# Patient Record
Sex: Female | Born: 1985 | Hispanic: Yes | Marital: Married | Smoking: Never smoker
Health system: Southern US, Community
[De-identification: ages and names within clinical notes are randomized; demographics above are authoritative.]

## PROBLEM LIST (undated history)

## (undated) DIAGNOSIS — O139 Gestational [pregnancy-induced] hypertension without significant proteinuria, unspecified trimester: Secondary | ICD-10-CM

## (undated) DIAGNOSIS — J129 Viral pneumonia, unspecified: Secondary | ICD-10-CM

## (undated) DIAGNOSIS — F32A Depression, unspecified: Secondary | ICD-10-CM

## (undated) DIAGNOSIS — R55 Syncope and collapse: Secondary | ICD-10-CM

## (undated) DIAGNOSIS — M1990 Unspecified osteoarthritis, unspecified site: Secondary | ICD-10-CM

## (undated) DIAGNOSIS — I509 Heart failure, unspecified: Secondary | ICD-10-CM

## (undated) DIAGNOSIS — E119 Type 2 diabetes mellitus without complications: Secondary | ICD-10-CM

## (undated) DIAGNOSIS — B348 Other viral infections of unspecified site: Secondary | ICD-10-CM

## (undated) DIAGNOSIS — G43909 Migraine, unspecified, not intractable, without status migrainosus: Secondary | ICD-10-CM

## (undated) DIAGNOSIS — G8929 Other chronic pain: Secondary | ICD-10-CM

## (undated) DIAGNOSIS — M549 Dorsalgia, unspecified: Secondary | ICD-10-CM

## (undated) DIAGNOSIS — Z789 Other specified health status: Secondary | ICD-10-CM

## (undated) DIAGNOSIS — G629 Polyneuropathy, unspecified: Secondary | ICD-10-CM

## (undated) DIAGNOSIS — R000 Tachycardia, unspecified: Secondary | ICD-10-CM

## (undated) HISTORY — DX: Depression, unspecified: F32.A

## (undated) HISTORY — DX: Other viral infections of unspecified site: B34.8

## (undated) HISTORY — DX: Tachycardia, unspecified: R00.0

## (undated) HISTORY — DX: Viral pneumonia, unspecified: J12.9

## (undated) HISTORY — DX: Unspecified osteoarthritis, unspecified site: M19.90

## (undated) HISTORY — DX: Syncope and collapse: R55

## (undated) HISTORY — PX: TUBAL LIGATION: SHX77

---
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
## 2006-07-10 ENCOUNTER — Inpatient Hospital Stay (HOSPITAL_COMMUNITY): Admission: AD | Admit: 2006-07-10 | Discharge: 2006-07-10 | Payer: Self-pay | Admitting: Obstetrics & Gynecology

## 2006-07-23 ENCOUNTER — Inpatient Hospital Stay (HOSPITAL_COMMUNITY): Admission: AD | Admit: 2006-07-23 | Discharge: 2006-07-23 | Payer: Self-pay | Admitting: Obstetrics & Gynecology

## 2007-08-19 HISTORY — PX: CERVICAL BIOPSY  W/ LOOP ELECTRODE EXCISION: SUR135

## 2007-08-19 HISTORY — PX: OTHER SURGICAL HISTORY: SHX169

## 2007-11-23 ENCOUNTER — Inpatient Hospital Stay (HOSPITAL_COMMUNITY): Admission: AD | Admit: 2007-11-23 | Discharge: 2007-11-23 | Payer: Self-pay | Admitting: Gynecology

## 2008-02-25 ENCOUNTER — Inpatient Hospital Stay (HOSPITAL_COMMUNITY): Admission: AD | Admit: 2008-02-25 | Discharge: 2008-02-25 | Payer: Self-pay | Admitting: Obstetrics & Gynecology

## 2008-02-28 ENCOUNTER — Ambulatory Visit (HOSPITAL_COMMUNITY): Admission: RE | Admit: 2008-02-28 | Discharge: 2008-02-28 | Payer: Self-pay | Admitting: Obstetrics & Gynecology

## 2008-03-13 ENCOUNTER — Ambulatory Visit (HOSPITAL_COMMUNITY): Admission: RE | Admit: 2008-03-13 | Discharge: 2008-03-13 | Payer: Self-pay | Admitting: Obstetrics & Gynecology

## 2008-06-05 ENCOUNTER — Other Ambulatory Visit: Admission: RE | Admit: 2008-06-05 | Discharge: 2008-06-05 | Payer: Self-pay | Admitting: Obstetrics & Gynecology

## 2008-06-23 ENCOUNTER — Inpatient Hospital Stay (HOSPITAL_COMMUNITY): Admission: AD | Admit: 2008-06-23 | Discharge: 2008-06-24 | Payer: Self-pay | Admitting: Obstetrics & Gynecology

## 2008-06-24 ENCOUNTER — Inpatient Hospital Stay (HOSPITAL_COMMUNITY): Admission: AD | Admit: 2008-06-24 | Discharge: 2008-06-25 | Payer: Self-pay | Admitting: Obstetrics and Gynecology

## 2008-06-24 ENCOUNTER — Ambulatory Visit: Payer: Self-pay | Admitting: Advanced Practice Midwife

## 2008-06-28 ENCOUNTER — Inpatient Hospital Stay (HOSPITAL_COMMUNITY): Admission: AD | Admit: 2008-06-28 | Discharge: 2008-06-28 | Payer: Self-pay | Admitting: Obstetrics & Gynecology

## 2008-07-11 ENCOUNTER — Observation Stay (HOSPITAL_COMMUNITY): Admission: RE | Admit: 2008-07-11 | Discharge: 2008-07-11 | Payer: Self-pay | Admitting: Obstetrics and Gynecology

## 2008-07-13 ENCOUNTER — Inpatient Hospital Stay (HOSPITAL_COMMUNITY): Admission: AD | Admit: 2008-07-13 | Discharge: 2008-07-15 | Payer: Self-pay | Admitting: Obstetrics and Gynecology

## 2008-07-13 ENCOUNTER — Ambulatory Visit: Payer: Self-pay | Admitting: Advanced Practice Midwife

## 2008-07-13 ENCOUNTER — Encounter: Payer: Self-pay | Admitting: Obstetrics and Gynecology

## 2008-09-14 ENCOUNTER — Encounter: Payer: Self-pay | Admitting: Obstetrics and Gynecology

## 2008-09-14 ENCOUNTER — Ambulatory Visit (HOSPITAL_COMMUNITY): Admission: RE | Admit: 2008-09-14 | Discharge: 2008-09-14 | Payer: Self-pay | Admitting: Obstetrics and Gynecology

## 2008-10-09 ENCOUNTER — Ambulatory Visit: Payer: Self-pay | Admitting: Surgery

## 2008-10-09 ENCOUNTER — Emergency Department (HOSPITAL_COMMUNITY): Admission: EM | Admit: 2008-10-09 | Discharge: 2008-10-09 | Payer: Self-pay | Admitting: Emergency Medicine

## 2008-10-09 ENCOUNTER — Encounter (INDEPENDENT_AMBULATORY_CARE_PROVIDER_SITE_OTHER): Payer: Self-pay | Admitting: Emergency Medicine

## 2009-12-02 ENCOUNTER — Emergency Department (HOSPITAL_COMMUNITY): Admission: EM | Admit: 2009-12-02 | Discharge: 2009-12-02 | Payer: Self-pay | Admitting: Emergency Medicine

## 2010-03-18 ENCOUNTER — Emergency Department (HOSPITAL_COMMUNITY): Admission: EM | Admit: 2010-03-18 | Discharge: 2010-03-18 | Payer: Self-pay | Admitting: Emergency Medicine

## 2010-03-19 ENCOUNTER — Emergency Department (HOSPITAL_COMMUNITY): Admission: EM | Admit: 2010-03-19 | Discharge: 2010-03-20 | Payer: Self-pay | Admitting: Emergency Medicine

## 2010-05-23 ENCOUNTER — Emergency Department (HOSPITAL_COMMUNITY): Admission: EM | Admit: 2010-05-23 | Discharge: 2010-05-24 | Payer: Self-pay | Admitting: Emergency Medicine

## 2010-10-30 LAB — RAPID STREP SCREEN (MED CTR MEBANE ONLY): Streptococcus, Group A Screen (Direct): NEGATIVE

## 2010-12-02 LAB — CBC
HCT: 38.3 % (ref 36.0–46.0)
Hemoglobin: 13.2 g/dL (ref 12.0–15.0)
MCHC: 34.5 g/dL (ref 30.0–36.0)
MCV: 83.4 fL (ref 78.0–100.0)
Platelets: 346 10*3/uL (ref 150–400)
RBC: 4.59 MIL/uL (ref 3.87–5.11)
RDW: 13 % (ref 11.5–15.5)
WBC: 7.6 10*3/uL (ref 4.0–10.5)

## 2010-12-02 LAB — HCG, QUANTITATIVE, PREGNANCY: hCG, Beta Chain, Quant, S: <2 m[IU]/mL (ref <5)

## 2010-12-31 NOTE — Discharge Summary (Signed)
NAMEGLENETTA, Cheryl Mercer              ACCOUNT NO.:  [id number]   MEDICAL RECORD NO.:  [id number]          PATIENT TYPE:  OBV   LOCATION:  9168                          FACILITY:  [facility]   PHYSICIAN:  Tilda Burrow, M.D. DATE OF BIRTH:  Sep 11, 1985   DATE OF ADMISSION:  07/11/2008  DATE OF DISCHARGE:  07/11/2008                               DISCHARGE SUMMARY   ADMITTING DIAGNOSES:  Gestational diabetes at 40 weeks.  Based on the  third trimester ultrasound, unknown last menstrual period.   DISCHARGE DIAGNOSES:  Pregnancy at 37 weeks 6 days by 20-week ultrasound  and cancellation of induction.   HOSPITAL SUMMARY:  This 25 year old female was admitted on July 11, 2008, at 40 weeks' gestation by the limited ultrasound criteria  available at [facility] dating on a 30-week ultrasound in the  absence of any ultrasound, any menstrual criteria to use and the absence  of early prenatal care.   The patient was sent to Labor and Delivery where exam noted term size  fetus with cervical exam 3 cm, 70%, -2, cervix soft, vertex  presentation.  See HPI for details.  The patient had a chart review,  which revealed a 20-week ultrasound at [facility], which would  place her 37 weeks 6 days.  Given the potential possible prematurity  that would be attributable to iatrogenic causes for induction at this  time.  We have cancelled plans for induction and will reassess the  patient in 1-week.  The patient was discharged home with followup  instructions, [facility] within 1 week.       Tilda Burrow, M.D.  Electronically Signed     JVF/MEDQ  D:  08/30/2008  T:  08/31/2008  Job:  [id number]

## 2010-12-31 NOTE — Op Note (Signed)
Cheryl Mercer, Cheryl Mercer              ACCOUNT NO.:  [id number]   MEDICAL RECORD NO.:  [id number]          PATIENT TYPE:  AMB   LOCATION:  DAY                           FACILITY:  [facility]   PHYSICIAN:  Tilda Burrow, M.D. DATE OF BIRTH:  04/29/1986   DATE OF PROCEDURE:  09/14/2008  DATE OF DISCHARGE:                               OPERATIVE REPORT   PREOPERATIVE DIAGNOSES:  Elective sterilization, cervical dysplasia  cervical intraepithelial neoplasia 2.   POSTOPERATIVE DIAGNOSES:  Elective sterilization, cervical dysplasia  cervical intraepithelial neoplasia 2.   PROCEDURE:  1. Laparoscopic tubal sterilization with Falope rings.  2. Loop electrocautery excision procedure conization of the cervix.   SURGEON:  Tilda Burrow, M.D.   ASSISTANTAmie Critchley, CST   ANESTHESIA:  General.   COMPLICATIONS:  None.   FINDINGS:  Normal tubes bilaterally, small cervix, and small transition  zone on the cervix.   DESCRIPTION OF PROCEDURE:  The patient was taken to the operating room,  prepped and draped in the usual standard fashion with legs in low  lithotomy leg supports after general anesthesia was introduced without  difficulty.  The bladder was in-and-out catheterized and Hulka tenaculum  attached to the cervix for uterine manipulation.  An infraumbilical,  vertical, 1-cm skin incision was made as well as a transverse suprapubic  1-cm incision.  A Veress needle was used to achieve pneumoperitoneum  through the umbilical incision while being careful to orient the needle  toward the pelvis while elevating the abdominal wall by manual  elevation.  Water droplet test was used to confirm intraperitoneal  placement.   Pneumoperitoneum was achieved easily under 8-to-10 mm of intra-abdominal  pressure; and the laparoscopic trocar was introduced, a 5-mm blunt  tipped trocar, under direct visualization using the video camera.  Peritoneal cavity was entered without difficulty.  Inspection of  the  anterior surfaces of the abdominal contents showed no evidence of injury  or bleeding.  Attention was directed to the pelvis.  Findings were as  described above.   Attention was first directed to the left fallopian tube which was  elevated, identified to its fimbriated end and grasped in its midportion  with Falope ring applier.  Falope ring applied and then the tube  infiltrated with Marcaine solution 0.25% using a 22-gauge spinal needle  percutaneously applied.   Attention was then directed to the right fallopian tube where a similar  procedure was performed.  Photo documentation of the ring placements was  performed; 120 cc of saline was instilled into the abdomen; deflation of  CO2 performed; instruments removed and subcuticular 4-0 Dexon closure of  skin incisions performed.  The rest of the surgical instruments were  removed; Steri-Strips placed.  The patient allowed to awaken and go to  recovery room in standard fashion.   ADDENDUM  LEEP conization.   Prior to allowing the patient to awaken, we removed all the laparoscopic  equipment out of the way, a speculum was inserted into the vagina along  visualization of the cervix, which was created with Lugol's solution to  identify the squamocolumnar junction.  Paracervical  block with Marcaine  solution was injected x 5 mL on each side, then the cervix trimmed with  a 2-cm x 0.8-cm wire LEEP conization device under a cautery setting at  40 watts, removing the cervical specimen in one swipe.  The posterior  lip was deeply trimmed, as the anterior portion was but specimen was  sent intact in one piece.  A small amount of bleeding was controlled  with Monsel solution.  The patient went to recovery room in good  condition.      Tilda Burrow, M.D.  Electronically Signed     JVF/MEDQ  D:  09/14/2008  T:  09/15/2008  Job:  [id number]   cc:   [facility]

## 2010-12-31 NOTE — H&P (Signed)
NAMETRUTH, WOLAVER              ACCOUNT NO.:  [id number]   MEDICAL RECORD NO.:  [id number]          PATIENT TYPE:  AMB   LOCATION:  DAY                           FACILITY:  [facility]   PHYSICIAN:  Tilda Burrow, M.D. 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   HISTORY OF PRESENT ILLNESS:  This 25 year old female gravida 4, para 4  status post vaginal delivery in November has been followed up in our  office.  She has had colposcopic evaluation of her previously noted  abnormal Pap smear and the cervical biopsies of the posterior lip of the  cervix have shown moderate dysplasia of the posterior lip of the cervix.  Endocervical curettings were benign.   Ms.  Iran Planas has already requested permanent sterilization.  She signed  Medicaid tubal sterilization forms in November after turning age 48, has  confirmed once again her desire for permanent sterilization.  She has  overcome her anxiety about being put to sleep, and desires to proceed at  this time, having spoken to someone who has recently undergone similar  surgery and did beautifully and reassured her of the easy anesthetic  experience.  The patient desires to proceed with sterilization.  She  understands it is a permanent procedure and intended to be irreversible.   PAST MEDICAL HISTORY:  benign.   SURGICAL HISTORY:  Negative.   ALLERGIES:  None.   SOCIAL HISTORY:  Single, lives with children.  Current partner is Earley Abide.   PHYSICAL EXAMINATION:  VITAL SIGNS:  Height 5 feet 1 inch, weight 188.6,  blood pressure 150/82.  HEENT:  Pupils equal, round, reactive.  Extraocular intact.  NECK:  Supple.  CHEST:  Clear to auscultation.  ABDOMEN:  Nontender, moderate obesity present.  EXTERNAL GENITALIA:  Normal  female, postpartum state, uterus anteflexed,  normal size, shape and contour without masses.  EXTREMITIES: Normal.   PLAN:  LPS Falope rings September 14, 2008, preop laboratory evaluation  and paperwork at 3:15 p.m. on September 12, 2008.      Tilda Burrow, M.D.  Electronically Signed     JVF/MEDQ  D:  09/07/2008  T:  09/07/2008  Job:  [id number]   cc:   [facility]

## 2011-01-03 NOTE — Group Therapy Note (Signed)
   NAME:  Cheryl Mercer, Cheryl Mercer                        ACCOUNT NO.:  [id number]   MEDICAL RECORD NO.:  [id number]                   PATIENT TYPE:  OUT   LOCATION:  [facility]                           FACILITY:  [facility]   PHYSICIAN:  Argentina Donovan, M.D.                   DATE OF BIRTH:  Dec 27, 1985   DATE OF SERVICE:  02/16/2003                                    CLINIC NOTE   HISTORY OF PRESENT ILLNESS:  The patient is a 25 year old gravida 1 para 1-0-  0-1 with baby 51 months old who comes in having gained 30 pounds since  delivery and now weighs 172, who had her May period come two weeks late and  has missed her June period, and thinks she could be pregnant although she  has no signs of breast tenderness or nausea or any other signs of pregnancy  except for a missed period.  Urine pregnancy test is negative.   PHYSICAL EXAMINATION:  PELVIC:  External genitalia is normal.  BUS is within  normal limits.  The vagina is clean and well rugated.  The cervix is clean  and parous and the uterus is of normal size.  The adnexa could not be  outlined because of the habitus of the patient.  BREASTS:  The breasts are soft with no dominant masses.  NECK:  Thyroid is normal with no significant nodules.   We discussed with the patient the use of contraception.  She desires Depo-  Provera which will be given before her discharge today.  Also, she had a  foamy white vaginal discharge.  A wet prep was done which was negative,  whiff test negative for yeast, negative for clue cells, and negative for  trichomonas.   IMPRESSION:  1. Normal gynecologic exam.  2. Amenorrhea.  3. To be put on Depo-Provera.                                               Argentina Donovan, M.D.    PR/MEDQ  D:  02/16/2003  T:  02/16/2003  Job:  [id number]

## 2011-01-03 NOTE — Discharge Summary (Signed)
Cheryl Mercer, Cheryl Mercer              ACCOUNT NO.:  [id number]   MEDICAL RECORD NO.:  [id number]          PATIENT TYPE:  OBV   LOCATION:  9165                          [facility]ACILITY[facility]   PHYSICIAN:  Phil D. Okey Dupre, M.D.     DATE OF BIRTH:  June 12, 1986   DATE OF ADMISSION:  02/26/2005  DATE OF DISCHARGE:  02/27/2005                                 DISCHARGE SUMMARY   ADMISSION DIAGNOSIS:  Contractions.   DISCHARGE DIAGNOSIS:  Intrauterine pregnancy at 37 weeks and 1 day.   PROCEDURE:  None.   HISTORY OF PRESENT ILLNESS:  This is a 25 year old female gravida 2, para 1-  0-0-1, at 37 weeks and 1 day.  The patient presented to the MAU with  irregular contractions.  She was 3 to 4 cm/50%/-2 to -3.  The patient was  initially walked for 2 hours and was reexamined.  The patient's cervix had  not changed, but the patient insisted that she be followed.   HOSPITAL COURSE:  The patient was admitted to the hospital for further  observation.  The patient's cervix did not change while in the hospital.  At  discharge, her cervix was still 3 to 4/50/-3.  The patient was not in labor.  The patient was discharged home with labor precautions and told to keep her  appointment with [facility].  The baby's strip was reassuring at all  times during the hospital stay.  The patient voiced understanding of plan.   DISCHARGE LABORATORY DATA:  None.   DISCHARGE MEDICATIONS:  None.   ALLERGIES:  No known drug allergies.       HG/MEDQ  D:  02/27/2005  T:  02/27/2005  Job:  [id number]

## 2011-04-21 ENCOUNTER — Emergency Department (HOSPITAL_COMMUNITY)
Admission: EM | Admit: 2011-04-21 | Discharge: 2011-04-21 | Disposition: A | Discharge status: Home / Self Care | Payer: Self-pay | Attending: Emergency Medicine | Admitting: Emergency Medicine

## 2011-04-21 ENCOUNTER — Emergency Department (HOSPITAL_COMMUNITY): Payer: Self-pay

## 2011-04-21 DIAGNOSIS — H9209 Otalgia, unspecified ear: Secondary | ICD-10-CM | POA: Insufficient documentation

## 2011-04-21 DIAGNOSIS — R221 Localized swelling, mass and lump, neck: Secondary | ICD-10-CM | POA: Insufficient documentation

## 2011-04-21 DIAGNOSIS — R6889 Other general symptoms and signs: Secondary | ICD-10-CM | POA: Insufficient documentation

## 2011-04-21 DIAGNOSIS — R509 Fever, unspecified: Secondary | ICD-10-CM | POA: Insufficient documentation

## 2011-04-21 DIAGNOSIS — R220 Localized swelling, mass and lump, head: Secondary | ICD-10-CM | POA: Insufficient documentation

## 2011-04-21 DIAGNOSIS — R070 Pain in throat: Secondary | ICD-10-CM | POA: Insufficient documentation

## 2011-04-21 DIAGNOSIS — R0989 Other specified symptoms and signs involving the circulatory and respiratory systems: Secondary | ICD-10-CM | POA: Insufficient documentation

## 2011-04-21 DIAGNOSIS — R0609 Other forms of dyspnea: Secondary | ICD-10-CM | POA: Insufficient documentation

## 2011-04-21 DIAGNOSIS — M542 Cervicalgia: Secondary | ICD-10-CM | POA: Insufficient documentation

## 2011-04-21 DIAGNOSIS — J36 Peritonsillar abscess: Secondary | ICD-10-CM | POA: Insufficient documentation

## 2011-04-21 DIAGNOSIS — R599 Enlarged lymph nodes, unspecified: Secondary | ICD-10-CM | POA: Insufficient documentation

## 2011-04-21 DIAGNOSIS — R630 Anorexia: Secondary | ICD-10-CM | POA: Insufficient documentation

## 2011-04-21 LAB — CBC
HCT: 34.4 % — ABNORMAL LOW (ref 36.0–46.0)
Hemoglobin: 12.1 g/dL (ref 12.0–15.0)
MCH: 29.2 pg (ref 26.0–34.0)
MCHC: 35.2 g/dL (ref 30.0–36.0)
MCV: 82.9 fL (ref 78.0–100.0)
RDW: 12 % (ref 11.5–15.5)
WBC: 18.7 10*3/uL — ABNORMAL HIGH (ref 4.0–10.5)

## 2011-04-21 LAB — POCT I-STAT, CHEM 8
Calcium, Ion: 1.09 mmol/L — ABNORMAL LOW (ref 1.12–1.32)
Chloride: 102 mEq/L (ref 96–112)
Creatinine, Ser: 0.5 mg/dL (ref 0.50–1.10)
Glucose, Bld: 207 mg/dL — ABNORMAL HIGH (ref 70–99)
HCT: 36 % (ref 36.0–46.0)
Hemoglobin: 12.2 g/dL (ref 12.0–15.0)
Potassium: 3.1 mEq/L — ABNORMAL LOW (ref 3.5–5.1)
Sodium: 138 mEq/L (ref 135–145)

## 2011-04-21 LAB — DIFFERENTIAL
Basophils Absolute: 0 10*3/uL (ref 0.0–0.1)
Basophils Relative: 0 % (ref 0–1)
Eosinophils Absolute: 0.5 10*3/uL (ref 0.0–0.7)
Eosinophils Relative: 3 % (ref 0–5)
Lymphs Abs: 3.3 10*3/uL (ref 0.7–4.0)
Monocytes Absolute: 1.5 10*3/uL — ABNORMAL HIGH (ref 0.1–1.0)
Monocytes Relative: 8 % (ref 3–12)
Neutro Abs: 13.4 10*3/uL — ABNORMAL HIGH (ref 1.7–7.7)
Neutrophils Relative %: 72 % (ref 43–77)

## 2011-04-21 LAB — RAPID STREP SCREEN (MED CTR MEBANE ONLY): Streptococcus, Group A Screen (Direct): POSITIVE — AB

## 2011-04-21 LAB — POCT PREGNANCY, URINE: Preg Test, Ur: NEGATIVE

## 2011-04-21 MED ORDER — IOHEXOL 300 MG/ML  SOLN: 75.0000 mL | Freq: Once | INTRAMUSCULAR | Status: DC | PRN

## 2011-05-13 LAB — URINALYSIS, ROUTINE W REFLEX MICROSCOPIC
Bilirubin Urine: NEGATIVE
Glucose, UA: NEGATIVE
Hgb urine dipstick: NEGATIVE
Ketones, ur: NEGATIVE
Nitrite: NEGATIVE
Protein, ur: NEGATIVE
Specific Gravity, Urine: 1.02
Urobilinogen, UA: 0.2
pH: 5.5

## 2011-05-13 LAB — WET PREP, GENITAL
Trich, Wet Prep: NONE SEEN
Yeast Wet Prep HPF POC: NONE SEEN

## 2011-05-13 LAB — POCT PREGNANCY, URINE
Operator id: 251141
Preg Test, Ur: POSITIVE

## 2011-05-13 LAB — GC/CHLAMYDIA PROBE AMP, GENITAL
Chlamydia, DNA Probe: NEGATIVE
GC Probe Amp, Genital: NEGATIVE

## 2011-05-15 LAB — GC/CHLAMYDIA PROBE AMP, GENITAL
Chlamydia, DNA Probe: NEGATIVE
GC Probe Amp, Genital: NEGATIVE

## 2011-05-15 LAB — URINALYSIS, ROUTINE W REFLEX MICROSCOPIC
Glucose, UA: NEGATIVE
Ketones, ur: 15 — AB
Nitrite: POSITIVE — AB
Protein, ur: NEGATIVE

## 2011-05-15 LAB — URINE MICROSCOPIC-ADD ON

## 2011-05-15 LAB — WET PREP, GENITAL: Clue Cells Wet Prep HPF POC: NONE SEEN

## 2011-05-20 LAB — CBC
HCT: 32.3 — ABNORMAL LOW
HCT: 36.7
Hemoglobin: 11.7 — ABNORMAL LOW
MCHC: 33.9
MCHC: 34.3
MCHC: 34.8
MCV: 85.9
MCV: 86.5
MCV: 86.8
Platelets: 196
Platelets: 234
RDW: 14.1
RDW: 14.2
RDW: 14.3

## 2011-05-20 LAB — GLUCOSE, CAPILLARY: Glucose-Capillary: 90

## 2011-05-20 LAB — RPR: RPR Ser Ql: NONREACTIVE

## 2011-09-18 ENCOUNTER — Encounter (HOSPITAL_COMMUNITY): Payer: Self-pay | Admitting: Emergency Medicine

## 2011-09-18 ENCOUNTER — Emergency Department (HOSPITAL_COMMUNITY)
Admission: EM | Admit: 2011-09-18 | Discharge: 2011-09-19 | Disposition: A | Discharge status: Home / Self Care | Payer: Medicaid Other | Attending: Emergency Medicine | Admitting: Emergency Medicine

## 2011-09-18 DIAGNOSIS — G8929 Other chronic pain: Secondary | ICD-10-CM | POA: Insufficient documentation

## 2011-09-18 DIAGNOSIS — M545 Low back pain, unspecified: Secondary | ICD-10-CM | POA: Insufficient documentation

## 2011-09-18 DIAGNOSIS — N760 Acute vaginitis: Secondary | ICD-10-CM | POA: Insufficient documentation

## 2011-09-18 DIAGNOSIS — A499 Bacterial infection, unspecified: Secondary | ICD-10-CM | POA: Insufficient documentation

## 2011-09-18 DIAGNOSIS — N390 Urinary tract infection, site not specified: Secondary | ICD-10-CM | POA: Insufficient documentation

## 2011-09-18 DIAGNOSIS — B9689 Other specified bacterial agents as the cause of diseases classified elsewhere: Secondary | ICD-10-CM | POA: Insufficient documentation

## 2011-09-18 LAB — DIFFERENTIAL
Basophils Relative: 0 % (ref 0–1)
Lymphs Abs: 4.5 10*3/uL — ABNORMAL HIGH (ref 0.7–4.0)
Monocytes Absolute: 0.6 10*3/uL (ref 0.1–1.0)
Monocytes Relative: 5 % (ref 3–12)
Neutro Abs: 6.5 10*3/uL (ref 1.7–7.7)
Neutrophils Relative %: 55 % (ref 43–77)

## 2011-09-18 LAB — URINALYSIS, ROUTINE W REFLEX MICROSCOPIC
Bilirubin Urine: NEGATIVE
Specific Gravity, Urine: 1.015 (ref 1.005–1.030)
pH: 7.5 (ref 5.0–8.0)

## 2011-09-18 LAB — BASIC METABOLIC PANEL
BUN: 6 mg/dL (ref 6–23)
Chloride: 98 mEq/L (ref 96–112)
GFR calc Af Amer: >90 mL/min (ref >90)
GFR calc non Af Amer: >90 mL/min (ref >90)
Glucose, Bld: 201 mg/dL — ABNORMAL HIGH (ref 70–99)
Potassium: 3.8 mEq/L (ref 3.5–5.1)
Sodium: 135 mEq/L (ref 135–145)

## 2011-09-18 LAB — CBC
HCT: 37.1 % (ref 36.0–46.0)
Hemoglobin: 13.4 g/dL (ref 12.0–15.0)
MCH: 30 pg (ref 26.0–34.0)
MCHC: 36.1 g/dL — ABNORMAL HIGH (ref 30.0–36.0)
RBC: 4.46 MIL/uL (ref 3.87–5.11)

## 2011-09-18 LAB — WET PREP, GENITAL: Trich, Wet Prep: NONE SEEN

## 2011-09-18 LAB — URINE MICROSCOPIC-ADD ON

## 2011-09-18 MED ORDER — AZITHROMYCIN 250 MG PO TABS
1000.0000 mg | ORAL_TABLET | Freq: Once | ORAL | Status: AC
Start: 2011-09-18 — End: 2011-09-18
  Administered 2011-09-18: 1000 mg via ORAL
  Filled 2011-09-18: qty 4

## 2011-09-18 MED ORDER — ONDANSETRON HCL 4 MG/2ML IJ SOLN
4.0000 mg | Freq: Once | INTRAMUSCULAR | Status: AC
  Administered 2011-09-18: 4 mg via INTRAVENOUS
  Filled 2011-09-18: qty 2

## 2011-09-18 MED ORDER — LIDOCAINE HCL (PF) 1 % IJ SOLN
INTRAMUSCULAR | Status: AC
  Administered 2011-09-18
  Filled 2011-09-18: qty 5

## 2011-09-18 MED ORDER — SODIUM CHLORIDE 0.9 % IV BOLUS (SEPSIS)
1000.0000 mL | Freq: Once | INTRAVENOUS | Status: AC
  Administered 2011-09-18: 1000 mL via INTRAVENOUS

## 2011-09-18 MED ORDER — KETOROLAC TROMETHAMINE 30 MG/ML IJ SOLN
30.0000 mg | Freq: Once | INTRAMUSCULAR | Status: AC
  Administered 2011-09-18: 30 mg via INTRAVENOUS
  Filled 2011-09-18: qty 1

## 2011-09-18 MED ORDER — CEFTRIAXONE SODIUM 250 MG IJ SOLR
250.0000 mg | Freq: Once | INTRAMUSCULAR | Status: AC
  Administered 2011-09-18: 250 mg via INTRAMUSCULAR
  Filled 2011-09-18: qty 250

## 2011-09-18 NOTE — ED Provider Notes (Signed)
History     CSN: [id number]  Arrival date & time 09/18/11  2038   First MD Initiated Contact with Patient 09/18/11 2214      Chief Complaint  Patient presents with  . Back Pain    (Consider location/radiation/quality/duration/timing/severity/associated sxs/prior treatment) HPI Comments: Patient comes in complaining of lower back pain.  She reports that she has had this pain for years.  Pain located over the lumbar spine.  Pain does not radiate.  She reports that she has had negative xrays in the past.  She has taken Tylenol for the pain, but does not feel that it helps. She also reports that she has noticed some vaginal discharge over the past 2 days.  She reports that she has been diagnosed with Chlamydia in the past.  She is currently having unprotected sex with her partner.  Patient is a 26 y.o. female presenting with back pain. The history is provided by the patient.  Back Pain  This is a recurrent problem. The pain is associated with no known injury. The pain is present in the lumbar spine. The pain does not radiate. The symptoms are aggravated by bending. Pertinent negatives include no fever, no numbness, no bowel incontinence, no perianal numbness, no bladder incontinence, no dysuria, no leg pain, no paresis, no tingling and no weakness.    History reviewed. No pertinent past medical history.  History reviewed. No pertinent past surgical history.  No family history on file.  History  Substance Use Topics  . Smoking status: Never Smoker   . Smokeless tobacco: Not on file  . Alcohol Use: No    OB History    Grav Para Term Preterm Abortions TAB SAB Ect Mult Living                  Review of Systems  Constitutional: Negative for fever and chills.  HENT: Negative for neck pain.   Respiratory: Negative for wheezing.   Gastrointestinal: Negative for nausea, vomiting and bowel incontinence.  Genitourinary: Positive for frequency. Negative for bladder incontinence,  dysuria, hematuria, flank pain, difficulty urinating and dyspareunia.  Musculoskeletal: Positive for back pain. Negative for gait problem.  Skin: Negative for rash.  Neurological: Negative for tingling, syncope, weakness and numbness.    Allergies  Review of patient's allergies indicates no known allergies.  Home Medications   Current Outpatient Rx  Name Route Sig Dispense Refill  . ACETAMINOPHEN 500 MG PO TABS Oral Take 1,000 mg by mouth every 6 (six) hours as needed. For pain      BP 137/82  Pulse 98  Temp(Src) 98.5 F (36.9 C) (Oral)  Resp 18  SpO2 98%  LMP 08/18/2011  Physical Exam  Nursing note and vitals reviewed. Constitutional: She is oriented to person, place, and time. She appears well-developed and well-nourished. No distress.  Cardiovascular: Normal rate, regular rhythm and normal heart sounds.   Pulmonary/Chest: Effort normal and breath sounds normal.  Abdominal: Normal appearance and bowel sounds are normal. There is tenderness in the suprapubic area. There is no CVA tenderness.  Genitourinary: There is no lesion on the right labia. There is no lesion on the left labia. Uterus is not tender. Cervix exhibits discharge. Cervix exhibits no motion tenderness. Right adnexum displays no mass and no tenderness. Left adnexum displays no mass and no tenderness.       Greenish/yellowish cervical discharge.  Musculoskeletal:       Lumbar back: She exhibits bony tenderness.  Neurological: She is alert and oriented  to person, place, and time. She has normal strength and normal reflexes. No sensory deficit. Gait normal.  Skin: Skin is warm and dry. She is not diaphoretic.  Psychiatric: She has a normal mood and affect.    ED Course  Procedures (including critical care time)  Labs Reviewed  URINALYSIS, ROUTINE W REFLEX MICROSCOPIC - Abnormal; Notable for the following:    APPearance CLOUDY (*)    Hgb urine dipstick TRACE (*)    Leukocytes, UA LARGE (*)    All other  components within normal limits  BASIC METABOLIC PANEL - Abnormal; Notable for the following:    Glucose, Bld 201 (*)    Creatinine, Ser 0.38 (*)    All other components within normal limits  CBC - Abnormal; Notable for the following:    WBC 11.8 (*)    MCHC 36.1 (*)    All other components within normal limits  DIFFERENTIAL - Abnormal; Notable for the following:    Lymphs Abs 4.5 (*)    All other components within normal limits  URINE MICROSCOPIC-ADD ON - Abnormal; Notable for the following:    Squamous Epithelial / LPF MANY (*)    Bacteria, UA MANY (*)    All other components within normal limits  POCT PREGNANCY, URINE  GC/CHLAMYDIA PROBE AMP, GENITAL  WET PREP, GENITAL   No results found.   1. UTI (lower urinary tract infection)   2. Bacterial vaginosis   3. Chronic lower back pain       MDM  Patient with UA showing a UTI.  Patient also reports having unprotected sex and has had vaginal discharge.  PMH significant for Chlamydia.  Therefore,  Patient was treated for GC/Chlamydia while in the ED with Azithromycin and Ceftriaxone.  Patient will be discharged home with Macrobid for UTI.  Patient in agreement with plan.  Patient also given resource guide to follow up with a primary care physician.  Patient also having back pain.  Pain is chronic.  No CVA tenderness.  No red flags of back pain.  Patient instructed to follow up with PCP and given prescription for Ultram.        Magnus Sinning, PA-C 09/19/11 0130

## 2011-09-18 NOTE — ED Notes (Signed)
PT. REPORTS LOW BACK/LOW ABDOMINAL PAIN FOR 4 DAYS WITH HEADACHE AND NAUSEA , DENIES VOMITTING , DIARRHEA , VAGINAL DISCHARGE OR FEVER.

## 2011-09-19 MED ORDER — NITROFURANTOIN MONOHYD MACRO 100 MG PO CAPS: 100.0000 mg | ORAL_CAPSULE | Freq: Two times a day (BID) | ORAL | Status: AC

## 2011-09-19 MED ORDER — METRONIDAZOLE 500 MG PO TABS: 500.0000 mg | ORAL_TABLET | Freq: Two times a day (BID) | ORAL | Status: AC

## 2011-09-19 MED ORDER — TRAMADOL HCL 50 MG PO TABS: 50.0000 mg | ORAL_TABLET | Freq: Four times a day (QID) | ORAL | Status: AC | PRN

## 2011-09-19 NOTE — ED Notes (Signed)
Pt denies any questions and reports decrease in pain upon discharge. 

## 2011-09-20 LAB — GC/CHLAMYDIA PROBE AMP, GENITAL: GC Probe Amp, Genital: NEGATIVE

## 2011-09-20 NOTE — ED Provider Notes (Signed)
Medical screening examination/treatment/procedure(s) were performed by non-physician practitioner and as supervising physician I was immediately available for consultation/collaboration.   Gerhard Munch, MD 09/20/11 [phone]

## 2011-11-04 ENCOUNTER — Inpatient Hospital Stay (HOSPITAL_COMMUNITY)
Admission: AD | Admit: 2011-11-04 | Discharge: 2011-11-04 | Disposition: A | Discharge status: Home / Self Care | Payer: Self-pay | Source: Ambulatory Visit | Attending: Obstetrics and Gynecology | Admitting: Obstetrics and Gynecology

## 2011-11-04 ENCOUNTER — Encounter (HOSPITAL_COMMUNITY): Payer: Self-pay | Admitting: *Deleted

## 2011-11-04 DIAGNOSIS — N946 Dysmenorrhea, unspecified: Secondary | ICD-10-CM | POA: Insufficient documentation

## 2011-11-04 HISTORY — DX: Other specified health status: Z78.9

## 2011-11-04 LAB — URINE MICROSCOPIC-ADD ON

## 2011-11-04 LAB — URINALYSIS, ROUTINE W REFLEX MICROSCOPIC
Glucose, UA: NEGATIVE mg/dL
Ketones, ur: NEGATIVE mg/dL
Leukocytes, UA: NEGATIVE
Specific Gravity, Urine: >1.03 — ABNORMAL HIGH (ref 1.005–1.030)
pH: 5.5 (ref 5.0–8.0)

## 2011-11-04 LAB — HCG, SERUM, QUALITATIVE: Preg, Serum: NEGATIVE

## 2011-11-04 LAB — POCT PREGNANCY, URINE: Preg Test, Ur: NEGATIVE

## 2011-11-04 NOTE — MAU Provider Note (Signed)
Chief Complaint:  Abdominal Pain    First Provider Initiated Contact with Patient 11/04/11 1109      Cheryl Mercer is  26 y.o. G55P0.  Patient's last menstrual period was 09/03/2010..    She presents complaining of Abdominal Pain . Pt presents for confirmation of pregnancy. Reports tubal sterilization in 2010 by Dr. Emelda Fear, however, + pregnancy test at home yesterday. Pt brought test with her to MAU and it is in fact a + results. States menstrual-like cramping on Friday c/w expected period, period-like bleeding started on Saturday but only lasted one day. Intermittent cramping since.   Obstetrical/Gynecological History: OB History    Grav Para Term Preterm Abortions TAB SAB Ect Mult Living   4         4      Past Medical History: Past Medical History  Diagnosis Date  . No pertinent past medical history     Past Surgical History: History reviewed. No pertinent past surgical history.  Family History: Family History  Problem Relation Age of Onset  . Anesthesia problems Neg Hx     Social History: History  Substance Use Topics  . Smoking status: Never Smoker   . Smokeless tobacco: Never Used  . Alcohol Use: 3.6 oz/week    6 Shots of liquor per week     last time was 4 months ago, occassionly    Allergies: No Known Allergies  Prescriptions prior to admission  Medication Sig Dispense Refill  . acetaminophen (TYLENOL) 500 MG tablet Take 1,000 mg by mouth every 6 (six) hours as needed. For pain      . ibuprofen (ADVIL,MOTRIN) 100 MG tablet Take 100 mg by mouth every 6 (six) hours as needed. For cramps.        Review of Systems - Negative except what has been reviewed in the HPI  Physical Exam   Blood pressure 156/91, pulse 87, temperature 99 F (37.2 C), temperature source Oral, resp. rate 20, height 4\' 9"  (1.448 m), weight 85.73 kg (189 lb), last menstrual period 09/03/2010, SpO2 100.00%, unknown if currently breastfeeding.  General: General appearance - alert,  well appearing, and in no distress, oriented to person, place, and time and overweight Mental status - alert, oriented to person, place, and time, normal mood, behavior, speech, dress, motor activity, and thought processes, affect appropriate to mood Abdomen - soft, nontender, nondistended, no masses or organomegaly Focused Gynecological Exam: examination not indicated  Labs: Recent Results (from the past 24 hour(s))  PREGNANCY, URINE   Collection Time   11/04/11 10:08 AM      Component Value Range   Preg Test, Ur NEGATIVE  NEGATIVE   URINALYSIS, ROUTINE W REFLEX MICROSCOPIC   Collection Time   11/04/11 10:08 AM      Component Value Range   Color, Urine YELLOW  YELLOW    APPearance CLEAR  CLEAR    Specific Gravity, Urine >1.030 (*) 1.005 - 1.030    pH 5.5  5.0 - 8.0    Glucose, UA NEGATIVE  NEGATIVE (mg/dL)   Hgb urine dipstick SMALL (*) NEGATIVE    Bilirubin Urine NEGATIVE  NEGATIVE    Ketones, ur NEGATIVE  NEGATIVE (mg/dL)   Protein, ur NEGATIVE  NEGATIVE (mg/dL)   Urobilinogen, UA 0.2  0.0 - 1.0 (mg/dL)   Nitrite NEGATIVE  NEGATIVE    Leukocytes, UA NEGATIVE  NEGATIVE   URINE MICROSCOPIC-ADD ON   Collection Time   11/04/11 10:08 AM      Component Value  Range   Squamous Epithelial / LPF RARE  RARE    WBC, UA 0-2  <3 (WBC/hpf)   RBC / HPF 3-6  <3 (RBC/hpf)   Bacteria, UA FEW (*) RARE    Urine-Other MUCOUS PRESENT    POCT PREGNANCY, URINE   Collection Time   11/04/11 10:47 AM      Component Value Range   Preg Test, Ur NEGATIVE  NEGATIVE   HCG, SERUM, QUALITATIVE   Collection Time   11/04/11 11:02 AM      Component Value Range   Preg, Serum NEGATIVE  NEGATIVE     Assessment: Dysmenorrhea False-positive pregnancy test  Plan: Discharge home NSAIDs prn cramping  Michai Dieppa E. 11/04/2011,11:45 AM

## 2011-11-04 NOTE — MAU Provider Note (Signed)
Agree with above note.  Cheryl Mercer 11/04/2011 12:50 PM

## 2011-11-04 NOTE — Discharge Instructions (Signed)
Dysmenorrhea Menstrual pain is caused by the muscles of the uterus tightening (contracting) during a menstrual period. The muscles of the uterus contract due to the chemicals in the uterine lining. Primary dysmenorrhea is menstrual cramps that last a couple of days when you start having menstrual periods or soon after. This often begins after a teenager starts having her period. As a woman gets older or has a baby, the cramps will usually lesson or disappear. Secondary dysmenorrhea begins later in life, lasts longer, and the pain may be stronger than primary dysmenorrhea. The pain may start before the period and last a few days after the period. This type of dysmenorrhea is usually caused by an underlying problem such as:  The tissue lining the uterus grows outside of the uterus in other areas of the body (endometriosis).   The endometrial tissue, which normally lines the uterus, is found in or grows into the muscular walls of the uterus (adenomyosis).   The pelvic blood vessels are engorged with blood just before the menstrual period (pelvic congestive syndrome).   Overgrowth of cells in the lining of the uterus or cervix (polyps of the uterus or cervix).   Falling down of the uterus (prolapse) because of loose or stretched ligaments.   Depression.   Bladder problems, infection, or inflammation.   Problems with the intestine, a tumor, or irritable bowel syndrome.   Cancer of the female organs or bladder.   A severely tipped uterus.   A very tight opening or closed cervix.   Noncancerous tumors of the uterus (fibroids).   Pelvic inflammatory disease (PID).   Pelvic scarring (adhesions) from a previous surgery.   Ovarian cyst.   An intrauterine device (IUD) used for birth control.  CAUSES  The cause of menstrual pain is often unknown. SYMPTOMS   Cramping or throbbing pain in your lower abdomen.   Sometimes, a woman may also experience headaches.   Lower back pain.    Feeling sick to your stomach (nausea) or vomiting.   Diarrhea.   Sweating or dizziness.  DIAGNOSIS  A diagnosis is based on your history, symptoms, physical examination, diagnostic tests, or procedures. Diagnostic tests or procedures may include:  Blood tests.   An ultrasound.   An examination of the lining of the uterus (dilation and curettage, D&C).   An examination inside your abdomen or pelvis with a scope (laparoscopy).   X-rays.   CT Scan.   MRI.   An examination inside the bladder with a scope (cystoscopy).   An examination inside the intestine or stomach with a scope (colonoscopy, gastroscopy).  TREATMENT  Treatment depends on the cause of the dysmenorrhea. Treatment may include:  Pain medicine prescribed by your caregiver.   Birth control pills.   Hormone replacement therapy.   Nonsteroidal anti-inflammatory drugs (NSAIDs). These may help stop the production of prostaglandins.   An IUD with progesterone hormone in it.   Acupuncture.   Surgery to remove adhesions, endometriosis, ovarian cyst, or fibroids.   Removal of the uterus (hysterectomy).   Progesterone shots to stop the menstrual period.   Cutting the nerves on the sacrum that go to the female organs (presacral neurectomy).   Electric currant to the sacral nerves (sacral nerve stimulation).   Antidepressant medicine.   Psychiatric therapy, counseling, or group therapy.   Exercise and physical therapy.   Meditation and yoga therapy.  HOME CARE INSTRUCTIONS   Only take over-the-counter or prescription medicines for pain, discomfort, or fever as directed by your   caregiver.   Place a heating pad or hot water bottle on your lower back or abdomen. Do not sleep with the heating pad.   Use aerobic exercises, walking, swimming, biking, and other exercises to help lessen the cramping.   Massage to the lower back or abdomen may help.   Stop smoking.   Avoid alcohol and caffeine.   Yoga,  meditation, or acupuncture may help.  SEEK MEDICAL CARE IF:   The pain does not get better with medicine.   You have pain with sexual intercourse.  SEEK IMMEDIATE MEDICAL CARE IF:   Your pain increases and is not controlled with medicines.   You have a fever.   You develop nausea or vomiting with your period not controlled with medicine.   You have abnormal vaginal bleeding with your period.   You pass out.  MAKE SURE YOU:   Understand these instructions.   Will watch your condition.   Will get help right away if you are not doing well or get worse.  Document Released: 08/04/2005 Document Revised: 07/24/2011 Document Reviewed: 11/20/2008 [employer] Patient Information 2012 [employer]. 

## 2011-11-04 NOTE — MAU Note (Signed)
Pt stated cramping pain started on Friday and small amount of vaginal bleeding started on Sat. And lasted one day.

## 2011-11-04 NOTE — MAU Note (Signed)
Pt stated last period prior to this vaginal bleeding was Jan. 17th.  Pt did not have a period in Feb.

## 2012-07-28 ENCOUNTER — Inpatient Hospital Stay (HOSPITAL_COMMUNITY)
Admission: AD | Admit: 2012-07-28 | Discharge: 2012-07-28 | Disposition: A | Discharge status: Home / Self Care | Payer: Self-pay | Source: Ambulatory Visit | Attending: Obstetrics & Gynecology | Admitting: Obstetrics & Gynecology

## 2012-07-28 ENCOUNTER — Encounter (HOSPITAL_COMMUNITY): Payer: Self-pay | Admitting: *Deleted

## 2012-07-28 DIAGNOSIS — Z3202 Encounter for pregnancy test, result negative: Secondary | ICD-10-CM

## 2012-07-28 DIAGNOSIS — R109 Unspecified abdominal pain: Secondary | ICD-10-CM

## 2012-07-28 LAB — URINE MICROSCOPIC-ADD ON

## 2012-07-28 LAB — URINALYSIS, ROUTINE W REFLEX MICROSCOPIC
Bilirubin Urine: NEGATIVE
Glucose, UA: >1000 mg/dL — AB
Hgb urine dipstick: NEGATIVE
Ketones, ur: 15 mg/dL — AB
pH: 5.5 (ref 5.0–8.0)

## 2012-07-28 NOTE — MAU Provider Note (Signed)
Attestation of Attending Supervision of Advanced Practitioner (PA/CNM/NP): Evaluation and management procedures were performed by the Advanced Practitioner under my supervision and collaboration.  I have reviewed the Advanced Practitioner's note and chart, and I agree with the management and plan.  Adalberto Metzgar, MD, FACOG Attending Obstetrician & Gynecologist Faculty Practice, [facility]  

## 2012-07-28 NOTE — MAU Note (Signed)
Patient states she has not had a period since October 9. Has had 2 negative pregnancy tests and one that was slightly positive. Had cramping in November when she should have a period and has started cramping again. Denies any bleeding or discharge. Has some vomiting on and off.

## 2012-07-28 NOTE — MAU Provider Note (Signed)
History     CSN: [id number]  Arrival date and time: 07/28/12 1117   First Provider Initiated Contact with Patient 07/28/12 1239      Chief Complaint  Patient presents with  . Possible Pregnancy  . Abdominal Pain   HPI Cheryl Mercer 26 y.o. Comes to MAU today as she has not had a period since Oct.9.  Did 2 home pregnancy tests and one was slightly positive, so she came here for evaluation.  Denies any new sex partners.  Denies having any bothersome vaginal discharge.  Does have upper and lower abdominal pain periodically which she usually has when her period starts.   OB History    Grav Para Term Preterm Abortions TAB SAB Ect Mult Living   5 4 4       4       Past Medical History  Diagnosis Date  . No pertinent past medical history     Past Surgical History  Procedure Date  . Cervical biopsy  w/ loop electrode excision 2009  . Laparoscopic tubal sterilization with falope rings. 2009    Family History  Problem Relation Age of Onset  . Anesthesia problems Neg Hx     History  Substance Use Topics  . Smoking status: Never Smoker   . Smokeless tobacco: Never Used  . Alcohol Use: 3.6 oz/week    6 Shots of liquor per week     Comment: last time was 4 months ago, occassionly    Allergies: No Known Allergies  Prescriptions prior to admission  Medication Sig Dispense Refill  . ibuprofen (ADVIL,MOTRIN) 100 MG tablet Take 100 mg by mouth every 6 (six) hours as needed. For cramps.        Review of Systems  Gastrointestinal: Positive for abdominal pain. Negative for nausea, vomiting, diarrhea and constipation.  Genitourinary: Negative for dysuria.   Physical Exam   Blood pressure 128/70, pulse 97, temperature 98.1 F (36.7 C), temperature source Oral, resp. rate 18, height 4' 10.5" (1.486 m), weight 86.002 kg (189 lb 9.6 oz), last menstrual period 05/27/2011, SpO2 100.00%.  Physical Exam  Nursing note and vitals reviewed. Constitutional: She is oriented to  person, place, and time. She appears well-developed and well-nourished. No distress.  HENT:  Head: Normocephalic.  Eyes: EOM are normal.  Neck: Neck supple.  GI: Soft. There is no tenderness.  Genitourinary:       Declines pelvic exam.  No vaginal symptoms.  No new sex partner.  Musculoskeletal: Normal range of motion.  Neurological: She is alert and oriented to person, place, and time.  Skin: Skin is warm and dry.  Psychiatric: She has a normal mood and affect.    MAU Course  Procedures  MDM Results for orders placed during the hospital encounter of 07/28/12 (from the past 24 hour(s))  URINALYSIS, ROUTINE W REFLEX MICROSCOPIC     Status: Abnormal   Collection Time   07/28/12 11:35 AM      Component Value Range   Color, Urine YELLOW  YELLOW   APPearance HAZY (*) CLEAR   Specific Gravity, Urine 1.020  1.005 - 1.030   pH 5.5  5.0 - 8.0   Glucose, UA >1000 (*) NEGATIVE mg/dL   Hgb urine dipstick NEGATIVE  NEGATIVE   Bilirubin Urine NEGATIVE  NEGATIVE   Ketones, ur 15 (*) NEGATIVE mg/dL   Protein, ur NEGATIVE  NEGATIVE mg/dL   Urobilinogen, UA 0.2  0.0 - 1.0 mg/dL   Nitrite POSITIVE (*) NEGATIVE  Leukocytes, UA NEGATIVE  NEGATIVE  URINE MICROSCOPIC-ADD ON     Status: Abnormal   Collection Time   07/28/12 11:35 AM      Component Value Range   Squamous Epithelial / LPF MANY (*) RARE   WBC, UA 3-6  <3 WBC/hpf   RBC / HPF 0-2  <3 RBC/hpf   Bacteria, UA FEW (*) RARE   Urine-Other MUCOUS PRESENT    POCT PREGNANCY, URINE     Status: Normal   Collection Time   07/28/12 11:38 AM      Component Value Range   Preg Test, Ur NEGATIVE  NEGATIVE   Assessment and Plan  Abdominal pain Negative pregnancy test  Plan Denies any urinary symptoms so will await culture result before treating urine results. Advised to see a doctor as she is spilling sugar in her urine which may be a sign of diabetes. Pregnancy test is negative today.   No abdominal pain on exam  today.   Cheryl Mercer 07/28/2012, 1:04 PM

## 2012-07-30 ENCOUNTER — Other Ambulatory Visit: Payer: Self-pay | Admitting: Obstetrics & Gynecology

## 2012-07-30 DIAGNOSIS — N390 Urinary tract infection, site not specified: Secondary | ICD-10-CM | POA: Insufficient documentation

## 2012-07-30 LAB — URINE CULTURE

## 2012-07-30 MED ORDER — CIPROFLOXACIN HCL 500 MG PO TABS: 500.0000 mg | ORAL_TABLET | Freq: Two times a day (BID) | ORAL | Status: DC

## 2012-07-30 NOTE — Progress Notes (Addendum)
Unable to reach pt by phone- call did not go through to home tel #.  The mobile phone # is not valid- removed from demographics. 08/02/12  1610- Still unable to reach pt by phone- will send certified letter.

## 2012-07-30 NOTE — Progress Notes (Signed)
Patient was seen in the MAU recently and urine culture was sent which returned with E.coli sensitive to several agents.  Ciprofloxacin 500 mg po bid x 3 days prescribed; had negative pregnancy test during encounter. Patient will be called to pick up prescription.

## 2012-08-02 ENCOUNTER — Encounter: Payer: Self-pay | Admitting: *Deleted

## 2013-01-12 ENCOUNTER — Inpatient Hospital Stay (HOSPITAL_COMMUNITY)
Admission: AD | Admit: 2013-01-12 | Discharge: 2013-01-12 | Disposition: A | Discharge status: Home / Self Care | Payer: Medicaid Other | Source: Ambulatory Visit | Attending: Obstetrics & Gynecology | Admitting: Obstetrics & Gynecology

## 2013-01-12 ENCOUNTER — Encounter (HOSPITAL_COMMUNITY): Payer: Self-pay

## 2013-01-12 DIAGNOSIS — M545 Low back pain, unspecified: Secondary | ICD-10-CM | POA: Insufficient documentation

## 2013-01-12 DIAGNOSIS — A499 Bacterial infection, unspecified: Secondary | ICD-10-CM

## 2013-01-12 DIAGNOSIS — I10 Essential (primary) hypertension: Secondary | ICD-10-CM | POA: Insufficient documentation

## 2013-01-12 DIAGNOSIS — R1013 Epigastric pain: Secondary | ICD-10-CM | POA: Insufficient documentation

## 2013-01-12 DIAGNOSIS — E119 Type 2 diabetes mellitus without complications: Secondary | ICD-10-CM | POA: Insufficient documentation

## 2013-01-12 DIAGNOSIS — B9689 Other specified bacterial agents as the cause of diseases classified elsewhere: Secondary | ICD-10-CM | POA: Insufficient documentation

## 2013-01-12 DIAGNOSIS — N760 Acute vaginitis: Secondary | ICD-10-CM | POA: Insufficient documentation

## 2013-01-12 HISTORY — DX: Gestational (pregnancy-induced) hypertension without significant proteinuria, unspecified trimester: O13.9

## 2013-01-12 LAB — CBC
MCH: 30.2 pg (ref 26.0–34.0)
MCV: 84.3 fL (ref 78.0–100.0)
Platelets: 306 10*3/uL (ref 150–400)
RBC: 4.14 MIL/uL (ref 3.87–5.11)
RDW: 12 % (ref 11.5–15.5)
WBC: 9.3 10*3/uL (ref 4.0–10.5)

## 2013-01-12 LAB — URINALYSIS, ROUTINE W REFLEX MICROSCOPIC
Glucose, UA: 100 mg/dL — AB
Leukocytes, UA: NEGATIVE
Nitrite: NEGATIVE
pH: 6 (ref 5.0–8.0)

## 2013-01-12 LAB — COMPREHENSIVE METABOLIC PANEL
ALT: 20 U/L (ref 0–35)
AST: 34 U/L (ref 0–37)
Albumin: 3.5 g/dL (ref 3.5–5.2)
CO2: 28 mEq/L (ref 19–32)
Calcium: 9.2 mg/dL (ref 8.4–10.5)
Chloride: 99 mEq/L (ref 96–112)
Creatinine, Ser: 0.63 mg/dL (ref 0.50–1.10)
GFR calc non Af Amer: >90 mL/min (ref >90)
Sodium: 136 mEq/L (ref 135–145)

## 2013-01-12 LAB — POCT PREGNANCY, URINE: Preg Test, Ur: NEGATIVE

## 2013-01-12 LAB — WET PREP, GENITAL

## 2013-01-12 MED ORDER — GI COCKTAIL ~~LOC~~
30.0000 mL | Freq: Once | ORAL | Status: AC
  Administered 2013-01-12: 30 mL via ORAL
  Filled 2013-01-12: qty 30

## 2013-01-12 MED ORDER — METRONIDAZOLE 500 MG PO TABS: 500.0000 mg | ORAL_TABLET | Freq: Two times a day (BID) | ORAL | Status: DC

## 2013-01-12 MED ORDER — TRAMADOL HCL 50 MG PO TABS: 50.0000 mg | ORAL_TABLET | Freq: Four times a day (QID) | ORAL | Status: DC | PRN

## 2013-01-12 MED ORDER — KETOROLAC TROMETHAMINE 60 MG/2ML IM SOLN
60.0000 mg | Freq: Once | INTRAMUSCULAR | Status: AC
  Administered 2013-01-12: 60 mg via INTRAMUSCULAR
  Filled 2013-01-12: qty 2

## 2013-01-12 MED ORDER — HYDROCHLOROTHIAZIDE 12.5 MG PO TABS: 25.0000 mg | ORAL_TABLET | Freq: Every day | ORAL | Status: DC

## 2013-01-12 NOTE — MAU Note (Signed)
Pt reports generalized abd pain x 1 week, and lower back pain x 1 week. Denies dysuria. LMP 11/15/2012, ? Positive preg test 2 weeks ago.

## 2013-01-12 NOTE — MAU Provider Note (Signed)
Chief Complaint: Abdominal Pain, Back Pain and Possible Pregnancy  First Provider Initiated Contact with Patient 01/12/13 2058     SUBJECTIVE HPI: Cheryl Mercer is a 27 y.o. [id number] female, LMP 11/15/2012 who presents with intermittent low abdominal cramping, constant epigastric pain and constant mid low back pain x1 week. Possible faint positive pregnancy test 2 weeks ago. Rates pain 7/10 on pain scale. Minimal improvement with ibuprofen. No relationship to eating. All pain improved slightly when reclining. Tubal sterilization with Falope-Rings in 2009.  Past Medical History  Diagnosis Date  . No pertinent past medical history   . Pregnancy induced hypertension    OB History   Grav Para Term Preterm Abortions TAB SAB Ect Mult Living   4 4 4       4      # Outc Date GA Lbr Len/2nd Wgt Sex Del Anes PTL Lv   1 TRM      SVD   Yes   2 TRM      SVD   Yes   3 TRM      SVD   Yes   4 TRM      SVD   Yes     Past Surgical History  Procedure Laterality Date  . Cervical biopsy  w/ loop electrode excision  2009  . Laparoscopic tubal sterilization with falope rings.  2009  . Tubal ligation     History   Social History  . Marital Status: Single    Spouse Name: N/A    Number of Children: N/A  . Years of Education: N/A   Occupational History  . Not on file.   Social History Main Topics  . Smoking status: Never Smoker   . Smokeless tobacco: Never Used  . Alcohol Use: 3.6 oz/week    6 Shots of liquor per week     Comment: last time was 4 months ago, occassionly  . Drug Use: No     Comment: quit 2013  . Sexually Active: Yes    Birth Control/ Protection: Surgical     Comment: band placed around tubes in 2009 surgically by Dr. Emelda Fear   Other Topics Concern  . Not on file   Social History Narrative  . No narrative on file   No current facility-administered medications on file prior to encounter.   No current outpatient prescriptions on file prior to encounter.   No Known  Allergies  ROS: Positive for polydipsia. Negative for fever, chills, nausea, vomiting, diarrhea, constipation, urinary complaints, vaginal discharge, chest pain or shortness of breath. Occasionally has irregular periods.  OBJECTIVE Blood pressure 155/90, pulse 89, temperature 99.2 F (37.3 C), temperature source Oral, resp. rate 16, height 5' (1.524 m), weight 83.462 kg (184 lb), last menstrual period 11/15/2012, SpO2 100.00%. Patient Vitals for the past 24 hrs:  BP Temp Temp src Pulse Resp SpO2 Height Weight  01/12/13 2216 155/90 mmHg - - 89 16 - - -  01/12/13 2030 153/88 mmHg - - 91 - - - -  01/12/13 2007 153/91 mmHg 99.2 F (37.3 C) Oral 91 18 100 % 5' (1.524 m) 83.462 kg (184 lb)   GENERAL: Well-developed, well-nourished obese female in no acute distress.  HEENT: Normocephalic HEART: normal rate and rhythm.  RESP: normal effort ABDOMEN: Soft, obese. Mild suprapubic tenderness. Positive bowel sounds x4. Mild bilateral low abdominal tenderness. No masses, guarding or rebound tenderness. EXTREMITIES: Nontender, no edema NEURO: Alert and oriented SPECULUM EXAM: NEFG, moderate amount of creamy, white, malodorous discharge,  no blood noted, cervix clean BIMANUAL: cervix closed; uterus size difficult to assess due to body habitus, positive bladder tenderness. No CMT or adnexal tenderness or masses  LAB RESULTS Results for orders placed during the hospital encounter of 01/12/13 (from the past 24 hour(s))  URINALYSIS, ROUTINE W REFLEX MICROSCOPIC     Status: Abnormal   Collection Time    01/12/13  8:10 PM      Result Value Range   Color, Urine YELLOW  YELLOW   APPearance CLEAR  CLEAR   Specific Gravity, Urine >1.030 (*) 1.005 - 1.030   pH 6.0  5.0 - 8.0   Glucose, UA 100 (*) NEGATIVE mg/dL   Hgb urine dipstick NEGATIVE  NEGATIVE   Bilirubin Urine NEGATIVE  NEGATIVE   Ketones, ur NEGATIVE  NEGATIVE mg/dL   Protein, ur NEGATIVE  NEGATIVE mg/dL   Urobilinogen, UA 0.2  0.0 - 1.0 mg/dL    Nitrite NEGATIVE  NEGATIVE   Leukocytes, UA NEGATIVE  NEGATIVE  POCT PREGNANCY, URINE     Status: None   Collection Time    01/12/13  8:16 PM      Result Value Range   Preg Test, Ur NEGATIVE  NEGATIVE  HCG, SERUM, QUALITATIVE     Status: None   Collection Time    01/12/13  8:35 PM      Result Value Range   Preg, Serum NEGATIVE  NEGATIVE  CBC     Status: Abnormal   Collection Time    01/12/13  8:35 PM      Result Value Range   WBC 9.3  4.0 - 10.5 K/uL   RBC 4.14  3.87 - 5.11 MIL/uL   Hemoglobin 12.5  12.0 - 15.0 g/dL   HCT 16.1 (*) 09.6 - 04.5 %   MCV 84.3  78.0 - 100.0 fL   MCH 30.2  26.0 - 34.0 pg   MCHC 35.8  30.0 - 36.0 g/dL   RDW 40.9  81.1 - 91.4 %   Platelets 306  150 - 400 K/uL  COMPREHENSIVE METABOLIC PANEL     Status: Abnormal   Collection Time    01/12/13  8:35 PM      Result Value Range   Sodium 136  135 - 145 mEq/L   Potassium 3.7  3.5 - 5.1 mEq/L   Chloride 99  96 - 112 mEq/L   CO2 28  19 - 32 mEq/L   Glucose, Bld 248 (*) 70 - 99 mg/dL   BUN 4 (*) 6 - 23 mg/dL   Creatinine, Ser 7.82  0.50 - 1.10 mg/dL   Calcium 9.2  8.4 - 95.6 mg/dL   Total Protein 6.5  6.0 - 8.3 g/dL   Albumin 3.5  3.5 - 5.2 g/dL   AST 34  0 - 37 U/L   ALT 20  0 - 35 U/L   Alkaline Phosphatase 50  39 - 117 U/L   Total Bilirubin 0.2 (*) 0.3 - 1.2 mg/dL   GFR calc non Af Amer >90  >90 mL/min   GFR calc Af Amer >90  >90 mL/min  WET PREP, GENITAL     Status: Abnormal   Collection Time    01/12/13  9:42 PM      Result Value Range   Yeast Wet Prep HPF POC NONE SEEN  NONE SEEN   Trich, Wet Prep NONE SEEN  NONE SEEN   Clue Cells Wet Prep HPF POC MODERATE (*) NONE SEEN   WBC, Wet Prep  HPF POC FEW (*) NONE SEEN    IMAGING No results found.  MAU COURSE Pain improve significantly with GI cocktail and Toradol.  ASSESSMENT 1. BV (bacterial vaginosis)   2. Acute epigastric pain -possible GERD   3. Diabetes mellitus, type 2   4. Hypertension    PLAN Discharge home in stable condition  per consult with Dr. Despina Hidden. Lengthy discussion with patient about likely diagnoses of type 2 diabetes and hypertension. Discussed confirmatory testing that will need to be done with primary care provider. Strongly emphasized importance of managing blood sugars and blood pressure. Hemoglobin A1c and GC/Chlamydia cultures pending. Start HCTZ per consultation with Dr. Despina Hidden. Discussed diet for GERD and taking over-the-counter Pepcid.     Follow-up Information   Follow up with Primary care provider. (for further evaluation of diabetes and high blood pressure)        Medication List    STOP taking these medications       ciprofloxacin 500 MG tablet  Commonly known as:  CIPRO     ibuprofen 100 MG tablet  Commonly known as:  ADVIL,MOTRIN      TAKE these medications       acetaminophen 500 MG tablet  Commonly known as:  TYLENOL  Take 1,000 mg by mouth every 6 (six) hours as needed for pain.     hydrochlorothiazide 12.5 MG tablet  Commonly known as:  HYDRODIURIL  Take 2 tablets (25 mg total) by mouth daily.     metroNIDAZOLE 500 MG tablet  Commonly known as:  FLAGYL  Take 1 tablet (500 mg total) by mouth 2 (two) times daily.     traMADol 50 MG tablet  Commonly known as:  ULTRAM  Take 1-2 tablets (50-100 mg total) by mouth every 6 (six) hours as needed for pain.       [place], [place] 01/12/2013  10:53 PM

## 2013-01-13 LAB — GC/CHLAMYDIA PROBE AMP
CT Probe RNA: NEGATIVE
GC Probe RNA: NEGATIVE

## 2013-01-13 LAB — HEMOGLOBIN A1C
Hgb A1c MFr Bld: 8.1 % — ABNORMAL HIGH (ref <5.7)
Mean Plasma Glucose: 186 mg/dL — ABNORMAL HIGH (ref <117)

## 2013-01-14 LAB — URINE CULTURE: Special Requests: NORMAL

## 2013-01-16 ENCOUNTER — Other Ambulatory Visit: Payer: Self-pay | Admitting: Advanced Practice Midwife

## 2013-01-16 DIAGNOSIS — N390 Urinary tract infection, site not specified: Secondary | ICD-10-CM

## 2013-01-16 MED ORDER — SULFAMETHOXAZOLE-TRIMETHOPRIM 800-160 MG PO TABS: 1.0000 | ORAL_TABLET | Freq: Two times a day (BID) | ORAL | Status: DC

## 2013-01-16 NOTE — Progress Notes (Signed)
Urine culture + ecoli, rx sent for Bactrim 1 po bid x 3 days.

## 2013-05-18 ENCOUNTER — Encounter (HOSPITAL_COMMUNITY): Payer: Self-pay | Admitting: Emergency Medicine

## 2013-05-18 ENCOUNTER — Emergency Department (HOSPITAL_COMMUNITY)
Admission: EM | Admit: 2013-05-18 | Discharge: 2013-05-18 | Disposition: A | Discharge status: Home / Self Care | Payer: Medicaid Other | Attending: Emergency Medicine | Admitting: Emergency Medicine

## 2013-05-18 ENCOUNTER — Emergency Department (HOSPITAL_COMMUNITY)
Admission: EM | Admit: 2013-05-18 | Discharge: 2013-05-18 | Disposition: A | Discharge status: Home / Self Care | Payer: Self-pay | Attending: Emergency Medicine | Admitting: Emergency Medicine

## 2013-05-18 DIAGNOSIS — J029 Acute pharyngitis, unspecified: Secondary | ICD-10-CM

## 2013-05-18 DIAGNOSIS — R509 Fever, unspecified: Secondary | ICD-10-CM | POA: Insufficient documentation

## 2013-05-18 DIAGNOSIS — R599 Enlarged lymph nodes, unspecified: Secondary | ICD-10-CM | POA: Insufficient documentation

## 2013-05-18 DIAGNOSIS — B9689 Other specified bacterial agents as the cause of diseases classified elsewhere: Secondary | ICD-10-CM

## 2013-05-18 DIAGNOSIS — J36 Peritonsillar abscess: Secondary | ICD-10-CM

## 2013-05-18 LAB — CBC
Hemoglobin: 12 g/dL (ref 12.0–15.0)
Platelets: 311 10*3/uL (ref 150–400)
RBC: 3.91 MIL/uL (ref 3.87–5.11)

## 2013-05-18 LAB — RAPID STREP SCREEN (MED CTR MEBANE ONLY): Streptococcus, Group A Screen (Direct): NEGATIVE

## 2013-05-18 MED ORDER — ONDANSETRON HCL 4 MG/2ML IJ SOLN
4.0000 mg | Freq: Once | INTRAMUSCULAR | Status: AC
  Administered 2013-05-18: 4 mg via INTRAVENOUS
  Filled 2013-05-18: qty 2

## 2013-05-18 MED ORDER — DEXAMETHASONE SODIUM PHOSPHATE 10 MG/ML IJ SOLN
10.0000 mg | Freq: Once | INTRAMUSCULAR | Status: AC
  Administered 2013-05-18: 10 mg via INTRAVENOUS
  Filled 2013-05-18: qty 1

## 2013-05-18 MED ORDER — SODIUM CHLORIDE 0.9 % IV BOLUS (SEPSIS)
1000.0000 mL | Freq: Once | INTRAVENOUS | Status: AC
  Administered 2013-05-18: 1000 mL via INTRAVENOUS

## 2013-05-18 MED ORDER — DEXTROSE 5 % IV SOLN
1.0000 g | INTRAVENOUS | Status: DC
  Administered 2013-05-18: 1 g via INTRAVENOUS
  Filled 2013-05-18: qty 10

## 2013-05-18 MED ORDER — MORPHINE SULFATE 4 MG/ML IJ SOLN
4.0000 mg | Freq: Once | INTRAMUSCULAR | Status: AC
  Administered 2013-05-18: 4 mg via INTRAVENOUS
  Filled 2013-05-18: qty 1

## 2013-05-18 MED ORDER — AMOXICILLIN 250 MG/5ML PO SUSR
1000.0000 mg | Freq: Three times a day (TID) | ORAL | Status: DC
  Filled 2013-05-18: qty 20

## 2013-05-18 MED ORDER — HYDROCODONE-ACETAMINOPHEN 7.5-325 MG/15ML PO SOLN: 10.0000 mL | ORAL | Status: DC | PRN

## 2013-05-18 MED ORDER — AMOXICILLIN 250 MG/5ML PO SUSR: 1000.0000 mg | Freq: Three times a day (TID) | ORAL | Status: DC

## 2013-05-18 NOTE — ED Provider Notes (Addendum)
CSN: [id number]     Arrival date & time 05/18/13  2956 History   First MD Initiated Contact with Patient 05/18/13 [phone]     Chief Complaint  Patient presents with  . Sore Throat   (Consider location/radiation/quality/duration/timing/severity/associated sxs/prior Treatment) Patient is a 27 y.o. female presenting with pharyngitis. The history is provided by the patient.  Sore Throat Pertinent negatives include no headaches and no shortness of breath.  pt c/o fever, and left sided throat pain in past 1-2 days. Constant. Dull. Mod-severe. States pain makes it difficult to swallow. No trouble breathing. No known ill contacts. No sinus drainage, congestion, cough or other uri c/o. Subjective fevers.     Past Medical History  Diagnosis Date  . No pertinent past medical history   . Pregnancy induced hypertension    Past Surgical History  Procedure Laterality Date  . Cervical biopsy  w/ loop electrode excision  2009  . Laparoscopic tubal sterilization with falope rings.  2009  . Tubal ligation     Family History  Problem Relation Age of Onset  . Anesthesia problems Neg Hx    History  Substance Use Topics  . Smoking status: Never Smoker   . Smokeless tobacco: Never Used  . Alcohol Use: 3.6 oz/week    6 Shots of liquor per week     Comment: last time was 4 months ago, occassionly   OB History   Grav Para Term Preterm Abortions TAB SAB Ect Mult Living   4 4 4       4      Review of Systems  Constitutional: Positive for fever.  HENT: Positive for sore throat and trouble swallowing. Negative for voice change.   Respiratory: Negative for cough and shortness of breath.   Gastrointestinal: Negative for vomiting.  Skin: Negative for rash.  Neurological: Negative for headaches.    Allergies  Review of patient's allergies indicates no known allergies.  Home Medications   Current Outpatient Rx  Name  Route  Sig  Dispense  Refill  . acetaminophen (TYLENOL) 500 MG tablet   Oral  Take 1,000 mg by mouth every 6 (six) hours as needed for pain.          BP 132/102  Pulse 98  Temp(Src) 98.8 F (37.1 C) (Oral)  Resp 18  Ht 5' (1.524 m)  Wt 189 lb (85.73 kg)  BMI 36.91 kg/m2  SpO2 100%  LMP 05/03/2013 Physical Exam  Nursing note and vitals reviewed. Constitutional: She appears well-developed and well-nourished. No distress.  HENT:  Pharynx erythematous, no exudate, predominant swelling on left. Uvula midline.   Eyes: Conjunctivae are normal. No scleral icterus.  Neck: Neck supple. No tracheal deviation present.  Cardiovascular: Normal rate, regular rhythm, normal heart sounds and intact distal pulses.   Pulmonary/Chest: Effort normal and breath sounds normal. No respiratory distress.  Abdominal: Soft. Normal appearance. She exhibits no distension. There is no tenderness.  No hsm  Musculoskeletal: She exhibits no edema.  Lymphadenopathy:    She has cervical adenopathy.  Neurological: She is alert.  Skin: Skin is warm and dry. No rash noted.  Psychiatric: She has a normal mood and affect.    ED Course  Procedures (including critical care time)  Results for orders placed during the hospital encounter of 05/18/13  RAPID STREP SCREEN      Result Value Range   Streptococcus, Group A Screen (Direct) NEGATIVE  NEGATIVE  CBC      Result Value Range   WBC  13.0 (*) 4.0 - 10.5 K/uL   RBC 3.91  3.87 - 5.11 MIL/uL   Hemoglobin 12.0  12.0 - 15.0 g/dL   HCT 16.1 (*) 09.6 - 04.5 %   MCV 85.2  78.0 - 100.0 fL   MCH 30.7  26.0 - 34.0 pg   MCHC 36.0  30.0 - 36.0 g/dL   RDW 40.9  81.1 - 91.4 %   Platelets 311  150 - 400 K/uL      MDM  Lab, strep screen.  Pt c/o being unable to drink or swallow much at all in past 1-2 days. Iv ns bolus. Morphine iv (pt has ride, does not have to drive). zofran iv.  Given unilateral throat pain and swelling, ?pta, will consult ent.  Reviewed nursing notes and prior charts for additional history.   Discussed pt with Dr  Lazarus Salines including concern possible early pta, he indicates to send to office, they will eval there.   Pt breathing comfortably and appears stable for d/c, to go directly to ent office for their eval.       Suzi Roots, MD 05/18/13 [phone]

## 2013-05-18 NOTE — ED Notes (Signed)
Per Dr. Denton Lank - okay to discharge patient home.   ENT will see her in their office.

## 2013-05-18 NOTE — ED Notes (Signed)
Pt just d/c and dr Lazarus Salines is coming in to see her

## 2013-05-18 NOTE — Consult Note (Signed)
Cheryl Mercer, Cheryl Mercer 27 y.o., female [id number]     Chief Complaint:  Severe sore throat  HPI: 27 year old Hispanic female started having a bad sore throat yesterday morning. Subjective fever, undocumented. No breathing or voice difficulty. Swallowing is very painful. She has tenderness in her left neck. No trismus. She had strep throat several years ago. Her hospital records show a peritonsillar abscess, left, in September 2012 which was drained by Dr. Jearld Fenton in the emergency room. She has no recollection of this event. She is otherwise healthy. Her right throat feels okay.  PMH: Past Medical History  Diagnosis Date  . No pertinent past medical history   . Pregnancy induced hypertension     Surg Hx: Past Surgical History  Procedure Laterality Date  . Cervical biopsy  w/ loop electrode excision  2009  . Laparoscopic tubal sterilization with falope rings.  2009  . Tubal ligation      FHx:   Family History  Problem Relation Age of Onset  . Anesthesia problems Neg Hx    SocHx:  reports that she has never smoked. She has never used smokeless tobacco. She reports that she drinks about 3.6 ounces of alcohol per week. She reports that she does not use illicit drugs.  ALLERGIES: No Known Allergies   (Not in a hospital admission)  Results for orders placed during the hospital encounter of 05/18/13 (from the past 48 hour(s))  RAPID STREP SCREEN     Status: None   Collection Time    05/18/13  8:58 AM      Result Value Range   Streptococcus, Group A Screen (Direct) NEGATIVE  NEGATIVE   Comment: (NOTE)     A Rapid Antigen test may result negative if the antigen level in the     sample is below the detection level of this test. The FDA has not     cleared this test as a stand-alone test therefore the rapid antigen     negative result has reflexed to a Group A Strep culture.  CBC     Status: Abnormal   Collection Time    05/18/13  9:04 AM      Result Value Range   WBC 13.0 (*) 4.0 -  10.5 K/uL   RBC 3.91  3.87 - 5.11 MIL/uL   Hemoglobin 12.0  12.0 - 15.0 g/dL   HCT 13.0 (*) 86.5 - 78.4 %   MCV 85.2  78.0 - 100.0 fL   MCH 30.7  26.0 - 34.0 pg   MCHC 36.0  30.0 - 36.0 g/dL   RDW 69.6  29.5 - 28.4 %   Platelets 311  150 - 400 K/uL   No results found.   Blood pressure 138/97, pulse 84, temperature 98.8 F (37.1 C), temperature source Oral, resp. rate 16, last menstrual period 05/03/2013, SpO2 98.00%.  PHYSICAL EXAM: Overall appearance:  Tired and uncomfortable. She is speaking softly on account of discomfort. Voice and respirations are clear. Head:  Atraumatic Ears:   Clear bilateral Nose:  Moist and patent Oral Cavity:  Teeth in fair to good repair. She has slight asymmetric swelling of the left tonsil. She has exudates on both tonsils. No palatal asymmetry, erythema, or bulging. No uvular edema or deviation. Minimal odor. No "hot potato" voice. Neuro: Symmetric are grossly intact Neck:  Tender, left jugulodigastric region.   Assessment/Plan Acute tonsillitis. In the face of a history of a previous abscess, a new abscess may develop more rapidly. We will treat her now  with amoxicillin and liquid hydrocodone. If this is progressive, she may yet have an abscess requiring incision and drainage. We may need to discuss eventual tonsillectomy.  Recheck my office 2 weeks, sooner as needed.  Flo Shanks 05/18/2013, 11:50 AM

## 2013-05-18 NOTE — ED Notes (Signed)
Patient states started getting a sore throat yesterday.  Patient advised she had been running a fever, but didn't check it ("i just felt the heat").    Patient denies any other symptoms.

## 2013-05-20 LAB — CULTURE, GROUP A STREP

## 2013-05-21 ENCOUNTER — Telehealth (HOSPITAL_COMMUNITY): Payer: Self-pay | Admitting: Emergency Medicine

## 2013-05-21 NOTE — ED Notes (Signed)
Post ED Visit - Positive Culture Follow-up  Culture report reviewed by antimicrobial stewardship pharmacist: []  Wes Dulaney, Pharm.D., BCPS []  Celedonio Miyamoto, Pharm.D., BCPS []  Georgina Pillion, Pharm.D., BCPS []  [place], [place].D., BCPS, AAHIVP []  Estella Husk, Pharm.D., BCPS, AAHIVP [x]  Abran Duke, [address].D., BCPS  Positive strep culture Treated with Amoxicillin, organism sensitive to the same and no further patient follow-up is required at this time.  Kylie A Holland 05/21/2013, 4:00 PM

## 2013-05-23 ENCOUNTER — Emergency Department (HOSPITAL_COMMUNITY): Payer: Medicaid Other

## 2013-05-23 ENCOUNTER — Emergency Department (HOSPITAL_COMMUNITY)
Admission: EM | Admit: 2013-05-23 | Discharge: 2013-05-23 | Disposition: A | Discharge status: Home / Self Care | Payer: Medicaid Other | Attending: Emergency Medicine | Admitting: Emergency Medicine

## 2013-05-23 ENCOUNTER — Encounter (HOSPITAL_COMMUNITY): Payer: Self-pay | Admitting: Emergency Medicine

## 2013-05-23 DIAGNOSIS — R7309 Other abnormal glucose: Secondary | ICD-10-CM | POA: Insufficient documentation

## 2013-05-23 DIAGNOSIS — IMO0001 Reserved for inherently not codable concepts without codable children: Secondary | ICD-10-CM | POA: Insufficient documentation

## 2013-05-23 DIAGNOSIS — J36 Peritonsillar abscess: Secondary | ICD-10-CM | POA: Insufficient documentation

## 2013-05-23 DIAGNOSIS — R739 Hyperglycemia, unspecified: Secondary | ICD-10-CM

## 2013-05-23 LAB — POCT I-STAT, CHEM 8
Calcium, Ion: 1.2 mmol/L (ref 1.12–1.23)
Chloride: 98 mEq/L (ref 96–112)
Creatinine, Ser: 0.7 mg/dL (ref 0.50–1.10)
Glucose, Bld: 233 mg/dL — ABNORMAL HIGH (ref 70–99)
HCT: 41 % (ref 36.0–46.0)
Hemoglobin: 13.9 g/dL (ref 12.0–15.0)
Potassium: 3.2 mEq/L — ABNORMAL LOW (ref 3.5–5.1)
TCO2: 25 mmol/L (ref 0–100)

## 2013-05-23 LAB — CBC WITH DIFFERENTIAL/PLATELET
Basophils Absolute: 0 10*3/uL (ref 0.0–0.1)
Basophils Relative: 0 % (ref 0–1)
Eosinophils Absolute: 0.2 10*3/uL (ref 0.0–0.7)
HCT: 36.8 % (ref 36.0–46.0)
MCH: 30.8 pg (ref 26.0–34.0)
MCHC: 36.4 g/dL — ABNORMAL HIGH (ref 30.0–36.0)
Monocytes Absolute: 0.8 10*3/uL (ref 0.1–1.0)
Monocytes Relative: 5 % (ref 3–12)
Neutro Abs: 9.9 10*3/uL — ABNORMAL HIGH (ref 1.7–7.7)
Neutrophils Relative %: 64 % (ref 43–77)
RDW: 12 % (ref 11.5–15.5)

## 2013-05-23 LAB — RAPID STREP SCREEN (MED CTR MEBANE ONLY): Streptococcus, Group A Screen (Direct): POSITIVE — AB

## 2013-05-23 MED ORDER — HYDROCODONE-ACETAMINOPHEN 5-325 MG PO TABS: 1.0000 | ORAL_TABLET | Freq: Four times a day (QID) | ORAL | Status: DC | PRN

## 2013-05-23 MED ORDER — METFORMIN HCL 500 MG PO TABS: 500.0000 mg | ORAL_TABLET | Freq: Two times a day (BID) | ORAL | Status: DC

## 2013-05-23 MED ORDER — KETOROLAC TROMETHAMINE 30 MG/ML IJ SOLN
30.0000 mg | Freq: Once | INTRAMUSCULAR | Status: AC
  Administered 2013-05-23: 30 mg via INTRAVENOUS
  Filled 2013-05-23: qty 1

## 2013-05-23 MED ORDER — BENZONATATE 100 MG PO CAPS: 100.0000 mg | ORAL_CAPSULE | Freq: Three times a day (TID) | ORAL | Status: DC

## 2013-05-23 MED ORDER — SODIUM CHLORIDE 0.9 % IV BOLUS (SEPSIS)
1000.0000 mL | Freq: Once | INTRAVENOUS | Status: AC
  Administered 2013-05-23: 1000 mL via INTRAVENOUS

## 2013-05-23 MED ORDER — AMOXICILLIN-POT CLAVULANATE 875-125 MG PO TABS: 1.0000 | ORAL_TABLET | Freq: Two times a day (BID) | ORAL | Status: DC

## 2013-05-23 MED ORDER — FENTANYL CITRATE 0.05 MG/ML IJ SOLN
50.0000 ug | Freq: Once | INTRAMUSCULAR | Status: AC
  Administered 2013-05-23: 50 ug via INTRAVENOUS
  Filled 2013-05-23: qty 2

## 2013-05-23 MED ORDER — DEXAMETHASONE SODIUM PHOSPHATE 4 MG/ML IJ SOLN
4.0000 mg | Freq: Once | INTRAMUSCULAR | Status: AC
  Administered 2013-05-23: 4 mg via INTRAVENOUS
  Filled 2013-05-23: qty 1

## 2013-05-23 MED ORDER — CLINDAMYCIN PHOSPHATE 600 MG/50ML IV SOLN
600.0000 mg | Freq: Once | INTRAVENOUS | Status: AC
  Administered 2013-05-23: 600 mg via INTRAVENOUS
  Filled 2013-05-23: qty 50

## 2013-05-23 MED ORDER — HYDROMORPHONE HCL PF 1 MG/ML IJ SOLN
0.5000 mg | Freq: Once | INTRAMUSCULAR | Status: AC
  Administered 2013-05-23: 0.5 mg via INTRAVENOUS
  Filled 2013-05-23: qty 1

## 2013-05-23 MED ORDER — IOHEXOL 300 MG/ML  SOLN
80.0000 mL | Freq: Once | INTRAMUSCULAR | Status: AC | PRN
  Administered 2013-05-23: 80 mL via INTRAVENOUS

## 2013-05-23 MED ORDER — CLINDAMYCIN HCL 150 MG PO CAPS: 300.0000 mg | ORAL_CAPSULE | Freq: Three times a day (TID) | ORAL | Status: DC

## 2013-05-23 MED ORDER — PREDNISONE 10 MG PO TABS: ORAL_TABLET | ORAL | Status: DC

## 2013-05-23 NOTE — ED Notes (Signed)
Pt c/o pain in throat is getting worse. Pt given heat pack and tatiyana PA-C made aware.

## 2013-05-23 NOTE — ED Notes (Signed)
Pt c/o sore throat with body aches x 6 days; pt seen here on Wednesday for same and not feeling better

## 2013-05-23 NOTE — ED Notes (Signed)
Pt second bolus complete as ordered by tatiyana PA

## 2013-05-23 NOTE — ED Notes (Signed)
Pt seen here Weds of last week and consulted by ENT [facility]. Pt was was given IVF and IV abx. Was told to return today for a recheck. States she has not improved. Still unable to swallow fluids per pt. No respiratory distress.

## 2013-05-23 NOTE — ED Provider Notes (Signed)
CSN: [id number]     Arrival date & time 05/23/13  4098 History  This chart was scribed for non-physician practitioner Jaynie Crumble, PA-C working with Geoffery Lyons, MD by Valera Castle, ED scribe. This patient was seen in room TR07C/TR07C and the patient's care was started at 10:02 AM.    Chief Complaint  Patient presents with  . Sore Throat    The history is provided by the patient. No language interpreter was used.   HPI Comments: Cheryl Mercer is a 27 y.o. female who presents to the Emergency Department complaining of sudden, moderate, constant sore throat, with associated body aches, onset 6 days ago. Pt states she was seen here 6 days ago on Wednesday for the same symptoms, but has not been feeling better. She denies a h/o having had these symptoms prior to 6 days ago. She reports that it hurts more on the left side, but that it has been moving over to the right. She states that she is having difficulty swallowing, drinking, and eating. She reports taking amoxicillin, with little relief. She reports that about 2 years ago she had an infection and she had to have the left side of her throat medically drained. She denies any fever, and any other associated symptoms. She reports 6 shots of liquor a week, 4 months ago, but denies smoking. She has no known allergies. She denies any pertinent medical history.   Past Medical History  Diagnosis Date  . No pertinent past medical history   . Pregnancy induced hypertension    Past Surgical History  Procedure Laterality Date  . Cervical biopsy  w/ loop electrode excision  2009  . Laparoscopic tubal sterilization with falope rings.  2009  . Tubal ligation     Family History  Problem Relation Age of Onset  . Anesthesia problems Neg Hx    History  Substance Use Topics  . Smoking status: Never Smoker   . Smokeless tobacco: Never Used  . Alcohol Use: 3.6 oz/week    6 Shots of liquor per week     Comment: last time was 4 months ago,  occassionly   OB History   Grav Para Term Preterm Abortions TAB SAB Ect Mult Living   4 4 4       4      Review of Systems  Constitutional: Negative for fever.  HENT: Positive for sore throat.   Musculoskeletal:       Body aches.  All other systems reviewed and are negative.    Allergies  Review of patient's allergies indicates no known allergies.  Home Medications   Current Outpatient Rx  Name  Route  Sig  Dispense  Refill  . amoxicillin (AMOXIL) 250 MG/5ML suspension   Oral   Take 20 mLs (1,000 mg total) by mouth every 8 (eight) hours.   400 mL   0   . HYDROcodone-acetaminophen (HYCET) 7.5-325 mg/15 ml solution   Oral   Take 10-20 mLs by mouth every 4 (four) hours as needed for pain.   400 mL   0    Triage Vitals: BP 147/107  Pulse 98  Temp(Src) 99.1 F (37.3 C) (Oral)  Resp 18  Ht 5' (1.524 m)  Wt 169 lb 1.6 oz (76.703 kg)  BMI 33.02 kg/m2  SpO2 98%  LMP 05/03/2013  Physical Exam  Nursing note and vitals reviewed. Constitutional: She is oriented to person, place, and time. She appears well-developed and well-nourished. No distress.  HENT:  Head: Normocephalic and  atraumatic.  Mouth/Throat: Uvula is midline.  Bilateral enlarged tonsils. Left greater than the right.   Eyes: EOM are normal.  Neck: Neck supple. No tracheal deviation present.  Cardiovascular: Normal rate.   Pulmonary/Chest: Effort normal and breath sounds normal. No respiratory distress.  Musculoskeletal: Normal range of motion.  Neurological: She is alert and oriented to person, place, and time.  Skin: Skin is warm and dry.  Psychiatric: She has a normal mood and affect. Her behavior is normal.    ED Course  Procedures (including critical care time)  DIAGNOSTIC STUDIES: Oxygen Saturation is 98% on room air, normal by my interpretation.    COORDINATION OF CARE: 10:05 AM-Discussed treatment plan which includes a strep screen, Deltasone, Augmentin, and Tessalon with pt at bedside and  pt agreed to plan.     Labs Review Labs Reviewed  RAPID STREP SCREEN - Abnormal; Notable for the following:    Streptococcus, Group A Screen (Direct) POSITIVE (*)    All other components within normal limits  CBC WITH DIFFERENTIAL - Abnormal; Notable for the following:    WBC 15.4 (*)    MCHC 36.4 (*)    Neutro Abs 9.9 (*)    Lymphs Abs 4.4 (*)    All other components within normal limits  POCT I-STAT, CHEM 8 - Abnormal; Notable for the following:    Potassium 3.2 (*)    BUN <3 (*)    Glucose, Bld 233 (*)    All other components within normal limits   Imaging Review Ct Soft Tissue Neck W Contrast  05/23/2013   CLINICAL DATA:  Sore throat. Left-sided pain. Difficulty swallowing.  EXAM: CT NECK WITH CONTRAST  TECHNIQUE: Multidetector CT imaging of the neck was performed using the standard protocol following the bolus administration of intravenous contrast.  CONTRAST:  80mL OMNIPAQUE IOHEXOL 300 MG/ML  SOLN  COMPARISON:  CT neck 04/21/2011.  FINDINGS: Diffuse edematous changes are noted about the left palatine tonsil. A focal area of hypoattenuation with rim enhancement measures 10.0 x 14.6 x 13.7 mm along the and inferior aspect of the tonsil concerning for developing abscess. The airway is displaced to the right without significant airway compromise. Edematous changes extend into the left area epiglottic fold. Inflammatory changes are present within the retropharynx on the left.  Multiple enlarged level 2 and lymph nodes are present bilaterally, left greater than right. And mildly enlarged submandibular lymph nodes are present, worse on the left.  The thyroid is within normal limits. Find no focal mucosal or submucosal lesions are present otherwise. The vocal cords are midline and symmetric.  The lung apices are clear.  Bone windows demonstrate no focal lytic or blastic lesions. Vertebral body heights and alignment are maintained. There is slight reversal of the normal cervical lordosis.   IMPRESSION: 1. Diffuse inflammatory changes about the left palatine tonsil. 2. Focal area with peripheral enhancement measuring 14.6 mm maximally compatible with a developing left peritonsillar abscess. 3. Bilateral and cervical adenopathy 1 is likely reactive, left greater than right. 4. And straightening is some reversal of normal cervical lordosis. This is likely in response to the patient's neck pain.   Electronically Signed   By: Gennette Pac M.D.   On: 05/23/2013 13:01    No orders of the defined types were placed in this encounter.     MDM   1. Peritonsillar abscess   2. Hyperglycemia     Initial exam concerning for possible early left peritonsillar abscess. i called and spoke with  Dr. Clovis Pu nurse who recommended ct.   1:30 PM CT resulted. Discussed with Dr. Annalee Genta, advised clindamycin IV and at home. Patient was given 600 mg of clindamycin IV. Patient's pain was treated with Dilaudid and fentanyl with only mild reversal. She was given 2 L IV fluids. She was given 4 mg of Decadron for swelling. Patient is able to swallow liquids in emergency department and will be discharged home with clindamycin as per Dr. Clovis Pu requests. Patient will followup with [facility] for outpatient or return if her symptoms are worsening. It is also noted the patient's blood sugar is elevated, it is 233 today. I will start her on metformin. She'll need close follow primary care Dr.  Ceasar Mons Vitals:   05/23/13 1345 05/23/13 1400 05/23/13 1415 05/23/13 1425  BP: 136/86 129/79 126/81 126/81  Pulse: 81 86 85 77  Temp:    97.8 F (36.6 C)  TempSrc:    Oral  Resp:    16  Height:      Weight:      SpO2: 99% 99% 96% 99%     I personally performed the services described in this documentation, which was scribed in my presence. The recorded information has been reviewed and is accurate.   Lottie Mussel, PA-C 05/23/13 1610

## 2013-05-24 NOTE — ED Provider Notes (Signed)
Medical screening examination/treatment/procedure(s) were conducted as a shared visit with non-physician practitioner(s) and myself.  I personally evaluated the patient during the encounter. Patient is a 27 year old female presents to the emergency department with complaints of sore throat. She was seen 3 days ago for similar complaints. She was seen by ear nose and throat and felt as though treatment with amoxicillin and observation was indicated. The did not feel as though she had an abscess that required drainage at that time. She returns today with worsening sore throat and increased pain in the left side of her throat. She denies difficulty breathing but is having pain with swallowing and opening her mouth.  On exam, she is afebrile and vitals are stable. Oxygen saturations are 98%. She is awake, alert, and oriented. Heart is regular rate and rhythm and she is in no respiratory distress. There is no stridor. Exam of the throat reveals erythema and swelling of the left tonsil with deviation toward the midline. There is slight cervical adenopathy palpable and she is tender under the left mandible.  Patient was given IV fluids. In consultation with ENT, the decision was made to perform a CT scan. This revealed a 1.5 cm peritonsillar abscess with swelling and inflammation around the left palatine tonsil. She was given steroids and IV clindamycin. She will be discharged to home with followup with Dr. Annalee Genta in the clinic.  Geoffery Lyons, MD 05/24/13 [id number]

## 2013-12-19 ENCOUNTER — Encounter (HOSPITAL_COMMUNITY): Payer: Self-pay | Admitting: *Deleted

## 2013-12-19 ENCOUNTER — Inpatient Hospital Stay (HOSPITAL_COMMUNITY)
Admission: AD | Admit: 2013-12-19 | Discharge: 2013-12-19 | Disposition: A | Discharge status: Home / Self Care | Payer: Medicaid Other | Source: Ambulatory Visit | Attending: Obstetrics & Gynecology | Admitting: Obstetrics & Gynecology

## 2013-12-19 DIAGNOSIS — IMO0001 Reserved for inherently not codable concepts without codable children: Secondary | ICD-10-CM

## 2013-12-19 DIAGNOSIS — M545 Low back pain, unspecified: Secondary | ICD-10-CM | POA: Insufficient documentation

## 2013-12-19 DIAGNOSIS — N390 Urinary tract infection, site not specified: Secondary | ICD-10-CM | POA: Insufficient documentation

## 2013-12-19 DIAGNOSIS — IMO0002 Reserved for concepts with insufficient information to code with codable children: Secondary | ICD-10-CM

## 2013-12-19 DIAGNOSIS — A499 Bacterial infection, unspecified: Secondary | ICD-10-CM | POA: Insufficient documentation

## 2013-12-19 DIAGNOSIS — E119 Type 2 diabetes mellitus without complications: Secondary | ICD-10-CM

## 2013-12-19 DIAGNOSIS — B9689 Other specified bacterial agents as the cause of diseases classified elsewhere: Secondary | ICD-10-CM | POA: Insufficient documentation

## 2013-12-19 DIAGNOSIS — Z3202 Encounter for pregnancy test, result negative: Secondary | ICD-10-CM | POA: Insufficient documentation

## 2013-12-19 DIAGNOSIS — N760 Acute vaginitis: Secondary | ICD-10-CM

## 2013-12-19 DIAGNOSIS — R109 Unspecified abdominal pain: Secondary | ICD-10-CM | POA: Insufficient documentation

## 2013-12-19 DIAGNOSIS — R030 Elevated blood-pressure reading, without diagnosis of hypertension: Secondary | ICD-10-CM | POA: Insufficient documentation

## 2013-12-19 DIAGNOSIS — X58XXXA Exposure to other specified factors, initial encounter: Secondary | ICD-10-CM | POA: Insufficient documentation

## 2013-12-19 HISTORY — DX: Type 2 diabetes mellitus without complications: E11.9

## 2013-12-19 LAB — URINE MICROSCOPIC-ADD ON

## 2013-12-19 LAB — WET PREP, GENITAL
Trich, Wet Prep: NONE SEEN
Yeast Wet Prep HPF POC: NONE SEEN

## 2013-12-19 LAB — URINALYSIS, ROUTINE W REFLEX MICROSCOPIC
Bilirubin Urine: NEGATIVE
GLUCOSE, UA: NEGATIVE mg/dL
Hgb urine dipstick: NEGATIVE
Ketones, ur: 15 mg/dL — AB
LEUKOCYTES UA: NEGATIVE
Nitrite: POSITIVE — AB
PH: 6 (ref 5.0–8.0)
Protein, ur: NEGATIVE mg/dL
Specific Gravity, Urine: >1.03 — ABNORMAL HIGH (ref 1.005–1.030)
Urobilinogen, UA: 0.2 mg/dL (ref 0.0–1.0)

## 2013-12-19 LAB — POCT PREGNANCY, URINE: PREG TEST UR: NEGATIVE

## 2013-12-19 LAB — HCG, QUANTITATIVE, PREGNANCY: hCG, Beta Chain, Quant, S: <1 m[IU]/mL (ref <5)

## 2013-12-19 MED ORDER — CYCLOBENZAPRINE HCL 5 MG PO TABS: 5.0000 mg | ORAL_TABLET | Freq: Three times a day (TID) | ORAL | Status: DC | PRN

## 2013-12-19 MED ORDER — METRONIDAZOLE 500 MG PO TABS: 500.0000 mg | ORAL_TABLET | Freq: Two times a day (BID) | ORAL | Status: DC

## 2013-12-19 MED ORDER — KETOROLAC TROMETHAMINE 30 MG/ML IJ SOLN
30.0000 mg | Freq: Once | INTRAMUSCULAR | Status: AC
  Administered 2013-12-19: 30 mg via INTRAMUSCULAR
  Filled 2013-12-19: qty 1

## 2013-12-19 NOTE — MAU Provider Note (Signed)
CC: Possible Pregnancy, Abdominal Pain, Back Pain, Nausea and Vaginal Discharge    First Provider Initiated Contact with Patient 12/19/13 2049      HPI Cheryl Mercer is a 28 y.o. [id number]  who presents stating her HPTs were positive and LMP 10/25/13.  Menses were regular prior to missing April. Today she noted light pink vaginal spotting on toilet tissue. She has had lower abdominal crampy pain and mid to low back pain for a few days. Denies heavy lifting or strenuous activity. Pain is sharp and exacerbated by moving. Today back pain is worse. Denies dysuria, urgency, frequency.  BTL 6 years ago. Paps up to date.   Past Medical History  Diagnosis Date  . No pertinent past medical history   . Pregnancy induced hypertension   . Diabetes mellitus without complication   No hx kidney stones  OB History  Gravida Para Term Preterm AB SAB TAB Ectopic Multiple Living  4 4 4       4     # Outcome Date GA Lbr Len/2nd Weight Sex Delivery Anes PTL Lv  4 TRM      SVD   Y  3 TRM      SVD   Y  2 TRM      SVD   Y  1 TRM      SVD   Y      Past Surgical History  Procedure Laterality Date  . Cervical biopsy  w/ loop electrode excision  2009  . Laparoscopic tubal sterilization with falope rings.  2009  . Tubal ligation      History   Social History  . Marital Status: Married    Spouse Name: N/A    Number of Children: N/A  . Years of Education: N/A   Occupational History  . Not on file.   Social History Main Topics  . Smoking status: Never Smoker   . Smokeless tobacco: Never Used  . Alcohol Use: 3.6 oz/week    6 Shots of liquor per week     Comment: last time was 4 months ago, occassionly  . Drug Use: No     Comment: quit 2013  . Sexual Activity: Yes    Birth Control/ Protection: Surgical     Comment: band placed around tubes in 2009 surgically by Dr. Emelda FearFerguson   Other Topics Concern  . Not on file   Social History Narrative  . No narrative on file    No current  facility-administered medications on file prior to encounter.   Current Outpatient Prescriptions on File Prior to Encounter  Medication Sig Dispense Refill  . metFORMIN (GLUCOPHAGE) 500 MG tablet Take 1 tablet (500 mg total) by mouth 2 (two) times daily with a meal.  60 tablet  1    No Known Allergies  ROS Pertinent items in HPI  PHYSICAL EXAM Filed Vitals:   12/19/13 1909  BP: 135/102  Pulse: 88  Temp: 98.4 F (36.9 C)  Resp: 20   General: Well nourished, well developed female in no acute distress Cardiovascular: Normal rate Respiratory: Normal effort Abdomen: Soft, mildly tender suprapubic region, no guarding or rebound Back: No CVAT; TTP along L-S spine bilaterally Extremities: No edema Neurologic: Alert and oriented Speculum exam: NEFG; vagina with physiologic white discharge, no blood; cervix clean Bimanual exam: cervix closed, no CMT; uterus mobile,sl tender; no adnexal tenderness or masses   LAB RESULTS Results for orders placed during the hospital encounter of 12/19/13 (from the past 24 hour(s))  URINALYSIS, ROUTINE W REFLEX MICROSCOPIC     Status: Abnormal   Collection Time    12/19/13  7:00 PM      Result Value Ref Range   Color, Urine YELLOW  YELLOW   APPearance CLEAR  CLEAR   Specific Gravity, Urine >1.030 (*) 1.005 - 1.030   pH 6.0  5.0 - 8.0   Glucose, UA NEGATIVE  NEGATIVE mg/dL   Hgb urine dipstick NEGATIVE  NEGATIVE   Bilirubin Urine NEGATIVE  NEGATIVE   Ketones, ur 15 (*) NEGATIVE mg/dL   Protein, ur NEGATIVE  NEGATIVE mg/dL   Urobilinogen, UA 0.2  0.0 - 1.0 mg/dL   Nitrite POSITIVE (*) NEGATIVE   Leukocytes, UA NEGATIVE  NEGATIVE  URINE MICROSCOPIC-ADD ON     Status: Abnormal   Collection Time    12/19/13  7:00 PM      Result Value Ref Range   Squamous Epithelial / LPF MANY (*) RARE   WBC, UA 0-2  <3 WBC/hpf   RBC / HPF 0-2  <3 RBC/hpf   Bacteria, UA MANY (*) RARE  POCT PREGNANCY, URINE     Status: None   Collection Time    12/19/13  7:09  PM      Result Value Ref Range   Preg Test, Ur NEGATIVE  NEGATIVE  HCG, QUANTITATIVE, PREGNANCY     Status: None   Collection Time    12/19/13  7:29 PM      Result Value Ref Range   hCG, Beta Chain, Quant, S <1  <5 mIU/mL  WET PREP, GENITAL     Status: Abnormal   Collection Time    12/19/13  8:55 PM      Result Value Ref Range   Yeast Wet Prep HPF POC NONE SEEN  NONE SEEN   Trich, Wet Prep NONE SEEN  NONE SEEN   Clue Cells Wet Prep HPF POC MODERATE (*) NONE SEEN   WBC, Wet Prep HPF POC FEW (*) NONE SEEN    IMAGING No results found.  MAU COURSE Toradol 30 mg IM with alleviation of back pain Urine C&S sent  ASSESSMENT  1. Back sprain or strain   2. Type 2 diabetes mellitus   3. Blood pressure elevated   4. UTI (lower urinary tract infection)   5. BV (bacterial vaginosis)     PLAN Discharge home with reassurance not pregnant See AVS for patient education.    Medication List         cyclobenzaprine 5 MG tablet  Commonly known as:  FLEXERIL  Take 1 tablet (5 mg total) by mouth 3 (three) times daily as needed for muscle spasms.     metFORMIN 500 MG tablet  Commonly known as:  GLUCOPHAGE  Take 1 tablet (500 mg total) by mouth 2 (two) times daily with a meal.     metroNIDAZOLE 500 MG tablet  Commonly known as:  FLAGYL  Take 1 tablet (500 mg total) by mouth 2 (two) times daily.       Follow-up Information   Follow up with [facility] [place]. Schedule an appointment as soon as possible for a visit in 1 week.   Specialty:  Family Medicine   Contact information:   [address] [place] [address] [phone]       Danae Orleans, CNM 12/19/2013 9:00 PM

## 2013-12-19 NOTE — Discharge Instructions (Signed)
Back Pain, Adult Low back pain is very common. About 1 in 5 people have back pain.The cause of low back pain is rarely dangerous. The pain often gets better over time.About half of people with a sudden onset of back pain feel better in just 2 weeks. About 8 in 10 people feel better by 6 weeks.  CAUSES Some common causes of back pain include:  Strain of the muscles or ligaments supporting the spine.  Wear and tear (degeneration) of the spinal discs.  Arthritis.  Direct injury to the back. DIAGNOSIS Most of the time, the direct cause of low back pain is not known.However, back pain can be treated effectively even when the exact cause of the pain is unknown.Answering your caregiver's questions about your overall health and symptoms is one of the most accurate ways to make sure the cause of your pain is not dangerous. If your caregiver needs more information, he or she may order lab work or imaging tests (X-rays or MRIs).However, even if imaging tests show changes in your back, this usually does not require surgery. HOME CARE INSTRUCTIONS For many people, back pain returns.Since low back pain is rarely dangerous, it is often a condition that people can learn to manageon their own.   Remain active. It is stressful on the back to sit or stand in one place. Do not sit, drive, or stand in one place for more than 30 minutes at a time. Take short walks on level surfaces as soon as pain allows.Try to increase the length of time you walk each day.  Do not stay in bed.Resting more than 1 or 2 days can delay your recovery.  Do not avoid exercise or work.Your body is made to move.It is not dangerous to be active, even though your back may hurt.Your back will likely heal faster if you return to being active before your pain is gone.  Pay attention to your body when you bend and lift. Many people have less discomfortwhen lifting if they bend their knees, keep the load close to their bodies,and  avoid twisting. Often, the most comfortable positions are those that put less stress on your recovering back.  Find a comfortable position to sleep. Use a firm mattress and lie on your side with your knees slightly bent. If you lie on your back, put a pillow under your knees.  Only take over-the-counter or prescription medicines as directed by your caregiver. Over-the-counter medicines to reduce pain and inflammation are often the most helpful.Your caregiver may prescribe muscle relaxant drugs.These medicines help dull your pain so you can more quickly return to your normal activities and healthy exercise.  Put ice on the injured area.  Put ice in a plastic bag.  Place a towel between your skin and the bag.  Leave the ice on for 15-20 minutes, 03-04 times a day for the first 2 to 3 days. After that, ice and heat may be alternated to reduce pain and spasms.  Ask your caregiver about trying back exercises and gentle massage. This may be of some benefit.  Avoid feeling anxious or stressed.Stress increases muscle tension and can worsen back pain.It is important to recognize when you are anxious or stressed and learn ways to manage it.Exercise is a great option. SEEK MEDICAL CARE IF:  You have pain that is not relieved with rest or medicine.  You have pain that does not improve in 1 week.  You have new symptoms.  You are generally not feeling well. SEEK   IMMEDIATE MEDICAL CARE IF:   You have pain that radiates from your back into your legs.  You develop new bowel or bladder control problems.  You have unusual weakness or numbness in your arms or legs.  You develop nausea or vomiting.  You develop abdominal pain.  You feel faint. Document Released: 08/04/2005 Document Revised: 02/03/2012 Document Reviewed: 12/23/2010 Exit[employer] Patient Information 2014 [employer]. Back Exercises Back exercises help treat and prevent back injuries. The goal of back exercises is to increase  the strength of your abdominal and back muscles and the flexibility of your back. These exercises should be started when you no longer have back pain. Back exercises include:  Pelvic Tilt. Lie on your back with your knees bent. Tilt your pelvis until the lower part of your back is against the floor. Hold this position 5 to 10 sec and repeat 5 to 10 times.  Knee to Chest. Pull first 1 knee up against your chest and hold for 20 to 30 seconds, repeat this with the other knee, and then both knees. This may be done with the other leg straight or bent, whichever feels better.  Sit-Ups or Curl-Ups. Bend your knees 90 degrees. Start with tilting your pelvis, and do a partial, slow sit-up, lifting your trunk only 30 to 45 degrees off the floor. Take at least 2 to 3 seconds for each sit-up. Do not do sit-ups with your knees out straight. If partial sit-ups are difficult, simply do the above but with only tightening your abdominal muscles and holding it as directed.  Hip-Lift. Lie on your back with your knees flexed 90 degrees. Push down with your feet and shoulders as you raise your hips a couple inches off the floor; hold for 10 seconds, repeat 5 to 10 times.  Back arches. Lie on your stomach, propping yourself up on bent elbows. Slowly press on your hands, causing an arch in your low back. Repeat 3 to 5 times. Any initial stiffness and discomfort should lessen with repetition over time.  Shoulder-Lifts. Lie face down with arms beside your body. Keep hips and torso pressed to floor as you slowly lift your head and shoulders off the floor. Do not overdo your exercises, especially in the beginning. Exercises may cause you some mild back discomfort which lasts for a few minutes; however, if the pain is more severe, or lasts for more than 15 minutes, do not continue exercises until you see your caregiver. Improvement with exercise therapy for back problems is slow.  See your caregivers for assistance with  developing a proper back exercise program. Document Released: 09/11/2004 Document Revised: 10/27/2011 Document Reviewed: 06/05/2011 [employer] Patient Information 2014 [employer].  

## 2013-12-19 NOTE — MAU Note (Signed)
Patient states she has had a positive home pregnancy test. States she had "bands around her tubes" that she had for 6 years. States she started having abdominal pain and back pain for 2-3 days. Nausea, no vomiting and had a little spotting x 1, none now.

## 2013-12-20 LAB — GC/CHLAMYDIA PROBE AMP
CT PROBE, AMP APTIMA: NEGATIVE
GC Probe RNA: NEGATIVE

## 2013-12-21 ENCOUNTER — Telehealth: Payer: Self-pay | Admitting: *Deleted

## 2013-12-21 LAB — URINE CULTURE: Special Requests: NORMAL

## 2013-12-21 NOTE — Telephone Encounter (Signed)
Attempted to contact patient, pt's phone states unable to accept calls at this time, will call emergency contact to see if we can reach her.  Message left on emergency contact's phone to have Cheryl Mercer call the clinic ASAP.

## 2013-12-21 NOTE — Telephone Encounter (Signed)
Message copied by Dorothyann Peng on Wed Dec 21, 2013 11:36 AM ------      Message from: POE, DEIRDRE C      Created: Wed Dec 21, 2013 10:55 AM       Keflex 500 qid x 7d       Please call in ------

## 2013-12-22 ENCOUNTER — Encounter: Payer: Self-pay | Admitting: *Deleted

## 2013-12-22 ENCOUNTER — Other Ambulatory Visit: Payer: Medicaid Other

## 2013-12-22 MED ORDER — CEPHALEXIN 500 MG PO CAPS: 500.0000 mg | ORAL_CAPSULE | Freq: Four times a day (QID) | ORAL | Status: DC

## 2013-12-22 NOTE — Telephone Encounter (Signed)
Patient was scheduled for stat beta quant today but did not show. Unable to reach patient by phone. Will send letter to patient and also inform of uti in letter.

## 2013-12-27 ENCOUNTER — Telehealth: Payer: Self-pay | Admitting: General Practice

## 2013-12-27 ENCOUNTER — Encounter: Payer: Self-pay | Admitting: General Practice

## 2013-12-27 NOTE — Telephone Encounter (Signed)
Called patient and message stating this person cannot receive calls at this time. Will send letter

## 2013-12-27 NOTE — Telephone Encounter (Signed)
Message copied by Kathee Delton on Tue Dec 27, 2013  4:35 PM ------      Message from: Danae Orleans      Created: Tue Dec 27, 2013  4:29 PM       Rx Keflex 500 qid x 7 ------

## 2014-01-13 ENCOUNTER — Encounter: Payer: Self-pay | Admitting: General Practice

## 2014-02-13 ENCOUNTER — Encounter: Payer: Self-pay | Admitting: General Practice

## 2014-06-19 ENCOUNTER — Encounter (HOSPITAL_COMMUNITY): Payer: Self-pay | Admitting: *Deleted

## 2014-11-03 ENCOUNTER — Emergency Department (HOSPITAL_COMMUNITY)
Admission: EM | Admit: 2014-11-03 | Discharge: 2014-11-03 | Disposition: A | Discharge status: Home / Self Care | Payer: Medicaid Other

## 2014-11-03 NOTE — ED Notes (Signed)
Pt called for triage x3 

## 2015-02-21 ENCOUNTER — Inpatient Hospital Stay (HOSPITAL_COMMUNITY)
Admission: AD | Admit: 2015-02-21 | Discharge: 2015-02-21 | Disposition: A | Discharge status: Home / Self Care | Payer: Medicaid Other | Source: Ambulatory Visit | Attending: Obstetrics & Gynecology | Admitting: Obstetrics & Gynecology

## 2015-02-21 ENCOUNTER — Inpatient Hospital Stay (HOSPITAL_COMMUNITY): Payer: Medicaid Other

## 2015-02-21 ENCOUNTER — Encounter (HOSPITAL_COMMUNITY): Payer: Self-pay | Admitting: *Deleted

## 2015-02-21 DIAGNOSIS — R109 Unspecified abdominal pain: Secondary | ICD-10-CM | POA: Diagnosis present

## 2015-02-21 DIAGNOSIS — A084 Viral intestinal infection, unspecified: Secondary | ICD-10-CM | POA: Insufficient documentation

## 2015-02-21 DIAGNOSIS — N390 Urinary tract infection, site not specified: Secondary | ICD-10-CM | POA: Insufficient documentation

## 2015-02-21 LAB — URINALYSIS, ROUTINE W REFLEX MICROSCOPIC
Glucose, UA: 250 mg/dL — AB
Ketones, ur: 15 mg/dL — AB
LEUKOCYTES UA: NEGATIVE
NITRITE: POSITIVE — AB
PH: 5.5 (ref 5.0–8.0)
Protein, ur: >300 mg/dL — AB
Urobilinogen, UA: 0.2 mg/dL (ref 0.0–1.0)

## 2015-02-21 LAB — CBC
HCT: 34.8 % — ABNORMAL LOW (ref 36.0–46.0)
Hemoglobin: 12.5 g/dL (ref 12.0–15.0)
MCH: 29.8 pg (ref 26.0–34.0)
MCHC: 35.9 g/dL (ref 30.0–36.0)
MCV: 83.1 fL (ref 78.0–100.0)
PLATELETS: 360 10*3/uL (ref 150–400)
RBC: 4.19 MIL/uL (ref 3.87–5.11)
RDW: 12.6 % (ref 11.5–15.5)
WBC: 12.8 10*3/uL — ABNORMAL HIGH (ref 4.0–10.5)

## 2015-02-21 LAB — POCT PREGNANCY, URINE: Preg Test, Ur: NEGATIVE

## 2015-02-21 LAB — URINE MICROSCOPIC-ADD ON

## 2015-02-21 LAB — HCG, QUANTITATIVE, PREGNANCY: hCG, Beta Chain, Quant, S: <1 m[IU]/mL (ref <5)

## 2015-02-21 LAB — WET PREP, GENITAL
Trich, Wet Prep: NONE SEEN
Yeast Wet Prep HPF POC: NONE SEEN

## 2015-02-21 LAB — ABO/RH: ABO/RH(D): A POS

## 2015-02-21 MED ORDER — KETOROLAC TROMETHAMINE 60 MG/2ML IM SOLN
60.0000 mg | Freq: Once | INTRAMUSCULAR | Status: AC
  Administered 2015-02-21: 60 mg via INTRAMUSCULAR
  Filled 2015-02-21: qty 2

## 2015-02-21 MED ORDER — ONDANSETRON 8 MG PO TBDP
8.0000 mg | ORAL_TABLET | Freq: Once | ORAL | Status: AC
  Administered 2015-02-21: 8 mg via ORAL
  Filled 2015-02-21: qty 1

## 2015-02-21 MED ORDER — NITROFURANTOIN MONOHYD MACRO 100 MG PO CAPS: 100.0000 mg | ORAL_CAPSULE | Freq: Two times a day (BID) | ORAL | Status: DC

## 2015-02-21 MED ORDER — PHENAZOPYRIDINE HCL 200 MG PO TABS: 200.0000 mg | ORAL_TABLET | Freq: Three times a day (TID) | ORAL | Status: DC

## 2015-02-21 MED ORDER — IBUPROFEN 600 MG PO TABS: 600.0000 mg | ORAL_TABLET | Freq: Four times a day (QID) | ORAL | Status: DC | PRN

## 2015-02-21 MED ORDER — ONDANSETRON HCL 4 MG PO TABS: 4.0000 mg | ORAL_TABLET | Freq: Four times a day (QID) | ORAL | Status: DC

## 2015-02-21 NOTE — MAU Note (Addendum)
+  HPT, equivocal test 5 wks ago.  Has been waiting on ins.  Started bleeding this morning.  Last night was having really bad back pains.cramping in abd- continues today.

## 2015-02-21 NOTE — MAU Provider Note (Signed)
History     CSN: [id number]  Arrival date and time: 02/21/15 1443   First Provider Initiated Contact with Patient 02/21/15 1524      Chief Complaint  Patient presents with  . Possible Pregnancy  . Vaginal Bleeding  . Abdominal Pain   HPI Pt is ?pregnant with hx of +HPT in June.  Pt had [facility] test with was equivical test and then had +[facility]. Pt has hx of BTL Pt's periods May 26-28 was light period.  Pt has had N/V for a couple of weeks and has had diarrhea for 2 days with  Last episode at 9 am his morning.  Pt has been having bile emesis this morning.  Pt took Pepto Bismal today - ( 1 bottle) ]without relief.  Pt started bleeding this morning and cramping; pt has filled 2 pads today with 2 small clots. Pt denies vaginal discharge, UTI sx Pt is on Metformin from [facility] for 1 year. RN note: Registered Nurse Addendum  MAU Note 02/21/2015 2:56 PM    Expand All Collapse All   +HPT, equivocal test 5 wks ago. Has been waiting on ins. Started bleeding this morning. Last night was having really bad back pains.cramping in abd- continues today.        Past Medical History  Diagnosis Date  . No pertinent past medical history   . Pregnancy induced hypertension   . Diabetes mellitus without complication     Past Surgical History  Procedure Laterality Date  . Cervical biopsy  w/ loop electrode excision  2009  . Laparoscopic tubal sterilization with falope rings.  2009  . Tubal ligation      Family History  Problem Relation Age of Onset  . Anesthesia problems Neg Hx     History  Substance Use Topics  . Smoking status: Never Smoker   . Smokeless tobacco: Never Used  . Alcohol Use: 3.6 oz/week    6 Shots of liquor per week     Comment: last time was 4 months ago, occassionly    Allergies: No Known Allergies  Prescriptions prior to admission  Medication Sig Dispense Refill Last Dose  . cephALEXin (KEFLEX) 500 MG capsule Take 1 capsule (500 mg  total) by mouth 4 (four) times daily. 28 capsule 0   . cyclobenzaprine (FLEXERIL) 5 MG tablet Take 1 tablet (5 mg total) by mouth 3 (three) times daily as needed for muscle spasms. 30 tablet 0   . metFORMIN (GLUCOPHAGE) 500 MG tablet Take 1 tablet (500 mg total) by mouth 2 (two) times daily with a meal. 60 tablet 1 12/19/2013 at Unknown time  . metroNIDAZOLE (FLAGYL) 500 MG tablet Take 1 tablet (500 mg total) by mouth 2 (two) times daily. 10 tablet 0     Review of Systems  Constitutional: Negative for fever and chills.  Gastrointestinal: Positive for nausea, vomiting, abdominal pain and diarrhea.  Genitourinary: Negative for dysuria, urgency and frequency.  Musculoskeletal: Positive for back pain.   Physical Exam   Blood pressure 136/83, pulse 97, temperature 97.9 F (36.6 C), temperature source Oral, resp. rate 18, height 4\' 11"  (1.499 m), weight 163 lb (73.936 kg), last menstrual period 01/12/2015.  Physical Exam  Nursing note and vitals reviewed. Constitutional: She is oriented to person, place, and time. She appears well-developed and well-nourished. No distress.  HENT:  Head: Normocephalic.  Eyes: Pupils are equal, round, and reactive to light.  Neck: Normal range of motion. Neck supple.  Cardiovascular: Normal rate.  Respiratory: Effort normal.  GI: Soft. She exhibits no distension. There is tenderness. There is no rebound.  Musculoskeletal: Normal range of motion.  Neurological: She is alert and oriented to person, place, and time.  Skin: Skin is warm and dry.  Psychiatric: She has a normal mood and affect.    MAU Course  Procedures Results for orders placed or performed during the hospital encounter of 02/21/15 (from the past 24 hour(s))  Pregnancy, urine POC     Status: None   Collection Time: 02/21/15  3:06 PM  Result Value Ref Range   Preg Test, Ur NEGATIVE NEGATIVE  CBC     Status: Abnormal   Collection Time: 02/21/15  3:30 PM  Result Value Ref Range   WBC 12.8  (H) 4.0 - 10.5 K/uL   RBC 4.19 3.87 - 5.11 MIL/uL   Hemoglobin 12.5 12.0 - 15.0 g/dL   HCT 32.4 (L) 40.1 - 02.7 %   MCV 83.1 78.0 - 100.0 fL   MCH 29.8 26.0 - 34.0 pg   MCHC 35.9 30.0 - 36.0 g/dL   RDW 25.3 66.4 - 40.3 %   Platelets 360 150 - 400 K/uL  ABO/Rh     Status: None   Collection Time: 02/21/15  3:30 PM  Result Value Ref Range   ABO/RH(D) A POS    Results for orders placed or performed during the hospital encounter of 02/21/15 (from the past 24 hour(s))  Pregnancy, urine POC     Status: None   Collection Time: 02/21/15  3:06 PM  Result Value Ref Range   Preg Test, Ur NEGATIVE NEGATIVE  CBC     Status: Abnormal   Collection Time: 02/21/15  3:30 PM  Result Value Ref Range   WBC 12.8 (H) 4.0 - 10.5 K/uL   RBC 4.19 3.87 - 5.11 MIL/uL   Hemoglobin 12.5 12.0 - 15.0 g/dL   HCT 47.4 (L) 25.9 - 56.3 %   MCV 83.1 78.0 - 100.0 fL   MCH 29.8 26.0 - 34.0 pg   MCHC 35.9 30.0 - 36.0 g/dL   RDW 87.5 64.3 - 32.9 %   Platelets 360 150 - 400 K/uL  hCG, quantitative, pregnancy     Status: None   Collection Time: 02/21/15  3:30 PM  Result Value Ref Range   hCG, Beta Chain, Quant, S <1 <5 mIU/mL  ABO/Rh     Status: None   Collection Time: 02/21/15  3:30 PM  Result Value Ref Range   ABO/RH(D) A POS   Wet prep, genital     Status: Abnormal   Collection Time: 02/21/15  4:40 PM  Result Value Ref Range   Yeast Wet Prep HPF POC NONE SEEN NONE SEEN   Trich, Wet Prep NONE SEEN NONE SEEN   Clue Cells Wet Prep HPF POC FEW (A) NONE SEEN   WBC, Wet Prep HPF POC MANY (A) NONE SEEN  US Transvaginal Non-ob  02/21/2015   CLINICAL DATA:  Pelvic pain and vaginal bleeding for 2 weeks. Initial encounter.  EXAM: TRANSABDOMINAL AND TRANSVAGINAL ULTRASOUND OF PELVIS  TECHNIQUE: Both transabdominal and transvaginal ultrasound examinations of the pelvis were performed. Transabdominal technique was performed for global imaging of the pelvis including uterus, ovaries, adnexal regions, and pelvic cul-de-sac. It  was necessary to proceed with endovaginal exam following the transabdominal exam to visualize the adnexa.  COMPARISON:  None  FINDINGS: Uterus  Measurements: 9.6 x 4.5 x 6.4 cm. No fibroids or other mass visualized.  Endometrium  Thickness:  11 mm.  No focal abnormality visualized.  Right ovary  Measurements: 3.4 x 2.6 x 2.1 cm. Normal appearance/no adnexal mass.  Left ovary  Measurements: 3.2 x 2.4 x 1.9 cm. Normal appearance/no adnexal mass.  Other findings  No free fluid.  IMPRESSION: Normal examination.   Electronically Signed   By: Drusilla Kanner M.D.   On: 02/21/2015 18:24   US Pelvis Complete  02/21/2015   CLINICAL DATA:  Pelvic pain and vaginal bleeding for 2 weeks. Initial encounter.  EXAM: TRANSABDOMINAL AND TRANSVAGINAL ULTRASOUND OF PELVIS  TECHNIQUE: Both transabdominal and transvaginal ultrasound examinations of the pelvis were performed. Transabdominal technique was performed for global imaging of the pelvis including uterus, ovaries, adnexal regions, and pelvic cul-de-sac. It was necessary to proceed with endovaginal exam following the transabdominal exam to visualize the adnexa.  COMPARISON:  None  FINDINGS: Uterus  Measurements: 9.6 x 4.5 x 6.4 cm. No fibroids or other mass visualized.  Endometrium  Thickness: 11 mm.  No focal abnormality visualized.  Right ovary  Measurements: 3.4 x 2.6 x 2.1 cm. Normal appearance/no adnexal mass.  Left ovary  Measurements: 3.2 x 2.4 x 1.9 cm. Normal appearance/no adnexal mass.  Other findings  No free fluid.  IMPRESSION: Normal examination.   Electronically Signed   By: Drusilla Kanner M.D.   On: 02/21/2015 18:24   Results for orders placed or performed during the hospital encounter of 02/21/15 (from the past 24 hour(s))  Pregnancy, urine POC     Status: None   Collection Time: 02/21/15  3:06 PM  Result Value Ref Range   Preg Test, Ur NEGATIVE NEGATIVE  CBC     Status: Abnormal   Collection Time: 02/21/15  3:30 PM  Result Value Ref Range   WBC 12.8  (H) 4.0 - 10.5 K/uL   RBC 4.19 3.87 - 5.11 MIL/uL   Hemoglobin 12.5 12.0 - 15.0 g/dL   HCT 16.1 (L) 09.6 - 04.5 %   MCV 83.1 78.0 - 100.0 fL   MCH 29.8 26.0 - 34.0 pg   MCHC 35.9 30.0 - 36.0 g/dL   RDW 40.9 81.1 - 91.4 %   Platelets 360 150 - 400 K/uL  hCG, quantitative, pregnancy     Status: None   Collection Time: 02/21/15  3:30 PM  Result Value Ref Range   hCG, Beta Chain, Quant, S <1 <5 mIU/mL  ABO/Rh     Status: None   Collection Time: 02/21/15  3:30 PM  Result Value Ref Range   ABO/RH(D) A POS   Wet prep, genital     Status: Abnormal   Collection Time: 02/21/15  4:40 PM  Result Value Ref Range   Yeast Wet Prep HPF POC NONE SEEN NONE SEEN   Trich, Wet Prep NONE SEEN NONE SEEN   Clue Cells Wet Prep HPF POC FEW (A) NONE SEEN   WBC, Wet Prep HPF POC MANY (A) NONE SEEN  Urinalysis, Routine w reflex microscopic (not at [facility])     Status: Abnormal   Collection Time: 02/21/15  7:30 PM  Result Value Ref Range   Color, Urine YELLOW YELLOW   APPearance CLEAR CLEAR   Specific Gravity, Urine >1.030 (H) 1.005 - 1.030   pH 5.5 5.0 - 8.0   Glucose, UA 250 (A) NEGATIVE mg/dL   Hgb urine dipstick LARGE (A) NEGATIVE   Bilirubin Urine SMALL (A) NEGATIVE   Ketones, ur 15 (A) NEGATIVE mg/dL   Protein, ur >782 (A) NEGATIVE mg/dL   Urobilinogen, UA  0.2 0.0 - 1.0 mg/dL   Nitrite POSITIVE (A) NEGATIVE   Leukocytes, UA NEGATIVE NEGATIVE  Urine microscopic-add on     Status: Abnormal   Collection Time: 02/21/15  7:30 PM  Result Value Ref Range   Squamous Epithelial / LPF FEW (A) RARE   WBC, UA 3-6 <3 WBC/hpf   RBC / HPF 11-20 <3 RBC/hpf   Bacteria, UA MANY (A) RARE   Urine-Other MUCOUS PRESENT   GC/chlamydia pending Urine culture pending Toradol  IM for pain zofran  ODT for nausea Assessment and Plan  Abdominal pain  Viral gastroenteritis-BRAT diet; Zofran  tablets UTI (pt left before results of urinalysis available- called pt and LM on nonpersonalized VM that Rx sent to  pharmacy and to  please call back and confirm she got message (MAcrobid BID and pyridium RX) Will have pt f/u at [facility] for further GYN issues Isis Costanza 02/21/2015, 3:25 PM

## 2015-02-21 NOTE — MAU Note (Signed)
Only enough urine for upt. Needs to collect more

## 2015-02-22 LAB — GC/CHLAMYDIA PROBE AMP (~~LOC~~) NOT AT ARMC
CHLAMYDIA, DNA PROBE: NEGATIVE
Neisseria Gonorrhea: NEGATIVE

## 2015-02-22 LAB — HIV ANTIBODY (ROUTINE TESTING W REFLEX): HIV Screen 4th Generation wRfx: NONREACTIVE

## 2015-02-23 LAB — URINE CULTURE: Special Requests: NORMAL

## 2015-03-20 ENCOUNTER — Emergency Department
Admission: EM | Admit: 2015-03-20 | Discharge: 2015-03-20 | Disposition: A | Discharge status: Home / Self Care | Payer: Medicaid Other | Attending: Emergency Medicine | Admitting: Emergency Medicine

## 2015-03-20 ENCOUNTER — Encounter: Payer: Self-pay | Admitting: Emergency Medicine

## 2015-03-20 DIAGNOSIS — E119 Type 2 diabetes mellitus without complications: Secondary | ICD-10-CM | POA: Insufficient documentation

## 2015-03-20 DIAGNOSIS — R112 Nausea with vomiting, unspecified: Secondary | ICD-10-CM | POA: Diagnosis not present

## 2015-03-20 DIAGNOSIS — Z79899 Other long term (current) drug therapy: Secondary | ICD-10-CM | POA: Diagnosis not present

## 2015-03-20 DIAGNOSIS — R519 Headache, unspecified: Secondary | ICD-10-CM

## 2015-03-20 DIAGNOSIS — R51 Headache: Secondary | ICD-10-CM | POA: Diagnosis not present

## 2015-03-20 MED ORDER — DIPHENHYDRAMINE HCL 50 MG/ML IJ SOLN
25.0000 mg | Freq: Once | INTRAMUSCULAR | Status: AC
  Administered 2015-03-20: 25 mg via INTRAVENOUS
  Filled 2015-03-20: qty 1

## 2015-03-20 MED ORDER — SODIUM CHLORIDE 0.9 % IV SOLN
1000.0000 mL | Freq: Once | INTRAVENOUS | Status: AC
  Administered 2015-03-20: 1000 mL via INTRAVENOUS
  Filled 2015-03-20: qty 1000

## 2015-03-20 MED ORDER — METOCLOPRAMIDE HCL 5 MG/ML IJ SOLN
20.0000 mg | Freq: Once | INTRAVENOUS | Status: AC
  Administered 2015-03-20: 20 mg via INTRAVENOUS
  Filled 2015-03-20 (×2): qty 4

## 2015-03-20 NOTE — ED Notes (Signed)
Patient presents to the ED with headache and nausea.  Patient reports history of hypertension.  Patient ambulatory to triage.  Respirations even and nonlabored.  No obvious distress at this time.

## 2015-03-20 NOTE — Discharge Instructions (Signed)

## 2015-03-20 NOTE — ED Notes (Signed)
Developed headache couple of days ago. Developed some n/v yesterday. Last time vomited was about 1-2 hours ago

## 2015-03-20 NOTE — ED Provider Notes (Signed)
[facility] Emergency Department Provider Note  ____________________________________________  Time seen: Noon  I have reviewed the triage vital signs and the nursing notes.   HISTORY  Chief Complaint Headache    HPI Cheryl Mercer is a 29 y.o. female who presents with complaints of a headache. She has had some nausea and vomiting. She reports she developed a headache 2 days ago which was gradual in onset and she notes is consistent with headaches she's had in the past. She notes she has been unable to shake it. She denies fevers chills. No neck pain. She does have some sensitivity to light. No neuro deficits     Past Medical History  Diagnosis Date  . No pertinent past medical history   . Pregnancy induced hypertension   . Diabetes mellitus without complication     Patient Active Problem List   Diagnosis Date Noted  . Acute bacterial tonsillitis 05/18/2013    Class: Acute  . UTI (lower urinary tract infection) 07/30/2012    Past Surgical History  Procedure Laterality Date  . Cervical biopsy  w/ loop electrode excision  2009  . Laparoscopic tubal sterilization with falope rings.  2009  . Tubal ligation      Current Outpatient Rx  Name  Route  Sig  Dispense  Refill  . ibuprofen (ADVIL,MOTRIN) 600 MG tablet   Oral   Take 1 tablet (600 mg total) by mouth every 6 (six) hours as needed.   30 tablet   0   . metFORMIN (GLUCOPHAGE) 500 MG tablet   Oral   Take 1 tablet (500 mg total) by mouth 2 (two) times daily with a meal.   60 tablet   1   . nitrofurantoin, macrocrystal-monohydrate, (MACROBID) 100 MG capsule   Oral   Take 1 capsule (100 mg total) by mouth 2 (two) times daily. Drink lots of water until urine light yellow   10 capsule   0   . ondansetron (ZOFRAN) 4 MG tablet   Oral   Take 1 tablet (4 mg total) by mouth every 6 (six) hours.   12 tablet   0   . phenazopyridine (PYRIDIUM) 200 MG tablet   Oral   Take 1 tablet  (200 mg total) by mouth 3 (three) times daily.   6 tablet   0     Allergies Review of patient's allergies indicates no known allergies.  Family History  Problem Relation Age of Onset  . Anesthesia problems Neg Hx     Social History History  Substance Use Topics  . Smoking status: Never Smoker   . Smokeless tobacco: Never Used  . Alcohol Use: 3.6 oz/week    6 Shots of liquor per week     Comment: last time was 4 months ago, occassionly    Review of Systems  Constitutional: Negative for fever. Eyes: Negative for visual changes. ENT: Negative for sore throat Cardiovascular: Negative for chest pain. Respiratory: Negative for shortness of breath. Gastrointestinal: Negative for abdominal pain, vomiting and diarrhea. Genitourinary: Negative for dysuria. Musculoskeletal: Negative for back pain. Skin: Negative for rash. Neurological: Negative for focal weakness Psychiatric: No anxiety    ____________________________________________   PHYSICAL EXAM:  VITAL SIGNS: ED Triage Vitals  Enc Vitals Group     BP 03/20/15 1047 132/82 mmHg     Pulse Rate 03/20/15 1047 83     Resp 03/20/15 1047 17     Temp 03/20/15 1047 97.9 F (36.6 C)     Temp  Source 03/20/15 1047 Oral     SpO2 03/20/15 1047 99 %     Weight 03/20/15 1047 163 lb (73.936 kg)     Height 03/20/15 1047  (1.448 m)     Head Cir --      Peak Flow --      Pain Score 03/20/15 1057 7     Pain Loc --      Pain Edu? --      Excl. in GC? --      Constitutional: Alert and oriented. Well appearing and in no distress. Eyes: Conjunctivae are normal. Normal fundus ENT   Head: Normocephalic and atraumatic.   Mouth/Throat: Mucous membranes are moist. Cardiovascular: Normal rate, regular rhythm. Normal and symmetric distal pulses are present in all extremities. No murmurs, rubs, or gallops. Respiratory:  Breath sounds are clear and equal bilaterally.  Gastrointestinal: Soft and non-tender in all quadrants.  No distention. There is no CVA tenderness. Genitourinary: deferred Musculoskeletal: Nontender with normal range of motion in all extremities. No lower extremity tenderness nor edema. Neurologic:  Normal speech and language. No gross focal neurologic deficits are appreciated. Skin:  Skin is warm, dry and intact. No rash noted. Psychiatric: Mood and affect are normal. Patient exhibits appropriate insight and judgment.  ____________________________________________    LABS (pertinent positives/negatives)  Labs Reviewed - No data to display  ____________________________________________   EKG  None ____________________________________________    RADIOLOGY I have personally reviewed any xrays that were ordered on this patient: None  ____________________________________________   PROCEDURES  Procedure(s) performed: none  Critical Care performed: none  ____________________________________________   INITIAL IMPRESSION / ASSESSMENT AND PLAN / ED COURSE  Pertinent labs & imaging results that were available during my care of the patient were reviewed by me and considered in my medical decision making (see chart for details).  Patient overall well-appearing and nontoxic. All vitals normal. We will treat her headache with Reglan and Benadryl.   Patient asked to leave prior to discharge because she had to go pick up her son. She reports feeling significantly better. She knows to return if she has any concerns  ____________________________________________   FINAL CLINICAL IMPRESSION(S) / ED DIAGNOSES  Final diagnoses:  Acute nonintractable headache, unspecified headache type     Jene Every, MD 03/20/15 [phone]

## 2015-05-18 ENCOUNTER — Emergency Department (HOSPITAL_COMMUNITY)
Admission: EM | Admit: 2015-05-18 | Discharge: 2015-05-18 | Disposition: A | Discharge status: Home / Self Care | Payer: Medicaid Other | Attending: Emergency Medicine | Admitting: Emergency Medicine

## 2015-05-18 ENCOUNTER — Encounter (HOSPITAL_COMMUNITY): Payer: Self-pay | Admitting: Emergency Medicine

## 2015-05-18 DIAGNOSIS — R1030 Lower abdominal pain, unspecified: Secondary | ICD-10-CM | POA: Diagnosis present

## 2015-05-18 DIAGNOSIS — Z3202 Encounter for pregnancy test, result negative: Secondary | ICD-10-CM | POA: Diagnosis not present

## 2015-05-18 DIAGNOSIS — R319 Hematuria, unspecified: Secondary | ICD-10-CM

## 2015-05-18 DIAGNOSIS — Z79899 Other long term (current) drug therapy: Secondary | ICD-10-CM | POA: Insufficient documentation

## 2015-05-18 DIAGNOSIS — N390 Urinary tract infection, site not specified: Secondary | ICD-10-CM | POA: Diagnosis not present

## 2015-05-18 DIAGNOSIS — E119 Type 2 diabetes mellitus without complications: Secondary | ICD-10-CM | POA: Diagnosis not present

## 2015-05-18 LAB — CBC WITH DIFFERENTIAL/PLATELET
BASOS PCT: 1 %
Basophils Absolute: 0.1 10*3/uL (ref 0.0–0.1)
EOS PCT: 2 %
Eosinophils Absolute: 0.2 10*3/uL (ref 0.0–0.7)
HCT: 34.5 % — ABNORMAL LOW (ref 36.0–46.0)
Hemoglobin: 12 g/dL (ref 12.0–15.0)
Lymphocytes Relative: 40 %
Lymphs Abs: 4.1 10*3/uL — ABNORMAL HIGH (ref 0.7–4.0)
MCH: 29.5 pg (ref 26.0–34.0)
MCHC: 34.8 g/dL (ref 30.0–36.0)
MCV: 84.8 fL (ref 78.0–100.0)
MONO ABS: 0.6 10*3/uL (ref 0.1–1.0)
MONOS PCT: 5 %
NEUTROS ABS: 5.4 10*3/uL (ref 1.7–7.7)
Neutrophils Relative %: 52 %
PLATELETS: 327 10*3/uL (ref 150–400)
RBC: 4.07 MIL/uL (ref 3.87–5.11)
RDW: 12.7 % (ref 11.5–15.5)
WBC: 10.2 10*3/uL (ref 4.0–10.5)

## 2015-05-18 LAB — URINE MICROSCOPIC-ADD ON

## 2015-05-18 LAB — URINALYSIS, ROUTINE W REFLEX MICROSCOPIC
Bilirubin Urine: NEGATIVE
Glucose, UA: 100 mg/dL — AB
KETONES UR: NEGATIVE mg/dL
Nitrite: NEGATIVE
PH: 5.5 (ref 5.0–8.0)
PROTEIN: NEGATIVE mg/dL
Specific Gravity, Urine: 1.017 (ref 1.005–1.030)
Urobilinogen, UA: 0.2 mg/dL (ref 0.0–1.0)

## 2015-05-18 LAB — POC URINE PREG, ED: Preg Test, Ur: NEGATIVE

## 2015-05-18 LAB — BASIC METABOLIC PANEL
ANION GAP: 9 (ref 5–15)
CALCIUM: 9.2 mg/dL (ref 8.9–10.3)
CO2: 28 mmol/L (ref 22–32)
Chloride: 103 mmol/L (ref 101–111)
Creatinine, Ser: 0.51 mg/dL (ref 0.44–1.00)
GFR calc Af Amer: >60 mL/min (ref >60)
GFR calc non Af Amer: >60 mL/min (ref >60)
GLUCOSE: 213 mg/dL — AB (ref 65–99)
Potassium: 3 mmol/L — ABNORMAL LOW (ref 3.5–5.1)
Sodium: 140 mmol/L (ref 135–145)

## 2015-05-18 MED ORDER — POTASSIUM CHLORIDE ER 10 MEQ PO TBCR: 10.0000 meq | EXTENDED_RELEASE_TABLET | Freq: Every day | ORAL | Status: DC

## 2015-05-18 MED ORDER — ONDANSETRON HCL 4 MG/2ML IJ SOLN
4.0000 mg | Freq: Once | INTRAMUSCULAR | Status: AC
  Administered 2015-05-18: 4 mg via INTRAVENOUS
  Filled 2015-05-18: qty 2

## 2015-05-18 MED ORDER — IBUPROFEN 800 MG PO TABS: 800.0000 mg | ORAL_TABLET | Freq: Three times a day (TID) | ORAL | Status: DC

## 2015-05-18 MED ORDER — DEXTROSE 5 % IV SOLN
1.0000 g | Freq: Once | INTRAVENOUS | Status: AC
  Administered 2015-05-18: 1 g via INTRAVENOUS
  Filled 2015-05-18: qty 10

## 2015-05-18 MED ORDER — POTASSIUM CHLORIDE CRYS ER 20 MEQ PO TBCR
20.0000 meq | EXTENDED_RELEASE_TABLET | Freq: Once | ORAL | Status: AC
  Administered 2015-05-18: 20 meq via ORAL
  Filled 2015-05-18: qty 1

## 2015-05-18 MED ORDER — CEPHALEXIN 500 MG PO CAPS: 500.0000 mg | ORAL_CAPSULE | Freq: Three times a day (TID) | ORAL | Status: DC

## 2015-05-18 MED ORDER — IBUPROFEN 800 MG PO TABS
800.0000 mg | ORAL_TABLET | Freq: Once | ORAL | Status: AC
  Administered 2015-05-18: 800 mg via ORAL
  Filled 2015-05-18: qty 1

## 2015-05-18 MED ORDER — SODIUM CHLORIDE 0.9 % IV BOLUS (SEPSIS)
500.0000 mL | Freq: Once | INTRAVENOUS | Status: AC
  Administered 2015-05-18: 500 mL via INTRAVENOUS

## 2015-05-18 MED ORDER — KETOROLAC TROMETHAMINE 30 MG/ML IJ SOLN
30.0000 mg | Freq: Once | INTRAMUSCULAR | Status: AC
  Administered 2015-05-18: 30 mg via INTRAVENOUS
  Filled 2015-05-18: qty 1

## 2015-05-18 MED ORDER — ACETAMINOPHEN 325 MG PO TABS
650.0000 mg | ORAL_TABLET | Freq: Once | ORAL | Status: AC
  Administered 2015-05-18: 650 mg via ORAL
  Filled 2015-05-18: qty 2

## 2015-05-18 NOTE — Discharge Instructions (Signed)

## 2015-05-18 NOTE — ED Notes (Signed)
Pt report left sided flank pain that is constant since Sunday. Denies urinary symptoms. Denies injury.

## 2015-05-18 NOTE — ED Provider Notes (Signed)
CSN: [id number]     Arrival date & time 05/18/15  0935 History   First MD Initiated Contact with Patient 05/18/15 1026     Chief Complaint  Patient presents with  . Flank Pain     (Consider location/radiation/quality/duration/timing/severity/associated sxs/prior Treatment) Patient is a 29 y.o. female presenting with flank pain. The history is provided by the patient.  Flank Pain This is a new problem. The current episode started in the past 7 days. The problem occurs constantly. The problem has been unchanged. Associated symptoms include chills. Pertinent negatives include no abdominal pain, anorexia, chest pain, diaphoresis, fever, nausea or vomiting. Nothing aggravates the symptoms. She has tried acetaminophen, heat, ice, NSAIDs and position changes for the symptoms. The treatment provided no relief.    Past Medical History  Diagnosis Date  . No pertinent past medical history   . Pregnancy induced hypertension   . Diabetes mellitus without complication    Past Surgical History  Procedure Laterality Date  . Cervical biopsy  w/ loop electrode excision  2009  . Laparoscopic tubal sterilization with falope rings.  2009  . Tubal ligation     Family History  Problem Relation Age of Onset  . Anesthesia problems Neg Hx    Social History  Substance Use Topics  . Smoking status: Never Smoker   . Smokeless tobacco: Never Used  . Alcohol Use: 3.6 oz/week    6 Shots of liquor per week     Comment: last time was 4 months ago, occassionly   OB History    Gravida Para Term Preterm AB TAB SAB Ectopic Multiple Living   Review of Systems  Constitutional: Positive for chills. Negative for fever and diaphoresis.  Respiratory: Negative for shortness of breath.   Cardiovascular: Negative for chest pain.  Gastrointestinal: Negative for nausea, vomiting, abdominal pain, diarrhea, constipation, blood in stool and anorexia.  Genitourinary: Positive for frequency and flank  pain. Negative for dysuria, hematuria, vaginal bleeding, vaginal discharge and vaginal pain.      Allergies  Review of patient's allergies indicates no known allergies.  Home Medications   Prior to Admission medications   Medication Sig Start Date End Date Taking? Authorizing Provider  ibuprofen (ADVIL,MOTRIN) 600 MG tablet Take 1 tablet (600 mg total) by mouth every 6 (six) hours as needed. 02/21/15   Jean Rosenthal, NP  metFORMIN (GLUCOPHAGE) 500 MG tablet Take 1 tablet (500 mg total) by mouth 2 (two) times daily with a meal. 05/23/13   Tatyana Kirichenko, PA-C  nitrofurantoin, macrocrystal-monohydrate, (MACROBID) 100 MG capsule Take 1 capsule (100 mg total) by mouth 2 (two) times daily. Drink lots of water until urine light yellow 02/21/15   Jean Rosenthal, NP  ondansetron (ZOFRAN) 4 MG tablet Take 1 tablet (4 mg total) by mouth every 6 (six) hours. 02/21/15   Jean Rosenthal, NP  phenazopyridine (PYRIDIUM) 200 MG tablet Take 1 tablet (200 mg total) by mouth 3 (three) times daily. 02/21/15   Jean Rosenthal, NP   BP 146/77 mmHg  Pulse 70  Temp(Src) 98.7 F (37.1 C)  Resp 18  Ht 5' (1.524 m)  Wt 163 lb (73.936 kg)  BMI 31.83 kg/m2  SpO2 100%  LMP 04/04/2015 Physical Exam  Constitutional: She is oriented to person, place, and time. She appears well-developed and well-nourished.  HENT:  Head: Normocephalic and atraumatic.  Mouth/Throat: Oropharynx is clear and moist.  Eyes: Pupils  are equal, round, and reactive to light.  Neck: Normal range of motion. Neck supple.  Cardiovascular: Normal rate, regular rhythm and normal heart sounds.   No murmur heard. Pulmonary/Chest: Effort normal and breath sounds normal. No respiratory distress. She has no wheezes. She has no rales.  Abdominal: Soft. Bowel sounds are normal. She exhibits no distension. There is no tenderness. There is CVA tenderness. There is no rigidity and no guarding.  Musculoskeletal: Normal range of motion.   Lymphadenopathy:    She has no cervical adenopathy.  Neurological: She is alert and oriented to person, place, and time.  Skin: Skin is warm and dry.  Psychiatric: She has a normal mood and affect. Her behavior is normal.    ED Course  Procedures (including critical care time) Labs Review Labs Reviewed  URINALYSIS, ROUTINE W REFLEX MICROSCOPIC (NOT AT [facility]) - Abnormal; Notable for the following:    APPearance CLOUDY (*)    Glucose, UA 100 (*)    Hgb urine dipstick LARGE (*)    Leukocytes, UA MODERATE (*)    All other components within normal limits  BASIC METABOLIC PANEL - Abnormal; Notable for the following:    Potassium 3.0 (*)    Glucose, Bld 213 (*)    BUN <5 (*)    All other components within normal limits  CBC WITH DIFFERENTIAL/PLATELET - Abnormal; Notable for the following:    HCT 34.5 (*)    Lymphs Abs 4.1 (*)    All other components within normal limits  URINE MICROSCOPIC-ADD ON - Abnormal; Notable for the following:    Squamous Epithelial / LPF FEW (*)    Bacteria, UA MANY (*)    All other components within normal limits  URINE CULTURE  POC URINE PREG, ED    Imaging Review No results found. I have personally reviewed and evaluated these images and lab results as part of my medical decision-making.   EKG Interpretation None      MDM   Final diagnoses:  None    Patient presents with 5 day history of constant right flank pain.  Associated with increased urinary frequency and chills.  VSS, NAD, non-toxic.  On exam, she has CVA tenderness on the right side.  Abdomen soft, nondistended.  UA shows evidence of infection.  Urine cx pending.  Suspect pyelonephritis.  Potassium, 3.  Given PO potassium.  Will d/c home with PO potassium.  Cr 0.51, no evidence of kidney dysfunction.  Start IVF, rocephin, and toradol. Upon reassessment, pt's pain improved.  Pt stable for discharge.  VSS.  Discussed return precautions.  Will d/c home with Keflex, suspicion of  pyelonephritis.  PCP follow up.   Case has been discussed with and seen by Dr. Effie Shy who agrees with the above plan for discharge.     Cheri Fowler, PA-C 05/18/15 1443  Mancel Bale, MD 05/21/15 0730

## 2015-05-18 NOTE — ED Notes (Signed)
Pt is in stable condition upon d/c and is escorted from ED via wheelchair. 

## 2015-05-18 NOTE — ED Provider Notes (Signed)
  Face-to-face evaluation   History: She reports right-sided low back pain, present for several days, worse with movement. He has had occasional chills but no documented fever. She denies nausea, vomiting. She continues to take metformin.  Physical exam: Obese, alert, calm, cooperative. Mild lumbar tenderness to palpation. No cough. Vertebral angle tenderness with percussion. Abdomen soft and nontender.    Medical screening examination/treatment/procedure(s) were conducted as a shared visit with non-physician practitioner(s) and myself.  I personally evaluated the patient during the encounter  Mancel Bale, MD 05/21/15 0730

## 2015-05-20 LAB — URINE CULTURE: Culture: >=100000

## 2015-05-22 ENCOUNTER — Telehealth (HOSPITAL_COMMUNITY): Payer: Self-pay

## 2015-05-22 NOTE — Telephone Encounter (Signed)
Post ED Visit - Positive Culture Follow-up  Culture report reviewed by antimicrobial stewardship pharmacist:  [x]  Celedonio Miyamoto, Pharm.D., BCPS []  Georgina Pillion, Pharm.D., BCPS []  [place], [address].D., BCPS, AAHIVP []  Estella Husk, Pharm.D., BCPS, AAHIVP []  [place], [address].D. []  Tennis Must, [place].D.  Positive Urine culture, >/= 100,000 colonies -> Proteus Mirabilis Treated with Cephalexin, organism sensitive to the same and no further patient follow-up is required at this time.  Arvid Right 05/22/2015, 4:52 AM

## 2015-06-20 ENCOUNTER — Emergency Department
Admission: EM | Admit: 2015-06-20 | Discharge: 2015-06-20 | Disposition: A | Discharge status: Home / Self Care | Payer: Medicaid Other | Attending: Emergency Medicine | Admitting: Emergency Medicine

## 2015-06-20 ENCOUNTER — Encounter: Payer: Self-pay | Admitting: *Deleted

## 2015-06-20 DIAGNOSIS — L02811 Cutaneous abscess of head [any part, except face]: Secondary | ICD-10-CM | POA: Diagnosis present

## 2015-06-20 DIAGNOSIS — Z79899 Other long term (current) drug therapy: Secondary | ICD-10-CM | POA: Diagnosis not present

## 2015-06-20 DIAGNOSIS — E119 Type 2 diabetes mellitus without complications: Secondary | ICD-10-CM | POA: Diagnosis not present

## 2015-06-20 DIAGNOSIS — Z792 Long term (current) use of antibiotics: Secondary | ICD-10-CM | POA: Diagnosis not present

## 2015-06-20 DIAGNOSIS — L409 Psoriasis, unspecified: Secondary | ICD-10-CM

## 2015-06-20 DIAGNOSIS — J020 Streptococcal pharyngitis: Secondary | ICD-10-CM

## 2015-06-20 LAB — POCT RAPID STREP A: Streptococcus, Group A Screen (Direct): POSITIVE — AB

## 2015-06-20 MED ORDER — TRIAMCINOLONE ACETONIDE 0.025 % EX OINT
1.0000 | TOPICAL_OINTMENT | Freq: Two times a day (BID) | CUTANEOUS | Status: DC
Start: 2015-06-20 — End: 2016-06-23

## 2015-06-20 MED ORDER — CYPROHEPTADINE HCL 4 MG PO TABS: 4.0000 mg | ORAL_TABLET | Freq: Three times a day (TID) | ORAL | Status: DC | PRN

## 2015-06-20 MED ORDER — AMOXICILLIN-POT CLAVULANATE 875-125 MG PO TABS: 1.0000 | ORAL_TABLET | Freq: Two times a day (BID) | ORAL | Status: DC

## 2015-06-20 NOTE — ED Provider Notes (Signed)
[facility] Emergency Department Provider Note ____________________________________________  Time seen: Approximately 2:52 PM  I have reviewed the triage vital signs and the nursing notes.   HISTORY  Chief Complaint Abscess  HPI BILLIEJEAN CREELY is a 29 y.o. female who presents with 4 days of painful bumps on her scalp and an itchy rash. She denies any discharge from the bumps or the rash and states she has never had anything like this before. She denies recent change in shampoo or soaps, also denies any bugs in the house recently. The rash does itch. She also is complaining of a sore throat. She was diagnosed with strep throat 3 weeks ago and took her full course of abx, she doesn't remember the name. Her son currently has strep throat. She does note some constipation but states normally after she takes an abx she has constipation. Denies fever, chills, cp, sob, abdominal pain, nausea, vomiting, diarrhea.   Past Medical History  Diagnosis Date  . No pertinent past medical history   . Pregnancy induced hypertension   . Diabetes mellitus without complication [facility])     Patient Active Problem List   Diagnosis Date Noted  . Acute bacterial tonsillitis 05/18/2013    Class: Acute  . UTI (lower urinary tract infection) 07/30/2012    Past Surgical History  Procedure Laterality Date  . Cervical biopsy  w/ loop electrode excision  2009  . Laparoscopic tubal sterilization with falope rings.  2009  . Tubal ligation      Current Outpatient Rx  Name  Route  Sig  Dispense  Refill  . amoxicillin-clavulanate (AUGMENTIN) 875-125 MG tablet   Oral   Take 1 tablet by mouth 2 (two) times daily.   20 tablet   0   . cephALEXin (KEFLEX) 500 MG capsule   Oral   Take 1 capsule (500 mg total) by mouth 3 (three) times daily.   30 capsule   0   . cyproheptadine (PERIACTIN) 4 MG tablet   Oral   Take 1 tablet (4 mg total) by mouth 3 (three) times daily as needed for  allergies.   30 tablet   0   . ibuprofen (ADVIL,MOTRIN) 800 MG tablet   Oral   Take 1 tablet (800 mg total) by mouth 3 (three) times daily.   21 tablet   0   . metFORMIN (GLUCOPHAGE) 500 MG tablet   Oral   Take 1 tablet (500 mg total) by mouth 2 (two) times daily with a meal.   60 tablet   1   . nitrofurantoin, macrocrystal-monohydrate, (MACROBID) 100 MG capsule   Oral   Take 1 capsule (100 mg total) by mouth 2 (two) times daily. Drink lots of water until urine light yellow Patient not taking: Reported on 05/18/2015   10 capsule   0   . ondansetron (ZOFRAN) 4 MG tablet   Oral   Take 1 tablet (4 mg total) by mouth every 6 (six) hours. Patient not taking: Reported on 05/18/2015   12 tablet   0   . phenazopyridine (PYRIDIUM) 200 MG tablet   Oral   Take 1 tablet (200 mg total) by mouth 3 (three) times daily. Patient not taking: Reported on 05/18/2015   6 tablet   0   . potassium chloride (K-DUR) 10 MEQ tablet   Oral   Take 1 tablet (10 mEq total) by mouth daily.   30 tablet   0     Allergies Review of patient's allergies  indicates no known allergies.  Family History  Problem Relation Age of Onset  . Anesthesia problems Neg Hx     Social History Social History  Substance Use Topics  . Smoking status: Never Smoker   . Smokeless tobacco: Never Used  . Alcohol Use: 3.6 oz/week    6 Shots of liquor per week     Comment: last time was 4 months ago, occassionly    Review of Systems Constitutional: No fever/chills Eyes: No visual changes. ENT: Positive sore throat.  Cardiovascular: Denies chest pain. Respiratory: Denies shortness of breath. Gastrointestinal: No abdominal pain.  No nausea, no vomiting.  No diarrhea.  Genitourinary: Negative for dysuria. Musculoskeletal: Negative for back pain. Skin: bumps on scalp and an itchy rash on her scalp.  Neurological: Positive for HA. 10-point ROS otherwise  negative.  ____________________________________________   PHYSICAL EXAM:  VITAL SIGNS: ED Triage Vitals  Enc Vitals Group     BP 06/20/15 1352 145/100 mmHg     Pulse Rate 06/20/15 1352 111     Resp 06/20/15 1352 20     Temp 06/20/15 1352 98.1 F (36.7 C)     Temp Source 06/20/15 1352 Oral     SpO2 06/20/15 1352 100 %     Weight 06/20/15 1352 169 lb (76.658 kg)     Height 06/20/15 1352  (1.549 m)   Constitutional: Alert and oriented. Well appearing and in no acute distress. Eyes: Conjunctivae are normal. PERRL. Head: numerous small superficial raised bumps, mostly along hairline. White, crrusting rash with erythematous base along hairline.  Nose: No congestion/rhinnorhea. Mouth/Throat: Mucous membranes are moist.  1+ tonsillar edema with erythema and exudates.  Hematological/Lymphatic/Immunilogical: Positive cervical lymphadenopathy. Cardiovascular: Normal rate, regular rhythm. Grossly normal heart sounds.  Good peripheral circulation. Respiratory: Normal respiratory effort.  No retractions. Lungs CTAB. Gastrointestinal: Soft and nontender. No distention.  Musculoskeletal: No lower extremity tenderness nor edema.  No joint effusions. Neurologic:  Normal speech and language. No gross focal neurologic deficits are appreciated. No gait instability. Skin:  Skin is warm, dry and intact. No rash noted. Psychiatric: Mood and affect are normal. Speech and behavior are normal.  ____________________________________________   LABS (all labs ordered are listed, but only abnormal results are displayed)  Labs Reviewed - No data to display ____________________________________________  PROCEDURES  Procedure(s) performed: None  Critical Care performed: No  ____________________________________________   INITIAL IMPRESSION / ASSESSMENT AND PLAN / ED COURSE  Pertinent labs & imaging results that were available during my care of the patient were reviewed by me and considered in my  medical decision making (see chart for details).  29 yo F who presents with 4 days of crusting rash along hairline with underlying superficial raised bumps as well as a sore throat. Pt was also diagnosed and treated for strep throat 3 weeks ago, son currently has strep throat. Believe rash to be scalp psoriasis, will prescribe triamcinolone cream. Rapid strep was also positive so will prescribe augementin as this is the second time in 1 month that she has strep throat. Pt already has appointment with pcp Monday and will advise her to keep that appointment. Return precautions given and pt voiced understanding of these.  ____________________________________________   FINAL CLINICAL IMPRESSION(S) / ED DIAGNOSES  Final diagnoses:  Strep pharyngitis  Psoriasis of scalp      Joni Reining, PA-C 06/20/15 1529  Minna Antis, MD 06/20/15 1538

## 2015-06-20 NOTE — ED Notes (Signed)
Pt reports knots to back of head and now spreading to other areas of hairline for a couple of months. No known tick bites recently. Pt denies any n/v/d. States her throat hurts when she swallows.

## 2015-06-20 NOTE — Discharge Instructions (Signed)
Pharyngitis Pharyngitis is a sore throat (pharynx). There is redness, pain, and swelling of your throat. HOME CARE   Drink enough fluids to keep your pee (urine) clear or pale yellow.  Only take medicine as told by your doctor.  You may get sick again if you do not take medicine as told. Finish your medicines, even if you start to feel better.  Do not take aspirin.  Rest.  Rinse your mouth (gargle) with salt water ( tsp of salt per 1 qt of water) every 1-2 hours. This will help the pain.  If you are not at risk for choking, you can suck on hard candy or sore throat lozenges. GET HELP IF:  You have large, tender lumps on your neck.  You have a rash.  You cough up green, yellow-brown, or bloody spit. GET HELP RIGHT AWAY IF:   You have a stiff neck.  You drool or cannot swallow liquids.  You throw up (vomit) or are not able to keep medicine or liquids down.  You have very bad pain that does not go away with medicine.  You have problems breathing (not from a stuffy nose). MAKE SURE YOU:   Understand these instructions.  Will watch your condition.  Will get help right away if you are not doing well or get worse.   This information is not intended to replace advice given to you by your health care provider. Make sure you discuss any questions you have with your health care provider.   Document Released: 01/21/2008 Document Revised: 05/25/2013 Document Reviewed: 04/11/2013 Elsevier Interactive Patient Education 2016 [employer]  Psoriasis Psoriasis is a long-term (chronic) condition of skin inflammation. It occurs because your immune system causes skin cells to form too quickly. As a result, too many skin cells grow and create raised, red patches (plaques) that look silvery on your skin. Plaques may appear anywhere on your body. They can be any size or shape. Psoriasis can come and go. The condition varies from mild to very severe. It cannot be passed from one person to  another (not contagious).  CAUSES  The cause of psoriasis is not known, but certain factors can make the condition worse. These include:   Damage or trauma to the skin, such as cuts, scrapes, sunburn, and dryness.  Lack of sunlight.  Certain medicines.  Alcohol.  Tobacco use.  Stress.  Infections caused by bacteria or viruses. RISK FACTORS This condition is more likely to develop in:  People with a family history of psoriasis.  People who are Caucasian.  People who are between the ages of 15-53 and 24-58 years old. SYMPTOMS  There are five different types of psoriasis. You can have more than one type of psoriasis during your life. Types are:   Plaque.  Guttate.  Inverse.  Pustular.  Erythrodermic. Each type of psoriasis has different symptoms.   Plaque psoriasis symptoms include red, raised plaques with a silvery white coating (scale). These plaques may be itchy. Your nails may be pitted and crumbly or fall off.  Guttate psoriasis symptoms include small red spots that often show up on your trunk, arms, and legs. These spots may develop after you have been sick, especially with strep throat.  Inverse psoriasis symptoms include plaques in your underarm area, under your breasts, or on your genitals, groin, or buttocks.  Pustular psoriasis symptoms include pus-filled bumps that are painful, red, and swollen on the palms of your hands or the soles of your feet. You also  may feel exhausted, feverish, weak, or have no appetite.  Erythrodermic psoriasis symptoms include bright red skin that may look burned. You may have a fast heartbeat and a body temperature that is too high or too low. You may be itchy or in pain. DIAGNOSIS  Your health care provider may suspect psoriasis based on your symptoms and family history. Your health care provider will also do a physical exam. This may include a procedure to remove a tissue sample (biopsy) for testing. You may also be referred to a  health care provider who specializes in skin diseases (dermatologist).  TREATMENT There is no cure for this condition, but treatment can help manage it. Goals of treatment include:   Helping your skin heal.  Reducing itching and inflammation.  Slowing the growth of new skin cells.  Helping your immune system respond better to your skin. Treatment varies, depending on the severity of your condition. Treatment may include:   Creams or ointments.  Ultraviolet ray exposure (light therapy). This may include natural sunlight or light therapy in a medical office.  Medicines (systemic therapy). These medicines can help your body better manage skin cell turnover and inflammation. They may be used along with light therapy or ointments. You may also get antibiotic medicines if you have an infection. HOME CARE INSTRUCTIONS Skin Care  Moisturize your skin as needed. Only use moisturizers that have been approved by your health care provider.   Apply cool compresses to the affected areas.   Do not scratch your skin.  Lifestyle  Do not use tobacco products. This includes cigarettes, chewing tobacco, and e-cigarettes. If you need help quitting, ask your health care provider.  Drink little or no alcohol.   Try techniques for stress reduction, such as meditation or yoga.  Get exposure to the sun as told by your health care provider. Do not get sunburned.   Consider joining a psoriasis support group.  Medicines  Take or use over-the-counter and prescription medicines only as told by your health care provider.  If you were prescribed an antibiotic, take or use it as told by your health care provider. Do not stop taking the antibiotic even if your condition starts to improve. General Instructions  Keep a journal to help track what triggers an outbreak. Try to avoid any triggers.   See a counselor or social worker if feelings of sadness, frustration, and hopelessness about your  condition are interfering with your work and relationships.  Keep all follow-up visits as told by your health care provider. This is important. SEEK MEDICAL CARE IF:  Your pain gets worse.  You have increasing redness or warmth in the affected areas.   You have new or worsening pain or stiffness in your joints.  Your nails start to break easily or pull away from the nail bed.   You have a fever.   You feel depressed.   This information is not intended to replace advice given to you by your health care provider. Make sure you discuss any questions you have with your health care provider.   Document Released: 08/01/2000 Document Revised: 04/25/2015 Document Reviewed: 12/20/2014 Elsevier Interactive Patient Education [employer]

## 2015-06-20 NOTE — ED Notes (Signed)
Pt reports having a few abscesses on her head, pt noticed them a few days ago

## 2015-06-25 ENCOUNTER — Ambulatory Visit: Payer: Self-pay | Admitting: Family Medicine

## 2015-07-19 ENCOUNTER — Emergency Department (HOSPITAL_COMMUNITY)
Admission: EM | Admit: 2015-07-19 | Discharge: 2015-07-19 | Disposition: A | Discharge status: Home / Self Care | Payer: Medicaid Other | Attending: Physician Assistant | Admitting: Physician Assistant

## 2015-07-19 ENCOUNTER — Emergency Department (HOSPITAL_COMMUNITY)
Admission: EM | Admit: 2015-07-19 | Discharge: 2015-07-19 | Discharge status: Against Medical Advice | Payer: Medicaid Other | Attending: Emergency Medicine | Admitting: Emergency Medicine

## 2015-07-19 ENCOUNTER — Encounter (HOSPITAL_COMMUNITY): Payer: Self-pay

## 2015-07-19 DIAGNOSIS — S060X0A Concussion without loss of consciousness, initial encounter: Secondary | ICD-10-CM | POA: Diagnosis not present

## 2015-07-19 DIAGNOSIS — S0990XA Unspecified injury of head, initial encounter: Secondary | ICD-10-CM | POA: Insufficient documentation

## 2015-07-19 DIAGNOSIS — Y9389 Activity, other specified: Secondary | ICD-10-CM | POA: Diagnosis not present

## 2015-07-19 DIAGNOSIS — Y92410 Unspecified street and highway as the place of occurrence of the external cause: Secondary | ICD-10-CM | POA: Insufficient documentation

## 2015-07-19 DIAGNOSIS — E119 Type 2 diabetes mellitus without complications: Secondary | ICD-10-CM | POA: Diagnosis not present

## 2015-07-19 DIAGNOSIS — Z7952 Long term (current) use of systemic steroids: Secondary | ICD-10-CM | POA: Insufficient documentation

## 2015-07-19 DIAGNOSIS — S199XXA Unspecified injury of neck, initial encounter: Secondary | ICD-10-CM | POA: Insufficient documentation

## 2015-07-19 DIAGNOSIS — Z792 Long term (current) use of antibiotics: Secondary | ICD-10-CM | POA: Insufficient documentation

## 2015-07-19 DIAGNOSIS — Z791 Long term (current) use of non-steroidal anti-inflammatories (NSAID): Secondary | ICD-10-CM | POA: Insufficient documentation

## 2015-07-19 DIAGNOSIS — Y998 Other external cause status: Secondary | ICD-10-CM | POA: Insufficient documentation

## 2015-07-19 DIAGNOSIS — Z79899 Other long term (current) drug therapy: Secondary | ICD-10-CM | POA: Insufficient documentation

## 2015-07-19 DIAGNOSIS — S3992XA Unspecified injury of lower back, initial encounter: Secondary | ICD-10-CM | POA: Diagnosis not present

## 2015-07-19 MED ORDER — METHOCARBAMOL 500 MG PO TABS: 500.0000 mg | ORAL_TABLET | Freq: Two times a day (BID) | ORAL | Status: DC

## 2015-07-19 MED ORDER — ACETAMINOPHEN 500 MG PO TABS: 500.0000 mg | ORAL_TABLET | Freq: Four times a day (QID) | ORAL | Status: DC | PRN
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
## 2016-02-04 ENCOUNTER — Encounter: Payer: Self-pay | Admitting: Emergency Medicine

## 2016-02-04 ENCOUNTER — Emergency Department
Admission: EM | Admit: 2016-02-04 | Discharge: 2016-02-04 | Disposition: A | Discharge status: Home / Self Care | Payer: Medicaid Other | Attending: Emergency Medicine | Admitting: Emergency Medicine

## 2016-02-04 DIAGNOSIS — Z79899 Other long term (current) drug therapy: Secondary | ICD-10-CM | POA: Insufficient documentation

## 2016-02-04 DIAGNOSIS — Z791 Long term (current) use of non-steroidal anti-inflammatories (NSAID): Secondary | ICD-10-CM | POA: Diagnosis not present

## 2016-02-04 DIAGNOSIS — Z7984 Long term (current) use of oral hypoglycemic drugs: Secondary | ICD-10-CM | POA: Insufficient documentation

## 2016-02-04 DIAGNOSIS — N390 Urinary tract infection, site not specified: Secondary | ICD-10-CM | POA: Diagnosis not present

## 2016-02-04 DIAGNOSIS — E119 Type 2 diabetes mellitus without complications: Secondary | ICD-10-CM | POA: Insufficient documentation

## 2016-02-04 DIAGNOSIS — M545 Low back pain: Secondary | ICD-10-CM | POA: Diagnosis present

## 2016-02-04 LAB — URINALYSIS COMPLETE WITH MICROSCOPIC (ARMC ONLY)
BILIRUBIN URINE: NEGATIVE
NITRITE: NEGATIVE
Protein, ur: 30 mg/dL — AB
SPECIFIC GRAVITY, URINE: 1.023 (ref 1.005–1.030)
pH: 6 (ref 5.0–8.0)

## 2016-02-04 LAB — POCT PREGNANCY, URINE: PREG TEST UR: NEGATIVE

## 2016-02-04 MED ORDER — PHENAZOPYRIDINE HCL 200 MG PO TABS: 200.0000 mg | ORAL_TABLET | Freq: Three times a day (TID) | ORAL | Status: DC | PRN

## 2016-02-04 MED ORDER — CEPHALEXIN 500 MG PO CAPS: 500.0000 mg | ORAL_CAPSULE | Freq: Two times a day (BID) | ORAL | Status: AC

## 2016-02-04 MED ORDER — CEPHALEXIN 500 MG PO CAPS
500.0000 mg | ORAL_CAPSULE | Freq: Once | ORAL | Status: AC
Start: 2016-02-04 — End: 2016-02-04
  Administered 2016-02-04: 500 mg via ORAL
  Filled 2016-02-04: qty 1

## 2016-02-04 NOTE — ED Provider Notes (Signed)
[facility] Emergency Department Provider Note ____________________________________________  Time seen: 1640  I have reviewed the triage vital signs and the nursing notes.  HISTORY  Chief Complaint  Back Pain  HPI Cheryl Mercer is a 30 y.o. female to the ED for evaluation of low back pain that began on Friday. She is also noted in that time some lower pelvic pain as well as some dysuria. She denies any interim fevers, chills, sweats, or gross hematuria. She has used Icy hot and BenGay rubs without lasting benefit. She is also increased her intake of cranberry juice.She rates her overall discomfort at 7/10 in triage.   Past Medical History  Diagnosis Date  . No pertinent past medical history   . Pregnancy induced hypertension   . Diabetes mellitus without complication [facility])     Patient Active Problem List   Diagnosis Date Noted  . Acute bacterial tonsillitis 05/18/2013    Class: Acute  . UTI (lower urinary tract infection) 07/30/2012    Past Surgical History  Procedure Laterality Date  . Cervical biopsy  w/ loop electrode excision  2009  . Laparoscopic tubal sterilization with falope rings.  2009  . Tubal ligation      Current Outpatient Rx  Name  Route  Sig  Dispense  Refill  . acetaminophen (TYLENOL) 500 MG tablet   Oral   Take 1 tablet (500 mg total) by mouth every 6 (six) hours as needed.   30 tablet   0   . amoxicillin-clavulanate (AUGMENTIN) 875-125 MG tablet   Oral   Take 1 tablet by mouth 2 (two) times daily.   20 tablet   0   . cephALEXin (KEFLEX) 500 MG capsule   Oral   Take 1 capsule (500 mg total) by mouth 2 (two) times daily.   13 capsule   0   . cyproheptadine (PERIACTIN) 4 MG tablet   Oral   Take 1 tablet (4 mg total) by mouth 3 (three) times daily as needed for allergies.   30 tablet   0   . ibuprofen (ADVIL,MOTRIN) 800 MG tablet   Oral   Take 1 tablet (800 mg total) by mouth 3 (three) times daily.   21  tablet   0   . metFORMIN (GLUCOPHAGE) 500 MG tablet   Oral   Take 1 tablet (500 mg total) by mouth 2 (two) times daily with a meal.   60 tablet   1   . methocarbamol (ROBAXIN) 500 MG tablet   Oral   Take 1 tablet (500 mg total) by mouth 2 (two) times daily.   20 tablet   0   . nitrofurantoin, macrocrystal-monohydrate, (MACROBID) 100 MG capsule   Oral   Take 1 capsule (100 mg total) by mouth 2 (two) times daily. Drink lots of water until urine light yellow Patient not taking: Reported on 05/18/2015   10 capsule   0   . ondansetron (ZOFRAN) 4 MG tablet   Oral   Take 1 tablet (4 mg total) by mouth every 6 (six) hours. Patient not taking: Reported on 05/18/2015   12 tablet   0   . phenazopyridine (PYRIDIUM) 200 MG tablet   Oral   Take 1 tablet (200 mg total) by mouth 3 (three) times daily as needed for pain.   20 tablet   0   . potassium chloride (K-DUR) 10 MEQ tablet   Oral   Take 1 tablet (10 mEq total) by mouth daily.  30 tablet   0   . triamcinolone (KENALOG) 0.025 % ointment   Topical   Apply 1 application topically 2 (two) times daily.   30 g   0     Allergies Ibuprofen  Family History  Problem Relation Age of Onset  . Anesthesia problems Neg Hx   . Hypertension Mother     Social History Social History  Substance Use Topics  . Smoking status: Never Smoker   . Smokeless tobacco: Never Used  . Alcohol Use: 3.6 oz/week    6 Shots of liquor per week     Comment: last time was 4 months ago, occassionly    Review of Systems  Constitutional: Negative for fever. Cardiovascular: Negative for chest pain. Respiratory: Negative for shortness of breath. Gastrointestinal: Negative for abdominal pain, vomiting and diarrhea. Genitourinary: Positive for dysuria. Musculoskeletal: Positive for back pain. Skin: Negative for rash. Neurological: Negative for headaches, focal weakness or numbness. ____________________________________________  PHYSICAL  EXAM:  VITAL SIGNS: ED Triage Vitals  Enc Vitals Group     BP 02/04/16 1610 128/81 mmHg     Pulse Rate 02/04/16 1610 92     Resp 02/04/16 1610 16     Temp 02/04/16 1610 98.9 F (37.2 C)     Temp Source 02/04/16 1610 Oral     SpO2 02/04/16 1610 96 %     Weight 02/04/16 1604 169 lb (76.658 kg)     Height 02/04/16 1604  (1.575 m)     Head Cir --      Peak Flow --      Pain Score 02/04/16 1603 7     Pain Loc --      Pain Edu? --      Excl. in GC? --     Constitutional: Alert and oriented. Well appearing and in no distress. Head: Normocephalic and atraumatic. Cardiovascular: Normal rate, regular rhythm.  Respiratory: Normal respiratory effort. No wheezes/rales/rhonchi. Gastrointestinal: Soft and nontender. No distention, rebound, guarding, or organomegaly. Positive left CVA tendernes Musculoskeletal: Nontender with normal range of motion in all extremities.  Neurologic:  Normal gait without ataxia. Normal speech and language. No gross focal neurologic deficits are appreciated. ____________________________________________    LABS (pertinent positives/negatives) Labs Reviewed  URINALYSIS COMPLETEWITH MICROSCOPIC ([facility] ONLY) - Abnormal; Notable for the following:    Color, Urine STRAW (*)    APPearance HAZY (*)    Glucose, UA >500 (*)    Ketones, ur TRACE (*)    Hgb urine dipstick 2+ (*)    Protein, ur 30 (*)    Leukocytes, UA 2+ (*)    Bacteria, UA RARE (*)    Squamous Epithelial / LPF 0-5 (*)    All other components within normal limits  URINE CULTURE  POC URINE PREG, ED  POCT PREGNANCY, URINE  ____________________________________________  PROCEDURES  Keflex 500 mg PO ____________________________________________  INITIAL IMPRESSION / ASSESSMENT AND PLAN / ED COURSE  Patient with an acute UTI on presentation. She will be discharged with a prescription for Keflex to dose as directed. She will follow-up with her provider and return to the ED for worsening  symptoms, as discussed. A urine culture is pending at the time of discharge.  ____________________________________________  FINAL CLINICAL IMPRESSION(S) / ED DIAGNOSES  Final diagnoses:  UTI (lower urinary tract infection)     Lissa Hoard, PA-C 02/04/16 1702  Loleta Rose, MD 02/05/16 [phone]

## 2016-02-04 NOTE — ED Notes (Signed)
States she developed lower back pain on Friday  Also having some dysuria

## 2016-02-04 NOTE — Discharge Instructions (Signed)
Urinary Tract Infection A urinary tract infection (UTI) can occur any place along the urinary tract. The tract includes the kidneys, ureters, bladder, and urethra. A type of germ called bacteria often causes a UTI. UTIs are often helped with antibiotic medicine.  HOME CARE   If given, take antibiotics as told by your doctor. Finish them even if you start to feel better.  Drink enough fluids to keep your pee (urine) clear or pale yellow.  Avoid tea, drinks with caffeine, and bubbly (carbonated) drinks.  Pee often. Avoid holding your pee in for a long time.  Pee before and after having sex (intercourse).  Wipe from front to back after you poop (bowel movement) if you are a woman. Use each tissue only once. GET HELP RIGHT AWAY IF:   You have back pain.  You have lower belly (abdominal) pain.  You have chills.  You feel sick to your stomach (nauseous).  You throw up (vomit).  Your burning or discomfort with peeing does not go away.  You have a fever.  Your symptoms are not better in 3 days. MAKE SURE YOU:   Understand these instructions.  Will watch your condition.  Will get help right away if you are not doing well or get worse.   This information is not intended to replace advice given to you by your health care provider. Make sure you discuss any questions you have with your health care provider.   Document Released: 01/21/2008 Document Revised: 08/25/2014 Document Reviewed: 03/04/2012 Elsevier Interactive Patient Education [employer]   Take the prescription meds as directed. Increase intake of cranberry juice. Follow-up with your provider as needed, or return for worsening symptoms.

## 2016-02-07 LAB — URINE CULTURE
Culture: >=100000 — AB
Special Requests: NORMAL

## 2016-05-14 ENCOUNTER — Emergency Department
Admission: EM | Admit: 2016-05-14 | Discharge: 2016-05-14 | Disposition: A | Discharge status: Home / Self Care | Payer: Medicaid Other | Attending: Emergency Medicine | Admitting: Emergency Medicine

## 2016-05-14 ENCOUNTER — Encounter: Payer: Self-pay | Admitting: Emergency Medicine

## 2016-05-14 DIAGNOSIS — I10 Essential (primary) hypertension: Secondary | ICD-10-CM | POA: Diagnosis not present

## 2016-05-14 DIAGNOSIS — Z791 Long term (current) use of non-steroidal anti-inflammatories (NSAID): Secondary | ICD-10-CM | POA: Diagnosis not present

## 2016-05-14 DIAGNOSIS — E119 Type 2 diabetes mellitus without complications: Secondary | ICD-10-CM | POA: Diagnosis not present

## 2016-05-14 DIAGNOSIS — Z7984 Long term (current) use of oral hypoglycemic drugs: Secondary | ICD-10-CM | POA: Insufficient documentation

## 2016-05-14 DIAGNOSIS — G43109 Migraine with aura, not intractable, without status migrainosus: Secondary | ICD-10-CM | POA: Diagnosis present

## 2016-05-14 LAB — POCT PREGNANCY, URINE: Preg Test, Ur: NEGATIVE

## 2016-05-14 MED ORDER — KETOROLAC TROMETHAMINE 30 MG/ML IJ SOLN
30.0000 mg | Freq: Once | INTRAMUSCULAR | Status: AC
  Administered 2016-05-14: 30 mg via INTRAVENOUS
  Filled 2016-05-14: qty 1

## 2016-05-14 MED ORDER — DIPHENHYDRAMINE HCL 50 MG/ML IJ SOLN
50.0000 mg | Freq: Once | INTRAMUSCULAR | Status: AC
  Administered 2016-05-14: 50 mg via INTRAVENOUS
  Filled 2016-05-14: qty 1

## 2016-05-14 MED ORDER — METOCLOPRAMIDE HCL 5 MG/ML IJ SOLN
10.0000 mg | Freq: Once | INTRAMUSCULAR | Status: AC
  Administered 2016-05-14: 10 mg via INTRAVENOUS
  Filled 2016-05-14: qty 2

## 2016-05-14 MED ORDER — SODIUM CHLORIDE 0.9 % IV BOLUS (SEPSIS)
1000.0000 mL | Freq: Once | INTRAVENOUS | Status: AC
  Administered 2016-05-14: 1000 mL via INTRAVENOUS

## 2016-05-14 NOTE — Discharge Instructions (Signed)
You have been seen in the Emergency Department (ED) for a headache. Your evaluation today was overall reassuring. Headaches have many possible causes. Most headaches aren't a sign of a more serious problem, and they will get better on their own.   Follow-up with your doctor in 12-24 hours if you are still having a headache. Otherwise follow up with your doctor in 3-5 days.  For pain take motrin or tylenol as needed  When should you call for help?  Call 911 or return to the ED anytime you think you may need emergency care. For example, call if:  You have signs of a stroke. These may include:  Sudden numbness, paralysis, or weakness in your face, arm, or leg, especially on only one side of your body.  Sudden vision changes.  Sudden trouble speaking.  Sudden confusion or trouble understanding simple statements.  Sudden problems with walking or balance.  A sudden, severe headache that is different from past headaches. You have new or worsening headache Nausea and vomiting associated with your headache Fever, neck stiffness associated with your headache  Call your doctor now or seek immediate medical care if:  You have a new or worse headache.  Your headache gets much worse.  How can you care for yourself at home?  Do not drive if you have taken a prescription pain medicine.  Rest in a quiet, dark room until your headache is gone. Close your eyes and try to relax or go to sleep. Don't watch TV or read.  Put a cold, moist cloth or cold pack on the painful area for 10 to 20 minutes at a time. Put a thin cloth between the cold pack and your skin.  Use a warm, moist towel or a heating pad set on low to relax tight shoulder and neck muscles.  Have someone gently massage your neck and shoulders.  Take pain medicines exactly as directed.  If the doctor gave you a prescription medicine for pain, take it as prescribed.  If you are not taking a prescription pain medicine, ask your doctor if you can  take an over-the-counter medicine. Be careful not to take pain medicine more often than the instructions allow, because you may get worse or more frequent headaches when the medicine wears off.  Do not ignore new symptoms that occur with a headache, such as a fever, weakness or numbness, vision changes, or confusion. These may be signs of a more serious problem.  To prevent headaches  Keep a headache diary so you can figure out what triggers your headaches. Avoiding triggers may help you prevent headaches. Record when each headache began, how long it lasted, and what the pain was like (throbbing, aching, stabbing, or dull). Write down any other symptoms you had with the headache, such as nausea, flashing lights or dark spots, or sensitivity to bright light or loud noise. Note if the headache occurred near your period. List anything that might have triggered the headache, such as certain foods (chocolate, cheese, wine) or odors, smoke, bright light, stress, or lack of sleep.  Find healthy ways to deal with stress. Headaches are most common during or right after stressful times. Take time to relax before and after you do something that has caused a headache in the past.  Try to keep your muscles relaxed by keeping good posture. Check your jaw, face, neck, and shoulder muscles for tension, and try relaxing them. When sitting at a desk, change positions often, and stretch for 30 seconds  each hour.  °Get plenty of sleep and exercise.  °Eat regularly and well. Long periods without food can trigger a headache.  °Treat yourself to a massage. Some people find that regular massages are very helpful in relieving tension.  °Limit caffeine by not drinking too much coffee, tea, or soda. But don't quit caffeine suddenly, because that can also give you headaches.  °Reduce eyestrain from computers by blinking frequently and looking away from the computer screen every so often. Make sure you have proper eyewear and that your  monitor is set up properly, about an arm's length away.  °Seek help if you have depression or anxiety. Your headaches may be linked to these conditions. Treatment can both prevent headaches and help with symptoms of anxiety or depression. ° °

## 2016-05-14 NOTE — ED Triage Notes (Signed)
Pt presents with headache and nausea.  Pt with hx of migraines.

## 2016-05-14 NOTE — ED Provider Notes (Signed)
[facility] Emergency Department Provider Note  ____________________________________________  Time seen: Approximately 7:38 AM  I have reviewed the triage vital signs and the nursing notes.   HISTORY  Chief Complaint Headache   HPI Cheryl Mercer is a 30 y.o. female the history of migraines, diabetes, and hypertension who presents for evaluation of a migraine. Patient reports that her migraine is usually responsive to Excedrin however she's been having a headache since yesterday morning that has not responded to Excedrin this time. She reports that she woke up with a mild left-sided neck and had headache that got progressively worse over the course of a few hours. She reports she is currently has 7/10 pain, throbbing and sharp, located in the left side of her head, associated with photophobia, blurry vision, nausea. She reports this headache is identical to all of her other migraine headaches. She is not on any abortive medications. She does not use hormones. Patient denies weakness or numbness, sudden onset HA or HA with maximal intensity at onset, fever, neck stiffness, history of immunosuppression, Jaw claudication, muscle aches, temporal artery pain, history of other household members with similar symptoms, pregnancy, clotting disorder, trauma, eye pain, recent cervical manipulation, or dizziness with headache.   Past Medical History:  Diagnosis Date  . Diabetes mellitus without complication (HCC)   . No pertinent past medical history   . Pregnancy induced hypertension     Patient Active Problem List   Diagnosis Date Noted  . Acute bacterial tonsillitis 05/18/2013    Class: Acute  . UTI (lower urinary tract infection) 07/30/2012    Past Surgical History:  Procedure Laterality Date  . CERVICAL BIOPSY  W/ LOOP ELECTRODE EXCISION  2009  . Laparoscopic tubal sterilization with Falope rings.  2009  . TUBAL LIGATION      Prior to Admission  medications   Medication Sig Start Date End Date Taking? Authorizing Provider  acetaminophen (TYLENOL) 500 MG tablet Take 1 tablet (500 mg total) by mouth every 6 (six) hours as needed. 07/19/15   Fayrene HelperBowie Tran, PA-C  amoxicillin-clavulanate (AUGMENTIN) 875-125 MG tablet Take 1 tablet by mouth 2 (two) times daily. 06/20/15   Joni Reiningonald K Smith, PA-C  cyproheptadine (PERIACTIN) 4 MG tablet Take 1 tablet (4 mg total) by mouth 3 (three) times daily as needed for allergies. 06/20/15   Joni Reiningonald K Smith, PA-C  ibuprofen (ADVIL,MOTRIN) 800 MG tablet Take 1 tablet (800 mg total) by mouth 3 (three) times daily. 05/18/15   Cheri FowlerKayla Rose, PA-C  metFORMIN (GLUCOPHAGE) 500 MG tablet Take 1 tablet (500 mg total) by mouth 2 (two) times daily with a meal. 05/23/13   Tatyana Kirichenko, PA-C  methocarbamol (ROBAXIN) 500 MG tablet Take 1 tablet (500 mg total) by mouth 2 (two) times daily. 07/19/15   Fayrene HelperBowie Tran, PA-C  nitrofurantoin, macrocrystal-monohydrate, (MACROBID) 100 MG capsule Take 1 capsule (100 mg total) by mouth 2 (two) times daily. Drink lots of water until urine light yellow Patient not taking: Reported on 05/18/2015 02/21/15   Jean RosenthalSusan P Lineberry, NP  ondansetron (ZOFRAN) 4 MG tablet Take 1 tablet (4 mg total) by mouth every 6 (six) hours. Patient not taking: Reported on 05/18/2015 02/21/15   Jean RosenthalSusan P Lineberry, NP  phenazopyridine (PYRIDIUM) 200 MG tablet Take 1 tablet (200 mg total) by mouth 3 (three) times daily as needed for pain. 02/04/16   Jenise V Bacon Menshew, PA-C  potassium chloride (K-DUR) 10 MEQ tablet Take 1 tablet (10 mEq total) by mouth daily. 05/18/15  Cheri Fowler, PA-C  triamcinolone (KENALOG) 0.025 % ointment Apply 1 application topically 2 (two) times daily. 06/20/15   Joni Reining, PA-C    Allergies Ibuprofen  Family History  Problem Relation Age of Onset  . Hypertension Mother   . Anesthesia problems Neg Hx     Social History Social History  Substance Use Topics  . Smoking status: Never Smoker  .  Smokeless tobacco: Never Used  . Alcohol use 3.6 oz/week    6 Shots of liquor per week     Comment: last time was 4 months ago, occassionly    Review of Systems  Constitutional: Negative for fever. Eyes: Negative for visual changes. ENT: Negative for sore throat. Cardiovascular: Negative for chest pain. Respiratory: Negative for shortness of breath. Gastrointestinal: Negative for abdominal pain, vomiting or diarrhea. Genitourinary: Negative for dysuria. Musculoskeletal: Negative for back pain. Skin: Negative for rash. Neurological: Negative for weakness or numbness. + HA  ____________________________________________   PHYSICAL EXAM:  VITAL SIGNS: ED Triage Vitals [05/14/16 0723]  Enc Vitals Group     BP (!) 145/89     Pulse Rate 82     Resp 18     Temp 97.4 F (36.3 C)     Temp Source Oral     SpO2 100 %     Weight 172 lb (78 kg)     Height 5\' 4"  (1.626 m)     Head Circumference      Peak Flow      Pain Score 7     Pain Loc      Pain Edu?      Excl. in GC?     Constitutional: Alert and oriented. Well appearing and in no apparent distress. HEENT:      Head: Normocephalic and atraumatic.         Eyes: Conjunctivae are normal. Sclera is non-icteric. EOMI. PERRL      Mouth/Throat: Mucous membranes are moist.       Neck: Supple with no signs of meningismus. Cardiovascular: Regular rate and rhythm. No murmurs, gallops, or rubs. 2+ symmetrical distal pulses are present in all extremities. No JVD. Respiratory: Normal respiratory effort. Lungs are clear to auscultation bilaterally. No wheezes, crackles, or rhonchi.  Gastrointestinal: Soft, non tender, and non distended with positive bowel sounds. No rebound or guarding. Musculoskeletal: Nontender with normal range of motion in all extremities. No edema, cyanosis, or erythema of extremities. Neurologic: Normal speech and language. A & O x3, PERRL, no nystagmus, CN II-XII intact, motor testing reveals good tone and bulk  throughout. There is no evidence of pronator drift or dysmetria. Muscle strength is 5/5 throughout. Deep tendon reflexes are 2+ throughout with downgoing toes. Sensory examination is intact. Gait is normal. Skin: Skin is warm, dry and intact. No rash noted. Psychiatric: Mood and affect are normal. Speech and behavior are normal.  ____________________________________________   LABS (all labs ordered are listed, but only abnormal results are displayed)  Labs Reviewed  POCT PREGNANCY, URINE   ____________________________________________  EKG  none ____________________________________________  RADIOLOGY  none  ____________________________________________   PROCEDURES  Procedure(s) performed: None Procedures Critical Care performed:  None ____________________________________________   INITIAL IMPRESSION / ASSESSMENT AND PLAN / ED COURSE   30 y.o. female the history of migraines, diabetes, and hypertension who presents for evaluation of a migraine. Low suspicion for more serious or life threatening etiology of HA based on history and exam. No sudden onset thunderclap HA, onset with exertion, vomiting, focal neurologic  deficits, to suggest increased risk of subarachnoid hemorrhage. No fever, neck pain, neck stiffness, or meningismus on exam to suggest meningitis. No fevers, altered mental status, unusual behavior to suggest encephalitis. No focal neurologic deficits by history or exam to suggest central venous thrombosis. No constitutional symptoms including fever, fatigue, weight loss, temporal scalp tenderness, jaw claudication, visual loss, to suggest temporal arteritis. No immunocompromise to suggest increased risk for intracranial infectious disease. No visual changes or findings on ocular exam to suggest acute angle closure glaucoma. No reports of toxic exposures including carbon monoxide or other household members with similar symptoms.   Plan: check Upreg, IVF, IV reglan, IV  benadryl and IV toradol if pregnancy test is negative.  Clinical Course  Comment By Time  HA resolved. Patient requesting discharge. Will discharge with supportive care and close follow-up with primary care doctor.  Nita Sickle, MD 09/27 1054    Pertinent labs & imaging results that were available during my care of the patient were reviewed by me and considered in my medical decision making (see chart for details).    ____________________________________________   FINAL CLINICAL IMPRESSION(S) / ED DIAGNOSES  Final diagnoses:  Migraine with aura and without status migrainosus, not intractable      NEW MEDICATIONS STARTED DURING THIS VISIT:  New Prescriptions   No medications on file     Note:  This document was prepared using Dragon voice recognition software and may include unintentional dictation errors.    Nita Sickle, MD 05/14/16 1135

## 2016-06-23 ENCOUNTER — Encounter: Payer: Self-pay | Admitting: *Deleted

## 2016-06-23 ENCOUNTER — Emergency Department
Admission: EM | Admit: 2016-06-23 | Discharge: 2016-06-23 | Disposition: A | Discharge status: Home / Self Care | Payer: Medicaid Other | Attending: Emergency Medicine | Admitting: Emergency Medicine

## 2016-06-23 DIAGNOSIS — R0981 Nasal congestion: Secondary | ICD-10-CM | POA: Diagnosis present

## 2016-06-23 DIAGNOSIS — B9789 Other viral agents as the cause of diseases classified elsewhere: Secondary | ICD-10-CM

## 2016-06-23 DIAGNOSIS — E119 Type 2 diabetes mellitus without complications: Secondary | ICD-10-CM | POA: Insufficient documentation

## 2016-06-23 DIAGNOSIS — Z79899 Other long term (current) drug therapy: Secondary | ICD-10-CM | POA: Diagnosis not present

## 2016-06-23 DIAGNOSIS — Z7984 Long term (current) use of oral hypoglycemic drugs: Secondary | ICD-10-CM | POA: Diagnosis not present

## 2016-06-23 DIAGNOSIS — J069 Acute upper respiratory infection, unspecified: Secondary | ICD-10-CM

## 2016-06-23 MED ORDER — BENZONATATE 100 MG PO CAPS: ORAL_CAPSULE | ORAL | 0 refills | Status: DC

## 2016-06-23 NOTE — ED Notes (Signed)
States she developed body aches   Nasal congestion and fever couple of days ago   Afebrile on arrival   Lungs clear

## 2016-06-23 NOTE — Discharge Instructions (Signed)
Increase fluids. Continue Tylenol Cold and flu as needed for congestion. You may also use saline nose spray for nasal congestion. Tessalon Perles 1 or 2 every 8 hours as needed for cough. Follow-up with your primary care doctor if any continued problems.

## 2016-06-23 NOTE — ED Provider Notes (Signed)
[facility] Emergency Department Provider Note   ____________________________________________   First MD Initiated Contact with Patient 06/23/16 [phone]     (approximate)  I have reviewed the triage vital signs and the nursing notes.   HISTORY  Chief Complaint Nasal Congestion    HPI Cheryl Mercer is a 30 y.o. female is here complaining of sore throat, congestion, body aches and fever for 3 days. Patient states that no one else in the family is sick at this time. Patient denies smoking. Patient rates her discomfort as 9 out of 10.   Past Medical History:  Diagnosis Date  . Diabetes mellitus without complication (HCC)   . No pertinent past medical history   . Pregnancy induced hypertension     Patient Active Problem List   Diagnosis Date Noted  . Acute bacterial tonsillitis 05/18/2013    Class: Acute  . UTI (lower urinary tract infection) 07/30/2012    Past Surgical History:  Procedure Laterality Date  . CERVICAL BIOPSY  W/ LOOP ELECTRODE EXCISION  2009  . Laparoscopic tubal sterilization with Falope rings.  2009  . TUBAL LIGATION      Prior to Admission medications   Medication Sig Start Date End Date Taking? Authorizing Provider  acetaminophen (TYLENOL) 500 MG tablet Take 1 tablet (500 mg total) by mouth every 6 (six) hours as needed. 07/19/15   Fayrene HelperBowie Tran, PA-C  benzonatate (TESSALON PERLES) 100 MG capsule Take 1 or 2 every 8 hours as needed for cough. 06/23/16   Tommi Rumpshonda L Quintavious Rinck, PA-C  metFORMIN (GLUCOPHAGE) 500 MG tablet Take 1 tablet (500 mg total) by mouth 2 (two) times daily with a meal. 05/23/13   Tatyana Kirichenko, PA-C  phenazopyridine (PYRIDIUM) 200 MG tablet Take 1 tablet (200 mg total) by mouth 3 (three) times daily as needed for pain. 02/04/16   Jenise V Bacon Menshew, PA-C    Allergies Ibuprofen  Family History  Problem Relation Age of Onset  . Hypertension Mother   . Anesthesia problems Neg Hx     Social  History Social History  Substance Use Topics  . Smoking status: Never Smoker  . Smokeless tobacco: Never Used  . Alcohol use No     Comment: last time was 4 months ago, occassionly    Review of Systems Constitutional: Positive fever/chills Eyes: No visual changes. ENT: Positive sore throat. Cardiovascular: Denies chest pain. Respiratory: Denies shortness of breath. Gastrointestinal: No abdominal pain.  No nausea, no vomiting.  Skin: Negative for rash. Neurological: Negative for headaches, focal weakness or numbness.  10-point ROS otherwise negative.  ____________________________________________   PHYSICAL EXAM:  VITAL SIGNS: ED Triage Vitals [06/23/16 0706]  Enc Vitals Group     BP (!) 153/100     Pulse Rate 91     Resp 20     Temp 97.9 F (36.6 C)     Temp Source Oral     SpO2 98 %     Weight 165 lb (74.8 kg)     Height 5\' 2"  (1.575 m)     Head Circumference      Peak Flow      Pain Score 9     Pain Loc      Pain Edu?      Excl. in GC?     Constitutional: Alert and oriented. Well appearing and in no acute distress. Eyes: Conjunctivae are normal. PERRL. EOMI. Head: Atraumatic. Nose: Mild congestion/no rhinnorhea.  EACs and TMs clear bilaterally. Mouth/Throat: Mucous membranes  are moist.  Oropharynx non-erythematous. Nose clear drainage noted. Neck: No stridor.   Hematological/Lymphatic/Immunilogical: No cervical lymphadenopathy. Cardiovascular: Normal rate, regular rhythm. Grossly normal heart sounds.  Good peripheral circulation. Respiratory: Normal respiratory effort.  No retractions. Lungs CTAB. Musculoskeletal: No lower extremity tenderness nor edema.  No joint effusions. Neurologic:  Normal speech and language. No gross focal neurologic deficits are appreciated. No gait instability. Skin:  Skin is warm, dry and intact. No rash noted. Psychiatric: Mood and affect are normal. Speech and behavior are normal.  ____________________________________________    LABS (all labs ordered are listed, but only abnormal results are displayed)  Labs Reviewed - No data to display   PROCEDURES  Procedure(s) performed: None  Procedures  Critical Care performed: No  ____________________________________________   INITIAL IMPRESSION / ASSESSMENT AND PLAN / ED COURSE  Pertinent labs & imaging results that were available during my care of the patient were reviewed by me and considered in my medical decision making (see chart for details).    Clinical Course    Patient was given prescription for Tessalon Perles 1 or 2 every 8 hours as needed for cough. Patient is encouraged to use over-the-counter decongestant such as Sudafed for nasal congestion. We also discussed saline nose spray as needed for nasal congestion. Patient will follow-up with her primary care provider if any continued problems.  ____________________________________________   FINAL CLINICAL IMPRESSION(S) / ED DIAGNOSES  Final diagnoses:  Viral URI with cough      NEW MEDICATIONS STARTED DURING THIS VISIT:  Discharge Medication List as of 06/23/2016  7:52 AM    START taking these medications   Details  benzonatate (TESSALON PERLES) 100 MG capsule Take 1 or 2 every 8 hours as needed for cough., Print         Note:  This document was prepared using Dragon voice recognition software and may include unintentional dictation errors.    Tommi Rumps, PA-C 06/23/16 1217    Emily Filbert, MD 06/23/16 [phone]

## 2016-06-23 NOTE — ED Triage Notes (Signed)
Pt complains of sore throat, congestion, body aches and fever starting Friday

## 2016-08-13 ENCOUNTER — Encounter: Payer: Self-pay | Admitting: Emergency Medicine

## 2016-08-13 ENCOUNTER — Emergency Department
Admission: EM | Admit: 2016-08-13 | Discharge: 2016-08-13 | Disposition: A | Discharge status: Home / Self Care | Payer: Medicaid Other | Attending: Emergency Medicine | Admitting: Emergency Medicine

## 2016-08-13 DIAGNOSIS — Z7984 Long term (current) use of oral hypoglycemic drugs: Secondary | ICD-10-CM | POA: Diagnosis not present

## 2016-08-13 DIAGNOSIS — M5412 Radiculopathy, cervical region: Secondary | ICD-10-CM | POA: Diagnosis not present

## 2016-08-13 DIAGNOSIS — Z79899 Other long term (current) drug therapy: Secondary | ICD-10-CM | POA: Insufficient documentation

## 2016-08-13 DIAGNOSIS — R202 Paresthesia of skin: Secondary | ICD-10-CM | POA: Diagnosis present

## 2016-08-13 DIAGNOSIS — E119 Type 2 diabetes mellitus without complications: Secondary | ICD-10-CM | POA: Insufficient documentation

## 2016-08-13 HISTORY — DX: Other chronic pain: M54.9

## 2016-08-13 HISTORY — DX: Other chronic pain: G89.29

## 2016-08-13 MED ORDER — CYCLOBENZAPRINE HCL 5 MG PO TABS: 5.0000 mg | ORAL_TABLET | Freq: Three times a day (TID) | ORAL | 0 refills | Status: DC | PRN

## 2016-08-13 MED ORDER — PREDNISONE 10 MG (21) PO TBPK: 10.0000 mg | ORAL_TABLET | Freq: Every day | ORAL | 0 refills | Status: DC

## 2016-08-13 NOTE — ED Notes (Signed)
Pt verbalized understanding of discharge instructions. NAD at this time. 

## 2016-08-13 NOTE — ED Provider Notes (Signed)
[facility] Emergency Department Provider Note   ____________________________________________   I have reviewed the triage vital signs and the nursing notes.   HISTORY  Chief Complaint Tingling   History limited by: Not Limited   HPI DMAYA Cheryl Mercer is a 30 y.o. female who presents to the emergency department today because of concerns for tingling. It is located in the left arm. It started early this morning. Patient states that it is accompanied with a sharp pain that is located around her left upper chest and shoulder area. Patient denies any trauma to her arm or neck. She does work at [employer] where she has repetitive motions of that left arm. The patient states that she has never had symptoms like this before. Denies any recent fevers or illness.   Past Medical History:  Diagnosis Date  . Chronic back pain   . Diabetes mellitus without complication (HCC)   . No pertinent past medical history   . Pregnancy induced hypertension     Patient Active Problem List   Diagnosis Date Noted  . Acute bacterial tonsillitis 05/18/2013    Class: Acute  . UTI (lower urinary tract infection) 07/30/2012    Past Surgical History:  Procedure Laterality Date  . CERVICAL BIOPSY  W/ LOOP ELECTRODE EXCISION  2009  . Laparoscopic tubal sterilization with Falope rings.  2009  . TUBAL LIGATION      Prior to Admission medications   Medication Sig Start Date End Date Taking? Authorizing Provider  acetaminophen (TYLENOL) 500 MG tablet Take 1 tablet (500 mg total) by mouth every 6 (six) hours as needed. 07/19/15   Fayrene Helper, PA-C  benzonatate (TESSALON PERLES) 100 MG capsule Take 1 or 2 every 8 hours as needed for cough. 06/23/16   Tommi Rumps, PA-C  metFORMIN (GLUCOPHAGE) 500 MG tablet Take 1 tablet (500 mg total) by mouth 2 (two) times daily with a meal. 05/23/13   Tatyana Kirichenko, PA-C  phenazopyridine (PYRIDIUM) 200 MG tablet Take 1 tablet (200 mg total)  by mouth 3 (three) times daily as needed for pain. 02/04/16   Jenise V Bacon Menshew, PA-C    Allergies Ibuprofen  Family History  Problem Relation Age of Onset  . Hypertension Mother   . Anesthesia problems Neg Hx     Social History Social History  Substance Use Topics  . Smoking status: Never Smoker  . Smokeless tobacco: Never Used  . Alcohol use No     Comment: last time was 4 months ago, occassionly    Review of Systems  Constitutional: Negative for fever. Cardiovascular: Positive for upper left chest pain. Respiratory: Negative for shortness of breath. Gastrointestinal: Negative for abdominal pain, vomiting and diarrhea. Genitourinary: Negative for dysuria. Musculoskeletal: Positive for left shoulder pain. Skin: Negative for rash. Neurological: Negative for headaches, focal weakness. Tingling going down her left arm.  10-point ROS otherwise negative.  ____________________________________________   PHYSICAL EXAM:  VITAL SIGNS: ED Triage Vitals  Enc Vitals Group     BP 08/13/16 0937 (!) 149/105     Pulse Rate 08/13/16 0937 96     Resp 08/13/16 0937 15     Temp 08/13/16 0937 98.1 F (36.7 C)     Temp Source 08/13/16 0937 Oral     SpO2 08/13/16 0937 98 %     Weight 08/13/16 0937 169 lb (76.7 kg)     Height 08/13/16 0937 5' (1.524 m)     Head Circumference --  Peak Flow --      Pain Score 08/13/16 0939 7   Constitutional: Alert and oriented. Well appearing and in no distress. Eyes: Conjunctivae are normal. Normal extraocular movements. ENT   Head: Normocephalic and atraumatic.   Nose: No congestion/rhinnorhea.   Mouth/Throat: Mucous membranes are moist.   Neck: No stridor. Hematological/Lymphatic/Immunilogical: No cervical lymphadenopathy. Cardiovascular: Normal rate, regular rhythm.  No murmurs, rubs, or gallops.  Respiratory: Normal respiratory effort without tachypnea nor retractions. Breath sounds are clear and equal bilaterally. No  wheezes/rales/rhonchi. Gastrointestinal: Soft and non tender. No rebound. No guarding.  Genitourinary: Deferred Musculoskeletal: Normal range of motion in all extremities. Tender to palpation about the left trapezius. Neurologic:  Normal speech and language. No gross focal neurologic deficits are appreciated.  Skin:  Skin is warm, dry and intact. No rash noted. Psychiatric: Mood and affect are normal. Speech and behavior are normal. Patient exhibits appropriate insight and judgment.  ____________________________________________    LABS (pertinent positives/negatives)  None  ____________________________________________   EKG  I, Phineas SemenGraydon Keyonta Barradas, attending physician, personally viewed and interpreted this EKG  EKG Time: 1009 Rate: 85 Rhythm: normal sinus rhythm Axis: normal Intervals: qtc 464 QRS: narrow ST changes: no st elevation Impression: normal ekg   ____________________________________________    RADIOLOGY  None  ____________________________________________   PROCEDURES  Procedures  ____________________________________________   INITIAL IMPRESSION / ASSESSMENT AND PLAN / ED COURSE  Pertinent labs & imaging results that were available during my care of the patient were reviewed by me and considered in my medical decision making (see chart for details).  Patient presented to the emergency department today with concerns for tingling going down her left arm. Exam patient is quite tender around the left trapezius. At this point I think cervical radiculopathy likely. Patient does have repetitive motion of that arm. Will plan on giving the patient some muscle relaxants and steroids. Discussed with patient importance of monitoring her sugars given the steroids propensity to increase blood glucose. Patient verbalized understanding.  ____________________________________________   FINAL CLINICAL IMPRESSION(S) / ED DIAGNOSES  Final diagnoses:  Cervical  radiculopathy     Note: This dictation was prepared with Dragon dictation. Any transcriptional errors that result from this process are unintentional     Phineas SemenGraydon Sedric Guia, MD 08/13/16 1042

## 2016-08-13 NOTE — Discharge Instructions (Signed)
Please seek medical attention for any high fevers, chest pain, shortness of breath, change in behavior, persistent vomiting, bloody stool or any other new or concerning symptoms.  

## 2016-08-13 NOTE — ED Triage Notes (Signed)
Pt to ed with c/o tingling feeling in left arm only.  Pt states she awoke at 1 am with tingling but it would not resolve.  Pt states "I feel lightheaded and out of breath"

## 2016-08-27 ENCOUNTER — Emergency Department
Admission: EM | Admit: 2016-08-27 | Discharge: 2016-08-27 | Disposition: A | Discharge status: Home / Self Care | Payer: Medicaid Other | Attending: Emergency Medicine | Admitting: Emergency Medicine

## 2016-08-27 ENCOUNTER — Encounter: Payer: Self-pay | Admitting: *Deleted

## 2016-08-27 DIAGNOSIS — R591 Generalized enlarged lymph nodes: Secondary | ICD-10-CM | POA: Diagnosis not present

## 2016-08-27 DIAGNOSIS — E119 Type 2 diabetes mellitus without complications: Secondary | ICD-10-CM | POA: Insufficient documentation

## 2016-08-27 DIAGNOSIS — R220 Localized swelling, mass and lump, head: Secondary | ICD-10-CM | POA: Diagnosis present

## 2016-08-27 DIAGNOSIS — L739 Follicular disorder, unspecified: Secondary | ICD-10-CM | POA: Diagnosis not present

## 2016-08-27 DIAGNOSIS — Z7984 Long term (current) use of oral hypoglycemic drugs: Secondary | ICD-10-CM | POA: Insufficient documentation

## 2016-08-27 LAB — BASIC METABOLIC PANEL
Anion gap: 10 (ref 5–15)
CALCIUM: 9.7 mg/dL (ref 8.9–10.3)
CHLORIDE: 99 mmol/L — AB (ref 101–111)
CO2: 29 mmol/L (ref 22–32)
CREATININE: 0.48 mg/dL (ref 0.44–1.00)
GFR calc Af Amer: >60 mL/min (ref >60)
GFR calc non Af Amer: >60 mL/min (ref >60)
Glucose, Bld: 280 mg/dL — ABNORMAL HIGH (ref 65–99)
Potassium: 3.1 mmol/L — ABNORMAL LOW (ref 3.5–5.1)
Sodium: 138 mmol/L (ref 135–145)

## 2016-08-27 LAB — CBC WITH DIFFERENTIAL/PLATELET
BASOS PCT: 1 %
Basophils Absolute: 0.1 10*3/uL (ref 0–0.1)
EOS ABS: 0.2 10*3/uL (ref 0–0.7)
Eosinophils Relative: 2 %
HEMATOCRIT: 39.9 % (ref 35.0–47.0)
HEMOGLOBIN: 14 g/dL (ref 12.0–16.0)
Lymphocytes Relative: 35 %
Lymphs Abs: 3.9 10*3/uL — ABNORMAL HIGH (ref 1.0–3.6)
MCH: 29.6 pg (ref 26.0–34.0)
MCHC: 35.1 g/dL (ref 32.0–36.0)
MCV: 84.3 fL (ref 80.0–100.0)
MONO ABS: 0.5 10*3/uL (ref 0.2–0.9)
MONOS PCT: 5 %
NEUTROS PCT: 57 %
Neutro Abs: 6.3 10*3/uL (ref 1.4–6.5)
Platelets: 329 10*3/uL (ref 150–440)
RBC: 4.73 MIL/uL (ref 3.80–5.20)
RDW: 12.3 % (ref 11.5–14.5)
WBC: 11 10*3/uL (ref 3.6–11.0)

## 2016-08-27 MED ORDER — ONDANSETRON HCL 4 MG PO TABS
4.0000 mg | ORAL_TABLET | Freq: Once | ORAL | Status: AC
  Administered 2016-08-27: 4 mg via ORAL
  Filled 2016-08-27: qty 1

## 2016-08-27 MED ORDER — ONDANSETRON HCL 4 MG PO TABS: 4.0000 mg | ORAL_TABLET | Freq: Every day | ORAL | 0 refills | Status: DC | PRN

## 2016-08-27 MED ORDER — SULFAMETHOXAZOLE-TRIMETHOPRIM 800-160 MG PO TABS
2.0000 | ORAL_TABLET | Freq: Once | ORAL | Status: AC
  Administered 2016-08-27: 2 via ORAL
  Filled 2016-08-27: qty 2

## 2016-08-27 MED ORDER — SULFAMETHOXAZOLE-TRIMETHOPRIM 800-160 MG PO TABS: 2.0000 | ORAL_TABLET | Freq: Two times a day (BID) | ORAL | 0 refills | Status: DC

## 2016-08-27 NOTE — ED Provider Notes (Signed)
[facility] Emergency Department Provider Note  ____________________________________________   First MD Initiated Contact with Patient 08/27/16 1637     (approximate)  I have reviewed the triage vital signs and the nursing notes.   HISTORY  Chief Complaint Headache   HPI Cheryl Mercer is a 31 y.o. female for diabetes who is presenting to the emergency department today with 2 swollen lumps to the back of her head. One on the left and one on the right. She says that she also has had "bumps", both under her hair and on her back. She says that she took her sugar this morning and was in the 300s. She says that she is also been feeling weak as well as not being able to "keep anything down."Denies any ear pain or sore throat.   Past Medical History:  Diagnosis Date  . Chronic back pain   . Diabetes mellitus without complication (HCC)   . No pertinent past medical history   . Pregnancy induced hypertension     Patient Active Problem List   Diagnosis Date Noted  . Acute bacterial tonsillitis 05/18/2013    Class: Acute  . UTI (lower urinary tract infection) 07/30/2012    Past Surgical History:  Procedure Laterality Date  . CERVICAL BIOPSY  W/ LOOP ELECTRODE EXCISION  2009  . Laparoscopic tubal sterilization with Falope rings.  2009  . TUBAL LIGATION      Prior to Admission medications   Medication Sig Start Date End Date Taking? Authorizing Provider  acetaminophen (TYLENOL) 500 MG tablet Take 1 tablet (500 mg total) by mouth every 6 (six) hours as needed. 07/19/15   Fayrene Helper, PA-C  benzonatate (TESSALON PERLES) 100 MG capsule Take 1 or 2 every 8 hours as needed for cough. 06/23/16   Tommi Rumps, PA-C  cyclobenzaprine (FLEXERIL) 5 MG tablet Take 1 tablet (5 mg total) by mouth 3 (three) times daily as needed for muscle spasms. 08/13/16   Phineas Semen, MD  metFORMIN (GLUCOPHAGE) 500 MG tablet Take 1 tablet (500 mg total) by mouth 2 (two)  times daily with a meal. 05/23/13   Tatyana Kirichenko, PA-C  phenazopyridine (PYRIDIUM) 200 MG tablet Take 1 tablet (200 mg total) by mouth 3 (three) times daily as needed for pain. 02/04/16   Jenise V Bacon Menshew, PA-C  predniSONE (STERAPRED UNI-PAK 21 TAB) 10 MG (21) TBPK tablet Take 1 tablet (10 mg total) by mouth daily. Take as directed on packaging 08/13/16   Phineas Semen, MD    Allergies Ibuprofen  Family History  Problem Relation Age of Onset  . Hypertension Mother   . Anesthesia problems Neg Hx     Social History Social History  Substance Use Topics  . Smoking status: Never Smoker  . Smokeless tobacco: Never Used  . Alcohol use No     Comment: last time was 4 months ago, occassionly    Review of Systems Constitutional: No fever/chills Eyes: No visual changes. ENT: No sore throat. Cardiovascular: Denies chest pain. Respiratory: Denies shortness of breath. Gastrointestinal: No abdominal pain.  No nausea, no vomiting.  No diarrhea.  No constipation. Genitourinary: Negative for dysuria. Musculoskeletal: Negative for back pain. Skin: Negative for rash. Neurological: Negative for focal weakness or numbness.  10-point ROS otherwise negative.  ____________________________________________   PHYSICAL EXAM:  VITAL SIGNS: ED Triage Vitals  Enc Vitals Group     BP 08/27/16 1527 (!) 158/102     Pulse Rate 08/27/16 1527 (!) 108  Resp 08/27/16 1527 18     Temp 08/27/16 1527 98.2 F (36.8 C)     Temp Source 08/27/16 1527 Oral     SpO2 08/27/16 1527 100 %     Weight 08/27/16 1528 167 lb (75.8 kg)     Height 08/27/16 1528 5' (1.524 m)     Head Circumference --      Peak Flow --      Pain Score 08/27/16 1547 9     Pain Loc --      Pain Edu? --      Excl. in GC? --     Constitutional: Alert and oriented. Well appearing and in no acute distress. Eyes: Conjunctivae are normal. PERRL. EOMI. Head: Atraumatic. Nose: No congestion/rhinnorhea. Mouth/Throat: Mucous  membranes are moist.   Neck: No stridor.  There is posterior lymphadenopathy to the posterior chains with 1 node on each side. Each node is about 1 cm the nodes are tender and mobile. No fluctuance. Cardiovascular: Normal rate, regular rhythm. Grossly normal heart sounds.   Respiratory: Normal respiratory effort.  No retractions. Lungs CTAB. Gastrointestinal: Soft and nontender. No distention.  Musculoskeletal: No lower extremity tenderness nor edema.  No joint effusions. Neurologic:  Normal speech and language. No gross focal neurologic deficits are appreciated.  Skin:  Small pustules over the patient's upper thoracic back as well as extending up and under the scalp posteriorly. No of fluctuance.No induration.   Psychiatric: Mood and affect are normal. Speech and behavior are normal.  ____________________________________________   LABS (all labs ordered are listed, but only abnormal results are displayed)  Labs Reviewed  CBC WITH DIFFERENTIAL/PLATELET - Abnormal; Notable for the following:       Result Value   Lymphs Abs 3.9 (*)    All other components within normal limits  BASIC METABOLIC PANEL - Abnormal; Notable for the following:    Potassium 3.1 (*)    Chloride 99 (*)    Glucose, Bld 280 (*)    BUN <5 (*)    All other components within normal limits   ____________________________________________  EKG   ____________________________________________  RADIOLOGY   ____________________________________________   PROCEDURES  Procedure(s) performed:   Procedures  Critical Care performed:   ____________________________________________   INITIAL IMPRESSION / ASSESSMENT AND PLAN / ED COURSE  Pertinent labs & imaging results that were available during my care of the patient were reviewed by me and considered in my medical decision making (see chart for details).   Clinical Course    Patient's exam is consistent with folliculitis with reactive lymphadenopathy. We  will check basic labs because of the patient's elevated blood glucose this morning as well as systemic symptoms. If all looks well and her lab results plan will be to discharge to home with Bactrim.   ----------------------------------------- 6:55 PM on 08/27/2016 -----------------------------------------  And without any distress at this time. Reassuring labs without any signs of DKA. Mildly hypokalemic and we will replete this in the emergency department. Heart rate retaken and is 90 bpm. The patient will be discharged home with Bactrim and will follow up with her primary care doctor. Likely reactive lymphadenopathy secondary to folliculitis.  Pt is understanding of the plan and willing to comply.   __ __________________________________________   FINAL CLINICAL IMPRESSION(S) / ED DIAGNOSES  lymphAdenopathy. Folliculitis.    NEW MEDICATIONS STARTED DURING THIS VISIT:  New Prescriptions   No medications on file     Note:  This document was prepared using Dragon voice recognition software  and may include unintentional dictation errors.    Myrna Blazer, MD 08/27/16 [phone]

## 2016-08-27 NOTE — ED Triage Notes (Signed)
States 2 knots on the back of her head, states they feel like they have fever in them, states they have been there for 2 days

## 2017-04-13 ENCOUNTER — Encounter: Payer: Self-pay | Admitting: Medical Oncology

## 2017-04-13 ENCOUNTER — Emergency Department
Admission: EM | Admit: 2017-04-13 | Discharge: 2017-04-13 | Disposition: A | Discharge status: Home / Self Care | Payer: Medicaid Other | Attending: Emergency Medicine | Admitting: Emergency Medicine

## 2017-04-13 DIAGNOSIS — G43909 Migraine, unspecified, not intractable, without status migrainosus: Secondary | ICD-10-CM | POA: Diagnosis not present

## 2017-04-13 DIAGNOSIS — Z7984 Long term (current) use of oral hypoglycemic drugs: Secondary | ICD-10-CM | POA: Diagnosis not present

## 2017-04-13 DIAGNOSIS — Z79899 Other long term (current) drug therapy: Secondary | ICD-10-CM | POA: Insufficient documentation

## 2017-04-13 DIAGNOSIS — R51 Headache: Secondary | ICD-10-CM | POA: Diagnosis present

## 2017-04-13 DIAGNOSIS — G43009 Migraine without aura, not intractable, without status migrainosus: Secondary | ICD-10-CM

## 2017-04-13 DIAGNOSIS — E119 Type 2 diabetes mellitus without complications: Secondary | ICD-10-CM | POA: Insufficient documentation

## 2017-04-13 HISTORY — DX: Migraine, unspecified, not intractable, without status migrainosus: G43.909

## 2017-04-13 MED ORDER — SODIUM CHLORIDE 0.9 % IV BOLUS (SEPSIS)
1000.0000 mL | Freq: Once | INTRAVENOUS | Status: AC
  Administered 2017-04-13: 1000 mL via INTRAVENOUS

## 2017-04-13 MED ORDER — KETOROLAC TROMETHAMINE 30 MG/ML IJ SOLN
30.0000 mg | Freq: Once | INTRAMUSCULAR | Status: AC
  Administered 2017-04-13: 30 mg via INTRAVENOUS
  Filled 2017-04-13: qty 1

## 2017-04-13 MED ORDER — METOCLOPRAMIDE HCL 5 MG/ML IJ SOLN
10.0000 mg | Freq: Once | INTRAMUSCULAR | Status: AC
  Administered 2017-04-13: 10 mg via INTRAVENOUS
  Filled 2017-04-13: qty 2

## 2017-04-13 MED ORDER — DIPHENHYDRAMINE HCL 50 MG/ML IJ SOLN
25.0000 mg | Freq: Once | INTRAMUSCULAR | Status: AC
  Administered 2017-04-13: 25 mg via INTRAVENOUS
  Filled 2017-04-13: qty 1

## 2017-04-13 NOTE — ED Provider Notes (Signed)
[facility] Emergency Department Provider Note ____________________________________________   First MD Initiated Contact with Patient 04/13/17 1112     (approximate)  I have reviewed the triage vital signs and the nursing notes.   HISTORY  Chief Complaint Migraine    HPI Cheryl Mercer is a 31 y.o. female With history of migraine and diabetes who presents with headache for one day, gradual onset, worsening course, bilateral over her temples,throbbing, assoc photophobia, and identical to prior migraines. Not relieved by Excedrin at home. No trauma.  No neck stiffness.  States he had mild subjective fever earlier today.     Past Medical History:  Diagnosis Date  . Chronic back pain   . Diabetes mellitus without complication (HCC)   . Migraines   . No pertinent past medical history   . Pregnancy induced hypertension     Patient Active Problem List   Diagnosis Date Noted  . Acute bacterial tonsillitis 05/18/2013    Class: Acute  . UTI (lower urinary tract infection) 07/30/2012    Past Surgical History:  Procedure Laterality Date  . CERVICAL BIOPSY  W/ LOOP ELECTRODE EXCISION  2009  . Laparoscopic tubal sterilization with Falope rings.  2009  . TUBAL LIGATION      Prior to Admission medications   Medication Sig Start Date End Date Taking? Authorizing Provider  acetaminophen (TYLENOL) 500 MG tablet Take 1 tablet (500 mg total) by mouth every 6 (six) hours as needed. 07/19/15  Yes Fayrene Helper, PA-C  benzonatate (TESSALON PERLES) 100 MG capsule Take 1 or 2 every 8 hours as needed for cough. Patient not taking: Reported on 04/13/2017 06/23/16   Tommi Rumps, PA-C  cyclobenzaprine (FLEXERIL) 5 MG tablet Take 1 tablet (5 mg total) by mouth 3 (three) times daily as needed for muscle spasms. Patient not taking: Reported on 04/13/2017 08/13/16   Phineas Semen, MD  metFORMIN (GLUCOPHAGE) 500 MG tablet Take 1 tablet (500 mg total) by mouth 2  (two) times daily with a meal. Patient not taking: Reported on 04/13/2017 05/23/13   Jaynie Crumble, PA-C  ondansetron (ZOFRAN) 4 MG tablet Take 1 tablet (4 mg total) by mouth daily as needed. Patient not taking: Reported on 04/13/2017 08/27/16   Myrna Blazer, MD  phenazopyridine (PYRIDIUM) 200 MG tablet Take 1 tablet (200 mg total) by mouth 3 (three) times daily as needed for pain. Patient not taking: Reported on 04/13/2017 02/04/16   Menshew, Charlesetta Ivory, PA-C  predniSONE (STERAPRED UNI-PAK 21 TAB) 10 MG (21) TBPK tablet Take 1 tablet (10 mg total) by mouth daily. Take as directed on packaging Patient not taking: Reported on 04/13/2017 08/13/16   Phineas Semen, MD    Allergies Ibuprofen  Family History  Problem Relation Age of Onset  . Hypertension Mother   . Anesthesia problems Neg Hx     Social History Social History  Substance Use Topics  . Smoking status: Never Smoker  . Smokeless tobacco: Never Used  . Alcohol use No     Comment: last time was 4 months ago, occassionly    Review of Systems  Constitutional: Positive for subjective fever. Eyes: positive for photophobia ENT: No sore throat. Cardiovascular: Denies chest pain. Respiratory: Denies shortness of breath. Gastrointestinal: No nausea, no vomiting.  No diarrhea.  Genitourinary: Negative for dysuria.  Musculoskeletal: Negative for back pain. Skin: Negative for rash. Neurological: positive for headache, negative for focal weakness or numbness.   ____________________________________________   PHYSICAL EXAM:  VITAL SIGNS:  ED Triage Vitals  Enc Vitals Group     BP 04/13/17 0918 (!) 156/106     Pulse Rate 04/13/17 0918 100     Resp 04/13/17 0918 16     Temp 04/13/17 0918 98.5 F (36.9 C)     Temp Source 04/13/17 0918 Oral     SpO2 04/13/17 0918 100 %     Weight 04/13/17 0919 158 lb (71.7 kg)     Height 04/13/17 0919 5' (1.524 m)     Head Circumference --      Peak Flow --      Pain  Score 04/13/17 0918 9     Pain Loc --      Pain Edu? --      Excl. in GC? --     Constitutional: Alert and oriented. Well appearing and in no acute distress. Eyes: Conjunctivae are normal. EOMI. PERRLA. Head: Atraumatic. Nose: No congestion/rhinnorhea. Mouth/Throat: Mucous membranes are moist.   Neck: Normal range of motion. o meningeal signs. Cardiovascular:  Good peripheral circulation. Respiratory: Normal respiratory effort.  No retractions.  Gastrointestinal:  No distention.  Genitourinary: No CVA tenderness. Musculoskeletal:  Extremities warm and well perfused.  Neurologic:  Normal speech and language. No gross focal neurologic deficits are appreciated.  Motor and sensory intact in all extremities. Cranial nerves III through XII intact. Normal coordination and finger-to-nose. Skin:  Skin is warm and dry. No rash noted. Psychiatric: Mood and affect are normal. Speech and behavior are normal.  ____________________________________________   LABS (all labs ordered are listed, but only abnormal results are displayed)  Labs Reviewed - No data to display ____________________________________________  EKG   ____________________________________________  RADIOLOGY    ____________________________________________   PROCEDURES  Procedure(s) performed: No    Critical Care performed: No ____________________________________________   INITIAL IMPRESSION / ASSESSMENT AND PLAN / ED COURSE  Pertinent labs & imaging results that were available during my care of the patient were reviewed by me and considered in my medical decision making (see chart for details).  31 year old female history of migraines and diabetes presents with headache since yesterday identical to prior migraines. Patient slightly hypertensive likely due to pain, but other vital signs are normal. On exam, neuro is normal and exam otherwise unremarkable. Presentation consistent with migraine. Although patient  reported subjective fever earlier today she is afebrile here and has no meningeal signs or other symptoms to suggest meningitis.  No findings by history exam to suggest subarachnoid, venous thrombosis, or other concerning cause of the patient's headache. Plan: Will treat symptomatically with fluids, Reglan, Toradol, Benadryl and reassess.    ----------------------------------------- 1:08 PM on 04/13/2017 -----------------------------------------  Patient states she feels much better and would like to go home.  Neuro nonfocal.  ____________________________________________   FINAL CLINICAL IMPRESSION(S) / ED DIAGNOSES  Final diagnoses:  None      NEW MEDICATIONS STARTED DURING THIS VISIT:  New Prescriptions   No medications on file     Note:  This document was prepared using Dragon voice recognition software and may include unintentional dictation errors.    Dionne Bucy, MD 04/13/17 1843

## 2017-04-13 NOTE — ED Triage Notes (Signed)
Pt reports migraine headache that began yesterday with light sensitivity and nausea. Pt reports hx of migraines, usually takes imitrex but has none at home.

## 2017-10-08 DIAGNOSIS — H52223 Regular astigmatism, bilateral: Secondary | ICD-10-CM | POA: Diagnosis not present

## 2017-11-04 ENCOUNTER — Emergency Department
Admission: EM | Admit: 2017-11-04 | Discharge: 2017-11-04 | Disposition: A | Discharge status: Home / Self Care | Payer: Medicaid Other | Attending: Emergency Medicine | Admitting: Emergency Medicine

## 2017-11-04 ENCOUNTER — Other Ambulatory Visit: Payer: Self-pay

## 2017-11-04 ENCOUNTER — Encounter: Payer: Self-pay | Admitting: Emergency Medicine

## 2017-11-04 DIAGNOSIS — R079 Chest pain, unspecified: Secondary | ICD-10-CM | POA: Insufficient documentation

## 2017-11-04 DIAGNOSIS — Z7984 Long term (current) use of oral hypoglycemic drugs: Secondary | ICD-10-CM | POA: Diagnosis not present

## 2017-11-04 DIAGNOSIS — J111 Influenza due to unidentified influenza virus with other respiratory manifestations: Secondary | ICD-10-CM

## 2017-11-04 DIAGNOSIS — Z79899 Other long term (current) drug therapy: Secondary | ICD-10-CM | POA: Insufficient documentation

## 2017-11-04 DIAGNOSIS — E119 Type 2 diabetes mellitus without complications: Secondary | ICD-10-CM | POA: Insufficient documentation

## 2017-11-04 DIAGNOSIS — J1189 Influenza due to unidentified influenza virus with other manifestations: Secondary | ICD-10-CM | POA: Diagnosis not present

## 2017-11-04 DIAGNOSIS — R69 Illness, unspecified: Secondary | ICD-10-CM

## 2017-11-04 DIAGNOSIS — R509 Fever, unspecified: Secondary | ICD-10-CM | POA: Diagnosis present

## 2017-11-04 LAB — INFLUENZA PANEL BY PCR (TYPE A & B)
INFLAPCR: NEGATIVE
INFLBPCR: NEGATIVE

## 2017-11-04 MED ORDER — NAPROXEN 500 MG PO TABS: 500.0000 mg | ORAL_TABLET | Freq: Two times a day (BID) | ORAL | 0 refills | Status: DC

## 2017-11-04 MED ORDER — CYCLOBENZAPRINE HCL 10 MG PO TABS: 10.0000 mg | ORAL_TABLET | Freq: Three times a day (TID) | ORAL | 0 refills | Status: DC | PRN

## 2017-11-04 MED ORDER — KETOROLAC TROMETHAMINE 30 MG/ML IJ SOLN
30.0000 mg | Freq: Once | INTRAMUSCULAR | Status: AC
  Administered 2017-11-04: 30 mg via INTRAVENOUS
  Filled 2017-11-04: qty 1

## 2017-11-04 MED ORDER — PSEUDOEPH-BROMPHEN-DM 30-2-10 MG/5ML PO SYRP: 5.0000 mL | ORAL_SOLUTION | Freq: Four times a day (QID) | ORAL | 0 refills | Status: DC | PRN

## 2017-11-04 MED ORDER — ONDANSETRON HCL 4 MG/2ML IJ SOLN
4.0000 mg | Freq: Once | INTRAMUSCULAR | Status: AC
  Administered 2017-11-04: 4 mg via INTRAVENOUS
  Filled 2017-11-04: qty 2

## 2017-11-04 MED ORDER — SODIUM CHLORIDE 0.9 % IV BOLUS (SEPSIS)
1000.0000 mL | Freq: Once | INTRAVENOUS | Status: AC
  Administered 2017-11-04: 1000 mL via INTRAVENOUS

## 2017-11-04 NOTE — ED Notes (Signed)
See triage note  States she developed some fever and body aches since Saturday  Also had some vomiting on Sunday   Afebrile on arrival   Also has had cough and now having some discomfort in chest with cough

## 2017-11-04 NOTE — ED Provider Notes (Signed)
[facility] Emergency Department Provider Note   ____________________________________________   First MD Initiated Contact with Patient 11/04/17 1320     (approximate)  I have reviewed the triage vital signs and the nursing notes.   HISTORY  Chief Complaint Influenza and Chest Pain    HPI Cheryl Mercer is a 32 y.o. female patient complain of fever, headache, and body aches for 2 days.  Patient no relief over-the-counter cold preparations.  Patient also complained of left-sided chest pain fatigue component to the left arm.  Patient state nausea continues but no vomiting today.  Patient states no diarrhea.  Patient not taking flu shot for this season.  Patient rates her pain as a 10/10.  Patient described the pain is "achy".  Past Medical History:  Diagnosis Date  . Chronic back pain   . Diabetes mellitus without complication (HCC)   . Migraines   . No pertinent past medical history   . Pregnancy induced hypertension     Patient Active Problem List   Diagnosis Date Noted  . Acute bacterial tonsillitis 05/18/2013    Class: Acute  . UTI (lower urinary tract infection) 07/30/2012    Past Surgical History:  Procedure Laterality Date  . CERVICAL BIOPSY  W/ LOOP ELECTRODE EXCISION  2009  . Laparoscopic tubal sterilization with Falope rings.  2009  . TUBAL LIGATION      Prior to Admission medications   Medication Sig Start Date End Date Taking? Authorizing Provider  acetaminophen (TYLENOL) 500 MG tablet Take 1 tablet (500 mg total) by mouth every 6 (six) hours as needed. 07/19/15   Fayrene Helper, PA-C  benzonatate (TESSALON PERLES) 100 MG capsule Take 1 or 2 every 8 hours as needed for cough. Patient not taking: Reported on 04/13/2017 06/23/16   Tommi Rumps, PA-C  brompheniramine-pseudoephedrine-DM 30-2-10 MG/5ML syrup Take 5 mLs by mouth 4 (four) times daily as needed. 11/04/17   Joni Reining, PA-C  cyclobenzaprine (FLEXERIL) 10 MG  tablet Take 1 tablet (10 mg total) by mouth 3 (three) times daily as needed. 11/04/17   Joni Reining, PA-C  cyclobenzaprine (FLEXERIL) 5 MG tablet Take 1 tablet (5 mg total) by mouth 3 (three) times daily as needed for muscle spasms. Patient not taking: Reported on 04/13/2017 08/13/16   Phineas Semen, MD  metFORMIN (GLUCOPHAGE) 500 MG tablet Take 1 tablet (500 mg total) by mouth 2 (two) times daily with a meal. Patient not taking: Reported on 04/13/2017 05/23/13   Jaynie Crumble, PA-C  naproxen (NAPROSYN) 500 MG tablet Take 1 tablet (500 mg total) by mouth 2 (two) times daily with a meal. 11/04/17   Joni Reining, PA-C  ondansetron (ZOFRAN) 4 MG tablet Take 1 tablet (4 mg total) by mouth daily as needed. Patient not taking: Reported on 04/13/2017 08/27/16   Myrna Blazer, MD  phenazopyridine (PYRIDIUM) 200 MG tablet Take 1 tablet (200 mg total) by mouth 3 (three) times daily as needed for pain. Patient not taking: Reported on 04/13/2017 02/04/16   Menshew, Charlesetta Ivory, PA-C  predniSONE (STERAPRED UNI-PAK 21 TAB) 10 MG (21) TBPK tablet Take 1 tablet (10 mg total) by mouth daily. Take as directed on packaging Patient not taking: Reported on 04/13/2017 08/13/16   Phineas Semen, MD    Allergies Ibuprofen  Family History  Problem Relation Age of Onset  . Hypertension Mother   . Anesthesia problems Neg Hx     Social History Social History   Tobacco Use  .  Smoking status: Never Smoker  . Smokeless tobacco: Never Used  Substance Use Topics  . Alcohol use: No    Alcohol/week: 3.6 oz    Types: 6 Shots of liquor per week  . Drug use: No    Review of Systems Constitutional: No fever/chills.  Body ache and fatigue. Eyes: No visual changes. ENT: Sore throat.  Nasal congestion and runny nose. Cardiovascular: Denies chest pain. Respiratory: Denies shortness of breath. Gastrointestinal: No abdominal pain.  No nausea, no vomiting.  No diarrhea.  No  constipation. Genitourinary: Negative for dysuria. Musculoskeletal: Negative for back pain. Skin: Negative for rash. Neurological: Negative for headaches, focal weakness or numbness.   ____________________________________________   PHYSICAL EXAM:  VITAL SIGNS: ED Triage Vitals  Enc Vitals Group     BP 11/04/17 1246 (!) 151/98     Pulse Rate 11/04/17 1246 (!) 116     Resp 11/04/17 1246 20     Temp 11/04/17 1246 98.6 F (37 C)     Temp Source 11/04/17 1246 Oral     SpO2 11/04/17 1246 100 %     Weight 11/04/17 1247 169 lb (76.7 kg)     Height 11/04/17 1247 4\' 9"  (1.448 m)     Head Circumference --      Peak Flow --      Pain Score 11/04/17 1246 10     Pain Loc --      Pain Edu? --      Excl. in GC? --     Constitutional: Alert and oriented.  Appears malaise.   Nose: Edematous nasal turbinates clear rhinorrhea.   Mouth/Throat: Mucous membranes are dry.    Oropharynx non-erythematous. Neck: No stridor. Hematological/Lymphatic/Immunilogical: No cervical lymphadenopathy. Cardiovascular: Tachycardic.  Regular rhythm. Grossly normal heart sounds.  Good peripheral circulation. Respiratory: Normal respiratory effort.  No retractions. Lungs CTAB. Gastrointestinal: Soft and nontender. No distention. No abdominal bruits. No CVA tenderness. Musculoskeletal: No lower extremity tenderness nor edema.  No joint effusions. Neurologic:  Normal speech and language. No gross focal neurologic deficits are appreciated. No gait instability. Skin:  Skin is warm, dry and intact. No rash noted. Psychiatric: Mood and affect are normal. Speech and behavior are normal.  ____________________________________________   LABS (all labs ordered are listed, but only abnormal results are displayed)  Labs Reviewed  INFLUENZA PANEL BY PCR (TYPE A & B)   ____________________________________________  EKG   ____________________________________________  RADIOLOGY  ED MD interpretation:    Official  radiology report(s): No results found.  ____________________________________________   PROCEDURES  Procedure(s) performed: None  Procedures  Critical Care performed: No  ____________________________________________   INITIAL IMPRESSION / ASSESSMENT AND PLAN / ED COURSE  As part of my medical decision making, I reviewed the following data within the electronic MEDICAL RECORD NUMBER    Patient presents with complaint of headache, body aches, fever and nausea for 2 days.  Patient state no relief over-the-counter medications.  Since found to be tachycardic with dry mucous membranes on physical exam.  Patient complained consistent with viral illness.  Discussed negative flu results with patient.  Patient complaining improved status post IV rehydration, Zofran and Toradol.  Patient given discharge care instruction time and advised take medication as directed.  Advised to follow-up with PCP if no improvement in 3-5 days.     ____________________________________________   FINAL CLINICAL IMPRESSION(S) / ED DIAGNOSES  Final diagnoses:  Influenza-like illness     ED Discharge Orders        Ordered  brompheniramine-pseudoephedrine-DM 30-2-10 MG/5ML syrup  4 times daily PRN     11/04/17 1458    naproxen (NAPROSYN) 500 MG tablet  2 times daily with meals     11/04/17 1458    cyclobenzaprine (FLEXERIL) 10 MG tablet  3 times daily PRN     11/04/17 1458       Note:  This document was prepared using Dragon voice recognition software and may include unintentional dictation errors.    Joni Reining, PA-C 11/04/17 1503    Emily Filbert, MD 11/04/17 [phone]

## 2017-11-04 NOTE — ED Triage Notes (Addendum)
Pt in via POV with complaints of fever, headache, body aches x 2 days, reports taking Tylenol cold and flu without any relief.  Pt also complains of intermittent left side chest pain with radiation down left arm.  Pt tachycardic, other vitals WDL.  NAD noted at this time.

## 2018-04-17 ENCOUNTER — Emergency Department (HOSPITAL_COMMUNITY)
Admission: EM | Admit: 2018-04-17 | Discharge: 2018-04-17 | Disposition: A | Discharge status: Home / Self Care | Payer: Medicaid Other | Attending: Emergency Medicine | Admitting: Emergency Medicine

## 2018-04-17 ENCOUNTER — Encounter (HOSPITAL_COMMUNITY): Payer: Self-pay | Admitting: Emergency Medicine

## 2018-04-17 DIAGNOSIS — E119 Type 2 diabetes mellitus without complications: Secondary | ICD-10-CM | POA: Diagnosis not present

## 2018-04-17 DIAGNOSIS — R52 Pain, unspecified: Secondary | ICD-10-CM | POA: Diagnosis not present

## 2018-04-17 DIAGNOSIS — R202 Paresthesia of skin: Secondary | ICD-10-CM | POA: Diagnosis not present

## 2018-04-17 DIAGNOSIS — Z7984 Long term (current) use of oral hypoglycemic drugs: Secondary | ICD-10-CM | POA: Insufficient documentation

## 2018-04-17 DIAGNOSIS — M62838 Other muscle spasm: Secondary | ICD-10-CM | POA: Insufficient documentation

## 2018-04-17 DIAGNOSIS — E876 Hypokalemia: Secondary | ICD-10-CM | POA: Diagnosis not present

## 2018-04-17 DIAGNOSIS — M79662 Pain in left lower leg: Secondary | ICD-10-CM | POA: Diagnosis present

## 2018-04-17 DIAGNOSIS — Z79899 Other long term (current) drug therapy: Secondary | ICD-10-CM | POA: Diagnosis not present

## 2018-04-17 LAB — BASIC METABOLIC PANEL
Anion gap: 10 (ref 5–15)
BUN: 7 mg/dL (ref 6–20)
CO2: 28 mmol/L (ref 22–32)
CREATININE: 0.55 mg/dL (ref 0.44–1.00)
Calcium: 9.1 mg/dL (ref 8.9–10.3)
Chloride: 97 mmol/L — ABNORMAL LOW (ref 98–111)
GFR calc non Af Amer: >60 mL/min (ref >60)
Glucose, Bld: 290 mg/dL — ABNORMAL HIGH (ref 70–99)
Potassium: 3.1 mmol/L — ABNORMAL LOW (ref 3.5–5.1)
SODIUM: 135 mmol/L (ref 135–145)

## 2018-04-17 LAB — MAGNESIUM: MAGNESIUM: 1.5 mg/dL — AB (ref 1.7–2.4)

## 2018-04-17 MED ORDER — MAGNESIUM 30 MG PO TABS: 30.0000 mg | ORAL_TABLET | Freq: Two times a day (BID) | ORAL | 0 refills | Status: AC

## 2018-04-17 MED ORDER — POTASSIUM CHLORIDE CRYS ER 20 MEQ PO TBCR
40.0000 meq | EXTENDED_RELEASE_TABLET | Freq: Once | ORAL | Status: AC
  Administered 2018-04-17: 40 meq via ORAL
  Filled 2018-04-17: qty 2

## 2018-04-17 MED ORDER — ACETAMINOPHEN 325 MG PO TABS
650.0000 mg | ORAL_TABLET | Freq: Once | ORAL | Status: AC
  Administered 2018-04-17: 650 mg via ORAL
  Filled 2018-04-17: qty 2

## 2018-04-17 MED ORDER — CYCLOBENZAPRINE HCL 10 MG PO TABS
10.0000 mg | ORAL_TABLET | Freq: Once | ORAL | Status: AC
  Administered 2018-04-17: 10 mg via ORAL
  Filled 2018-04-17: qty 1

## 2018-04-17 MED ORDER — MAGNESIUM CHLORIDE 64 MG PO TBEC
1.0000 | DELAYED_RELEASE_TABLET | Freq: Once | ORAL | Status: AC
  Administered 2018-04-17: 64 mg via ORAL
  Filled 2018-04-17: qty 1

## 2018-04-17 MED ORDER — CYCLOBENZAPRINE HCL 10 MG PO TABS: 10.0000 mg | ORAL_TABLET | Freq: Two times a day (BID) | ORAL | 0 refills | Status: DC | PRN

## 2018-04-17 MED ORDER — LIDOCAINE 5 % EX PTCH
1.0000 | MEDICATED_PATCH | CUTANEOUS | Status: DC
  Administered 2018-04-17: 1 via TRANSDERMAL
  Filled 2018-04-17: qty 1

## 2018-04-17 MED ORDER — LIDOCAINE 5 % EX PTCH: 1.0000 | MEDICATED_PATCH | CUTANEOUS | 0 refills | Status: AC

## 2018-04-17 MED ORDER — POTASSIUM CHLORIDE ER 10 MEQ PO TBCR: 10.0000 meq | EXTENDED_RELEASE_TABLET | Freq: Two times a day (BID) | ORAL | 0 refills | Status: DC

## 2018-04-17 NOTE — ED Triage Notes (Signed)
Pt states a sharp shooting pain down the left leg, when she stands the pain is worse. The pain starts at the left hip and shoots down into the foot, foot feels like pins and needles.

## 2018-04-17 NOTE — ED Provider Notes (Signed)
Cheryl Mercer [facility] EMERGENCY DEPARTMENT Provider Note   CSN: [id number] Arrival date & time: 04/17/18  1349     History   Chief Complaint Chief Complaint  Patient presents with  . Leg Pain    HPI Cheryl Mercer is a 32 y.o. female.  The history is provided by the patient.  Leg Pain   This is a new problem. The current episode started more than 2 days ago. The problem occurs daily. The problem has not changed since onset.The pain is present in the left lower leg. The quality of the pain is described as sharp. The pain is at a severity of 3/10. The pain is moderate. Associated symptoms include tingling. Pertinent negatives include no numbness, full range of motion and no stiffness. The symptoms are aggravated by standing, activity and contact. She has tried OTC pain medications for the symptoms. The treatment provided mild relief. There has been no history of extremity trauma. Family history is significant for no rheumatoid arthritis and no gout.    Past Medical History:  Diagnosis Date  . Chronic back pain   . Diabetes mellitus without complication (HCC)   . Migraines   . No pertinent past medical history   . Pregnancy induced hypertension     Patient Active Problem List   Diagnosis Date Noted  . Acute bacterial tonsillitis 05/18/2013    Class: Acute  . UTI (lower urinary tract infection) 07/30/2012    Past Surgical History:  Procedure Laterality Date  . CERVICAL BIOPSY  W/ LOOP ELECTRODE EXCISION  2009  . Laparoscopic tubal sterilization with Falope rings.  2009  . TUBAL LIGATION       OB History    Gravida  4   Para  4   Term  4   Preterm      AB      Living  4     SAB      TAB      Ectopic      Multiple      Live Births  4            Home Medications    Prior to Admission medications   Medication Sig Start Date End Date Taking? Authorizing Provider  acetaminophen (TYLENOL) 500 MG tablet Take 1 tablet (500 mg total)  by mouth every 6 (six) hours as needed. Patient taking differently: Take 500 mg by mouth every 6 (six) hours as needed for mild pain.  07/19/15  Yes Fayrene Helper, PA-C  metFORMIN (GLUCOPHAGE) 500 MG tablet Take 1 tablet (500 mg total) by mouth 2 (two) times daily with a meal. 05/23/13  Yes Kirichenko, Tatyana, PA-C  Multiple Vitamin (MULTIVITAMIN) tablet Take 1 tablet by mouth daily. One-A-Day   Yes [provider]  naproxen (NAPROSYN) 500 MG tablet Take 1 tablet (500 mg total) by mouth 2 (two) times daily with a meal. 11/04/17  Yes Joni Reining, PA-C  benzonatate (TESSALON PERLES) 100 MG capsule Take 1 or 2 every 8 hours as needed for cough. Patient not taking: Reported on 04/13/2017 06/23/16   Tommi Rumps, PA-C  brompheniramine-pseudoephedrine-DM 30-2-10 MG/5ML syrup Take 5 mLs by mouth 4 (four) times daily as needed. Patient not taking: Reported on 04/17/2018 11/04/17   Joni Reining, PA-C  cyclobenzaprine (FLEXERIL) 10 MG tablet Take 1 tablet (10 mg total) by mouth 2 (two) times daily as needed for up to 12 doses for muscle spasms. 04/17/18   Cece Milhouse, DO  lidocaine (  LIDODERM) 5 % Place 1 patch onto the skin daily for 6 doses. Remove & Discard patch within 12 hours or as directed by MD 04/17/18 04/23/18  Virgina Norfolk, DO  magnesium 30 MG tablet Take 1 tablet (30 mg total) by mouth 2 (two) times daily for 7 days. 04/17/18 04/24/18  Selso Mannor, DO  ondansetron (ZOFRAN) 4 MG tablet Take 1 tablet (4 mg total) by mouth daily as needed. Patient not taking: Reported on 04/13/2017 08/27/16   Myrna Blazer, MD  phenazopyridine (PYRIDIUM) 200 MG tablet Take 1 tablet (200 mg total) by mouth 3 (three) times daily as needed for pain. Patient not taking: Reported on 04/13/2017 02/04/16   Menshew, Charlesetta Ivory, PA-C  potassium chloride (K-DUR) 10 MEQ tablet Take 1 tablet (10 mEq total) by mouth 2 (two) times daily for 7 days. 04/17/18 04/24/18  Damonte Frieson, DO  predniSONE (STERAPRED  UNI-PAK 21 TAB) 10 MG (21) TBPK tablet Take 1 tablet (10 mg total) by mouth daily. Take as directed on packaging Patient not taking: Reported on 04/13/2017 08/13/16   Phineas Semen, MD    Family History Family History  Problem Relation Age of Onset  . Hypertension Mother   . Anesthesia problems Neg Hx     Social History Social History   Tobacco Use  . Smoking status: Never Smoker  . Smokeless tobacco: Never Used  Substance Use Topics  . Alcohol use: No    Alcohol/week: 6.0 standard drinks    Types: 6 Shots of liquor per week  . Drug use: No    Types: Marijuana     Allergies   Ibuprofen   Review of Systems Review of Systems  Constitutional: Negative for chills and fever.  HENT: Negative for ear pain and sore throat.   Eyes: Negative for pain and visual disturbance.  Respiratory: Negative for cough and shortness of breath.   Cardiovascular: Negative for chest pain and palpitations.  Gastrointestinal: Negative for abdominal pain and vomiting.  Genitourinary: Negative for dysuria and hematuria.  Musculoskeletal: Negative for arthralgias, back pain, gait problem, joint swelling, myalgias, neck pain, neck stiffness and stiffness.  Skin: Negative for color change and rash.  Neurological: Positive for tingling. Negative for seizures, syncope, speech difficulty and numbness.  All other systems reviewed and are negative.    Physical Exam Updated Vital Signs  ED Triage Vitals  Enc Vitals Group     BP 04/17/18 1358 (!) 155/117     Pulse Rate 04/17/18 1358 (!) 117     Resp 04/17/18 1358 20     Temp 04/17/18 1358 98.6 F (37 C)     Temp Source 04/17/18 1358 Oral     SpO2 04/17/18 1358 97 %     Weight --      Height --      Head Circumference --      Peak Flow --      Pain Score 04/17/18 1405 10     Pain Loc --      Pain Edu? --      Excl. in [place]? --     Physical Exam  Constitutional: She is oriented to person, place, and time. She appears well-developed and  well-nourished. No distress.  HENT:  Head: Normocephalic and atraumatic.  Eyes: Pupils are equal, round, and reactive to light. Conjunctivae and EOM are normal.  Neck: Normal range of motion. Neck supple.  Cardiovascular: Normal rate, regular rhythm, normal heart sounds and intact distal pulses.  No murmur heard.  Pulmonary/Chest: Effort normal and breath sounds normal. No respiratory distress.  Abdominal: Soft. There is no tenderness.  Musculoskeletal: Normal range of motion. She exhibits tenderness (TTP in left gluteal area/piriformis). She exhibits no edema or deformity.  Neurological: She is alert and oriented to person, place, and time. No cranial nerve deficit or sensory deficit. She exhibits normal muscle tone.  5+ out of 5 strength throughout lower extremities bilaterally, normal sensation  Skin: Skin is warm and dry. Capillary refill takes less than 2 seconds.  Psychiatric: She has a normal mood and affect.  Nursing note and vitals reviewed.    ED Treatments / Results  Labs (all labs ordered are listed, but only abnormal results are displayed) Labs Reviewed  BASIC METABOLIC PANEL - Abnormal; Notable for the following components:      Result Value   Potassium 3.1 (*)    Chloride 97 (*)    Glucose, Bld 290 (*)    All other components within normal limits  MAGNESIUM - Abnormal; Notable for the following components:   Magnesium 1.5 (*)    All other components within normal limits    EKG None  Radiology No results found.  Procedures Procedures (including critical care time)  Medications Ordered in ED Medications  lidocaine (LIDODERM) 5 % 1 patch (1 patch Transdermal Patch Applied 04/17/18 1528)  potassium chloride SA (K-DUR,KLOR-CON) CR tablet 40 mEq (has no administration in time range)  magnesium chloride (SLOW-MAG) 64 MG SR tablet 64 mg (has no administration in time range)  cyclobenzaprine (FLEXERIL) tablet 10 mg (10 mg Oral Given 04/17/18 1528)  acetaminophen  (TYLENOL) tablet 650 mg (650 mg Oral Given 04/17/18 1528)     Initial Impression / Assessment and Plan / ED Course  I have reviewed the triage vital signs and the nursing notes.  Pertinent labs & imaging results that were available during my care of the patient were reviewed by me and considered in my medical decision making (see chart for details).     IRERI NEWBROUGH is a 32 year old female with history of diabetes who presents to the ED with left leg cramps.  Patient with normal vitals.  No fever.  Patient with pain in the left hip that radiates on her left leg.  No back pain.  Patient denies any trauma, new activities.  Patient denies any fever, chills.  No loss of bowel or bladder.  No neurological symptoms except for tingling sensation on her legs while she walks.  Patient with tenderness in the left piriformis area that reproduces patient's symptoms.  Patient has no signs to suggest DVT on exam.  Patient with good distal pulses.  Neurovascularly intact in the left lower extremity.  No deformities.  No bony tenderness.  Suspect patient likely with musculoskeletal origin of pain.  Given symptoms of muscle cramps will check electrolytes, kidney function.  No concern for rhabdomyolysis given no change in physical activity or high risk features.  Patient given lidocaine patch and Flexeril for presumed muscle spasm.  Lab work showed magnesium of 1.5 and potassium 3.1 which also could attribute to muscle spasm, patient given oral replacement in the ED and given prescription for potassium magnesium supplements..  Given prescription for lidocaine patch and Flexeril.  Recommend Tylenol as well.  Recommend follow-up with primary care doctor as if she continues to have symptoms she would benefit from physical therapy.  Would also recommend repeat electrolytes at primary care doctor.  Discharged from ED in good condition.  Told to return to  ED if symptoms worsen.  Final Clinical Impressions(s) / ED  Diagnoses   Final diagnoses:  Hypokalemia  Hypomagnesemia  Muscle spasm    ED Discharge Orders         Ordered    magnesium 30 MG tablet  2 times daily     04/17/18 1618    potassium chloride (K-DUR) 10 MEQ tablet  2 times daily     04/17/18 1618    cyclobenzaprine (FLEXERIL) 10 MG tablet  2 times daily PRN     04/17/18 1618    lidocaine (LIDODERM) 5 %  Every 24 hours     04/17/18 1618           Virgina Norfolk, DO 04/17/18 1619

## 2018-04-21 DIAGNOSIS — E7800 Pure hypercholesterolemia, unspecified: Secondary | ICD-10-CM | POA: Diagnosis not present

## 2018-04-21 DIAGNOSIS — I10 Essential (primary) hypertension: Secondary | ICD-10-CM | POA: Diagnosis not present

## 2018-04-21 DIAGNOSIS — E119 Type 2 diabetes mellitus without complications: Secondary | ICD-10-CM | POA: Diagnosis not present

## 2018-04-21 DIAGNOSIS — M79605 Pain in left leg: Secondary | ICD-10-CM | POA: Diagnosis not present

## 2018-04-21 DIAGNOSIS — F419 Anxiety disorder, unspecified: Secondary | ICD-10-CM | POA: Diagnosis not present

## 2018-12-10 ENCOUNTER — Encounter (HOSPITAL_COMMUNITY): Payer: Self-pay | Admitting: *Deleted

## 2018-12-10 ENCOUNTER — Ambulatory Visit (HOSPITAL_COMMUNITY)
Admission: EM | Admit: 2018-12-10 | Discharge: 2018-12-10 | Disposition: A | Discharge status: Home / Self Care | Payer: Medicaid Other

## 2018-12-10 ENCOUNTER — Emergency Department (HOSPITAL_COMMUNITY)
Admission: EM | Admit: 2018-12-10 | Discharge: 2018-12-10 | Disposition: A | Discharge status: Home / Self Care | Payer: Medicaid Other | Attending: Emergency Medicine | Admitting: Emergency Medicine

## 2018-12-10 ENCOUNTER — Other Ambulatory Visit: Payer: Self-pay

## 2018-12-10 DIAGNOSIS — R51 Headache: Secondary | ICD-10-CM | POA: Insufficient documentation

## 2018-12-10 DIAGNOSIS — R079 Chest pain, unspecified: Secondary | ICD-10-CM | POA: Insufficient documentation

## 2018-12-10 DIAGNOSIS — M542 Cervicalgia: Secondary | ICD-10-CM | POA: Insufficient documentation

## 2018-12-10 DIAGNOSIS — Z5321 Procedure and treatment not carried out due to patient leaving prior to being seen by health care provider: Secondary | ICD-10-CM | POA: Insufficient documentation

## 2018-12-10 NOTE — ED Notes (Signed)
Patient seen and assessed by Hans Eden, CMA.  Directed to ER.  Accompanied to ER by Melissa.
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
## 2018-12-10 NOTE — ED Notes (Signed)
Pt called for a room no answer

## 2018-12-10 NOTE — ED Notes (Signed)
Pt called for a room, no answer, pt not visualized in the ED waiting

## 2018-12-10 NOTE — ED Notes (Signed)
Pt called for a room.  No answer

## 2019-02-04 ENCOUNTER — Ambulatory Visit (INDEPENDENT_AMBULATORY_CARE_PROVIDER_SITE_OTHER): Payer: Medicaid Other

## 2019-02-04 ENCOUNTER — Encounter (HOSPITAL_COMMUNITY): Payer: Self-pay

## 2019-02-04 ENCOUNTER — Ambulatory Visit (HOSPITAL_COMMUNITY)
Admission: EM | Admit: 2019-02-04 | Discharge: 2019-02-04 | Disposition: A | Discharge status: Home / Self Care | Payer: Medicaid Other | Attending: Internal Medicine | Admitting: Internal Medicine

## 2019-02-04 ENCOUNTER — Other Ambulatory Visit: Payer: Self-pay

## 2019-02-04 DIAGNOSIS — Z1389 Encounter for screening for other disorder: Secondary | ICD-10-CM | POA: Diagnosis not present

## 2019-02-04 DIAGNOSIS — R009 Unspecified abnormalities of heart beat: Secondary | ICD-10-CM | POA: Diagnosis not present

## 2019-02-04 DIAGNOSIS — R079 Chest pain, unspecified: Secondary | ICD-10-CM | POA: Diagnosis not present

## 2019-02-04 DIAGNOSIS — R05 Cough: Secondary | ICD-10-CM

## 2019-02-04 DIAGNOSIS — R202 Paresthesia of skin: Secondary | ICD-10-CM

## 2019-02-04 DIAGNOSIS — R0602 Shortness of breath: Secondary | ICD-10-CM | POA: Insufficient documentation

## 2019-02-04 DIAGNOSIS — R0789 Other chest pain: Secondary | ICD-10-CM

## 2019-02-04 DIAGNOSIS — R030 Elevated blood-pressure reading, without diagnosis of hypertension: Secondary | ICD-10-CM | POA: Insufficient documentation

## 2019-02-04 LAB — POCT URINALYSIS DIP (DEVICE)
Bilirubin Urine: NEGATIVE
Glucose, UA: >=1000 mg/dL — AB
Hgb urine dipstick: NEGATIVE
Leukocytes,Ua: NEGATIVE
Nitrite: NEGATIVE
Protein, ur: 30 mg/dL — AB
Specific Gravity, Urine: 1.015 (ref 1.005–1.030)
Urobilinogen, UA: 0.2 mg/dL (ref 0.0–1.0)
pH: 5 (ref 5.0–8.0)

## 2019-02-04 LAB — GLUCOSE, CAPILLARY: Glucose-Capillary: 407 mg/dL — ABNORMAL HIGH (ref 70–99)

## 2019-02-04 LAB — BRAIN NATRIURETIC PEPTIDE: B Natriuretic Peptide: 1418.4 pg/mL — ABNORMAL HIGH (ref 0.0–100.0)

## 2019-02-04 MED ORDER — METOPROLOL TARTRATE 25 MG PO TABS: 25.0000 mg | ORAL_TABLET | Freq: Two times a day (BID) | ORAL | 0 refills | Status: DC

## 2019-02-04 MED ORDER — METFORMIN HCL 500 MG PO TABS: 500.0000 mg | ORAL_TABLET | Freq: Two times a day (BID) | ORAL | 0 refills | Status: DC

## 2019-02-04 NOTE — Discharge Instructions (Signed)
Your chest xray is reassuring for pneumonia but I am worried about your blood pressure affecting your heart.  We will start a blood pressure medication that I think will help with your symptoms and manage your heart rate and blood pressure.  Please call your primary care doctor for recheck and management of your medications, in the next two weeks.  If develop any worsening of chest pain , shortness of breath , fevers, headache , dizziness or otherwise worsening please go to the ER.

## 2019-02-04 NOTE — ED Triage Notes (Signed)
Pt presents with hot sweats, central chest pressure, and tingling in hands and feet X 5 days.

## 2019-02-04 NOTE — ED Provider Notes (Signed)
[facility]    CSN: [id number] Arrival date & time: 02/04/19  1520     History   Chief Complaint Chief Complaint  Patient presents with  . Chest Pressure  . Hot Sweats  . Tingling in Hands & Feet    HPI Cheryl Mercer is a 33 y.o. female.   Cheryl Mercer presents with complaints of hot flashes, chills and sweating. This was 1 week ago. Tingling sensation to tip of fingers and feet. Monday night 6/15 she woke up feeling like she was shortness of breath . States she feels heaviness to her chest. Slight cough. Yellow production phlegm. Still with hot flashes. Some nasal congestion. Daughter had some congestion. Decreased taste. Chest pain is worse when lay flat. Also worse with activity. No gi/gu complaints. Increased urination, no pain with urination. Not currently working. Her husband works in [employer]eronautical [employer]landsc[employer]. No known covid exposures.  Had used her sons inhaler which did help. It ran out. Tried theraflu and warm liquids which haven't helped. Doesn't smoke. Not on birth control. No leg pain or swelling. No recent travel. Mother with history of blood clots. No personal history of clots. Hx of back pain, DM, migraines.     ROS per HPI, negative if not otherwise mentioned.      Past Medical History:  Diagnosis Date  . Chronic back pain   . Diabetes mellitus without complication (HCC)   . Migraines   . No pertinent past medical history   . Pregnancy induced hypertension     Patient Active Problem List   Diagnosis Date Noted  . Acute bacterial tonsillitis 05/18/2013    Class: Acute  . UTI (lower urinary tract infection) 07/30/2012    Past Surgical History:  Procedure Laterality Date  . CERVICAL BIOPSY  W/ LOOP ELECTRODE EXCISION  2009  . Laparoscopic tubal sterilization with Falope rings.  2009  . TUBAL LIGATION      OB History    Gravida  4   Para  4   Term  4   Preterm      AB      Living  4     SAB      TAB      Ectopic       Multiple      Live Births  4            Home Medications    Prior to Admission medications   Medication Sig Start Date End Date Taking? Authorizing Provider  acetaminophen (TYLENOL) 500 MG tablet Take 1 tablet (500 mg total) by mouth every 6 (six) hours as needed. Patient taking differently: Take 500 mg by mouth every 6 (six) hours as needed for mild pain.  07/19/15   Fayrene Helperran, Bowie, PA-C  benzonatate (TESSALON PERLES) 100 MG capsule Take 1 or 2 every 8 hours as needed for cough. Patient not taking: Reported on 04/13/2017 06/23/16   Tommi RumpsSummers, Rhonda L, PA-C  brompheniramine-pseudoephedrine-DM 30-2-10 MG/5ML syrup Take 5 mLs by mouth 4 (four) times daily as needed. Patient not taking: Reported on 04/17/2018 11/04/17   Joni ReiningSmith, Ronald K, PA-C  cyclobenzaprine (FLEXERIL) 10 MG tablet Take 1 tablet (10 mg total) by mouth 2 (two) times daily as needed for up to 12 doses for muscle spasms. 04/17/18   Curatolo, Adam, DO  metFORMIN (GLUCOPHAGE) 500 MG tablet Take 1 tablet (500 mg total) by mouth 2 (two) times daily with a meal. 02/04/19   Georgetta HaberBurky, Natalie B, NP  metoprolol tartrate (  LOPRESSOR) 25 MG tablet Take 1 tablet (25 mg total) by mouth 2 (two) times daily. 02/04/19   Zigmund Gottron, NP  Multiple Vitamin (MULTIVITAMIN) tablet Take 1 tablet by mouth daily. One-A-Day    [provider]  naproxen (NAPROSYN) 500 MG tablet Take 1 tablet (500 mg total) by mouth 2 (two) times daily with a meal. 11/04/17   Sable Feil, PA-C  ondansetron (ZOFRAN) 4 MG tablet Take 1 tablet (4 mg total) by mouth daily as needed. Patient not taking: Reported on 04/13/2017 08/27/16   Orbie Pyo, MD  phenazopyridine (PYRIDIUM) 200 MG tablet Take 1 tablet (200 mg total) by mouth 3 (three) times daily as needed for pain. Patient not taking: Reported on 04/13/2017 02/04/16   Menshew, Dannielle Karvonen, PA-C  potassium chloride (K-DUR) 10 MEQ tablet Take 1 tablet (10 mEq total) by mouth 2 (two) times daily for 7  days. 04/17/18 04/24/18  Curatolo, Adam, DO  predniSONE (STERAPRED UNI-PAK 21 TAB) 10 MG (21) TBPK tablet Take 1 tablet (10 mg total) by mouth daily. Take as directed on packaging Patient not taking: Reported on 04/13/2017 08/13/16   Nance Pear, MD    Family History Family History  Problem Relation Age of Onset  . Hypertension Mother   . Anesthesia problems Neg Hx     Social History Social History   Tobacco Use  . Smoking status: Never Smoker  . Smokeless tobacco: Never Used  Substance Use Topics  . Alcohol use: No    Alcohol/week: 6.0 standard drinks    Types: 6 Shots of liquor per week  . Drug use: No    Types: Marijuana     Allergies   Ibuprofen   Review of Systems Review of Systems   Physical Exam Triage Vital Signs ED Triage Vitals [02/04/19 1620]  Enc Vitals Group     BP (!) 148/111     Pulse Rate (!) 114     Resp 18     Temp 98.4 F (36.9 C)     Temp src      SpO2 100 %     Weight      Height      Head Circumference      Peak Flow      Pain Score 5     Pain Loc      Pain Edu?      Excl. in [place]?    No data found.  Updated Vital Signs BP (!) 148/111   Pulse (!) 114   Temp 98.4 F (36.9 C)   Resp 18   SpO2 100%   Physical Exam Constitutional:      General: She is not in acute distress.    Appearance: She is well-developed.  Cardiovascular:     Rate and Rhythm: Regular rhythm. Tachycardia present.  Pulmonary:     Effort: Pulmonary effort is normal. No respiratory distress.  Skin:    General: Skin is warm and dry.  Neurological:     Mental Status: She is alert and oriented to person, place, and time.    EKG:  Sinus tachycardia, rate of 110 . Previous EKG was available for review. No stwave changes as interpreted by me.    UC Treatments / Results  Labs (all labs ordered are listed, but only abnormal results are displayed) Labs Reviewed  GLUCOSE, CAPILLARY - Abnormal; Notable for the following components:      Result Value    Glucose-Capillary 407 (*)  All other components within normal limits  POCT URINALYSIS DIP (DEVICE) - Abnormal; Notable for the following components:   Glucose, UA >=1000 (*)    Ketones, ur TRACE (*)    Protein, ur 30 (*)    All other components within normal limits  BRAIN NATRIURETIC PEPTIDE  CBG MONITORING, ED    EKG None  Radiology Dg Chest 2 View  Result Date: 02/04/2019 CLINICAL DATA:  Chest pain and short of breath EXAM: CHEST - 2 VIEW COMPARISON:  None. FINDINGS: No focal opacity or pleural effusion. Mild cardiomegaly. No pneumothorax. IMPRESSION: No active cardiopulmonary disease.  Mild cardiomegaly. Electronically Signed   By: Jasmine Pang M.D.   On: 02/04/2019 17:22    Procedures Procedures (including critical care time)  Medications Ordered in UC Medications - No data to display  Initial Impression / Assessment and Plan / UC Course  I have reviewed the triage vital signs and the nursing notes.  Pertinent labs & imaging results that were available during my care of the patient were reviewed by me and considered in my medical decision making (see chart for details).     Tachycardia, hypertension, with enlarged heart on xray. No pulmonary findings on xray, which indicates lower suspicion of covid-19, as symptoms for the past week. Very low risk of exposure as well. No specific personal risk of PE. No leg pain or swelling. No hypoxia. Symptoms worse with laying flat as well, suggestive of cardiac etiology. bnp collected and pending. Metformin refilled. Metoprolol initiated. Emphasized importance of follow up with PCP as may need echo or referral. Return precautions provided. Patient verbalized understanding and agreeable to plan.    Case discussed with supervising physician Dr. Leonides Grills.  Final Clinical Impressions(s) / UC Diagnoses   Final diagnoses:  SOB (shortness of breath)  Elevated heart rate with elevated blood pressure without diagnosis of hypertension      Discharge Instructions     Your chest xray is reassuring for pneumonia but I am worried about your blood pressure affecting your heart.  We will start a blood pressure medication that I think will help with your symptoms and manage your heart rate and blood pressure.  Please call your primary care doctor for recheck and management of your medications, in the next two weeks.  If develop any worsening of chest pain , shortness of breath , fevers, headache , dizziness or otherwise worsening please go to the ER.     ED Prescriptions    Medication Sig Dispense Auth. Provider   metFORMIN (GLUCOPHAGE) 500 MG tablet Take 1 tablet (500 mg total) by mouth 2 (two) times daily with a meal. 60 tablet Burky, Natalie B, NP   metoprolol tartrate (LOPRESSOR) 25 MG tablet Take 1 tablet (25 mg total) by mouth 2 (two) times daily. 60 tablet Georgetta Haber, NP     Controlled Substance Prescriptions  Controlled Substance Registry consulted? Not Applicable   Georgetta Haber, NP 02/04/19 1800

## 2019-02-07 ENCOUNTER — Telehealth (HOSPITAL_COMMUNITY): Payer: Self-pay | Admitting: Emergency Medicine

## 2019-02-07 NOTE — Telephone Encounter (Signed)
Attempted to reach patient. No answer at this time. Voicemail left.    

## 2019-02-10 ENCOUNTER — Telehealth (HOSPITAL_COMMUNITY): Payer: Self-pay | Admitting: Emergency Medicine

## 2019-02-10 NOTE — Telephone Encounter (Signed)
Patient contacted and made aware of all results, all questions answered.   

## 2019-03-22 ENCOUNTER — Emergency Department (HOSPITAL_COMMUNITY): Payer: Medicaid Other

## 2019-03-22 ENCOUNTER — Inpatient Hospital Stay (HOSPITAL_COMMUNITY)
Admission: EM | Admit: 2019-03-22 | Discharge: 2019-03-28 | DRG: 287 | Disposition: A | Discharge status: Home / Self Care | Payer: Medicaid Other | Attending: Family Medicine | Admitting: Family Medicine

## 2019-03-22 ENCOUNTER — Encounter (HOSPITAL_COMMUNITY): Payer: Self-pay | Admitting: Emergency Medicine

## 2019-03-22 DIAGNOSIS — F1590 Other stimulant use, unspecified, uncomplicated: Secondary | ICD-10-CM | POA: Diagnosis present

## 2019-03-22 DIAGNOSIS — F1490 Cocaine use, unspecified, uncomplicated: Secondary | ICD-10-CM | POA: Diagnosis present

## 2019-03-22 DIAGNOSIS — I959 Hypotension, unspecified: Secondary | ICD-10-CM | POA: Diagnosis not present

## 2019-03-22 DIAGNOSIS — Z888 Allergy status to other drugs, medicaments and biological substances status: Secondary | ICD-10-CM

## 2019-03-22 DIAGNOSIS — R739 Hyperglycemia, unspecified: Secondary | ICD-10-CM

## 2019-03-22 DIAGNOSIS — M25561 Pain in right knee: Secondary | ICD-10-CM | POA: Diagnosis not present

## 2019-03-22 DIAGNOSIS — E119 Type 2 diabetes mellitus without complications: Secondary | ICD-10-CM | POA: Diagnosis present

## 2019-03-22 DIAGNOSIS — R600 Localized edema: Secondary | ICD-10-CM

## 2019-03-22 DIAGNOSIS — M7989 Other specified soft tissue disorders: Secondary | ICD-10-CM | POA: Diagnosis not present

## 2019-03-22 DIAGNOSIS — Z79899 Other long term (current) drug therapy: Secondary | ICD-10-CM

## 2019-03-22 DIAGNOSIS — Z7984 Long term (current) use of oral hypoglycemic drugs: Secondary | ICD-10-CM

## 2019-03-22 DIAGNOSIS — W109XXA Fall (on) (from) unspecified stairs and steps, initial encounter: Secondary | ICD-10-CM | POA: Diagnosis present

## 2019-03-22 DIAGNOSIS — I428 Other cardiomyopathies: Secondary | ICD-10-CM | POA: Diagnosis present

## 2019-03-22 DIAGNOSIS — I2510 Atherosclerotic heart disease of native coronary artery without angina pectoris: Secondary | ICD-10-CM | POA: Diagnosis present

## 2019-03-22 DIAGNOSIS — Z8249 Family history of ischemic heart disease and other diseases of the circulatory system: Secondary | ICD-10-CM

## 2019-03-22 DIAGNOSIS — R809 Proteinuria, unspecified: Secondary | ICD-10-CM | POA: Diagnosis present

## 2019-03-22 DIAGNOSIS — I110 Hypertensive heart disease with heart failure: Principal | ICD-10-CM | POA: Diagnosis present

## 2019-03-22 DIAGNOSIS — R000 Tachycardia, unspecified: Secondary | ICD-10-CM | POA: Diagnosis not present

## 2019-03-22 DIAGNOSIS — E8809 Other disorders of plasma-protein metabolism, not elsewhere classified: Secondary | ICD-10-CM | POA: Diagnosis present

## 2019-03-22 DIAGNOSIS — E876 Hypokalemia: Secondary | ICD-10-CM | POA: Diagnosis not present

## 2019-03-22 DIAGNOSIS — R079 Chest pain, unspecified: Secondary | ICD-10-CM | POA: Diagnosis not present

## 2019-03-22 DIAGNOSIS — G43909 Migraine, unspecified, not intractable, without status migrainosus: Secondary | ICD-10-CM | POA: Diagnosis present

## 2019-03-22 DIAGNOSIS — Z20828 Contact with and (suspected) exposure to other viral communicable diseases: Secondary | ICD-10-CM | POA: Diagnosis present

## 2019-03-22 DIAGNOSIS — R609 Edema, unspecified: Secondary | ICD-10-CM

## 2019-03-22 DIAGNOSIS — E1165 Type 2 diabetes mellitus with hyperglycemia: Secondary | ICD-10-CM | POA: Diagnosis present

## 2019-03-22 DIAGNOSIS — I5043 Acute on chronic combined systolic (congestive) and diastolic (congestive) heart failure: Secondary | ICD-10-CM | POA: Diagnosis present

## 2019-03-22 DIAGNOSIS — I160 Hypertensive urgency: Secondary | ICD-10-CM | POA: Diagnosis present

## 2019-03-22 DIAGNOSIS — R0602 Shortness of breath: Secondary | ICD-10-CM | POA: Diagnosis not present

## 2019-03-22 DIAGNOSIS — I10 Essential (primary) hypertension: Secondary | ICD-10-CM

## 2019-03-22 DIAGNOSIS — I517 Cardiomegaly: Secondary | ICD-10-CM | POA: Diagnosis present

## 2019-03-22 DIAGNOSIS — I5041 Acute combined systolic (congestive) and diastolic (congestive) heart failure: Secondary | ICD-10-CM | POA: Diagnosis present

## 2019-03-22 LAB — BASIC METABOLIC PANEL
Anion gap: 12 (ref 5–15)
BUN: <5 mg/dL — ABNORMAL LOW (ref 6–20)
CO2: 22 mmol/L (ref 22–32)
Calcium: 8.4 mg/dL — ABNORMAL LOW (ref 8.9–10.3)
Chloride: 98 mmol/L (ref 98–111)
Creatinine, Ser: 0.71 mg/dL (ref 0.44–1.00)
GFR calc Af Amer: >60 mL/min (ref >60)
GFR calc non Af Amer: >60 mL/min (ref >60)
Glucose, Bld: 530 mg/dL (ref 70–99)
Potassium: 3.6 mmol/L (ref 3.5–5.1)
Sodium: 132 mmol/L — ABNORMAL LOW (ref 135–145)

## 2019-03-22 LAB — TROPONIN I (HIGH SENSITIVITY)
Troponin I (High Sensitivity): 12 ng/L (ref <18)
Troponin I (High Sensitivity): 15 ng/L (ref <18)

## 2019-03-22 LAB — URINALYSIS, ROUTINE W REFLEX MICROSCOPIC
Bilirubin Urine: NEGATIVE
Glucose, UA: >=500 mg/dL — AB
Ketones, ur: NEGATIVE mg/dL
Leukocytes,Ua: NEGATIVE
Nitrite: NEGATIVE
Protein, ur: >=300 mg/dL — AB
Specific Gravity, Urine: 1.036 — ABNORMAL HIGH (ref 1.005–1.030)
pH: 5 (ref 5.0–8.0)

## 2019-03-22 LAB — I-STAT BETA HCG BLOOD, ED (MC, WL, AP ONLY): I-stat hCG, quantitative: <5 m[IU]/mL (ref <5)

## 2019-03-22 LAB — CBC
HCT: 39.7 % (ref 36.0–46.0)
Hemoglobin: 12.8 g/dL (ref 12.0–15.0)
MCH: 29.2 pg (ref 26.0–34.0)
MCHC: 32.2 g/dL (ref 30.0–36.0)
MCV: 90.4 fL (ref 80.0–100.0)
Platelets: 292 10*3/uL (ref 150–400)
RBC: 4.39 MIL/uL (ref 3.87–5.11)
RDW: 13.3 % (ref 11.5–15.5)
WBC: 6.9 10*3/uL (ref 4.0–10.5)
nRBC: 0 % (ref 0.0–0.2)

## 2019-03-22 LAB — BRAIN NATRIURETIC PEPTIDE: B Natriuretic Peptide: 962.3 pg/mL — ABNORMAL HIGH (ref 0.0–100.0)

## 2019-03-22 MED ORDER — SODIUM CHLORIDE 0.9 % IV BOLUS
250.0000 mL | Freq: Once | INTRAVENOUS | Status: AC
  Administered 2019-03-22: 250 mL via INTRAVENOUS

## 2019-03-22 MED ORDER — ACETAMINOPHEN 325 MG PO TABS
650.0000 mg | ORAL_TABLET | Freq: Once | ORAL | Status: AC
  Administered 2019-03-22: 23:00:00 650 mg via ORAL
  Filled 2019-03-22: qty 2

## 2019-03-22 MED ORDER — SODIUM CHLORIDE 0.9% FLUSH
3.0000 mL | Freq: Once | INTRAVENOUS | Status: AC
  Administered 2019-03-22: 3 mL via INTRAVENOUS

## 2019-03-22 MED ORDER — IOHEXOL 350 MG/ML SOLN
80.0000 mL | Freq: Once | INTRAVENOUS | Status: AC | PRN
  Administered 2019-03-22: 80 mL via INTRAVENOUS

## 2019-03-22 MED ORDER — INSULIN ASPART 100 UNIT/ML ~~LOC~~ SOLN
10.0000 [IU] | Freq: Once | SUBCUTANEOUS | Status: AC
  Administered 2019-03-22: 10 [IU] via INTRAVENOUS

## 2019-03-22 NOTE — ED Notes (Signed)
Patient transported to CT 

## 2019-03-22 NOTE — ED Provider Notes (Addendum)
[facility] Provider Note   CSN: [id number] Arrival date & time: 03/22/19  1628    History   Chief Complaint Chief Complaint  Patient presents with  . Leg Swelling    HPI Cheryl Mercer is a 33 y.o. female past medical history of diabetes on metformin, migraines, hypertension presents emergency department today with chief complaint of bilateral lower extremity swelling and shortness of breath.  This been going on x1 month but she noticed it has gotten worse over the last 1 week.  Today when she was walking up the stairs she states her legs felt so heavy that she fell and landed on her right knee.  She has been ambulatory since the fall but states her right knee aches.  She did not hit her head, no LOC.  She has been wearing compression stockings, soaking her feet in Epsom salt and elevating her feet without any changes in the swelling.  Patient states her shortness of breath is worse with exertion and bending over. She reports associated chest pain. She states pain is intermittent and feels Mercer a sharp pain in the center of her chest. The pain lasts less than 1 minute. No modifying or radiating factors.  She has not taken any medications for her symptoms prior to arrival. She denies fever, chills, diaphoresis, abdominal pain, urinary frequency, diarrhea, recent illness. Also denies exogenous estrogen use,  recent travel or immobilization, history of PE or DVT, family or personal history of bleeding or clotting disorders, cough or hemoptysis  History provided by patient with additional history obtained from chart review.    Past Medical History:  Diagnosis Date  . Chronic back pain   . Diabetes mellitus without complication (HCC)   . Migraines   . No pertinent past medical history   . Pregnancy induced hypertension     Patient Active Problem List   Diagnosis Date Noted  . Acute bacterial tonsillitis 05/18/2013    Class: Acute  . UTI  (lower urinary tract infection) 07/30/2012    Past Surgical History:  Procedure Laterality Date  . CERVICAL BIOPSY  W/ LOOP ELECTRODE EXCISION  2009  . Laparoscopic tubal sterilization with Falope rings.  2009  . TUBAL LIGATION       OB History    Gravida  4   Para  4   Term  4   Preterm      AB      Living  4     SAB      TAB      Ectopic      Multiple      Live Births  4            Home Medications    Prior to Admission medications   Medication Sig Start Date End Date Taking? Authorizing Provider  acetaminophen (TYLENOL) 500 MG tablet Take 1 tablet (500 mg total) by mouth every 6 (six) hours as needed. Patient taking differently: Take 500 mg by mouth every 6 (six) hours as needed for mild pain.  07/19/15   Fayrene Helperran, Bowie, PA-C  benzonatate (TESSALON PERLES) 100 MG capsule Take 1 or 2 every 8 hours as needed for cough. Patient not taking: Reported on 04/13/2017 06/23/16   Tommi RumpsSummers, Rhonda L, PA-C  brompheniramine-pseudoephedrine-DM 30-2-10 MG/5ML syrup Take 5 mLs by mouth 4 (four) times daily as needed. Patient not taking: Reported on 04/17/2018 11/04/17   Joni ReiningSmith, Ronald K, PA-C  cyclobenzaprine (FLEXERIL) 10 MG tablet Take 1  tablet (10 mg total) by mouth 2 (two) times daily as needed for up to 12 doses for muscle spasms. 04/17/18   Curatolo, Adam, DO  metFORMIN (GLUCOPHAGE) 500 MG tablet Take 1 tablet (500 mg total) by mouth 2 (two) times daily with a meal. 02/04/19   Linus MakoBurky, Natalie B, NP  metoprolol tartrate (LOPRESSOR) 25 MG tablet Take 1 tablet (25 mg total) by mouth 2 (two) times daily. 02/04/19   Georgetta HaberBurky, Natalie B, NP  Multiple Vitamin (MULTIVITAMIN) tablet Take 1 tablet by mouth daily. One-A-Day    [provider]  naproxen (NAPROSYN) 500 MG tablet Take 1 tablet (500 mg total) by mouth 2 (two) times daily with a meal. 11/04/17   Joni ReiningSmith, Ronald K, PA-C  ondansetron (ZOFRAN) 4 MG tablet Take 1 tablet (4 mg total) by mouth daily as needed. Patient not taking:  Reported on 04/13/2017 08/27/16   Myrna BlazerSchaevitz, David Matthew, MD  phenazopyridine (PYRIDIUM) 200 MG tablet Take 1 tablet (200 mg total) by mouth 3 (three) times daily as needed for pain. Patient not taking: Reported on 04/13/2017 02/04/16   Menshew, Charlesetta IvoryJenise V Bacon, PA-C  potassium chloride (K-DUR) 10 MEQ tablet Take 1 tablet (10 mEq total) by mouth 2 (two) times daily for 7 days. 04/17/18 04/24/18  Curatolo, Adam, DO  predniSONE (STERAPRED UNI-PAK 21 TAB) 10 MG (21) TBPK tablet Take 1 tablet (10 mg total) by mouth daily. Take as directed on packaging Patient not taking: Reported on 04/13/2017 08/13/16   Phineas SemenGoodman, Graydon, MD    Family History Family History  Problem Relation Age of Onset  . Hypertension Mother   . Anesthesia problems Neg Hx     Social History Social History   Tobacco Use  . Smoking status: Never Smoker  . Smokeless tobacco: Never Used  Substance Use Topics  . Alcohol use: No    Alcohol/week: 6.0 standard drinks    Types: 6 Shots of liquor per week  . Drug use: No    Types: Marijuana     Allergies   Ibuprofen   Review of Systems Review of Systems  Constitutional: Negative for chills and fever.  HENT: Negative for congestion, ear discharge, ear pain, sinus pressure, sinus pain and sore throat.   Eyes: Negative for pain and redness.  Respiratory: Positive for shortness of breath. Negative for cough.   Cardiovascular: Positive for chest pain and leg swelling.  Gastrointestinal: Negative for abdominal pain, constipation, diarrhea, nausea and vomiting.  Genitourinary: Negative for dysuria and hematuria.  Musculoskeletal: Negative for back pain and neck pain.  Skin: Negative for wound.  Allergic/Immunologic: Positive for immunocompromised state (diabetic).  Neurological: Negative for weakness, numbness and headaches.     Physical Exam Updated Vital Signs BP (!) 151/116   Pulse (!) 114   Temp 98.8 F (37.1 C) (Oral)   Resp 18   LMP 02/22/2019 (Approximate)    SpO2 100%   Physical Exam Vitals signs and nursing note reviewed.  Constitutional:      General: She is not in acute distress.    Appearance: She is not ill-appearing.  HENT:     Head: Normocephalic and atraumatic.     Right Ear: Tympanic membrane and external ear normal.     Left Ear: Tympanic membrane and external ear normal.     Nose: Nose normal.     Mouth/Throat:     Mouth: Mucous membranes are dry.     Pharynx: Oropharynx is clear.  Eyes:     General: No scleral icterus.  Right eye: No discharge.        Left eye: No discharge.     Extraocular Movements: Extraocular movements intact.     Conjunctiva/sclera: Conjunctivae normal.     Pupils: Pupils are equal, round, and reactive to light.  Neck:     Musculoskeletal: Normal range of motion.     Vascular: No JVD.  Cardiovascular:     Rate and Rhythm: Regular rhythm. Tachycardia present.     Pulses:          Radial pulses are 2+ on the right side and 2+ on the left side.       Dorsalis pedis pulses are 1+ on the right side and 1+ on the left side.     Heart sounds: Normal heart sounds.  Pulmonary:     Comments: Lungs clear to auscultation in all fields. Symmetric chest rise. No wheezing, rales, or rhonchi. Abdominal:     Comments: Abdomen is soft, non-distended, and non-tender in all quadrants. No rigidity, no guarding. No peritoneal signs.  Musculoskeletal: Normal range of motion.     Right hip: Normal.     Right knee: She exhibits normal range of motion, no swelling, no effusion, no ecchymosis, no deformity, no erythema, no LCL laxity, normal patellar mobility and no MCL laxity. Tenderness found.     Right ankle: Normal.     Right lower leg: 1+ Pitting Edema present.     Left lower leg: 1+ Pitting Edema present.  Skin:    General: Skin is warm and dry.     Capillary Refill: Capillary refill takes less than 2 seconds.  Neurological:     Mental Status: She is oriented to person, place, and time.     GCS: GCS eye  subscore is 4. GCS verbal subscore is 5. GCS motor subscore is 6.     Comments: Fluent speech, no facial droop.  Psychiatric:        Behavior: Behavior normal.      ED Treatments / Results  Labs (all labs ordered are listed, but only abnormal results are displayed) Labs Reviewed  BASIC METABOLIC PANEL - Abnormal; Notable for the following components:      Result Value   Sodium 132 (*)    Glucose, Bld 530 (*)    BUN <5 (*)    Calcium 8.4 (*)    All other components within normal limits  BRAIN NATRIURETIC PEPTIDE - Abnormal; Notable for the following components:   B Natriuretic Peptide 962.3 (*)    All other components within normal limits  URINALYSIS, ROUTINE W REFLEX MICROSCOPIC - Abnormal; Notable for the following components:   APPearance HAZY (*)    Specific Gravity, Urine 1.036 (*)    Glucose, UA >=500 (*)    Hgb urine dipstick SMALL (*)    Protein, ur >=300 (*)    Bacteria, UA MANY (*)    All other components within normal limits  CBC  I-STAT BETA HCG BLOOD, ED ([facility], [facility], [facility] ONLY)  TROPONIN I (HIGH SENSITIVITY)  TROPONIN I (HIGH SENSITIVITY)    EKG EKG Interpretation  Date/Time:  Tuesday March 22 2019 16:59:58 EDT Ventricular Rate:  122 PR Interval:  150 QRS Duration: 84 QT Interval:  320 QTC Calculation: 456 R Axis:   -32 Text Interpretation:  Sinus tachycardia with occasional ventricular-paced complexes Possible Left atrial enlargement Left axis deviation Possible Anterior infarct , age undetermined Abnormal ECG No significant change since last tracing Confirmed by Jacalyn Lefevre [phone]) on 03/22/2019 9:53:38  PM   Radiology Dg Chest 2 View  Result Date: 03/22/2019 CLINICAL DATA:  33 year old female with history of shortness of breath with central and left-sided chest pain. EXAM: CHEST - 2 VIEW COMPARISON:  Chest x-ray 02/04/2019. FINDINGS: Lung volumes are normal. No consolidative airspace disease. No pleural effusions. No pneumothorax. No pulmonary nodule or  mass noted. Pulmonary vasculature and the cardiomediastinal silhouette are within normal limits. IMPRESSION: No radiographic evidence of acute cardiopulmonary disease. Electronically Signed   By: Vinnie Langton M.D.   On: 03/22/2019 17:34   Ct Angio Chest Pe W And/or Wo Contrast  Result Date: 03/22/2019 CLINICAL DATA:  Chest pain, ankle swelling EXAM: CT ANGIOGRAPHY CHEST WITH CONTRAST TECHNIQUE: Multidetector CT imaging of the chest was performed using the standard protocol during bolus administration of intravenous contrast. Multiplanar CT image reconstructions and MIPs were obtained to evaluate the vascular anatomy. CONTRAST:  85mL OMNIPAQUE IOHEXOL 350 MG/ML SOLN COMPARISON:  Chest x-ray earlier today FINDINGS: Cardiovascular: No filling defects in the pulmonary arteries to suggestpulmonary emboli. Cardiomegaly. No evidence of aortic aneurysm. Mediastinum/Nodes: No mediastinal, hilar, or axillary adenopathy. Trachea and esophagus are unremarkable. Thyroid unremarkable. Lungs/Pleura: Lungs are clear. No focal airspace opacities or suspicious nodules. No effusions. Upper Abdomen: Imaging into the upper abdomen shows no acute findings. Musculoskeletal: Chest wall soft tissues are unremarkable. No acute bony abnormality. Review of the MIP images confirms the above findings. IMPRESSION: No evidence of pulmonary embolus. Cardiomegaly.  No acute cardiopulmonary disease. Electronically Signed   By: Rolm Baptise M.D.   On: 03/22/2019 23:12   Dg Knee Complete 4 Views Right  Result Date: 03/22/2019 CLINICAL DATA:  Knee pain and swelling, no known injury, initial encounter EXAM: RIGHT KNEE - COMPLETE 4+ VIEW COMPARISON:  None. FINDINGS: Generalized soft tissue swelling is noted. No acute fracture or dislocation is seen. No joint effusion is seen. No other focal abnormality is noted. IMPRESSION: Soft tissue swelling without acute bony abnormality. Electronically Signed   By: Inez Catalina M.D.   On: 03/22/2019 23:18     Procedures Procedures (including critical care time)  Medications Ordered in ED Medications  sodium chloride flush (NS) 0.9 % injection 3 mL (3 mLs Intravenous Given 03/22/19 2212)  acetaminophen (TYLENOL) tablet 650 mg (650 mg Oral Given 03/22/19 2230)  iohexol (OMNIPAQUE) 350 MG/ML injection 80 mL (80 mLs Intravenous Contrast Given 03/22/19 2243)  insulin aspart (novoLOG) injection 10 Units (10 Units Intravenous Given 03/22/19 2355)  sodium chloride 0.9 % bolus 250 mL (250 mLs Intravenous New Bag/Given 03/22/19 2356)     Initial Impression / Assessment and Plan / ED Course  I have reviewed the triage vital signs and the nursing notes.  Pertinent labs & imaging results that were available during my care of the patient were reviewed by me and considered in my medical decision making (see chart for details).  On arrival pt is hypertensive and tachycardic at 164/124 and 125. On exam lungs are clear to auscultation in all fields and is 100% on room air. She has bilateral 1+ pitting edema. Her labs are significant for hyperglycemia of 530, no elevated anion gap, doubt DKA. Given signs of fluid rentention, I am hesitant to give fluid bolus for hyperglycemia and tachycardia. Will give IV insulin and small fluid bolus. BNP elevated to 962.3. Troponins are 12 and 15, doubt ACS. EKG without ischemic changes. Chest xray viewed by me without signs of edema or infiltrate. I ambulated pt around the room and SpO2 dropped to 93% on  room air and she became short of breath. CT chest is negative for PE. After insulin repeat glucose is 460. Findings and plan of care discussed with supervising physician Dr. Particia Nearing. Spoke with Dr. Julian Reil with hospitalist service who agrees to assume care of patient and bring into the hospital for further evaluation and management.     Vitals:   03/22/19 2215 03/22/19 2230 03/22/19 2330 03/23/19 0046  BP: (!) 151/116  (!) 156/117   Pulse:  (!) 114 (!) 116   Resp:      Temp:       TempSrc:      SpO2:  100% 100%   Weight:    86.2 kg  Height:    5' (1.524 m)    This note was prepared using Conservation officer, historic buildings and may include unintentional dictation errors due to the inherent limitations of voice recognition software.     Final Clinical Impressions(s) / ED Diagnoses   Final diagnoses:  Hyperglycemia  Peripheral edema    ED Discharge Orders    None       Sherene Sires, PA-C 03/23/19 0125    , [place], PA-C 03/23/19 0157    Jacalyn Lefevre, MD 03/23/19 1520

## 2019-03-22 NOTE — ED Triage Notes (Signed)
Pt arrives to ED with bilateral leg swelling for over the last week, pt does have bilateral pitting edema in lower legs from knees into feet. Pt states she has sob with laying flat also has been needing two pillows at night and sometimes still wakes up gasping for air.

## 2019-03-23 ENCOUNTER — Encounter (HOSPITAL_COMMUNITY): Payer: Self-pay | Admitting: *Deleted

## 2019-03-23 ENCOUNTER — Observation Stay (HOSPITAL_BASED_OUTPATIENT_CLINIC_OR_DEPARTMENT_OTHER): Payer: Medicaid Other

## 2019-03-23 ENCOUNTER — Other Ambulatory Visit: Payer: Self-pay

## 2019-03-23 DIAGNOSIS — I110 Hypertensive heart disease with heart failure: Secondary | ICD-10-CM | POA: Diagnosis not present

## 2019-03-23 DIAGNOSIS — Z20828 Contact with and (suspected) exposure to other viral communicable diseases: Secondary | ICD-10-CM | POA: Diagnosis not present

## 2019-03-23 DIAGNOSIS — R609 Edema, unspecified: Secondary | ICD-10-CM | POA: Diagnosis not present

## 2019-03-23 DIAGNOSIS — I5041 Acute combined systolic (congestive) and diastolic (congestive) heart failure: Secondary | ICD-10-CM | POA: Diagnosis present

## 2019-03-23 DIAGNOSIS — M25561 Pain in right knee: Secondary | ICD-10-CM | POA: Diagnosis not present

## 2019-03-23 DIAGNOSIS — I10 Essential (primary) hypertension: Secondary | ICD-10-CM | POA: Diagnosis not present

## 2019-03-23 DIAGNOSIS — R809 Proteinuria, unspecified: Secondary | ICD-10-CM | POA: Diagnosis present

## 2019-03-23 DIAGNOSIS — F1490 Cocaine use, unspecified, uncomplicated: Secondary | ICD-10-CM | POA: Diagnosis present

## 2019-03-23 DIAGNOSIS — R600 Localized edema: Secondary | ICD-10-CM | POA: Diagnosis not present

## 2019-03-23 DIAGNOSIS — I2510 Atherosclerotic heart disease of native coronary artery without angina pectoris: Secondary | ICD-10-CM | POA: Diagnosis not present

## 2019-03-23 DIAGNOSIS — I517 Cardiomegaly: Secondary | ICD-10-CM | POA: Diagnosis present

## 2019-03-23 DIAGNOSIS — I160 Hypertensive urgency: Secondary | ICD-10-CM | POA: Diagnosis present

## 2019-03-23 DIAGNOSIS — Z79899 Other long term (current) drug therapy: Secondary | ICD-10-CM | POA: Diagnosis not present

## 2019-03-23 DIAGNOSIS — E119 Type 2 diabetes mellitus without complications: Secondary | ICD-10-CM | POA: Diagnosis present

## 2019-03-23 DIAGNOSIS — E1165 Type 2 diabetes mellitus with hyperglycemia: Secondary | ICD-10-CM | POA: Diagnosis not present

## 2019-03-23 DIAGNOSIS — I428 Other cardiomyopathies: Secondary | ICD-10-CM | POA: Diagnosis not present

## 2019-03-23 DIAGNOSIS — R079 Chest pain, unspecified: Secondary | ICD-10-CM | POA: Diagnosis not present

## 2019-03-23 DIAGNOSIS — G43909 Migraine, unspecified, not intractable, without status migrainosus: Secondary | ICD-10-CM | POA: Diagnosis not present

## 2019-03-23 DIAGNOSIS — I5031 Acute diastolic (congestive) heart failure: Secondary | ICD-10-CM

## 2019-03-23 DIAGNOSIS — Z8249 Family history of ischemic heart disease and other diseases of the circulatory system: Secondary | ICD-10-CM | POA: Diagnosis not present

## 2019-03-23 DIAGNOSIS — I959 Hypotension, unspecified: Secondary | ICD-10-CM | POA: Diagnosis not present

## 2019-03-23 DIAGNOSIS — R739 Hyperglycemia, unspecified: Secondary | ICD-10-CM

## 2019-03-23 DIAGNOSIS — I5043 Acute on chronic combined systolic (congestive) and diastolic (congestive) heart failure: Secondary | ICD-10-CM | POA: Diagnosis present

## 2019-03-23 DIAGNOSIS — Z7984 Long term (current) use of oral hypoglycemic drugs: Secondary | ICD-10-CM | POA: Diagnosis not present

## 2019-03-23 DIAGNOSIS — Z888 Allergy status to other drugs, medicaments and biological substances status: Secondary | ICD-10-CM | POA: Diagnosis not present

## 2019-03-23 DIAGNOSIS — F1590 Other stimulant use, unspecified, uncomplicated: Secondary | ICD-10-CM | POA: Diagnosis present

## 2019-03-23 DIAGNOSIS — R0602 Shortness of breath: Secondary | ICD-10-CM | POA: Diagnosis not present

## 2019-03-23 DIAGNOSIS — W109XXA Fall (on) (from) unspecified stairs and steps, initial encounter: Secondary | ICD-10-CM | POA: Diagnosis present

## 2019-03-23 DIAGNOSIS — R000 Tachycardia, unspecified: Secondary | ICD-10-CM | POA: Diagnosis not present

## 2019-03-23 DIAGNOSIS — M7989 Other specified soft tissue disorders: Secondary | ICD-10-CM | POA: Diagnosis not present

## 2019-03-23 DIAGNOSIS — E8809 Other disorders of plasma-protein metabolism, not elsewhere classified: Secondary | ICD-10-CM | POA: Diagnosis present

## 2019-03-23 DIAGNOSIS — E876 Hypokalemia: Secondary | ICD-10-CM | POA: Diagnosis not present

## 2019-03-23 LAB — HEPATIC FUNCTION PANEL
ALT: 19 U/L (ref 0–44)
AST: 41 U/L (ref 15–41)
Albumin: 2.8 g/dL — ABNORMAL LOW (ref 3.5–5.0)
Alkaline Phosphatase: 122 U/L (ref 38–126)
Bilirubin, Direct: 0.4 mg/dL — ABNORMAL HIGH (ref 0.0–0.2)
Indirect Bilirubin: 0.4 mg/dL (ref 0.3–0.9)
Total Bilirubin: 0.8 mg/dL (ref 0.3–1.2)
Total Protein: 6 g/dL — ABNORMAL LOW (ref 6.5–8.1)

## 2019-03-23 LAB — BASIC METABOLIC PANEL
Anion gap: 11 (ref 5–15)
Anion gap: 11 (ref 5–15)
BUN: <5 mg/dL — ABNORMAL LOW (ref 6–20)
BUN: 6 mg/dL (ref 6–20)
CO2: 24 mmol/L (ref 22–32)
CO2: 29 mmol/L (ref 22–32)
Calcium: 8.3 mg/dL — ABNORMAL LOW (ref 8.9–10.3)
Calcium: 8.6 mg/dL — ABNORMAL LOW (ref 8.9–10.3)
Chloride: 100 mmol/L (ref 98–111)
Chloride: 100 mmol/L (ref 98–111)
Creatinine, Ser: 0.69 mg/dL (ref 0.44–1.00)
Creatinine, Ser: 0.65 mg/dL (ref 0.44–1.00)
GFR calc Af Amer: >60 mL/min (ref >60)
GFR calc Af Amer: >60 mL/min (ref >60)
GFR calc non Af Amer: >60 mL/min (ref >60)
GFR calc non Af Amer: >60 mL/min (ref >60)
Glucose, Bld: 439 mg/dL — ABNORMAL HIGH (ref 70–99)
Glucose, Bld: 93 mg/dL (ref 70–99)
Potassium: 3.5 mmol/L (ref 3.5–5.1)
Potassium: 3.3 mmol/L — ABNORMAL LOW (ref 3.5–5.1)
Sodium: 135 mmol/L (ref 135–145)
Sodium: 140 mmol/L (ref 135–145)

## 2019-03-23 LAB — GLUCOSE, CAPILLARY
Glucose-Capillary: 184 mg/dL — ABNORMAL HIGH (ref 70–99)
Glucose-Capillary: 162 mg/dL — ABNORMAL HIGH (ref 70–99)
Glucose-Capillary: 163 mg/dL — ABNORMAL HIGH (ref 70–99)
Glucose-Capillary: 140 mg/dL — ABNORMAL HIGH (ref 70–99)
Glucose-Capillary: 102 mg/dL — ABNORMAL HIGH (ref 70–99)
Glucose-Capillary: 100 mg/dL — ABNORMAL HIGH (ref 70–99)
Glucose-Capillary: 361 mg/dL — ABNORMAL HIGH (ref 70–99)
Glucose-Capillary: 256 mg/dL — ABNORMAL HIGH (ref 70–99)
Glucose-Capillary: 174 mg/dL — ABNORMAL HIGH (ref 70–99)

## 2019-03-23 LAB — ECHOCARDIOGRAM COMPLETE
Height: 61 in
Weight: 2567.92 oz

## 2019-03-23 LAB — HEMOGLOBIN A1C
Hgb A1c MFr Bld: 13.9 % — ABNORMAL HIGH (ref 4.8–5.6)
Mean Plasma Glucose: 352.23 mg/dL

## 2019-03-23 LAB — SEDIMENTATION RATE: Sed Rate: 35 mm/hr — ABNORMAL HIGH (ref 0–22)

## 2019-03-23 LAB — RAPID URINE DRUG SCREEN, HOSP PERFORMED
Amphetamines: POSITIVE — AB
Barbiturates: NOT DETECTED
Benzodiazepines: NOT DETECTED
Cocaine: NOT DETECTED
Opiates: NOT DETECTED
Tetrahydrocannabinol: NOT DETECTED

## 2019-03-23 LAB — CBG MONITORING, ED
Glucose-Capillary: 232 mg/dL — ABNORMAL HIGH (ref 70–99)
Glucose-Capillary: 466 mg/dL — ABNORMAL HIGH (ref 70–99)

## 2019-03-23 LAB — HIV ANTIBODY (ROUTINE TESTING W REFLEX): HIV Screen 4th Generation wRfx: NONREACTIVE

## 2019-03-23 LAB — MRSA PCR SCREENING: MRSA by PCR: NEGATIVE

## 2019-03-23 LAB — C-REACTIVE PROTEIN: CRP: 0.9 mg/dL (ref <1.0)

## 2019-03-23 LAB — SARS CORONAVIRUS 2 (TAT 6-24 HRS): SARS Coronavirus 2: NEGATIVE

## 2019-03-23 MED ORDER — INSULIN GLARGINE 100 UNIT/ML ~~LOC~~ SOLN
12.0000 [IU] | Freq: Every day | SUBCUTANEOUS | Status: DC
  Administered 2019-03-23 – 2019-03-24 (×2): 12 [IU] via SUBCUTANEOUS
  Filled 2019-03-23 (×2): qty 0.12

## 2019-03-23 MED ORDER — FUROSEMIDE 10 MG/ML IJ SOLN
40.0000 mg | Freq: Once | INTRAMUSCULAR | Status: AC
  Administered 2019-03-23: 03:00:00 40 mg via INTRAVENOUS
  Filled 2019-03-23: qty 4

## 2019-03-23 MED ORDER — ENOXAPARIN SODIUM 40 MG/0.4ML ~~LOC~~ SOLN
40.0000 mg | SUBCUTANEOUS | Status: DC
  Administered 2019-03-23 – 2019-03-24 (×2): 40 mg via SUBCUTANEOUS
  Filled 2019-03-23 (×2): qty 0.4

## 2019-03-23 MED ORDER — INSULIN REGULAR BOLUS VIA INFUSION
0.0000 [IU] | Freq: Three times a day (TID) | INTRAVENOUS | Status: DC
  Filled 2019-03-23: qty 10

## 2019-03-23 MED ORDER — LIVING WELL WITH DIABETES BOOK
Freq: Once | Status: AC
  Administered 2019-03-23: 12:00:00
  Filled 2019-03-23: qty 1

## 2019-03-23 MED ORDER — SODIUM CHLORIDE 0.9 % IV SOLN
INTRAVENOUS | Status: DC
  Administered 2019-03-23: 01:00:00 via INTRAVENOUS

## 2019-03-23 MED ORDER — SPIRONOLACTONE 12.5 MG HALF TABLET
12.5000 mg | ORAL_TABLET | Freq: Every day | ORAL | Status: DC
  Administered 2019-03-23 – 2019-03-24 (×2): 12.5 mg via ORAL
  Filled 2019-03-23 (×2): qty 1

## 2019-03-23 MED ORDER — DIGOXIN 125 MCG PO TABS
0.1250 mg | ORAL_TABLET | Freq: Every day | ORAL | Status: DC
  Administered 2019-03-23 – 2019-03-24 (×2): 0.125 mg via ORAL
  Filled 2019-03-23 (×2): qty 1

## 2019-03-23 MED ORDER — INSULIN STARTER KIT- PEN NEEDLES (ENGLISH)
1.0000 | Freq: Once | Status: AC
  Administered 2019-03-23: 17:00:00 1
  Filled 2019-03-23: qty 1

## 2019-03-23 MED ORDER — ONDANSETRON HCL 4 MG/2ML IJ SOLN: 4.0000 mg | Freq: Four times a day (QID) | INTRAMUSCULAR | Status: DC | PRN

## 2019-03-23 MED ORDER — POTASSIUM CHLORIDE CRYS ER 20 MEQ PO TBCR
20.0000 meq | EXTENDED_RELEASE_TABLET | Freq: Two times a day (BID) | ORAL | Status: DC
  Administered 2019-03-23 – 2019-03-24 (×3): 20 meq via ORAL
  Filled 2019-03-23 (×3): qty 1

## 2019-03-23 MED ORDER — ACETAMINOPHEN 325 MG PO TABS
650.0000 mg | ORAL_TABLET | Freq: Four times a day (QID) | ORAL | Status: DC | PRN
  Administered 2019-03-23 – 2019-03-24 (×4): 650 mg via ORAL
  Filled 2019-03-23 (×4): qty 2

## 2019-03-23 MED ORDER — DEXTROSE-NACL 5-0.45 % IV SOLN: INTRAVENOUS | Status: DC

## 2019-03-23 MED ORDER — SACUBITRIL-VALSARTAN 24-26 MG PO TABS
1.0000 | ORAL_TABLET | Freq: Two times a day (BID) | ORAL | Status: DC
  Administered 2019-03-24 (×2): 1 via ORAL
  Filled 2019-03-23 (×2): qty 1

## 2019-03-23 MED ORDER — DEXTROSE 50 % IV SOLN: 25.0000 mL | INTRAVENOUS | Status: DC | PRN

## 2019-03-23 MED ORDER — FUROSEMIDE 10 MG/ML IJ SOLN
40.0000 mg | Freq: Two times a day (BID) | INTRAMUSCULAR | Status: DC
  Administered 2019-03-23 – 2019-03-24 (×3): 40 mg via INTRAVENOUS
  Filled 2019-03-23 (×3): qty 4

## 2019-03-23 MED ORDER — ACETAMINOPHEN 650 MG RE SUPP: 650.0000 mg | Freq: Four times a day (QID) | RECTAL | Status: DC | PRN

## 2019-03-23 MED ORDER — ONDANSETRON HCL 4 MG PO TABS: 4.0000 mg | ORAL_TABLET | Freq: Four times a day (QID) | ORAL | Status: DC | PRN

## 2019-03-23 MED ORDER — INSULIN REGULAR(HUMAN) IN NACL 100-0.9 UT/100ML-% IV SOLN
INTRAVENOUS | Status: DC
  Administered 2019-03-23: 4.1 [IU]/h via INTRAVENOUS
  Filled 2019-03-23: qty 100

## 2019-03-23 MED ORDER — INSULIN ASPART 100 UNIT/ML ~~LOC~~ SOLN
0.0000 [IU] | Freq: Three times a day (TID) | SUBCUTANEOUS | Status: DC
  Administered 2019-03-23: 12:00:00 15 [IU] via SUBCUTANEOUS
  Administered 2019-03-23: 17:00:00 8 [IU] via SUBCUTANEOUS
  Administered 2019-03-24 (×2): 5 [IU] via SUBCUTANEOUS
  Administered 2019-03-25: 15 [IU] via SUBCUTANEOUS

## 2019-03-23 MED ORDER — METOPROLOL TARTRATE 12.5 MG HALF TABLET
12.5000 mg | ORAL_TABLET | Freq: Two times a day (BID) | ORAL | Status: DC
  Administered 2019-03-23 (×2): 12.5 mg via ORAL
  Filled 2019-03-23 (×2): qty 1

## 2019-03-23 MED ORDER — INSULIN ASPART 100 UNIT/ML ~~LOC~~ SOLN
4.0000 [IU] | Freq: Three times a day (TID) | SUBCUTANEOUS | Status: DC
  Administered 2019-03-23 – 2019-03-26 (×5): 4 [IU] via SUBCUTANEOUS

## 2019-03-23 MED ORDER — INSULIN ASPART 100 UNIT/ML ~~LOC~~ SOLN: 0.0000 [IU] | Freq: Three times a day (TID) | SUBCUTANEOUS | Status: DC

## 2019-03-23 MED ORDER — SACUBITRIL-VALSARTAN 24-26 MG PO TABS: 1.0000 | ORAL_TABLET | Freq: Two times a day (BID) | ORAL | Status: DC

## 2019-03-23 MED ORDER — CYCLOBENZAPRINE HCL 10 MG PO TABS
5.0000 mg | ORAL_TABLET | Freq: Once | ORAL | Status: AC
  Administered 2019-03-23: 5 mg via ORAL
  Filled 2019-03-23: qty 1

## 2019-03-23 MED ORDER — HYDRALAZINE HCL 20 MG/ML IJ SOLN
5.0000 mg | Freq: Four times a day (QID) | INTRAMUSCULAR | Status: DC | PRN
  Administered 2019-03-23 – 2019-03-26 (×4): 5 mg via INTRAVENOUS
  Filled 2019-03-23 (×4): qty 1

## 2019-03-23 NOTE — Progress Notes (Signed)
Dr. Teryl Lucy is at patient bedside to discuss echo results.  Cardiology consulted.  Patient is now back on telemetry for closer monitoring.  Expect Heart Failure team to be consulted.

## 2019-03-23 NOTE — ED Notes (Signed)
ED TO INPATIENT HANDOFF REPORT  ED Nurse Name and Phone #: [phone] [name], RN  S Name/Age/Gender Cheryl Mercer 33 y.o. female Room/Bed: 030C/030C  Code Status   Code Status: Full Code  Home/SNF/Other Home Patient oriented to: self, place, time and situation Is this baseline? Yes   Triage Complete: Triage complete  Chief Complaint leg/ankle pain/diabetic  Triage Note Pt arrives to ED with bilateral leg swelling for over the last week, pt does have bilateral pitting edema in lower legs from knees into feet. Pt states she has sob with laying flat also has been needing two pillows at night and sometimes still wakes up gasping for air.    Allergies Allergies  Allergen Reactions  . Ibuprofen Anaphylaxis    Level of Care/Admitting Diagnosis ED Disposition    ED Disposition Condition Comment   Admit  Hospital Area: [facility] [[id number]]  Level of Care: Progressive [102]  I expect the patient will be discharged within 24 hours: No (not a candidate for 5C-Observation unit)  Covid Evaluation: Asymptomatic Screening Protocol (No Symptoms)  Diagnosis: Hyperglycemia [625638]  Admitting Physician: Wyvonnia Dusky  Attending Physician: Hillary Bow [[id number]  PT Class (Do Not Modify): Observation [104]  PT Acc Code (Do Not Modify): Observation [10022]       B Medical/Surgery History Past Medical History:  Diagnosis Date  . Chronic back pain   . Diabetes mellitus without complication (HCC)   . Migraines   . No pertinent past medical history   . Pregnancy induced hypertension    Past Surgical History:  Procedure Laterality Date  . CERVICAL BIOPSY  W/ LOOP ELECTRODE EXCISION  2009  . Laparoscopic tubal sterilization with Falope rings.  2009  . TUBAL LIGATION       A IV Location/Drains/Wounds Patient Lines/Drains/Airways Status   Active Line/Drains/Airways    Name:   Placement date:   Placement time:   Site:   Days:   Peripheral IV  03/22/19 Right Antecubital   03/22/19    2213    Antecubital   1          Intake/Output Last 24 hours No intake or output data in the 24 hours ending 03/23/19 0200  Labs/Imaging Results for orders placed or performed during the hospital encounter of 03/22/19 (from the past 48 hour(s))  Basic metabolic panel     Status: Abnormal   Collection Time: 03/22/19  4:59 PM  Result Value Ref Range   Sodium 132 (L) 135 - 145 mmol/L   Potassium 3.6 3.5 - 5.1 mmol/L   Chloride 98 98 - 111 mmol/L   CO2 22 22 - 32 mmol/L   Glucose, Bld 530 (HH) 70 - 99 mg/dL    Comment: CRITICAL RESULT CALLED TO, READ BACK BY AND VERIFIED WITH: J EASLEY,RN 1809 03/22/2019 D BRADLEY    BUN <5 (L) 6 - 20 mg/dL   Creatinine, Ser 9.37 0.44 - 1.00 mg/dL   Calcium 8.4 (L) 8.9 - 10.3 mg/dL   GFR calc non Af Amer >60 >60 mL/min   GFR calc Af Amer >60 >60 mL/min   Anion gap 12 5 - 15    Comment: Performed at [facility], [address] [place], [place] [address]  CBC     Status: None   Collection Time: 03/22/19  4:59 PM  Result Value Ref Range   WBC 6.9 4.0 - 10.5 K/uL   RBC 4.39 3.87 - 5.11 MIL/uL   Hemoglobin 12.8  12.0 - 15.0 g/dL   HCT 69.6 29.5 - 28.4 %   MCV 90.4 80.0 - 100.0 fL   MCH 29.2 26.0 - 34.0 pg   MCHC 32.2 30.0 - 36.0 g/dL   RDW 13.2 44.0 - 10.2 %   Platelets 292 150 - 400 K/uL   nRBC 0.0 0.0 - 0.2 %    Comment: Performed at [facility], [address], [place], [place] [address]  Troponin I (High Sensitivity)     Status: None   Collection Time: 03/22/19  4:59 PM  Result Value Ref Range   Troponin I (High Sensitivity) 12 <18 ng/L    Comment: (NOTE) Elevated high sensitivity troponin I (hsTnI) values and significant  changes across serial measurements may suggest ACS but many other  chronic and acute conditions are known to elevate hsTnI results.  Refer to the Links section for chest pain algorithms and additional  guidance. Performed at [facility], [address], [place], [place] [address]   I-Stat beta hCG blood, ED     Status: None   Collection Time: 03/22/19  5:35 PM  Result Value Ref Range   I-stat hCG, quantitative <5.0 <5 mIU/mL   Comment 3            Comment:   GEST. AGE      CONC.  (mIU/mL)   <=1 WEEK        5 - 50     2 WEEKS       50 - 500     3 WEEKS       100 - 10,000     4 WEEKS     1,000 - 30,000        FEMALE AND NON-PREGNANT FEMALE:     LESS THAN 5 mIU/mL   Troponin I (High Sensitivity)     Status: None   Collection Time: 03/22/19  9:39 PM  Result Value Ref Range   Troponin I (High Sensitivity) 15 <18 ng/L    Comment: (NOTE) Elevated high sensitivity troponin I (hsTnI) values and significant  changes across serial measurements may suggest ACS but many other  chronic and acute conditions are known to elevate hsTnI results.  Refer to the "Links" section for chest pain algorithms and additional  guidance. Performed at [facility], [address], [place], [place] [address]   Brain natriuretic peptide     Status: Abnormal   Collection Time: 03/22/19  9:54 PM  Result Value Ref Range   B Natriuretic Peptide 962.3 (H) 0.0 - 100.0 pg/mL    Comment: Performed at [facility], [address] [place], [place] [address]  Urinalysis, Routine w reflex microscopic     Status: Abnormal   Collection Time: 03/22/19 11:07 PM  Result Value Ref Range   Color, Urine YELLOW YELLOW   APPearance HAZY (A) CLEAR   Specific Gravity, Urine 1.036 (H) 1.005 - 1.030   pH 5.0 5.0 - 8.0   Glucose, UA >=500 (A) NEGATIVE mg/dL   Hgb urine dipstick SMALL (A) NEGATIVE   Bilirubin Urine NEGATIVE NEGATIVE   Ketones, ur NEGATIVE NEGATIVE mg/dL   Protein, ur >=756 (A) NEGATIVE mg/dL   Nitrite NEGATIVE NEGATIVE   Leukocytes,Ua NEGATIVE NEGATIVE   RBC / HPF 0-5 0 - 5 RBC/hpf   WBC, UA 0-5 0 - 5 WBC/hpf   Bacteria, UA MANY (A) NONE SEEN   Squamous Epithelial / LPF 0-5 0 - 5  Comment: Performed at [facility], [place] [address],  [place], [place] [address]  Urine rapid drug screen (hosp performed)     Status: Abnormal   Collection Time: 03/23/19 12:18 AM  Result Value Ref Range   Opiates NONE DETECTED NONE DETECTED   Cocaine NONE DETECTED NONE DETECTED   Benzodiazepines NONE DETECTED NONE DETECTED   Amphetamines POSITIVE (A) NONE DETECTED   Tetrahydrocannabinol NONE DETECTED NONE DETECTED   Barbiturates NONE DETECTED NONE DETECTED    Comment: (NOTE) DRUG SCREEN FOR MEDICAL PURPOSES ONLY.  IF CONFIRMATION IS NEEDED FOR ANY PURPOSE, NOTIFY LAB WITHIN 5 DAYS. LOWEST DETECTABLE LIMITS FOR URINE DRUG SCREEN Drug Class                     Cutoff (ng/mL) Amphetamine and metabolites    1000 Barbiturate and metabolites    200 Benzodiazepine                 993 Tricyclics and metabolites     300 Opiates and metabolites        300 Cocaine and metabolites        300 THC                            50 Performed at [facility], [place] [address] [place], [place] [address]   POC CBG, ED     Status: Abnormal   Collection Time: 03/23/19 12:36 AM  Result Value Ref Range   Glucose-Capillary 466 (H) 70 - 99 mg/dL  Hepatic function panel     Status: Abnormal   Collection Time: 03/23/19 12:53 AM  Result Value Ref Range   Total Protein 6.0 (L) 6.5 - 8.1 g/dL   Albumin 2.8 (L) 3.5 - 5.0 g/dL   AST 41 15 - 41 U/L   ALT 19 0 - 44 U/L   Alkaline Phosphatase 122 38 - 126 U/L   Total Bilirubin 0.8 0.3 - 1.2 mg/dL   Bilirubin, Direct 0.4 (H) 0.0 - 0.2 mg/dL   Indirect Bilirubin 0.4 0.3 - 0.9 mg/dL    Comment: Performed at [facility], [place] [address], [place], [place] [address]  Basic metabolic panel     Status: Abnormal   Collection Time: 03/23/19 12:58 AM  Result Value Ref Range   Sodium 135 135 - 145 mmol/L   Potassium 3.5 3.5 - 5.1 mmol/L   Chloride 100 98 - 111 mmol/L   CO2 24 22 - 32 mmol/L   Glucose, Bld 439 (H) 70 - 99 mg/dL   BUN <5 (L) 6 - 20 mg/dL   Creatinine, Ser 0.69 0.44 - 1.00 mg/dL   Calcium 8.3  (L) 8.9 - 10.3 mg/dL   GFR calc non Af Amer >60 >60 mL/min   GFR calc Af Amer >60 >60 mL/min   Anion gap 11 5 - 15    Comment: Performed at [facility], [place] [address], [place], [place] [address]   Dg Chest 2 View  Result Date: 03/22/2019 CLINICAL DATA:  33 year old female with history of shortness of breath with central and left-sided chest pain. EXAM: CHEST - 2 VIEW COMPARISON:  Chest x-ray 02/04/2019. FINDINGS: Lung volumes are normal. No consolidative airspace disease. No pleural effusions. No pneumothorax. No pulmonary nodule or mass noted. Pulmonary vasculature and the cardiomediastinal silhouette are within normal limits. IMPRESSION: No radiographic evidence of acute cardiopulmonary disease. Electronically Signed   By:  Trudie Reedaniel  Entrikin M.D.   On: 03/22/2019 17:34   Ct Angio Chest Pe W And/or Wo Contrast  Result Date: 03/22/2019 CLINICAL DATA:  Chest pain, ankle swelling EXAM: CT ANGIOGRAPHY CHEST WITH CONTRAST TECHNIQUE: Multidetector CT imaging of the chest was performed using the standard protocol during bolus administration of intravenous contrast. Multiplanar CT image reconstructions and MIPs were obtained to evaluate the vascular anatomy. CONTRAST:  80mL OMNIPAQUE IOHEXOL 350 MG/ML SOLN COMPARISON:  Chest x-ray earlier today FINDINGS: Cardiovascular: No filling defects in the pulmonary arteries to suggestpulmonary emboli. Cardiomegaly. No evidence of aortic aneurysm. Mediastinum/Nodes: No mediastinal, hilar, or axillary adenopathy. Trachea and esophagus are unremarkable. Thyroid unremarkable. Lungs/Pleura: Lungs are clear. No focal airspace opacities or suspicious nodules. No effusions. Upper Abdomen: Imaging into the upper abdomen shows no acute findings. Musculoskeletal: Chest wall soft tissues are unremarkable. No acute bony abnormality. Review of the MIP images confirms the above findings. IMPRESSION: No evidence of pulmonary embolus. Cardiomegaly.  No acute cardiopulmonary disease.  Electronically Signed   By: Charlett NoseKevin  Dover M.D.   On: 03/22/2019 23:12   Dg Knee Complete 4 Views Right  Result Date: 03/22/2019 CLINICAL DATA:  Knee pain and swelling, no known injury, initial encounter EXAM: RIGHT KNEE - COMPLETE 4+ VIEW COMPARISON:  None. FINDINGS: Generalized soft tissue swelling is noted. No acute fracture or dislocation is seen. No joint effusion is seen. No other focal abnormality is noted. IMPRESSION: Soft tissue swelling without acute bony abnormality. Electronically Signed   By: Alcide CleverMark  Lukens M.D.   On: 03/22/2019 23:18    Pending Labs Unresulted Labs (From admission, onward)    Start     Ordered   03/23/19 0042  HIV antibody (Routine Testing)  Once,   STAT     03/23/19 0043   03/23/19 0015  SARS CORONAVIRUS 2 Nasal Swab Aptima Multi Swab  (Asymptomatic/Tier 2)  Once,   STAT    Question Answer Comment  Is this test for diagnosis or screening Screening   Symptomatic for COVID-19 as defined by CDC No   Hospitalized for COVID-19 No   Admitted to ICU for COVID-19 No   Previously tested for COVID-19 No   Resident in a congregate (group) care setting No   Employed in healthcare setting No   Pregnant No      03/23/19 0014          Vitals/Pain Today's Vitals   03/22/19 2230 03/22/19 2330 03/23/19 0002 03/23/19 0046  BP:  (!) 156/117    Pulse: (!) 114 (!) 116    Resp:      Temp:      TempSrc:      SpO2: 100% 100%    Weight:    86.2 kg  Height:    5' (1.524 m)  PainSc:   0-No pain     Isolation Precautions No active isolations  Medications Medications  metoprolol tartrate (LOPRESSOR) tablet 12.5 mg (12.5 mg Oral Given 03/23/19 0033)  dextrose 5 %-0.45 % sodium chloride infusion ( Intravenous Hold 03/23/19 0058)  insulin regular bolus via infusion 0-10 Units (has no administration in time range)  insulin regular, human (MYXREDLIN) 100 units/ 100 mL infusion (4.1 Units/hr Intravenous New Bag/Given 03/23/19 0056)  dextrose 50 % solution 25 mL (has no  administration in time range)  0.9 %  sodium chloride infusion ( Intravenous New Bag/Given 03/23/19 0053)  acetaminophen (TYLENOL) tablet 650 mg (has no administration in time range)    Or  acetaminophen (TYLENOL) suppository 650 mg (  has no administration in time range)  ondansetron (ZOFRAN) tablet 4 mg (has no administration in time range)    Or  ondansetron (ZOFRAN) injection 4 mg (has no administration in time range)  enoxaparin (LOVENOX) injection 40 mg (has no administration in time range)  furosemide (LASIX) injection 40 mg (has no administration in time range)  sodium chloride flush (NS) 0.9 % injection 3 mL (3 mLs Intravenous Given 03/22/19 2212)  acetaminophen (TYLENOL) tablet 650 mg (650 mg Oral Given 03/22/19 2230)  iohexol (OMNIPAQUE) 350 MG/ML injection 80 mL (80 mLs Intravenous Contrast Given 03/22/19 2243)  insulin aspart (novoLOG) injection 10 Units (10 Units Intravenous Given 03/22/19 2355)  sodium chloride 0.9 % bolus 250 mL (0 mLs Intravenous Stopped 03/23/19 0036)    Mobility walks Low fall risk   Focused Assessments Cardiac Assessment Handoff:    No results found for: CKTOTAL, CKMB, CKMBINDEX, TROPONINI No results found for: DDIMER Does the Patient currently have chest pain? No      R Recommendations: See Admitting Provider Note  Report given to:   Additional Notes:

## 2019-03-23 NOTE — Plan of Care (Signed)
  Problem: Metabolic: Goal: Ability to maintain appropriate glucose levels will improve Outcome: Progressing   Problem: Respiratory: Goal: Will regain and/or maintain adequate ventilation Outcome: Progressing   Problem: Urinary Elimination: Goal: Ability to achieve and maintain adequate renal perfusion and functioning will improve Outcome: Progressing   

## 2019-03-23 NOTE — Consult Note (Addendum)
Cardiology Consultation:   Patient ID: [name] MRN: [id number]; DOB: [birth year]  Admit date: 03/22/2019 Date of Consult: 03/23/2019  Primary Care Provider: Lorelee Market, MD Primary Cardiologist: New to [facility] (Dr. Haroldine Laws) Primary Electrophysiologist:  None    Patient Profile:   Cheryl Mercer is a 33 y.o. female with a past medical history of hypertension and type 2 diabetes mellitus but no known cardiac history, who is being seen today for the evaluation of acute CHF at the request of Dr. Lonny Prude (Internal Medicine).  History of Present Illness:   [name] is a 33 year old female with a past medical history of hypertension and type 2 diabetes mellitus but no known cardiac history. Patient was seen in the ED on 02/04/2019 for chest pain that was worse with exertion and when laying flat, shortness of breath, hot sweats/chills, and tingling in her hands and feet. EKG showed sinus tachycardia, rate 110, with some non-specific changes. She was noted to have cardiomegaly on exam. Patient was started on Lopressor 52m twice daily and discharged with instructions to follow-up with PCP. BNP came back after being discharged and was elevated at 1,418.4.  Patient presented to the ED yesterday for progression of symptoms. Patient reports chest pain since the beginning of June.  She reports a substernal nonradiating pain that she describes as a "weight on her chest."  Pain is worse with activity and when laying flat.  Patient states pain is a 10 out of 10 on the pain scale at times.  Sometimes pain is so bad that she vomits.  She does think anxiety is playing some role in the chest pain as pain relieved some when she takes deep breaths. She states the pain has been almost constant for the last 2 months. She also notes brief episodes of sharp pain on the left and right sides of her chest that lasts for less than 1 minute at a time. Patient reports worsening shortness of breath  at rest and with exertion as well as significant orthopnea, PND, lower extremity edema.  She does think she has had some weight gain as her clothes have been fitting tighter. Patient states her legs felt so heavy yesterday from the swelling that she fell and hit her knee on the steps. She reports having palpitations and lightheadedness/dizziness but denies any syncope. She reports some body chills and body aches but denies any recent illnesses, fevers, nasal congestion, cough, or known exposure to the coronavirus.   In the ED, patient tachycardic and hypertensive. EKG showed sinus tachycardia, rate 122 bpm, with left axis deviation, left atrial enlargement, and non-specific ST/T changes. High-sensitivity troponin negative x2. Chest x-ray showed no acute findings. Chest CTA showed cardiomegaly with no evidence of pulmonary embolus. BNP elevated at 962.3. WBC 6.9, Hgb 12.8, Plts 292. Na 132, K 3.6, Glucose 530, SCr 0.71. Urinalysis with specific gravity of 1.036, >/= 500 glucose, small Hgb, >/= 300 protein, and many bacteria but no leukocytes or nitrites. Urine drug screen positive for amphetamines. COVID-19 testing negative. Patient was admitted for further evaluation/management of presumed CHF.  At the time of this evaluation, patient breathing okay at rest. She thinks her lower extremity swelling has improved some with the Lasix. She reports some mild 3/10 chest pain right now but states it is not too bad.  Patient tried smoking for 1 month as a teenager but denies any tobacco use since that time. She reports occasional alcohol use and states she drinks one glass wine or  beer about once to twice a month but denies any use of hard liquor. She reports previous marijuana and cocaine use. She states she last used cocaine over 10 years ago. She denied any drug use at this time but urine drug screen positive for amphetamines. Patient does have a family history of heart disease on her mother's side. Her mother had a  heart attack in her early 83's and has also had a stroke. Her maternal grandmother had a CABG in her late 40's/early 24's and her maternal grandfather died of a heart attack in his early 24's.   Of note, patient has a history of hypertension and type 2 diabetes mellitus but is not compliant with medications. Previously on Lisinopril but has not used this in over a year.  Heart Pathway Score:     Past Medical History:  Diagnosis Date   Chronic back pain    Diabetes mellitus without complication ([place])    Migraines    No pertinent past medical history    Pregnancy induced hypertension     Past Surgical History:  Procedure Laterality Date   CERVICAL BIOPSY  W/ LOOP ELECTRODE EXCISION  2009   Laparoscopic tubal sterilization with Falope rings.  2009   TUBAL LIGATION       Home Medications:  Prior to Admission medications   Medication Sig Start Date End Date Taking? Authorizing Provider  acetaminophen (TYLENOL) 500 MG tablet Take 1 tablet (500 mg total) by mouth every 6 (six) hours as needed. Patient taking differently: Take 500 mg by mouth every 6 (six) hours as needed for mild pain.  07/19/15  Yes Domenic Moras, PA-C  metFORMIN (GLUCOPHAGE) 500 MG tablet Take 1 tablet (500 mg total) by mouth 2 (two) times daily with a meal. 02/04/19  Yes Burky, Natalie B, NP  gabapentin (NEURONTIN) 300 MG capsule Take 300 mg by mouth 3 (three) times daily.    [provider]  lisinopril (ZESTRIL) 10 MG tablet Take 10 mg by mouth daily.    [provider]  potassium chloride (K-DUR) 10 MEQ tablet Take 1 tablet (10 mEq total) by mouth 2 (two) times daily for 7 days. Patient not taking: Reported on 03/23/2019 04/17/18 04/24/18  Lennice Sites, DO    Inpatient Medications: Scheduled Meds:  enoxaparin (LOVENOX) injection  40 mg Subcutaneous Q24H   insulin aspart  0-15 Units Subcutaneous TID WC   insulin aspart  4 Units Subcutaneous TID WC   insulin glargine  12 Units Subcutaneous  Daily   insulin starter kit- pen needles  1 kit Other Once   metoprolol tartrate  12.5 mg Oral BID   Continuous Infusions:  PRN Meds: acetaminophen **OR** acetaminophen, hydrALAZINE, ondansetron **OR** ondansetron (ZOFRAN) IV  Allergies:    Allergies  Allergen Reactions   Ibuprofen Anaphylaxis    Social History:   Social History   Socioeconomic History   Marital status: Married    Spouse name: Not on file   Number of children: Not on file   Years of education: Not on file   Highest education level: Not on file  Occupational History   Not on file  Social Needs   Financial resource strain: Not on file   Food insecurity    Worry: Not on file    Inability: Not on file   Transportation needs    Medical: Not on file    Non-medical: Not on file  Tobacco Use   Smoking status: Never Smoker   Smokeless tobacco: Never Used  Substance and Sexual Activity   Alcohol use: No    Alcohol/week: 6.0 standard drinks    Types: 6 Shots of liquor per week   Drug use: No    Types: Marijuana   Sexual activity: Not Currently    Birth control/protection: Surgical    Comment: band placed around tubes in 2009 surgically by Dr. Glo Herring  Lifestyle   Physical activity    Days per week: Not on file    Minutes per session: Not on file   Stress: Not on file  Relationships   Social connections    Talks on phone: Not on file    Gets together: Not on file    Attends religious service: Not on file    Active member of club or organization: Not on file    Attends meetings of clubs or organizations: Not on file    Relationship status: Not on file   Intimate partner violence    Fear of current or ex partner: Not on file    Emotionally abused: Not on file    Physically abused: Not on file    Forced sexual activity: Not on file  Other Topics Concern   Not on file  Social History Narrative   Not on file    Family History:    Family History  Problem Relation Age of  Onset   Hypertension Mother    Anesthesia problems Neg Hx      ROS:  Please see the history of present illness.  Review of Systems  Constitutional: Positive for chills. Negative for fever.  HENT: Negative for congestion.   Respiratory: Positive for shortness of breath. Negative for cough and hemoptysis.   Cardiovascular: Positive for chest pain, palpitations, orthopnea, leg swelling and PND.  Gastrointestinal: Positive for vomiting. Negative for blood in stool and melena.  Genitourinary: Negative for hematuria.  Musculoskeletal: Positive for falls and myalgias.  Neurological: Positive for dizziness. Negative for loss of consciousness.  Endo/Heme/Allergies: Does not bruise/bleed easily.  Psychiatric/Behavioral: Positive for substance abuse.  All other ROS reviewed and negative.     Physical Exam/Data:   Vitals:   03/23/19 0209 03/23/19 0241 03/23/19 0830 03/23/19 1209  BP: 128/85 (!) 132/99 (!) 136/105 (!) 137/94  Pulse: 96 100 99 97  Resp:   16   Temp:  97.9 F (36.6 C) 97.7 F (36.5 C) 97.7 F (36.5 C)  TempSrc:  Oral Oral Oral  SpO2: 99% 100% 100% 100%  Weight:  72.8 kg    Height:  5' 1"  (1.549 m)      Intake/Output Summary (Last 24 hours) at 03/23/2019 1558 Last data filed at 03/23/2019 1100 Gross per 24 hour  Intake 65.95 ml  Output 2700 ml  Net -2634.05 ml   Last 3 Weights 03/23/2019 03/23/2019 12/10/2018  Weight (lbs) 160 lb 7.9 oz 190 lb 164 lb  Weight (kg) 72.8 kg 86.183 kg 74.39 kg     Body mass index is 30.33 kg/m.  General: 33 y.o. female resting comfortably in no acute distress. HEENT: Normocephalic and atraumatic. Sclera clear. EOMs intact. Neck: Supple. No carotid bruits. JVD elevated to jaw line. Heart: Tachycardic with regular rhythm. Distinct S1 and S2. No significant murmurs, gallops, or rubs. Radial pulses 2+ and equal bilaterally. Lungs: No increased work of breathing. Clear to ausculation bilaterally. No wheezes, rhonchi, or rales.  Abdomen: Soft,  non-distended, and non-tender to palpation. Bowel sounds present. MSK: Normal strength and tone for age. Extremities: 2-3+ pitting edema of bilateral lower extremities  up to knees. Skin: Slightly cool to touch and dry. Neuro: Alert and oriented x3. No focal deficits. Psych: Normal affect. Responds appropriately.  EKG:  The EKG was personally reviewed and demonstrates:  sinus tachycardia, rate 122 bpm, with left axis deviation, left atrial enlargement, and non-specific ST/T changes. Telemetry:  Telemetry was personally reviewed and demonstrates:  Sinus tachycardia with rates in the low 100's to 110's.  Relevant CV Studies:  Echocardiogram 03/23/2019: Impressions:  1. The left ventricle has a visually estimated ejection fraction of 10-15%%. The cavity size was severely dilated. Left ventricular diastolic Doppler parameters are consistent with restrictive filling.  2. The right ventricle has moderately reduced systolic function. The cavity was normal. There is no increase in right ventricular wall thickness. Right ventricular systolic pressure is mildly elevated with an estimated pressure of 33.7 mmHg.  3. Left atrial size was moderately dilated.  4. Right atrial size was mildly dilated.  5. No evidence of mitral valve stenosis.  6. No stenosis of the aortic valve.  7. The aorta is normal in size and structure.  8. The aortic root and ascending aorta are normal in size and structure.  9. The inferior vena cava was dilated in size with <50% respiratory variability.  Laboratory Data:  High Sensitivity Troponin:   Recent Labs  Lab 03/22/19 1659 03/22/19 2139  TROPONINIHS 12 15     Cardiac EnzymesNo results for input(s): TROPONINI in the last 168 hours. No results for input(s): TROPIPOC in the last 168 hours.  Chemistry Recent Labs  Lab 03/22/19 1659 03/23/19 0058 03/23/19 0734  NA 132* 135 140  K 3.6 3.5 3.3*  CL 98 100 100  CO2 22 24 29   GLUCOSE 530* 439* 93  BUN <5* <5* 6    CREATININE 0.71 0.69 0.65  CALCIUM 8.4* 8.3* 8.6*  GFRNONAA >60 >60 >60  GFRAA >60 >60 >60  ANIONGAP 12 11 11     Recent Labs  Lab 03/23/19 0053  PROT 6.0*  ALBUMIN 2.8*  AST 41  ALT 19  ALKPHOS 122  BILITOT 0.8   Hematology Recent Labs  Lab 03/22/19 1659  WBC 6.9  RBC 4.39  HGB 12.8  HCT 39.7  MCV 90.4  MCH 29.2  MCHC 32.2  RDW 13.3  PLT 292   BNP Recent Labs  Lab 03/22/19 2154  BNP 962.3*    DDimer No results for input(s): DDIMER in the last 168 hours.   Radiology/Studies:  Dg Chest 2 View  Result Date: 03/22/2019 CLINICAL DATA:  33 year old female with history of shortness of breath with central and left-sided chest pain. EXAM: CHEST - 2 VIEW COMPARISON:  Chest x-ray 02/04/2019. FINDINGS: Lung volumes are normal. No consolidative airspace disease. No pleural effusions. No pneumothorax. No pulmonary nodule or mass noted. Pulmonary vasculature and the cardiomediastinal silhouette are within normal limits. IMPRESSION: No radiographic evidence of acute cardiopulmonary disease. Electronically Signed   By: Vinnie Langton M.D.   On: 03/22/2019 17:34   Ct Angio Chest Pe W And/or Wo Contrast  Result Date: 03/22/2019 CLINICAL DATA:  Chest pain, ankle swelling EXAM: CT ANGIOGRAPHY CHEST WITH CONTRAST TECHNIQUE: Multidetector CT imaging of the chest was performed using the standard protocol during bolus administration of intravenous contrast. Multiplanar CT image reconstructions and MIPs were obtained to evaluate the vascular anatomy. CONTRAST:  44m OMNIPAQUE IOHEXOL 350 MG/ML SOLN COMPARISON:  Chest x-ray earlier today FINDINGS: Cardiovascular: No filling defects in the pulmonary arteries to suggestpulmonary emboli. Cardiomegaly. No evidence of aortic aneurysm. Mediastinum/Nodes:  No mediastinal, hilar, or axillary adenopathy. Trachea and esophagus are unremarkable. Thyroid unremarkable. Lungs/Pleura: Lungs are clear. No focal airspace opacities or suspicious nodules. No  effusions. Upper Abdomen: Imaging into the upper abdomen shows no acute findings. Musculoskeletal: Chest wall soft tissues are unremarkable. No acute bony abnormality. Review of the MIP images confirms the above findings. IMPRESSION: No evidence of pulmonary embolus. Cardiomegaly.  No acute cardiopulmonary disease. Electronically Signed   By: Rolm Baptise M.D.   On: 03/22/2019 23:12   Dg Knee Complete 4 Views Right  Result Date: 03/22/2019 CLINICAL DATA:  Knee pain and swelling, no known injury, initial encounter EXAM: RIGHT KNEE - COMPLETE 4+ VIEW COMPARISON:  None. FINDINGS: Generalized soft tissue swelling is noted. No acute fracture or dislocation is seen. No joint effusion is seen. No other focal abnormality is noted. IMPRESSION: Soft tissue swelling without acute bony abnormality. Electronically Signed   By: Inez Catalina M.D.   On: 03/22/2019 23:18    Assessment and Plan:   Newly Diagnosed Combined CHF - Patient presented with chest pain, shortness of breath, orthopnea, PND, and lower extremity edema. - Chest x-ray showed cardiomegaly but no acute findings. - BNP elevated at 962.3. - Echo showed severely dilated LV with EF of 10-15% and grade 3 diastolic dysfunction. RV systolic function also noted to be moderately reduced with mildly elevated RVSP of 33.7 mmHg. - HIV negative.  - Sed rate elevated at 25 but CRP normal. - Patient was given one dose of IV Lasix 87m with good response. Documented urinary output of 2.7 L since admission. - Will start IV Lasix 462mtwice daily given patient is Lasix naive and had good response after one dose. Will also add K-Dur 2025mtwice daily with IV diuresis. - Will start Entresto 24-26 twice daily, Spironolactone 12.5mg18mily, and Digoxin 0.125mg28mly. - Will discontinue Lopressor at this time due to concern for low cardiac output.  - Will order compression stockings. - Continue to monitor daily weights, strict I/O's, and renal function. Continue low  sodium diet. - Possible etiology includes ischemic cardiomyopathy, hypertensive/diabetic cardiomyopathy, viral cardiomyopathy, myocarditis, and amphetamine use. Patient will ultimately need right/left heart catheterization. Possibly on Friday after additional diuresis.  Chest Pain - Patient reports chest pain that has been almost constant for the past 2 months. Worse with lying down and with exertion. - EKG shows not acute ischemic changes. - High-sensitivity troponin negative x2.  - Echo as above. - Will check lipid panel. - Cardiovascular risk factor include poorly controlled hypertension, diabetes, and family history. Chest pain sounds atypical but will assess with heart catheterization as stated above.  Hypertension - Systolic BP has high as the 160's. Most recent BP 137/94. - Will start Entresto and Spironolactone as above.  Poorly Controlled Diabetes Mellitus  - Hemoglobin A1c 13.9. Significant proteinuria on urinalysis. - Management per primary team.   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 She states she last used cocaine over 10 years ago. She denied any current drug use but urine drug screen came back positive for amphetamines.  - Complete cessation advised.  Otherwise, per primary team.   For questions or updates, please contact [facility] consult [email or web address] for contact info under     Signed, CalliDarreld McleanC  03/23/2019 3:58 PM   Patient seen and examined with the above-signed Advanced Practice Provider and/or Housestaff. I personally reviewed laboratory data, imaging studies and relevant notes. I independently examined the patient and formulated the  important aspects of the plan. I have edited the note to reflect any of my changes or salient points. I have personally discussed the plan with the patient and/or family.  33 y/o woman with uncontrolled HTN and DM2 (x about 4 years) presents with 2-3 month h/o of HF  symptoms and intermittent CP. H/o premature CAD in mother and MGM (MIs in 33s).   Echo performed with EF 15% and moderate RV dysfunction. Personally reviewed  ECG sinus tach 110 prwp Personally reviewed   On exam  JVP ~10 Cor tachy reg +s3 Lungs CTA  Abd obese NT Ext 2+ edema cool  She has acute HF with severe biventricular HF and volume overload. Suspect marginal output. Likely etiology is uncontrolled DM/HTN but also clear RFs for CAD. Start HF meds as above. Diurese. Plan cath Friday. If no significant CAD will need cMRI to look for possible remote myocarditis.   Glori Bickers, MD  6:14 PM

## 2019-03-23 NOTE — H&P (Signed)
History and Physical    Cheryl Mercer ZWC:[id number] DOB: 11-11-1985 DOA: 03/22/2019  PCP: Evelene Croon, MD  Patient coming from: Home  I have personally briefly reviewed patient's old medical records in [facility]  Chief Complaint: Leg swelling  HPI: Cheryl Mercer is a 33 y.o. female with medical history significant of DM2 on metformin, HTN not currently taking meds.  Patient presents to the ED with c/o BLE edema and SOB.  Onset about 1 month ago with progressive worsening since then.  Especially worse over past 1 week.  Today when she was walking up the stairs she states her legs felt so heavy that she fell and landed on her right knee.  She has been ambulatory since the fall but states her right knee aches.  She did not hit her head, no LOC.  She has been wearing compression stockings, soaking her feet in Epsom salt and elevating her feet without any changes in the swelling.  Associated DOE.  No fevers, chills, cough, CP.    ED Course: BGL 530, only went to 460 after 10u  novolog.  BNP 962, UA with frank proteinuria.   Review of Systems: As per HPI, otherwise all review of systems negative.  Past Medical History:  Diagnosis Date  . Chronic back pain   . Diabetes mellitus without complication (HCC)   . Migraines   . No pertinent past medical history   . Pregnancy induced hypertension     Past Surgical History:  Procedure Laterality Date  . CERVICAL BIOPSY  W/ LOOP ELECTRODE EXCISION  2009  . Laparoscopic tubal sterilization with Falope rings.  2009  . TUBAL LIGATION       reports that she has never smoked. She has never used smokeless tobacco. She reports that she does not drink alcohol or use drugs.  Allergies  Allergen Reactions  . Ibuprofen Anaphylaxis    Family History  Problem Relation Age of Onset  . Hypertension Mother   . Anesthesia problems Neg Hx      Prior to Admission medications   Medication Sig Start Date End  Date Taking? Authorizing Provider  acetaminophen (TYLENOL) 500 MG tablet Take 1 tablet (500 mg total) by mouth every 6 (six) hours as needed. Patient taking differently: Take 500 mg by mouth every 6 (six) hours as needed for mild pain.  07/19/15   Fayrene Helper, PA-C  benzonatate (TESSALON PERLES) 100 MG capsule Take 1 or 2 every 8 hours as needed for cough. Patient not taking: Reported on 04/13/2017 06/23/16   Tommi Rumps, PA-C  brompheniramine-pseudoephedrine-DM 30-2-10 MG/5ML syrup Take 5 mLs by mouth 4 (four) times daily as needed. Patient not taking: Reported on 04/17/2018 11/04/17   Joni Reining, PA-C  cyclobenzaprine (FLEXERIL) 10 MG tablet Take 1 tablet (10 mg total) by mouth 2 (two) times daily as needed for up to 12 doses for muscle spasms. 04/17/18   Curatolo, Adam, DO  metFORMIN (GLUCOPHAGE) 500 MG tablet Take 1 tablet (500 mg total) by mouth 2 (two) times daily with a meal. 02/04/19   Linus Mako B, NP  metoprolol tartrate (LOPRESSOR) 25 MG tablet Take 1 tablet (25 mg total) by mouth 2 (two) times daily. 02/04/19   Georgetta Haber, NP  Multiple Vitamin (MULTIVITAMIN) tablet Take 1 tablet by mouth daily. One-A-Day    [provider]  naproxen (NAPROSYN) 500 MG tablet Take 1 tablet (500 mg total) by mouth 2 (two) times daily with a meal. 11/04/17  Sable Feil, PA-C  ondansetron (ZOFRAN) 4 MG tablet Take 1 tablet (4 mg total) by mouth daily as needed. Patient not taking: Reported on 04/13/2017 08/27/16   Orbie Pyo, MD  phenazopyridine (PYRIDIUM) 200 MG tablet Take 1 tablet (200 mg total) by mouth 3 (three) times daily as needed for pain. Patient not taking: Reported on 04/13/2017 02/04/16   Menshew, Dannielle Karvonen, PA-C  potassium chloride (K-DUR) 10 MEQ tablet Take 1 tablet (10 mEq total) by mouth 2 (two) times daily for 7 days. 04/17/18 04/24/18  Curatolo, Adam, DO  predniSONE (STERAPRED UNI-PAK 21 TAB) 10 MG (21) TBPK tablet Take 1 tablet (10 mg total) by mouth  daily. Take as directed on packaging Patient not taking: Reported on 04/13/2017 08/13/16   Nance Pear, MD    Physical Exam: Vitals:   03/22/19 2010 03/22/19 2215 03/22/19 2230 03/22/19 2330  BP: (!) 139/112 (!) 151/116  (!) 156/117  Pulse: (!) 117  (!) 114 (!) 116  Resp: 18     Temp:      TempSrc:      SpO2: 99%  100% 100%    Constitutional: NAD, calm, comfortable Eyes: PERRL, lids and conjunctivae normal ENMT: Mucous membranes are moist. Posterior pharynx clear of any exudate or lesions.Normal dentition.  Neck: normal, supple, no masses, no thyromegaly Respiratory: clear to auscultation bilaterally, no wheezing, no crackles. Normal respiratory effort. No accessory muscle use.  Cardiovascular: Tachycardic, 2+ BLE edema Abdomen: no tenderness, no masses palpated. No hepatosplenomegaly. Bowel sounds positive.  Musculoskeletal: no clubbing / cyanosis. No joint deformity upper and lower extremities. Good ROM, no contractures. Normal muscle tone.  Skin: no rashes, lesions, ulcers. No induration Neurologic: CN 2-12 grossly intact. Sensation intact, DTR normal. Strength 5/5 in all 4.  Psychiatric: Normal judgment and insight. Alert and oriented x 3. Normal mood.    Labs on Admission: I have personally reviewed following labs and imaging studies  CBC: Recent Labs  Lab 03/22/19 1659  WBC 6.9  HGB 12.8  HCT 39.7  MCV 90.4  PLT 950   Basic Metabolic Panel: Recent Labs  Lab 03/22/19 1659  NA 132*  K 3.6  CL 98  CO2 22  GLUCOSE 530*  BUN <5*  CREATININE 0.71  CALCIUM 8.4*   GFR: CrCl cannot be calculated (Unknown ideal weight.). Liver Function Tests: No results for input(s): AST, ALT, ALKPHOS, BILITOT, PROT, ALBUMIN in the last 168 hours. No results for input(s): LIPASE, AMYLASE in the last 168 hours. No results for input(s): AMMONIA in the last 168 hours. Coagulation Profile: No results for input(s): INR, PROTIME in the last 168 hours. Cardiac Enzymes: No  results for input(s): CKTOTAL, CKMB, CKMBINDEX, TROPONINI in the last 168 hours. BNP (last 3 results) No results for input(s): PROBNP in the last 8760 hours. HbA1C: No results for input(s): HGBA1C in the last 72 hours. CBG: No results for input(s): GLUCAP in the last 168 hours. Lipid Profile: No results for input(s): CHOL, HDL, LDLCALC, TRIG, CHOLHDL, LDLDIRECT in the last 72 hours. Thyroid Function Tests: No results for input(s): TSH, T4TOTAL, FREET4, T3FREE, THYROIDAB in the last 72 hours. Anemia Panel: No results for input(s): VITAMINB12, FOLATE, FERRITIN, TIBC, IRON, RETICCTPCT in the last 72 hours. Urine analysis:    Component Value Date/Time   COLORURINE YELLOW 03/22/2019 2307   APPEARANCEUR HAZY (A) 03/22/2019 2307   LABSPEC 1.036 (H) 03/22/2019 2307   PHURINE 5.0 03/22/2019 2307   GLUCOSEU >=500 (A) 03/22/2019 2307   HGBUR SMALL (  A) 03/22/2019 2307   BILIRUBINUR NEGATIVE 03/22/2019 2307   KETONESUR NEGATIVE 03/22/2019 2307   PROTEINUR >=300 (A) 03/22/2019 2307   UROBILINOGEN 0.2 02/04/2019 1720   NITRITE NEGATIVE 03/22/2019 2307   LEUKOCYTESUR NEGATIVE 03/22/2019 2307    Radiological Exams on Admission: Dg Chest 2 View  Result Date: 03/22/2019 CLINICAL DATA:  33 year old female with history of shortness of breath with central and left-sided chest pain. EXAM: CHEST - 2 VIEW COMPARISON:  Chest x-ray 02/04/2019. FINDINGS: Lung volumes are normal. No consolidative airspace disease. No pleural effusions. No pneumothorax. No pulmonary nodule or mass noted. Pulmonary vasculature and the cardiomediastinal silhouette are within normal limits. IMPRESSION: No radiographic evidence of acute cardiopulmonary disease. Electronically Signed   By: Trudie Reedaniel  Entrikin M.D.   On: 03/22/2019 17:34   Ct Angio Chest Pe W And/or Wo Contrast  Result Date: 03/22/2019 CLINICAL DATA:  Chest pain, ankle swelling EXAM: CT ANGIOGRAPHY CHEST WITH CONTRAST TECHNIQUE: Multidetector CT imaging of the chest was  performed using the standard protocol during bolus administration of intravenous contrast. Multiplanar CT image reconstructions and MIPs were obtained to evaluate the vascular anatomy. CONTRAST:  80mL OMNIPAQUE IOHEXOL 350 MG/ML SOLN COMPARISON:  Chest x-ray earlier today FINDINGS: Cardiovascular: No filling defects in the pulmonary arteries to suggestpulmonary emboli. Cardiomegaly. No evidence of aortic aneurysm. Mediastinum/Nodes: No mediastinal, hilar, or axillary adenopathy. Trachea and esophagus are unremarkable. Thyroid unremarkable. Lungs/Pleura: Lungs are clear. No focal airspace opacities or suspicious nodules. No effusions. Upper Abdomen: Imaging into the upper abdomen shows no acute findings. Musculoskeletal: Chest wall soft tissues are unremarkable. No acute bony abnormality. Review of the MIP images confirms the above findings. IMPRESSION: No evidence of pulmonary embolus. Cardiomegaly.  No acute cardiopulmonary disease. Electronically Signed   By: Charlett NoseKevin  Dover M.D.   On: 03/22/2019 23:12   Dg Knee Complete 4 Views Right  Result Date: 03/22/2019 CLINICAL DATA:  Knee pain and swelling, no known injury, initial encounter EXAM: RIGHT KNEE - COMPLETE 4+ VIEW COMPARISON:  None. FINDINGS: Generalized soft tissue swelling is noted. No acute fracture or dislocation is seen. No joint effusion is seen. No other focal abnormality is noted. IMPRESSION: Soft tissue swelling without acute bony abnormality. Electronically Signed   By: Alcide CleverMark  Lukens M.D.   On: 03/22/2019 23:18    EKG: Independently reviewed.  Assessment/Plan Principal Problem:   Acute diastolic CHF (congestive heart failure) (HCC) Active Problems:   Hyperglycemia   Peripheral edema   Cardiomegaly   Proteinuria    1. Acute diastolic CHF - DDx includes nephrotic syndrome or anasarca from liver 1. Suspicious that the tachycardia may be playing significant role, between this and the HTN: will start BB metoprolol 12.5mg  PO BID to start,  titrate up as able. 2. Lasix 40mg  IV x1 as test dose 3. 2D echo 4. UDS 5. Check LFT 6. Strict intake and output 7. Repeat BMP in AM 8. Tele monitor 2. Hyperglycemia - 1. Glucostabillizer 2. Hold home metformin 3. Repeat BMP in AM, replace K if needed  DVT prophylaxis: Lovenox Code Status: Full Family Communication: No family in room Disposition Plan: Home after admit Consults called: None Admission status: Place in 22obs    ,  M. DO [facility]  How to contact the [facility] Attending or Consulting provider 7A - 7P or covering provider during after hours 7P -7A, for this patient?  1. Check the care team in [facility] and look for a) attending/consulting [facility] provider listed and b) the [facility] team listed 2. Log  into [email or web address]  Amion Physician Scheduling and messaging for groups and whole hospitals  On call and physician scheduling software for group practices, residents, hospitalists and other medical providers for call, clinic, rotation and shift schedules. On[employer] is a hospital-wide system for scheduling doctors and paging doctors on call. EasyPlot is for scientific plotting and data analysis.  [email or web address]  and use [place]'s universal password to access. If you do not have the password, please contact the hospital operator.  3. Locate the [facility] provider you are looking for under [facility] and page to a number that you can be directly reached. 4. If you still have difficulty reaching the provider, please page the [facility] (Director on Call) for the Hospitalists listed on amion for assistance.  03/23/2019, 12:32 AM

## 2019-03-23 NOTE — Plan of Care (Signed)
  Problem: Education: Goal: Ability to describe self-care measures that may prevent or decrease complications (Diabetes Survival Skills Education) will improve Outcome: Progressing Goal: Individualized Educational Video(s) Outcome: Progressing   Problem: Education: Goal: Ability to describe self-care measures that may prevent or decrease complications (Diabetes Survival Skills Education) will improve 03/23/2019 1229 by Fabienne Bruns D, RN Outcome: Progressing 03/23/2019 1229 by Thressa Sheller, RN Outcome: Progressing Goal: Individualized Educational Video(s) 03/23/2019 1229 by Thressa Sheller, RN Outcome: Progressing 03/23/2019 1229 by Thressa Sheller, RN Outcome: Progressing

## 2019-03-23 NOTE — Progress Notes (Addendum)
Inpatient Diabetes Program Recommendations  AACE/ADA: New Consensus Statement on Inpatient Glycemic Control (2015)  Target Ranges:  Prepandial:   less than 140 mg/dL      Peak postprandial:   less than 180 mg/dL (1-2 hours)      Critically ill patients:  140 - 180 mg/dL   Lab Results  Component Value Date   GLUCAP 100 (H) 03/23/2019   HGBA1C 13.9 (H) 03/23/2019    Review of Glycemic Control Results for Cheryl Mercer, Cheryl Mercer (MRN [id number]) as of 03/23/2019 10:10  Ref. Range 03/23/2019 05:15 03/23/2019 06:21 03/23/2019 07:19 03/23/2019 08:26  Glucose-Capillary Latest Ref Range: 70 - 99 mg/dL 163 (H) 140 (H) 102 (H) 100 (H)   Diabetes history: Type 2 DM Outpatient Diabetes medications: metformin 500 mg BID Current orders for Inpatient glycemic control: Novolog 0-15 units TID  Inpatient Diabetes Program Recommendations:    Consider starting Lantus 10 units QD. Anticipate insulin at discharge given A1C and current needs.  Will place consult for dietitian, order LWWDM booklet and insulin starter kit.  Will plan to speak with patient today.  Secure chat sent to MD  Addendum_0 : noted CBG following meal. Could also consider adding Novolog 4 units TID (assuming patient is consuming >50% of meal).   _1  secure chat sent. Spoke with patient regarding diabetes management.  Reviewed patient's current A1c of 13.9%. Explained what a A1c is and what it measures. Also reviewed goal A1c with patient, importance of good glucose control @ home, and blood sugar goals. Reviewed at length patho of DM, need for insulin, role of pancreas, signs and symptoms of hypo vs hyper glycemia, interventions, vascular changes and co morbidities. Patient will need a blood glucose meter. Blood glucose meter (includes lancets and strips) ([id number]). Given Medicaid and pending PCP follow up (per CM) provided information on purchasing Relion meter through [employer]. Information attached to DC summary. Encouraged to check blood  sugars 3 times per day and reviewed when to call MD. Reviewed the importance of being mindful of diet, consumption of carbohydrates and eliminating sugary beverages from diet. Patient admits, "I just ignored the fact I had diabetes, I did not want to do anything about it and would rather take care of everyone else." discussion towards goal setting, finding ways of eliminating carbs from diet and being mindful, as well as starting to find ways of balancing care and beginning to care for herself so that she could be around for her children.  Encouraged patient to review LWWDM and insulin starter kit. Will plan to see 8/6 for insulin pen teaching. Patient has no further questions at this time.   Thanks, Bronson Curb, MSN, RNC-OB Diabetes Coordinator [phone] (8a-5p)

## 2019-03-23 NOTE — Plan of Care (Signed)
Nutrition Education Note  RD consulted for nutrition education regarding diabetes. RD also used this opportunity to provide education regarding CHF.  Lab Results  Component Value Date   HGBA1C 13.9 (H) 03/23/2019   Spoke with pt at bedside. Pt eating lunch at time of RD visit.  Pt reports that about 2 months ago she started noticing swelling issues in her legs. Pt states she was told she had some "heart problems" and made some changes in her diet at that time. Pt cut out dark sodas (Dr. Malachi Bonds) and started drinking Sprite and ginger ale instead. Pt states that she also tried to eat less fried foods.  Pt reports that she has had a diabetes diagnosis for about 4.5 years. Pt states that she has basically ignored the diagnosis other than taking Metformin. Pt states that she didn't want to believe that the diagnosis was real. Pt reports that she occasionally has symptoms of low blood sugars like shaking, sweating, and foggy brain. Pt reports that her children tell her that she needs to eat something but that she normally doesn't get a chance to until later due to being so busy. Pt states she has 4 children ranging from age 77 to teenager. Pt reports that she often finds that she does not take care of herself because she is so busy taking care of her children. For example, pt reports that she does not eat breakfast, lunch, or a snack and typically only eats 1 meal at dinnertime.  RD provided "Carbohydrate Counting for People with Diabetes" handout from the Academy of Nutrition and Dietetics. Discussed different food groups and their effects on blood sugar, emphasizing carbohydrate-containing foods. Pt able to name some carbohydrate-containing foods: regular sodas, fruit, crackers. RD provided a list of carbohydrates and recommended serving sizes of common foods.  Discussed importance of controlled and consistent carbohydrate intake throughout the day. Provided examples of ways to balance meals/snacks and  encouraged intake of high-fiber, whole grain complex carbohydrates. 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 Teach back method used.  RD also provided "Low Sodium Nutrition Therapy" and "Sodium-Free Flavoring Tips" handouts from the Academy of Nutrition and Dietetics. Provided examples on ways to decrease sodium intake in diet. Discouraged intake of processed foods and use of salt shaker. Encouraged fresh fruits and vegetables as well as whole grain sources of carbohydrates to maximize fiber intake.  Pt states that she only really uses salt to season her foods. Discussed sodium-free seasoning options.  RD discussed why it is important for patient to adhere to diet recommendations, and emphasized the role of fluids, foods to avoid, and importance of weighing self daily. Teach back method used.  Expect fair compliance.  Body mass index is 30.33 kg/m. Pt meets criteria for obesity class I based on current BMI.  Current diet order is 2 gram sodium. No meal completions recorded in chart this time. Labs and medications reviewed. No further nutrition interventions warranted at this time. RD contact information provided. If additional nutrition issues arise, please re-consult RD.    Cheryl Face, MS, RD, LDN Inpatient Clinical Dietitian Pager: [phone] Weekend/After Hours: [phone]

## 2019-03-23 NOTE — Plan of Care (Signed)
  Problem: Cardiac: Goal: Ability to maintain an adequate cardiac output will improve Outcome: Progressing   Problem: Health Behavior/Discharge Planning: Goal: Ability to manage health-related needs will improve Outcome: Progressing   Problem: Fluid Volume: Goal: Ability to achieve a balanced intake and output will improve Outcome: Progressing   Problem: Metabolic: Goal: Ability to maintain appropriate glucose levels will improve Outcome: Progressing

## 2019-03-23 NOTE — Progress Notes (Signed)
Patient seen and examined at bedside, patient admitted after midnight, please see earlier detailed admission note by Etta Quill, DO. Briefly, patient presented with worsening dyspnea and bilateral LE edema concerning for heart failure. Patient given one dose of lasix with good improvement of dyspnea overnight. No PE on CTA. Will obtain 24 hour urine collection and will await Transthoracic Echocardiogram. Transition off of glucose stabilizer, obtain hemoglobin A1C and place on 2g sodium diet. Will ask pharmacy for medication reconciliation as patient states she is on blood pressure medication. Hydralazine IV prn for now.   Cordelia Poche, MD [facility] 03/23/2019, 8:47 AM

## 2019-03-24 DIAGNOSIS — I10 Essential (primary) hypertension: Secondary | ICD-10-CM

## 2019-03-24 LAB — BASIC METABOLIC PANEL
Anion gap: 12 (ref 5–15)
BUN: 8 mg/dL (ref 6–20)
CO2: 27 mmol/L (ref 22–32)
Calcium: 8.3 mg/dL — ABNORMAL LOW (ref 8.9–10.3)
Chloride: 95 mmol/L — ABNORMAL LOW (ref 98–111)
Creatinine, Ser: 0.62 mg/dL (ref 0.44–1.00)
GFR calc Af Amer: >60 mL/min (ref >60)
GFR calc non Af Amer: >60 mL/min (ref >60)
Glucose, Bld: 326 mg/dL — ABNORMAL HIGH (ref 70–99)
Potassium: 3.5 mmol/L (ref 3.5–5.1)
Sodium: 134 mmol/L — ABNORMAL LOW (ref 135–145)

## 2019-03-24 LAB — GLUCOSE, CAPILLARY
Glucose-Capillary: 290 mg/dL — ABNORMAL HIGH (ref 70–99)
Glucose-Capillary: 221 mg/dL — ABNORMAL HIGH (ref 70–99)
Glucose-Capillary: 114 mg/dL — ABNORMAL HIGH (ref 70–99)
Glucose-Capillary: 355 mg/dL — ABNORMAL HIGH (ref 70–99)

## 2019-03-24 LAB — LIPID PANEL
Cholesterol: 151 mg/dL (ref 0–200)
HDL: 38 mg/dL — ABNORMAL LOW (ref >40)
LDL Cholesterol: 95 mg/dL (ref 0–99)
Total CHOL/HDL Ratio: 4 RATIO
Triglycerides: 92 mg/dL (ref <150)
VLDL: 18 mg/dL (ref 0–40)

## 2019-03-24 LAB — PROTEIN, URINE, 24 HOUR
Collection Interval-UPROT: 24 hours
Protein, 24H Urine: 900 mg/d — ABNORMAL HIGH (ref 50–100)
Protein, Urine: 18 mg/dL
Urine Total Volume-UPROT: 5000 mL

## 2019-03-24 LAB — POTASSIUM: Potassium: 3.5 mmol/L (ref 3.5–5.1)

## 2019-03-24 LAB — MAGNESIUM: Magnesium: 1.2 mg/dL — ABNORMAL LOW (ref 1.7–2.4)

## 2019-03-24 MED ORDER — SPIRONOLACTONE 25 MG PO TABS: 25.0000 mg | ORAL_TABLET | Freq: Every day | ORAL | Status: DC

## 2019-03-24 MED ORDER — MAGNESIUM SULFATE 2 GM/50ML IV SOLN
2.0000 g | Freq: Once | INTRAVENOUS | Status: AC
  Administered 2019-03-24: 2 g via INTRAVENOUS
  Filled 2019-03-24: qty 50

## 2019-03-24 MED ORDER — INSULIN GLARGINE 100 UNIT/ML ~~LOC~~ SOLN
8.0000 [IU] | Freq: Once | SUBCUTANEOUS | Status: DC
  Filled 2019-03-24: qty 0.08

## 2019-03-24 MED ORDER — INSULIN GLARGINE 100 UNIT/ML ~~LOC~~ SOLN
20.0000 [IU] | Freq: Every day | SUBCUTANEOUS | Status: DC
  Filled 2019-03-24: qty 0.2

## 2019-03-24 MED ORDER — TRAMADOL HCL 50 MG PO TABS
50.0000 mg | ORAL_TABLET | Freq: Once | ORAL | Status: AC
  Administered 2019-03-24: 50 mg via ORAL

## 2019-03-24 MED ORDER — TRAMADOL HCL 50 MG PO TABS
ORAL_TABLET | ORAL | Status: AC
  Filled 2019-03-24: qty 1

## 2019-03-24 NOTE — Progress Notes (Signed)
Patient c/o cramping in her left leg; anterior shin and posterior popliteal.  Patient requests hot packs for comfort.  Dr. Lonny Prude notified.  Lab orders for K+ and Mg++ added.  Will await results.  Patient also requesting multiple packs of crackers with peanut butter.  RN explains that she needs to limit sodium intake.  Dietician, Gustavus Bryant, notified and states she will have an associate place orders for carb modified, low sodium snacks appropriate for patient.

## 2019-03-24 NOTE — Progress Notes (Signed)
PROGRESS NOTE    Cheryl Mercer  DQQ:[id number] DOB: 1986/04/17 DOA: 03/22/2019 PCP: Lorelee Market, MD   Brief Narrative: Cheryl Mercer is a 33 y.o. female with a history of hypertension, diabetes mellitus, remote cocaine use. She presents secondary to dyspnea on exertion and orthopnea and found to have acute systolic heart failure.   Assessment & Plan:   Principal Problem:   Acute combined systolic and diastolic congestive heart failure (HCC) Active Problems:   Uncontrolled type 2 diabetes mellitus with hyperglycemia (HCC)   Peripheral edema   Cardiomegaly   Proteinuria   Essential hypertension   Acute combined systolic and diastolic heart failure New diagnosis.  Possible secondary to uncontrolled hypertension diabetes mellitus.  Heart failure is consult for heart cath on 8/7.  Continuing aggressive IV diuresis.  Started on Entresto and spironolactone. -Continue Lasix IV per heart failure recommendations -Daily weights and outs  Diabetes mellitus, type II Uncontrolled with hyperglycemia.  Hemoglobin A1c of 13.9%.  Patient was on metformin therapy as an outpatient.  Unfortunately we will start insulin therapy and her.  Will likely benefit from SGLT 2 inhibitor on discharge secondary to heart disease. -Continue sliding scale insulin -Increase to Lantus 20 units daily  Essential hypertension Associated hypertensive urgency. -Entresto, spironolactone -Hydralazine as needed  Proteinuria In setting of diabetes mellitus.  Associated hypoalbuminemia.  24-hour protein collection started on 8/5 -Follow-up 24-hour protein   DVT prophylaxis: Lovenox Code Status:   Code Status: Full Code Family Communication: None at bedside Disposition Plan: Discharge pending continued cardiac work-up   Consultants:   Cardiology  Procedures:   Transthoracic Echocardiogram (03/23/2019) IMPRESSIONS    1. The left ventricle has a visually estimated ejection fraction  of 10-15%%. The cavity size was severely dilated. Left ventricular diastolic Doppler parameters are consistent with restrictive filling.  2. The right ventricle has moderately reduced systolic function. The cavity was normal. There is no increase in right ventricular wall thickness. Right ventricular systolic pressure is mildly elevated with an estimated pressure of 33.7 mmHg.  3. Left atrial size was moderately dilated.  4. Right atrial size was mildly dilated.  5. No evidence of mitral valve stenosis.  6. No stenosis of the aortic valve.  7. The aorta is normal in size and structure.  8. The aortic root and ascending aorta are normal in size and structure.  9. The inferior vena cava was dilated in size with <50% respiratory variability.  FINDINGS  Left Ventricle: The left ventricle has a visually estimated ejection fraction of 10-15%. The cavity size was severely dilated. There is no increase in left ventricular wall thickness. Left ventricular diastolic Doppler parameters are consistent with  restrictive filling.  Right Ventricle: The right ventricle has moderately reduced systolic function. The cavity was normal. There is no increase in right ventricular wall thickness. Right ventricular systolic pressure is mildly elevated with an estimated pressure of 33.7  mmHg.  Left Atrium: Left atrial size was moderately dilated.  Right Atrium: Right atrial size was mildly dilated. Right atrial pressure is estimated at 15 mmHg.  Interatrial Septum: No atrial level shunt detected by color flow Doppler.  Pericardium: There is no evidence of pericardial effusion.  Mitral Valve: The mitral valve is normal in structure. Mitral valve regurgitation is trivial by color flow Doppler. No evidence of mitral valve stenosis.  Tricuspid Valve: The tricuspid valve is normal in structure. Tricuspid valve regurgitation was not visualized by color flow Doppler.  Aortic Valve: The aortic valve is normal  in structure. Aortic valve regurgitation was not visualized by color flow Doppler. There is No stenosis of the aortic valve, with a calculated valve area of 2.27 cm.  Pulmonic Valve: The pulmonic valve was grossly normal. Pulmonic valve regurgitation is trivial by color flow Doppler.  Aorta: The aortic root and ascending aorta are normal in size and structure. The aorta is normal in size and structure.  Venous: The inferior vena cava is dilated in size with less than 50% respiratory variability.   Antimicrobials:  None   Subjective: Patient reports continued dyspnea when lying down flat.  She has some dyspnea when up and moving.  Symptoms have improved but she is not at baseline and was not able to sleep overnight secondary to her symptoms.  Objective: Vitals:   03/24/19 0318 03/24/19 0800 03/24/19 0817 03/24/19 1043  BP: (!) 141/110  (!) 156/111 (!) 148/105  Pulse: (!) 109  100   Resp: 13  11   Temp: 97.9 F (36.6 C)  98.5 F (36.9 C)   TempSrc: Oral  Oral   SpO2: 99% 94% 100%   Weight: 71 kg     Height:        Intake/Output Summary (Last 24 hours) at 03/24/2019 1134 Last data filed at 03/24/2019 [id number] Gross per 24 hour  Intake -  Output 2675 ml  Net -2675 ml   Filed Weights   03/23/19 0046 03/23/19 0241 03/24/19 0318  Weight: 86.2 kg 72.8 kg 71 kg    Examination:  General exam: Appears calm and comfortable Respiratory system: Diminished. respiratory effort normal. Cardiovascular system: S1 & S2 heard, tachycardia, regular rhythm. No murmurs, rubs, gallops or clicks. Gastrointestinal system: Abdomen is nondistended, soft and nontender. No organomegaly or masses felt. Normal bowel sounds heard. Central nervous system: Alert and oriented. No focal neurological deficits. Extremities: 2+ non-pitting edema. No calf tenderness. Tenderness over right anterior leg Skin: No cyanosis. No rashes Psychiatry: Judgement and insight appear normal. Mood & affect appropriate.     Data Reviewed: I have personally reviewed following labs and imaging studies  CBC: Recent Labs  Lab 03/22/19 1659  WBC 6.9  HGB 12.8  HCT 39.7  MCV 90.4  PLT 292   Basic Metabolic Panel: Recent Labs  Lab 03/22/19 1659 03/23/19 0058 03/23/19 0734 03/24/19 0218  NA 132* 135 140 134*  K 3.6 3.5 3.3* 3.5  CL 98 100 100 95*  CO2 22 24 29 27   GLUCOSE 530* 439* 93 326*  BUN <5* <5* 6 8  CREATININE 0.71 0.69 0.65 0.62  CALCIUM 8.4* 8.3* 8.6* 8.3*   GFR: Estimated Creatinine Clearance: 91.8 mL/min (by C-G formula based on SCr of 0.62 mg/dL). Liver Function Tests: Recent Labs  Lab 03/23/19 0053  AST 41  ALT 19  ALKPHOS 122  BILITOT 0.8  PROT 6.0*  ALBUMIN 2.8*   No results for input(s): LIPASE, AMYLASE in the last 168 hours. No results for input(s): AMMONIA in the last 168 hours. Coagulation Profile: No results for input(s): INR, PROTIME in the last 168 hours. Cardiac Enzymes: No results for input(s): CKTOTAL, CKMB, CKMBINDEX, TROPONINI in the last 168 hours. BNP (last 3 results) No results for input(s): PROBNP in the last 8760 hours. HbA1C: Recent Labs    03/23/19 0916  HGBA1C 13.9*   CBG: Recent Labs  Lab 03/23/19 0826 03/23/19 1154 03/23/19 1655 03/23/19 2129 03/24/19 0608  GLUCAP 100* 361* 256* 174* 290*   Lipid Profile: Recent Labs    03/24/19 0218  CHOL  151  HDL 38*  LDLCALC 95  TRIG 92  CHOLHDL 4.0   Thyroid Function Tests: No results for input(s): TSH, T4TOTAL, FREET4, T3FREE, THYROIDAB in the last 72 hours. Anemia Panel: No results for input(s): VITAMINB12, FOLATE, FERRITIN, TIBC, IRON, RETICCTPCT in the last 72 hours. Sepsis Labs: No results for input(s): PROCALCITON, LATICACIDVEN in the last 168 hours.  Recent Results (from the past 240 hour(s))  SARS CORONAVIRUS 2 Nasal Swab Aptima Multi Swab     Status: None   Collection Time: 03/23/19 12:36 AM   Specimen: Aptima Multi Swab; Nasal Swab  Result Value Ref Range Status   SARS  Coronavirus 2 NEGATIVE NEGATIVE Final    Comment: (NOTE) SARS-CoV-2 target nucleic acids are NOT DETECTED. The SARS-CoV-2 RNA is generally detectable in upper and lower respiratory specimens during the acute phase of infection. Negative results do not preclude SARS-CoV-2 infection, do not rule out co-infections with other pathogens, and should not be used as the sole basis for treatment or other patient management decisions. Negative results must be combined with clinical observations, patient history, and epidemiological information. The expected result is Negative. Fact Sheet for Patients: HairSlick.nohttps://www.fda.gov/media/138098/download Fact Sheet for Healthcare Providers: quierodirigir.comhttps://www.fda.gov/media/138095/download This test is not yet approved or cleared by the [place] FDA and  has been authorized for detection and/or diagnosis of SARS-CoV-2 by FDA under an Emergency Use Authorization (EUA). This EUA will remain  in effect (meaning this test can be used) for the duration of the COVID-19 declaration under Section 56 4(b)(1) of the Act, 21 U.S.C. section 360bbb-3(b)(1), unless the authorization is terminated or revoked sooner. Performed at [facility], [address] [place], [place] [address]   MRSA PCR Screening     Status: None   Collection Time: 03/23/19  2:44 AM   Specimen: Nasal Mucosa; Nasopharyngeal  Result Value Ref Range Status   MRSA by PCR NEGATIVE NEGATIVE Final    Comment:        The GeneXpert MRSA Assay (FDA approved for NASAL specimens only), is one component of a comprehensive MRSA colonization surveillance program. It is not intended to diagnose MRSA infection nor to guide or monitor treatment for MRSA infections. Performed at [facility], [address] [place], [place] [address]          Radiology Studies: Dg Chest 2 View  Result Date: 03/22/2019 CLINICAL DATA:  33 year old female with history of shortness of breath with central  and left-sided chest pain. EXAM: CHEST - 2 VIEW COMPARISON:  Chest x-ray 02/04/2019. FINDINGS: Lung volumes are normal. No consolidative airspace disease. No pleural effusions. No pneumothorax. No pulmonary nodule or mass noted. Pulmonary vasculature and the cardiomediastinal silhouette are within normal limits. IMPRESSION: No radiographic evidence of acute cardiopulmonary disease. Electronically Signed   By: Trudie Reedaniel  Entrikin M.D.   On: 03/22/2019 17:34   Ct Angio Chest Pe W And/or Wo Contrast  Result Date: 03/22/2019 CLINICAL DATA:  Chest pain, ankle swelling EXAM: CT ANGIOGRAPHY CHEST WITH CONTRAST TECHNIQUE: Multidetector CT imaging of the chest was performed using the standard protocol during bolus administration of intravenous contrast. Multiplanar CT image reconstructions and MIPs were obtained to evaluate the vascular anatomy. CONTRAST:  80mL OMNIPAQUE IOHEXOL 350 MG/ML SOLN COMPARISON:  Chest x-ray earlier today FINDINGS: Cardiovascular: No filling defects in the pulmonary arteries to suggestpulmonary emboli. Cardiomegaly. No evidence of aortic aneurysm. Mediastinum/Nodes: No mediastinal, hilar, or axillary adenopathy. Trachea and esophagus are unremarkable. Thyroid unremarkable. Lungs/Pleura: Lungs are clear. No focal airspace  opacities or suspicious nodules. No effusions. Upper Abdomen: Imaging into the upper abdomen shows no acute findings. Musculoskeletal: Chest wall soft tissues are unremarkable. No acute bony abnormality. Review of the MIP images confirms the above findings. IMPRESSION: No evidence of pulmonary embolus. Cardiomegaly.  No acute cardiopulmonary disease. Electronically Signed   By: Charlett Nose M.D.   On: 03/22/2019 23:12   Dg Knee Complete 4 Views Right  Result Date: 03/22/2019 CLINICAL DATA:  Knee pain and swelling, no known injury, initial encounter EXAM: RIGHT KNEE - COMPLETE 4+ VIEW COMPARISON:  None. FINDINGS: Generalized soft tissue swelling is noted. No acute fracture or  dislocation is seen. No joint effusion is seen. No other focal abnormality is noted. IMPRESSION: Soft tissue swelling without acute bony abnormality. Electronically Signed   By: Alcide Clever M.D.   On: 03/22/2019 23:18        Scheduled Meds: . digoxin  0.125 mg Oral Daily  . enoxaparin (LOVENOX) injection  40 mg Subcutaneous Q24H  . furosemide  40 mg Intravenous BID  . insulin aspart  0-15 Units Subcutaneous TID WC  . insulin aspart  4 Units Subcutaneous TID WC  . insulin glargine  12 Units Subcutaneous Daily  . potassium chloride  20 mEq Oral BID  . sacubitril-valsartan  1 tablet Oral BID  . [START ON 03/25/2019] spironolactone  25 mg Oral Daily   Continuous Infusions:   LOS: 1 day     Jacquelin Hawking, MD [facility] 03/24/2019, 11:34 AM  If 7PM-7AM, please contact night-coverage [email or web address]

## 2019-03-24 NOTE — Progress Notes (Signed)
Advanced Heart Failure Rounding Note   Subjective:    Diuresing well weight down 4 pounds. Breathing better but still DOE with ambulating room and mild orthopnea. + HA. BP running high. No CP today    Objective:   Weight Range:  Vital Signs:   Temp:  [97.6 F (36.4 C)-98.5 F (36.9 C)] 98.5 F (36.9 C) (08/06 0817) Pulse Rate:  [97-115] 100 (08/06 0817) Resp:  [11-28] 11 (08/06 0817) BP: (135-156)/(94-119) 156/111 (08/06 0817) SpO2:  [94 %-100 %] 100 % (08/06 0817) Weight:  [71 kg] 71 kg (08/06 0318) Last BM Date: 03/22/19  Weight change: Filed Weights   03/23/19 0046 03/23/19 0241 03/24/19 0318  Weight: 86.2 kg 72.8 kg 71 kg    Intake/Output:   Intake/Output Summary (Last 24 hours) at 03/24/2019 0947 Last data filed at 03/24/2019 0909 Gross per 24 hour  Intake -  Output 3175 ml  Net -3175 ml     Physical Exam: General:  Sitting up on side of bed. No resp difficulty HEENT: normal Neck: supple. JVP 9 . Carotids 2+ bilat; no bruits. No lymphadenopathy or thryomegaly appreciated. Cor: PMI nondisplaced. Tachy regular +s3 Lungs: clear Abdomen: soft, nontender, nondistended. No hepatosplenomegaly. No bruits or masses. Good bowel sounds. Extremities: no cyanosis, clubbing, rash, 1-2+ edema Neuro: alert & orientedx3, cranial nerves grossly intact. moves all 4 extremities w/o difficulty. Affect pleasant  Telemetry: Stach 100-115 Personally reviewed   Labs: Basic Metabolic Panel: Recent Labs  Lab 03/22/19 1659 03/23/19 0058 03/23/19 0734 03/24/19 0218  NA 132* 135 140 134*  K 3.6 3.5 3.3* 3.5  CL 98 100 100 95*  CO2 22 24 29 27   GLUCOSE 530* 439* 93 326*  BUN <5* <5* 6 8  CREATININE 0.71 0.69 0.65 0.62  CALCIUM 8.4* 8.3* 8.6* 8.3*    Liver Function Tests: Recent Labs  Lab 03/23/19 0053  AST 41  ALT 19  ALKPHOS 122  BILITOT 0.8  PROT 6.0*  ALBUMIN 2.8*   No results for input(s): LIPASE, AMYLASE in the last 168 hours. No results for input(s):  AMMONIA in the last 168 hours.  CBC: Recent Labs  Lab 03/22/19 1659  WBC 6.9  HGB 12.8  HCT 39.7  MCV 90.4  PLT 292    Cardiac Enzymes: No results for input(s): CKTOTAL, CKMB, CKMBINDEX, TROPONINI in the last 168 hours.  BNP: BNP (last 3 results) Recent Labs    02/04/19 1741 03/22/19 2154  BNP 1,418.4* 962.3*    ProBNP (last 3 results) No results for input(s): PROBNP in the last 8760 hours.    Other results:  Imaging: Dg Chest 2 View  Result Date: 03/22/2019 CLINICAL DATA:  33 year old female with history of shortness of breath with central and left-sided chest pain. EXAM: CHEST - 2 VIEW COMPARISON:  Chest x-ray 02/04/2019. FINDINGS: Lung volumes are normal. No consolidative airspace disease. No pleural effusions. No pneumothorax. No pulmonary nodule or mass noted. Pulmonary vasculature and the cardiomediastinal silhouette are within normal limits. IMPRESSION: No radiographic evidence of acute cardiopulmonary disease. Electronically Signed   By: Vinnie Langton M.D.   On: 03/22/2019 17:34   Ct Angio Chest Pe W And/or Wo Contrast  Result Date: 03/22/2019 CLINICAL DATA:  Chest pain, ankle swelling EXAM: CT ANGIOGRAPHY CHEST WITH CONTRAST TECHNIQUE: Multidetector CT imaging of the chest was performed using the standard protocol during bolus administration of intravenous contrast. Multiplanar CT image reconstructions and MIPs were obtained to evaluate the vascular anatomy. CONTRAST:  1mL OMNIPAQUE IOHEXOL  350 MG/ML SOLN COMPARISON:  Chest x-ray earlier today FINDINGS: Cardiovascular: No filling defects in the pulmonary arteries to suggestpulmonary emboli. Cardiomegaly. No evidence of aortic aneurysm. Mediastinum/Nodes: No mediastinal, hilar, or axillary adenopathy. Trachea and esophagus are unremarkable. Thyroid unremarkable. Lungs/Pleura: Lungs are clear. No focal airspace opacities or suspicious nodules. No effusions. Upper Abdomen: Imaging into the upper abdomen shows no acute  findings. Musculoskeletal: Chest wall soft tissues are unremarkable. No acute bony abnormality. Review of the MIP images confirms the above findings. IMPRESSION: No evidence of pulmonary embolus. Cardiomegaly.  No acute cardiopulmonary disease. Electronically Signed   By: Charlett Nose M.D.   On: 03/22/2019 23:12   Dg Knee Complete 4 Views Right  Result Date: 03/22/2019 CLINICAL DATA:  Knee pain and swelling, no known injury, initial encounter EXAM: RIGHT KNEE - COMPLETE 4+ VIEW COMPARISON:  None. FINDINGS: Generalized soft tissue swelling is noted. No acute fracture or dislocation is seen. No joint effusion is seen. No other focal abnormality is noted. IMPRESSION: Soft tissue swelling without acute bony abnormality. Electronically Signed   By: Alcide Clever M.D.   On: 03/22/2019 23:18      Medications:     Scheduled Medications: . digoxin  0.125 mg Oral Daily  . enoxaparin (LOVENOX) injection  40 mg Subcutaneous Q24H  . furosemide  40 mg Intravenous BID  . insulin aspart  0-15 Units Subcutaneous TID WC  . insulin aspart  4 Units Subcutaneous TID WC  . insulin glargine  12 Units Subcutaneous Daily  . potassium chloride  20 mEq Oral BID  . sacubitril-valsartan  1 tablet Oral BID  . spironolactone  12.5 mg Oral Daily     Infusions:   PRN Medications:  acetaminophen **OR** acetaminophen, hydrALAZINE, ondansetron **OR** ondansetron (ZOFRAN) IV   Assessment/Plan:   1. Acute systolic HF  - Echo showed severely dilated LV with EF of 10-15% and grade 3 diastolic dysfunction. RV systolic function also noted to be moderately reduced - Remains volume overloaded and hypertensive - Entresto started this am. Continue digoxin. Increase spiro to 25. 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 Plan cath tomorrow. If no significant CAD will need cMRI to look for possible remote myocarditis.   2. Chest pain - troponin  negative - strong Fhx for premature CAD - plan cath tomorrow  3. HTN - poorly controlled. titrate Entresto and spiro   4. DM2 - poorly controlled. Primary team managing - eventually will need SGLT2i  5. Hypokalemia - supp  Length of Stay: 1   Arvilla Meres, MD 03/24/2019, 9:47 AM  Advanced Heart Failure Team Pager [phone] (M-F; 7a - 4p)  Please contact [facility] for night-coverage after hours (4p -7a ) and weekends on [email or web address]

## 2019-03-24 NOTE — Progress Notes (Addendum)
Inpatient Diabetes Program Recommendations  AACE/ADA: New Consensus Statement on Inpatient Glycemic Control (2015)  Target Ranges:  Prepandial:   less than 140 mg/dL      Peak postprandial:   less than 180 mg/dL (1-2 hours)      Critically ill patients:  140 - 180 mg/dL   Lab Results  Component Value Date   GLUCAP 290 (H) 03/24/2019   HGBA1C 13.9 (H) 03/23/2019    Review of Glycemic Control Results for Cheryl Mercer, Cheryl Mercer (MRN [id number]) as of 03/24/2019 10:00  Ref. Range 03/23/2019 11:54 03/23/2019 16:55 03/23/2019 21:29 03/24/2019 06:08  Glucose-Capillary Latest Ref Range: 70 - 99 mg/dL 361 (H) 256 (H) 174 (H) 290 (H)   Diabetes history: Type 2 DM Outpatient Diabetes medications: metformin 500 mg BID Current orders for Inpatient glycemic control: Novolog 0-15 units TID, Lantus 12 units QD, Novolog 4 units TID  Inpatient Diabetes Program Recommendations:    Consider further increasing Lantus to 20 units QD.  Addendum@ 1700: Spoke with patient again regarding DM. Patient had been reviewing LWWDM booklet and is asking appropriate questions. "I am learning that this is my norm."  Reviewed role of pancreas, need for insulin, role of insulin, hypo vs hyper glycemia, differences between long acting insulin vs short acting insulin, interventions, when to administer and current insulin dosages, frequency of blood sugar checks, when to call the MD, the importance of following up to gain better control, carb counting, being mindful of carb intake, reading nutritional labels, and role of endocrinology, Attached endo list to DC summary.  Patient already thinking about how DM will fit into life at home, journaling blood sugars and food intake. Has no further questions at this time. Educated patient on insulin pen use at home. Reviewed contents of insulin flexpen starter kit. Reviewed all steps if insulin pen including attachment of needle, 2-unit air shot, dialing up dose, giving injection, removing  needle, disposal of sharps, storage of unused insulin, disposal of insulin etc. Patient able to provide successful return demonstration. Also reviewed troubleshooting with insulin pen. MD to give patient Rxs for insulin pens and insulin pen needles.     Thanks, Bronson Curb, MSN, RNC-OB Diabetes Coordinator [phone] (8a-5p)

## 2019-03-24 NOTE — Plan of Care (Signed)
  Problem: Education: Goal: Ability to describe self-care measures that may prevent or decrease complications (Diabetes Survival Skills Education) will improve Outcome: Progressing   Problem: Cardiac: Goal: Ability to maintain an adequate cardiac output will improve Outcome: Progressing   Problem: Health Behavior/Discharge Planning: Goal: Ability to identify and utilize available resources and services will improve Outcome: Progressing Goal: Ability to manage health-related needs will improve Outcome: Progressing   Problem: Fluid Volume: Goal: Ability to achieve a balanced intake and output will improve Outcome: Progressing   Problem: Metabolic: Goal: Ability to maintain appropriate glucose levels will improve Outcome: Progressing

## 2019-03-25 ENCOUNTER — Inpatient Hospital Stay (HOSPITAL_COMMUNITY): Payer: Medicaid Other

## 2019-03-25 ENCOUNTER — Encounter (HOSPITAL_COMMUNITY)
Admission: EM | Disposition: A | Discharge status: Home / Self Care | Payer: Self-pay | Source: Home / Self Care | Attending: Family Medicine

## 2019-03-25 ENCOUNTER — Encounter (HOSPITAL_COMMUNITY): Payer: Self-pay | Admitting: Internal Medicine

## 2019-03-25 DIAGNOSIS — E119 Type 2 diabetes mellitus without complications: Secondary | ICD-10-CM

## 2019-03-25 DIAGNOSIS — I5041 Acute combined systolic (congestive) and diastolic (congestive) heart failure: Secondary | ICD-10-CM

## 2019-03-25 DIAGNOSIS — I10 Essential (primary) hypertension: Secondary | ICD-10-CM

## 2019-03-25 HISTORY — PX: RIGHT/LEFT HEART CATH AND CORONARY ANGIOGRAPHY: CATH118266

## 2019-03-25 LAB — BASIC METABOLIC PANEL
Anion gap: 13 (ref 5–15)
BUN: 7 mg/dL (ref 6–20)
CO2: 26 mmol/L (ref 22–32)
Calcium: 8.7 mg/dL — ABNORMAL LOW (ref 8.9–10.3)
Chloride: 95 mmol/L — ABNORMAL LOW (ref 98–111)
Creatinine, Ser: 0.63 mg/dL (ref 0.44–1.00)
GFR calc Af Amer: >60 mL/min (ref >60)
GFR calc non Af Amer: >60 mL/min (ref >60)
Glucose, Bld: 435 mg/dL — ABNORMAL HIGH (ref 70–99)
Potassium: 3.6 mmol/L (ref 3.5–5.1)
Sodium: 134 mmol/L — ABNORMAL LOW (ref 135–145)

## 2019-03-25 LAB — POCT I-STAT 7, (LYTES, BLD GAS, ICA,H+H)
Bicarbonate: 24.5 mmol/L (ref 20.0–28.0)
Calcium, Ion: 1.12 mmol/L — ABNORMAL LOW (ref 1.15–1.40)
HCT: 41 % (ref 36.0–46.0)
Hemoglobin: 13.9 g/dL (ref 12.0–15.0)
O2 Saturation: 99 %
Potassium: 3.4 mmol/L — ABNORMAL LOW (ref 3.5–5.1)
Sodium: 144 mmol/L (ref 135–145)
TCO2: 26 mmol/L (ref 22–32)
pCO2 arterial: 39.8 mmHg (ref 32.0–48.0)
pH, Arterial: 7.397 (ref 7.350–7.450)
pO2, Arterial: 125 mmHg — ABNORMAL HIGH (ref 83.0–108.0)

## 2019-03-25 LAB — POCT I-STAT EG7
Acid-Base Excess: 2 mmol/L (ref 0.0–2.0)
Bicarbonate: 26.5 mmol/L (ref 20.0–28.0)
Bicarbonate: 28.2 mmol/L — ABNORMAL HIGH (ref 20.0–28.0)
Calcium, Ion: 1.19 mmol/L (ref 1.15–1.40)
Calcium, Ion: 1.24 mmol/L (ref 1.15–1.40)
HCT: 42 % (ref 36.0–46.0)
HCT: 43 % (ref 36.0–46.0)
Hemoglobin: 14.3 g/dL (ref 12.0–15.0)
Hemoglobin: 14.6 g/dL (ref 12.0–15.0)
O2 Saturation: 74 %
O2 Saturation: 76 %
Potassium: 3.5 mmol/L (ref 3.5–5.1)
Potassium: 3.7 mmol/L (ref 3.5–5.1)
Sodium: 141 mmol/L (ref 135–145)
Sodium: 142 mmol/L (ref 135–145)
TCO2: 28 mmol/L (ref 22–32)
TCO2: 30 mmol/L (ref 22–32)
pCO2, Ven: 47.3 mmHg (ref 44.0–60.0)
pCO2, Ven: 49.7 mmHg (ref 44.0–60.0)
pH, Ven: 7.356 (ref 7.250–7.430)
pH, Ven: 7.361 (ref 7.250–7.430)
pO2, Ven: 42 mmHg (ref 32.0–45.0)
pO2, Ven: 43 mmHg (ref 32.0–45.0)

## 2019-03-25 LAB — CREATININE, SERUM
Creatinine, Ser: 0.66 mg/dL (ref 0.44–1.00)
GFR calc Af Amer: >60 mL/min (ref >60)
GFR calc non Af Amer: >60 mL/min (ref >60)

## 2019-03-25 LAB — CBC
HCT: 47.2 % — ABNORMAL HIGH (ref 36.0–46.0)
Hemoglobin: 15.6 g/dL — ABNORMAL HIGH (ref 12.0–15.0)
MCH: 29.5 pg (ref 26.0–34.0)
MCHC: 33.1 g/dL (ref 30.0–36.0)
MCV: 89.2 fL (ref 80.0–100.0)
Platelets: 387 10*3/uL (ref 150–400)
RBC: 5.29 MIL/uL — ABNORMAL HIGH (ref 3.87–5.11)
RDW: 13.2 % (ref 11.5–15.5)
WBC: 9.9 10*3/uL (ref 4.0–10.5)
nRBC: 0 % (ref 0.0–0.2)

## 2019-03-25 LAB — GLUCOSE, CAPILLARY
Glucose-Capillary: 397 mg/dL — ABNORMAL HIGH (ref 70–99)
Glucose-Capillary: 172 mg/dL — ABNORMAL HIGH (ref 70–99)
Glucose-Capillary: 79 mg/dL (ref 70–99)
Glucose-Capillary: 117 mg/dL — ABNORMAL HIGH (ref 70–99)
Glucose-Capillary: 160 mg/dL — ABNORMAL HIGH (ref 70–99)
Glucose-Capillary: 379 mg/dL — ABNORMAL HIGH (ref 70–99)
Glucose-Capillary: 531 mg/dL (ref 70–99)
Glucose-Capillary: 451 mg/dL — ABNORMAL HIGH (ref 70–99)
Glucose-Capillary: 529 mg/dL (ref 70–99)

## 2019-03-25 LAB — MAGNESIUM: Magnesium: 1.7 mg/dL (ref 1.7–2.4)

## 2019-03-25 SURGERY — RIGHT/LEFT HEART CATH AND CORONARY ANGIOGRAPHY
Anesthesia: LOCAL

## 2019-03-25 MED ORDER — DIGOXIN 125 MCG PO TABS
0.1250 mg | ORAL_TABLET | Freq: Every day | ORAL | Status: DC
  Administered 2019-03-25 – 2019-03-28 (×4): 0.125 mg via ORAL
  Filled 2019-03-25 (×4): qty 1

## 2019-03-25 MED ORDER — MIDAZOLAM HCL 2 MG/2ML IJ SOLN
INTRAMUSCULAR | Status: DC | PRN
  Administered 2019-03-25: 1 mg via INTRAVENOUS

## 2019-03-25 MED ORDER — FENTANYL CITRATE (PF) 100 MCG/2ML IJ SOLN
25.0000 ug | Freq: Once | INTRAMUSCULAR | Status: AC
  Administered 2019-03-25: 25 ug via INTRAVENOUS

## 2019-03-25 MED ORDER — HEPARIN SODIUM (PORCINE) 1000 UNIT/ML IJ SOLN
INTRAMUSCULAR | Status: DC | PRN
  Administered 2019-03-25: 3500 [IU] via INTRAVENOUS

## 2019-03-25 MED ORDER — SPIRONOLACTONE 25 MG PO TABS
25.0000 mg | ORAL_TABLET | Freq: Every day | ORAL | Status: DC
  Administered 2019-03-25 – 2019-03-28 (×4): 25 mg via ORAL
  Filled 2019-03-25 (×4): qty 1

## 2019-03-25 MED ORDER — SODIUM CHLORIDE 0.9 % IV SOLN: 250.0000 mL | INTRAVENOUS | Status: DC | PRN

## 2019-03-25 MED ORDER — VERAPAMIL HCL 2.5 MG/ML IV SOLN
INTRAVENOUS | Status: AC
  Filled 2019-03-25: qty 2

## 2019-03-25 MED ORDER — LIDOCAINE HCL (PF) 1 % IJ SOLN
INTRAMUSCULAR | Status: AC
  Filled 2019-03-25: qty 30

## 2019-03-25 MED ORDER — SODIUM CHLORIDE 0.9% FLUSH: 3.0000 mL | INTRAVENOUS | Status: DC | PRN

## 2019-03-25 MED ORDER — MIDAZOLAM HCL 2 MG/2ML IJ SOLN
INTRAMUSCULAR | Status: AC
  Filled 2019-03-25: qty 2

## 2019-03-25 MED ORDER — INSULIN ASPART 100 UNIT/ML ~~LOC~~ SOLN
0.0000 [IU] | Freq: Three times a day (TID) | SUBCUTANEOUS | Status: DC
  Administered 2019-03-25: 15 [IU] via SUBCUTANEOUS
  Administered 2019-03-26 (×3): 8 [IU] via SUBCUTANEOUS
  Administered 2019-03-27: 07:00:00 11 [IU] via SUBCUTANEOUS
  Administered 2019-03-27: 12:00:00 3 [IU] via SUBCUTANEOUS
  Administered 2019-03-27: 17:00:00 8 [IU] via SUBCUTANEOUS
  Administered 2019-03-28 (×2): 11 [IU] via SUBCUTANEOUS

## 2019-03-25 MED ORDER — HEPARIN SODIUM (PORCINE) 1000 UNIT/ML IJ SOLN
INTRAMUSCULAR | Status: AC
  Filled 2019-03-25: qty 1

## 2019-03-25 MED ORDER — IOHEXOL 350 MG/ML SOLN
INTRAVENOUS | Status: DC | PRN
  Administered 2019-03-25: 40 mL via INTRA_ARTERIAL

## 2019-03-25 MED ORDER — ACETAMINOPHEN 325 MG PO TABS
650.0000 mg | ORAL_TABLET | ORAL | Status: DC | PRN
  Administered 2019-03-25 (×2): 650 mg via ORAL
  Filled 2019-03-25 (×3): qty 2

## 2019-03-25 MED ORDER — DEXTROSE 50 % IV SOLN
INTRAVENOUS | Status: DC | PRN
  Administered 2019-03-25: 12.5 g via INTRAVENOUS

## 2019-03-25 MED ORDER — DEXTROSE 50 % IV SOLN
INTRAVENOUS | Status: AC
  Filled 2019-03-25: qty 50

## 2019-03-25 MED ORDER — LIDOCAINE HCL (PF) 1 % IJ SOLN
INTRAMUSCULAR | Status: DC | PRN
  Administered 2019-03-25: 5 mL

## 2019-03-25 MED ORDER — HYDRALAZINE HCL 20 MG/ML IJ SOLN: 10.0000 mg | INTRAMUSCULAR | Status: AC | PRN

## 2019-03-25 MED ORDER — FENTANYL CITRATE (PF) 100 MCG/2ML IJ SOLN
INTRAMUSCULAR | Status: DC | PRN
  Administered 2019-03-25: 25 ug via INTRAVENOUS

## 2019-03-25 MED ORDER — POTASSIUM CHLORIDE CRYS ER 20 MEQ PO TBCR
20.0000 meq | EXTENDED_RELEASE_TABLET | Freq: Two times a day (BID) | ORAL | Status: DC
  Administered 2019-03-25 – 2019-03-26 (×3): 20 meq via ORAL
  Filled 2019-03-25 (×3): qty 1

## 2019-03-25 MED ORDER — LABETALOL HCL 5 MG/ML IV SOLN: 10.0000 mg | INTRAVENOUS | Status: AC | PRN

## 2019-03-25 MED ORDER — SODIUM CHLORIDE 0.9 % IV SOLN
INTRAVENOUS | Status: AC
  Administered 2019-03-25: 11:00:00 via INTRAVENOUS

## 2019-03-25 MED ORDER — HEPARIN (PORCINE) IN NACL 1000-0.9 UT/500ML-% IV SOLN
INTRAVENOUS | Status: DC | PRN
  Administered 2019-03-25 (×2): 500 mL

## 2019-03-25 MED ORDER — FUROSEMIDE 10 MG/ML IJ SOLN
40.0000 mg | Freq: Two times a day (BID) | INTRAMUSCULAR | Status: DC
  Administered 2019-03-25 (×2): 40 mg via INTRAVENOUS
  Filled 2019-03-25 (×3): qty 4

## 2019-03-25 MED ORDER — MIDAZOLAM HCL 2 MG/2ML IJ SOLN
1.0000 mg | Freq: Once | INTRAMUSCULAR | Status: AC
  Administered 2019-03-25: 1 mg via INTRAVENOUS

## 2019-03-25 MED ORDER — HEPARIN (PORCINE) IN NACL 1000-0.9 UT/500ML-% IV SOLN
INTRAVENOUS | Status: AC
  Filled 2019-03-25: qty 1000

## 2019-03-25 MED ORDER — SODIUM CHLORIDE 0.9% FLUSH
3.0000 mL | INTRAVENOUS | Status: DC | PRN
  Administered 2019-03-25: 22:00:00 via INTRAVENOUS
  Filled 2019-03-25: qty 3

## 2019-03-25 MED ORDER — VERAPAMIL HCL 2.5 MG/ML IV SOLN
INTRAVENOUS | Status: DC | PRN
  Administered 2019-03-25: 10 mL via INTRA_ARTERIAL

## 2019-03-25 MED ORDER — SODIUM CHLORIDE 0.9% FLUSH
3.0000 mL | Freq: Two times a day (BID) | INTRAVENOUS | Status: DC
  Administered 2019-03-25 – 2019-03-28 (×7): 3 mL via INTRAVENOUS

## 2019-03-25 MED ORDER — FENTANYL CITRATE (PF) 100 MCG/2ML IJ SOLN
INTRAMUSCULAR | Status: AC
  Filled 2019-03-25: qty 2

## 2019-03-25 MED ORDER — ONDANSETRON HCL 4 MG/2ML IJ SOLN: 4.0000 mg | Freq: Four times a day (QID) | INTRAMUSCULAR | Status: DC | PRN

## 2019-03-25 MED ORDER — SACUBITRIL-VALSARTAN 49-51 MG PO TABS
1.0000 | ORAL_TABLET | Freq: Two times a day (BID) | ORAL | Status: DC
  Administered 2019-03-25 (×2): 1 via ORAL
  Filled 2019-03-25 (×3): qty 1

## 2019-03-25 MED ORDER — INSULIN GLARGINE 100 UNIT/ML ~~LOC~~ SOLN
20.0000 [IU] | Freq: Every day | SUBCUTANEOUS | Status: DC
  Administered 2019-03-25 – 2019-03-26 (×2): 20 [IU] via SUBCUTANEOUS
  Filled 2019-03-25 (×2): qty 0.2

## 2019-03-25 MED ORDER — MAGNESIUM SULFATE 2 GM/50ML IV SOLN
2.0000 g | Freq: Once | INTRAVENOUS | Status: AC
  Administered 2019-03-25: 13:00:00 2 g via INTRAVENOUS
  Filled 2019-03-25: qty 50

## 2019-03-25 MED ORDER — INSULIN ASPART 100 UNIT/ML ~~LOC~~ SOLN
0.0000 [IU] | Freq: Every day | SUBCUTANEOUS | Status: DC
  Administered 2019-03-25: 22:00:00 5 [IU] via SUBCUTANEOUS
  Administered 2019-03-26: 4 [IU] via SUBCUTANEOUS

## 2019-03-25 MED ORDER — GADOBUTROL 1 MMOL/ML IV SOLN
10.0000 mL | Freq: Once | INTRAVENOUS | Status: AC | PRN
  Administered 2019-03-25: 10 mL via INTRAVENOUS

## 2019-03-25 MED ORDER — INSULIN ASPART 100 UNIT/ML ~~LOC~~ SOLN
9.0000 [IU] | Freq: Once | SUBCUTANEOUS | Status: AC
  Administered 2019-03-25: 9 [IU] via SUBCUTANEOUS

## 2019-03-25 MED ORDER — SODIUM CHLORIDE 0.9% FLUSH
3.0000 mL | Freq: Two times a day (BID) | INTRAVENOUS | Status: DC
  Administered 2019-03-25 – 2019-03-26 (×2): 3 mL via INTRAVENOUS

## 2019-03-25 MED ORDER — SACUBITRIL-VALSARTAN 49-51 MG PO TABS
1.0000 | ORAL_TABLET | Freq: Two times a day (BID) | ORAL | Status: DC
  Filled 2019-03-25: qty 1

## 2019-03-25 MED ORDER — ENOXAPARIN SODIUM 40 MG/0.4ML ~~LOC~~ SOLN
40.0000 mg | SUBCUTANEOUS | Status: DC
  Administered 2019-03-26 – 2019-03-28 (×3): 40 mg via SUBCUTANEOUS
  Filled 2019-03-25 (×3): qty 0.4

## 2019-03-25 SURGICAL SUPPLY — 14 items
CATH 5FR JL3.5 JR4 ANG PIG MP (CATHETERS) ×1 IMPLANT
CATH BALLN WEDGE 5F 110CM (CATHETERS) ×1 IMPLANT
CATH LAUNCHER 5F JL3 (CATHETERS) IMPLANT
CATHETER LAUNCHER 5F JL3 (CATHETERS) ×2
DEVICE RAD TR BAND REGULAR (VASCULAR PRODUCTS) ×1 IMPLANT
GUIDEWIRE INQWIRE 1.5J.035X260 (WIRE) IMPLANT
INQWIRE 1.5J .035X260CM (WIRE) ×2
KIT HEART LEFT (KITS) ×1 IMPLANT
PACK CARDIAC CATHETERIZATION (CUSTOM PROCEDURE TRAY) ×2 IMPLANT
SHEATH GLIDE SLENDER 4/5FR (SHEATH) ×1 IMPLANT
SHEATH RAIN RADIAL 21G 6FR (SHEATH) ×1 IMPLANT
TRANSDUCER W/STOPCOCK (MISCELLANEOUS) ×2 IMPLANT
TUBING CIL FLEX 10 FLL-RA (TUBING) ×1 IMPLANT
WIRE EMERALD 3MM-J .025X260CM (WIRE) ×1 IMPLANT

## 2019-03-25 NOTE — Progress Notes (Signed)
Advanced Heart Failure Rounding Note   Subjective:    Continues to diurese well. Weight down another 8 pounds (14 pounds total) . BP still runnin ghigh. BS > 400 this am. No CP. Orthopnea improved.     Objective:   Weight Range:  Vital Signs:   Temp:  [97.6 F (36.4 C)-98.4 F (36.9 C)] 98.1 F (36.7 C) (08/07 0716) Pulse Rate:  [107-118] 110 (08/07 0716) Resp:  [8-13] 11 (08/07 0300) BP: (121-148)/(67-105) 142/100 (08/07 0300) SpO2:  [98 %-100 %] 98 % (08/07 0716) Weight:  [67.4 kg] 67.4 kg (08/07 0300) Last BM Date: 03/22/19  Weight change: Filed Weights   03/23/19 0241 03/24/19 0318 03/25/19 0300  Weight: 72.8 kg 71 kg 67.4 kg    Intake/Output:   Intake/Output Summary (Last 24 hours) at 03/25/2019 0901 Last data filed at 03/25/2019 0716 Gross per 24 hour  Intake 220 ml  Output 2600 ml  Net -2380 ml     Physical Exam: General:  Lying in bed. No resp difficulty HEENT: normal Neck: supple. JVP 7. Carotids 2+ bilat; no bruits. No lymphadenopathy or thryomegaly appreciated. Cor: PMI nondisplaced. Tachy reg + s3 Lungs: clear Abdomen: soft, nontender, nondistended. No hepatosplenomegaly. No bruits or masses. Good bowel sounds. Extremities: no cyanosis, clubbing, rash, tr edema Neuro: alert & orientedx3, cranial nerves grossly intact. moves all 4 extremities w/o difficulty. Affect pleasant   Telemetry: Stach 100-115 Personally reviewed   Labs: Basic Metabolic Panel: Recent Labs  Lab 03/22/19 1659 03/23/19 0058 03/23/19 0734 03/24/19 0218 03/24/19 1611 03/25/19 0249  NA 132* 135 140 134*  --  134*  K 3.6 3.5 3.3* 3.5 3.5 3.6  CL 98 100 100 95*  --  95*  CO2 22 24 29 27   --  26  GLUCOSE 530* 439* 93 326*  --  435*  BUN <5* <5* 6 8  --  7  CREATININE 0.71 0.69 0.65 0.62  --  0.63  CALCIUM 8.4* 8.3* 8.6* 8.3*  --  8.7*  MG  --   --   --   --  1.2* 1.7    Liver Function Tests: Recent Labs  Lab 03/23/19 0053  AST 41  ALT 19  ALKPHOS 122   BILITOT 0.8  PROT 6.0*  ALBUMIN 2.8*   No results for input(s): LIPASE, AMYLASE in the last 168 hours. No results for input(s): AMMONIA in the last 168 hours.  CBC: Recent Labs  Lab 03/22/19 1659  WBC 6.9  HGB 12.8  HCT 39.7  MCV 90.4  PLT 292    Cardiac Enzymes: No results for input(s): CKTOTAL, CKMB, CKMBINDEX, TROPONINI in the last 168 hours.  BNP: BNP (last 3 results) Recent Labs    02/04/19 1741 03/22/19 2154  BNP 1,418.4* 962.3*    ProBNP (last 3 results) No results for input(s): PROBNP in the last 8760 hours.    Other results:  Imaging: No results found.   Medications:     Scheduled Medications: . [MAR Hold] digoxin  0.125 mg Oral Daily  . [MAR Hold] enoxaparin (LOVENOX) injection  40 mg Subcutaneous Q24H  . [MAR Hold] furosemide  40 mg Intravenous BID  . [MAR Hold] insulin aspart  0-15 Units Subcutaneous TID WC  . [MAR Hold] insulin aspart  4 Units Subcutaneous TID WC  . [MAR Hold] insulin glargine  20 Units Subcutaneous Daily  . [MAR Hold] insulin glargine  8 Units Subcutaneous Once  . [MAR Hold] potassium chloride  20 mEq Oral BID  . [  MAR Hold] sacubitril-valsartan  1 tablet Oral BID  . [MAR Hold] sodium chloride flush  3 mL Intravenous Q12H  . [MAR Hold] spironolactone  25 mg Oral Daily    Infusions: . [MAR Hold] magnesium sulfate bolus IVPB      PRN Medications: [MAR Hold] acetaminophen **OR** [MAR Hold] acetaminophen, [MAR Hold] hydrALAZINE, [MAR Hold] ondansetron **OR** [MAR Hold] ondansetron (ZOFRAN) IV   Assessment/Plan:   1. Acute systolic HF  - Echo showed severely dilated LV with EF of 10-15% and grade 3 diastolic dysfunction. RV systolic function also noted to be moderately reduced - Volume status much improved - In crease entresto to 49/51 Continue digoxin. Continue spiro 25. No b-blocker yet  - Hold IV lasix until after cath - R/L cath toay - Likely etiology is NICM due to uncontrolled DM/HTN but also clear RFs for CAD.  Plan cath tomorrow. If no significant CAD will need cMRI to look for possible remote myocarditis.   2. Chest pain - troponin negative - strong Fhx for premature CAD - plan cath today.  -I have reviewed the risks, indications, and alternatives to angioplasty and stenting with the patient. Risks include but are not limited to bleeding, infection, vascular injury, stroke, myocardial infection, arrhythmia, kidney injury, radiation-related injury in the case of prolonged fluoroscopy use, emergency cardiac surgery, and death. The patient understands the risks of serious complication is low (<1%) and he agrees to proceed.   3. HTN - poorly controlled. Increasing Entresto  4. DM2 - poorly controlled. Primary team managing - eventually will need SGLT2i  5. Hypokalemia/Hypomag - supp  Length of Stay: 2   Arvilla Meres, MD 03/25/2019, 9:01 AM  Advanced Heart Failure Team Pager [phone] (M-F; 7a - 4p)  Please contact [facility] for night-coverage after hours (4p -7a ) and weekends on [email or web address]

## 2019-03-25 NOTE — Progress Notes (Addendum)
Pt blood sugar 531 @1625 ; Gave sliding scale and meal coverage total 19U. Rechecked blood sugar at aprox 1715 pt blood sugar 451  Notified Dr. Teryl Lucy of patient blood sugars. RN will continue to monitor

## 2019-03-25 NOTE — Progress Notes (Addendum)
Inpatient Diabetes Program Recommendations  AACE/ADA: New Consensus Statement on Inpatient Glycemic Control (2015)  Target Ranges:  Prepandial:   less than 140 mg/dL      Peak postprandial:   less than 180 mg/dL (1-2 hours)      Critically ill patients:  140 - 180 mg/dL   Lab Results  Component Value Date   GLUCAP 172 (H) 03/25/2019   HGBA1C 13.9 (H) 03/23/2019    Review of Glycemic Control Results for JAMEIA, MAKRIS (MRN [id number]) as of 03/25/2019 09:11  Ref. Range 03/24/2019 21:54 03/25/2019 06:26 03/25/2019 08:20  Glucose-Capillary Latest Ref Range: 70 - 99 mg/dL 355 (H) 397 (H) 172 (H)   Diabetes history:Type 2 DM Outpatient Diabetes medications:metformin 500 mg BID Current orders for Inpatient glycemic control:Novolog 0-15 units TID, Lantus 12 units QD, Novolog 4 units TID Lantus 8 units x 1  Inpatient Diabetes Program Recommendations:    Noted glucose trends increased to >400 mg/dL. In [facility] at 1743, meal coverage was not given. RN documented, "Not given because blood sugar was dropping." Blood sugar at that time was 114 mg/dL, which is within goal. Appears patient had late meal and carbohydrates were not covered.   Patient needs coverage with meals to avoid hyperglycemia.   Consider adding Novolog 0-5 units QHS.  Also, noted addition of Lantus 8 units. Unclear when this was scheduled per [facility]. However, would be in agreement if added for QHS.   Will plan to check back on patient following cath to ensure Lantus is given post procedure.   Addendum@1326 : Secure chat sent to MD.  Bronson Curb, MSN, RNC-OB Diabetes Coordinator [phone] (8a-5p)

## 2019-03-25 NOTE — Progress Notes (Signed)
Pt blood sugar 529, message sent to night coverage for [facility]. Awaiting new orders.

## 2019-03-25 NOTE — Progress Notes (Signed)
PROGRESS NOTE    Cheryl Mercer  OZD:[id number] DOB: 1985/09/04 DOA: 03/22/2019 PCP: Lorelee Market, MD   Brief Narrative: Cheryl Mercer is a 33 y.o. female with a history of hypertension, diabetes mellitus, remote cocaine use. She presents secondary to dyspnea on exertion and orthopnea and found to have acute systolic heart failure.   Assessment & Plan:   Principal Problem:   Acute combined systolic and diastolic congestive heart failure (HCC) Active Problems:   Uncontrolled type 2 diabetes mellitus with hyperglycemia (HCC)   Peripheral edema   Cardiomegaly   Proteinuria   Essential hypertension   Acute combined systolic and diastolic heart failure New diagnosis.  Possible secondary to uncontrolled hypertension diabetes mellitus.  Heart failure is consult for heart cath on 8/7.  Continuing aggressive IV diuresis.  Started on Entresto and spironolactone. -Continue Lasix IV per heart failure recommendations -Daily weights and outs -Heart catheterization today  Diabetes mellitus, type II Uncontrolled with hyperglycemia.  Hemoglobin A1c of 13.9%.  Patient was on metformin therapy as an outpatient.  Unfortunately we will start insulin therapy and her.  Will likely benefit from SGLT 2 inhibitor on discharge secondary to heart disease. -Continue sliding scale insulin -Lantus 20 units daily  Essential hypertension Associated hypertensive urgency. -Entresto, spironolactone -Hydralazine as needed  Proteinuria In setting of diabetes mellitus.  Associated hypoalbuminemia.  24-hour protein collection started on 8/5. 24 hour protein of 900 mg. Will benefit from [place].  Tachycardia Sinus. Per patient, she has not been taking metoprolol, so doubt rebound tachycardia. -Monitor  Hypomagnesemia -Repleted -Recheck in AM   DVT prophylaxis: Lovenox Code Status:   Code Status: Full Code Family Communication: None at bedside Disposition Plan: Discharge pending  continued cardiac work-up   Consultants:   Cardiology  Procedures:   Transthoracic Echocardiogram (03/23/2019) IMPRESSIONS    1. The left ventricle has a visually estimated ejection fraction of 10-15%%. The cavity size was severely dilated. Left ventricular diastolic Doppler parameters are consistent with restrictive filling.  2. The right ventricle has moderately reduced systolic function. The cavity was normal. There is no increase in right ventricular wall thickness. Right ventricular systolic pressure is mildly elevated with an estimated pressure of 33.7 mmHg.  3. Left atrial size was moderately dilated.  4. Right atrial size was mildly dilated.  5. No evidence of mitral valve stenosis.  6. No stenosis of the aortic valve.  7. The aorta is normal in size and structure.  8. The aortic root and ascending aorta are normal in size and structure.  9. The inferior vena cava was dilated in size with <50% respiratory variability.  FINDINGS  Left Ventricle: The left ventricle has a visually estimated ejection fraction of 10-15%. The cavity size was severely dilated. There is no increase in left ventricular wall thickness. Left ventricular diastolic Doppler parameters are consistent with  restrictive filling.  Right Ventricle: The right ventricle has moderately reduced systolic function. The cavity was normal. There is no increase in right ventricular wall thickness. Right ventricular systolic pressure is mildly elevated with an estimated pressure of 33.7  mmHg.  Left Atrium: Left atrial size was moderately dilated.  Right Atrium: Right atrial size was mildly dilated. Right atrial pressure is estimated at 15 mmHg.  Interatrial Septum: No atrial level shunt detected by color flow Doppler.  Pericardium: There is no evidence of pericardial effusion.  Mitral Valve: The mitral valve is normal in structure. Mitral valve regurgitation is trivial by color flow Doppler. No evidence of  mitral  valve stenosis.  Tricuspid Valve: The tricuspid valve is normal in structure. Tricuspid valve regurgitation was not visualized by color flow Doppler.  Aortic Valve: The aortic valve is normal in structure. Aortic valve regurgitation was not visualized by color flow Doppler. There is No stenosis of the aortic valve, with a calculated valve area of 2.27 cm.  Pulmonic Valve: The pulmonic valve was grossly normal. Pulmonic valve regurgitation is trivial by color flow Doppler.  Aorta: The aortic root and ascending aorta are normal in size and structure. The aorta is normal in size and structure.  Venous: The inferior vena cava is dilated in size with less than 50% respiratory variability.   Antimicrobials:  None   Subjective: Had some severe left sided leg cramps yesterday which have since resolved.  Objective: Vitals:   03/25/19 0938 03/25/19 0943 03/25/19 0947 03/25/19 1214  BP: (!) 139/98 (!) 142/99    Pulse: (!) 114 (!) 115  (!) 110  Resp: 17 (!) 21 (!) 0   Temp:      TempSrc:      SpO2: 97%     Weight:      Height:        Intake/Output Summary (Last 24 hours) at 03/25/2019 1310 Last data filed at 03/25/2019 0716 Gross per 24 hour  Intake -  Output 1700 ml  Net -1700 ml   Filed Weights   03/23/19 0241 03/24/19 0318 03/25/19 0300  Weight: 72.8 kg 71 kg 67.4 kg    Examination:  General exam: Appears calm and comfortable Respiratory system: Clear to auscultation. Respiratory effort normal. Cardiovascular system: S1 & S2 heard, RRR. No murmurs, rubs, gallops or clicks. Gastrointestinal system: Abdomen is nondistended, soft and nontender. No organomegaly or masses felt. Normal bowel sounds heard. Central nervous system: Alert and oriented. No focal neurological deficits. Extremities: Non-pitting edema. No calf tenderness Skin: No cyanosis. No rashes Psychiatry: Judgement and insight appear normal. Mood & affect appropriate.     Data Reviewed: I have  personally reviewed following labs and imaging studies  CBC: Recent Labs  Lab 03/22/19 1659 03/25/19 1033  WBC 6.9 9.9  HGB 12.8 15.6*  HCT 39.7 47.2*  MCV 90.4 89.2  PLT 292 387   Basic Metabolic Panel: Recent Labs  Lab 03/22/19 1659 03/23/19 0058 03/23/19 0734 03/24/19 0218 03/24/19 1611 03/25/19 0249 03/25/19 1033  NA 132* 135 140 134*  --  134*  --   K 3.6 3.5 3.3* 3.5 3.5 3.6  --   CL 98 100 100 95*  --  95*  --   CO2 22 24 29 27   --  26  --   GLUCOSE 530* 439* 93 326*  --  435*  --   BUN <5* <5* 6 8  --  7  --   CREATININE 0.71 0.69 0.65 0.62  --  0.63 0.66  CALCIUM 8.4* 8.3* 8.6* 8.3*  --  8.7*  --   MG  --   --   --   --  1.2* 1.7  --    GFR: Estimated Creatinine Clearance: 89.4 mL/min (by C-G formula based on SCr of 0.66 mg/dL). Liver Function Tests: Recent Labs  Lab 03/23/19 0053  AST 41  ALT 19  ALKPHOS 122  BILITOT 0.8  PROT 6.0*  ALBUMIN 2.8*   No results for input(s): LIPASE, AMYLASE in the last 168 hours. No results for input(s): AMMONIA in the last 168 hours. Coagulation Profile: No results for input(s): INR, PROTIME in the last  168 hours. Cardiac Enzymes: No results for input(s): CKTOTAL, CKMB, CKMBINDEX, TROPONINI in the last 168 hours. BNP (last 3 results) No results for input(s): PROBNP in the last 8760 hours. HbA1C: Recent Labs    03/23/19 0916  HGBA1C 13.9*   CBG: Recent Labs  Lab 03/25/19 0626 03/25/19 0820 03/25/19 0913 03/25/19 0932 03/25/19 1153  GLUCAP 397* 172* 79 117* 160*   Lipid Profile: Recent Labs    03/24/19 0218  CHOL 151  HDL 38*  LDLCALC 95  TRIG 92  CHOLHDL 4.0   Thyroid Function Tests: No results for input(s): TSH, T4TOTAL, FREET4, T3FREE, THYROIDAB in the last 72 hours. Anemia Panel: No results for input(s): VITAMINB12, FOLATE, FERRITIN, TIBC, IRON, RETICCTPCT in the last 72 hours. Sepsis Labs: No results for input(s): PROCALCITON, LATICACIDVEN in the last 168 hours.  Recent Results (from  the past 240 hour(s))  SARS CORONAVIRUS 2 Nasal Swab Aptima Multi Swab     Status: None   Collection Time: 03/23/19 12:36 AM   Specimen: Aptima Multi Swab; Nasal Swab  Result Value Ref Range Status   SARS Coronavirus 2 NEGATIVE NEGATIVE Final    Comment: (NOTE) SARS-CoV-2 target nucleic acids are NOT DETECTED. The SARS-CoV-2 RNA is generally detectable in upper and lower respiratory specimens during the acute phase of infection. Negative results do not preclude SARS-CoV-2 infection, do not rule out co-infections with other pathogens, and should not be used as the sole basis for treatment or other patient management decisions. Negative results must be combined with clinical observations, patient history, and epidemiological information. The expected result is Negative. Fact Sheet for Patients: HairSlick.nohttps://www.fda.gov/media/138098/download Fact Sheet for Healthcare Providers: quierodirigir.comhttps://www.fda.gov/media/138095/download This test is not yet approved or cleared by the [place] FDA and  has been authorized for detection and/or diagnosis of SARS-CoV-2 by FDA under an Emergency Use Authorization (EUA). This EUA will remain  in effect (meaning this test can be used) for the duration of the COVID-19 declaration under Section 56 4(b)(1) of the Act, 21 U.S.C. section 360bbb-3(b)(1), unless the authorization is terminated or revoked sooner. Performed at [facility], [address] [place], [place] [address]   MRSA PCR Screening     Status: None   Collection Time: 03/23/19  2:44 AM   Specimen: Nasal Mucosa; Nasopharyngeal  Result Value Ref Range Status   MRSA by PCR NEGATIVE NEGATIVE Final    Comment:        The GeneXpert MRSA Assay (FDA approved for NASAL specimens only), is one component of a comprehensive MRSA colonization surveillance program. It is not intended to diagnose MRSA infection nor to guide or monitor treatment for MRSA infections. Performed at [facility], [address] [place], [place] [address]          Radiology Studies: No results found.      Scheduled Meds: . digoxin  0.125 mg Oral Daily  . [START ON 03/26/2019] enoxaparin (LOVENOX) injection  40 mg Subcutaneous Q24H  . furosemide  40 mg Intravenous BID  . insulin aspart  0-15 Units Subcutaneous TID WC  . insulin aspart  0-5 Units Subcutaneous QHS  . insulin aspart  4 Units Subcutaneous TID WC  . insulin glargine  20 Units Subcutaneous Daily  . insulin glargine  8 Units Subcutaneous Once  . potassium chloride  20 mEq Oral BID  . sacubitril-valsartan  1 tablet Oral BID  . sodium chloride flush  3 mL Intravenous Q12H  . sodium chloride flush  3 mL  Intravenous Q12H  . spironolactone  25 mg Oral Daily   Continuous Infusions: . sodium chloride 50 mL/hr at 03/25/19 1100  . sodium chloride    . magnesium sulfate bolus IVPB 2 g (03/25/19 1230)     LOS: 2 days     Jacquelin Hawking, MD [facility] 03/25/2019, 1:10 PM  If 7PM-7AM, please contact night-coverage [email or web address]

## 2019-03-25 NOTE — Interval H&P Note (Signed)
History and Physical Interval Note:  03/25/2019 9:05 AM  Cheryl Mercer  has presented today for surgery, with the diagnosis of heart failure.  The various methods of treatment have been discussed with the patient and family. After consideration of risks, benefits and other options for treatment, the patient has consented to  Procedure(s): RIGHT/LEFT HEART CATH AND CORONARY ANGIOGRAPHY (N/A) and possible coronary angioplasty as a surgical intervention.  The patient's history has been reviewed, patient examined, no change in status, stable for surgery.  I have reviewed the patient's chart and labs.  Questions were answered to the patient's satisfaction.     Daniel Bensimhon

## 2019-03-25 NOTE — H&P (View-Only) (Signed)
Advanced Heart Failure Rounding Note   Subjective:    Continues to diurese well. Weight down another 8 pounds (14 pounds total) . BP still runnin ghigh. BS > 400 this am. No CP. Orthopnea improved.     Objective:   Weight Range:  Vital Signs:   Temp:  [97.6 F (36.4 C)-98.4 F (36.9 C)] 98.1 F (36.7 C) (08/07 0716) Pulse Rate:  [107-118] 110 (08/07 0716) Resp:  [8-13] 11 (08/07 0300) BP: (121-148)/(67-105) 142/100 (08/07 0300) SpO2:  [98 %-100 %] 98 % (08/07 0716) Weight:  [67.4 kg] 67.4 kg (08/07 0300) Last BM Date: 03/22/19  Weight change: Filed Weights   03/23/19 0241 03/24/19 0318 03/25/19 0300  Weight: 72.8 kg 71 kg 67.4 kg    Intake/Output:   Intake/Output Summary (Last 24 hours) at 03/25/2019 0901 Last data filed at 03/25/2019 0716 Gross per 24 hour  Intake 220 ml  Output 2600 ml  Net -2380 ml     Physical Exam: General:  Lying in bed. No resp difficulty HEENT: normal Neck: supple. JVP 7. Carotids 2+ bilat; no bruits. No lymphadenopathy or thryomegaly appreciated. Cor: PMI nondisplaced. Tachy reg + s3 Lungs: clear Abdomen: soft, nontender, nondistended. No hepatosplenomegaly. No bruits or masses. Good bowel sounds. Extremities: no cyanosis, clubbing, rash, tr edema Neuro: alert & orientedx3, cranial nerves grossly intact. moves all 4 extremities w/o difficulty. Affect pleasant   Telemetry: Stach 100-115 Personally reviewed   Labs: Basic Metabolic Panel: Recent Labs  Lab 03/22/19 1659 03/23/19 0058 03/23/19 0734 03/24/19 0218 03/24/19 1611 03/25/19 0249  NA 132* 135 140 134*  --  134*  K 3.6 3.5 3.3* 3.5 3.5 3.6  CL 98 100 100 95*  --  95*  CO2 22 24 29 27   --  26  GLUCOSE 530* 439* 93 326*  --  435*  BUN <5* <5* 6 8  --  7  CREATININE 0.71 0.69 0.65 0.62  --  0.63  CALCIUM 8.4* 8.3* 8.6* 8.3*  --  8.7*  MG  --   --   --   --  1.2* 1.7    Liver Function Tests: Recent Labs  Lab 03/23/19 0053  AST 41  ALT 19  ALKPHOS 122   BILITOT 0.8  PROT 6.0*  ALBUMIN 2.8*   No results for input(s): LIPASE, AMYLASE in the last 168 hours. No results for input(s): AMMONIA in the last 168 hours.  CBC: Recent Labs  Lab 03/22/19 1659  WBC 6.9  HGB 12.8  HCT 39.7  MCV 90.4  PLT 292    Cardiac Enzymes: No results for input(s): CKTOTAL, CKMB, CKMBINDEX, TROPONINI in the last 168 hours.  BNP: BNP (last 3 results) Recent Labs    02/04/19 1741 03/22/19 2154  BNP 1,418.4* 962.3*    ProBNP (last 3 results) No results for input(s): PROBNP in the last 8760 hours.    Other results:  Imaging: No results found.   Medications:     Scheduled Medications: . [MAR Hold] digoxin  0.125 mg Oral Daily  . [MAR Hold] enoxaparin (LOVENOX) injection  40 mg Subcutaneous Q24H  . [MAR Hold] furosemide  40 mg Intravenous BID  . [MAR Hold] insulin aspart  0-15 Units Subcutaneous TID WC  . [MAR Hold] insulin aspart  4 Units Subcutaneous TID WC  . [MAR Hold] insulin glargine  20 Units Subcutaneous Daily  . [MAR Hold] insulin glargine  8 Units Subcutaneous Once  . [MAR Hold] potassium chloride  20 mEq Oral BID  . [  MAR Hold] sacubitril-valsartan  1 tablet Oral BID  . [MAR Hold] sodium chloride flush  3 mL Intravenous Q12H  . [MAR Hold] spironolactone  25 mg Oral Daily    Infusions: . [MAR Hold] magnesium sulfate bolus IVPB      PRN Medications: [MAR Hold] acetaminophen **OR** [MAR Hold] acetaminophen, [MAR Hold] hydrALAZINE, [MAR Hold] ondansetron **OR** [MAR Hold] ondansetron (ZOFRAN) IV   Assessment/Plan:   1. Acute systolic HF  - Echo showed severely dilated LV with EF of 10-15% and grade 3 diastolic dysfunction. RV systolic function also noted to be moderately reduced - Volume status much improved - In crease entresto to 49/51 Continue digoxin. Continue spiro 25. No b-blocker yet  - Hold IV lasix until after cath - R/L cath toay - Likely etiology is NICM due to uncontrolled DM/HTN but also clear RFs for CAD.  Plan cath tomorrow. If no significant CAD will need cMRI to look for possible remote myocarditis.   2. Chest pain - troponin negative - strong Fhx for premature CAD - plan cath today.  -I have reviewed the risks, indications, and alternatives to angioplasty and stenting with the patient. Risks include but are not limited to bleeding, infection, vascular injury, stroke, myocardial infection, arrhythmia, kidney injury, radiation-related injury in the case of prolonged fluoroscopy use, emergency cardiac surgery, and death. The patient understands the risks of serious complication is low (<1%) and he agrees to proceed.   3. HTN - poorly controlled. Increasing Entresto  4. DM2 - poorly controlled. Primary team managing - eventually will need SGLT2i  5. Hypokalemia/Hypomag - supp  Length of Stay: 2   Giovanni Bath, MD 03/25/2019, 9:01 AM  Advanced Heart Failure Team Pager [phone] (M-F; 7a - 4p)  Please contact [facility] for night-coverage after hours (4p -7a ) and weekends on [email or web address]  

## 2019-03-26 LAB — CBC
HCT: 51.2 % — ABNORMAL HIGH (ref 36.0–46.0)
Hemoglobin: 17 g/dL — ABNORMAL HIGH (ref 12.0–15.0)
MCH: 29.3 pg (ref 26.0–34.0)
MCHC: 33.2 g/dL (ref 30.0–36.0)
MCV: 88.3 fL (ref 80.0–100.0)
Platelets: 359 10*3/uL (ref 150–400)
RBC: 5.8 MIL/uL — ABNORMAL HIGH (ref 3.87–5.11)
RDW: 13 % (ref 11.5–15.5)
WBC: 9.6 10*3/uL (ref 4.0–10.5)
nRBC: 0 % (ref 0.0–0.2)

## 2019-03-26 LAB — BASIC METABOLIC PANEL
Anion gap: 10 (ref 5–15)
BUN: 7 mg/dL (ref 6–20)
CO2: 28 mmol/L (ref 22–32)
Calcium: 9.1 mg/dL (ref 8.9–10.3)
Chloride: 96 mmol/L — ABNORMAL LOW (ref 98–111)
Creatinine, Ser: 0.68 mg/dL (ref 0.44–1.00)
GFR calc Af Amer: >60 mL/min (ref >60)
GFR calc non Af Amer: >60 mL/min (ref >60)
Glucose, Bld: 321 mg/dL — ABNORMAL HIGH (ref 70–99)
Potassium: 4.8 mmol/L (ref 3.5–5.1)
Sodium: 134 mmol/L — ABNORMAL LOW (ref 135–145)

## 2019-03-26 LAB — GLUCOSE, CAPILLARY
Glucose-Capillary: 175 mg/dL — ABNORMAL HIGH (ref 70–99)
Glucose-Capillary: 270 mg/dL — ABNORMAL HIGH (ref 70–99)
Glucose-Capillary: 279 mg/dL — ABNORMAL HIGH (ref 70–99)
Glucose-Capillary: 280 mg/dL — ABNORMAL HIGH (ref 70–99)
Glucose-Capillary: 301 mg/dL — ABNORMAL HIGH (ref 70–99)
Glucose-Capillary: 318 mg/dL — ABNORMAL HIGH (ref 70–99)

## 2019-03-26 LAB — MAGNESIUM: Magnesium: 1.6 mg/dL — ABNORMAL LOW (ref 1.7–2.4)

## 2019-03-26 MED ORDER — INSULIN GLARGINE 100 UNIT/ML ~~LOC~~ SOLN
25.0000 [IU] | Freq: Every day | SUBCUTANEOUS | Status: DC
  Filled 2019-03-26: qty 0.25

## 2019-03-26 MED ORDER — TRAMADOL HCL 50 MG PO TABS
50.0000 mg | ORAL_TABLET | Freq: Once | ORAL | Status: AC
  Administered 2019-03-26: 02:00:00 50 mg via ORAL
  Filled 2019-03-26: qty 1

## 2019-03-26 MED ORDER — CYCLOBENZAPRINE HCL 10 MG PO TABS
5.0000 mg | ORAL_TABLET | Freq: Three times a day (TID) | ORAL | Status: DC | PRN
  Administered 2019-03-26: 5 mg via ORAL
  Filled 2019-03-26: qty 1

## 2019-03-26 MED ORDER — INSULIN ASPART 100 UNIT/ML ~~LOC~~ SOLN
6.0000 [IU] | Freq: Three times a day (TID) | SUBCUTANEOUS | Status: DC
  Administered 2019-03-26 – 2019-03-27 (×3): 6 [IU] via SUBCUTANEOUS

## 2019-03-26 MED ORDER — TRAMADOL HCL 50 MG PO TABS
100.0000 mg | ORAL_TABLET | Freq: Four times a day (QID) | ORAL | Status: DC | PRN
  Administered 2019-03-27: 100 mg via ORAL
  Filled 2019-03-26: qty 2

## 2019-03-26 MED ORDER — MAGNESIUM SULFATE 2 GM/50ML IV SOLN
2.0000 g | Freq: Once | INTRAVENOUS | Status: AC
  Administered 2019-03-26: 2 g via INTRAVENOUS
  Filled 2019-03-26: qty 50

## 2019-03-26 MED ORDER — TRAMADOL HCL 50 MG PO TABS
100.0000 mg | ORAL_TABLET | Freq: Once | ORAL | Status: AC
  Administered 2019-03-26: 100 mg via ORAL
  Filled 2019-03-26: qty 2

## 2019-03-26 MED ORDER — SACUBITRIL-VALSARTAN 24-26 MG PO TABS
1.0000 | ORAL_TABLET | Freq: Two times a day (BID) | ORAL | Status: DC
  Administered 2019-03-26 – 2019-03-28 (×4): 1 via ORAL
  Filled 2019-03-26 (×4): qty 1

## 2019-03-26 MED ORDER — FENTANYL CITRATE (PF) 100 MCG/2ML IJ SOLN: 25.0000 ug | Freq: Once | INTRAMUSCULAR | Status: DC

## 2019-03-26 NOTE — Plan of Care (Signed)
  Problem: Education: Goal: Ability to describe self-care measures that may prevent or decrease complications (Diabetes Survival Skills Education) will improve Outcome: Progressing Goal: Individualized Educational Video(s) Outcome: Progressing   Problem: Cardiac: Goal: Ability to maintain an adequate cardiac output will improve Outcome: Progressing   Problem: Health Behavior/Discharge Planning: Goal: Ability to identify and utilize available resources and services will improve Outcome: Progressing Goal: Ability to manage health-related needs will improve Outcome: Progressing   Problem: Fluid Volume: Goal: Ability to achieve a balanced intake and output will improve Outcome: Progressing   Problem: Metabolic: Goal: Ability to maintain appropriate glucose levels will improve Outcome: Progressing   Problem: Nutritional: Goal: Maintenance of adequate nutrition will improve Outcome: Progressing Goal: Maintenance of adequate weight for body size and type will improve Outcome: Progressing   Problem: Respiratory: Goal: Will regain and/or maintain adequate ventilation Outcome: Progressing   Problem: Urinary Elimination: Goal: Ability to achieve and maintain adequate renal perfusion and functioning will improve Outcome: Progressing   Problem: Education: Goal: Ability to demonstrate management of disease process will improve Outcome: Progressing Goal: Ability to verbalize understanding of medication therapies will improve Outcome: Progressing Goal: Individualized Educational Video(s) Outcome: Progressing   Problem: Activity: Goal: Capacity to carry out activities will improve Outcome: Progressing   Problem: Cardiac: Goal: Ability to achieve and maintain adequate cardiopulmonary perfusion will improve Outcome: Progressing   Problem: Education: Goal: Knowledge of General Education information will improve Description: Including pain rating scale, medication(s)/side effects  and non-pharmacologic comfort measures Outcome: Progressing   Problem: Health Behavior/Discharge Planning: Goal: Ability to manage health-related needs will improve Outcome: Progressing   Problem: Clinical Measurements: Goal: Ability to maintain clinical measurements within normal limits will improve Outcome: Progressing Goal: Will remain free from infection Outcome: Progressing Goal: Diagnostic test results will improve Outcome: Progressing Goal: Respiratory complications will improve Outcome: Progressing Goal: Cardiovascular complication will be avoided Outcome: Progressing   Problem: Activity: Goal: Risk for activity intolerance will decrease Outcome: Progressing   Problem: Nutrition: Goal: Adequate nutrition will be maintained Outcome: Progressing   Problem: Coping: Goal: Level of anxiety will decrease Outcome: Progressing   Problem: Elimination: Goal: Will not experience complications related to bowel motility Outcome: Progressing Goal: Will not experience complications related to urinary retention Outcome: Progressing   Problem: Pain Managment: Goal: General experience of comfort will improve Outcome: Progressing   Problem: Safety: Goal: Ability to remain free from injury will improve Outcome: Progressing   Problem: Skin Integrity: Goal: Risk for impaired skin integrity will decrease Outcome: Progressing

## 2019-03-26 NOTE — Progress Notes (Signed)
PROGRESS NOTE    Cheryl Mercer  ZOX:[id number] DOB: May 29, 1986 DOA: 03/22/2019 PCP: Evelene CroonNiemeyer, Meindert, MD   Brief Narrative: Cheryl GheeChristina M Mercer is a 33 y.o. female with a history of hypertension, diabetes mellitus, remote cocaine use. She presents secondary to dyspnea on exertion and orthopnea and found to have acute systolic heart failure.   Assessment & Plan:   Principal Problem:   Acute combined systolic and diastolic congestive heart failure (HCC) Active Problems:   Uncontrolled type 2 diabetes mellitus with hyperglycemia (HCC)   Peripheral edema   Cardiomegaly   Proteinuria   Essential hypertension   Acute combined systolic and diastolic heart failure New diagnosis.  Possible secondary to uncontrolled hypertension diabetes mellitus.  Heart failure is consult for heart cath on 8/7.  Continuing aggressive IV diuresis.  Started on Entresto and spironolactone. Heart catheterization without CAD. Diuresed well. Currently appears to have reached maximum diuresis secondary to BP -Heart failure recommendations: holding diuresis today -Entresto/spironolactone  Diabetes mellitus, type II Uncontrolled with hyperglycemia.  Hemoglobin A1c of 13.9%.  Patient was on metformin therapy as an outpatient.  Unfortunately we will start insulin therapy and her.  Will likely benefit from SGLT 2 inhibitor on discharge secondary to heart disease. -Continue sliding scale insulin -Lantus 6 units TID WC -Increase to Lantus 25 units daily  Essential hypertension Associated hypertensive urgency initially, how with hypotension in setting of diuresis and antihypertensives -Entresto, spironolactone -Lasix held per cardiology  Proteinuria In setting of diabetes mellitus.  Associated hypoalbuminemia.  24-hour protein collection started on 8/5. 24 hour protein of 900 mg. Will benefit from ByronEntresto.  Tachycardia Sinus. Per patient, she has not been taking metoprolol, so doubt rebound  tachycardia. -Monitor  Hypomagnesemia Low this morning -Replete   DVT prophylaxis: Lovenox Code Status:   Code Status: Full Code Family Communication: None at bedside Disposition Plan: Discharge pending continued cardiac work-up and better control of blood sugar   Consultants:   Cardiology  Procedures:   Transthoracic Echocardiogram (03/23/2019) IMPRESSIONS    1. The left ventricle has a visually estimated ejection fraction of 10-15%%. The cavity size was severely dilated. Left ventricular diastolic Doppler parameters are consistent with restrictive filling.  2. The right ventricle has moderately reduced systolic function. The cavity was normal. There is no increase in right ventricular wall thickness. Right ventricular systolic pressure is mildly elevated with an estimated pressure of 33.7 mmHg.  3. Left atrial size was moderately dilated.  4. Right atrial size was mildly dilated.  5. No evidence of mitral valve stenosis.  6. No stenosis of the aortic valve.  7. The aorta is normal in size and structure.  8. The aortic root and ascending aorta are normal in size and structure.  9. The inferior vena cava was dilated in size with <50% respiratory variability.  FINDINGS  Left Ventricle: The left ventricle has a visually estimated ejection fraction of 10-15%. The cavity size was severely dilated. There is no increase in left ventricular wall thickness. Left ventricular diastolic Doppler parameters are consistent with  restrictive filling.  Right Ventricle: The right ventricle has moderately reduced systolic function. The cavity was normal. There is no increase in right ventricular wall thickness. Right ventricular systolic pressure is mildly elevated with an estimated pressure of 33.7  mmHg.  Left Atrium: Left atrial size was moderately dilated.  Right Atrium: Right atrial size was mildly dilated. Right atrial pressure is estimated at 15 mmHg.  Interatrial Septum: No  atrial level shunt detected by color flow  Doppler.  Pericardium: There is no evidence of pericardial effusion.  Mitral Valve: The mitral valve is normal in structure. Mitral valve regurgitation is trivial by color flow Doppler. No evidence of mitral valve stenosis.  Tricuspid Valve: The tricuspid valve is normal in structure. Tricuspid valve regurgitation was not visualized by color flow Doppler.  Aortic Valve: The aortic valve is normal in structure. Aortic valve regurgitation was not visualized by color flow Doppler. There is No stenosis of the aortic valve, with a calculated valve area of 2.27 cm.  Pulmonic Valve: The pulmonic valve was grossly normal. Pulmonic valve regurgitation is trivial by color flow Doppler.  Aorta: The aortic root and ascending aorta are normal in size and structure. The aorta is normal in size and structure.  Venous: The inferior vena cava is dilated in size with less than 50% respiratory variability.   Antimicrobials:  None   Subjective: Had some severe left sided leg cramps yesterday which have since resolved.  Objective: Vitals:   03/26/19 0910 03/26/19 1000 03/26/19 1100 03/26/19 1518  BP:  119/87 117/83 97/82  Pulse: (!) 101 (!) 102 (!) 108   Resp:  13 13 12   Temp:   98 F (36.7 C) 98 F (36.7 C)  TempSrc:   Oral Oral  SpO2:  100% 100%   Weight:      Height:        Intake/Output Summary (Last 24 hours) at 03/26/2019 1545 Last data filed at 03/26/2019 1430 Gross per 24 hour  Intake 780 ml  Output 4750 ml  Net -3970 ml   Filed Weights   03/23/19 0241 03/24/19 0318 03/25/19 0300  Weight: 72.8 kg 71 kg 67.4 kg    Examination:  General exam: Appears calm and comfortable Respiratory system: Clear to auscultation. Respiratory effort normal. Cardiovascular system: S1 & S2 heard, RRR. No murmurs, rubs, gallops or clicks. Gastrointestinal system: Abdomen is nondistended, soft and nontender. No organomegaly or masses felt. Normal  bowel sounds heard. Central nervous system: Alert and oriented. No focal neurological deficits. Extremities: Non-pitting edema. No calf tenderness Skin: No cyanosis. No rashes Psychiatry: Judgement and insight appear normal. Mood & affect appropriate.     Data Reviewed: I have personally reviewed following labs and imaging studies  CBC: Recent Labs  Lab 03/22/19 1659 03/25/19 0915 03/25/19 0921 03/25/19 0922 03/25/19 1033 03/26/19 0514  WBC 6.9  --   --   --  9.9 9.6  HGB 12.8 13.9 14.6 14.3 15.6* 17.0*  HCT 39.7 41.0 43.0 42.0 47.2* 51.2*  MCV 90.4  --   --   --  89.2 88.3  PLT 292  --   --   --  387 359   Basic Metabolic Panel: Recent Labs  Lab 03/23/19 0058 03/23/19 0734 03/24/19 0218 03/24/19 1611 03/25/19 0249 03/25/19 0915 03/25/19 0921 03/25/19 0922 03/25/19 1033 03/26/19 0514  NA 135 140 134*  --  134* 144 141 142  --  134*  K 3.5 3.3* 3.5 3.5 3.6 3.4* 3.7 3.5  --  4.8  CL 100 100 95*  --  95*  --   --   --   --  96*  CO2 24 29 27   --  26  --   --   --   --  28  GLUCOSE 439* 93 326*  --  435*  --   --   --   --  321*  BUN <5* 6 8  --  7  --   --   --   --  7  CREATININE 0.69 0.65 0.62  --  0.63  --   --   --  0.66 0.68  CALCIUM 8.3* 8.6* 8.3*  --  8.7*  --   --   --   --  9.1  MG  --   --   --  1.2* 1.7  --   --   --   --  1.6*   GFR: Estimated Creatinine Clearance: 89.4 mL/min (by C-G formula based on SCr of 0.68 mg/dL). Liver Function Tests: Recent Labs  Lab 03/23/19 0053  AST 41  ALT 19  ALKPHOS 122  BILITOT 0.8  PROT 6.0*  ALBUMIN 2.8*   No results for input(s): LIPASE, AMYLASE in the last 168 hours. No results for input(s): AMMONIA in the last 168 hours. Coagulation Profile: No results for input(s): INR, PROTIME in the last 168 hours. Cardiac Enzymes: No results for input(s): CKTOTAL, CKMB, CKMBINDEX, TROPONINI in the last 168 hours. BNP (last 3 results) No results for input(s): PROBNP in the last 8760 hours. HbA1C: No results for  input(s): HGBA1C in the last 72 hours. CBG: Recent Labs  Lab 03/25/19 1711 03/25/19 2119 03/26/19 0028 03/26/19 0608 03/26/19 1102  GLUCAP 451* 529* 175* 279* 270*   Lipid Profile: Recent Labs    03/24/19 0218  CHOL 151  HDL 38*  LDLCALC 95  TRIG 92  CHOLHDL 4.0   Thyroid Function Tests: No results for input(s): TSH, T4TOTAL, FREET4, T3FREE, THYROIDAB in the last 72 hours. Anemia Panel: No results for input(s): VITAMINB12, FOLATE, FERRITIN, TIBC, IRON, RETICCTPCT in the last 72 hours. Sepsis Labs: No results for input(s): PROCALCITON, LATICACIDVEN in the last 168 hours.  Recent Results (from the past 240 hour(s))  SARS CORONAVIRUS 2 Nasal Swab Aptima Multi Swab     Status: None   Collection Time: 03/23/19 12:36 AM   Specimen: Aptima Multi Swab; Nasal Swab  Result Value Ref Range Status   SARS Coronavirus 2 NEGATIVE NEGATIVE Final    Comment: (NOTE) SARS-CoV-2 target nucleic acids are NOT DETECTED. The SARS-CoV-2 RNA is generally detectable in upper and lower respiratory specimens during the acute phase of infection. Negative results do not preclude SARS-CoV-2 infection, do not rule out co-infections with other pathogens, and should not be used as the sole basis for treatment or other patient management decisions. Negative results must be combined with clinical observations, patient history, and epidemiological information. The expected result is Negative. Fact Sheet for Patients: HairSlick.nohttps://www.fda.gov/media/138098/download Fact Sheet for Healthcare Providers: quierodirigir.comhttps://www.fda.gov/media/138095/download This test is not yet approved or cleared by the [place] FDA and  has been authorized for detection and/or diagnosis of SARS-CoV-2 by FDA under an Emergency Use Authorization (EUA). This EUA will remain  in effect (meaning this test can be used) for the duration of the COVID-19 declaration under Section 56 4(b)(1) of the Act, 21 U.S.C. section 360bbb-3(b)(1),  unless the authorization is terminated or revoked sooner. Performed at [facility], [address] [place], [place] [address]   MRSA PCR Screening     Status: None   Collection Time: 03/23/19  2:44 AM   Specimen: Nasal Mucosa; Nasopharyngeal  Result Value Ref Range Status   MRSA by PCR NEGATIVE NEGATIVE Final    Comment:        The GeneXpert MRSA Assay (FDA approved for NASAL specimens only), is one component of a comprehensive MRSA colonization surveillance program. It is not intended to diagnose MRSA infection nor to guide or monitor treatment for MRSA  infections. Performed at [facility], [place] [address] [place], [place] [address]          Radiology Studies: Mr Cardiac Morphology W Wo Contrast  Result Date: 03/25/2019 CLINICAL DATA:  Heart failure suspected, acute MI, LV function eval during initial hospitalization COMPARISON: Echocardiogram 03/23/2019 EXAM: CARDIAC MRI TECHNIQUE: The patient was scanned on a 1.5 Tesla GE magnet. A dedicated cardiac coil was used. Functional imaging was done using Fiesta sequences. 2,3, and 4 chamber views were done to assess for RWMA's. Modified Simpson's rule using a short axis stack was used to calculate an ejection fraction on a dedicated work Conservation officer, nature. The patient received 10 cc of Gadavist. After 10 minutes inversion recovery sequences were used to assess for infiltration and scar tissue. CONTRAST:  10 cc  of Gadavist FINDINGS: LEFT VENTRICLE: Severely reduced left ventricular systolic function. EF quantitation inaccurate due to motion artifact (patient did not breath hold), visually estimated EF 15%. Severe global hypokinesis. Mild LV dilation (LVEDD 55 mm). Normal wall thickness. Mild, nonspecific late gadolinium enhancement in the mid-myocardium of the basal left ventricular septal myocardium. No definite findings of myocarditis, infarction, scar, or infiltrative/granulomatous process. Normal T1 myocardial  nulling kinetics, suggesting against a diagnosis of cardiac amyloidosis. RIGHT VENTRICLE: Normal right ventricular size, thickness. Mildly reduced systolic function. ATRIA: Moderate left atrial enlargement, mild right atrial enlargement. VALVES: No significant valvular abnormalities, assessment limited by motion artifact. PERICARDIUM: Trivial-small circumferential pericardial effusion. Evidence of patchy delayed pericardial enhancement, primarily anterior-superior to the RV, suggesting possible pericarditis. No definite pericardial edema on T2 weighted images. OTHER: No significant extracardiac findings. MEASUREMENTS: Inaccurate due to significant motion artifact. IMPRESSION: 1. Severely reduced left ventricular systolic function with a visually estimated LVEF = 15%. Severe global hypokinesis. 2. Nonspecific, mild, mid-myocardial stripe of delayed enhancement in the basal ventricular septum. No definite findings to suggest myocarditis. 3. Patchy delayed enhancement of the pericardium with a trivial to small circumferential pericardial effusion, suggesting possible pericarditis. Electronically Signed   By: Cherlynn Kaiser   On: 03/25/2019 16:41        Scheduled Meds: . digoxin  0.125 mg Oral Daily  . enoxaparin (LOVENOX) injection  40 mg Subcutaneous Q24H  . fentaNYL (SUBLIMAZE) injection  25 mcg Intravenous Once  . insulin aspart  0-15 Units Subcutaneous TID WC  . insulin aspart  0-5 Units Subcutaneous QHS  . insulin aspart  6 Units Subcutaneous TID WC  . [START ON 03/27/2019] insulin glargine  25 Units Subcutaneous Daily  . sacubitril-valsartan  1 tablet Oral BID  . sodium chloride flush  3 mL Intravenous Q12H  . sodium chloride flush  3 mL Intravenous Q12H  . spironolactone  25 mg Oral Daily   Continuous Infusions: . sodium chloride    . sodium chloride       LOS: 3 days     Cordelia Poche, MD [facility] 03/26/2019, 3:45 PM  If 7PM-7AM, please contact night-coverage  [email or web address]

## 2019-03-26 NOTE — Progress Notes (Signed)
Advanced Heart Failure Rounding Note   Subjective:    Cath 8/7 No CAD. Well compensated hemodynamics with low filling pressures. Yesterday developed severe pain in left arm which now resolved. No says she feels weak + HA. No other focal deficits. BP running 80-105.   Denies CP or SOB.    cMRI EF 15% No significant LGE   Objective:   Weight Range:  Vital Signs:   Temp:  [97 F (36.1 C)-98.4 F (36.9 C)] 97.9 F (36.6 C) (08/08 0833) Pulse Rate:  [101-116] 101 (08/08 0910) Resp:  [0-21] 16 (08/08 0833) BP: (85-142)/(60-111) 85/63 (08/08 0833) SpO2:  [97 %-99 %] 98 % (08/08 0833) Last BM Date: 03/26/19  Weight change: Filed Weights   03/23/19 0241 03/24/19 0318 03/25/19 0300  Weight: 72.8 kg 71 kg 67.4 kg    Intake/Output:   Intake/Output Summary (Last 24 hours) at 03/26/2019 0931 Last data filed at 03/26/2019 0800 Gross per 24 hour  Intake 642 ml  Output 4550 ml  Net -3908 ml     Physical Exam: General:  Lying in bed. No resp difficulty HEENT: normal Neck: supple. no JVD. Carotids 2+ bilat; no bruits. No lymphadenopathy or thryomegaly appreciated. Cor: PMI nondisplaced. Tachy regular +s3 Lungs: clear Abdomen: soft, nontender, nondistended. No hepatosplenomegaly. No bruits or masses. Good bowel sounds. Extremities: no cyanosis, clubbing, rash, edema Neuro: alert & orientedx3, cranial nerves grossly intact. moves all 4 extremities w/o difficulty. Affect pleasant   Telemetry: Stach 100-115 Personally reviewed   Labs: Basic Metabolic Panel: Recent Labs  Lab 03/23/19 0058 03/23/19 0734 03/24/19 0218 03/24/19 1611 03/25/19 0249 03/25/19 0915 03/25/19 0921 03/25/19 0922 03/25/19 1033 03/26/19 0514  NA 135 140 134*  --  134* 144 141 142  --  134*  K 3.5 3.3* 3.5 3.5 3.6 3.4* 3.7 3.5  --  4.8  CL 100 100 95*  --  95*  --   --   --   --  96*  CO2 24 29 27   --  26  --   --   --   --  28  GLUCOSE 439* 93 326*  --  435*  --   --   --   --  321*  BUN <5*  6 8  --  7  --   --   --   --  7  CREATININE 0.69 0.65 0.62  --  0.63  --   --   --  0.66 0.68  CALCIUM 8.3* 8.6* 8.3*  --  8.7*  --   --   --   --  9.1  MG  --   --   --  1.2* 1.7  --   --   --   --  1.6*    Liver Function Tests: Recent Labs  Lab 03/23/19 0053  AST 41  ALT 19  ALKPHOS 122  BILITOT 0.8  PROT 6.0*  ALBUMIN 2.8*   No results for input(s): LIPASE, AMYLASE in the last 168 hours. No results for input(s): AMMONIA in the last 168 hours.  CBC: Recent Labs  Lab 03/22/19 1659 03/25/19 0915 03/25/19 0921 03/25/19 0922 03/25/19 1033 03/26/19 0514  WBC 6.9  --   --   --  9.9 9.6  HGB 12.8 13.9 14.6 14.3 15.6* 17.0*  HCT 39.7 41.0 43.0 42.0 47.2* 51.2*  MCV 90.4  --   --   --  89.2 88.3  PLT 292  --   --   --  387 359    Cardiac Enzymes: No results for input(s): CKTOTAL, CKMB, CKMBINDEX, TROPONINI in the last 168 hours.  BNP: BNP (last 3 results) Recent Labs    02/04/19 1741 03/22/19 2154  BNP 1,418.4* 962.3*    ProBNP (last 3 results) No results for input(s): PROBNP in the last 8760 hours.    Other results:  Imaging: Mr Cardiac Morphology W Wo Contrast  Result Date: 03/25/2019 CLINICAL DATA:  Heart failure suspected, acute MI, LV function eval during initial hospitalization COMPARISON: Echocardiogram 03/23/2019 EXAM: CARDIAC MRI TECHNIQUE: The patient was scanned on a 1.5 Tesla GE magnet. A dedicated cardiac coil was used. Functional imaging was done using Fiesta sequences. 2,3, and 4 chamber views were done to assess for RWMA's. Modified Simpson's rule using a short axis stack was used to calculate an ejection fraction on a dedicated work Conservation officer, nature. The patient received 10 cc of Gadavist. After 10 minutes inversion recovery sequences were used to assess for infiltration and scar tissue. CONTRAST:  10 cc  of Gadavist FINDINGS: LEFT VENTRICLE: Severely reduced left ventricular systolic function. EF quantitation inaccurate due to motion  artifact (patient did not breath hold), visually estimated EF 15%. Severe global hypokinesis. Mild LV dilation (LVEDD 55 mm). Normal wall thickness. Mild, nonspecific late gadolinium enhancement in the mid-myocardium of the basal left ventricular septal myocardium. No definite findings of myocarditis, infarction, scar, or infiltrative/granulomatous process. Normal T1 myocardial nulling kinetics, suggesting against a diagnosis of cardiac amyloidosis. RIGHT VENTRICLE: Normal right ventricular size, thickness. Mildly reduced systolic function. ATRIA: Moderate left atrial enlargement, mild right atrial enlargement. VALVES: No significant valvular abnormalities, assessment limited by motion artifact. PERICARDIUM: Trivial-small circumferential pericardial effusion. Evidence of patchy delayed pericardial enhancement, primarily anterior-superior to the RV, suggesting possible pericarditis. No definite pericardial edema on T2 weighted images. OTHER: No significant extracardiac findings. MEASUREMENTS: Inaccurate due to significant motion artifact. IMPRESSION: 1. Severely reduced left ventricular systolic function with a visually estimated LVEF = 15%. Severe global hypokinesis. 2. Nonspecific, mild, mid-myocardial stripe of delayed enhancement in the basal ventricular septum. No definite findings to suggest myocarditis. 3. Patchy delayed enhancement of the pericardium with a trivial to small circumferential pericardial effusion, suggesting possible pericarditis. Electronically Signed   By: Cherlynn Kaiser   On: 03/25/2019 16:41     Medications:     Scheduled Medications:  digoxin  0.125 mg Oral Daily   enoxaparin (LOVENOX) injection  40 mg Subcutaneous Q24H   fentaNYL (SUBLIMAZE) injection  25 mcg Intravenous Once   furosemide  40 mg Intravenous BID   insulin aspart  0-15 Units Subcutaneous TID WC   insulin aspart  0-5 Units Subcutaneous QHS   insulin aspart  6 Units Subcutaneous TID WC   insulin  glargine  20 Units Subcutaneous Daily   potassium chloride  20 mEq Oral BID   sacubitril-valsartan  1 tablet Oral BID   sodium chloride flush  3 mL Intravenous Q12H   sodium chloride flush  3 mL Intravenous Q12H   spironolactone  25 mg Oral Daily    Infusions:  sodium chloride     sodium chloride     magnesium sulfate bolus IVPB 2 g (03/26/19 0909)    PRN Medications: sodium chloride, sodium chloride, acetaminophen, cyclobenzaprine, hydrALAZINE, ondansetron **OR** ondansetron (ZOFRAN) IV, sodium chloride flush, sodium chloride flush   Assessment/Plan:   1. Acute systolic HF  - Echo showed severely dilated LV with EF of 10-15% and grade 3 diastolic dysfunction. RV systolic function also noted  to be moderately reduced - Cath 8/7 No CAD - cmRI 8/7 EF 15% no significant LGE - Volume status much improved - now low. Hold diuretics.  - Bp low. Drop entresto to 24/26 Continue digoxin. Continue spiro 25. 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 No significant scar on MRI so hopeful with LV recovery with control of HTN/DM.   2. Chest pain - troponin negative - Cath no CAD  3. HTN - BP low. Decrease Entresto as above.   4. DM2 - poorly controlled. Primary team managing - eventually will need SGLT2i  5. Hypokalemia/Hypomag - supp  Length of Stay: 3   Arvilla Meresaniel Alva Kuenzel, MD 03/26/2019, 9:31 AM  Advanced Heart Failure Team Pager [phone] (M-F; 7a - 4p)  Please contact [facility] for night-coverage after hours (4p -7a ) and weekends on [email or web address]

## 2019-03-26 NOTE — Progress Notes (Signed)
Pt c/o headache on left side of head, not relieved by tylenol. Has hx of migraines per chart. On Call Physician notified, awaiting orders.

## 2019-03-27 DIAGNOSIS — R609 Edema, unspecified: Secondary | ICD-10-CM

## 2019-03-27 DIAGNOSIS — R809 Proteinuria, unspecified: Secondary | ICD-10-CM

## 2019-03-27 LAB — GLUCOSE, CAPILLARY
Glucose-Capillary: 315 mg/dL — ABNORMAL HIGH (ref 70–99)
Glucose-Capillary: 175 mg/dL — ABNORMAL HIGH (ref 70–99)
Glucose-Capillary: 213 mg/dL — ABNORMAL HIGH (ref 70–99)
Glucose-Capillary: 270 mg/dL — ABNORMAL HIGH (ref 70–99)
Glucose-Capillary: 196 mg/dL — ABNORMAL HIGH (ref 70–99)

## 2019-03-27 LAB — DIGOXIN LEVEL: Digoxin Level: <0.2 ng/mL — ABNORMAL LOW (ref 0.8–2.0)

## 2019-03-27 MED ORDER — INSULIN GLARGINE 100 UNIT/ML ~~LOC~~ SOLN
30.0000 [IU] | Freq: Every day | SUBCUTANEOUS | Status: DC
  Administered 2019-03-27 – 2019-03-28 (×2): 30 [IU] via SUBCUTANEOUS
  Filled 2019-03-27 (×2): qty 0.3

## 2019-03-27 MED ORDER — INSULIN ASPART 100 UNIT/ML ~~LOC~~ SOLN
10.0000 [IU] | Freq: Three times a day (TID) | SUBCUTANEOUS | Status: DC
  Administered 2019-03-27 – 2019-03-28 (×4): 10 [IU] via SUBCUTANEOUS

## 2019-03-27 NOTE — Progress Notes (Signed)
PROGRESS NOTE    Cheryl Mercer  EQA:[id number] DOB: 1986/02/11 DOA: 03/22/2019 PCP: Lorelee Market, MD   Brief Narrative: Cheryl Mercer is a 33 y.o. female with a history of hypertension, diabetes mellitus, remote cocaine use. She presents secondary to dyspnea on exertion and orthopnea and found to have acute systolic heart failure.   Assessment & Plan:   Principal Problem:   Acute combined systolic and diastolic congestive heart failure (HCC) Active Problems:   Uncontrolled type 2 diabetes mellitus with hyperglycemia (HCC)   Peripheral edema   Cardiomegaly   Proteinuria   Essential hypertension   Acute combined systolic and diastolic heart failure New diagnosis.  Possible secondary to uncontrolled hypertension diabetes mellitus.  Heart failure is consult for heart cath on 8/7.  Continuing aggressive IV diuresis.  Started on Entresto and spironolactone. Heart catheterization without CAD. Diuresed well. Currently appears to have reached maximum diuresis secondary to BP -Heart failure recommendations: holding diuresis; add back as needed; ambulation today -Entresto/spironolactone  Diabetes mellitus, type II Uncontrolled with hyperglycemia.  Hemoglobin A1c of 13.9%.  Patient was on metformin therapy as an outpatient.  Unfortunately we will start insulin therapy and her.  Will likely benefit from SGLT 2 inhibitor on discharge secondary to heart disease. -Continue sliding scale insulin -Increase to Lantus 10 units TID WC -Increase to Lantus 30 units daily  Essential hypertension Associated hypertensive urgency initially, how with hypotension in setting of diuresis and antihypertensives -Entresto, spironolactone -Lasix held per cardiology  Proteinuria In setting of diabetes mellitus.  Associated hypoalbuminemia.  24-hour protein collection started on 8/5. 24 hour protein of 900 mg. Will benefit from [place].  Tachycardia Sinus. Per patient, she has not been  taking metoprolol, so doubt rebound tachycardia. -Monitor  Hypomagnesemia Low this morning -Replete   DVT prophylaxis: Lovenox Code Status:   Code Status: Full Code Family Communication: None at bedside Disposition Plan: Discharge pending continued cardiac work-up and better control of blood sugar   Consultants:   Cardiology  Procedures:   Transthoracic Echocardiogram (03/23/2019) IMPRESSIONS    1. The left ventricle has a visually estimated ejection fraction of 10-15%%. The cavity size was severely dilated. Left ventricular diastolic Doppler parameters are consistent with restrictive filling.  2. The right ventricle has moderately reduced systolic function. The cavity was normal. There is no increase in right ventricular wall thickness. Right ventricular systolic pressure is mildly elevated with an estimated pressure of 33.7 mmHg.  3. Left atrial size was moderately dilated.  4. Right atrial size was mildly dilated.  5. No evidence of mitral valve stenosis.  6. No stenosis of the aortic valve.  7. The aorta is normal in size and structure.  8. The aortic root and ascending aorta are normal in size and structure.  9. The inferior vena cava was dilated in size with <50% respiratory variability.  FINDINGS  Left Ventricle: The left ventricle has a visually estimated ejection fraction of 10-15%. The cavity size was severely dilated. There is no increase in left ventricular wall thickness. Left ventricular diastolic Doppler parameters are consistent with  restrictive filling.  Right Ventricle: The right ventricle has moderately reduced systolic function. The cavity was normal. There is no increase in right ventricular wall thickness. Right ventricular systolic pressure is mildly elevated with an estimated pressure of 33.7  mmHg.  Left Atrium: Left atrial size was moderately dilated.  Right Atrium: Right atrial size was mildly dilated. Right atrial pressure is estimated at 15  mmHg.  Interatrial Septum: No  atrial level shunt detected by color flow Doppler.  Pericardium: There is no evidence of pericardial effusion.  Mitral Valve: The mitral valve is normal in structure. Mitral valve regurgitation is trivial by color flow Doppler. No evidence of mitral valve stenosis.  Tricuspid Valve: The tricuspid valve is normal in structure. Tricuspid valve regurgitation was not visualized by color flow Doppler.  Aortic Valve: The aortic valve is normal in structure. Aortic valve regurgitation was not visualized by color flow Doppler. There is No stenosis of the aortic valve, with a calculated valve area of 2.27 cm.  Pulmonic Valve: The pulmonic valve was grossly normal. Pulmonic valve regurgitation is trivial by color flow Doppler.  Aorta: The aortic root and ascending aorta are normal in size and structure. The aorta is normal in size and structure.  Venous: The inferior vena cava is dilated in size with less than 50% respiratory variability.   Antimicrobials:  None   Subjective: No issues overnight. No chest pain. Breathing much better. Swelling resolved.  Objective: Vitals:   03/26/19 1915 03/26/19 2300 03/27/19 0300 03/27/19 0731  BP:  (!) 132/96 (!) 129/91 (!) 117/93  Pulse:  (!) 118 (!) 116 (!) 113  Resp: 12 (!) 9 10 18   Temp:  98.2 F (36.8 C) 98 F (36.7 C) 97.8 F (36.6 C)  TempSrc:  Oral Oral Oral  SpO2:  98% 99% 99%  Weight:      Height:        Intake/Output Summary (Last 24 hours) at 03/27/2019 0901 Last data filed at 03/27/2019 0300 Gross per 24 hour  Intake 838 ml  Output -  Net 838 ml   Filed Weights   03/23/19 0241 03/24/19 0318 03/25/19 0300  Weight: 72.8 kg 71 kg 67.4 kg    Examination:  General exam: Appears calm and comfortable Respiratory system: Clear to auscultation. Respiratory effort normal. Cardiovascular system: S1 & S2 heard, Tachycardia, normal rhythm. No murmurs, rubs, gallops or clicks. Gastrointestinal  system: Abdomen is nondistended, soft and nontender. No organomegaly or masses felt. Normal bowel sounds heard. Central nervous system: Alert and oriented. No focal neurological deficits. Extremities: No edema. No calf tenderness Skin: No cyanosis. No rashes Psychiatry: Judgement and insight appear normal. Mood & affect appropriate.     Data Reviewed: I have personally reviewed following labs and imaging studies  CBC: Recent Labs  Lab 03/22/19 1659 03/25/19 0915 03/25/19 0921 03/25/19 0922 03/25/19 1033 03/26/19 0514  WBC 6.9  --   --   --  9.9 9.6  HGB 12.8 13.9 14.6 14.3 15.6* 17.0*  HCT 39.7 41.0 43.0 42.0 47.2* 51.2*  MCV 90.4  --   --   --  89.2 88.3  PLT 292  --   --   --  387 359   Basic Metabolic Panel: Recent Labs  Lab 03/23/19 0058 03/23/19 0734 03/24/19 0218 03/24/19 1611 03/25/19 0249 03/25/19 0915 03/25/19 0921 03/25/19 0922 03/25/19 1033 03/26/19 0514  NA 135 140 134*  --  134* 144 141 142  --  134*  K 3.5 3.3* 3.5 3.5 3.6 3.4* 3.7 3.5  --  4.8  CL 100 100 95*  --  95*  --   --   --   --  96*  CO2 24 29 27   --  26  --   --   --   --  28  GLUCOSE 439* 93 326*  --  435*  --   --   --   --  321*  BUN <5* 6 8  --  7  --   --   --   --  7  CREATININE 0.69 0.65 0.62  --  0.63  --   --   --  0.66 0.68  CALCIUM 8.3* 8.6* 8.3*  --  8.7*  --   --   --   --  9.1  MG  --   --   --  1.2* 1.7  --   --   --   --  1.6*   GFR: Estimated Creatinine Clearance: 89.4 mL/min (by C-G formula based on SCr of 0.68 mg/dL). Liver Function Tests: Recent Labs  Lab 03/23/19 0053  AST 41  ALT 19  ALKPHOS 122  BILITOT 0.8  PROT 6.0*  ALBUMIN 2.8*   No results for input(s): LIPASE, AMYLASE in the last 168 hours. No results for input(s): AMMONIA in the last 168 hours. Coagulation Profile: No results for input(s): INR, PROTIME in the last 168 hours. Cardiac Enzymes: No results for input(s): CKTOTAL, CKMB, CKMBINDEX, TROPONINI in the last 168 hours. BNP (last 3 results)  No results for input(s): PROBNP in the last 8760 hours. HbA1C: No results for input(s): HGBA1C in the last 72 hours. CBG: Recent Labs  Lab 03/26/19 1102 03/26/19 1600 03/26/19 1654 03/26/19 2115 03/27/19 0630  GLUCAP 270* 301* 280* 318* 315*   Lipid Profile: No results for input(s): CHOL, HDL, LDLCALC, TRIG, CHOLHDL, LDLDIRECT in the last 72 hours. Thyroid Function Tests: No results for input(s): TSH, T4TOTAL, FREET4, T3FREE, THYROIDAB in the last 72 hours. Anemia Panel: No results for input(s): VITAMINB12, FOLATE, FERRITIN, TIBC, IRON, RETICCTPCT in the last 72 hours. Sepsis Labs: No results for input(s): PROCALCITON, LATICACIDVEN in the last 168 hours.  Recent Results (from the past 240 hour(s))  SARS CORONAVIRUS 2 Nasal Swab Aptima Multi Swab     Status: None   Collection Time: 03/23/19 12:36 AM   Specimen: Aptima Multi Swab; Nasal Swab  Result Value Ref Range Status   SARS Coronavirus 2 NEGATIVE NEGATIVE Final    Comment: (NOTE) SARS-CoV-2 target nucleic acids are NOT DETECTED. The SARS-CoV-2 RNA is generally detectable in upper and lower respiratory specimens during the acute phase of infection. Negative results do not preclude SARS-CoV-2 infection, do not rule out co-infections with other pathogens, and should not be used as the sole basis for treatment or other patient management decisions. Negative results must be combined with clinical observations, patient history, and epidemiological information. The expected result is Negative. Fact Sheet for Patients: HairSlick.nohttps://www.fda.gov/media/138098/download Fact Sheet for Healthcare Providers: quierodirigir.comhttps://www.fda.gov/media/138095/download This test is not yet approved or cleared by the [place] FDA and  has been authorized for detection and/or diagnosis of SARS-CoV-2 by FDA under an Emergency Use Authorization (EUA). This EUA will remain  in effect (meaning this test can be used) for the duration of the COVID-19  declaration under Section 56 4(b)(1) of the Act, 21 U.S.C. section 360bbb-3(b)(1), unless the authorization is terminated or revoked sooner. Performed at [facility], [address] [place], [place] [address]   MRSA PCR Screening     Status: None   Collection Time: 03/23/19  2:44 AM   Specimen: Nasal Mucosa; Nasopharyngeal  Result Value Ref Range Status   MRSA by PCR NEGATIVE NEGATIVE Final    Comment:        The GeneXpert MRSA Assay (FDA approved for NASAL specimens only), is one component of a comprehensive MRSA colonization surveillance program. It is not  intended to diagnose MRSA infection nor to guide or monitor treatment for MRSA infections. Performed at [facility], [address], [place], [place] [address]          Radiology Studies: Mr Cardiac Morphology W Wo Contrast  Result Date: 03/25/2019 CLINICAL DATA:  Heart failure suspected, acute MI, LV function eval during initial hospitalization COMPARISON: Echocardiogram 03/23/2019 EXAM: CARDIAC MRI TECHNIQUE: The patient was scanned on a 1.5 Tesla GE magnet. A dedicated cardiac coil was used. Functional imaging was done using Fiesta sequences. 2,3, and 4 chamber views were done to assess for RWMA's. Modified Simpson's rule using a short axis stack was used to calculate an ejection fraction on a dedicated work Research officer, trade union. The patient received 10 cc of Gadavist. After 10 minutes inversion recovery sequences were used to assess for infiltration and scar tissue. CONTRAST:  10 cc  of Gadavist FINDINGS: LEFT VENTRICLE: Severely reduced left ventricular systolic function. EF quantitation inaccurate due to motion artifact (patient did not breath hold), visually estimated EF 15%. Severe global hypokinesis. Mild LV dilation (LVEDD 55 mm). Normal wall thickness. Mild, nonspecific late gadolinium enhancement in the mid-myocardium of the basal left ventricular septal myocardium. No definite findings of  myocarditis, infarction, scar, or infiltrative/granulomatous process. Normal T1 myocardial nulling kinetics, suggesting against a diagnosis of cardiac amyloidosis. RIGHT VENTRICLE: Normal right ventricular size, thickness. Mildly reduced systolic function. ATRIA: Moderate left atrial enlargement, mild right atrial enlargement. VALVES: No significant valvular abnormalities, assessment limited by motion artifact. PERICARDIUM: Trivial-small circumferential pericardial effusion. Evidence of patchy delayed pericardial enhancement, primarily anterior-superior to the RV, suggesting possible pericarditis. No definite pericardial edema on T2 weighted images. OTHER: No significant extracardiac findings. MEASUREMENTS: Inaccurate due to significant motion artifact. IMPRESSION: 1. Severely reduced left ventricular systolic function with a visually estimated LVEF = 15%. Severe global hypokinesis. 2. Nonspecific, mild, mid-myocardial stripe of delayed enhancement in the basal ventricular septum. No definite findings to suggest myocarditis. 3. Patchy delayed enhancement of the pericardium with a trivial to small circumferential pericardial effusion, suggesting possible pericarditis. Electronically Signed   By: Weston Brass   On: 03/25/2019 16:41        Scheduled Meds: . digoxin  0.125 mg Oral Daily  . enoxaparin (LOVENOX) injection  40 mg Subcutaneous Q24H  . fentaNYL (SUBLIMAZE) injection  25 mcg Intravenous Once  . insulin aspart  0-15 Units Subcutaneous TID WC  . insulin aspart  0-5 Units Subcutaneous QHS  . insulin aspart  6 Units Subcutaneous TID WC  . insulin glargine  30 Units Subcutaneous Daily  . sacubitril-valsartan  1 tablet Oral BID  . sodium chloride flush  3 mL Intravenous Q12H  . sodium chloride flush  3 mL Intravenous Q12H  . spironolactone  25 mg Oral Daily   Continuous Infusions: . sodium chloride    . sodium chloride       LOS: 4 days     Jacquelin Hawking, MD [facility]  03/27/2019, 9:01 AM  If 7PM-7AM, please contact night-coverage [email or web address]

## 2019-03-27 NOTE — TOC Progression Note (Signed)
Transition of Care [facility]) - Progression Note    Patient Details  Name: Cheryl Mercer MRN: [id number] Date of Birth: 1986/08/13  Transition of Care [facility]) CM/SW [place], [place] Phone Number: 03/27/2019, 10:20 AM  Clinical Narrative:    CSW acknowledges consult for pt. CSW consulted with RNCM for medication request.  Patient can purchase cbg for around $10.00 at [facility]. Medication prescriptions will be $3.00 with insurance.  The scale can possibly be purchase at the CHF clinic Monday-Friday.        Expected Discharge Plan and Services                                                 Social Determinants of Health (SDOH) Interventions    Readmission Risk Interventions No flowsheet data found.

## 2019-03-27 NOTE — Plan of Care (Signed)
  Problem: Education: Goal: Ability to describe self-care measures that may prevent or decrease complications (Diabetes Survival Skills Education) will improve Outcome: Progressing Goal: Individualized Educational Video(s) Outcome: Progressing   Problem: Cardiac: Goal: Ability to maintain an adequate cardiac output will improve Outcome: Progressing   Problem: Health Behavior/Discharge Planning: Goal: Ability to identify and utilize available resources and services will improve Outcome: Progressing Goal: Ability to manage health-related needs will improve Outcome: Progressing   Problem: Fluid Volume: Goal: Ability to achieve a balanced intake and output will improve Outcome: Progressing   Problem: Metabolic: Goal: Ability to maintain appropriate glucose levels will improve Outcome: Progressing   Problem: Nutritional: Goal: Maintenance of adequate nutrition will improve Outcome: Progressing Goal: Maintenance of adequate weight for body size and type will improve Outcome: Progressing   Problem: Respiratory: Goal: Will regain and/or maintain adequate ventilation Outcome: Progressing   Problem: Urinary Elimination: Goal: Ability to achieve and maintain adequate renal perfusion and functioning will improve Outcome: Progressing   Problem: Education: Goal: Ability to demonstrate management of disease process will improve Outcome: Progressing Goal: Ability to verbalize understanding of medication therapies will improve Outcome: Progressing Goal: Individualized Educational Video(s) Outcome: Progressing   Problem: Activity: Goal: Capacity to carry out activities will improve Outcome: Progressing   Problem: Cardiac: Goal: Ability to achieve and maintain adequate cardiopulmonary perfusion will improve Outcome: Progressing   Problem: Health Behavior/Discharge Planning: Goal: Ability to manage health-related needs will improve Outcome: Progressing   Problem:  Education: Goal: Knowledge of General Education information will improve Description: Including pain rating scale, medication(s)/side effects and non-pharmacologic comfort measures Outcome: Progressing   Problem: Clinical Measurements: Goal: Ability to maintain clinical measurements within normal limits will improve Outcome: Progressing Goal: Will remain free from infection Outcome: Progressing Goal: Diagnostic test results will improve Outcome: Progressing Goal: Respiratory complications will improve Outcome: Progressing Goal: Cardiovascular complication will be avoided Outcome: Progressing   Problem: Activity: Goal: Risk for activity intolerance will decrease Outcome: Progressing   Problem: Nutrition: Goal: Adequate nutrition will be maintained Outcome: Progressing   Problem: Coping: Goal: Level of anxiety will decrease Outcome: Progressing   Problem: Elimination: Goal: Will not experience complications related to bowel motility Outcome: Progressing Goal: Will not experience complications related to urinary retention Outcome: Progressing   Problem: Pain Managment: Goal: General experience of comfort will improve Outcome: Progressing   Problem: Safety: Goal: Ability to remain free from injury will improve Outcome: Progressing   Problem: Skin Integrity: Goal: Risk for impaired skin integrity will decrease Outcome: Progressing

## 2019-03-27 NOTE — Progress Notes (Signed)
Advanced Heart Failure Rounding Note   Subjective:    Cath 8/7 No CAD. Well compensated hemodynamics with low filling pressures.   Feels good this am. HA resolved. SBP 110-120 on lower dose Entresto. Denies CP or SOB. Legs feel weak.   cMRI EF 15% No significant LGE   Objective:   Weight Range:  Vital Signs:   Temp:  [97.9 F (36.6 C)-98.3 F (36.8 C)] 98 F (36.7 C) (08/09 0300) Pulse Rate:  [101-118] 116 (08/09 0300) Resp:  [9-23] 10 (08/09 0300) BP: (85-132)/(63-96) 129/91 (08/09 0300) SpO2:  [98 %-100 %] 99 % (08/09 0300) Last BM Date: 03/26/19  Weight change: Filed Weights   03/23/19 0241 03/24/19 0318 03/25/19 0300  Weight: 72.8 kg 71 kg 67.4 kg    Intake/Output:   Intake/Output Summary (Last 24 hours) at 03/27/2019 0559 Last data filed at 03/27/2019 0300 Gross per 24 hour  Intake 1060 ml  Output 1600 ml  Net -540 ml     Physical Exam: General:  Well appearing. No resp difficulty HEENT: normal Neck: supple. no JVD. Carotids 2+ bilat; no bruits. No lymphadenopathy or thryomegaly appreciated. Cor: PMI nondisplaced. Regular rate & rhythm. No rubs, gallops or murmurs. Lungs: clear Abdomen: soft, nontender, nondistended. No hepatosplenomegaly. No bruits or masses. Good bowel sounds. Extremities: no cyanosis, clubbing, rash, edema Neuro: alert & orientedx3, cranial nerves grossly intact. moves all 4 extremities w/o difficulty. Affect pleasant   Telemetry: Stach 100-115 Personally reviewed   Labs: Basic Metabolic Panel: Recent Labs  Lab 03/23/19 0058 03/23/19 0734 03/24/19 0218 03/24/19 1611 03/25/19 0249 03/25/19 0915 03/25/19 0921 03/25/19 0922 03/25/19 1033 03/26/19 0514  NA 135 140 134*  --  134* 144 141 142  --  134*  K 3.5 3.3* 3.5 3.5 3.6 3.4* 3.7 3.5  --  4.8  CL 100 100 95*  --  95*  --   --   --   --  96*  CO2 24 29 27   --  26  --   --   --   --  28  GLUCOSE 439* 93 326*  --  435*  --   --   --   --  321*  BUN <5* 6 8  --  7  --    --   --   --  7  CREATININE 0.69 0.65 0.62  --  0.63  --   --   --  0.66 0.68  CALCIUM 8.3* 8.6* 8.3*  --  8.7*  --   --   --   --  9.1  MG  --   --   --  1.2* 1.7  --   --   --   --  1.6*    Liver Function Tests: Recent Labs  Lab 03/23/19 0053  AST 41  ALT 19  ALKPHOS 122  BILITOT 0.8  PROT 6.0*  ALBUMIN 2.8*   No results for input(s): LIPASE, AMYLASE in the last 168 hours. No results for input(s): AMMONIA in the last 168 hours.  CBC: Recent Labs  Lab 03/22/19 1659 03/25/19 0915 03/25/19 0921 03/25/19 0922 03/25/19 1033 03/26/19 0514  WBC 6.9  --   --   --  9.9 9.6  HGB 12.8 13.9 14.6 14.3 15.6* 17.0*  HCT 39.7 41.0 43.0 42.0 47.2* 51.2*  MCV 90.4  --   --   --  89.2 88.3  PLT 292  --   --   --  387 359  Cardiac Enzymes: No results for input(s): CKTOTAL, CKMB, CKMBINDEX, TROPONINI in the last 168 hours.  BNP: BNP (last 3 results) Recent Labs    02/04/19 1741 03/22/19 2154  BNP 1,418.4* 962.3*    ProBNP (last 3 results) No results for input(s): PROBNP in the last 8760 hours.    Other results:  Imaging: Mr Cardiac Morphology W Wo Contrast  Result Date: 03/25/2019 CLINICAL DATA:  Heart failure suspected, acute MI, LV function eval during initial hospitalization COMPARISON: Echocardiogram 03/23/2019 EXAM: CARDIAC MRI TECHNIQUE: The patient was scanned on a 1.5 Tesla GE magnet. A dedicated cardiac coil was used. Functional imaging was done using Fiesta sequences. 2,3, and 4 chamber views were done to assess for RWMA's. Modified Simpson's rule using a short axis stack was used to calculate an ejection fraction on a dedicated work Conservation officer, nature. The patient received 10 cc of Gadavist. After 10 minutes inversion recovery sequences were used to assess for infiltration and scar tissue. CONTRAST:  10 cc  of Gadavist FINDINGS: LEFT VENTRICLE: Severely reduced left ventricular systolic function. EF quantitation inaccurate due to motion artifact (patient  did not breath hold), visually estimated EF 15%. Severe global hypokinesis. Mild LV dilation (LVEDD 55 mm). Normal wall thickness. Mild, nonspecific late gadolinium enhancement in the mid-myocardium of the basal left ventricular septal myocardium. No definite findings of myocarditis, infarction, scar, or infiltrative/granulomatous process. Normal T1 myocardial nulling kinetics, suggesting against a diagnosis of cardiac amyloidosis. RIGHT VENTRICLE: Normal right ventricular size, thickness. Mildly reduced systolic function. ATRIA: Moderate left atrial enlargement, mild right atrial enlargement. VALVES: No significant valvular abnormalities, assessment limited by motion artifact. PERICARDIUM: Trivial-small circumferential pericardial effusion. Evidence of patchy delayed pericardial enhancement, primarily anterior-superior to the RV, suggesting possible pericarditis. No definite pericardial edema on T2 weighted images. OTHER: No significant extracardiac findings. MEASUREMENTS: Inaccurate due to significant motion artifact. IMPRESSION: 1. Severely reduced left ventricular systolic function with a visually estimated LVEF = 15%. Severe global hypokinesis. 2. Nonspecific, mild, mid-myocardial stripe of delayed enhancement in the basal ventricular septum. No definite findings to suggest myocarditis. 3. Patchy delayed enhancement of the pericardium with a trivial to small circumferential pericardial effusion, suggesting possible pericarditis. Electronically Signed   By: Cherlynn Kaiser   On: 03/25/2019 16:41     Medications:     Scheduled Medications:  digoxin  0.125 mg Oral Daily   enoxaparin (LOVENOX) injection  40 mg Subcutaneous Q24H   fentaNYL (SUBLIMAZE) injection  25 mcg Intravenous Once   insulin aspart  0-15 Units Subcutaneous TID WC   insulin aspart  0-5 Units Subcutaneous QHS   insulin aspart  6 Units Subcutaneous TID WC   insulin glargine  25 Units Subcutaneous Daily   sacubitril-valsartan   1 tablet Oral BID   sodium chloride flush  3 mL Intravenous Q12H   sodium chloride flush  3 mL Intravenous Q12H   spironolactone  25 mg Oral Daily    Infusions:  sodium chloride     sodium chloride      PRN Medications: sodium chloride, sodium chloride, acetaminophen, cyclobenzaprine, hydrALAZINE, ondansetron **OR** ondansetron (ZOFRAN) IV, sodium chloride flush, sodium chloride flush, traMADol   Assessment/Plan:   1. Acute systolic HF  - Echo showed severely dilated LV with EF of 10-15% and grade 3 diastolic dysfunction. RV systolic function also noted to be moderately reduced - Cath 8/7 No CAD - cmRI 8/7 EF 15% no significant LGE - Volume status much improved. Now off diuretics. On Entresto and spiro. Can add  back low-dose lasix as needed.  - Continue Entresto 24/26 Continue digoxin. Level ok. Continue spiro 25. 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 No significant scar on MRI so hopeful with LV recovery with control of HTN/DM.   2. Chest pain - troponin negative - Cath no CAD  3. HTN - BP improved on Entresto.   4. DM2 - poorly controlled. Primary team managing - eventually will need SGLT2i  5. Hypokalemia/Hypomag - supp  Ambulate today. If stable. Possible d/c tomorrow.   Length of Stay: 4   Arvilla Meres, MD 03/27/2019, 5:59 AM  Advanced Heart Failure Team Pager [phone] (M-F; 7a - 4p)  Please contact [facility] for night-coverage after hours (4p -7a ) and weekends on [email or web address]

## 2019-03-28 LAB — BASIC METABOLIC PANEL
Anion gap: 10 (ref 5–15)
BUN: 7 mg/dL (ref 6–20)
CO2: 24 mmol/L (ref 22–32)
Calcium: 8.7 mg/dL — ABNORMAL LOW (ref 8.9–10.3)
Chloride: 99 mmol/L (ref 98–111)
Creatinine, Ser: 0.47 mg/dL (ref 0.44–1.00)
GFR calc Af Amer: >60 mL/min (ref >60)
GFR calc non Af Amer: >60 mL/min (ref >60)
Glucose, Bld: 341 mg/dL — ABNORMAL HIGH (ref 70–99)
Potassium: 4.6 mmol/L (ref 3.5–5.1)
Sodium: 133 mmol/L — ABNORMAL LOW (ref 135–145)

## 2019-03-28 LAB — CBC
HCT: 47.4 % — ABNORMAL HIGH (ref 36.0–46.0)
Hemoglobin: 15.5 g/dL — ABNORMAL HIGH (ref 12.0–15.0)
MCH: 29 pg (ref 26.0–34.0)
MCHC: 32.7 g/dL (ref 30.0–36.0)
MCV: 88.8 fL (ref 80.0–100.0)
Platelets: 332 10*3/uL (ref 150–400)
RBC: 5.34 MIL/uL — ABNORMAL HIGH (ref 3.87–5.11)
RDW: 12.8 % (ref 11.5–15.5)
WBC: 8.3 10*3/uL (ref 4.0–10.5)
nRBC: 0 % (ref 0.0–0.2)

## 2019-03-28 LAB — GLUCOSE, CAPILLARY
Glucose-Capillary: 317 mg/dL — ABNORMAL HIGH (ref 70–99)
Glucose-Capillary: 315 mg/dL — ABNORMAL HIGH (ref 70–99)

## 2019-03-28 LAB — MAGNESIUM: Magnesium: 1.7 mg/dL (ref 1.7–2.4)

## 2019-03-28 MED ORDER — INSULIN GLARGINE 100 UNIT/ML SOLOSTAR PEN: 40.0000 [IU] | PEN_INJECTOR | Freq: Every day | SUBCUTANEOUS | 0 refills | Status: DC

## 2019-03-28 MED ORDER — EMPAGLIFLOZIN 10 MG PO TABS: 10.0000 mg | ORAL_TABLET | Freq: Every day | ORAL | 0 refills | Status: DC

## 2019-03-28 MED ORDER — INSULIN PEN NEEDLE 31G X 5 MM MISC: 3 refills | Status: DC

## 2019-03-28 MED ORDER — POTASSIUM CHLORIDE ER 10 MEQ PO TBCR: 10.0000 meq | EXTENDED_RELEASE_TABLET | Freq: Two times a day (BID) | ORAL | 0 refills | Status: DC

## 2019-03-28 MED ORDER — DIGOXIN 125 MCG PO TABS: 0.1250 mg | ORAL_TABLET | Freq: Every day | ORAL | 0 refills | Status: DC

## 2019-03-28 MED ORDER — FUROSEMIDE 20 MG PO TABS: 20.0000 mg | ORAL_TABLET | Freq: Every day | ORAL | 0 refills | Status: DC

## 2019-03-28 MED ORDER — NOVOLOG FLEXPEN 100 UNIT/ML ~~LOC~~ SOPN: 10.0000 [IU] | PEN_INJECTOR | Freq: Three times a day (TID) | SUBCUTANEOUS | 0 refills | Status: DC

## 2019-03-28 MED ORDER — ATORVASTATIN CALCIUM 40 MG PO TABS: 40.0000 mg | ORAL_TABLET | Freq: Every day | ORAL | Status: DC

## 2019-03-28 MED ORDER — SPIRONOLACTONE 25 MG PO TABS: 25.0000 mg | ORAL_TABLET | Freq: Every day | ORAL | 0 refills | Status: DC

## 2019-03-28 MED ORDER — ATORVASTATIN CALCIUM 40 MG PO TABS: 40.0000 mg | ORAL_TABLET | Freq: Every day | ORAL | 0 refills | Status: DC

## 2019-03-28 MED ORDER — SACUBITRIL-VALSARTAN 24-26 MG PO TABS: 1.0000 | ORAL_TABLET | Freq: Two times a day (BID) | ORAL | 0 refills | Status: DC

## 2019-03-28 MED ORDER — METFORMIN HCL 500 MG PO TABS: 500.0000 mg | ORAL_TABLET | Freq: Two times a day (BID) | ORAL | 0 refills | Status: DC

## 2019-03-28 MED ORDER — BLOOD GLUCOSE METER KIT: PACK | 0 refills | Status: DC

## 2019-03-28 MED FILL — NOVOLOG FLEXPEN SYRINGE: 100 | 50 days supply | Qty: 15 | Fill #0

## 2019-03-28 MED FILL — SPIRONOLACTONE 25 MG TABLET: 25 | 30 days supply | Qty: 30 | Fill #0

## 2019-03-28 MED FILL — FUROSEMIDE 20 MG TAB: 20 | 30 days supply | Qty: 30 | Fill #0

## 2019-03-28 MED FILL — DIGOXIN 0.125 MG TABLET: 125 | 30 days supply | Qty: 30 | Fill #0

## 2019-03-28 MED FILL — JARDIANCE 10 MG TABLET: 10 | 30 days supply | Qty: 30 | Fill #0

## 2019-03-28 MED FILL — metFORMIN HCL 500 MG TABS: 500 | 30 days supply | Qty: 60 | Fill #0

## 2019-03-28 MED FILL — PENTIPS 31G X 8 MM MISC: 31G X 8 MM | 30 days supply | Qty: 200 | Fill #0

## 2019-03-28 MED FILL — LANTUS SOLOSTAR 100 UNITS/M: 100 | 37 days supply | Qty: 15 | Fill #0

## 2019-03-28 MED FILL — ENTRESTO 24 MG-26 MG TABLET: 24-26 | 30 days supply | Qty: 60 | Fill #0

## 2019-03-28 MED FILL — ATORVASTATIN CALCIUM 40 MG: 40 | 30 days supply | Qty: 30 | Fill #0

## 2019-03-28 NOTE — Progress Notes (Signed)
Advanced Heart Failure Rounding Note   Subjective:    Cath 8/7 No CAD. Well compensated hemodynamics with low filling pressures.   Feels good. Ready to go home. Walking halls. No orthopnea or PND. SBP 100-120   cMRI EF 15% No significant LGE   Objective:   Weight Range:  Vital Signs:   Temp:  [97.7 F (36.5 C)-98.2 F (36.8 C)] 98.1 F (36.7 C) (08/10 0722) Pulse Rate:  [58-120] 119 (08/10 0722) Resp:  [13-26] 16 (08/10 0722) BP: (101-136)/(67-88) 121/83 (08/10 0722) SpO2:  [98 %-100 %] 100 % (08/10 0722) Last BM Date: 03/26/19  Weight change: Filed Weights   03/23/19 0241 03/24/19 0318 03/25/19 0300  Weight: 72.8 kg 71 kg 67.4 kg    Intake/Output:   Intake/Output Summary (Last 24 hours) at 03/28/2019 0833 Last data filed at 03/28/2019 0500 Gross per 24 hour  Intake 1440 ml  Output -  Net 1440 ml     Physical Exam: General:  Well appearing. No resp difficulty HEENT: normal Neck: supple. no JVD. Carotids 2+ bilat; no bruits. No lymphadenopathy or thryomegaly appreciated. Cor: PMI nondisplaced. Tachy regular Lungs: clear Abdomen: soft, nontender, nondistended. No hepatosplenomegaly. No bruits or masses. Good bowel sounds. Extremities: no cyanosis, clubbing, rash, edema Neuro: alert & orientedx3, cranial nerves grossly intact. moves all 4 extremities w/o difficulty. Affect pleasant    Telemetry: Stach 100-120 Personally reviewed   Labs: Basic Metabolic Panel: Recent Labs  Lab 03/23/19 0734 03/24/19 0218 03/24/19 1611 03/25/19 0249 03/25/19 0915 03/25/19 0921 03/25/19 0922 03/25/19 1033 03/26/19 0514 03/28/19 0300  NA 140 134*  --  134* 144 141 142  --  134* 133*  K 3.3* 3.5 3.5 3.6 3.4* 3.7 3.5  --  4.8 4.6  CL 100 95*  --  95*  --   --   --   --  96* 99  CO2 29 27  --  26  --   --   --   --  28 24  GLUCOSE 93 326*  --  435*  --   --   --   --  321* 341*  BUN 6 8  --  7  --   --   --   --  7 7  CREATININE 0.65 0.62  --  0.63  --   --   --   0.66 0.68 0.47  CALCIUM 8.6* 8.3*  --  8.7*  --   --   --   --  9.1 8.7*  MG  --   --  1.2* 1.7  --   --   --   --  1.6* 1.7    Liver Function Tests: Recent Labs  Lab 03/23/19 0053  AST 41  ALT 19  ALKPHOS 122  BILITOT 0.8  PROT 6.0*  ALBUMIN 2.8*   No results for input(s): LIPASE, AMYLASE in the last 168 hours. No results for input(s): AMMONIA in the last 168 hours.  CBC: Recent Labs  Lab 03/22/19 1659  03/25/19 0921 03/25/19 0922 03/25/19 1033 03/26/19 0514 03/28/19 0300  WBC 6.9  --   --   --  9.9 9.6 8.3  HGB 12.8   < > 14.6 14.3 15.6* 17.0* 15.5*  HCT 39.7   < > 43.0 42.0 47.2* 51.2* 47.4*  MCV 90.4  --   --   --  89.2 88.3 88.8  PLT 292  --   --   --  387 359 332   < > =  values in this interval not displayed.    Cardiac Enzymes: No results for input(s): CKTOTAL, CKMB, CKMBINDEX, TROPONINI in the last 168 hours.  BNP: BNP (last 3 results) Recent Labs    02/04/19 1741 03/22/19 2154  BNP 1,418.4* 962.3*    ProBNP (last 3 results) No results for input(s): PROBNP in the last 8760 hours.    Other results:  Imaging: No results found.   Medications:     Scheduled Medications: . digoxin  0.125 mg Oral Daily  . enoxaparin (LOVENOX) injection  40 mg Subcutaneous Q24H  . insulin aspart  0-15 Units Subcutaneous TID WC  . insulin aspart  0-5 Units Subcutaneous QHS  . insulin aspart  10 Units Subcutaneous TID WC  . insulin glargine  30 Units Subcutaneous Daily  . sacubitril-valsartan  1 tablet Oral BID  . sodium chloride flush  3 mL Intravenous Q12H  . sodium chloride flush  3 mL Intravenous Q12H  . spironolactone  25 mg Oral Daily    Infusions: . sodium chloride    . sodium chloride      PRN Medications: sodium chloride, sodium chloride, acetaminophen, cyclobenzaprine, hydrALAZINE, ondansetron **OR** ondansetron (ZOFRAN) IV, sodium chloride flush, sodium chloride flush, traMADol   Assessment/Plan:   1. Acute systolic HF  - Echo showed  severely dilated LV with EF of 10-15% and grade 3 diastolic dysfunction. RV systolic function also noted to be moderately reduced - Cath 8/7 No CAD - cmRI 8/7 EF 15% no significant LGE - Volume status much improved. Now off diuretics. On Entresto and spiro.  - Continue Entresto 24/26 Continue digoxin. Level ok. Continue spiro 25. 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 No significant scar on MRI so hopeful with LV recovery with control of HTN/DM.   2. Chest pain - troponin negative - Cath no CAD  3. HTN - BP improved on Entresto.   4. DM2 - poorly controlled. Primary team managing - eventually will need SGLT2i  5. Hypokalemia/Hypomag - supp   Ok to go home today on following HF meds.  Entresto 24/26 bid Digoxin 0.125 daily Spiro 25 daily Lasix 20 mg daily Atorva 40  No b-blocker yet  F/u I n HF Clinic 8/18    Length of Stay: 5   Arvilla Meres, MD 03/28/2019, 8:33 AM  Advanced Heart Failure Team Pager [phone] (M-F; 7a - 4p)  Please contact [facility] for night-coverage after hours (4p -7a ) and weekends on [email or web address]

## 2019-03-28 NOTE — TOC Transition Note (Signed)
Transition of Care [facility]) - CM/SW Discharge Note   Patient Details  Name: Cheryl Mercer MRN: [id number] Date of Birth: 01/23/1986  [facility] CM/SW Contact:  Zenon Mayo, RN Phone Number: 03/28/2019, 1:44 PM   Clinical Narrative:    Patient for dc today, she has Medicaid insurance co pay is 3.00, will be on [place], [place] filled the first 30 day free for her. She also needs a scale, NCM will call [facility] with HF and see if can mail scale to her.    Final next level of care: Home/Self Care Barriers to Discharge: No Barriers Identified   Patient Goals and CMS Choice Patient states their goals for this hospitalization and ongoing recovery are:: go home   Choice offered to / list presented to : NA  Discharge Placement                       Discharge Plan and Services                          HH Arranged: NA          Social Determinants of Health (SDOH) Interventions     Readmission Risk Interventions No flowsheet data found.

## 2019-03-28 NOTE — Progress Notes (Signed)
Waiting for pharmacy to fill prescriptions

## 2019-03-28 NOTE — Progress Notes (Signed)
Inpatient Diabetes Program Recommendations  AACE/ADA: New Consensus Statement on Inpatient Glycemic Control (2015)  Target Ranges:  Prepandial:   less than 140 mg/dL      Peak postprandial:   less than 180 mg/dL (1-2 hours)      Critically ill patients:  140 - 180 mg/dL   Lab Results  Component Value Date   GLUCAP 317 (H) 03/28/2019   HGBA1C 13.9 (H) 03/23/2019    Review of Glycemic Control Results for SYLVANNA, BURGGRAF (MRN [id number]) as of 03/28/2019 10:17  Ref. Range 03/27/2019 16:20 03/27/2019 17:04 03/27/2019 21:16 03/28/2019 05:44  Glucose-Capillary Latest Ref Range: 70 - 99 mg/dL 213 (H) 270 (H) 196 (H) 317 (H)   Diabetes history:Type 2 DM Outpatient Diabetes medications:metformin 500 mg BID Current orders for Inpatient glycemic control:Novolog 0-15 units TID, Lantus 30 units QD, Novolog 10 units TID, Novolog 0-5 units QHS  Inpatient Diabetes Program Recommendations:    Consider increasing Lantus to 40 units QD.   Thanks, Bronson Curb, MSN, RNC-OB Diabetes Coordinator [phone] (8a-5p)

## 2019-03-28 NOTE — Discharge Instructions (Signed)
Cheryl Mercer,  You are in the hospital with shortness of breath and leg swelling found to have heart failure.  You have been started on many medications and will need to follow-up with the heart failure clinic.  He also has significant diabetes.  He was started on insulin.  This will likely have to be titrated/adjusted as an outpatient with your primary care doctor.   Hemoglobin A1c Test Why am I having this test? You may have the hemoglobin A1c test (HbA1c test) done to:  Evaluate your risk for developing diabetes (diabetes mellitus).  Diagnose diabetes.  Monitor long-term control of blood sugar (glucose) in people who have diabetes and help make treatment decisions. This test may be done with other blood glucose tests, such as fasting blood glucose and oral glucose tolerance tests. What is being tested? Hemoglobin is a type of protein in the blood that carries oxygen. Glucose attaches to hemoglobin to form glycated hemoglobin. This test checks the amount of glycated hemoglobin in your blood, which is a good indicator of the average amount of glucose in your blood during the past 2-3 months. What kind of sample is taken?  A blood sample is required for this test. It is usually collected by inserting a needle into a blood vessel. Tell a health care provider about:  All medicines you are taking, including vitamins, herbs, eye drops, creams, and over-the-counter medicines.  Any blood disorders you have.  Any surgeries you have had.  Any medical conditions you have.  Whether you are pregnant or may be pregnant. How are the results reported? Your results will be reported as a percentage that indicates how much of your hemoglobin has glucose attached to it (is glycated). Your health care provider will compare your results to normal ranges that were established after testing a large group of people (reference ranges). Reference ranges may vary among labs and hospitals. For this  test, common reference ranges are:  Adult or child without diabetes: 4-5.6%.  Adult or child with diabetes and good blood glucose control: less than 7%. What do the results mean? If you have diabetes:  A result of less than 7% is considered normal, meaning that your blood glucose is well controlled.  A result higher than 7% means that your blood glucose is not well controlled, and your treatment plan may need to be adjusted. If you do not have diabetes:  A result within the reference range is considered normal, meaning that you are not at high risk for diabetes.  A result of 5.7-6.4% means that you have a high risk of developing diabetes, and you may have prediabetes. Prediabetes is the condition of having a blood glucose level that is higher than it should be, but not high enough for you to be diagnosed with diabetes. Having prediabetes puts you at risk for developing type 2 diabetes (type 2 diabetes mellitus). You may have more tests, including a repeat HbA1c test.  Results of 6.5% or higher on two separate HbA1c tests mean that you have diabetes. You may have more tests to confirm the diagnosis. Abnormally low HbA1c values may be caused by:  Pregnancy.  Severe blood loss.  Receiving donated blood (transfusions).  Low red blood cell count (anemia).  Long-term kidney failure.  Some unusual forms (variants) of hemoglobin. Talk with your health care provider about what your results mean. Questions to ask your health care provider Ask your health care provider, or the department that is doing the test:  When  will my results be ready?  How will I get my results?  What are my treatment options?  What other tests do I need?  What are my next steps? Summary  The hemoglobin A1c test (HbA1c test) may be done to evaluate your risk for developing diabetes, to diagnose diabetes, and to monitor long-term control of blood sugar (glucose) in people who have diabetes and help make  treatment decisions.  Hemoglobin is a type of protein in the blood that carries oxygen. Glucose attaches to hemoglobin to form glycated hemoglobin. This test checks the amount of glycated hemoglobin in your blood, which is a good indicator of the average amount of glucose in your blood during the past 2-3 months.  Talk with your health care provider about what your results mean. This information is not intended to replace advice given to you by your health care provider. Make sure you discuss any questions you have with your health care provider. Document Released: 08/26/2004 Document Revised: 07/17/2017 Document Reviewed: 03/17/2017 Elsevier Patient Education  [place]. Hypoglycemia Hypoglycemia is when the sugar (glucose) level in your blood is too low. Signs of low blood sugar may include:  Feeling: ? Hungry. ? Worried or nervous (anxious). ? Sweaty and clammy. ? Confused. ? Dizzy. ? Sleepy. ? Sick to your stomach (nauseous).  Having: ? A fast heartbeat. ? A headache. ? A change in your vision. ? Tingling or no feeling (numbness) around your mouth, lips, or tongue. ? Jerky movements that you cannot control (seizure).  Having trouble with: ? Moving (coordination). ? Sleeping. ? Passing out (fainting). ? Getting upset easily (irritability). Low blood sugar can happen to people who have diabetes and people who do not have diabetes. Low blood sugar can happen quickly, and it can be an emergency. Treating low blood sugar Low blood sugar is often treated by eating or drinking something sugary right away, such as:  Fruit juice, 4-6 oz (120-150 mL).  Regular soda (not diet soda), 4-6 oz (120-150 mL).  Low-fat milk, 4 oz (120 mL).  Several pieces of hard candy.  Sugar or honey, 1 Tbsp (15 mL). Treating low blood sugar if you have diabetes If you can think clearly and swallow safely, follow the 15:15 rule:  Take 15 grams of a fast-acting carb (carbohydrate). Talk  with your doctor about how much you should take.  Always keep a source of fast-acting carb with you, such as: ? Sugar tablets (glucose pills). Take 3-4 pills. ? 6-8 pieces of hard candy. ? 4-6 oz (120-150 mL) of fruit juice. ? 4-6 oz (120-150 mL) of regular (not diet) soda. ? 1 Tbsp (15 mL) honey or sugar.  Check your blood sugar 15 minutes after you take the carb.  If your blood sugar is still at or below 70 mg/dL (3.9 mmol/L), take 15 grams of a carb again.  If your blood sugar does not go above 70 mg/dL (3.9 mmol/L) after 3 tries, get help right away.  After your blood sugar goes back to normal, eat a meal or a snack within 1 hour.  Treating very low blood sugar If your blood sugar is at or below 54 mg/dL (3 mmol/L), you have very low blood sugar (severe hypoglycemia). This may also cause:  Passing out.  Jerky movements you cannot control (seizure).  Losing consciousness (coma). This is an emergency. Do not wait to see if the symptoms will go away. Get medical help right away. Call your local emergency services (911 in  the [place] Do not drive yourself to the hospital. If you have very low blood sugar and you cannot eat or drink, you may need a glucagon shot (injection). A family member or friend should learn how to check your blood sugar and how to give you a glucagon shot. Ask your doctor if you need to have a glucagon shot kit at home. Follow these instructions at home: General instructions  Take over-the-counter and prescription medicines only as told by your doctor.  Stay aware of your blood sugar as told by your doctor.  Limit alcohol intake to no more than 1 drink a day for nonpregnant women and 2 drinks a day for men. One drink equals 12 oz of beer (355 mL), 5 oz of wine (148 mL), or 1 oz of hard liquor (44 mL).  Keep all follow-up visits as told by your doctor. This is important. If you have diabetes:   Follow your diabetes care plan as told by your doctor. Make  sure you: ? Know the signs of low blood sugar. ? Take your medicines as told. ? Follow your exercise and meal plan. ? Eat on time. Do not skip meals. ? Check your blood sugar as often as told by your doctor. Always check it before and after exercise. ? Follow your sick day plan when you cannot eat or drink normally. Make this plan ahead of time with your doctor.  Share your diabetes care plan with: ? Your work or school. ? People you live with.  Check your pee (urine) for ketones: ? When you are sick. ? As told by your doctor.  Carry a card or wear jewelry that says you have diabetes. Contact a doctor if:  You have trouble keeping your blood sugar in your target range.  You have low blood sugar often. Get help right away if:  You still have symptoms after you eat or drink something sugary.  Your blood sugar is at or below 54 mg/dL (3 mmol/L).  You have jerky movements that you cannot control.  You pass out. These symptoms may be an emergency. Do not wait to see if the symptoms will go away. Get medical help right away. Call your local emergency services (911 in the [place] Do not drive yourself to the hospital. Summary  Hypoglycemia happens when the level of sugar (glucose) in your blood is too low.  Low blood sugar can happen to people who have diabetes and people who do not have diabetes. Low blood sugar can happen quickly, and it can be an emergency.  Make sure you know the signs of low blood sugar and know how to treat it.  Always keep a source of sugar (fast-acting carb) with you to treat low blood sugar. This information is not intended to replace advice given to you by your health care provider. Make sure you discuss any questions you have with your health care provider. Document Released: 10/29/2009 Document Revised: 11/25/2018 Document Reviewed: 09/07/2015 Elsevier Patient Education  [place]. Hyperglycemia Hyperglycemia occurs when the level of sugar  (glucose) in the blood is too high. Glucose is a type of sugar that provides the body's main source of energy. Certain hormones (insulin and glucagon) control the level of glucose in the blood. Insulin lowers blood glucose, and glucagon increases blood glucose. Hyperglycemia can result from having too little insulin in the bloodstream, or from the body not responding normally to insulin. Hyperglycemia occurs most often in people who have diabetes (diabetes mellitus),  but it can happen in people who do not have diabetes. It can develop quickly, and it can be life-threatening if it causes you to become severely dehydrated (diabetic ketoacidosis or hyperglycemic hyperosmolar state). Severe hyperglycemia is a medical emergency. What are the causes? If you have diabetes, hyperglycemia may be caused by:  Diabetes medicine.  Medicines that increase blood glucose or affect your diabetes control.  Not eating enough, or not eating often enough.  Changes in physical activity level.  Being sick or having an infection. If you have prediabetes or undiagnosed diabetes:  Hyperglycemia may be caused by those conditions. If you do not have diabetes, hyperglycemia may be caused by:  Certain medicines, including steroid medicines, beta-blockers, epinephrine, and thiazide diuretics.  Stress.  Serious illness.  Surgery.  Diseases of the pancreas.  Infection. What increases the risk? Hyperglycemia is more likely to develop in people who have risk factors for diabetes, such as:  Having a family member with diabetes.  Having a gene for type 1 diabetes that is passed from parent to child (inherited).  Living in an area with cold weather conditions.  Exposure to certain viruses.  Certain conditions in which the body's disease-fighting (immune) system attacks itself (autoimmune disorders).  Being overweight or obese.  Having an inactive (sedentary) lifestyle.  Having been diagnosed with insulin  resistance.  Having a history of prediabetes, gestational diabetes, or polycystic ovarian syndrome (PCOS).  Being of American-Indian, African-American, Hispanic/Latino, or Asian/Pacific Islander descent. What are the signs or symptoms? Hyperglycemia may not cause any symptoms. If you do have symptoms, they may include early warning signs, such as:  Increased thirst.  Hunger.  Feeling very tired.  Needing to urinate more often than usual.  Blurry vision. Other symptoms may develop if hyperglycemia gets worse, such as:  Dry mouth.  Loss of appetite.  Fruity-smelling breath.  Weakness.  Unexpected or rapid weight gain or weight loss.  Tingling or numbness in the hands or feet.  Headache.  Skin that does not quickly return to normal after being lightly pinched and released (poor skin turgor).  Abdominal pain.  Cuts or bruises that are slow to heal. How is this diagnosed? Hyperglycemia is diagnosed with a blood test to measure your blood glucose level. This blood test is usually done while you are having symptoms. Your health care provider may also do a physical exam and review your medical history. You may have more tests to determine the cause of your hyperglycemia, such as:  A fasting blood glucose (FBG) test. You will not be allowed to eat (you will fast) for at least 8 hours before a blood sample is taken.  An A1c (hemoglobin A1c) blood test. This provides information about blood glucose control over the previous 2-3 months.  An oral glucose tolerance test (OGTT). This measures your blood glucose at two times: ? After fasting. This is your baseline blood glucose level. ? Two hours after drinking a beverage that contains glucose. How is this treated? Treatment depends on the cause of your hyperglycemia. Treatment may include:  Taking medicine to regulate your blood glucose levels. If you take insulin or other diabetes medicines, your medicine or dosage may be  adjusted.  Lifestyle changes, such as exercising more, eating healthier foods, or losing weight.  Treating an illness or infection, if this caused your hyperglycemia.  Checking your blood glucose more often.  Stopping or reducing steroid medicines, if these caused your hyperglycemia. If your hyperglycemia becomes severe and it results in  hyperglycemic hyperosmolar state, you must be hospitalized and given IV fluids. Follow these instructions at home:  General instructions  Take over-the-counter and prescription medicines only as told by your health care provider.  Do not use any products that contain nicotine or tobacco, such as cigarettes and e-cigarettes. If you need help quitting, ask your health care provider.  Limit alcohol intake to no more than 1 drink per day for nonpregnant women and 2 drinks per day for men. One drink equals 12 oz of beer, 5 oz of wine, or 1 oz of hard liquor.  Learn to manage stress. If you need help with this, ask your health care provider.  Keep all follow-up visits as told by your health care provider. This is important. Eating and drinking   Maintain a healthy weight.  Exercise regularly, as directed by your health care provider.  Stay hydrated, especially when you exercise, get sick, or spend time in hot temperatures.  Eat healthy foods, such as: ? Lean proteins. ? Complex carbohydrates. ? Fresh fruits and vegetables. ? Low-fat dairy products. ? Healthy fats.  Drink enough fluid to keep your urine clear or pale yellow. If you have diabetes:  Make sure you know the symptoms of hyperglycemia.  Follow your diabetes management plan, as told by your health care provider. Make sure you: ? Take your insulin and medicines as directed. ? Follow your exercise plan. ? Follow your meal plan. Eat on time, and do not skip meals. ? Check your blood glucose as often as directed. Make sure to check your blood glucose before and after exercise. If you  exercise longer or in a different way than usual, check your blood glucose more often. ? Follow your sick day plan whenever you cannot eat or drink normally. Make this plan in advance with your health care provider.  Share your diabetes management plan with people in your workplace, school, and household.  Check your urine for ketones when you are ill and as told by your health care provider.  Carry a medical alert card or wear medical alert jewelry. Contact a health care provider if:  Your blood glucose is at or above 240 mg/dL (13.3 mmol/L) for 2 days in a row.  You have problems keeping your blood glucose in your target range.  You have frequent episodes of hyperglycemia. Get help right away if:  You have difficulty breathing.  You have a change in how you think, feel, or act (mental status).  You have nausea or vomiting that does not go away. These symptoms may represent a serious problem that is an emergency. Do not wait to see if the symptoms will go away. Get medical help right away. Call your local emergency services (911 in the [place] Do not drive yourself to the hospital. Summary  Hyperglycemia occurs when the level of sugar (glucose) in the blood is too high.  Hyperglycemia is diagnosed with a blood test to measure your blood glucose level. This blood test is usually done while you are having symptoms. Your health care provider may also do a physical exam and review your medical history.  If you have diabetes, follow your diabetes management plan as told by your health care provider.  Contact your health care provider if you have problems keeping your blood glucose in your target range. This information is not intended to replace advice given to you by your health care provider. Make sure you discuss any questions you have with your health care provider. Document  Released: 01/28/2001 Document Revised: 04/21/2016 Document Reviewed: 04/21/2016 Elsevier Patient Education   2020 [facility] [phone]) 1. Dr. Philemon Kingdom 2. Dr. Janie Morning [facility] [phone]) 1. Dr. Delrae Rend [facility] [phone]) 1. Dr. Jacelyn Pi 2. Dr. Anda Kraft [facility] [phone]) 1. Dr. Daneil Dolin [facility] [phone]) [ office]  [phone]) [[place] office] 1. Dr. Lenna Sciara [place] 2. Dr. Judithann Sheen [facility]) [phone]) 1. Autumn Hudnall Ronnald Ramp), PA 2. Dr. Amalia Greenhouse 3. Dr. Marsh Dolly. [facility] [phone]) 1. Dr. Glade Lloyd [facility] [phone]) 1. Dr. Orville Govern 2. Dr. Lelon Huh 3. Dr. Jerelene Redden 4. Alwyn Ren, FNP Dr. Carolynn Serve. [place] [facility] [place] [phone])

## 2019-03-28 NOTE — Progress Notes (Signed)
Discharge instructions reviewed and questions answered. Patient verbalizes understanding of medications and the needed follow uo.  Patient via wheelchair to mothers waiting  Car with medications filled by pharmacy

## 2019-03-28 NOTE — Discharge Summary (Signed)
Physician Discharge Summary  Cheryl Mercer MAY:[id number] DOB: March 05, 1986 DOA: 03/22/2019  PCP: Lorelee Market, MD  Admit date: 03/22/2019 Discharge date: 03/28/2019  Admitted From: Home Disposition: Home  Recommendations for Outpatient Follow-up:  1. Follow up with PCP in 1 week 2. Follow up with heart failure clinic 3. Follow up dietician 4. Please obtain BMP/CBC in one week 5. Please follow up on the following pending results: None  Home Health: None Equipment/Devices: Glucose meter  Discharge Condition: Stable CODE STATUS: Full code Diet recommendation: Heart healthy, carb modified   Brief/Interim Summary:  Admission HPI written by Etta Quill, DO   Chief Complaint: Leg swelling  HPI: Cheryl Mercer is a 33 y.o. female with medical history significant of DM2 on metformin, HTN not currently taking meds.  Patient presents to the ED with c/o BLE edema and SOB.  Onset about 1 month ago with progressive worsening since then.  Especially worse over past 1 week.  Today when she was walking up the stairs she states her legs felt so heavy that she fell and landed on her right knee. Shehas been ambulatory since the fall but states her right kneeaches. She did not hit her head, no LOC. Shehas been wearing compression stockings, soaking her feet in Epsom salt and elevating her feet without any changes in the swelling.  Associated DOE.  No fevers, chills, cough, CP.     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  Nonischemic cardiomyopathy.  Echocardiogram obtained which was significant for an EF of 15%.  Heart failure team was consulted.  Patient started on IV diuresis with Lasix 40 mg twice daily with good symptomatic improvement of heart failure symptoms.  Left and right heart cath was performed on August 7 which was significant for no significant CAD.  Cardiac MRI was significant for an EF of 15%  with global hypokinesis with no evidence of myocarditis.  Patient started on Entresto, spironolactone, digoxin, lasix PO.  Patient also started on atorvastatin.  LDL is 95.  Patient to follow-up with heart failure as an outpatient.  Diabetes mellitus, type II Uncontrolled with hyperglycemia.  Hemoglobin A1c of 13.9%.  Patient was on metformin therapy as an outpatient.  Patient required the initiation of subcutaneous insulin therapy.  Patient was titrated on Lantus and NovoLog.  Blood sugar still uncontrolled but no signs of developing acidosis.  Patient will be discharged on Lantus 40 units daily in addition to NovoLog 10 units 3 times daily with meals.  Patient discharged with prescriptions for diabetic supplies, insulin pens, insulin needles, dietitian follow-up.  Patient also discharged on Jardiance secondary to concomitant cardiomyopathy.  Patient will need close follow-up and management of her diabetes as an outpatient.  Essential hypertension Associated hypertensive urgency initially which improved with initiation of antihypertensives.  Patient developed some slight hypotension secondary to diuresis and antihypertensive.  Lasix stopped at that time with improvement of blood pressures.  Patient will be discharged on Entresto and spironolactone.   Proteinuria In setting of diabetes mellitus.  Associated hypoalbuminemia.  24-hour protein collection started on 8/5. 24 hour protein of 900 mg. Will benefit from [place]. Outpatient follow-up.  Tachycardia Sinus tachycardia. Asymptomatic. In setting of heart failure. Cardiology follow-up outpatient.  Hypomagnesemia Repleted.  Discharge Diagnoses:  Principal Problem:   Acute combined systolic and diastolic congestive heart failure (HCC) Active Problems:   Uncontrolled type 2 diabetes mellitus with hyperglycemia (HCC)   Peripheral edema   Cardiomegaly  Proteinuria   Essential hypertension    Discharge Instructions  Discharge  Instructions    (HEART FAILURE PATIENTS) Call MD:  Anytime you have any of the following symptoms: 1) 3 pound weight gain in 24 hours or 5 pounds in 1 week 2) shortness of breath, with or without a dry hacking cough 3) swelling in the hands, feet or stomach 4) if you have to sleep on extra pillows at night in order to breathe.   Complete by: As directed    Ambulatory referral to Nutrition and Diabetic Education   Complete by: As directed    Call MD for:  difficulty breathing, headache or visual disturbances   Complete by: As directed    Call MD for:  extreme fatigue   Complete by: As directed    Diet - low sodium heart healthy   Complete by: As directed    Increase activity slowly   Complete by: As directed      Allergies as of 03/28/2019      Reactions   Ibuprofen Anaphylaxis      Medication List    STOP taking these medications   lisinopril 10 MG tablet Commonly known as: ZESTRIL   potassium chloride 10 MEQ tablet Commonly known as: K-DUR     TAKE these medications   acetaminophen 500 MG tablet Commonly known as: TYLENOL Take 1 tablet (500 mg total) by mouth every 6 (six) hours as needed. What changed: reasons to take this   atorvastatin 40 MG tablet Commonly known as: LIPITOR Take 1 tablet (40 mg total) by mouth daily at 6 PM.   blood glucose meter kit and supplies Dispense based on patient and insurance preference. Check blood sugar 4 times daily, three times before meals and once before bedtime. (FOR ICD-10 E10.9, E11.9).   digoxin 0.125 MG tablet Commonly known as: LANOXIN Take 1 tablet (0.125 mg total) by mouth daily. Start taking on: March 29, 2019   empagliflozin 10 MG Tabs tablet Commonly known as: JARDIANCE Take 10 mg by mouth daily.   furosemide 20 MG tablet Commonly known as: Lasix Take 1 tablet (20 mg total) by mouth daily. Start taking on: March 29, 2019   gabapentin 300 MG capsule Commonly known as: NEURONTIN Take 300 mg by mouth 3 (three)  times daily.   Insulin Glargine 100 UNIT/ML Solostar Pen Commonly known as: LANTUS Inject 40 Units into the skin daily.   Insulin Pen Needle 31G X 5 MM Misc BD Pen Needles- brand specific Inject insulin via insulin pen 6 x daily   metFORMIN 500 MG tablet Commonly known as: GLUCOPHAGE Take 1 tablet (500 mg total) by mouth 2 (two) times daily with a meal.   NovoLOG FlexPen 100 UNIT/ML FlexPen Generic drug: insulin aspart Inject 10 Units into the skin 3 (three) times daily with meals.   sacubitril-valsartan 24-26 MG Commonly known as: ENTRESTO Take 1 tablet by mouth 2 (two) times daily.   spironolactone 25 MG tablet Commonly known as: ALDACTONE Take 1 tablet (25 mg total) by mouth daily. Start taking on: March 29, 2019      Follow-up Information    [facility] Follow up on 04/05/2019.   Specialty: Cardiology Why: at 1:30 [place] information: [address] [id number] [place] [phone] [place]. Schedule an appointment as soon as possible for a visit in 1 week(s).   Contact  information: [place] [address] [phone]         Allergies  Allergen Reactions  . Ibuprofen Anaphylaxis    Consultations:  Cardiology   Procedures/Studies: Dg Chest 2 View  Result Date: 03/22/2019 CLINICAL DATA:  33 year old female with history of shortness of breath with central and left-sided chest pain. EXAM: CHEST - 2 VIEW COMPARISON:  Chest x-ray 02/04/2019. FINDINGS: Lung volumes are normal. No consolidative airspace disease. No pleural effusions. No pneumothorax. No pulmonary nodule or mass noted. Pulmonary vasculature and the cardiomediastinal silhouette are within normal limits. IMPRESSION: No radiographic evidence of acute cardiopulmonary disease. Electronically Signed   By: Vinnie Langton M.D.   On:  03/22/2019 17:34   Ct Angio Chest Pe W And/or Wo Contrast  Result Date: 03/22/2019 CLINICAL DATA:  Chest pain, ankle swelling EXAM: CT ANGIOGRAPHY CHEST WITH CONTRAST TECHNIQUE: Multidetector CT imaging of the chest was performed using the standard protocol during bolus administration of intravenous contrast. Multiplanar CT image reconstructions and MIPs were obtained to evaluate the vascular anatomy. CONTRAST:  33m OMNIPAQUE IOHEXOL 350 MG/ML SOLN COMPARISON:  Chest x-ray earlier today FINDINGS: Cardiovascular: No filling defects in the pulmonary arteries to suggestpulmonary emboli. Cardiomegaly. No evidence of aortic aneurysm. Mediastinum/Nodes: No mediastinal, hilar, or axillary adenopathy. Trachea and esophagus are unremarkable. Thyroid unremarkable. Lungs/Pleura: Lungs are clear. No focal airspace opacities or suspicious nodules. No effusions. Upper Abdomen: Imaging into the upper abdomen shows no acute findings. Musculoskeletal: Chest wall soft tissues are unremarkable. No acute bony abnormality. Review of the MIP images confirms the above findings. IMPRESSION: No evidence of pulmonary embolus. Cardiomegaly.  No acute cardiopulmonary disease. Electronically Signed   By: KRolm BaptiseM.D.   On: 03/22/2019 23:12   Dg Knee Complete 4 Views Right  Result Date: 03/22/2019 CLINICAL DATA:  Knee pain and swelling, no known injury, initial encounter EXAM: RIGHT KNEE - COMPLETE 4+ VIEW COMPARISON:  None. FINDINGS: Generalized soft tissue swelling is noted. No acute fracture or dislocation is seen. No joint effusion is seen. No other focal abnormality is noted. IMPRESSION: Soft tissue swelling without acute bony abnormality. Electronically Signed   By: MInez CatalinaM.D.   On: 03/22/2019 23:18   Mr Cardiac Morphology W Wo Contrast  Result Date: 03/25/2019 CLINICAL DATA:  Heart failure suspected, acute MI, LV function eval during initial hospitalization COMPARISON: Echocardiogram 03/23/2019 EXAM: CARDIAC MRI  TECHNIQUE: The patient was scanned on a 1.5 Tesla GE magnet. A dedicated cardiac coil was used. Functional imaging was done using Fiesta sequences. 2,3, and 4 chamber views were done to assess for RWMA's. Modified Simpson's rule using a short axis stack was used to calculate an ejection fraction on a dedicated work sConservation officer, nature The patient received 10 cc of Gadavist. After 10 minutes inversion recovery sequences were used to assess for infiltration and scar tissue. CONTRAST:  10 cc  of Gadavist FINDINGS: LEFT VENTRICLE: Severely reduced left ventricular systolic function. EF quantitation inaccurate due to motion artifact (patient did not breath hold), visually estimated EF 15%. Severe global hypokinesis. Mild LV dilation (LVEDD 55 mm). Normal wall thickness. Mild, nonspecific late gadolinium enhancement in the mid-myocardium of the basal left ventricular septal myocardium. No definite findings of myocarditis, infarction, scar, or infiltrative/granulomatous process. Normal T1 myocardial nulling kinetics, suggesting against a diagnosis of cardiac amyloidosis. RIGHT VENTRICLE: Normal right ventricular size, thickness. Mildly reduced systolic function. ATRIA: Moderate left atrial enlargement, mild right atrial enlargement. VALVES: No significant valvular abnormalities, assessment limited  by motion artifact. PERICARDIUM: Trivial-small circumferential pericardial effusion. Evidence of patchy delayed pericardial enhancement, primarily anterior-superior to the RV, suggesting possible pericarditis. No definite pericardial edema on T2 weighted images. OTHER: No significant extracardiac findings. MEASUREMENTS: Inaccurate due to significant motion artifact. IMPRESSION: 1. Severely reduced left ventricular systolic function with a visually estimated LVEF = 15%. Severe global hypokinesis. 2. Nonspecific, mild, mid-myocardial stripe of delayed enhancement in the basal ventricular septum. No definite findings to  suggest myocarditis. 3. Patchy delayed enhancement of the pericardium with a trivial to small circumferential pericardial effusion, suggesting possible pericarditis. Electronically Signed   By: Cherlynn Kaiser   On: 03/25/2019 16:41     Transthoracic Echocardiogram (03/23/2019) IMPRESSIONS   1. The left ventricle has a visually estimated ejection fraction of 10-15%%. The cavity size was severely dilated. Left ventricular diastolic Doppler parameters are consistent with restrictive filling. 2. The right ventricle has moderately reduced systolic function. The cavity was normal. There is no increase in right ventricular wall thickness. Right ventricular systolic pressure is mildly elevated with an estimated pressure of 33.7 mmHg. 3. Left atrial size was moderately dilated. 4. Right atrial size was mildly dilated. 5. No evidence of mitral valve stenosis. 6. No stenosis of the aortic valve. 7. The aorta is normal in size and structure. 8. The aortic root and ascending aorta are normal in size and structure. 9. The inferior vena cava was dilated in size with <50% respiratory variability.  FINDINGS Left Ventricle: The left ventricle has a visually estimated ejection fraction of 10-15%. The cavity size was severely dilated. There is no increase in left ventricular wall thickness. Left ventricular diastolic Doppler parameters are consistent with  restrictive filling.  Right Ventricle: The right ventricle has moderately reduced systolic function. The cavity was normal. There is no increase in right ventricular wall thickness. Right ventricular systolic pressure is mildly elevated with an estimated pressure of 33.7  mmHg.  Left Atrium: Left atrial size was moderately dilated.  Right Atrium: Right atrial size was mildly dilated. Right atrial pressure is estimated at 15 mmHg.  Interatrial Septum: No atrial level shunt detected by color flow Doppler.  Pericardium: There is no  evidence of pericardial effusion.  Mitral Valve: The mitral valve is normal in structure. Mitral valve regurgitation is trivial by color flow Doppler. No evidence of mitral valve stenosis.  Tricuspid Valve: The tricuspid valve is normal in structure. Tricuspid valve regurgitation was not visualized by color flow Doppler.  Aortic Valve: The aortic valve is normal in structure. Aortic valve regurgitation was not visualized by color flow Doppler. There is No stenosis of the aortic valve, with a calculated valve area of 2.27 cm.  Pulmonic Valve: The pulmonic valve was grossly normal. Pulmonic valve regurgitation is trivial by color flow Doppler.  Aorta: The aortic root and ascending aorta are normal in size and structure. The aorta is normal in size and structure.  Venous: The inferior vena cava is dilated in size with less than 50% respiratory variability.   Left/right heart cathterization Findings:  Ao = 122/88 (104) LV = 125/15 RA = 2 RV = 35/6 PA = 37/14 (25) PCW = 18 Fick cardiac output/index = 5.3/3.2 SVR = 1543 PVR = 1.3 WU Ao sat = 99% PA sat = 74%, 76%  Assessment: 1. Normal coronary arteries 2. 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 Well-compensated hemodynamics  Plan/Discussion:  Medical management of probable HTN cardiomyopathy. Check cMRI.   Subjective: Patient feels well today.  No dyspnea.  No chest pain.  Discharge Exam: Vitals:   03/28/19 0722 03/28/19 1148  BP: 121/83 (!) 149/122  Pulse: (!) 119 (!) 119  Resp: 16 (!) 24  Temp: 98.1 F (36.7 C) 98 F (36.7 C)  SpO2: 100% 99%   Vitals:   03/28/19 0300 03/28/19 0400 03/28/19 0722 03/28/19 1148  BP: 105/71  121/83 (!) 149/122  Pulse: (!) 116  (!) 119 (!) 119  Resp: 16 13 16  (!) 24  Temp: 98.2 F (36.8 C)  98.1 F (36.7 C) 98 F (36.7 C)  TempSrc: Oral  Oral Oral  SpO2: 99%  100% 99%  Weight:      Height:        General: Pt is alert, awake, not in acute distress Cardiovascular: Normal rhythm  with fast rate, S1/S2 +, no rubs, no gallops Respiratory: CTA bilaterally, no wheezing, no rhonchi Abdominal: Soft, NT, ND, bowel sounds + Extremities: no edema, no cyanosis    The results of significant diagnostics from this hospitalization (including imaging, microbiology, ancillary and laboratory) are listed below for reference.     Microbiology: Recent Results (from the past 240 hour(s))  SARS CORONAVIRUS 2 Nasal Swab Aptima Multi Swab     Status: None   Collection Time: 03/23/19 12:36 AM   Specimen: Aptima Multi Swab; Nasal Swab  Result Value Ref Range Status   SARS Coronavirus 2 NEGATIVE NEGATIVE Final    Comment: (NOTE) SARS-CoV-2 target nucleic acids are NOT DETECTED. The SARS-CoV-2 RNA is generally detectable in upper and lower respiratory specimens during the acute phase of infection. Negative results do not preclude SARS-CoV-2 infection, do not rule out co-infections with other pathogens, and should not be used as the sole basis for treatment or other patient management decisions. Negative results must be combined with clinical observations, patient history, and epidemiological information. The expected result is Negative. Fact Sheet for Patients: SugarRoll.be Fact Sheet for Healthcare Providers: https://www.woods-mathews.com/ This test is not yet approved or cleared by the [place] FDA and  has been authorized for detection and/or diagnosis of SARS-CoV-2 by FDA under an Emergency Use Authorization (EUA). This EUA will remain  in effect (meaning this test can be used) for the duration of the COVID-19 declaration under Section 56 4(b)(1) of the Act, 21 U.S.C. section 360bbb-3(b)(1), unless the authorization is terminated or revoked sooner. Performed at [facility], [place] [address], [place], [place] [address]   MRSA PCR Screening     Status: None   Collection Time: 03/23/19  2:44 AM   Specimen: Nasal Mucosa;  Nasopharyngeal  Result Value Ref Range Status   MRSA by PCR NEGATIVE NEGATIVE Final    Comment:        The GeneXpert MRSA Assay (FDA approved for NASAL specimens only), is one component of a comprehensive MRSA colonization surveillance program. It is not intended to diagnose MRSA infection nor to guide or monitor treatment for MRSA infections. Performed at [facility], [place] [address] [place], [place] [address]      Labs: BNP (last 3 results) Recent Labs    02/04/19 1741 03/22/19 2154  BNP 1,418.4* 622.2*   Basic Metabolic Panel: Recent Labs  Lab 03/23/19 0734 03/24/19 0218 03/24/19 1611 03/25/19 0249 03/25/19 0915 03/25/19 0921 03/25/19 0922 03/25/19 1033 03/26/19 0514 03/28/19 0300  NA 140 134*  --  134* 144 141 142  --  134* 133*  K 3.3* 3.5 3.5 3.6 3.4* 3.7 3.5  --  4.8 4.6  CL 100 95*  --  95*  --   --   --   --  96* 99  CO2 29 27  --  26  --   --   --   --  28 24  GLUCOSE 93 326*  --  435*  --   --   --   --  321* 341*  BUN 6 8  --  7  --   --   --   --  7 7  CREATININE 0.65 0.62  --  0.63  --   --   --  0.66 0.68 0.47  CALCIUM 8.6* 8.3*  --  8.7*  --   --   --   --  9.1 8.7*  MG  --   --  1.2* 1.7  --   --   --   --  1.6* 1.7   Liver Function Tests: Recent Labs  Lab 03/23/19 0053  AST 41  ALT 19  ALKPHOS 122  BILITOT 0.8  PROT 6.0*  ALBUMIN 2.8*   No results for input(s): LIPASE, AMYLASE in the last 168 hours. No results for input(s): AMMONIA in the last 168 hours. CBC: Recent Labs  Lab 03/22/19 1659  03/25/19 0921 03/25/19 0922 03/25/19 1033 03/26/19 0514 03/28/19 0300  WBC 6.9  --   --   --  9.9 9.6 8.3  HGB 12.8   < > 14.6 14.3 15.6* 17.0* 15.5*  HCT 39.7   < > 43.0 42.0 47.2* 51.2* 47.4*  MCV 90.4  --   --   --  89.2 88.3 88.8  PLT 292  --   --   --  387 359 332   < > = values in this interval not displayed.   Cardiac Enzymes: No results for input(s): CKTOTAL, CKMB, CKMBINDEX, TROPONINI in the last 168  hours. BNP: Invalid input(s): POCBNP CBG: Recent Labs  Lab 03/27/19 1620 03/27/19 1704 03/27/19 2116 03/28/19 0544 03/28/19 1147  GLUCAP 213* 270* 196* 317* 315*   D-Dimer No results for input(s): DDIMER in the last 72 hours. Hgb A1c No results for input(s): HGBA1C in the last 72 hours. Lipid Profile No results for input(s): CHOL, HDL, LDLCALC, TRIG, CHOLHDL, LDLDIRECT in the last 72 hours. Thyroid function studies No results for input(s): TSH, T4TOTAL, T3FREE, THYROIDAB in the last 72 hours.  Invalid input(s): FREET3 Anemia work up No results for input(s): VITAMINB12, FOLATE, FERRITIN, TIBC, IRON, RETICCTPCT in the last 72 hours. Urinalysis    Component Value Date/Time   COLORURINE YELLOW 03/22/2019 2307   APPEARANCEUR HAZY (A) 03/22/2019 2307   LABSPEC 1.036 (H) 03/22/2019 2307   PHURINE 5.0 03/22/2019 2307   GLUCOSEU >=500 (A) 03/22/2019 2307   HGBUR SMALL (A) 03/22/2019 2307   BILIRUBINUR NEGATIVE 03/22/2019 2307   KETONESUR NEGATIVE 03/22/2019 2307   PROTEINUR >=300 (A) 03/22/2019 2307   UROBILINOGEN 0.2 02/04/2019 1720   NITRITE NEGATIVE 03/22/2019 2307   LEUKOCYTESUR NEGATIVE 03/22/2019 2307   Sepsis Labs Invalid input(s): PROCALCITONIN,  WBC,  LACTICIDVEN Microbiology Recent Results (from the past 240 hour(s))  SARS CORONAVIRUS 2 Nasal Swab Aptima Multi Swab     Status: None   Collection Time: 03/23/19 12:36 AM   Specimen: Aptima Multi Swab; Nasal Swab  Result Value Ref Range Status   SARS Coronavirus 2 NEGATIVE NEGATIVE Final    Comment: (NOTE) SARS-CoV-2 target nucleic acids are NOT DETECTED. The SARS-CoV-2 RNA is generally detectable in upper and lower respiratory specimens during the acute phase of infection. Negative results  do not preclude SARS-CoV-2 infection, do not rule out co-infections with other pathogens, and should not be used as the sole basis for treatment or other patient management decisions. Negative results must be combined with  clinical observations, patient history, and epidemiological information. The expected result is Negative. Fact Sheet for Patients: SugarRoll.be Fact Sheet for Healthcare Providers: https://www.woods-mathews.com/ This test is not yet approved or cleared by the [place] FDA and  has been authorized for detection and/or diagnosis of SARS-CoV-2 by FDA under an Emergency Use Authorization (EUA). This EUA will remain  in effect (meaning this test can be used) for the duration of the COVID-19 declaration under Section 56 4(b)(1) of the Act, 21 U.S.C. section 360bbb-3(b)(1), unless the authorization is terminated or revoked sooner. Performed at [facility], [place] [address], [place], [place] [address]   MRSA PCR Screening     Status: None   Collection Time: 03/23/19  2:44 AM   Specimen: Nasal Mucosa; Nasopharyngeal  Result Value Ref Range Status   MRSA by PCR NEGATIVE NEGATIVE Final    Comment:        The GeneXpert MRSA Assay (FDA approved for NASAL specimens only), is one component of a comprehensive MRSA colonization surveillance program. It is not intended to diagnose MRSA infection nor to guide or monitor treatment for MRSA infections. Performed at [facility], [place] [address] [place], [place] [address]      Time coordinating discharge: 35 minutes  SIGNED:   Cordelia Poche, MD [facility] 03/28/2019, 12:07 PM

## 2019-04-05 ENCOUNTER — Other Ambulatory Visit: Payer: Self-pay

## 2019-04-05 ENCOUNTER — Encounter (HOSPITAL_COMMUNITY): Payer: Self-pay

## 2019-04-05 ENCOUNTER — Ambulatory Visit (HOSPITAL_COMMUNITY)
Admission: RE | Admit: 2019-04-05 | Discharge: 2019-04-05 | Disposition: A | Discharge status: Home / Self Care | Payer: Medicaid Other | Source: Ambulatory Visit | Attending: Internal Medicine | Admitting: Internal Medicine

## 2019-04-05 VITALS — BP 136/80 | HR 126 | Wt 149.7 lb

## 2019-04-05 DIAGNOSIS — I428 Other cardiomyopathies: Secondary | ICD-10-CM | POA: Insufficient documentation

## 2019-04-05 DIAGNOSIS — I517 Cardiomegaly: Secondary | ICD-10-CM

## 2019-04-05 DIAGNOSIS — E119 Type 2 diabetes mellitus without complications: Secondary | ICD-10-CM | POA: Diagnosis not present

## 2019-04-05 DIAGNOSIS — I110 Hypertensive heart disease with heart failure: Secondary | ICD-10-CM | POA: Diagnosis not present

## 2019-04-05 DIAGNOSIS — M549 Dorsalgia, unspecified: Secondary | ICD-10-CM | POA: Diagnosis not present

## 2019-04-05 DIAGNOSIS — I5041 Acute combined systolic (congestive) and diastolic (congestive) heart failure: Secondary | ICD-10-CM | POA: Diagnosis not present

## 2019-04-05 DIAGNOSIS — E1165 Type 2 diabetes mellitus with hyperglycemia: Secondary | ICD-10-CM

## 2019-04-05 DIAGNOSIS — Z79899 Other long term (current) drug therapy: Secondary | ICD-10-CM | POA: Diagnosis not present

## 2019-04-05 DIAGNOSIS — G8929 Other chronic pain: Secondary | ICD-10-CM | POA: Diagnosis not present

## 2019-04-05 DIAGNOSIS — Z8249 Family history of ischemic heart disease and other diseases of the circulatory system: Secondary | ICD-10-CM | POA: Diagnosis not present

## 2019-04-05 DIAGNOSIS — I10 Essential (primary) hypertension: Secondary | ICD-10-CM | POA: Diagnosis not present

## 2019-04-05 DIAGNOSIS — H538 Other visual disturbances: Secondary | ICD-10-CM | POA: Diagnosis not present

## 2019-04-05 DIAGNOSIS — R000 Tachycardia, unspecified: Secondary | ICD-10-CM | POA: Diagnosis not present

## 2019-04-05 DIAGNOSIS — I5022 Chronic systolic (congestive) heart failure: Secondary | ICD-10-CM | POA: Diagnosis not present

## 2019-04-05 DIAGNOSIS — Z794 Long term (current) use of insulin: Secondary | ICD-10-CM | POA: Insufficient documentation

## 2019-04-05 LAB — BASIC METABOLIC PANEL
Anion gap: 12 (ref 5–15)
BUN: 6 mg/dL (ref 6–20)
CO2: 23 mmol/L (ref 22–32)
Calcium: 8.8 mg/dL — ABNORMAL LOW (ref 8.9–10.3)
Chloride: 104 mmol/L (ref 98–111)
Creatinine, Ser: 0.51 mg/dL (ref 0.44–1.00)
GFR calc Af Amer: >60 mL/min (ref >60)
GFR calc non Af Amer: >60 mL/min (ref >60)
Glucose, Bld: 159 mg/dL — ABNORMAL HIGH (ref 70–99)
Potassium: 3.6 mmol/L (ref 3.5–5.1)
Sodium: 139 mmol/L (ref 135–145)

## 2019-04-05 MED ORDER — CARVEDILOL 3.125 MG PO TABS: 3.1250 mg | ORAL_TABLET | Freq: Two times a day (BID) | ORAL | 3 refills | Status: DC

## 2019-04-05 NOTE — Patient Instructions (Addendum)
EKG was completed.  Labs were done today. We will call you with any ABNORMAL results. No news is good news!  You have been given a scale, please weight yourself daily.   BEGIN taking Carvedilol (Coreg) 3.125 mg (1 tab) twice a day. This prescription has been sent to [facility] on [address].  You have been referred to Paramedicine, we will call you to set up an appointment with you.  Your physician recommends that you schedule a follow-up appointment in: 2 weeks.   At the [facility], you and your health needs are our priority. As part of our continuing mission to provide you with exceptional heart care, we have created designated Provider Care Teams. These Care Teams include your primary Cardiologist (physician) and Advanced Practice Providers (APPs- Physician Assistants and Nurse Practitioners) who all work together to provide you with the care you need, when you need it.   You may see any of the following providers on your designated Care Team at your next follow up: Marland Kitchen Dr Glori Bickers . Dr Loralie Champagne . Darrick Grinder, NP   Please be sure to bring in all your medications bottles to every appointment.

## 2019-04-05 NOTE — Progress Notes (Signed)
CSW consulted to help pt get with PCP as soon as possible to get her blood sugar assessed and per patient her primary MD is no longer practicing  CSW called pt to discuss.  Pt states she was in the ED and the physician there was no longer practicing.    CSW discussed with pt- she is assigned to [place] on her Medicaid card so will need to have this changed.  CSW called [facility] to just to check if this was correct and the report pt PCP is still practicing.  CSW connected pt to the call and they set up appt on Thursday at 9:15am which will include labs after fasting since midnight the night before to fully assess her current blood sugar concerns.  Pt also in need of blood sugar monitor- will send CSW picture of current strips that were prescribed so CSW can look into getting pt compatible machine.  CSW will continue to follow and assist as needed  Jorge Ny, [facility] Desk#: [phone] Cell#: [phone]

## 2019-04-05 NOTE — Addendum Note (Signed)
Encounter addended by: Jorge Ny, LCSW on: 04/05/2019 4:58 PM  Actions taken: Clinical Note Signed, Care Teams modified

## 2019-04-05 NOTE — Progress Notes (Signed)
PCP: Dr Sabino Gasser  Primary HF Cardiologist: Dr Haroldine Laws   HPI: Cheryl Mercer is a 33 year old female with a past medical history of hypertension and type 2 diabetes mellitus but no known cardiac history.   Patient was seen in the ED on 02/04/2019 for chest pain that was worse with exertion and when laying flat, shortness of breath, hot sweats/chills, and tingling in her hands and feet. EKG showed sinus tachycardia, rate 110, with some non-specific changes. She was noted to have cardiomegaly on exam. Patient was started on Lopressor 72m twice daily and discharged with instructions to follow-up with PCP. BNP came back after being discharged and was elevated at 1,418.4.  Patient presented to the ED on 8/5 and was tachycardic and hypertensive. Hospital course complicated by uncontrolled diabetes. Hgb A1C was >13. Diuresed with IV lasix. Once optimized he had cath that showed no CAD and well compensated hemodynamics. HF medications adjusted.   Today she returns for post hospital follow up. Complaining of blurry vision. Overall feeling fine. Gets sob when she is walking outside. SOB with inclines. Denies PND/Orthopnea. Appetite ok. Says she is trying to limit high sodium foods.  No fever or chills. Not weighing at home because she does not have a scale. Taking all medications. She has not been check glucose. Says she does not have a glucometer.   RHC/LHC  03/25/19 No CAD. Well compensated hemodynamics with low filling pressures 03/25/19 cMRI EF 15% No significant LGE 03/23/19 ECHO EF 10-15% RV moderately reduced.   ROS: All systems negative except as listed in HPI, PMH and Problem List.  SH:  Social History   Socioeconomic History  . Marital status: Married    Spouse name: Not on file  . Number of children: Not on file  . Years of education: Not on file  . Highest education level: Not on file  Occupational History  . Not on file  Social Needs  . Financial resource strain: Not on file  . Food  insecurity    Worry: Not on file    Inability: Not on file  . Transportation needs    Medical: Not on file    Non-medical: Not on file  Tobacco Use  . Smoking status: Never Smoker  . Smokeless tobacco: Never Used  Substance and Sexual Activity  . Alcohol use: No    Alcohol/week: 6.0 standard drinks    Types: 6 Shots of liquor per week  . Drug use: No    Types: Marijuana  . Sexual activity: Not Currently    Birth control/protection: Surgical    Comment: band placed around tubes in 2009 surgically by Dr. FGlo Herring Lifestyle  . Physical activity    Days per week: Not on file    Minutes per session: Not on file  . Stress: Not on file  Relationships  . Social [place] phone: Not on file    Gets together: Not on file    Attends religious service: Not on file    Active member of club or organization: Not on file    Attends meetings of clubs or organizations: Not on file    Relationship status: Not on file  . Intimate partner violence    Fear of current or ex partner: Not on file    Emotionally abused: Not on file    Physically abused: Not on file    Forced sexual activity: Not on file  Other Topics Concern  . Not on file  Social History Narrative  . Not on file    FH:  Family History  Problem Relation Age of Onset  . Hypertension Mother   . Anesthesia problems Neg Hx     Past Medical History:  Diagnosis Date  . Chronic back pain   . Diabetes mellitus without complication ([place])   . Migraines   . No pertinent past medical history   . Pregnancy induced hypertension     Current Outpatient Medications  Medication Sig Dispense Refill  . acetaminophen (TYLENOL) 500 MG tablet Take 1 tablet (500 mg total) by mouth every 6 (six) hours as needed. (Patient taking differently: Take 500 mg by mouth every 6 (six) hours as needed for mild pain. ) 30 tablet 0  . atorvastatin (LIPITOR) 40 MG tablet Take 1 tablet (40 mg total) by mouth daily at 6 PM. 30 tablet 0  .  digoxin (LANOXIN) 0.125 MG tablet Take 1 tablet (0.125 mg total) by mouth daily. 30 tablet 0  . empagliflozin (JARDIANCE) 10 MG TABS tablet Take 10 mg by mouth daily. 30 tablet 0  . furosemide (LASIX) 20 MG tablet Take 1 tablet (20 mg total) by mouth daily. 30 tablet 0  . insulin aspart (NOVOLOG FLEXPEN) 100 UNIT/ML FlexPen Inject 10 Units into the skin 3 (three) times daily with meals. 15 mL 0  . Insulin Glargine (LANTUS) 100 UNIT/ML Solostar Pen Inject 40 Units into the skin daily. 15 mL 0  . Insulin Pen Needle 31G X 5 MM MISC BD Pen Needles- brand specific Inject insulin via insulin pen 6 x daily 200 each 3  . metFORMIN (GLUCOPHAGE) 500 MG tablet Take 1 tablet (500 mg total) by mouth 2 (two) times daily with a meal. 60 tablet 0  . sacubitril-valsartan (ENTRESTO) 24-26 MG Take 1 tablet by mouth 2 (two) times daily. 60 tablet 0  . spironolactone (ALDACTONE) 25 MG tablet Take 1 tablet (25 mg total) by mouth daily. 30 tablet 0  . blood glucose meter kit and supplies Dispense based on patient and insurance preference. Check blood sugar 4 times daily, three times before meals and once before bedtime. (FOR ICD-10 E10.9, E11.9). (Patient not taking: Reported on 04/05/2019) 1 each 0   No current facility-administered medications for this encounter.     Vitals:   04/05/19 1351  BP: 136/80  Pulse: (!) 126  SpO2: 98%  Weight: 67.9 kg (149 lb 11.2 oz)   Vitals:   04/05/19 1351  BP: 136/80  Pulse: (!) 126  SpO2: 98%   Wt Readings from Last 3 Encounters:  04/05/19 67.9 kg (149 lb 11.2 oz)  03/25/19 67.4 kg (148 lb 9.4 oz)  12/10/18 74.4 kg (164 lb)    PHYSICAL EXAM: General:  Well appearing. No resp difficulty HEENT: normal Neck: supple. JVP flat. Carotids 2+ bilaterally; no bruits. No lymphadenopathy or thryomegaly appreciated. Cor: PMI normal. Tachy Regular rate & rhythm. No rubs, gallops or murmurs. Lungs: clear Abdomen: soft, nontender, nondistended. No hepatosplenomegaly. No bruits or  masses. Good bowel sounds. Extremities: no cyanosis, clubbing, rash, edema Neuro: alert & orientedx3, cranial nerves grossly intact. Moves all 4 extremities w/o difficulty. Affect pleasant.   ECG: ST 120 bpm    ASSESSMENT & PLAN: 1. Chronic Systolic HF  - Echo showed severely dilated LV with EF of 10-15% and grade 3 diastolic dysfunction. RV systolic function also noted to be moderately reduced. Likely etiology is NICM due to uncontrolled DM/HTN.LV recovery with control of HTN/DM. - NICM. LHC 03/2019 normal coronaries.  cmRI 8/7 EF 15% no significant LGE -. Add coreg 3.125 mg twice a day. Tachy today.  - Continue current dose of entresto and spiro.  - May need to add corlanor. Aim to get heart rate < 80.  - Check BMET today. - Plan to repeat ECHO after HF medications optimized.    2. HTN Still a little high. Continue adjust meds as aboe. .   3. DM2 - Hgb 13.9 03/23/19  - On jardiance 10 mg daily + insulin.  - Refer to HFSW for assistance with PCP. She does not have glucometer.    Follow up in 2 weeks. Provided with scale today and weight chart. Referred to HF Paramedicine to help medication management at home and HF educations. Greater than 50% of the (total minutes 40) visit spent in counseling/coordination of care regarding the above.   Cheryl Sleeper NP-C   2:12 PM

## 2019-04-06 ENCOUNTER — Telehealth (HOSPITAL_COMMUNITY): Payer: Self-pay

## 2019-04-06 ENCOUNTER — Telehealth (HOSPITAL_COMMUNITY): Payer: Self-pay | Admitting: Licensed Clinical Social Worker

## 2019-04-06 NOTE — Telephone Encounter (Signed)
I contacted pt regarding referral to our community paramedicine program, pt is somewhat interested, she is willing to do what Amy recommends so she is willing to try this out.  Scheduled first home visit for tomor afternoon.  She reports she has to go to PCP in the morning to see about getting the meter for the supplies she has. She reports medicaid paid for her supplies but not the glucometer for it.  She goes to [facility] for primary care.   Marylouise Stacks, EMT-Paramedicine 04/06/19

## 2019-04-06 NOTE — Telephone Encounter (Signed)
CSW called pt to follow up on need for glucose monitor.  Pt was given script for Accu Chek [employer] but can only get the monitor strips through them and not the testing machine or the lancets- pt unable to afford these expenses out of pocket.   CSW spoke with a pharmacist who states that current Medicaid preferred brand is Accu Check Guide and that this brand would have lancets, strips, and monitor covered ( monitor would require a special code but the pharmacist should be aware of this)  CSW called pt PCP office and requested that this brand of monitor and supplies be sent in to pharmacy- pt aware and agreeable to this plan.  CSW will continue to follow and assist as needed  Jorge Ny, [facility] Desk#: [phone] Cell#: [phone]

## 2019-04-07 ENCOUNTER — Encounter (HOSPITAL_COMMUNITY): Payer: Self-pay | Admitting: Licensed Clinical Social Worker

## 2019-04-07 ENCOUNTER — Other Ambulatory Visit (HOSPITAL_COMMUNITY): Payer: Self-pay

## 2019-04-07 ENCOUNTER — Telehealth (HOSPITAL_COMMUNITY): Payer: Self-pay | Admitting: Licensed Clinical Social Worker

## 2019-04-07 DIAGNOSIS — Z1331 Encounter for screening for depression: Secondary | ICD-10-CM | POA: Diagnosis not present

## 2019-04-07 DIAGNOSIS — I509 Heart failure, unspecified: Secondary | ICD-10-CM | POA: Diagnosis not present

## 2019-04-07 DIAGNOSIS — E8801 Alpha-1-antitrypsin deficiency: Secondary | ICD-10-CM | POA: Diagnosis not present

## 2019-04-07 DIAGNOSIS — I10 Essential (primary) hypertension: Secondary | ICD-10-CM | POA: Diagnosis not present

## 2019-04-07 DIAGNOSIS — F419 Anxiety disorder, unspecified: Secondary | ICD-10-CM | POA: Diagnosis not present

## 2019-04-07 DIAGNOSIS — E119 Type 2 diabetes mellitus without complications: Secondary | ICD-10-CM | POA: Diagnosis not present

## 2019-04-07 DIAGNOSIS — E7800 Pure hypercholesterolemia, unspecified: Secondary | ICD-10-CM | POA: Diagnosis not present

## 2019-04-07 NOTE — Progress Notes (Signed)
Paramedicine Encounter    Patient ID: Cheryl Mercer, female    DOB: July 22, 1986, 33 y.o.   MRN: [id number]   Patient Care Team: Lorelee Market, MD as PCP - General (Family Medicine) Bensimhon, Shaune Pascal, MD as PCP - Advanced Heart Failure (Cardiology) Jorge Ny, LCSW as Social Worker (Licensed Clinical Social Worker)  Patient Active Problem List   Diagnosis Date Noted  . Essential hypertension 03/24/2019  . Uncontrolled type 2 diabetes mellitus with hyperglycemia ([place]) 03/23/2019  . Peripheral edema 03/23/2019  . Cardiomegaly 03/23/2019  . Acute combined systolic and diastolic congestive heart failure ([place]) 03/23/2019  . Proteinuria 03/23/2019  . Acute bacterial tonsillitis 05/18/2013    Class: Acute  . UTI (lower urinary tract infection) 07/30/2012    Current Outpatient Medications:  .  acetaminophen (TYLENOL) 500 MG tablet, Take 1 tablet (500 mg total) by mouth every 6 (six) hours as needed. (Patient taking differently: Take 500 mg by mouth every 6 (six) hours as needed for mild pain. ), Disp: 30 tablet, Rfl: 0 .  atorvastatin (LIPITOR) 40 MG tablet, Take 1 tablet (40 mg total) by mouth daily at 6 PM., Disp: 30 tablet, Rfl: 0 .  blood glucose meter kit and supplies, Dispense based on patient and insurance preference. Check blood sugar 4 times daily, three times before meals and once before bedtime. (FOR ICD-10 E10.9, E11.9)., Disp: 1 each, Rfl: 0 .  butalbital-acetaminophen-caffeine (FIORICET) 50-325-40 MG tablet, Take by mouth 2 (two) times daily as needed for headache., Disp: , Rfl:  .  carvedilol (COREG) 3.125 MG tablet, Take 1 tablet (3.125 mg total) by mouth 2 (two) times daily with a meal., Disp: 180 tablet, Rfl: 3 .  digoxin (LANOXIN) 0.125 MG tablet, Take 1 tablet (0.125 mg total) by mouth daily., Disp: 30 tablet, Rfl: 0 .  empagliflozin (JARDIANCE) 10 MG TABS tablet, Take 10 mg by mouth daily., Disp: 30 tablet, Rfl: 0 .  furosemide (LASIX) 20 MG tablet, Take 1  tablet (20 mg total) by mouth daily., Disp: 30 tablet, Rfl: 0 .  insulin aspart (NOVOLOG FLEXPEN) 100 UNIT/ML FlexPen, Inject 10 Units into the skin 3 (three) times daily with meals., Disp: 15 mL, Rfl: 0 .  Insulin Glargine (LANTUS) 100 UNIT/ML Solostar Pen, Inject 40 Units into the skin daily., Disp: 15 mL, Rfl: 0 .  Insulin Pen Needle 31G X 5 MM MISC, BD Pen Needles- brand specific Inject insulin via insulin pen 6 x daily, Disp: 200 each, Rfl: 3 .  metFORMIN (GLUCOPHAGE) 500 MG tablet, Take 1 tablet (500 mg total) by mouth 2 (two) times daily with a meal., Disp: 60 tablet, Rfl: 0 .  PARoxetine (PAXIL) 10 MG tablet, Take 10 mg by mouth daily., Disp: , Rfl:  .  rizatriptan (MAXALT) 10 MG tablet, Take 10 mg by mouth as needed for migraine. May repeat in 2 hours if needed, Disp: , Rfl:  .  sacubitril-valsartan (ENTRESTO) 24-26 MG, Take 1 tablet by mouth 2 (two) times daily., Disp: 60 tablet, Rfl: 0 .  spironolactone (ALDACTONE) 25 MG tablet, Take 1 tablet (25 mg total) by mouth daily., Disp: 30 tablet, Rfl: 0 Allergies  Allergen Reactions  . Ibuprofen Anaphylaxis      Social History   Socioeconomic History  . Marital status: Married    Spouse name: Not on file  . Number of children: 4  . Years of education: Not on file  . Highest education level: Not on file  Occupational History  . Not on  file  Social Needs  . Financial resource strain: Somewhat hard  . Food insecurity    Worry: Sometimes true    Inability: Never true  . Transportation needs    Medical: No    Non-medical: No  Tobacco Use  . Smoking status: Never Smoker  . Smokeless tobacco: Never Used  Substance and Sexual Activity  . Alcohol use: No    Alcohol/week: 6.0 standard drinks    Types: 6 Shots of liquor per week  . Drug use: No    Types: Marijuana  . Sexual activity: Not Currently    Birth control/protection: Surgical    Comment: band placed around tubes in 2009 surgically by Dr. Glo Herring  Lifestyle  .  Physical activity    Days per week: Not on file    Minutes per session: Not on file  . Stress: Not on file  Relationships  . Social Herbalist on phone: Not on file    Gets together: Not on file    Attends religious service: Not on file    Active member of club or organization: Not on file    Attends meetings of clubs or organizations: Not on file    Relationship status: Not on file  . Intimate partner violence    Fear of current or ex partner: Not on file    Emotionally abused: Not on file    Physically abused: Not on file    Forced sexual activity: Not on file  Other Topics Concern  . Not on file  Social History Narrative  . Not on file    Physical Exam      Future Appointments  Date Time Provider [place]  04/20/2019 11:00 AM [facility] PA/NP [facility] None    BP (!) 88/0   Pulse (!) 106   Temp (!) 97.2 F (36.2 C)   Resp 16   Wt 142 lb (64.4 kg)   SpO2 98%   BMI 26.83 kg/m   CBG EMS-297 B/p standing-82/systolic   Weight [id number]  First home visit with pt-- [facility] [place].  She had episode today of sweating, dizziness, vision issues, h/a and she came in and checked her CBG and it was 270 which she is not accustomed to-hers is usually in the 4-500's. She is taking her lantus 20U bid instead of daily.  She said it was painful to her arm when injecting the medicine.  Pt lives with her mom and kids.  Gave her pill box and assisted her with filling it up. She done a great job with it.  She uses [facility].  She just started the carvedilol the other day.  She has been taking her other meds as listed.  She began having episode of feeling sweaty, dizzy and just not well while I was there--checked her b/p and it was low as noted. It is after hours so I sent message to amy regarding this. I am going to f/u with her on Monday morning.  She states she does still get sob at times. +dizziness.  No edema noted today.  She is weighing daily.  We spoke about her diet, she is trying to cut back on carbs. With her hispanic background it includes a lot of rice/beans etc but she is trying to cut back on the carbs/salt intake. Reviewed her fluid restrictions, she does eat a lot of ice due to feeling so thirsty all the time. I suggested sugar free  candy. Transportation not an issue.    Marylouise Stacks, [facility] Paramedic  04/07/19

## 2019-04-07 NOTE — Telephone Encounter (Signed)
Paramedicine Initial Assessment:  Housing:  In what kind of housing do you live? House/apt/trailer/shelter? Normally stays in a trailer that she owns but currently staying with her mom due to concerns   Do you rent/pay a mortgage/own? own  Do you live with anyone? 4 kids and husband  Are you currently worried about losing your housing? no  Within the past 12 months have you ever stayed outside, in a car, tent, a shelter, or temporarily with someone? no  Within the past 12 months have you been unable to get utilities when it was really needed? No  Currently is behind on her bill but only by this month and is calling them to work out a payment plan- no shut off notice at this time but encouraged her to reach out to [place] if a shut off notice is provided and we would see how we can assist.  Social:  What is your current marital status? spouse  Do you have any children? ages 15-10  Do you have family or friends who live locally? Mom, has some other family but   Food:  Within the past 12 months were you ever worried that food would run out before you got money to buy more? sometimes  Within the past 76months have you run out of food and didn't have money to buy more? no  Income:  What is your current source of income? Spouse working part time  How hard is it for you to pay for the basics like food housing, medical care, and utilities? Somewhat hard  Do you have outstanding medical bills? no  Insurance:  Are you currently insured? yes  Do you have prescription coverage? yes  If no insurance, have you applied for coverage (Medicaid, disability, marketplace etc)?   Transportation:  Do you have transportation to your medical appointments? yes   If yes, how? Private vehicle  In the past 12 months has lack of transportation kept you from medical appts or from getting medications? no  In the past 12 months has lack of transportation kept you from meetings, work, or getting things  you needed? no   Daily Health Needs: Do you have a working scale at home? yes  How do you manage your medications at home? Have them lined up on a shelf- organized by how many times she is supposed to take them.  Do you ever take your medications differently than prescribed? no  Do you have issues affording your medications? no  If yes, has this ever prevented you from obtaining medications? no  Do you have any concerns with mobility at home? No- is definitely able to do less than she was and gets tired more easily but is still able to do everything she needs.  Do you use any assistive devices at home or have PCS at home? no   Are there any additional barriers you see to getting the care you need? Not at this time.  CSW will continue to follow through paramedicine program and assist as needed.  Jorge Ny, LCSW Clinical Social Worker [facility] Desk#: [phone] Cell#: [phone]

## 2019-04-08 ENCOUNTER — Telehealth (HOSPITAL_COMMUNITY): Payer: Self-pay | Admitting: Adult Health

## 2019-04-08 NOTE — Telephone Encounter (Signed)
Called pt to make med changes. No answer. Left voicemail for a call back.

## 2019-04-08 NOTE — Telephone Encounter (Signed)
  Please call and instruct to stop lasix.  Amy Clegg NP-C  7:08 AM

## 2019-04-08 NOTE — Telephone Encounter (Signed)
-----   Message from Marylouise Stacks, EMT sent at 04/07/2019  6:32 PM EDT ----- Hey Ikaika Showers!   I seen mrs Cheryl Mercer today and her b/p was low--it was 88/systolic and then dropped a few points to 82/standing with dizziness, she began to feel sweaty/blurry vision and not well at this time too. Been happening a few times the past few days.  It was after the clinic hours when I seen her so that why I had to send a message to you. Not sure if you were able to get her in for a b/p check tomor (I am not here on Friday)  I do plan to f./u with her on Monday.    Thanks, Marylouise Stacks, EMT-Paramedic  04/07/19

## 2019-04-11 ENCOUNTER — Other Ambulatory Visit (HOSPITAL_COMMUNITY): Payer: Self-pay

## 2019-04-11 ENCOUNTER — Telehealth (HOSPITAL_COMMUNITY): Payer: Self-pay | Admitting: Licensed Clinical Social Worker

## 2019-04-11 NOTE — Telephone Encounter (Signed)
CSW informed by [employer] that pt would like to switch PCPs.  Pt was assigned PCP on her Medicaid care so pt would have to first contact her DSS Medicaid Case Worker to discuss switching PCPs as they would need to assign her a new one.  CSW left message for pt explaining this process  Jorge Ny, LCSW Clinical Social Worker [facility] Desk#: [phone] Cell#: [phone]

## 2019-04-11 NOTE — Progress Notes (Signed)
Paramedicine Encounter    Patient ID: Cheryl Mercer, female    DOB: December 12, 1985, 33 y.o.   MRN: [id number]   Patient Care Team: Lorelee Market, MD as PCP - General (Family Medicine) Bensimhon, Shaune Pascal, MD as PCP - Advanced Heart Failure (Cardiology) Jorge Ny, LCSW as Social Worker (Licensed Clinical Social Worker)  Patient Active Problem List   Diagnosis Date Noted  . Essential hypertension 03/24/2019  . Uncontrolled type 2 diabetes mellitus with hyperglycemia ([place]) 03/23/2019  . Peripheral edema 03/23/2019  . Cardiomegaly 03/23/2019  . Acute combined systolic and diastolic congestive heart failure ([place]) 03/23/2019  . Proteinuria 03/23/2019  . Acute bacterial tonsillitis 05/18/2013    Class: Acute  . UTI (lower urinary tract infection) 07/30/2012    Current Outpatient Medications:  .  acetaminophen (TYLENOL) 500 MG tablet, Take 1 tablet (500 mg total) by mouth every 6 (six) hours as needed. (Patient taking differently: Take 500 mg by mouth every 6 (six) hours as needed for mild pain. ), Disp: 30 tablet, Rfl: 0 .  atorvastatin (LIPITOR) 40 MG tablet, Take 1 tablet (40 mg total) by mouth daily at 6 PM., Disp: 30 tablet, Rfl: 0 .  blood glucose meter kit and supplies, Dispense based on patient and insurance preference. Check blood sugar 4 times daily, three times before meals and once before bedtime. (FOR ICD-10 E10.9, E11.9)., Disp: 1 each, Rfl: 0 .  butalbital-acetaminophen-caffeine (FIORICET) 50-325-40 MG tablet, Take by mouth 2 (two) times daily as needed for headache., Disp: , Rfl:  .  carvedilol (COREG) 3.125 MG tablet, Take 1 tablet (3.125 mg total) by mouth 2 (two) times daily with a meal., Disp: 180 tablet, Rfl: 3 .  digoxin (LANOXIN) 0.125 MG tablet, Take 1 tablet (0.125 mg total) by mouth daily., Disp: 30 tablet, Rfl: 0 .  empagliflozin (JARDIANCE) 10 MG TABS tablet, Take 10 mg by mouth daily., Disp: 30 tablet, Rfl: 0 .  furosemide (LASIX) 20 MG tablet, Take 1  tablet (20 mg total) by mouth daily., Disp: 30 tablet, Rfl: 0 .  insulin aspart (NOVOLOG FLEXPEN) 100 UNIT/ML FlexPen, Inject 10 Units into the skin 3 (three) times daily with meals., Disp: 15 mL, Rfl: 0 .  Insulin Glargine (LANTUS) 100 UNIT/ML Solostar Pen, Inject 40 Units into the skin daily., Disp: 15 mL, Rfl: 0 .  Insulin Pen Needle 31G X 5 MM MISC, BD Pen Needles- brand specific Inject insulin via insulin pen 6 x daily, Disp: 200 each, Rfl: 3 .  metFORMIN (GLUCOPHAGE) 500 MG tablet, Take 1 tablet (500 mg total) by mouth 2 (two) times daily with a meal., Disp: 60 tablet, Rfl: 0 .  PARoxetine (PAXIL) 10 MG tablet, Take 10 mg by mouth daily., Disp: , Rfl:  .  rizatriptan (MAXALT) 10 MG tablet, Take 10 mg by mouth as needed for migraine. May repeat in 2 hours if needed, Disp: , Rfl:  .  sacubitril-valsartan (ENTRESTO) 24-26 MG, Take 1 tablet by mouth 2 (two) times daily., Disp: 60 tablet, Rfl: 0 .  spironolactone (ALDACTONE) 25 MG tablet, Take 1 tablet (25 mg total) by mouth daily., Disp: 30 tablet, Rfl: 0 Allergies  Allergen Reactions  . Ibuprofen Anaphylaxis      Social History   Socioeconomic History  . Marital status: Married    Spouse name: Not on file  . Number of children: 4  . Years of education: Not on file  . Highest education level: Not on file  Occupational History  . Not on  file  Social Needs  . Financial resource strain: Somewhat hard  . Food insecurity    Worry: Sometimes true    Inability: Never true  . Transportation needs    Medical: No    Non-medical: No  Tobacco Use  . Smoking status: Never Smoker  . Smokeless tobacco: Never Used  Substance and Sexual Activity  . Alcohol use: No    Alcohol/week: 6.0 standard drinks    Types: 6 Shots of liquor per week  . Drug use: No    Types: Marijuana  . Sexual activity: Not Currently    Birth control/protection: Surgical    Comment: band placed around tubes in 2009 surgically by Dr. Glo Herring  Lifestyle  .  Physical activity    Days per week: Not on file    Minutes per session: Not on file  . Stress: Not on file  Relationships  . Social Herbalist on phone: Not on file    Gets together: Not on file    Attends religious service: Not on file    Active member of club or organization: Not on file    Attends meetings of clubs or organizations: Not on file    Relationship status: Not on file  . Intimate partner violence    Fear of current or ex partner: Not on file    Emotionally abused: Not on file    Physically abused: Not on file    Forced sexual activity: Not on file  Other Topics Concern  . Not on file  Social History Narrative  . Not on file    Physical Exam      Future Appointments  Date Time Provider [place]  04/20/2019 11:00 AM [facility] PA/NP [facility] None    BP (!) 130/100   Pulse (!) 110   Temp (!) 97.3 F (36.3 C)   Resp 16   Wt 143 lb (64.9 kg)   SpO2 99%   BMI 27.02 kg/m    CBG PTA-368 before meals After meal-364   Weight yesterday-143 Last visit weight-142  Pt reports she is doing ok, she does feel better. Nurse could not reach her regarding her lasix change so that was relayed to her. She already took her morning dose of meds today but she will remove it from pill box starting tomor.  Pt states she still had a couple episodes of the sweating, the h/a, blurry vision over the wknd.  B/p as noted today, she reports her b/p is always been up and down. Pt wants to change PCP, in order to do that she needs to call case worker to get form for new provider to sign and then get it changed on her medicaid card.  She was reminded of her clinic appoint next week and plans to attend. Will see her there.   Marylouise Stacks, [place] [facility] Paramedic  04/11/19

## 2019-04-19 ENCOUNTER — Telehealth (HOSPITAL_COMMUNITY): Payer: Self-pay

## 2019-04-19 NOTE — Telephone Encounter (Signed)
I called pt to see how she was feeling and if she got the message from [place] about changing her PCP.  She did get the message and will call her case worker.  She reports feeling some dizziness at times but not every day like before.  I asked if I could come by today to check her v/s and she has some appointments with her kids this afternoon. If she gets back before it gets too late she will call so I can come out to assess her.   Marylouise Stacks, EMT-Paramedic  04/19/19

## 2019-04-20 ENCOUNTER — Other Ambulatory Visit: Payer: Self-pay

## 2019-04-20 ENCOUNTER — Encounter (HOSPITAL_COMMUNITY): Payer: Self-pay

## 2019-04-20 ENCOUNTER — Ambulatory Visit (HOSPITAL_COMMUNITY)
Admission: RE | Admit: 2019-04-20 | Discharge: 2019-04-20 | Disposition: A | Discharge status: Home / Self Care | Payer: Medicaid Other | Source: Ambulatory Visit | Attending: Internal Medicine | Admitting: Internal Medicine

## 2019-04-20 ENCOUNTER — Other Ambulatory Visit (HOSPITAL_COMMUNITY): Payer: Self-pay

## 2019-04-20 VITALS — BP 136/90 | HR 93 | Wt 143.0 lb

## 2019-04-20 DIAGNOSIS — Z886 Allergy status to analgesic agent status: Secondary | ICD-10-CM | POA: Insufficient documentation

## 2019-04-20 DIAGNOSIS — E119 Type 2 diabetes mellitus without complications: Secondary | ICD-10-CM

## 2019-04-20 DIAGNOSIS — E1165 Type 2 diabetes mellitus with hyperglycemia: Secondary | ICD-10-CM | POA: Insufficient documentation

## 2019-04-20 DIAGNOSIS — I5022 Chronic systolic (congestive) heart failure: Secondary | ICD-10-CM

## 2019-04-20 DIAGNOSIS — Z8249 Family history of ischemic heart disease and other diseases of the circulatory system: Secondary | ICD-10-CM | POA: Diagnosis not present

## 2019-04-20 DIAGNOSIS — Z794 Long term (current) use of insulin: Secondary | ICD-10-CM | POA: Insufficient documentation

## 2019-04-20 DIAGNOSIS — G8929 Other chronic pain: Secondary | ICD-10-CM | POA: Insufficient documentation

## 2019-04-20 DIAGNOSIS — I110 Hypertensive heart disease with heart failure: Secondary | ICD-10-CM | POA: Diagnosis not present

## 2019-04-20 DIAGNOSIS — Z79899 Other long term (current) drug therapy: Secondary | ICD-10-CM | POA: Diagnosis not present

## 2019-04-20 DIAGNOSIS — I10 Essential (primary) hypertension: Secondary | ICD-10-CM

## 2019-04-20 DIAGNOSIS — I428 Other cardiomyopathies: Secondary | ICD-10-CM | POA: Diagnosis not present

## 2019-04-20 DIAGNOSIS — R9431 Abnormal electrocardiogram [ECG] [EKG]: Secondary | ICD-10-CM | POA: Diagnosis not present

## 2019-04-20 MED ORDER — CARVEDILOL 6.25 MG PO TABS: 6.2500 mg | ORAL_TABLET | Freq: Two times a day (BID) | ORAL | 3 refills | Status: DC

## 2019-04-20 NOTE — Progress Notes (Signed)
Paramedicine Encounter   Patient ID: Cheryl Mercer , female,   DOB: 09/22/1985,32 y.o.,  MRN: [id number]   Met patient in clinic today with provider.   Weight @ clinic-143 B/p-136/90 p-99 sp02-99  Pt reports today she is feeling better today. She had episode of slight swelling to her ankle over the wknd. She went to elevate her feet and it subsided.  She does feel palpitations some, still some dizziness at times.  Her carvedilol will be increased to 6.25mg BID.  Her CBG's still running 200-300.  CBG AM-274  Katie Lynch, EMT-Paramedic [phone] 04/20/2019       

## 2019-04-20 NOTE — Telephone Encounter (Signed)
Pt did not call me back by end of day to allow for home visit. Will see her in clinic on Wednesday.   Marylouise Stacks, EMT-Paramedic  04/20/19

## 2019-04-20 NOTE — Progress Notes (Signed)
04/20/2019 [place]   Jan 15, 1986  [id number]  Primary Physician Lorelee Market, MD Primary Cardiologist: Dr. Haroldine Laws Electrophysiologist: None   Reason for Visit/CC: f/u for chronic systolic HF/ NICM and HTN  HPI:  Ms.Carrizalesis a 33 year old hispanic female with a past medical history of hypertension and poorly controlled type 2 diabetes mellitus, recently admitted to the hospital 03/2019 w/ SOB and diagnosed w/ CHF. In the ED, she was tachycardic and hypertensive. BNP 1,418.4. DM poorly controlled w/ Hgb A1c >13. She was diuresed w/ IV Lasix and started on DM medications. 2D echo showed severely reduced LVEF at 10-15% and moderately reduced RV. RHC/LHC  03/25/19 showed no CAD. Well compensated hemodynamics with low filling pressures. 03/25/19 cMRI w/ EF at 15% and no significant LGE. Started on guidelines directed medical therapy>>Entresto, Jardiance, spironolactone and digoxin. Lasix has been discontinued. At last OV, Coreg was added to regimen for tachycardia and HTN.  She is back to clinic today w/ parmedicine tech, who assist w/ meds. Doing well. Weight stable. Has been compliant w/ daily weights. Admits that it is hard for her to avoid salt given (her words) "the usual Hispanic diet". No weight gain or LEE however. Sleeps with 1 pillow. No PND or orthopnea. Has been checking blood sugars at home, which remain high but much better compared to hospital levels, down from 500s>>200s. Compliant w/ Insulin and Jardiance. No UTI symptoms. BMP was checked last visit and SCr/BUN and K were WNL. EKG shows elevated HR in the 90s, sinus. BP remains mildly elevated at 136/90. Tolerating low dose Coreg ok. No fatigue or other side effects.    Cardiac Studies   2D Echo 03/23/19  IMPRESSIONS    1. The left ventricle has a visually estimated ejection fraction of 10-15%%. The cavity size was severely dilated. Left ventricular diastolic Doppler parameters are consistent with  restrictive filling.  2. The right ventricle has moderately reduced systolic function. The cavity was normal. There is no increase in right ventricular wall thickness. Right ventricular systolic pressure is mildly elevated with an estimated pressure of 33.7 mmHg.  3. Left atrial size was moderately dilated.  4. Right atrial size was mildly dilated.  5. No evidence of mitral valve stenosis.  6. No stenosis of the aortic valve.  7. The aorta is normal in size and structure.  8. The aortic root and ascending aorta are normal in size and structure.  9. The inferior vena cava was dilated in size with <50% respiratory variability.  R/LHC 03/25/19 Procedures  RIGHT/LEFT HEART CATH AND CORONARY ANGIOGRAPHY  Conclusion  Findings:  Ao = 122/88 (104) LV = 125/15 RA = 2 RV = 35/6 PA = 37/14 (25) PCW = 18 Fick cardiac output/index = 5.3/3.2 SVR = 1543 PVR = 1.3 WU Ao sat = 99% PA sat = 74%, 76%  Assessment: 1. Normal coronary arteries 2. 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 Well-compensated hemodynamics     Current Meds  Medication Sig  . acetaminophen (TYLENOL) 500 MG tablet Take 1 tablet (500 mg total) by mouth every 6 (six) hours as needed. (Patient taking differently: Take 500 mg by mouth every 6 (six) hours as needed for mild pain. )  . atorvastatin (LIPITOR) 40 MG tablet Take 1 tablet (40 mg total) by mouth daily at 6 PM.  . blood glucose meter kit and supplies Dispense based on patient and insurance preference. Check blood sugar 4 times daily, three times before meals and once before bedtime. (FOR ICD-10 E10.9,  E11.9).  . butalbital-acetaminophen-caffeine (FIORICET) 50-325-40 MG tablet Take by mouth 2 (two) times daily as needed for headache.  . carvedilol (COREG) 6.25 MG tablet Take 1 tablet (6.25 mg total) by mouth 2 (two) times daily with a meal.  . digoxin (LANOXIN) 0.125 MG tablet Take 1 tablet (0.125 mg total) by mouth daily.  . empagliflozin (JARDIANCE) 10 MG TABS tablet Take 10 mg by  mouth daily.  . insulin aspart (NOVOLOG FLEXPEN) 100 UNIT/ML FlexPen Inject 10 Units into the skin 3 (three) times daily with meals.  . Insulin Glargine (LANTUS) 100 UNIT/ML Solostar Pen Inject 40 Units into the skin daily.  . Insulin Pen Needle 31G X 5 MM MISC BD Pen Needles- brand specific Inject insulin via insulin pen 6 x daily  . metFORMIN (GLUCOPHAGE) 500 MG tablet Take 1 tablet (500 mg total) by mouth 2 (two) times daily with a meal.  . PARoxetine (PAXIL) 10 MG tablet Take 10 mg by mouth daily.  . rizatriptan (MAXALT) 10 MG tablet Take 10 mg by mouth as needed for migraine. May repeat in 2 hours if needed  . sacubitril-valsartan (ENTRESTO) 24-26 MG Take 1 tablet by mouth 2 (two) times daily.  Marland Kitchen spironolactone (ALDACTONE) 25 MG tablet Take 1 tablet (25 mg total) by mouth daily.  . [DISCONTINUED] carvedilol (COREG) 3.125 MG tablet Take 1 tablet (3.125 mg total) by mouth 2 (two) times daily with a meal.   Allergies  Allergen Reactions  . Ibuprofen Anaphylaxis   Past Medical History:  Diagnosis Date  . Chronic back pain   . Diabetes mellitus without complication ([place])   . Migraines   . No pertinent past medical history   . Pregnancy induced hypertension    Family History  Problem Relation Age of Onset  . Hypertension Mother   . Anesthesia problems Neg Hx    Past Surgical History:  Procedure Laterality Date  . CERVICAL BIOPSY  W/ LOOP ELECTRODE EXCISION  2009  . Laparoscopic tubal sterilization with Falope rings.  2009  . RIGHT/LEFT HEART CATH AND CORONARY ANGIOGRAPHY N/A 03/25/2019   Procedure: RIGHT/LEFT HEART CATH AND CORONARY ANGIOGRAPHY;  Surgeon: Jolaine Artist, MD;  Location: [facility];  Service: Cardiovascular;  Laterality: N/A;  . TUBAL LIGATION     Social History   Socioeconomic History  . Marital status: Married    Spouse name: Not on file  . Number of children: 4  . Years of education: Not on file  . Highest education level: Not on file   Occupational History  . Not on file  Social Needs  . Financial resource strain: Somewhat hard  . Food insecurity    Worry: Sometimes true    Inability: Never true  . Transportation needs    Medical: No    Non-medical: No  Tobacco Use  . Smoking status: Never Smoker  . Smokeless tobacco: Never Used  Substance and Sexual Activity  . Alcohol use: No    Alcohol/week: 6.0 standard drinks    Types: 6 Shots of liquor per week  . Drug use: No    Types: Marijuana  . Sexual activity: Not Currently    Birth control/protection: Surgical    Comment: band placed around tubes in 2009 surgically by Dr. Glo Herring  Lifestyle  . Physical activity    Days per week: Not on file    Minutes per session: Not on file  . Stress: Not on file  Relationships  . Social Herbalist on  phone: Not on file    Gets together: Not on file    Attends religious service: Not on file    Active member of club or organization: Not on file    Attends meetings of clubs or organizations: Not on file    Relationship status: Not on file  . Intimate partner violence    Fear of current or ex partner: Not on file    Emotionally abused: Not on file    Physically abused: Not on file    Forced sexual activity: Not on file  Other Topics Concern  . Not on file  Social History Narrative  . Not on file     Lipid Panel     Component Value Date/Time   CHOL 151 03/24/2019 0218   TRIG 92 03/24/2019 0218   HDL 38 (L) 03/24/2019 0218   CHOLHDL 4.0 03/24/2019 0218   VLDL 18 03/24/2019 0218   LDLCALC 95 03/24/2019 0218    Review of Systems: General: negative for chills, fever, night sweats or weight changes.  Cardiovascular: negative for chest pain, dyspnea on exertion, edema, orthopnea, palpitations, paroxysmal nocturnal dyspnea or shortness of breath Dermatological: negative for rash Respiratory: negative for cough or wheezing Urologic: negative for hematuria Abdominal: negative for nausea, vomiting,  diarrhea, bright red blood per rectum, melena, or hematemesis Neurologic: negative for visual changes, syncope, or dizziness All other systems reviewed and are otherwise negative except as noted above.   Physical Exam:  Blood pressure 136/90, pulse 93, weight 64.9 kg (143 lb), SpO2 99 %.  General appearance: alert, cooperative and no distress Neck: no carotid bruit and no JVD Lungs: clear to auscultation bilaterally Heart: regular rate and rhythm, S1, S2 normal, no murmur, click, rub or gallop Extremities: extremities normal, atraumatic, no cyanosis or edema Pulses: 2+ and symmetric Skin: Skin color, texture, turgor normal. No rashes or lesions Neurologic: Grossly normal  EKG NSR 99 bpm -- personally reviewed   ASSESSMENT AND PLAN:   1. Chronic Systolic HF/ NICM: 2D echo 03/2019 showed severely reduced LVEF at 10-15% and moderately reduced RV. RHC/LHC showed no CAD. Well compensated hemodynamics with low filling pressures. cMRI w/ EF at 15% and no significant LGE. Suspect CM likely caused by poorly controlled HTN and DM. -Euvolemic on exam today. -NYHA Functional Class II symptoms -Continue Entrestro 24-26 -Continue Jardiance 10 -Continue Spironolactone 25 -Continue Digoxin 0.125 -Will further titrate Coreg to 6.25 mg BID for elevated HR and HTN. Target HR < 80. May ultimately need Corlanor if unable to achieve target HR goal and/or unable to tolerate escalating doses of  blocker -Given Coreg increase, will hold off on up titration of Entresto today.  -BMP last visit w/ stable SCr and K w/ ARNI, SLGT2i and Spiro -Continue daily weights and low sodium diet -Will ultimately need repeat echocardiogram, 3 months after treatment w/ maximumly tolerated HF regimen w/ intent to refer to EP for ICD if EF remains < 35%.   2. HTN: remains elevated. BP goal given systolic HF and DM is < 159/45.  - Will increase Coreg to 6.25 mg BID for BP and HR control -Continue Entresto and Spironolactone  -Importance of low salt diet stressed   3. Poorly Controlled T2DM: recent hgb a1c 11.2 - on Jardiance. No UTI symptoms - on Insulin - continue home glucose monitoring  - annual eye exam and regular home foot inspection recommended. Has eye exam this month  - further management per PCP/ endocrinologist    Follow-Up in 2-3 weeks  w/ APP for further optimization of HF regimen.    Ladoris Gene, MHS [facility] 04/20/2019 11:45 AM

## 2019-04-20 NOTE — Patient Instructions (Signed)
INCREASE Carvedilol to 6.25mg  twice daily.  Follow up in 2-3 weeks.  Do the following things EVERYDAY: 1) Weigh yourself in the morning before breakfast. Write it down and keep it in a log. 2) Take your medicines as prescribed 3) Eat low salt foods-Limit salt (sodium) to 2000 mg per day.  4) Stay as active as you can everyday 5) Limit all fluids for the day to less than 2 liters

## 2019-05-04 ENCOUNTER — Telehealth (HOSPITAL_COMMUNITY): Payer: Self-pay

## 2019-05-04 ENCOUNTER — Other Ambulatory Visit (HOSPITAL_COMMUNITY): Payer: Self-pay

## 2019-05-04 NOTE — Telephone Encounter (Signed)
I called pt but it went straight message stating the person you are calling right now is not available. This happened again last week as well.   Marylouise Stacks, EMT-Paramedic  05/04/19

## 2019-05-04 NOTE — Progress Notes (Signed)
Late entry-----04/28/2019 Arrived at pts home at our scheduled appointment but nobody answered the door, no car in driveway, no sound of anyone being home and the phone call went straight to a message that was stating called is not available.   Marylouise Stacks, EMT-Paramedic  05/04/19

## 2019-05-06 ENCOUNTER — Telehealth (HOSPITAL_COMMUNITY): Payer: Self-pay | Admitting: Licensed Clinical Social Worker

## 2019-05-06 NOTE — Telephone Encounter (Signed)
CSW informed by community paramedic that she has been unable to get a hold of patient and went by her home with no sign of her.  CSW attempted to reach out by phone but it went straight to an error message and was unable to leave a voicemail- CSW also sent text message to number- awaiting return call  Jorge Ny, [place] Worker [facility] Desk#: [phone] Cell#: [phone]

## 2019-05-10 ENCOUNTER — Telehealth (HOSPITAL_COMMUNITY): Payer: Self-pay

## 2019-05-10 ENCOUNTER — Encounter (HOSPITAL_COMMUNITY): Payer: Medicaid Other

## 2019-05-11 NOTE — Telephone Encounter (Signed)
Pt missed her clinic appointment, I tried calling again and her phone goes straight to a message unable to take calls.   Marylouise Stacks, EMT-Paramedic  05/11/19

## 2019-05-16 ENCOUNTER — Telehealth (HOSPITAL_COMMUNITY): Payer: Self-pay | Admitting: Licensed Clinical Social Worker

## 2019-05-16 ENCOUNTER — Telehealth (HOSPITAL_COMMUNITY): Payer: Self-pay

## 2019-05-16 NOTE — Telephone Encounter (Signed)
CSW and Clinical biochemist have attempted to reach patient multiple times over the past 2 weeks with no response and no return call.  Pt currently being discharged from [facility]- can be re-enrolled if she gets back in contact with clinic.  Cheryl Ny, LCSW Clinical Social Worker [facility] Desk#: [phone] Cell#: [phone]

## 2019-05-16 NOTE — Telephone Encounter (Signed)
Tried to call her again today but her phone goes straight to message of unable to reach the subscriber.   Marylouise Stacks, EMT-Paramedic  05/16/19

## 2019-09-06 ENCOUNTER — Inpatient Hospital Stay
Admission: EM | Admit: 2019-09-06 | Discharge: 2019-09-08 | DRG: 177 | Disposition: A | Discharge status: Home / Self Care | Payer: Medicaid Other | Attending: Internal Medicine | Admitting: Internal Medicine

## 2019-09-06 ENCOUNTER — Other Ambulatory Visit: Payer: Self-pay

## 2019-09-06 ENCOUNTER — Emergency Department: Payer: Medicaid Other

## 2019-09-06 ENCOUNTER — Encounter: Payer: Self-pay | Admitting: Emergency Medicine

## 2019-09-06 DIAGNOSIS — M549 Dorsalgia, unspecified: Secondary | ICD-10-CM

## 2019-09-06 DIAGNOSIS — J189 Pneumonia, unspecified organism: Secondary | ICD-10-CM | POA: Diagnosis present

## 2019-09-06 DIAGNOSIS — E785 Hyperlipidemia, unspecified: Secondary | ICD-10-CM | POA: Diagnosis present

## 2019-09-06 DIAGNOSIS — E1165 Type 2 diabetes mellitus with hyperglycemia: Secondary | ICD-10-CM | POA: Diagnosis present

## 2019-09-06 DIAGNOSIS — I110 Hypertensive heart disease with heart failure: Secondary | ICD-10-CM | POA: Diagnosis present

## 2019-09-06 DIAGNOSIS — R0602 Shortness of breath: Secondary | ICD-10-CM | POA: Diagnosis not present

## 2019-09-06 DIAGNOSIS — R509 Fever, unspecified: Secondary | ICD-10-CM | POA: Diagnosis not present

## 2019-09-06 DIAGNOSIS — R000 Tachycardia, unspecified: Secondary | ICD-10-CM

## 2019-09-06 DIAGNOSIS — I5023 Acute on chronic systolic (congestive) heart failure: Secondary | ICD-10-CM

## 2019-09-06 DIAGNOSIS — I5042 Chronic combined systolic (congestive) and diastolic (congestive) heart failure: Secondary | ICD-10-CM | POA: Diagnosis present

## 2019-09-06 DIAGNOSIS — Z794 Long term (current) use of insulin: Secondary | ICD-10-CM

## 2019-09-06 DIAGNOSIS — J1282 Pneumonia due to coronavirus disease 2019: Secondary | ICD-10-CM | POA: Diagnosis present

## 2019-09-06 DIAGNOSIS — Z8249 Family history of ischemic heart disease and other diseases of the circulatory system: Secondary | ICD-10-CM

## 2019-09-06 DIAGNOSIS — Z79899 Other long term (current) drug therapy: Secondary | ICD-10-CM

## 2019-09-06 DIAGNOSIS — J129 Viral pneumonia, unspecified: Secondary | ICD-10-CM

## 2019-09-06 DIAGNOSIS — U071 COVID-19: Principal | ICD-10-CM | POA: Diagnosis present

## 2019-09-06 DIAGNOSIS — I5022 Chronic systolic (congestive) heart failure: Secondary | ICD-10-CM

## 2019-09-06 DIAGNOSIS — Z888 Allergy status to other drugs, medicaments and biological substances status: Secondary | ICD-10-CM

## 2019-09-06 DIAGNOSIS — I248 Other forms of acute ischemic heart disease: Secondary | ICD-10-CM | POA: Diagnosis present

## 2019-09-06 DIAGNOSIS — R05 Cough: Secondary | ICD-10-CM | POA: Diagnosis not present

## 2019-09-06 DIAGNOSIS — E119 Type 2 diabetes mellitus without complications: Secondary | ICD-10-CM | POA: Diagnosis present

## 2019-09-06 DIAGNOSIS — I10 Essential (primary) hypertension: Secondary | ICD-10-CM

## 2019-09-06 DIAGNOSIS — Z833 Family history of diabetes mellitus: Secondary | ICD-10-CM

## 2019-09-06 LAB — COMPREHENSIVE METABOLIC PANEL
ALT: 21 U/L (ref 0–44)
AST: 60 U/L — ABNORMAL HIGH (ref 15–41)
Albumin: 2.8 g/dL — ABNORMAL LOW (ref 3.5–5.0)
Alkaline Phosphatase: 152 U/L — ABNORMAL HIGH (ref 38–126)
Anion gap: 14 (ref 5–15)
BUN: <5 mg/dL — ABNORMAL LOW (ref 6–20)
CO2: 23 mmol/L (ref 22–32)
Calcium: 8.4 mg/dL — ABNORMAL LOW (ref 8.9–10.3)
Chloride: 93 mmol/L — ABNORMAL LOW (ref 98–111)
Creatinine, Ser: 0.57 mg/dL (ref 0.44–1.00)
GFR calc Af Amer: >60 mL/min (ref >60)
GFR calc non Af Amer: >60 mL/min (ref >60)
Glucose, Bld: 403 mg/dL — ABNORMAL HIGH (ref 70–99)
Potassium: 3.7 mmol/L (ref 3.5–5.1)
Sodium: 130 mmol/L — ABNORMAL LOW (ref 135–145)
Total Bilirubin: 0.9 mg/dL (ref 0.3–1.2)
Total Protein: 6.7 g/dL (ref 6.5–8.1)

## 2019-09-06 LAB — BRAIN NATRIURETIC PEPTIDE: B Natriuretic Peptide: 753 pg/mL — ABNORMAL HIGH (ref 0.0–100.0)

## 2019-09-06 LAB — CBC WITH DIFFERENTIAL/PLATELET
Abs Immature Granulocytes: 0.1 10*3/uL — ABNORMAL HIGH (ref 0.00–0.07)
Basophils Absolute: 0 10*3/uL (ref 0.0–0.1)
Basophils Relative: 0 %
Eosinophils Absolute: 0.1 10*3/uL (ref 0.0–0.5)
Eosinophils Relative: 1 %
HCT: 36.8 % (ref 36.0–46.0)
Hemoglobin: 12.6 g/dL (ref 12.0–15.0)
Immature Granulocytes: 1 %
Lymphocytes Relative: 25 %
Lymphs Abs: 2.4 10*3/uL (ref 0.7–4.0)
MCH: 30.4 pg (ref 26.0–34.0)
MCHC: 34.2 g/dL (ref 30.0–36.0)
MCV: 88.7 fL (ref 80.0–100.0)
Monocytes Absolute: 0.6 10*3/uL (ref 0.1–1.0)
Monocytes Relative: 6 %
Neutro Abs: 6.5 10*3/uL (ref 1.7–7.7)
Neutrophils Relative %: 67 %
Platelets: 334 10*3/uL (ref 150–400)
RBC: 4.15 MIL/uL (ref 3.87–5.11)
RDW: 13.6 % (ref 11.5–15.5)
WBC: 9.7 10*3/uL (ref 4.0–10.5)
nRBC: 0 % (ref 0.0–0.2)

## 2019-09-06 LAB — TROPONIN I (HIGH SENSITIVITY): Troponin I (High Sensitivity): 41 ng/L — ABNORMAL HIGH (ref <18)

## 2019-09-06 LAB — URINALYSIS, COMPLETE (UACMP) WITH MICROSCOPIC
Bacteria, UA: NONE SEEN
Bilirubin Urine: NEGATIVE
Glucose, UA: >=500 mg/dL — AB
Ketones, ur: NEGATIVE mg/dL
Leukocytes,Ua: NEGATIVE
Nitrite: NEGATIVE
Protein, ur: 100 mg/dL — AB
Specific Gravity, Urine: 1.03 (ref 1.005–1.030)
pH: 7 (ref 5.0–8.0)

## 2019-09-06 LAB — FIBRIN DERIVATIVES D-DIMER (ARMC ONLY): Fibrin derivatives D-dimer (ARMC): 1383.53 ng/mL (FEU) — ABNORMAL HIGH (ref 0.00–499.00)

## 2019-09-06 LAB — LACTIC ACID, PLASMA: Lactic Acid, Venous: 2.6 mmol/L (ref 0.5–1.9)

## 2019-09-06 MED ORDER — IOHEXOL 350 MG/ML SOLN
100.0000 mL | Freq: Once | INTRAVENOUS | Status: AC | PRN
  Administered 2019-09-06: 100 mL via INTRAVENOUS

## 2019-09-06 MED ORDER — TRAMADOL HCL 50 MG PO TABS
50.0000 mg | ORAL_TABLET | Freq: Once | ORAL | Status: AC
  Administered 2019-09-06: 22:00:00 50 mg via ORAL
  Filled 2019-09-06: qty 1

## 2019-09-06 MED ORDER — AZITHROMYCIN 500 MG PO TABS
500.0000 mg | ORAL_TABLET | Freq: Once | ORAL | Status: AC
  Administered 2019-09-06: 500 mg via ORAL
  Filled 2019-09-06: qty 1

## 2019-09-06 MED ORDER — SODIUM CHLORIDE 0.9 % IV SOLN
1.0000 g | Freq: Once | INTRAVENOUS | Status: AC
  Administered 2019-09-06: 1 g via INTRAVENOUS
  Filled 2019-09-06: qty 10

## 2019-09-06 NOTE — ED Notes (Signed)
Ambulated pt per PA request. PT oxygen remained 99%-100%. PT states had a little trouble walking d/t pain in back. Morrie Sheldon, [place] made aware.

## 2019-09-06 NOTE — ED Provider Notes (Signed)
Dr. Dolores Frame to follow-up on patient and evaluation, labs, and reassessment. Dr. Dolores Frame attending.   Sharyn Creamer, MD 09/06/19 2324

## 2019-09-06 NOTE — ED Triage Notes (Signed)
Pt arrived via POV with reports of headache, cough, chills, fever, loss of taste. Pt states her sxs started 1 week ago.  Pt was around COVID positive pt [facility].

## 2019-09-06 NOTE — ED Notes (Signed)
Pt to the er for covid symptoms. Cough, SOB, body aches. Pt was exposed on 12/31 to covid.

## 2019-09-06 NOTE — ED Notes (Signed)
Pt to room 2.  Report off to shannon rn.

## 2019-09-06 NOTE — ED Provider Notes (Signed)
[facility] Emergency Department Provider Note  ____________________________________________  Time seen: Approximately 11:15 PM  I have reviewed the triage vital signs and the nursing notes.   HISTORY  Chief Complaint Cough and Headache    HPI Cheryl Mercer is a 34 y.o. female that presents to the emergency department for evaluation of loss of taste and smell for 1 week and body aches, nasal congestion, back pain, shortness of breath, productive cough with yellow sputum and streaks of blood for 2 days.  Patient's back pain extends from the left side of her neck to her left low back.  Patient was diagnosed with congestive heart failure the summer but has lost her Medicaid and has not been able to follow-up with cardiology.  She has noticed her ankles a bit more swollen the last couple of days.  She is still taking her prescribed made medications.  She takes Coreg for her tachycardia but not did not take medication today.  She has not taken any of her medications today.  She was with someone on New Year's Eve that had Covid.  She does not believe that she has had a fever.  Patient is on her menstrual cycle.  She has had her tubes tied.  No chest pain, vomiting, abdominal pain, diarrhea.   Past Medical History:  Diagnosis Date  . Chronic back pain   . Diabetes mellitus without complication ([place])   . Migraines   . No pertinent past medical history   . Pregnancy induced hypertension     Patient Active Problem List   Diagnosis Date Noted  . Essential hypertension 03/24/2019  . Uncontrolled type 2 diabetes mellitus with hyperglycemia ([place]) 03/23/2019  . Peripheral edema 03/23/2019  . Cardiomegaly 03/23/2019  . Acute combined systolic and diastolic congestive heart failure ([place]) 03/23/2019  . Proteinuria 03/23/2019  . Acute bacterial tonsillitis 05/18/2013    Class: Acute  . UTI (lower urinary tract infection) 07/30/2012    Past Surgical History:   Procedure Laterality Date  . CERVICAL BIOPSY  W/ LOOP ELECTRODE EXCISION  2009  . Laparoscopic tubal sterilization with Falope rings.  2009  . RIGHT/LEFT HEART CATH AND CORONARY ANGIOGRAPHY N/A 03/25/2019   Procedure: RIGHT/LEFT HEART CATH AND CORONARY ANGIOGRAPHY;  Surgeon: Jolaine Artist, MD;  Location: [place] [facility];  Service: Cardiovascular;  Laterality: N/A;  . TUBAL LIGATION      Prior to Admission medications   Medication Sig Start Date End Date Taking? Authorizing Provider  acetaminophen (TYLENOL) 500 MG tablet Take 1 tablet (500 mg total) by mouth every 6 (six) hours as needed. Patient taking differently: Take 500 mg by mouth every 6 (six) hours as needed for mild pain.  07/19/15   Domenic Moras, PA-C  atorvastatin (LIPITOR) 40 MG tablet Take 1 tablet (40 mg total) by mouth daily at 6 PM. 03/28/19   Mariel Aloe, MD  blood glucose meter kit and supplies Dispense based on patient and insurance preference. Check blood sugar 4 times daily, three times before meals and once before bedtime. (FOR ICD-10 E10.9, E11.9). 03/28/19   Mariel Aloe, MD  butalbital-acetaminophen-caffeine (FIORICET) [phone] MG tablet Take by mouth 2 (two) times daily as needed for headache.    [provider]  carvedilol (COREG) 6.25 MG tablet Take 1 tablet (6.25 mg total) by mouth 2 (two) times daily with a meal. 04/20/19   Lyda Jester M, PA-C  digoxin (LANOXIN) 0.125 MG tablet Take 1 tablet (0.125 mg total) by mouth  daily. 03/29/19   Mariel Aloe, MD  empagliflozin (JARDIANCE) 10 MG TABS tablet Take 10 mg by mouth daily. 03/28/19   Mariel Aloe, MD  insulin aspart (NOVOLOG FLEXPEN) 100 UNIT/ML FlexPen Inject 10 Units into the skin 3 (three) times daily with meals. 03/28/19   Mariel Aloe, MD  Insulin Glargine (LANTUS) 100 UNIT/ML Solostar Pen Inject 40 Units into the skin daily. 03/28/19   Mariel Aloe, MD  Insulin Pen Needle 31G X 5 MM MISC BD Pen Needles- brand specific Inject  insulin via insulin pen 6 x daily 03/28/19   Mariel Aloe, MD  metFORMIN (GLUCOPHAGE) 500 MG tablet Take 1 tablet (500 mg total) by mouth 2 (two) times daily with a meal. 03/28/19   Mariel Aloe, MD  PARoxetine (PAXIL) 10 MG tablet Take 10 mg by mouth daily.    [provider]  rizatriptan (MAXALT) 10 MG tablet Take 10 mg by mouth as needed for migraine. May repeat in 2 hours if needed    [provider]  sacubitril-valsartan (ENTRESTO) 24-26 MG Take 1 tablet by mouth 2 (two) times daily. 03/28/19   Mariel Aloe, MD  spironolactone (ALDACTONE) 25 MG tablet Take 1 tablet (25 mg total) by mouth daily. 03/29/19   Mariel Aloe, MD    Allergies Ibuprofen  Family History  Problem Relation Age of Onset  . Hypertension Mother   . Anesthesia problems Neg Hx     Social History Social History   Tobacco Use  . Smoking status: Never Smoker  . Smokeless tobacco: Never Used  Substance Use Topics  . Alcohol use: No    Alcohol/week: 6.0 standard drinks    Types: 6 Shots of liquor per week  . Drug use: No    Types: Marijuana     Review of Systems  Constitutional: Positive for chills Eyes: No visual changes. No discharge. ENT: Positive for congestion and rhinorrhea. Cardiovascular: No chest pain. Respiratory: Positive for cough and SOB. Gastrointestinal: No abdominal pain.  No nausea, no vomiting.  No diarrhea.  No constipation. Musculoskeletal: Negative for musculoskeletal pain. Skin: Negative for rash, abrasions, lacerations, ecchymosis. Neurological: Positive for headache.   ____________________________________________   PHYSICAL EXAM:  VITAL SIGNS: ED Triage Vitals  Enc Vitals Group     BP 09/06/19 1914 140/87     Pulse Rate 09/06/19 1914 (!) 111     Resp 09/06/19 1914 18     Temp 09/06/19 1914 98.9 F (37.2 C)     Temp Source 09/06/19 1914 Oral     SpO2 09/06/19 1914 100 %     Weight 09/06/19 1916 164 lb (74.4 kg)     Height 09/06/19 1916 5'  (1.524 m)     Head Circumference --      Peak Flow --      Pain Score 09/06/19 1914 9     Pain Loc --      Pain Edu? --      Excl. in [place]? --      Constitutional: Alert and oriented. Well appearing and in no acute distress. Eyes: Conjunctivae are normal. PERRL. EOMI. No discharge. Head: Atraumatic. ENT: No frontal and maxillary sinus tenderness.      Ears: Tympanic membranes pearly gray with good landmarks. No discharge.      Nose: Mild congestion/rhinnorhea.      Mouth/Throat: Mucous membranes are moist. Oropharynx non-erythematous. Tonsils not enlarged. No exudates. Uvula midline. Neck: No stridor.   Hematological/Lymphatic/Immunilogical: No cervical  lymphadenopathy. Cardiovascular: Normal rate, regular rhythm.  Good peripheral circulation. Respiratory: Normal respiratory effort without tachypnea or retractions. Lungs CTAB. Good air entry to the bases with no decreased or absent breath sounds. Gastrointestinal: Bowel sounds 4 quadrants. Soft and nontender to palpation. No guarding or rigidity. No palpable masses. No distention. Musculoskeletal: Full range of motion to all extremities. No gross deformities appreciated. Neurologic:  Normal speech and language. No gross focal neurologic deficits are appreciated.  Skin:  Skin is warm, dry and intact. No rash noted. Psychiatric: Mood and affect are normal. Speech and behavior are normal. Patient exhibits appropriate insight and judgement.   ____________________________________________   LABS (all labs ordered are listed, but only abnormal results are displayed)  Labs Reviewed  URINALYSIS, COMPLETE (UACMP) WITH MICROSCOPIC - Abnormal; Notable for the following components:      Result Value   Color, Urine YELLOW (*)    APPearance CLEAR (*)    Glucose, UA >=500 (*)    Hgb urine dipstick SMALL (*)    Protein, ur 100 (*)    All other components within normal limits  CBC WITH DIFFERENTIAL/PLATELET - Abnormal; Notable for the  following components:   Abs Immature Granulocytes 0.10 (*)    All other components within normal limits  COMPREHENSIVE METABOLIC PANEL - Abnormal; Notable for the following components:   Sodium 130 (*)    Chloride 93 (*)    Glucose, Bld 403 (*)    BUN <5 (*)    Calcium 8.4 (*)    Albumin 2.8 (*)    AST 60 (*)    Alkaline Phosphatase 152 (*)    All other components within normal limits  LACTIC ACID, PLASMA - Abnormal; Notable for the following components:   Lactic Acid, Venous 2.6 (*)    All other components within normal limits  BRAIN NATRIURETIC PEPTIDE - Abnormal; Notable for the following components:   B Natriuretic Peptide 753.0 (*)    All other components within normal limits  FIBRIN DERIVATIVES D-DIMER ([facility] ONLY) - Abnormal; Notable for the following components:   Fibrin derivatives D-dimer ([facility]) 0,350.09 (*)    All other components within normal limits  TROPONIN I (HIGH SENSITIVITY) - Abnormal; Notable for the following components:   Troponin I (High Sensitivity) 41 (*)    All other components within normal limits  SARS CORONAVIRUS 2 (TAT 6-24 HRS)  LACTIC ACID, PLASMA  PREGNANCY, URINE  POC SARS CORONAVIRUS 2 AG -  ED  TROPONIN I (HIGH SENSITIVITY)   ____________________________________________  EKG   ____________________________________________  RADIOLOGY Robinette Haines, personally viewed and evaluated these images (plain radiographs) as part of my medical decision making, as well as reviewing the written report by the radiologist.  DG Chest Portable 1 View  Result Date: 09/06/2019 CLINICAL DATA:  Fever with cough and chills EXAM: PORTABLE CHEST 1 VIEW COMPARISON:  March 22, 2019 FINDINGS: Ill-defined airspace opacity is present in the left upper lobe. Lungs elsewhere clear. Heart is upper normal in size with pulmonary vascularity normal. No adenopathy. No bone lesions IMPRESSION: Ill-defined opacity left upper lobe, likely focal pneumonia. Lungs elsewhere  clear. Heart upper normal in size. No evident adenopathy. Electronically Signed   By: Lowella Grip III M.D.   On: 09/06/2019 20:31    ____________________________________________    PROCEDURES  Procedure(s) performed:    Procedures    Medications  cefTRIAXone (ROCEPHIN) 1 g in sodium chloride 0.9 % 100 mL IVPB (0 g Intravenous Stopped 09/06/19 2138)  traMADol (ULTRAM) tablet  50 mg (50 mg Oral Given 09/06/19 2155)  azithromycin (ZITHROMAX) tablet 500 mg (500 mg Oral Given 09/06/19 2245)     ____________________________________________   INITIAL IMPRESSION / ASSESSMENT AND PLAN / ED COURSE  Pertinent labs & imaging results that were available during my care of the patient were reviewed by me and considered in my medical decision making (see chart for details).  Review of the [place] CSRS was performed in accordance of the [place] prior to dispensing any controlled drugs.  Differential diagnosis includes, but is not limited to, ACS, aortic dissection, pulmonary embolism, cardiac tamponade, pneumothorax, pneumonia, pericarditis, myocarditis, GI-related causes including esophagitis/gastritis, and musculoskeletal chest wall pain.     Patient presented to emergency department for evaluation of headache, nasal congestion, cough, shortness of breath.  Chest x-ray consistent with left sided pneumonia. POC Covid is negative. CBC within normal limits.  CMP remarkable for sodium 130, chloride, 93, glucose 4 3, BUN less than 5, calcium 8.4, albumin 2.8, AST 60, alk phos 152.  Lactate elevated at 2.6.  First troponin is 41.  D-dimer is 1383.  BNP 753.  Patient was given IV ceftriaxone and oral azithromycin for pneumonia.  Patient will be moved to the main side of the emergency department for further work-up and CT angiogram, as this side of the emergency department is closing. Report was given to Dr. Jacqualine Code and Dr. Beather Arbour.   Cheryl Mercer was evaluated in Emergency Department on 09/06/2019 for  the symptoms described in the history of present illness. She was evaluated in the context of the global COVID-19 pandemic, which necessitated consideration that the patient might be at risk for infection with the SARS-CoV-2 virus that causes COVID-19. Institutional protocols and algorithms that pertain to the evaluation of patients at risk for COVID-19 are in a state of rapid change based on information released by regulatory bodies including the CDC and federal and state organizations. These policies and algorithms were followed during the patient's care in the ED.   ____________________________________________  FINAL CLINICAL IMPRESSION(S) / ED DIAGNOSES  Final diagnoses:  None      NEW MEDICATIONS STARTED DURING THIS VISIT:  ED Discharge Orders    None          This chart was dictated using voice recognition software/Dragon. Despite best efforts to proofread, errors can occur which can change the meaning. Any change was purely unintentional.    Laban Emperor, PA-C 09/06/19 2328    Delman Kitten, MD 09/06/19 2328

## 2019-09-06 NOTE — ED Notes (Signed)
poct covid negative

## 2019-09-06 NOTE — ED Notes (Signed)
Pt transported to CT scan.

## 2019-09-07 DIAGNOSIS — R509 Fever, unspecified: Secondary | ICD-10-CM | POA: Diagnosis not present

## 2019-09-07 DIAGNOSIS — I5022 Chronic systolic (congestive) heart failure: Secondary | ICD-10-CM

## 2019-09-07 DIAGNOSIS — I5042 Chronic combined systolic (congestive) and diastolic (congestive) heart failure: Secondary | ICD-10-CM | POA: Diagnosis not present

## 2019-09-07 DIAGNOSIS — Z833 Family history of diabetes mellitus: Secondary | ICD-10-CM | POA: Diagnosis not present

## 2019-09-07 DIAGNOSIS — I5023 Acute on chronic systolic (congestive) heart failure: Secondary | ICD-10-CM

## 2019-09-07 DIAGNOSIS — J189 Pneumonia, unspecified organism: Secondary | ICD-10-CM | POA: Diagnosis not present

## 2019-09-07 DIAGNOSIS — E785 Hyperlipidemia, unspecified: Secondary | ICD-10-CM | POA: Diagnosis not present

## 2019-09-07 DIAGNOSIS — U071 COVID-19: Principal | ICD-10-CM

## 2019-09-07 DIAGNOSIS — Z888 Allergy status to other drugs, medicaments and biological substances status: Secondary | ICD-10-CM | POA: Diagnosis not present

## 2019-09-07 DIAGNOSIS — J1282 Pneumonia due to coronavirus disease 2019: Secondary | ICD-10-CM | POA: Diagnosis present

## 2019-09-07 DIAGNOSIS — J129 Viral pneumonia, unspecified: Secondary | ICD-10-CM | POA: Diagnosis not present

## 2019-09-07 DIAGNOSIS — I10 Essential (primary) hypertension: Secondary | ICD-10-CM

## 2019-09-07 DIAGNOSIS — M546 Pain in thoracic spine: Secondary | ICD-10-CM | POA: Diagnosis not present

## 2019-09-07 DIAGNOSIS — E1165 Type 2 diabetes mellitus with hyperglycemia: Secondary | ICD-10-CM

## 2019-09-07 DIAGNOSIS — R0602 Shortness of breath: Secondary | ICD-10-CM | POA: Diagnosis not present

## 2019-09-07 DIAGNOSIS — Z794 Long term (current) use of insulin: Secondary | ICD-10-CM | POA: Diagnosis not present

## 2019-09-07 DIAGNOSIS — M549 Dorsalgia, unspecified: Secondary | ICD-10-CM | POA: Diagnosis present

## 2019-09-07 DIAGNOSIS — R000 Tachycardia, unspecified: Secondary | ICD-10-CM | POA: Diagnosis not present

## 2019-09-07 DIAGNOSIS — R05 Cough: Secondary | ICD-10-CM | POA: Diagnosis not present

## 2019-09-07 DIAGNOSIS — Z8249 Family history of ischemic heart disease and other diseases of the circulatory system: Secondary | ICD-10-CM | POA: Diagnosis not present

## 2019-09-07 DIAGNOSIS — I110 Hypertensive heart disease with heart failure: Secondary | ICD-10-CM | POA: Diagnosis present

## 2019-09-07 DIAGNOSIS — I248 Other forms of acute ischemic heart disease: Secondary | ICD-10-CM | POA: Diagnosis present

## 2019-09-07 DIAGNOSIS — Z79899 Other long term (current) drug therapy: Secondary | ICD-10-CM | POA: Diagnosis not present

## 2019-09-07 HISTORY — DX: Chronic combined systolic (congestive) and diastolic (congestive) heart failure: I50.42

## 2019-09-07 LAB — COMPREHENSIVE METABOLIC PANEL
ALT: 20 U/L (ref 0–44)
AST: 60 U/L — ABNORMAL HIGH (ref 15–41)
Albumin: 2.5 g/dL — ABNORMAL LOW (ref 3.5–5.0)
Alkaline Phosphatase: 145 U/L — ABNORMAL HIGH (ref 38–126)
Anion gap: 8 (ref 5–15)
BUN: 6 mg/dL (ref 6–20)
CO2: 22 mmol/L (ref 22–32)
Calcium: 7.7 mg/dL — ABNORMAL LOW (ref 8.9–10.3)
Chloride: 100 mmol/L (ref 98–111)
Creatinine, Ser: 0.5 mg/dL (ref 0.44–1.00)
GFR calc Af Amer: >60 mL/min (ref >60)
GFR calc non Af Amer: >60 mL/min (ref >60)
Glucose, Bld: 355 mg/dL — ABNORMAL HIGH (ref 70–99)
Potassium: 3.8 mmol/L (ref 3.5–5.1)
Sodium: 130 mmol/L — ABNORMAL LOW (ref 135–145)
Total Bilirubin: 0.9 mg/dL (ref 0.3–1.2)
Total Protein: 6.6 g/dL (ref 6.5–8.1)

## 2019-09-07 LAB — C-REACTIVE PROTEIN
CRP: 8.1 mg/dL — ABNORMAL HIGH (ref <1.0)
CRP: 8 mg/dL — ABNORMAL HIGH (ref <1.0)

## 2019-09-07 LAB — CBC
HCT: 35.6 % — ABNORMAL LOW (ref 36.0–46.0)
Hemoglobin: 12.1 g/dL (ref 12.0–15.0)
MCH: 30.2 pg (ref 26.0–34.0)
MCHC: 34 g/dL (ref 30.0–36.0)
MCV: 88.8 fL (ref 80.0–100.0)
Platelets: 341 10*3/uL (ref 150–400)
RBC: 4.01 MIL/uL (ref 3.87–5.11)
RDW: 13.7 % (ref 11.5–15.5)
WBC: 10 10*3/uL (ref 4.0–10.5)
nRBC: 0.2 % (ref 0.0–0.2)

## 2019-09-07 LAB — GLUCOSE, CAPILLARY
Glucose-Capillary: 507 mg/dL (ref 70–99)
Glucose-Capillary: 459 mg/dL — ABNORMAL HIGH (ref 70–99)
Glucose-Capillary: 368 mg/dL — ABNORMAL HIGH (ref 70–99)
Glucose-Capillary: 405 mg/dL — ABNORMAL HIGH (ref 70–99)
Glucose-Capillary: 462 mg/dL — ABNORMAL HIGH (ref 70–99)
Glucose-Capillary: 333 mg/dL — ABNORMAL HIGH (ref 70–99)
Glucose-Capillary: 358 mg/dL — ABNORMAL HIGH (ref 70–99)

## 2019-09-07 LAB — TROPONIN I (HIGH SENSITIVITY)
Troponin I (High Sensitivity): 48 ng/L — ABNORMAL HIGH (ref <18)
Troponin I (High Sensitivity): 55 ng/L — ABNORMAL HIGH (ref <18)
Troponin I (High Sensitivity): 58 ng/L — ABNORMAL HIGH (ref <18)

## 2019-09-07 LAB — PROCALCITONIN: Procalcitonin: <0.1 ng/mL

## 2019-09-07 LAB — MAGNESIUM: Magnesium: 1.6 mg/dL — ABNORMAL LOW (ref 1.7–2.4)

## 2019-09-07 LAB — LACTATE DEHYDROGENASE: LDH: 295 U/L — ABNORMAL HIGH (ref 98–192)

## 2019-09-07 LAB — HEMOGLOBIN A1C
Hgb A1c MFr Bld: 12.4 % — ABNORMAL HIGH (ref 4.8–5.6)
Mean Plasma Glucose: 309.18 mg/dL

## 2019-09-07 LAB — LACTIC ACID, PLASMA
Lactic Acid, Venous: 2.2 mmol/L (ref 0.5–1.9)
Lactic Acid, Venous: 2.8 mmol/L (ref 0.5–1.9)
Lactic Acid, Venous: 3.4 mmol/L (ref 0.5–1.9)

## 2019-09-07 LAB — SARS CORONAVIRUS 2 (TAT 6-24 HRS): SARS Coronavirus 2: POSITIVE — AB

## 2019-09-07 LAB — POC SARS CORONAVIRUS 2 AG -  ED: SARS Coronavirus 2 Ag: NEGATIVE

## 2019-09-07 LAB — RESPIRATORY PANEL BY RT PCR (FLU A&B, COVID)
Influenza A by PCR: NEGATIVE
Influenza B by PCR: NEGATIVE
SARS Coronavirus 2 by RT PCR: NEGATIVE

## 2019-09-07 LAB — FIBRIN DERIVATIVES D-DIMER (ARMC ONLY): Fibrin derivatives D-dimer (ARMC): 1200.22 ng/mL (FEU) — ABNORMAL HIGH (ref 0.00–499.00)

## 2019-09-07 LAB — PREGNANCY, URINE: Preg Test, Ur: NEGATIVE

## 2019-09-07 MED ORDER — ENOXAPARIN SODIUM 40 MG/0.4ML ~~LOC~~ SOLN
40.0000 mg | SUBCUTANEOUS | Status: DC
  Administered 2019-09-07 – 2019-09-08 (×2): 40 mg via SUBCUTANEOUS
  Filled 2019-09-07 (×2): qty 0.4

## 2019-09-07 MED ORDER — SODIUM CHLORIDE 0.9 % IV SOLN
200.0000 mg | Freq: Once | INTRAVENOUS | Status: AC
  Administered 2019-09-07: 10:00:00 200 mg via INTRAVENOUS
  Filled 2019-09-07: qty 200
  Filled 2019-09-07: qty 40

## 2019-09-07 MED ORDER — METOPROLOL TARTRATE 5 MG/5ML IV SOLN
5.0000 mg | Freq: Once | INTRAVENOUS | Status: AC
  Administered 2019-09-07: 5 mg via INTRAVENOUS
  Filled 2019-09-07: qty 5

## 2019-09-07 MED ORDER — SODIUM CHLORIDE 0.9 % IV BOLUS
500.0000 mL | Freq: Once | INTRAVENOUS | Status: AC
  Administered 2019-09-07: 500 mL via INTRAVENOUS

## 2019-09-07 MED ORDER — INSULIN ASPART 100 UNIT/ML ~~LOC~~ SOLN
5.0000 [IU] | Freq: Three times a day (TID) | SUBCUTANEOUS | Status: DC
  Administered 2019-09-07 – 2019-09-08 (×3): 5 [IU] via SUBCUTANEOUS
  Filled 2019-09-07 (×3): qty 1

## 2019-09-07 MED ORDER — SODIUM CHLORIDE 0.9 % IV SOLN
INTRAVENOUS | Status: DC | PRN
  Administered 2019-09-07: 250 mL via INTRAVENOUS

## 2019-09-07 MED ORDER — INSULIN ASPART 100 UNIT/ML ~~LOC~~ SOLN
0.0000 [IU] | Freq: Every day | SUBCUTANEOUS | Status: DC
  Administered 2019-09-07: 21:00:00 5 [IU] via SUBCUTANEOUS
  Filled 2019-09-07: qty 1

## 2019-09-07 MED ORDER — SACUBITRIL-VALSARTAN 24-26 MG PO TABS
1.0000 | ORAL_TABLET | Freq: Two times a day (BID) | ORAL | Status: DC
  Administered 2019-09-07 – 2019-09-08 (×3): 1 via ORAL
  Filled 2019-09-07 (×4): qty 1

## 2019-09-07 MED ORDER — SODIUM CHLORIDE 0.9 % IV SOLN
1.0000 g | INTRAVENOUS | Status: DC
  Administered 2019-09-07: 1 g via INTRAVENOUS
  Filled 2019-09-07: qty 10
  Filled 2019-09-07: qty 1

## 2019-09-07 MED ORDER — ATORVASTATIN CALCIUM 20 MG PO TABS
40.0000 mg | ORAL_TABLET | Freq: Every day | ORAL | Status: DC
  Administered 2019-09-07: 17:00:00 40 mg via ORAL
  Filled 2019-09-07: qty 2

## 2019-09-07 MED ORDER — INSULIN DETEMIR 100 UNIT/ML ~~LOC~~ SOLN
15.0000 [IU] | Freq: Two times a day (BID) | SUBCUTANEOUS | Status: DC
  Administered 2019-09-07 – 2019-09-08 (×3): 15 [IU] via SUBCUTANEOUS
  Filled 2019-09-07 (×4): qty 0.15

## 2019-09-07 MED ORDER — SODIUM CHLORIDE 0.9 % IV SOLN
100.0000 mg | Freq: Every day | INTRAVENOUS | Status: DC
  Administered 2019-09-08: 11:00:00 100 mg via INTRAVENOUS
  Filled 2019-09-07: qty 20

## 2019-09-07 MED ORDER — DEXAMETHASONE SODIUM PHOSPHATE 10 MG/ML IJ SOLN
6.0000 mg | INTRAMUSCULAR | Status: DC
  Administered 2019-09-07: 06:00:00 6 mg via INTRAVENOUS
  Filled 2019-09-07: qty 1

## 2019-09-07 MED ORDER — METOPROLOL TARTRATE 5 MG/5ML IV SOLN
2.5000 mg | Freq: Once | INTRAVENOUS | Status: AC
  Administered 2019-09-07: 2.5 mg via INTRAVENOUS
  Filled 2019-09-07: qty 5

## 2019-09-07 MED ORDER — DIGOXIN 125 MCG PO TABS
0.1250 mg | ORAL_TABLET | Freq: Every day | ORAL | Status: DC
  Administered 2019-09-07 – 2019-09-08 (×2): 0.125 mg via ORAL
  Filled 2019-09-07 (×2): qty 1

## 2019-09-07 MED ORDER — INSULIN ASPART 100 UNIT/ML ~~LOC~~ SOLN
0.0000 [IU] | Freq: Three times a day (TID) | SUBCUTANEOUS | Status: DC
  Administered 2019-09-07: 11 [IU] via SUBCUTANEOUS
  Filled 2019-09-07: qty 1

## 2019-09-07 MED ORDER — SODIUM CHLORIDE 0.9 % IV SOLN
500.0000 mg | INTRAVENOUS | Status: DC
  Administered 2019-09-07: 500 mg via INTRAVENOUS
  Filled 2019-09-07 (×2): qty 500

## 2019-09-07 MED ORDER — LINAGLIPTIN 5 MG PO TABS
5.0000 mg | ORAL_TABLET | Freq: Every day | ORAL | Status: DC
  Administered 2019-09-07 – 2019-09-08 (×2): 5 mg via ORAL
  Filled 2019-09-07 (×2): qty 1

## 2019-09-07 MED ORDER — INSULIN ASPART 100 UNIT/ML ~~LOC~~ SOLN
20.0000 [IU] | Freq: Once | SUBCUTANEOUS | Status: AC
  Administered 2019-09-07: 12:00:00 20 [IU] via SUBCUTANEOUS

## 2019-09-07 MED ORDER — ACETAMINOPHEN 500 MG PO TABS
500.0000 mg | ORAL_TABLET | Freq: Four times a day (QID) | ORAL | Status: DC | PRN
  Administered 2019-09-07: 07:00:00 500 mg via ORAL
  Filled 2019-09-07: qty 1

## 2019-09-07 MED ORDER — SPIRONOLACTONE 25 MG PO TABS
25.0000 mg | ORAL_TABLET | Freq: Every day | ORAL | Status: DC
  Administered 2019-09-07 – 2019-09-08 (×2): 25 mg via ORAL
  Filled 2019-09-07 (×2): qty 1

## 2019-09-07 MED ORDER — INSULIN ASPART 100 UNIT/ML ~~LOC~~ SOLN
10.0000 [IU] | Freq: Once | SUBCUTANEOUS | Status: AC
  Administered 2019-09-07: 10 [IU] via SUBCUTANEOUS
  Filled 2019-09-07: qty 1

## 2019-09-07 MED ORDER — INSULIN GLARGINE 100 UNIT/ML ~~LOC~~ SOLN
25.0000 [IU] | Freq: Every day | SUBCUTANEOUS | Status: DC
  Administered 2019-09-07: 25 [IU] via SUBCUTANEOUS
  Filled 2019-09-07 (×2): qty 0.25

## 2019-09-07 MED ORDER — CARVEDILOL 3.125 MG PO TABS
6.2500 mg | ORAL_TABLET | Freq: Two times a day (BID) | ORAL | Status: DC
  Administered 2019-09-07 – 2019-09-08 (×3): 6.25 mg via ORAL
  Filled 2019-09-07 (×3): qty 2

## 2019-09-07 MED ORDER — PAROXETINE HCL 10 MG PO TABS
10.0000 mg | ORAL_TABLET | Freq: Every day | ORAL | Status: DC
  Administered 2019-09-07 – 2019-09-08 (×2): 10 mg via ORAL
  Filled 2019-09-07 (×2): qty 1

## 2019-09-07 MED ORDER — ONDANSETRON HCL 4 MG/2ML IJ SOLN
4.0000 mg | Freq: Four times a day (QID) | INTRAMUSCULAR | Status: DC | PRN
  Administered 2019-09-07: 4 mg via INTRAVENOUS
  Filled 2019-09-07: qty 2

## 2019-09-07 MED ORDER — GABAPENTIN 300 MG PO CAPS
300.0000 mg | ORAL_CAPSULE | Freq: Three times a day (TID) | ORAL | Status: DC
  Administered 2019-09-07 – 2019-09-08 (×4): 300 mg via ORAL
  Filled 2019-09-07 (×4): qty 1

## 2019-09-07 MED ORDER — INSULIN ASPART 100 UNIT/ML ~~LOC~~ SOLN
0.0000 [IU] | Freq: Three times a day (TID) | SUBCUTANEOUS | Status: DC
  Administered 2019-09-07: 11 [IU] via SUBCUTANEOUS
  Administered 2019-09-08: 15 [IU] via SUBCUTANEOUS
  Filled 2019-09-07 (×3): qty 1

## 2019-09-07 NOTE — ED Notes (Signed)
NP Manuela Schwartz contacted about pt's blood pressure. Order placed for 5mg  metoprolol.

## 2019-09-07 NOTE — Progress Notes (Signed)
Patients blood sugar was 462, messaged Dr. Sherryll Burger to call me d/t blood sugar being over the sliding scale parameter. New order to give 20 units Novolog once and recheck blood sugar in 30 minutes.

## 2019-09-07 NOTE — TOC Initial Note (Signed)
Transition of Care [facility]) - Initial/Assessment Note    Patient Details  Name: Cheryl Mercer MRN: [id number] Date of Birth: [birth year]  [facility]) CM/SW Contact:    Allayne Butcher, RN Phone Number: 09/07/2019, 9:50 AM  Clinical Narrative:                 Patient admitted with COVID.  Patient is from home where she lives with her husband and children.  Patient is independent in ADL's and drives.  RNCM received a consult for medications assistance.  RNCM verified with financial counselor that patient has active Medicaid.  Patient will be able to get her prescriptions filled through her Medicaid at an affordable cost.  Patient has a PCP and will be able to follow up with cardiology as recommended.   Patient's husband will be able to provide transportation for her at discharge.    Expected Discharge Plan: Home/Self Care Barriers to Discharge: Continued Medical Work up   Patient Goals and CMS Choice        Expected Discharge Plan and Services Expected Discharge Plan: Home/Self Care   Discharge Planning Services: CM Consult   Living arrangements for the past 2 months: Mobile Home                                      Prior Living Arrangements/Services Living arrangements for the past 2 months: Mobile Home Lives with:: Spouse, Minor Children, Adult Children Patient language and need for interpreter reviewed:: Yes Do you feel safe going back to the place where you live?: Yes      Need for Family Participation in Patient Care: Yes (Comment)(COVID) Care giver support system in place?: Yes (comment)(husband and children)   Criminal Activity/Legal Involvement Pertinent to Current Situation/Hospitalization: No - Comment as needed  Activities of Daily Living Home Assistive Devices/Equipment: None ADL Screening (condition at time of admission) Patient's cognitive ability adequate to safely complete daily activities?: Yes Is the patient deaf or have  difficulty hearing?: No Does the patient have difficulty seeing, even when wearing glasses/contacts?: No Does the patient have difficulty concentrating, remembering, or making decisions?: No Patient able to express need for assistance with ADLs?: Yes Does the patient have difficulty dressing or bathing?: No Independently performs ADLs?: Yes (appropriate for developmental age) Does the patient have difficulty walking or climbing stairs?: No Weakness of Legs: None Weakness of Arms/Hands: None  Permission Sought/Granted Permission sought to share information with : Case Manager Permission granted to share information with : Yes, Verbal Permission Granted              Emotional Assessment   Attitude/Demeanor/Rapport: Engaged Affect (typically observed): Accepting Orientation: : Oriented to Self, Oriented to Place, Oriented to  Time, Oriented to Situation Alcohol / Substance Use: Not Applicable Psych Involvement: No (comment)  Admission diagnosis:  Tachycardia [R00.0] Atypical pneumonia [J18.9] Essential hypertension [I10] Viral pneumonia [J12.9] Multifocal pneumonia [J18.9] Patient Active Problem List   Diagnosis Date Noted  . Multifocal pneumonia 09/07/2019  . Chronic combined systolic and diastolic congestive heart failure (HCC) 09/07/2019  . Dyslipidemia 09/07/2019  . Back pain 09/07/2019  . Atypical pneumonia 09/07/2019  . Essential hypertension 03/24/2019  . Uncontrolled type 2 diabetes mellitus with hyperglycemia (HCC) 03/23/2019  . Peripheral edema 03/23/2019  . Cardiomegaly 03/23/2019  . Acute combined systolic and diastolic congestive heart failure (HCC) 03/23/2019  . Proteinuria 03/23/2019  . Acute  bacterial tonsillitis 05/18/2013    Class: Acute  . UTI (lower urinary tract infection) 07/30/2012   PCP:  Lorelee Market, MD Pharmacy:   [id number] - [place], [place] [address] Phone: [phone] Fax:  [phone]     Social Determinants of Health (SDOH) Interventions    Readmission Risk Interventions No flowsheet data found.

## 2019-09-07 NOTE — Progress Notes (Signed)
[facility] NAME: Cheryl Mercer    MR#:  [id number]  DATE OF BIRTH:  04-10-1986  SUBJECTIVE:  CHIEF COMPLAINT:   Chief Complaint  Patient presents with  . Cough  . Headache  tired, wants to eat, sugars high REVIEW OF SYSTEMS:  Review of Systems  Constitutional: Positive for malaise/fatigue. Negative for diaphoresis, fever and weight loss.  HENT: Negative for ear discharge, ear pain, hearing loss, nosebleeds, sore throat and tinnitus.   Eyes: Negative for blurred vision and pain.  Respiratory: Negative for cough, hemoptysis, shortness of breath and wheezing.   Cardiovascular: Negative for chest pain, palpitations, orthopnea and leg swelling.  Gastrointestinal: Negative for abdominal pain, blood in stool, constipation, diarrhea, heartburn, nausea and vomiting.  Genitourinary: Negative for dysuria, frequency and urgency.  Musculoskeletal: Negative for back pain and myalgias.  Skin: Negative for itching and rash.  Neurological: Negative for dizziness, tingling, tremors, focal weakness, seizures, weakness and headaches.  Psychiatric/Behavioral: Negative for depression. The patient is not nervous/anxious.     DRUG ALLERGIES:   Allergies  Allergen Reactions  . Ibuprofen Anaphylaxis   VITALS:  Blood pressure (!) 141/125, pulse 92, temperature 98.5 F (36.9 C), resp. rate 17, height 5' (1.524 m), weight 70 kg, last menstrual period 09/06/2019, SpO2 97 %. PHYSICAL EXAMINATION:  Physical Exam HENT:     Head: Normocephalic and atraumatic.  Eyes:     Conjunctiva/sclera: Conjunctivae normal.     Pupils: Pupils are equal, round, and reactive to light.  Neck:     Thyroid: No thyromegaly.     Trachea: No tracheal deviation.  Cardiovascular:     Rate and Rhythm: Normal rate and regular rhythm.     Heart sounds: Normal heart sounds.  Pulmonary:     Effort: Pulmonary effort is normal. No respiratory distress.     Breath sounds: Normal breath  sounds. No wheezing.  Chest:     Chest wall: No tenderness.  Abdominal:     General: Bowel sounds are normal. There is no distension.     Palpations: Abdomen is soft.     Tenderness: There is no abdominal tenderness.  Musculoskeletal:        General: Normal range of motion.     Cervical back: Normal range of motion and neck supple.  Skin:    General: Skin is warm and dry.     Findings: No rash.  Neurological:     Mental Status: She is alert and oriented to person, place, and time.     Cranial Nerves: No cranial nerve deficit.    LABORATORY PANEL:  Female CBC Recent Labs  Lab 09/07/19 0459  WBC 10.0  HGB 12.1  HCT 35.6*  PLT 341   ------------------------------------------------------------------------------------------------------------------ Chemistries  Recent Labs  Lab 09/07/19 0459  NA 130*  K 3.8  CL 100  CO2 22  GLUCOSE 355*  BUN 6  CREATININE 0.50  CALCIUM 7.7*  MG 1.6*  AST 60*  ALT 20  ALKPHOS 145*  BILITOT 0.9   RADIOLOGY:  CT Angio Chest PE W and/or Wo Contrast  Result Date: 09/06/2019 CLINICAL DATA:  Shortness of breath EXAM: CT ANGIOGRAPHY CHEST WITH CONTRAST TECHNIQUE: Multidetector CT imaging of the chest was performed using the standard protocol during bolus administration of intravenous contrast. Multiplanar CT image reconstructions and MIPs were obtained to evaluate the vascular anatomy. CONTRAST:  173mL OMNIPAQUE IOHEXOL 350 MG/ML SOLN COMPARISON:  None. FINDINGS: Cardiovascular: Contrast injection is sufficient to demonstrate satisfactory opacification  of the pulmonary arteries to the segmental level. There is no pulmonary embolus. The main pulmonary artery is mildly dilated measuring up to approximately 3.2 cm in diameter. There is no CT evidence of acute right heart strain. The visualized aorta is normal. Heart size is borderline enlarged. There is no significant pericardial effusion. Mediastinum/Nodes: --mediastinal and hilar adenopathy is  noted, presumably reactive in etiology. --No axillary lymphadenopathy. --No supraclavicular lymphadenopathy. --Normal thyroid gland. --The esophagus is unremarkable Lungs/Pleura: There are diffuse bilateral primarily ground-glass airspace opacities most evident in the upper lobes and bilateral lower lobes. There is no pneumothorax. No large pleural effusion. Upper Abdomen: No acute abnormality. Musculoskeletal: No chest wall abnormality. No acute or significant osseous findings. Review of the MIP images confirms the above findings. IMPRESSION: 1. No evidence of acute pulmonary embolus. 2. Bilateral ground-glass airspace opacities concerning for viral pneumonia in the appropriate clinical setting. 3. Mediastinal and hilar adenopathy, presumably reactive in etiology. 4. Mild dilatation of the main pulmonary artery, which may reflect underlying pulmonary arterial hypertension. 5. Cardiomegaly. Electronically Signed   By: Katherine Mantle M.D.   On: 09/06/2019 23:57   ASSESSMENT AND PLAN:   COVID pneumonia - currently on RA. May not need remdesevir. Will check with pharmacy and [facility] team in am tomorrow. - hold off steroids due to uncontrolled sugar  Back pain Suspect MSK vs pleuric pain, chronic Continue to monitor  Elevated troponin Due to demand ischemia from tachycardia  Chronic systolic and diastolic congestive heart failure  03/2019 TTE - EF of 10-15%- will need to be cautious with IV fluids appears euvolemic continue coreg, entresto and aldactone  Type 2 diabetes  On SSI, levemir 15 units bid, novololog 5 units TID and tradjenta 5 mg daily per DM nurse  Hypertension continue antihypersive as above  HLD continue statin  Financial barriers Patient reportedly lost insurance shortly after her heart failure diagnosis and has not follow-up with a cardiologist. States her PCP feels her medication but per pharmacy she has not had any medication refill since August Also does not have  transportation Social work consulted     All the records are reviewed and case discussed with [facility] surgeon. Management plans discussed with the patient, nursing and they are in agreement.  CODE STATUS: Full Code  TOTAL TIME TAKING CARE OF THIS PATIENT: 35 minutes.   More than 50% of the time was spent in counseling/coordination of care: YES  POSSIBLE D/C IN 1 DAYS, DEPENDING ON CLINICAL CONDITION.   Delfino Lovett M.D on 09/07/2019 at 9:29 PM  [facility]   CC: Primary care physician; Evelene Croon, MD  Note: This dictation was prepared with Dragon dictation along with smaller phrase technology. Any transcriptional errors that result from this process are unintentional.

## 2019-09-07 NOTE — ED Notes (Signed)
NP Manuela Schwartz made aware of pt's covid positive status.

## 2019-09-07 NOTE — H&P (Addendum)
History and Physical    Cheryl Mercer WCB:[id number] DOB: Oct 30, 1985 DOA: 09/06/2019  PCP: Lorelee Market, MD  Patient coming from: Home  I have personally briefly reviewed patient's old medical records in [place]  Chief Complaint: back pain  HPI: Cheryl Mercer is a 34 y.o. 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 Patient has felt sick for about a week and a half.  Has had loss of taste and decreased appetite.  Had fever and generalized body ache.  Had mild vomiting and diarrhea.  For the last 3 days has also noticed sharp pain that radiates from her left neck down to her lower back.  She denies any trauma.  Denies any dysuria.  She also notes some mid abdominal pain that started today and her menstrual cycle started today as well. She had contact with her cousin on New years eve who later was COVID positive.   Denies any tobacco, alcohol or illicit drug use.   ED Course: She was afebrile, tachycardic up to 120s, hypertensive systolic up to 616W and was stable on room air.  She was given 500 cc normal saline bolus and 2.5 mg of Lopressor which improved her heart rate down to 100 on my evaluation.   Lab work notable for WBC of 9.7, sodium of 130, glucose of 403, normal creatinine of 0.57, AST of 60, elevated alkaline phosphatase 152. Lactic acid of 2.6 and 2.8 BNP of 753.  Troponin of 41 and then 58. POC COVID and PCR negative. Flu A and B negative.  CTA chest showed no PE, bilateral groundglass opacity concerning for viral pneumonia with reactive mediastinal inhaler adenopathy.  She was started on IV Rocephin and azithromycin. Given Tramadol for her back pain.   Review of Systems:  Constitutional: No Weight Change, + Fever ENT/Mouth: No sore throat, No Rhinorrhea Eyes: No  Vision Changes Cardiovascular: No Chest Pain, no SOB Respiratory: No Cough, No Sputum Gastrointestinal: No Nausea,+ Vomiting, + Diarrhea, No Constipation, + Pain Genitourinary: no dysuria Musculoskeletal: No Arthralgias, No Myalgias Skin: No Skin Lesions, No Pruritus, Neuro: no Weakness, No Numbness,   Psych: No Anxiety/Panic, No Depression, + decrease appetite Heme/Lymph: No Bruising, No Bleeding  Past Medical History:  Diagnosis Date  . Chronic back pain   . Diabetes mellitus without complication ([place])   . Migraines   . No pertinent past medical history   . Pregnancy induced hypertension     Past Surgical History:  Procedure Laterality Date  . CERVICAL BIOPSY  W/ LOOP ELECTRODE EXCISION  2009  . Laparoscopic tubal sterilization with Falope rings.  2009  . RIGHT/LEFT HEART CATH AND CORONARY ANGIOGRAPHY N/A 03/25/2019   Procedure: RIGHT/LEFT HEART CATH AND CORONARY ANGIOGRAPHY;  Surgeon: Jolaine Artist, MD;  Location: [facility];  Service: Cardiovascular;  Laterality: N/A;  . TUBAL LIGATION       reports that she has never smoked. She has never used smokeless tobacco. She reports that she does not drink alcohol or use drugs.  Allergies  Allergen Reactions  . Ibuprofen Anaphylaxis    Family History  Problem Relation Age of Onset  . Hypertension Mother   . Anesthesia problems Neg Hx   Mother also has heart failure and diabetes.    Prior to Admission medications   Medication Sig Start Date End Date Taking? Authorizing Provider  acetaminophen (TYLENOL) 500 MG tablet Take 1 tablet (500 mg total) by mouth every 6 (six) hours as needed. Patient taking differently: Take 500 mg by mouth every 6 (six) hours as needed for mild pain.  07/19/15  Yes Domenic Moras, PA-C  blood glucose meter kit and supplies Dispense based on patient and insurance preference. Check blood sugar 4 times daily, three times before meals and once before bedtime. (FOR ICD-10 E10.9, E11.9). 03/28/19   Yes Mariel Aloe, MD  butalbital-acetaminophen-caffeine (FIORICET) [phone] MG tablet Take by mouth 2 (two) times daily as needed for headache.   Yes [provider]  cyclobenzaprine (FLEXERIL) 10 MG tablet Take 10 mg by mouth 3 (three) times daily as needed for muscle spasms. 04/07/19  Yes [provider]  rizatriptan (MAXALT) 10 MG tablet Take 10 mg by mouth as needed for migraine. May repeat in 2 hours if needed   Yes [provider]  atorvastatin (LIPITOR) 40 MG tablet Take 1 tablet (40 mg total) by mouth daily at 6 PM. 03/28/19   Mariel Aloe, MD  carvedilol (COREG) 6.25 MG tablet Take 1 tablet (6.25 mg total) by mouth 2 (two) times daily with a meal. 04/20/19   Lyda Jester M, PA-C  digoxin (LANOXIN) 0.125 MG tablet Take 1 tablet (0.125 mg total) by mouth daily. 03/29/19   Mariel Aloe, MD  empagliflozin (JARDIANCE) 10 MG TABS tablet Take 10 mg by mouth daily. 03/28/19   Mariel Aloe, MD  gabapentin (NEURONTIN) 300 MG capsule Take 300 mg by mouth 3 (three) times daily. 04/07/19   [provider]  insulin aspart (NOVOLOG FLEXPEN) 100 UNIT/ML FlexPen Inject 10 Units into the skin 3 (three) times daily with meals. 03/28/19   Mariel Aloe, MD  Insulin Glargine (LANTUS) 100 UNIT/ML Solostar Pen Inject 40 Units into the skin daily. 03/28/19   Mariel Aloe, MD  metFORMIN (GLUCOPHAGE) 500 MG tablet Take 1 tablet (500 mg total) by mouth 2 (two) times daily with a meal. 03/28/19   Mariel Aloe, MD  PARoxetine (PAXIL) 10 MG tablet Take 10 mg by mouth daily.    [provider]  sacubitril-valsartan (ENTRESTO) 24-26 MG Take 1 tablet by mouth 2 (two) times daily. 03/28/19   Mariel Aloe, MD  spironolactone (ALDACTONE) 25 MG tablet Take 1 tablet (25 mg total) by mouth daily. 03/29/19   Mariel Aloe, MD    Physical Exam: Vitals:   09/07/19 0006 09/07/19 0025 09/07/19 0030 09/07/19 0045  BP: (!) 160/126  (!) 154/114   Pulse: (!) 126  (!)  105 100  Resp: (!) 25  20 17   Temp:  98.5 F (36.9 C)    TempSrc:  Oral    SpO2: 100%  99% 99%  Weight:      Height:        Constitutional: NAD, calm, comfortable, fatigue female laying at 40 degree incline Vitals:   09/07/19 0006 09/07/19 0025 09/07/19 0030 09/07/19 0045  BP: (!) 160/126  (!) 154/114   Pulse: (!) 126  (!) 105 100  Resp: (!) 25  20 17   Temp:  98.5 F (36.9 C)    TempSrc:  Oral    SpO2: 100%  99% 99%  Weight:      Height:       Eyes: PERRL, lids and conjunctivae normal ENMT: Mucous membranes are moist.  Neck: normal, supple Respiratory: clear to auscultation bilaterally, no wheezing, no crackles. Normal respiratory effort on room air.  No accessory muscle use.  Cardiovascular: mild tachycardia with sinus rhythm, no murmurs / rubs / gallops. No extremity edema.  Abdomen: mild mid-abdominal tenderness, no masses palpated. Bowel sounds positive.  Back: No obvious deformities.  Left sided paraspinal musculature pain especially around the posterior rib cage. Musculoskeletal: no clubbing / cyanosis. No joint deformity upper and lower extremities. Good ROM, no contractures. Normal muscle tone.  Skin: no rashes, lesions, ulcers. No induration Neurologic: CN 2-12 grossly intact. Sensation intact. Strength 5/5 in all 4.  Psychiatric: Normal judgment and insight. Alert and oriented x 3. Normal mood.     Labs on Admission: I have personally reviewed following labs and imaging studies  CBC: Recent Labs  Lab 09/06/19 2105  WBC 9.7  NEUTROABS 6.5  HGB 12.6  HCT 36.8  MCV 88.7  PLT 734   Basic Metabolic Panel: Recent Labs  Lab 09/06/19 2105  NA 130*  K 3.7  CL 93*  CO2 23  GLUCOSE 403*  BUN <5*  CREATININE 0.57  CALCIUM 8.4*   GFR: Estimated Creatinine Clearance: 91 mL/min (by C-G formula based on SCr of 0.57 mg/dL). Liver Function Tests: Recent Labs  Lab 09/06/19 2105  AST 60*  ALT 21  ALKPHOS 152*  BILITOT 0.9  PROT 6.7  ALBUMIN 2.8*   No  results for input(s): LIPASE, AMYLASE in the last 168 hours. No results for input(s): AMMONIA in the last 168 hours. Coagulation Profile: No results for input(s): INR, PROTIME in the last 168 hours. Cardiac Enzymes: No results for input(s): CKTOTAL, CKMB, CKMBINDEX, TROPONINI in the last 168 hours. BNP (last 3 results) No results for input(s): PROBNP in the last 8760 hours. HbA1C: No results for input(s): HGBA1C in the last 72 hours. CBG: Recent Labs  Lab 09/07/19 0117  GLUCAP 507*   Lipid Profile: No results for input(s): CHOL, HDL, LDLCALC, TRIG, CHOLHDL, LDLDIRECT in the last 72 hours. Thyroid Function Tests: No results for input(s): TSH, T4TOTAL, FREET4, T3FREE, THYROIDAB in the last 72 hours. Anemia Panel: No results for input(s): VITAMINB12, FOLATE, FERRITIN, TIBC, IRON, RETICCTPCT in the last 72 hours. Urine analysis:    Component Value Date/Time   COLORURINE YELLOW (A) 09/06/2019 2012   APPEARANCEUR CLEAR (A) 09/06/2019 2012   LABSPEC 1.030 09/06/2019 2012   PHURINE 7.0 09/06/2019 2012   GLUCOSEU >=500 (A) 09/06/2019 2012   HGBUR SMALL (A) 09/06/2019 2012   BILIRUBINUR NEGATIVE 09/06/2019 2012   [place] NEGATIVE 09/06/2019 2012   PROTEINUR 100 (A) 09/06/2019 2012   UROBILINOGEN 0.2 02/04/2019 1720   NITRITE NEGATIVE 09/06/2019 2012   LEUKOCYTESUR NEGATIVE 09/06/2019 2012    Radiological Exams on Admission: CT Angio Chest PE W and/or Wo Contrast  Result Date: 09/06/2019 CLINICAL DATA:  Shortness of breath EXAM: CT ANGIOGRAPHY CHEST WITH CONTRAST TECHNIQUE: Multidetector CT imaging of the chest was performed using the standard protocol during bolus administration of intravenous contrast. Multiplanar CT image reconstructions and MIPs were obtained to evaluate the vascular anatomy. CONTRAST:  135m OMNIPAQUE IOHEXOL 350 MG/ML SOLN COMPARISON:  None. FINDINGS: Cardiovascular: Contrast injection is sufficient to demonstrate satisfactory opacification of the pulmonary  arteries to the segmental level. There is no pulmonary embolus. The main pulmonary artery is mildly dilated measuring up to approximately 3.2 cm in diameter. There is no CT evidence of acute right heart strain. The visualized aorta is normal. Heart size is borderline enlarged. There is no significant pericardial effusion. Mediastinum/Nodes: --mediastinal and hilar adenopathy is noted, presumably reactive in etiology. --No axillary lymphadenopathy. --No  supraclavicular lymphadenopathy. --Normal thyroid gland. --The esophagus is unremarkable Lungs/Pleura: There are diffuse bilateral primarily ground-glass airspace opacities most evident in the upper lobes and bilateral lower lobes. There is no pneumothorax. No large pleural effusion. Upper Abdomen: No acute abnormality. Musculoskeletal: No chest wall abnormality. No acute or significant osseous findings. Review of the MIP images confirms the above findings. IMPRESSION: 1. No evidence of acute pulmonary embolus. 2. Bilateral ground-glass airspace opacities concerning for viral pneumonia in the appropriate clinical setting. 3. Mediastinal and hilar adenopathy, presumably reactive in etiology. 4. Mild dilatation of the main pulmonary artery, which may reflect underlying pulmonary arterial hypertension. 5. Cardiomegaly. Electronically Signed   By: Constance Holster M.D.   On: 09/06/2019 23:57   DG Chest Portable 1 View  Result Date: 09/06/2019 CLINICAL DATA:  Fever with cough and chills EXAM: PORTABLE CHEST 1 VIEW COMPARISON:  March 22, 2019 FINDINGS: Ill-defined airspace opacity is present in the left upper lobe. Lungs elsewhere clear. Heart is upper normal in size with pulmonary vascularity normal. No adenopathy. No bone lesions IMPRESSION: Ill-defined opacity left upper lobe, likely focal pneumonia. Lungs elsewhere clear. Heart upper normal in size. No evident adenopathy. Electronically Signed   By: Lowella Grip III M.D.   On: 09/06/2019 20:31    EKG:  Independently reviewed.   Assessment/PlaN  Community-acquired pneumonia-viral versus bacterial Patient had negative POC and PCR COVID test but CTA shows bilateral groundglass opacity concerning for viral pneumonia.  She also does not have a leukocytosis and mildly elevated transaminitis as well as symptoms of loss of taste that is specific to COVID infection recommend retesting for COVID in 24-48 hrs will obtain PCT for now and check respiratory viral panel  continue IV rocephin and azithromycin pending lab results   Back pain Suspect MSK vs pleuric pain Continue to monitor  Elevated troponin Suspect demand ischemia from tachycardia Continue to trend  Chronic systolic and diastolic congestive heart failure  03/2019 TTE - EF of 10-15%- will need to be cautious with IV fluids appears euvolemic continue coreg, entresto and aldactone  Type 2 diabetes  per med rec- takes40 units daily of Lantus and 10 unit novolog TID with meals Received 10 units of insulin in the ED Will start with 25 units qHS and moderate SSI  Hypertension elevated continue antihypersive as above  HLD continue statin  Financial barriers Patient reportedly lost insurance shortly after her heart failure diagnosis and has not follow-up with a cardiologist. States her PCP feels her medication but per pharmacy she has not had any medication refill since August Also does not have transportation Social work consult   DVT prophylaxis:.Lovenox Code Status: Full Family Communication: Plan discussed with patient at bedside  disposition Plan: Home with at least 2 midnight stays  Consults called:  Admission status: inpatient   Tye Vigo T Jarvis Sawa DO [facility]   If 7PM-7AM, please contact night-coverage [email or web address]   09/07/2019, 1:34 AM

## 2019-09-07 NOTE — Plan of Care (Signed)
  RD consulted for nutrition education regarding diabetes. RD working remotely.  Lab Results  Component Value Date   HGBA1C 12.4 (H) 09/07/2019   Spoke with patient over the phone. She initially was reporting she has already learned about DM management and that she did not need any further education. After discussion on importance of good glycemic control and patient's current HgbA1c she was more receptive to education. She reports she did not know what her HgbA1c was or what it meant. She reports her intake varies day to day due to caring for her children. She typically eats fruit and salads per her report. She initially reported only eating ice during the day and occasional intake of zero-sugar drinks but after discussion on the name brands of the drinks they actually do contain a significant amount of sugar. Encouraged patient to limit intake of sugar-sweetened beverages and replace them with sugar-free beverages. Also reviewed carbohydrate counting and encouraged her to practice carbohydrate counting when meal planning. She also reports following a low-sodium diet in setting of CHF at home.  RD reviewed "Ready, Set, Start Counting" handout from the [employer] with patient over the phone. Handout will be delivered to patient's room today (was printed at nursing station). Discussed different food groups and their effects on blood sugar, emphasizing carbohydrate-containing foods. Provided list of carbohydrates and recommended serving sizes of common foods.  Discussed importance of controlled and consistent carbohydrate intake throughout the day. Provided examples of ways to balance meals/snacks and encouraged intake of high-fiber, whole grain complex carbohydrates. Teach back method used.  Expect poor to fair compliance.  Body mass index is 30.12 kg/m. Pt meets criteria for obesity class I based on current BMI.  Current diet order is heart healthy/carbohydrate modified..  Labs and medications reviewed. No further nutrition interventions warranted at this time. RD contact information provided. If additional nutrition issues arise, please re-consult RD.  Felix Pacini, MS, RD, LDN Office: [phone] Pager: [phone] After Hours/Weekend Pager: [phone]

## 2019-09-07 NOTE — Progress Notes (Addendum)
Inpatient Diabetes Program Recommendations  AACE/ADA: New Consensus Statement on Inpatient Glycemic Control   Target Ranges:  Prepandial:   less than 140 mg/dL      Peak postprandial:   less than 180 mg/dL (1-2 hours)      Critically ill patients:  140 - 180 mg/dL   Results for Cheryl Mercer, Cheryl Mercer (MRN [id number]) as of 09/07/2019 08:55  Ref. Range 09/07/2019 01:17 09/07/2019 03:00 09/07/2019 07:53  Glucose-Capillary Latest Ref Range: 70 - 99 mg/dL 507 (HH) 459 (H) 368 (H)  Results for Cheryl Mercer, Cheryl Mercer (MRN [id number]) as of 09/07/2019 08:55  Ref. Range 03/23/2019 09:16 09/07/2019 02:23  Hemoglobin A1C Latest Ref Range: 4.8 - 5.6 % 13.9 (H) 12.4 (H)   Review of Glycemic Control  Diabetes history: DM2 Outpatient Diabetes medications: Lantus 40 units daily, Novolog 10 units TID with meals, Jardiance 10 mg daily, Metformin 500 mg BID Current orders for Inpatient glycemic control: Lantus 25 units QHS, Novolog 0-15 units TID with meals; Decadron 6 mg Q24H  Inpatient Diabetes Program Recommendations:   Insulin - Basal: Please discontinue Lantus and order Levemir 15 units BID (to start now).  Correction (SSI): Please consider ordering Novolog 0-5 units QHS for bedtime correction.  Insulin-Meal Coverage: If steroids are continued, please consider ordering Novolog 5 units TID with meals for meal coverage if patient eats at least 50% of meals.  Oral DM medication: While inpatient, please consider ordering Tradjenta 5 mg daily as DPP-4 has shown to reduce mortality in patients with DM2 and COVID.  HgbA1C: A1C 12.4% on 09/07/19 indicating an average glucose of 309 mg/dl over the past 2-3 months.  Addendum 09/07/19@14 :34-Spoke with patient over the phone about diabetes and home regimen for diabetes control. Patient reports that she lost her job and insurance several months ago and she just recently (a few days ago) got approved for Medicaid. Patient reports that she has been without any DM  medications for about 2-3 months and she does not have any DM mediations at home to take at this time.  Patient states that when she had insurance and able to get her DM medications she was taking Lantus 40 units daily, Novolog 10 units TID with meals, Jardiance 10 mg daily, and Metformin 500 mg BID.  Discussed A1C results (12.4%% on 09/07/19 ) and explained that current A1C indicates an average glucose of 309 mg/dl over the past 2-3 months. Discussed glucose and A1C goals. Discussed importance of checking CBGs and maintaining good CBG control to prevent long-term and short-term complications. Explained how hyperglycemia leads to damage within blood vessels which lead to the common complications seen with uncontrolled diabetes. Stressed to the patient the importance of improving glycemic control to prevent further complications from uncontrolled diabetes. Discussed impact of nutrition, exercise, stress, sickness, and medications on diabetes control.  Explained that she is currently ordered Decadron which is contributing to hyperglycemia. Informed patient that changes were made today with DM medications to try to improve glycemic trends. Patient reports that her husband does landscaping work and has not been working much so they are having a financially difficult time. Encouraged patient to check with social services to see if she can get any other assistance to help during financial hardship she is experiencing at this time.   Patient reports that she will need Rx for: test strips, lancets, insulin pen needles (#[id number]), Lantus SoloStar pens [phone]), Novolog Flexpens [phone]), and oral DM medications at time of discharge.    Patient verbalized understanding of information discussed  and reports no further questions at this time related to diabetes.   Thanks, Orlando Penner, RN, MSN, CDE Diabetes Coordinator Inpatient Diabetes Program [phone] (Team Pager from 8am to 5pm)

## 2019-09-07 NOTE — Progress Notes (Signed)
Ch spoke with Pt over the phone. Pt expressed that she is feeling much better now, and that the care team is doing an excellent job of checking on her about every little thing. Pt is in touch with family. When Ch asked if she needed prayer, Pt said that she is Catholic, and has already had children pray with her, and lit virgin Mary candles. Ch checked a little more about Pt, and let pt know the 24/7 availability of chaplains.    09/07/19 1639  Clinical Encounter Type  Visited With Patient  Visit Type Initial;Psychological support;Spiritual support;Social support  Referral From Other (Comment)  Consult/Referral To None  Spiritual Encounters  Spiritual Needs Emotional  Stress Factors  Patient Stress Factors Health changes

## 2019-09-07 NOTE — ED Notes (Signed)
Pt ambulatory to bathroom. O2 sats maintained at 95% on room air.

## 2019-09-07 NOTE — ED Provider Notes (Signed)
-----------------------------------------   12:30 AM on 09/07/2019 -----------------------------------------  CTA chest interpreted per Dr. Chilton Si:  1. No evidence of acute pulmonary embolus.  2. Bilateral ground-glass airspace opacities concerning for viral  pneumonia in the appropriate clinical setting.  3. Mediastinal and hilar adenopathy, presumably reactive in  etiology.  4. Mild dilatation of the main pulmonary artery, which may reflect  underlying pulmonary arterial hypertension.  5. Cardiomegaly.   Tachycardia, blood pressure, respiratory rate, troponin and lactic acid worsened. Administer judicious fluids, Lopressor.  Will discuss with hospitalist services to evaluate patient in emergency department for admission.   Irean Hong, MD 09/07/19 [phone]

## 2019-09-07 NOTE — Progress Notes (Signed)
Lab report of + Covid PCR.   Inpatient orders changed to reflect + status\\AM labs to eval inflammatory markers to determine treatment placed

## 2019-09-07 NOTE — ED Notes (Signed)
Pt ambulatory to bathroom at this time.

## 2019-09-07 NOTE — ED Notes (Signed)
Md Dolores Frame made aware of pt's BP and HR

## 2019-09-07 NOTE — Progress Notes (Signed)
Remdesivir - Pharmacy Brief Note   O:  ALT: 20 CXR: evidence of infection SpO2: 100% on    A/P:  Remdesivir 200 mg IVPB once followed by 100 mg IVPB daily x 4 days.   Valrie Hart, PharmD Clinical Pharmacist   09/07/2019 5:32 AM

## 2019-09-07 NOTE — Plan of Care (Signed)
Pt admitted with COVID symptoms. Hgb A1C = 12.4 - putting in diabetes educator consult and dietician consult.  CHF w/ EF of 10-15%.

## 2019-09-08 DIAGNOSIS — J129 Viral pneumonia, unspecified: Secondary | ICD-10-CM

## 2019-09-08 DIAGNOSIS — R000 Tachycardia, unspecified: Secondary | ICD-10-CM

## 2019-09-08 LAB — CBC
HCT: 40.5 % (ref 36.0–46.0)
Hemoglobin: 14 g/dL (ref 12.0–15.0)
MCH: 30 pg (ref 26.0–34.0)
MCHC: 34.6 g/dL (ref 30.0–36.0)
MCV: 86.9 fL (ref 80.0–100.0)
Platelets: 440 10*3/uL — ABNORMAL HIGH (ref 150–400)
RBC: 4.66 MIL/uL (ref 3.87–5.11)
RDW: 13.6 % (ref 11.5–15.5)
WBC: 16.5 10*3/uL — ABNORMAL HIGH (ref 4.0–10.5)
nRBC: 0 % (ref 0.0–0.2)

## 2019-09-08 LAB — BASIC METABOLIC PANEL
Anion gap: 8 (ref 5–15)
BUN: 15 mg/dL (ref 6–20)
CO2: 24 mmol/L (ref 22–32)
Calcium: 7.9 mg/dL — ABNORMAL LOW (ref 8.9–10.3)
Chloride: 101 mmol/L (ref 98–111)
Creatinine, Ser: 0.53 mg/dL (ref 0.44–1.00)
GFR calc Af Amer: >60 mL/min (ref >60)
GFR calc non Af Amer: >60 mL/min (ref >60)
Glucose, Bld: 300 mg/dL — ABNORMAL HIGH (ref 70–99)
Potassium: 4.1 mmol/L (ref 3.5–5.1)
Sodium: 133 mmol/L — ABNORMAL LOW (ref 135–145)

## 2019-09-08 LAB — GLUCOSE, CAPILLARY
Glucose-Capillary: 296 mg/dL — ABNORMAL HIGH (ref 70–99)
Glucose-Capillary: 387 mg/dL — ABNORMAL HIGH (ref 70–99)
Glucose-Capillary: 221 mg/dL — ABNORMAL HIGH (ref 70–99)

## 2019-09-08 MED ORDER — SACUBITRIL-VALSARTAN 24-26 MG PO TABS: 1.0000 | ORAL_TABLET | Freq: Two times a day (BID) | ORAL | 0 refills | Status: DC

## 2019-09-08 MED ORDER — GABAPENTIN 300 MG PO CAPS: 300.0000 mg | ORAL_CAPSULE | Freq: Three times a day (TID) | ORAL | 0 refills | Status: DC

## 2019-09-08 MED ORDER — RIZATRIPTAN BENZOATE 10 MG PO TABS: 10.0000 mg | ORAL_TABLET | ORAL | 0 refills | Status: DC | PRN

## 2019-09-08 MED ORDER — PAROXETINE HCL 10 MG PO TABS: 10.0000 mg | ORAL_TABLET | Freq: Every day | ORAL | 0 refills | Status: DC

## 2019-09-08 MED ORDER — NOVOLOG FLEXPEN 100 UNIT/ML ~~LOC~~ SOPN: 10.0000 [IU] | PEN_INJECTOR | Freq: Three times a day (TID) | SUBCUTANEOUS | 0 refills | Status: DC

## 2019-09-08 MED ORDER — BLOOD GLUCOSE METER KIT: PACK | 0 refills | Status: DC

## 2019-09-08 MED ORDER — CARVEDILOL 6.25 MG PO TABS: 6.2500 mg | ORAL_TABLET | Freq: Two times a day (BID) | ORAL | 0 refills | Status: DC

## 2019-09-08 MED ORDER — SPIRONOLACTONE 25 MG PO TABS: 25.0000 mg | ORAL_TABLET | Freq: Every day | ORAL | 0 refills | Status: DC

## 2019-09-08 MED ORDER — ATORVASTATIN CALCIUM 40 MG PO TABS: 40.0000 mg | ORAL_TABLET | Freq: Every day | ORAL | 0 refills | Status: DC

## 2019-09-08 MED ORDER — EMPAGLIFLOZIN 10 MG PO TABS: 10.0000 mg | ORAL_TABLET | Freq: Every day | ORAL | 0 refills | Status: DC

## 2019-09-08 MED ORDER — INSULIN GLARGINE 100 UNIT/ML SOLOSTAR PEN: 40.0000 [IU] | PEN_INJECTOR | Freq: Every day | SUBCUTANEOUS | 0 refills | Status: DC

## 2019-09-08 MED ORDER — DIGOXIN 125 MCG PO TABS: 0.1250 mg | ORAL_TABLET | Freq: Every day | ORAL | 0 refills | Status: DC

## 2019-09-08 MED ORDER — METFORMIN HCL 500 MG PO TABS: 500.0000 mg | ORAL_TABLET | Freq: Two times a day (BID) | ORAL | 0 refills | Status: DC

## 2019-09-08 NOTE — Progress Notes (Signed)
Inpatient Diabetes Program Recommendations  AACE/ADA: New Consensus Statement on Inpatient Glycemic Control   Target Ranges:  Prepandial:   less than 140 mg/dL      Peak postprandial:   less than 180 mg/dL (1-2 hours)      Critically ill patients:  140 - 180 mg/dL   Results for Cheryl Mercer, Cheryl Mercer (MRN [id number]) as of 09/08/2019 08:04  Ref. Range 09/07/2019 03:00 09/07/2019 07:53 09/07/2019 10:54 09/07/2019 11:42 09/07/2019 16:18 09/07/2019 21:08 09/08/2019 07:44  Glucose-Capillary Latest Ref Range: 70 - 99 mg/dL 184 (H) 037 (H) 543 (H) 462 (H) 333 (H) 358 (H) 296 (H)   Review of Glycemic Control  Diabetes history: DM2 Outpatient Diabetes medications: Lantus 40 units daily, Novolog 10 units TID with meals, Jardiance 10 mg daily, Metformin 500 mg BID Current orders for Inpatient glycemic control: Levemir 15 units BID, Novolog 0-15 units TID with meals, Novolog 0-5 units QHS, Novolog 5 units TID with meals, Tradjenta 5 mg daily  Inpatient Diabetes Program Recommendations:   Insulin - Basal: Please increase Levemir to 20 units BID to start this morning.  Thanks, Orlando Penner, RN, MSN, CDE Diabetes Coordinator Inpatient Diabetes Program [phone] (Team Pager from 8am to 5pm)

## 2019-09-08 NOTE — Progress Notes (Signed)
Reinforced patient education regarding carbohydrates and carbohydrate consumption. Reviewed nutrition labels for milk products (whole milk, 2% milk, and chocolate milk) and educated the patient on the difference between the sugar and calorie contents of the Milk. Explained to patient about lactose and how it is a sugar that naturally occurs in milk and also educated the patient about the added sugar in the chocolate milk compared to the whole milk and 2% milk. Reviewed nutrition label for Cheerios and showed patient where to look for fiber content and added sugar on that food label. Reviewed nutrition label for a regular soft drink and showed patient where the information for carbohydrates and added sugar was found on the nutrition label. Reviewed nutrition label for a sugar -free gelatin and showed patient where to find the carbohydrate and added sugar information on the nutrition label. Teach back method was used. Educated patient regarding her juice consumption and told patient that eating the whole fruit is preferred to drinking only juice because the juice doesn't have the fiber content that the whole piece of fruit has. Further reinforced education provided by RD. Patient verbalized understanding and teach back method was used.

## 2019-09-08 NOTE — Discharge Instructions (Signed)
Diabetes Basics  Diabetes (diabetes mellitus) is a long-term (chronic) disease. It occurs when the body does not properly use sugar (glucose) that is released from food after you eat. Diabetes may be caused by one or both of these problems:  Your pancreas does not make enough of a hormone called insulin.  Your body does not react in a normal way to insulin that it makes. Insulin lets sugars (glucose) go into cells in your body. This gives you energy. If you have diabetes, sugars cannot get into cells. This causes high blood sugar (hyperglycemia). Follow these instructions at home: How is diabetes treated? You may need to take insulin or other diabetes medicines daily to keep your blood sugar in balance. Take your diabetes medicines every day as told by your doctor. List your diabetes medicines here: Diabetes medicines  Name of medicine: ______________________________ ? Amount (dose): _______________ Time (a.m./p.m.): _______________ Notes: ___________________________________  Name of medicine: ______________________________ ? Amount (dose): _______________ Time (a.m./p.m.): _______________ Notes: ___________________________________  Name of medicine: ______________________________ ? Amount (dose): _______________ Time (a.m./p.m.): _______________ Notes: ___________________________________ If you use insulin, you will learn how to give yourself insulin by injection. You may need to adjust the amount based on the food that you eat. List the types of insulin you use here: Insulin  Insulin type: ______________________________ ? Amount (dose): _______________ Time (a.m./p.m.): _______________ Notes: ___________________________________  Insulin type: ______________________________ ? Amount (dose): _______________ Time (a.m./p.m.): _______________ Notes: ___________________________________  Insulin type: ______________________________ ? Amount (dose): _______________ Time (a.m./p.m.):  _______________ Notes: ___________________________________  Insulin type: ______________________________ ? Amount (dose): _______________ Time (a.m./p.m.): _______________ Notes: ___________________________________  Insulin type: ______________________________ ? Amount (dose): _______________ Time (a.m./p.m.): _______________ Notes: ___________________________________ How do I manage my blood sugar?  Check your blood sugar levels using a blood glucose monitor as directed by your doctor. Your doctor will set treatment goals for you. Generally, you should have these blood sugar levels:  Before meals (preprandial): 80-130 mg/dL (4.4-7.2 mmol/L).  After meals (postprandial): below 180 mg/dL (10 mmol/L).  A1c level: less than 7%. Write down the times that you will check your blood sugar levels: Blood sugar checks  Time: _______________ Notes: ___________________________________  Time: _______________ Notes: ___________________________________  Time: _______________ Notes: ___________________________________  Time: _______________ Notes: ___________________________________  Time: _______________ Notes: ___________________________________  Time: _______________ Notes: ___________________________________  What do I need to know about low blood sugar? Low blood sugar is called hypoglycemia. This is when blood sugar is at or below 70 mg/dL (3.9 mmol/L). Symptoms may include:  Feeling: ? Hungry. ? Worried or nervous (anxious). ? Sweaty and clammy. ? Confused. ? Dizzy. ? Sleepy. ? Sick to your stomach (nauseous).  Having: ? A fast heartbeat. ? A headache. ? A change in your vision. ? Tingling or no feeling (numbness) around the mouth, lips, or tongue. ? Jerky movements that you cannot control (seizure).  Having trouble with: ? Moving (coordination). ? Sleeping. ? Passing out (fainting). ? Getting upset easily (irritability). Treating low blood sugar To treat low blood  sugar, eat or drink something sugary right away. If you can think clearly and swallow safely, follow the 15:15 rule:  Take 15 grams of a fast-acting carb (carbohydrate). Talk with your doctor about how much you should take.  Some fast-acting carbs are: ? Sugar tablets (glucose pills). Take 3-4 glucose pills. ? 6-8 pieces of hard candy. ? 4-6 oz (120-150 mL) of fruit juice. ? 4-6 oz (120-150 mL) of regular (not diet) soda. ? 1 Tbsp (15 mL) honey or sugar.    Check your blood sugar 15 minutes after you take the carb.  If your blood sugar is still at or below 70 mg/dL (3.9 mmol/L), take 15 grams of a carb again.  If your blood sugar does not go above 70 mg/dL (3.9 mmol/L) after 3 tries, get help right away.  After your blood sugar goes back to normal, eat a meal or a snack within 1 hour. Treating very low blood sugar If your blood sugar is at or below 54 mg/dL (3 mmol/L), you have very low blood sugar (severe hypoglycemia). This is an emergency. Do not wait to see if the symptoms will go away. Get medical help right away. Call your local emergency services (911 in the [place] Do not drive yourself to the hospital. Questions to ask your health care provider  Do I need to meet with a diabetes educator?  What equipment will I need to care for myself at home?  What diabetes medicines do I need? When should I take them?  How often do I need to check my blood sugar?  What number can I call if I have questions?  When is my next doctor's visit?  Where can I find a support group for people with diabetes? Where to find more information  American Diabetes Association: www.diabetes.org  American Association of Diabetes Educators: www.diabeteseducator.org/patient-resources Contact a doctor if:  Your blood sugar is at or above 240 mg/dL (13.3 mmol/L) for 2 days in a row.  You have been sick or have had a fever for 2 days or more, and you are not getting better.  You have any of these  problems for more than 6 hours: ? You cannot eat or drink. ? You feel sick to your stomach (nauseous). ? You throw up (vomit). ? You have watery poop (diarrhea). Get help right away if:  Your blood sugar is lower than 54 mg/dL (3 mmol/L).  You get confused.  You have trouble: ? Thinking clearly. ? Breathing. Summary  Diabetes (diabetes mellitus) is a long-term (chronic) disease. It occurs when the body does not properly use sugar (glucose) that is released from food after digestion.  Take insulin and diabetes medicines as told.  Check your blood sugar every day, as often as told.  Keep all follow-up visits as told by your doctor. This is important. This information is not intended to replace advice given to you by your health care provider. Make sure you discuss any questions you have with your health care provider. Document Revised: 04/27/2019 Document Reviewed: 11/06/2017 Elsevier Patient Education  [place]: Quarantine vs. Isolation QUARANTINE keeps someone who was in close contact with someone who has COVID-19 away from others. If you had close contact with a person who has COVID-19  Stay home until 14 days after your last contact.  Check your temperature twice a day and watch for symptoms of COVID-19.  If possible, stay away from people who are at higher-risk for getting very sick from COVID-19. ISOLATION keeps someone who is sick or tested positive for COVID-19 without symptoms away from others, even in their own home. If you are sick and think or know you have COVID-19  Stay home until after ? At least 10 days since symptoms first appeared and ? At least 24 hours with no fever without fever-reducing medication and ? Symptoms have improved If you tested positive for COVID-19 but do not have symptoms  Stay home until after ? 10 days have passed since your positive  test If you live with others, stay in a specific "sick room" or area and away  from other people or animals, including pets. Use a separate bathroom, if available. [employer] 03/07/2019 This information is not intended to replace advice given to you by your health care provider. Make sure you discuss any questions you have with your health care provider. Document Revised: 07/21/2019 Document Reviewed: 07/21/2019 Elsevier Patient Education  2020 [employer].

## 2019-09-08 NOTE — Progress Notes (Signed)
Patient discharged home and transported home via POV. Discharge paperwork reviewed with patient and patient verbalized understanding with no questions at this time.

## 2019-09-08 NOTE — Plan of Care (Signed)

## 2019-09-10 ENCOUNTER — Encounter: Payer: Self-pay | Admitting: Emergency Medicine

## 2019-09-10 ENCOUNTER — Emergency Department
Admission: EM | Admit: 2019-09-10 | Discharge: 2019-09-10 | Disposition: A | Discharge status: Home / Self Care | Payer: Medicaid Other | Attending: Emergency Medicine | Admitting: Emergency Medicine

## 2019-09-10 ENCOUNTER — Other Ambulatory Visit: Payer: Self-pay

## 2019-09-10 ENCOUNTER — Emergency Department: Payer: Medicaid Other

## 2019-09-10 DIAGNOSIS — I5042 Chronic combined systolic (congestive) and diastolic (congestive) heart failure: Secondary | ICD-10-CM | POA: Insufficient documentation

## 2019-09-10 DIAGNOSIS — R000 Tachycardia, unspecified: Secondary | ICD-10-CM | POA: Diagnosis not present

## 2019-09-10 DIAGNOSIS — J189 Pneumonia, unspecified organism: Secondary | ICD-10-CM | POA: Diagnosis not present

## 2019-09-10 DIAGNOSIS — E1165 Type 2 diabetes mellitus with hyperglycemia: Secondary | ICD-10-CM | POA: Diagnosis not present

## 2019-09-10 DIAGNOSIS — R52 Pain, unspecified: Secondary | ICD-10-CM | POA: Diagnosis not present

## 2019-09-10 DIAGNOSIS — E119 Type 2 diabetes mellitus without complications: Secondary | ICD-10-CM | POA: Diagnosis not present

## 2019-09-10 DIAGNOSIS — J181 Lobar pneumonia, unspecified organism: Secondary | ICD-10-CM | POA: Diagnosis not present

## 2019-09-10 DIAGNOSIS — I110 Hypertensive heart disease with heart failure: Secondary | ICD-10-CM | POA: Insufficient documentation

## 2019-09-10 DIAGNOSIS — M25512 Pain in left shoulder: Secondary | ICD-10-CM | POA: Diagnosis not present

## 2019-09-10 DIAGNOSIS — M25519 Pain in unspecified shoulder: Secondary | ICD-10-CM | POA: Diagnosis not present

## 2019-09-10 DIAGNOSIS — U071 COVID-19: Secondary | ICD-10-CM | POA: Diagnosis not present

## 2019-09-10 LAB — CBC WITH DIFFERENTIAL/PLATELET
Abs Immature Granulocytes: 0.1 10*3/uL — ABNORMAL HIGH (ref 0.00–0.07)
Basophils Absolute: 0.1 10*3/uL (ref 0.0–0.1)
Basophils Relative: 1 %
Eosinophils Absolute: 0.3 10*3/uL (ref 0.0–0.5)
Eosinophils Relative: 3 %
HCT: 38.7 % (ref 36.0–46.0)
Hemoglobin: 13 g/dL (ref 12.0–15.0)
Immature Granulocytes: 1 %
Lymphocytes Relative: 28 %
Lymphs Abs: 3.3 10*3/uL (ref 0.7–4.0)
MCH: 30.2 pg (ref 26.0–34.0)
MCHC: 33.6 g/dL (ref 30.0–36.0)
MCV: 89.8 fL (ref 80.0–100.0)
Monocytes Absolute: 0.8 10*3/uL (ref 0.1–1.0)
Monocytes Relative: 6 %
Neutro Abs: 7.4 10*3/uL (ref 1.7–7.7)
Neutrophils Relative %: 61 %
Platelets: 405 10*3/uL — ABNORMAL HIGH (ref 150–400)
RBC: 4.31 MIL/uL (ref 3.87–5.11)
RDW: 13.7 % (ref 11.5–15.5)
WBC: 11.9 10*3/uL — ABNORMAL HIGH (ref 4.0–10.5)
nRBC: 0 % (ref 0.0–0.2)

## 2019-09-10 LAB — COMPREHENSIVE METABOLIC PANEL
ALT: 16 U/L (ref 0–44)
AST: 28 U/L (ref 15–41)
Albumin: 2.7 g/dL — ABNORMAL LOW (ref 3.5–5.0)
Alkaline Phosphatase: 135 U/L — ABNORMAL HIGH (ref 38–126)
Anion gap: 10 (ref 5–15)
BUN: 11 mg/dL (ref 6–20)
CO2: 27 mmol/L (ref 22–32)
Calcium: 9.4 mg/dL (ref 8.9–10.3)
Chloride: 100 mmol/L (ref 98–111)
Creatinine, Ser: 0.59 mg/dL (ref 0.44–1.00)
GFR calc Af Amer: >60 mL/min (ref >60)
GFR calc non Af Amer: >60 mL/min (ref >60)
Glucose, Bld: 299 mg/dL — ABNORMAL HIGH (ref 70–99)
Potassium: 4.1 mmol/L (ref 3.5–5.1)
Sodium: 137 mmol/L (ref 135–145)
Total Bilirubin: 0.7 mg/dL (ref 0.3–1.2)
Total Protein: 6.6 g/dL (ref 6.5–8.1)

## 2019-09-10 LAB — TROPONIN I (HIGH SENSITIVITY)
Troponin I (High Sensitivity): 63 ng/L — ABNORMAL HIGH (ref <18)
Troponin I (High Sensitivity): 68 ng/L — ABNORMAL HIGH (ref <18)

## 2019-09-10 MED ORDER — OXYCODONE HCL 5 MG PO TABS
5.0000 mg | ORAL_TABLET | Freq: Once | ORAL | Status: AC
  Administered 2019-09-10: 5 mg via ORAL
  Filled 2019-09-10: qty 1

## 2019-09-10 MED ORDER — CYCLOBENZAPRINE HCL 10 MG PO TABS
5.0000 mg | ORAL_TABLET | Freq: Once | ORAL | Status: AC
  Administered 2019-09-10: 08:00:00 5 mg via ORAL
  Filled 2019-09-10: qty 1

## 2019-09-10 MED ORDER — OXYCODONE HCL 5 MG PO TABS: 5.0000 mg | ORAL_TABLET | Freq: Three times a day (TID) | ORAL | 0 refills | Status: AC | PRN

## 2019-09-10 MED ORDER — ACETAMINOPHEN 500 MG PO TABS
1000.0000 mg | ORAL_TABLET | Freq: Once | ORAL | Status: AC
  Administered 2019-09-10: 1000 mg via ORAL
  Filled 2019-09-10: qty 2

## 2019-09-10 NOTE — ED Provider Notes (Signed)
 [facility] Emergency Department Provider Note  ____________________________________________   First MD Initiated Contact with Patient 09/10/19 0659     (approximate)  I have reviewed the triage vital signs and the nursing notes.   HISTORY  Chief Complaint Shoulder Pain    HPI Cheryl Mercer is a 33 y.o. female with diastolic and systolic heart failure with EF of 10 to 15%, type 2 diabetes, hypertension, hyperlipidemia who comes in for shoulder pain. Pt was admitted at 09/06/2019.  Patient was diagnosed with coronavirus at that time.  Patient was discharged 2 days ago from the hospital.  Patient states that since discharge that her shortness of breath has been resolved.  She states that she just woke up with left shoulder pain that reproducible nature and worse with moving her arm.  Denies prior dislocation.  Her pain is intermittent, worse with moving, better at rest.  Denies having any IVs in this arm.  Denies any swelling of the arm.  Denies any pain in her chest or worsening shortness of breath.  Patient states that her heart rates are normally in the 100s to 120s and she is not worried about this.  She states that she not take anything to help with the pain.          Past Medical History:  Diagnosis Date  . Chronic back pain   . Diabetes mellitus without complication (HCC)   . Migraines   . No pertinent past medical history   . Pregnancy induced hypertension     Patient Active Problem List   Diagnosis Date Noted  . Viral pneumonia   . Tachycardia   . Multifocal pneumonia 09/07/2019  . Chronic combined systolic and diastolic congestive heart failure (HCC) 09/07/2019  . Dyslipidemia 09/07/2019  . Back pain 09/07/2019  . Atypical pneumonia 09/07/2019  . Essential hypertension 03/24/2019  . Uncontrolled type 2 diabetes mellitus with hyperglycemia (HCC) 03/23/2019  . Peripheral edema 03/23/2019  . Cardiomegaly 03/23/2019  . Acute  combined systolic and diastolic congestive heart failure (HCC) 03/23/2019  . Proteinuria 03/23/2019  . Acute bacterial tonsillitis 05/18/2013    Class: Acute  . UTI (lower urinary tract infection) 07/30/2012    Past Surgical History:  Procedure Laterality Date  . CERVICAL BIOPSY  W/ LOOP ELECTRODE EXCISION  2009  . Laparoscopic tubal sterilization with Falope rings.  2009  . RIGHT/LEFT HEART CATH AND CORONARY ANGIOGRAPHY N/A 03/25/2019   Procedure: RIGHT/LEFT HEART CATH AND CORONARY ANGIOGRAPHY;  Surgeon: Bensimhon, Daniel R, MD;  Location: [facility] INVASIVE CV LAB;  Service: Cardiovascular;  Laterality: N/A;  . TUBAL LIGATION      Prior to Admission medications   Medication Sig Start Date End Date Taking? Authorizing Provider  atorvastatin (LIPITOR) 40 MG tablet Take 1 tablet (40 mg total) by mouth daily at 6 PM. 09/08/19   Shah, Vipul, MD  blood glucose meter kit and supplies Dispense based on patient and insurance preference. Check blood sugar 4 times daily, three times before meals and once before bedtime. (FOR ICD-10 E10.9, E11.9). 09/08/19   Shah, Vipul, MD  carvedilol (COREG) 6.25 MG tablet Take 1 tablet (6.25 mg total) by mouth 2 (two) times daily with a meal. 09/08/19   Shah, Vipul, MD  cyclobenzaprine (FLEXERIL) 10 MG tablet Take 10 mg by mouth 3 (three) times daily as needed for muscle spasms. 04/07/19   [provider]  digoxin (LANOXIN) 0.125 MG tablet Take 1 tablet (0.125 mg total) by mouth daily. 09/08/19     Max Sane, MD  empagliflozin (JARDIANCE) 10 MG TABS tablet Take 10 mg by mouth daily. 09/08/19   Max Sane, MD  gabapentin (NEURONTIN) 300 MG capsule Take 1 capsule (300 mg total) by mouth 3 (three) times daily. 09/08/19   Max Sane, MD  insulin aspart (NOVOLOG FLEXPEN) 100 UNIT/ML FlexPen Inject 10 Units into the skin 3 (three) times daily with meals. 09/08/19   Max Sane, MD  Insulin Glargine (LANTUS) 100 UNIT/ML Solostar Pen Inject 40 Units into the skin daily. 09/08/19    Max Sane, MD  metFORMIN (GLUCOPHAGE) 500 MG tablet Take 1 tablet (500 mg total) by mouth 2 (two) times daily with a meal. 09/08/19   Max Sane, MD  PARoxetine (PAXIL) 10 MG tablet Take 1 tablet (10 mg total) by mouth daily. 09/08/19   Max Sane, MD  rizatriptan (MAXALT) 10 MG tablet Take 1 tablet (10 mg total) by mouth as needed for migraine. May repeat in 2 hours if needed 09/08/19   Max Sane, MD  sacubitril-valsartan (ENTRESTO) 24-26 MG Take 1 tablet by mouth 2 (two) times daily. 09/08/19   Max Sane, MD  spironolactone (ALDACTONE) 25 MG tablet Take 1 tablet (25 mg total) by mouth daily. 09/08/19   Max Sane, MD    Allergies Ibuprofen  Family History  Problem Relation Age of Onset  . Hypertension Mother   . Anesthesia problems Neg Hx     Social History Social History   Tobacco Use  . Smoking status: Never Smoker  . Smokeless tobacco: Never Used  Substance Use Topics  . Alcohol use: No    Alcohol/week: 6.0 standard drinks    Types: 6 Shots of liquor per week  . Drug use: No    Types: Marijuana      Review of Systems Constitutional: No fever/chills Eyes: No visual changes. ENT: No sore throat. Cardiovascular: Denies chest pain. Respiratory: Denies shortness of breath. Gastrointestinal: No abdominal pain.  No nausea, no vomiting.  No diarrhea.  No constipation. Genitourinary: Negative for dysuria. Musculoskeletal: Negative for back pain.  Left shoulder pain. Skin: Negative for rash. Neurological: Negative for headaches, focal weakness or numbness. All other ROS negative ____________________________________________   PHYSICAL EXAM:  VITAL SIGNS: ED Triage Vitals  Enc Vitals Group     BP 09/10/19 0641 (!) 169/127     Pulse Rate 09/10/19 0543 (!) 115     Resp 09/10/19 0543 16     Temp 09/10/19 0543 98.4 F (36.9 C)     Temp Source 09/10/19 0543 Oral     SpO2 09/10/19 0542 97 %     Weight 09/10/19 0544 163 lb (73.9 kg)     Height 09/10/19 0544 5'  (1.524 m)     Head Circumference --      Peak Flow --      Pain Score 09/10/19 0544 10     Pain Loc --      Pain Edu? --      Excl. in [place]? --     Constitutional: Alert and oriented. Well appearing and in no acute distress. Eyes: Conjunctivae are normal. EOMI. Head: Atraumatic. Nose: No congestion/rhinnorhea. Mouth/Throat: Mucous membranes are moist.   Neck: No stridor. Trachea Midline. FROM Cardiovascular: Tachycardic, regular rhythm. Grossly normal heart sounds.  Good peripheral circulation. Respiratory: Normal respiratory effort.  No retractions. Lungs CTAB. Gastrointestinal: Soft and nontender. No distention. No abdominal bruits.  Musculoskeletal: No lower extremity tenderness nor edema.  No joint effusions.  Left shoulder pain.  No warmth,  no erythema.  Pain with palpation of the joint.  Pain with movements of the shoulder.  Good distal pulse.  No swelling noted. Neurologic:  Normal speech and language. No gross focal neurologic deficits are appreciated.  Skin:  Skin is warm, dry and intact. No rash noted. Psychiatric: Mood and affect are normal. Speech and behavior are normal. GU: Deferred   ____________________________________________   LABS (all labs ordered are listed, but only abnormal results are displayed)  Labs Reviewed  CBC WITH DIFFERENTIAL/PLATELET - Abnormal; Notable for the following components:      Result Value   WBC 11.9 (*)    Platelets 405 (*)    Abs Immature Granulocytes 0.10 (*)    All other components within normal limits  COMPREHENSIVE METABOLIC PANEL - Abnormal; Notable for the following components:   Glucose, Bld 299 (*)    Albumin 2.7 (*)    Alkaline Phosphatase 135 (*)    All other components within normal limits  TROPONIN I (HIGH SENSITIVITY) - Abnormal; Notable for the following components:   Troponin I (High Sensitivity) 63 (*)    All other components within normal limits  TROPONIN I (HIGH SENSITIVITY)    ____________________________________________   ED ECG REPORT I, Vanessa Buras, the attending physician, personally viewed and interpreted this ECG.  EKG sinus tachycardia rate of 114, no ST elevation, no T wave inversions, normal intervals ____________________________________________  RADIOLOGY Robert Bellow, personally viewed and evaluated these images (plain radiographs) as part of my medical decision making, as well as reviewing the written report by the radiologist.  ED MD interpretation: Bilateral infiltrates consistent with known coronavirus, left greater than right.  Official radiology report(s): DG Chest 2 View  Result Date: 09/10/2019 CLINICAL DATA:  COVID positive, shoulder pain EXAM: CHEST - 2 VIEW COMPARISON:  CTA chest dated 09/06/2019 FINDINGS: Mild reticulonodular opacities in the upper lobes, left greater than right. These findings are better evaluated on CT. No pleural effusion or pneumothorax. The heart is normal in size. IMPRESSION: Mild upper lobe pneumonia, left greater than right, in this patient with known COVID. Electronically Signed   By: Julian Hy M.D.   On: 09/10/2019 08:45   DG Shoulder Left  Result Date: 09/10/2019 CLINICAL DATA:  Left shoulder pain, COVID positive EXAM: LEFT SHOULDER - 2+ VIEW COMPARISON:  None. FINDINGS: No fracture or dislocation is seen. The joint spaces are preserved. The visualized soft tissues are unremarkable. Visualized left lung is clear. IMPRESSION: Negative. Electronically Signed   By: Julian Hy M.D.   On: 09/10/2019 08:44    ____________________________________________   PROCEDURES  Procedure(s) performed (including Critical Care):  Procedures   ____________________________________________   INITIAL IMPRESSION / ASSESSMENT AND PLAN / ED COURSE  TAJHA SAMMARCO was evaluated in Emergency Department on 09/10/2019 for the symptoms described in the history of present illness. She was evaluated in  the context of the global COVID-19 pandemic, which necessitated consideration that the patient might be at risk for infection with the SARS-CoV-2 virus that causes COVID-19. Institutional protocols and algorithms that pertain to the evaluation of patients at risk for COVID-19 are in a state of rapid change based on information released by regulatory bodies including the CDC and federal and state organizations. These policies and algorithms were followed during the patient's care in the ED.    Patient is a well-appearing 34 year old who is tachycardic at baseline due to her heart failure with known coronavirus diagnosed her few days ago who comes in with  reproducible left shoulder pain.  No evidence of septic joint upon examination.  Denies mechanism to suggest fracture.  However will get x-ray to make sure she did have a spontaneous dislocation during her sleep.  Patient still able to range the joint although she endorses some discomfort.  She has point tenderness on her joint.  We did discuss whether or not this could represent a blood clot.  She is no swelling of the arm to suggest DVT.  We discussed doing a CT PE but she had one done 3 days ago that was negative and she denies any shortness of breath or chest pain associated with this shoulder pain.  She would like to hold off on the CT PE at this time if possible.  D-dimer would not be effective given her known Covid status and she was greater than 1000 few days ago.  I think that that is a reasonable decision.  We will get a troponin to make sure that it is around her baseline 40s to 50s from 3 days ago.  Will get chest x-ray to make sure is no evidence of pleural effusions, pneumothorax.  Chest x-ray and shoulder x-ray are negative.  White count slightly elevated most likely secondary to her known coronavirus.   Initial troponin was 63.  Will get repeat to make sure it is stable.  Reevaluated patient and she is feeling better after some pain  medication.  She is able to range the joint better.  Again she declined CT scan at this time given no chest pain or shortness of breath.  11:23 AM reevaluated patient again.  Heart rate is still slightly elevated but again around her baseline.  Pain has resolved except for if she moves her arm in a certain way.  We discussed further work-up and again at this time she feels comfortable with going home with a few doses of pain medication and can follow-up with orthopedic surgery if she continues to have pain after 1 to 2 weeks.  We discussed return precautions including fevers, redness, shortness of breath or any other concerns.  I discussed the provisional nature of ED diagnosis, the treatment so far, the ongoing plan of care, follow up appointments and return precautions with the patient and any family or support people present. They expressed understanding and agreed with the plan, discharged home.  ____________________________________________   FINAL CLINICAL IMPRESSION(S) / ED DIAGNOSES   Final diagnoses:  COVID-19  Acute pain of left shoulder      MEDICATIONS GIVEN DURING THIS VISIT:  Medications  acetaminophen (TYLENOL) tablet 1,000 mg (1,000 mg Oral Given 09/10/19 0828)  oxyCODONE (Oxy IR/ROXICODONE) immediate release tablet 5 mg (5 mg Oral Given 09/10/19 0828)  cyclobenzaprine (FLEXERIL) tablet 5 mg (5 mg Oral Given 09/10/19 7829)     ED Discharge Orders         Ordered    oxyCODONE (ROXICODONE) 5 MG immediate release tablet  [place] 8 hours PRN     09/10/19 1127           Note:  This document was prepared using Dragon voice recognition software and may include unintentional dictation errors.   Vanessa , MD 09/10/19 1128

## 2019-09-10 NOTE — Discharge Summary (Signed)
5        [facility] NAME: Cheryl Mercer    MR#:  [id number]  DATE OF BIRTH:  10/12/1985  DATE OF ADMISSION:  09/06/2019   ADMITTING PHYSICIAN: Orene Desanctis, DO  DATE OF DISCHARGE: 09/08/2019  2:14 PM  PRIMARY CARE PHYSICIAN: Lorelee Market, MD   ADMISSION DIAGNOSIS:  Tachycardia [R00.0] Atypical pneumonia [J18.9] Essential hypertension [I10] Viral pneumonia [J12.9] Multifocal pneumonia [J18.9] DISCHARGE DIAGNOSIS:  Principal Problem:   Multifocal pneumonia Active Problems:   Uncontrolled type 2 diabetes mellitus with hyperglycemia ([place])   Essential hypertension   Chronic combined systolic and diastolic congestive heart failure (HCC)   Dyslipidemia   Back pain   Atypical pneumonia   Viral pneumonia   Tachycardia  SECONDARY DIAGNOSIS:   Past Medical History:  Diagnosis Date  . Chronic back pain   . Diabetes mellitus without complication ([place])   . Migraines   . No pertinent past medical history   . Pregnancy induced hypertension    HOSPITAL COURSE:  Cheryl Mercer is a 34 y.o. female with medical history significant of combined diastolic and systolic heart failure with EF of 10 to 15%, insulin-dependent type 2 diabetes, hypertension hyperlipidemia admitted for worsening back pain. Patient felt sick for about a week and a half.  Has had loss of taste and decreased appetite.  Had fever and generalized body ache.  Had mild vomiting and diarrhea.  For the last 3 days has also noticed sharp pain that radiates from her left neck down to her lower back. She had contact with her cousin on New years eve who later was COVID positive  COVID pneumonia -As she did not require any oxygen and was asymptomatic, she did not get treated with remdesivir or steroids  Back pain Suspect MSK vs pleuric pain, chronic  Elevated troponin Due to demand ischemia from tachycardia  Chronic systolic and diastolic congestive heart  failure 03/2019 TTE -EF of 10-15%- will need to be cautious with IV fluids appears euvolemic continue coreg, entresto and aldactone  Type 2 diabetes  Continue home regimen  Hypertension continue antihypersive as above  HLD continue statin  Financial barriers Now she does have Medicaid in place.  Please note I have written prescriptions for all her chronic medications at discharge except controlled substances if she had been on any. DISCHARGE CONDITIONS:  Stable CONSULTS OBTAINED:   DRUG ALLERGIES:   Allergies  Allergen Reactions  . Ibuprofen Anaphylaxis   DISCHARGE MEDICATIONS:   Allergies as of 09/08/2019      Reactions   Ibuprofen Anaphylaxis      Medication List    STOP taking these medications   acetaminophen 500 MG tablet Commonly known as: TYLENOL   butalbital-acetaminophen-caffeine 50-325-40 MG tablet Commonly known as: FIORICET     TAKE these medications   atorvastatin 40 MG tablet Commonly known as: LIPITOR Take 1 tablet (40 mg total) by mouth daily at 6 PM.   blood glucose meter kit and supplies Dispense based on patient and insurance preference. Check blood sugar 4 times daily, three times before meals and once before bedtime. (FOR ICD-10 E10.9, E11.9).   carvedilol 6.25 MG tablet Commonly known as: COREG Take 1 tablet (6.25 mg total) by mouth 2 (two) times daily with a meal.   cyclobenzaprine 10 MG tablet Commonly known as: FLEXERIL Take 10 mg by mouth 3 (three) times daily as needed for muscle spasms.   digoxin 0.125 MG tablet Commonly known as:  LANOXIN Take 1 tablet (0.125 mg total) by mouth daily.   empagliflozin 10 MG Tabs tablet Commonly known as: JARDIANCE Take 10 mg by mouth daily.   gabapentin 300 MG capsule Commonly known as: NEURONTIN Take 1 capsule (300 mg total) by mouth 3 (three) times daily.   Insulin Glargine 100 UNIT/ML Solostar Pen Commonly known as: LANTUS Inject 40 Units into the skin daily.   metFORMIN  500 MG tablet Commonly known as: GLUCOPHAGE Take 1 tablet (500 mg total) by mouth 2 (two) times daily with a meal.   NovoLOG FlexPen 100 UNIT/ML FlexPen Generic drug: insulin aspart Inject 10 Units into the skin 3 (three) times daily with meals.   PARoxetine 10 MG tablet Commonly known as: PAXIL Take 1 tablet (10 mg total) by mouth daily.   rizatriptan 10 MG tablet Commonly known as: MAXALT Take 1 tablet (10 mg total) by mouth as needed for migraine. May repeat in 2 hours if needed   sacubitril-valsartan 24-26 MG Commonly known as: ENTRESTO Take 1 tablet by mouth 2 (two) times daily.   spironolactone 25 MG tablet Commonly known as: ALDACTONE Take 1 tablet (25 mg total) by mouth daily.      DISCHARGE INSTRUCTIONS:   DIET:  Cardiac diet DISCHARGE CONDITION:  Stable ACTIVITY:  Activity as tolerated OXYGEN:  Home Oxygen: No.  Oxygen Delivery: room air DISCHARGE LOCATION:  home   If you experience worsening of your admission symptoms, develop shortness of breath, life threatening emergency, suicidal or homicidal thoughts you must seek medical attention immediately by calling 911 or calling your MD immediately  if symptoms less severe.  You Must read complete instructions/literature along with all the possible adverse reactions/side effects for all the Medicines you take and that have been prescribed to you. Take any new Medicines after you have completely understood and accpet all the possible adverse reactions/side effects.   Please note  You were cared for by a hospitalist during your hospital stay. If you have any questions about your discharge medications or the care you received while you were in the hospital after you are discharged, you can call the unit and asked to speak with the hospitalist on call if the hospitalist that took care of you is not available. Once you are discharged, your primary care physician will handle any further medical issues. Please note that  NO REFILLS for any discharge medications will be authorized once you are discharged, as it is imperative that you return to your primary care physician (or establish a relationship with a primary care physician if you do not have one) for your aftercare needs so that they can reassess your need for medications and monitor your lab values.    On the day of Discharge:  VITAL SIGNS:  Blood pressure 112/62, pulse 93, temperature 98.2 F (36.8 C), resp. rate 11, height 5' (1.524 m), weight 70 kg, last menstrual period 09/06/2019, SpO2 97 %. PHYSICAL EXAMINATION:  GENERAL:  34 y.o.-year-old patient lying in the bed with no acute distress.  EYES: Pupils equal, round, reactive to light and accommodation. No scleral icterus. Extraocular muscles intact.  HEENT: Head atraumatic, normocephalic. Oropharynx and nasopharynx clear.  NECK:  Supple, no jugular venous distention. No thyroid enlargement, no tenderness.  LUNGS: Normal breath sounds bilaterally, no wheezing, rales,rhonchi or crepitation. No use of accessory muscles of respiration.  CARDIOVASCULAR: S1, S2 normal. No murmurs, rubs, or gallops.  ABDOMEN: Soft, non-tender, non-distended. Bowel sounds present. No organomegaly or mass.  EXTREMITIES: No pedal  edema, cyanosis, or clubbing.  NEUROLOGIC: Cranial nerves II through XII are intact. Muscle strength 5/5 in all extremities. Sensation intact. Gait not checked.  PSYCHIATRIC: The patient is alert and oriented x 3.  SKIN: No obvious rash, lesion, or ulcer.  DATA REVIEW:   CBC Recent Labs  Lab 09/10/19 0550  WBC 11.9*  HGB 13.0  HCT 38.7  PLT 405*    Chemistries  Recent Labs  Lab 09/07/19 0459 09/08/19 0350 09/10/19 0824  NA 130*   < > 137  K 3.8   < > 4.1  CL 100   < > 100  CO2 22   < > 27  GLUCOSE 355*   < > 299*  BUN 6   < > 11  CREATININE 0.50   < > 0.59  CALCIUM 7.7*   < > 9.4  MG 1.6*  --   --   AST 60*  --  28  ALT 20  --  16  ALKPHOS 145*  --  135*  BILITOT 0.9  --   0.7   < > = values in this interval not displayed.     Microbiology Results  Covid resulted positive on 09/07/2019  Follow-up Information    Lorelee Market, MD. Go on 09/19/2019.   Specialty: Family Medicine Why: at 3:30 p.m. Contact information: [place] [address] [phone]        Bensimhon, Shaune Pascal, MD. Schedule an appointment as soon as possible for a visit in 2 weeks.   Specialty: Cardiology Contact information: [address] [place] [address] [phone]          Patient wanted to go home.  Did not have any other complaints.  Management plans discussed with the patient, family and they are in agreement.  CODE STATUS: Full code  TOTAL TIME TAKING CARE OF THIS PATIENT: 45 minutes.    Max Sane M.D on 09/10/2019 at 2:23 PM  [facility]   CC: Primary care physician; Lorelee Market, MD   Note: This dictation was prepared with Dragon dictation along with smaller phrase technology. Any transcriptional errors that result from this process are unintentional.

## 2019-09-10 NOTE — ED Notes (Signed)
Pt verbalized understanding of discharge instructions. NAD at this time. 

## 2019-09-10 NOTE — ED Notes (Signed)
Dr. York Cerise made aware of BP of 169/127.

## 2019-09-10 NOTE — ED Triage Notes (Signed)
Pt arrived via [facility] from home, reports experiencing left shoulder pain over the past few days and woke up this morning with increased pain. Pt reports being diagnosed with COVID a few weeks ago, and being discharged from the hospital 2 days ago. Reports hx of HTN and CHF.

## 2019-09-10 NOTE — Discharge Instructions (Addendum)
Your work-up was reassuring.  You are not requiring oxygen and you had no fever.  You are currently being treated for Covid and still should remain quarantine at home.  Your chest x-ray was consistent with your Covid pneumonia.  Your shoulder pain seem musculoskeletal in nature given it hurt when I push on it or when you moved it.  We discussed CT scan but elected to hold off at this time given low suspicion for pulmonary embolism.  At this time I think it be reasonable to start off treating it with Tylenol 1 g every 8 hours and oxycodone for breakthrough pain.  However you should have low threshold to return to the ER if you develop shortness of breath, fevers, redness of the skin, swelling of the arm or any other concerns.

## 2019-11-07 ENCOUNTER — Encounter: Payer: Self-pay | Admitting: Internal Medicine

## 2019-11-07 ENCOUNTER — Inpatient Hospital Stay (HOSPITAL_COMMUNITY)
Admit: 2019-11-07 | Discharge: 2019-11-07 | Disposition: A | Discharge status: Home / Self Care | Payer: Medicaid Other | Attending: Hospitalist | Admitting: Hospitalist

## 2019-11-07 ENCOUNTER — Emergency Department: Payer: Medicaid Other

## 2019-11-07 ENCOUNTER — Inpatient Hospital Stay
Admission: EM | Admit: 2019-11-07 | Discharge: 2019-11-10 | DRG: 293 | Disposition: A | Discharge status: Home / Self Care | Payer: Medicaid Other | Attending: Internal Medicine | Admitting: Internal Medicine

## 2019-11-07 ENCOUNTER — Other Ambulatory Visit: Payer: Self-pay

## 2019-11-07 DIAGNOSIS — I5043 Acute on chronic combined systolic (congestive) and diastolic (congestive) heart failure: Secondary | ICD-10-CM | POA: Diagnosis present

## 2019-11-07 DIAGNOSIS — Z560 Unemployment, unspecified: Secondary | ICD-10-CM

## 2019-11-07 DIAGNOSIS — Z599 Problem related to housing and economic circumstances, unspecified: Secondary | ICD-10-CM

## 2019-11-07 DIAGNOSIS — E1165 Type 2 diabetes mellitus with hyperglycemia: Secondary | ICD-10-CM | POA: Diagnosis present

## 2019-11-07 DIAGNOSIS — Z8616 Personal history of COVID-19: Secondary | ICD-10-CM | POA: Diagnosis not present

## 2019-11-07 DIAGNOSIS — I10 Essential (primary) hypertension: Secondary | ICD-10-CM

## 2019-11-07 DIAGNOSIS — Z794 Long term (current) use of insulin: Secondary | ICD-10-CM

## 2019-11-07 DIAGNOSIS — I371 Nonrheumatic pulmonary valve insufficiency: Secondary | ICD-10-CM | POA: Diagnosis not present

## 2019-11-07 DIAGNOSIS — R7989 Other specified abnormal findings of blood chemistry: Secondary | ICD-10-CM

## 2019-11-07 DIAGNOSIS — Z8249 Family history of ischemic heart disease and other diseases of the circulatory system: Secondary | ICD-10-CM | POA: Diagnosis not present

## 2019-11-07 DIAGNOSIS — Z20822 Contact with and (suspected) exposure to covid-19: Secondary | ICD-10-CM | POA: Diagnosis present

## 2019-11-07 DIAGNOSIS — E876 Hypokalemia: Secondary | ICD-10-CM | POA: Diagnosis present

## 2019-11-07 DIAGNOSIS — I110 Hypertensive heart disease with heart failure: Secondary | ICD-10-CM | POA: Diagnosis not present

## 2019-11-07 DIAGNOSIS — Z9114 Patient's other noncompliance with medication regimen: Secondary | ICD-10-CM | POA: Diagnosis not present

## 2019-11-07 DIAGNOSIS — U071 COVID-19: Secondary | ICD-10-CM | POA: Diagnosis not present

## 2019-11-07 DIAGNOSIS — Z9119 Patient's noncompliance with other medical treatment and regimen: Secondary | ICD-10-CM | POA: Diagnosis not present

## 2019-11-07 DIAGNOSIS — R0602 Shortness of breath: Secondary | ICD-10-CM | POA: Diagnosis not present

## 2019-11-07 DIAGNOSIS — E119 Type 2 diabetes mellitus without complications: Secondary | ICD-10-CM | POA: Diagnosis present

## 2019-11-07 DIAGNOSIS — I361 Nonrheumatic tricuspid (valve) insufficiency: Secondary | ICD-10-CM | POA: Diagnosis not present

## 2019-11-07 DIAGNOSIS — G43909 Migraine, unspecified, not intractable, without status migrainosus: Secondary | ICD-10-CM | POA: Diagnosis present

## 2019-11-07 DIAGNOSIS — I420 Dilated cardiomyopathy: Secondary | ICD-10-CM | POA: Diagnosis not present

## 2019-11-07 DIAGNOSIS — R778 Other specified abnormalities of plasma proteins: Secondary | ICD-10-CM

## 2019-11-07 DIAGNOSIS — R000 Tachycardia, unspecified: Secondary | ICD-10-CM | POA: Diagnosis not present

## 2019-11-07 DIAGNOSIS — E118 Type 2 diabetes mellitus with unspecified complications: Secondary | ICD-10-CM | POA: Diagnosis not present

## 2019-11-07 HISTORY — DX: Personal history of COVID-19: Z86.16

## 2019-11-07 LAB — COMPREHENSIVE METABOLIC PANEL
ALT: 15 U/L (ref 0–44)
AST: 36 U/L (ref 15–41)
Albumin: 2.8 g/dL — ABNORMAL LOW (ref 3.5–5.0)
Alkaline Phosphatase: 126 U/L (ref 38–126)
Anion gap: 9 (ref 5–15)
BUN: 9 mg/dL (ref 6–20)
CO2: 26 mmol/L (ref 22–32)
Calcium: 8.6 mg/dL — ABNORMAL LOW (ref 8.9–10.3)
Chloride: 97 mmol/L — ABNORMAL LOW (ref 98–111)
Creatinine, Ser: 0.55 mg/dL (ref 0.44–1.00)
GFR calc Af Amer: >60 mL/min (ref >60)
GFR calc non Af Amer: >60 mL/min (ref >60)
Glucose, Bld: 490 mg/dL — ABNORMAL HIGH (ref 70–99)
Potassium: 3.9 mmol/L (ref 3.5–5.1)
Sodium: 132 mmol/L — ABNORMAL LOW (ref 135–145)
Total Bilirubin: 0.6 mg/dL (ref 0.3–1.2)
Total Protein: 6.8 g/dL (ref 6.5–8.1)

## 2019-11-07 LAB — POC SARS CORONAVIRUS 2 AG: SARS Coronavirus 2 Ag: NEGATIVE

## 2019-11-07 LAB — TROPONIN I (HIGH SENSITIVITY)
Troponin I (High Sensitivity): 43 ng/L — ABNORMAL HIGH (ref <18)
Troponin I (High Sensitivity): 40 ng/L — ABNORMAL HIGH (ref <18)

## 2019-11-07 LAB — GLUCOSE, CAPILLARY
Glucose-Capillary: 393 mg/dL — ABNORMAL HIGH (ref 70–99)
Glucose-Capillary: 375 mg/dL — ABNORMAL HIGH (ref 70–99)
Glucose-Capillary: 99 mg/dL (ref 70–99)
Glucose-Capillary: 360 mg/dL — ABNORMAL HIGH (ref 70–99)

## 2019-11-07 LAB — CBC
HCT: 37.8 % (ref 36.0–46.0)
Hemoglobin: 12.5 g/dL (ref 12.0–15.0)
MCH: 29.6 pg (ref 26.0–34.0)
MCHC: 33.1 g/dL (ref 30.0–36.0)
MCV: 89.4 fL (ref 80.0–100.0)
Platelets: 330 10*3/uL (ref 150–400)
RBC: 4.23 MIL/uL (ref 3.87–5.11)
RDW: 13.2 % (ref 11.5–15.5)
WBC: 10.7 10*3/uL — ABNORMAL HIGH (ref 4.0–10.5)
nRBC: 0 % (ref 0.0–0.2)

## 2019-11-07 LAB — DIGOXIN LEVEL: Digoxin Level: <0.2 ng/mL — ABNORMAL LOW (ref 0.8–2.0)

## 2019-11-07 LAB — HEMOGLOBIN A1C
Hgb A1c MFr Bld: 13.8 % — ABNORMAL HIGH (ref 4.8–5.6)
Mean Plasma Glucose: 349.36 mg/dL

## 2019-11-07 LAB — POCT PREGNANCY, URINE: Preg Test, Ur: NEGATIVE

## 2019-11-07 LAB — BRAIN NATRIURETIC PEPTIDE: B Natriuretic Peptide: 965 pg/mL — ABNORMAL HIGH (ref 0.0–100.0)

## 2019-11-07 MED ORDER — FUROSEMIDE 10 MG/ML IJ SOLN
40.0000 mg | Freq: Once | INTRAMUSCULAR | Status: AC
  Administered 2019-11-07: 40 mg via INTRAVENOUS
  Filled 2019-11-07: qty 4

## 2019-11-07 MED ORDER — INSULIN ASPART 100 UNIT/ML ~~LOC~~ SOLN: 0.0000 [IU] | Freq: Every day | SUBCUTANEOUS | Status: DC

## 2019-11-07 MED ORDER — ATORVASTATIN CALCIUM 20 MG PO TABS
40.0000 mg | ORAL_TABLET | Freq: Every day | ORAL | Status: DC
  Administered 2019-11-07 – 2019-11-09 (×3): 40 mg via ORAL
  Filled 2019-11-07 (×3): qty 2

## 2019-11-07 MED ORDER — SPIRONOLACTONE 25 MG PO TABS
25.0000 mg | ORAL_TABLET | Freq: Every day | ORAL | Status: DC
  Administered 2019-11-07 – 2019-11-10 (×4): 25 mg via ORAL
  Filled 2019-11-07 (×4): qty 1

## 2019-11-07 MED ORDER — ENOXAPARIN SODIUM 40 MG/0.4ML ~~LOC~~ SOLN
40.0000 mg | SUBCUTANEOUS | Status: DC
  Administered 2019-11-07 – 2019-11-10 (×4): 40 mg via SUBCUTANEOUS
  Filled 2019-11-07 (×4): qty 0.4

## 2019-11-07 MED ORDER — CARVEDILOL 6.25 MG PO TABS: 6.2500 mg | ORAL_TABLET | Freq: Two times a day (BID) | ORAL | Status: DC

## 2019-11-07 MED ORDER — PNEUMOCOCCAL VAC POLYVALENT 25 MCG/0.5ML IJ INJ: 0.5000 mL | INJECTION | INTRAMUSCULAR | Status: DC

## 2019-11-07 MED ORDER — CARVEDILOL 6.25 MG PO TABS
6.2500 mg | ORAL_TABLET | Freq: Two times a day (BID) | ORAL | Status: DC
  Administered 2019-11-07 – 2019-11-10 (×8): 6.25 mg via ORAL
  Filled 2019-11-07 (×8): qty 1

## 2019-11-07 MED ORDER — INSULIN ASPART 100 UNIT/ML ~~LOC~~ SOLN
0.0000 [IU] | Freq: Three times a day (TID) | SUBCUTANEOUS | Status: DC
  Administered 2019-11-07 (×2): 15 [IU] via SUBCUTANEOUS
  Administered 2019-11-08: 5 [IU] via SUBCUTANEOUS
  Administered 2019-11-08: 2 [IU] via SUBCUTANEOUS
  Administered 2019-11-08: 3 [IU] via SUBCUTANEOUS
  Administered 2019-11-09: 8 [IU] via SUBCUTANEOUS
  Filled 2019-11-07 (×6): qty 1

## 2019-11-07 MED ORDER — FUROSEMIDE 10 MG/ML IJ SOLN: 40.0000 mg | Freq: Two times a day (BID) | INTRAMUSCULAR | Status: DC

## 2019-11-07 MED ORDER — SODIUM CHLORIDE 0.9 % IV SOLN: 250.0000 mL | INTRAVENOUS | Status: DC | PRN

## 2019-11-07 MED ORDER — ACETAMINOPHEN 325 MG PO TABS
650.0000 mg | ORAL_TABLET | ORAL | Status: DC | PRN
  Administered 2019-11-09 – 2019-11-10 (×2): 650 mg via ORAL
  Filled 2019-11-07 (×2): qty 2

## 2019-11-07 MED ORDER — PAROXETINE HCL 10 MG PO TABS
10.0000 mg | ORAL_TABLET | Freq: Every day | ORAL | Status: DC
  Administered 2019-11-07 – 2019-11-10 (×4): 10 mg via ORAL
  Filled 2019-11-07 (×4): qty 1

## 2019-11-07 MED ORDER — DIGOXIN 125 MCG PO TABS
0.1250 mg | ORAL_TABLET | Freq: Every day | ORAL | Status: DC
  Administered 2019-11-07 – 2019-11-10 (×4): 0.125 mg via ORAL
  Filled 2019-11-07 (×4): qty 1

## 2019-11-07 MED ORDER — FUROSEMIDE 10 MG/ML IJ SOLN
40.0000 mg | Freq: Once | INTRAMUSCULAR | Status: AC
  Administered 2019-11-07: 04:00:00 40 mg via INTRAVENOUS
  Filled 2019-11-07: qty 4

## 2019-11-07 MED ORDER — SACUBITRIL-VALSARTAN 24-26 MG PO TABS
1.0000 | ORAL_TABLET | Freq: Two times a day (BID) | ORAL | Status: DC
  Administered 2019-11-07 – 2019-11-08 (×3): 1 via ORAL
  Filled 2019-11-07 (×3): qty 1

## 2019-11-07 MED ORDER — SODIUM CHLORIDE 0.9% FLUSH
3.0000 mL | Freq: Two times a day (BID) | INTRAVENOUS | Status: DC
  Administered 2019-11-07 – 2019-11-10 (×7): 3 mL via INTRAVENOUS

## 2019-11-07 MED ORDER — SUMATRIPTAN SUCCINATE 50 MG PO TABS
50.0000 mg | ORAL_TABLET | ORAL | Status: DC | PRN
  Filled 2019-11-07: qty 1

## 2019-11-07 MED ORDER — FUROSEMIDE 10 MG/ML IJ SOLN
20.0000 mg | Freq: Two times a day (BID) | INTRAMUSCULAR | Status: DC
  Administered 2019-11-07: 20 mg via INTRAVENOUS
  Filled 2019-11-07: qty 2

## 2019-11-07 MED ORDER — GABAPENTIN 300 MG PO CAPS
300.0000 mg | ORAL_CAPSULE | Freq: Three times a day (TID) | ORAL | Status: DC
  Administered 2019-11-07 – 2019-11-10 (×11): 300 mg via ORAL
  Filled 2019-11-07 (×11): qty 1

## 2019-11-07 MED ORDER — SODIUM CHLORIDE 0.9% FLUSH: 3.0000 mL | INTRAVENOUS | Status: DC | PRN

## 2019-11-07 MED ORDER — SPIRONOLACTONE 25 MG PO TABS: 25.0000 mg | ORAL_TABLET | Freq: Every day | ORAL | Status: DC

## 2019-11-07 MED ORDER — INSULIN ASPART 100 UNIT/ML ~~LOC~~ SOLN
10.0000 [IU] | Freq: Three times a day (TID) | SUBCUTANEOUS | Status: DC
  Administered 2019-11-07 – 2019-11-09 (×5): 10 [IU] via SUBCUTANEOUS
  Filled 2019-11-07 (×5): qty 1

## 2019-11-07 MED ORDER — ONDANSETRON HCL 4 MG/2ML IJ SOLN: 4.0000 mg | Freq: Four times a day (QID) | INTRAMUSCULAR | Status: DC | PRN

## 2019-11-07 MED ORDER — INSULIN GLARGINE 100 UNIT/ML ~~LOC~~ SOLN
40.0000 [IU] | Freq: Every day | SUBCUTANEOUS | Status: DC
  Administered 2019-11-07 – 2019-11-09 (×3): 40 [IU] via SUBCUTANEOUS
  Filled 2019-11-07 (×3): qty 0.4

## 2019-11-07 NOTE — Progress Notes (Signed)
PROGRESS NOTE    Cheryl Mercer  XIP:[id number] DOB: September 23, 1985 DOA: 11/07/2019 PCP: Evelene Croon, MD    Assessment & Plan:   Principal Problem:   Acute on chronic combined systolic and diastolic CHF (congestive heart failure) (HCC) Active Problems:   Uncontrolled type 2 diabetes mellitus with hyperglycemia (HCC)   Essential hypertension   History of COVID-19   Acute on chronic combined systolic (congestive) and diastolic (congestive) heart failure (HCC)   Elevated troponin    Cheryl Mercer is a 34 y.o. female with medical history significant for chronic combined systolic and diastolic CHF, last EF 10 to 15%, with poor outpatient follow-up, diabetes mellitus, hypertension, hyperlipidemia and history of hospitalization for COVID-19 pneumonia on 09/06/2019, who presents to the emergency room with a several day history of shortness of breath, lower extremity edema and orthopnea.     Acute on chronic combined systolic and diastolic CHF exacerbation # Nonischemic cardiomyopathy -Last EF 10 to 15% --BNP resulted at 965 -Patient with poor compliance with medication and follow-up for social/financial reasons --diuresed well with IV Lasix 40 mg with improvement in symptoms PLAN: --reduce Lasix to 20 mg IV twice daily, per cards -continue Coreg, digoxin, Entresto as well as spironolactone  -Strict intake and output monitoring and daily weights as well as salt restriction -Repeat echocardiogram     Elevated troponin -Suspect related to demand ischemia from heart failure exacerbation.  Patient has no chest pain and EKG is nonacute -trop 40's flat -Daily aspirin    Uncontrolled type 2 diabetes mellitus with hyperglycemia (HCC) -A1c 13.8.  Pt said she hadn't given herself insulin for at least 2 months because she couldn't afford the needles for the insulin pens. --Lantus 40u daily --Meal-time 10u TID -SSI TID    Essential hypertension -Blood pressure on  arrival 139/105 -continue Coreg, Lasix, Entresto as well as spironolactone      History of COVID-19 -No acute concerns at this time   DVT prophylaxis: Lovenox SQ Code Status: Full code  Family Communication: Daughter updated at bedside today Disposition Plan: Home likely tomorrow   Subjective and Interval History:  Pt reported breathing and swelling much better today.  Put out a lot of urine (not all charted) in response to IV lasix.  No fever, chest pain, abdominal pain, N/V/D, dysuria.   Objective: Vitals:   11/07/19 0620 11/07/19 0728 11/07/19 1120 11/07/19 1547  BP: (!) 154/117 (!) 166/123 120/82 125/85  Pulse: (!) 116 (!) 115 88 95  Resp: 19 20 20 20   Temp: 98.2 F (36.8 C) 98.1 F (36.7 C) 97.6 F (36.4 C) 98.6 F (37 C)  TempSrc: Oral Oral Oral Oral  SpO2: 99% 98% 99% 100%  Weight:      Height:        Intake/Output Summary (Last 24 hours) at 11/07/2019 1751 Last data filed at 11/07/2019 1548 Gross per 24 hour  Intake 480 ml  Output 1200 ml  Net -720 ml   Filed Weights   11/07/19 0258  Weight: 73.9 kg    Examination:   Constitutional: NAD, AAOx3 HEENT: conjunctivae and lids normal, EOMI CV: RRR no M,R,G. Distal pulses +2.  No cyanosis.   RESP: CTA B/L, normal respiratory effort  GI: +BS, NTND Extremities: No effusions, edema, or tenderness in BLE MSK: no joint enlargement or tenderness of both UE and LE SKIN: warm, dry and intact Neuro: II - XII grossly intact.  Sensation intact Psych: Normal mood and affect.  Appropriate judgement and reason  Data Reviewed: I have personally reviewed following labs and imaging studies  CBC: Recent Labs  Lab 11/07/19 0307  WBC 10.7*  HGB 12.5  HCT 37.8  MCV 89.4  PLT 063   Basic Metabolic Panel: Recent Labs  Lab 11/07/19 0307  NA 132*  K 3.9  CL 97*  CO2 26  GLUCOSE 490*  BUN 9  CREATININE 0.55  CALCIUM 8.6*   GFR: Estimated Creatinine Clearance: 92.8 mL/min (by C-G formula based on SCr of  0.55 mg/dL). Liver Function Tests: Recent Labs  Lab 11/07/19 0307  AST 36  ALT 15  ALKPHOS 126  BILITOT 0.6  PROT 6.8  ALBUMIN 2.8*   No results for input(s): LIPASE, AMYLASE in the last 168 hours. No results for input(s): AMMONIA in the last 168 hours. Coagulation Profile: No results for input(s): INR, PROTIME in the last 168 hours. Cardiac Enzymes: No results for input(s): CKTOTAL, CKMB, CKMBINDEX, TROPONINI in the last 168 hours. BNP (last 3 results) No results for input(s): PROBNP in the last 8760 hours. HbA1C: Recent Labs    11/07/19 0728  HGBA1C 13.8*   CBG: Recent Labs  Lab 11/07/19 0730 11/07/19 1121 11/07/19 1632  GLUCAP 393* 375* 99   Lipid Profile: No results for input(s): CHOL, HDL, LDLCALC, TRIG, CHOLHDL, LDLDIRECT in the last 72 hours. Thyroid Function Tests: No results for input(s): TSH, T4TOTAL, FREET4, T3FREE, THYROIDAB in the last 72 hours. Anemia Panel: No results for input(s): VITAMINB12, FOLATE, FERRITIN, TIBC, IRON, RETICCTPCT in the last 72 hours. Sepsis Labs: No results for input(s): PROCALCITON, LATICACIDVEN in the last 168 hours.  No results found for this or any previous visit (from the past 240 hour(s)).    Radiology Studies: DG Chest 1 View  Result Date: 11/07/2019 CLINICAL DATA:  Initial evaluation for acute shortness of breath. EXAM: CHEST  1 VIEW COMPARISON:  Prior radiograph from 09/10/2019. FINDINGS: Cardiomegaly, stable.  Mediastinal silhouette within normal limits. Lungs normally inflated. Perihilar vascular congestion without overt pulmonary edema. No pleural effusion. No consolidative airspace disease. No pneumothorax. No acute osseous finding. IMPRESSION: 1. Cardiomegaly with mild perihilar vascular congestion without overt pulmonary edema. 2. No other active cardiopulmonary disease. Electronically Signed   By: Jeannine Boga M.D.   On: 11/07/2019 03:36     Scheduled Meds: . atorvastatin  40 mg Oral q1800  .  carvedilol  6.25 mg Oral BID WC  . digoxin  0.125 mg Oral Daily  . enoxaparin (LOVENOX) injection  40 mg Subcutaneous Q24H  . furosemide  20 mg Intravenous BID  . gabapentin  300 mg Oral TID  . insulin aspart  0-15 Units Subcutaneous TID WC  . insulin aspart  10 Units Subcutaneous TID WC  . insulin glargine  40 Units Subcutaneous Daily  . PARoxetine  10 mg Oral Daily  . [START ON 11/08/2019] pneumococcal 23 valent vaccine  0.5 mL Intramuscular Tomorrow-1000  . sacubitril-valsartan  1 tablet Oral BID  . sodium chloride flush  3 mL Intravenous Q12H  . spironolactone  25 mg Oral Daily   Continuous Infusions: . sodium chloride       LOS: 0 days     Enzo Bi, MD [facility] If 7PM-7AM, please contact night-coverage 11/07/2019, 5:51 PM

## 2019-11-07 NOTE — ED Triage Notes (Signed)
Pt complains of shob, swollen ankles, loss of taste. Pt with history of CHF. Pt with noted tachypnea and tachycardia. Pt with 3+ bilateral ankle swelling noted. Pt complains of shob when lying down.

## 2019-11-07 NOTE — ED Provider Notes (Signed)
[facility] Emergency Department Provider Note  ____________________________________________   First MD Initiated Contact with Patient 11/07/19 [phone]     (approximate)  I have reviewed the triage vital signs and the nursing notes.   HISTORY  Chief Complaint Shortness of Breath    HPI Cheryl Mercer is a 34 y.o. female with below list of previous medical conditions including diabetes mellitus cardiomyopathy/CHF with last EF of 10 to 15% noted presents emergency department with progressive dyspnea, orthopnea bilateral lower extremity swelling x3 days.  Patient states that she was noncompliant with her Lasix secondary to cost however does admit that she took 2 doses today.  Patient also admits to a loss of taste that the patient states began yesterday.  Patient admits to central chest discomfort with current score of 7 out of     Past Medical History:  Diagnosis Date  . Chronic back pain   . Diabetes mellitus without complication ([place])   . Migraines   . No pertinent past medical history   . Pregnancy induced hypertension     Patient Active Problem List   Diagnosis Date Noted  . History of COVID-19 11/07/2019  . Acute on chronic combined systolic (congestive) and diastolic (congestive) heart failure ([place]) 11/07/2019  . Elevated troponin 11/07/2019  . Viral pneumonia   . Tachycardia   . Multifocal pneumonia 09/07/2019  . Chronic combined systolic and diastolic congestive heart failure ([place]) 09/07/2019  . Dyslipidemia 09/07/2019  . Back pain 09/07/2019  . Atypical pneumonia 09/07/2019  . Essential hypertension 03/24/2019  . Uncontrolled type 2 diabetes mellitus with hyperglycemia ([place]) 03/23/2019  . Peripheral edema 03/23/2019  . Cardiomegaly 03/23/2019  . Acute on chronic combined systolic and diastolic CHF (congestive heart failure) ([place]) 03/23/2019  . Proteinuria 03/23/2019  . Acute bacterial tonsillitis 05/18/2013    Class: Acute  .  UTI (lower urinary tract infection) 07/30/2012    Past Surgical History:  Procedure Laterality Date  . CERVICAL BIOPSY  W/ LOOP ELECTRODE EXCISION  2009  . Laparoscopic tubal sterilization with Falope rings.  2009  . RIGHT/LEFT HEART CATH AND CORONARY ANGIOGRAPHY N/A 03/25/2019   Procedure: RIGHT/LEFT HEART CATH AND CORONARY ANGIOGRAPHY;  Surgeon: Jolaine Artist, MD;  Location: [facility];  Service: Cardiovascular;  Laterality: N/A;  . TUBAL LIGATION      Prior to Admission medications   Medication Sig Start Date End Date Taking? Authorizing Provider  atorvastatin (LIPITOR) 40 MG tablet Take 1 tablet (40 mg total) by mouth daily at 6 PM. 09/08/19  Yes Max Sane, MD  carvedilol (COREG) 6.25 MG tablet Take 1 tablet (6.25 mg total) by mouth 2 (two) times daily with a meal. 09/08/19  Yes Max Sane, MD  cyclobenzaprine (FLEXERIL) 10 MG tablet Take 10 mg by mouth 3 (three) times daily as needed for muscle spasms. 04/07/19  Yes [provider]  digoxin (LANOXIN) 0.125 MG tablet Take 1 tablet (0.125 mg total) by mouth daily. 09/08/19  Yes Max Sane, MD  empagliflozin (JARDIANCE) 10 MG TABS tablet Take 10 mg by mouth daily. 09/08/19  Yes Max Sane, MD  gabapentin (NEURONTIN) 300 MG capsule Take 1 capsule (300 mg total) by mouth 3 (three) times daily. 09/08/19  Yes Max Sane, MD  insulin aspart (NOVOLOG FLEXPEN) 100 UNIT/ML FlexPen Inject 10 Units into the skin 3 (three) times daily with meals. 09/08/19  Yes Max Sane, MD  Insulin Glargine (LANTUS) 100 UNIT/ML Solostar Pen Inject 40 Units into the skin daily.  09/08/19  Yes Max Sane, MD  metFORMIN (GLUCOPHAGE) 500 MG tablet Take 1 tablet (500 mg total) by mouth 2 (two) times daily with a meal. 09/08/19  Yes Max Sane, MD  PARoxetine (PAXIL) 10 MG tablet Take 1 tablet (10 mg total) by mouth daily. 09/08/19  Yes Max Sane, MD  rizatriptan (MAXALT) 10 MG tablet Take 1 tablet (10 mg total) by mouth as needed for migraine. May  repeat in 2 hours if needed 09/08/19  Yes Max Sane, MD  sacubitril-valsartan (ENTRESTO) 24-26 MG Take 1 tablet by mouth 2 (two) times daily. 09/08/19  Yes Max Sane, MD  spironolactone (ALDACTONE) 25 MG tablet Take 1 tablet (25 mg total) by mouth daily. 09/08/19  Yes Max Sane, MD  blood glucose meter kit and supplies Dispense based on patient and insurance preference. Check blood sugar 4 times daily, three times before meals and once before bedtime. (FOR ICD-10 E10.9, E11.9). 09/08/19   Max Sane, MD    Allergies Ibuprofen  Family History  Problem Relation Age of Onset  . Hypertension Mother   . Anesthesia problems Neg Hx     Social History Social History   Tobacco Use  . Smoking status: Never Smoker  . Smokeless tobacco: Never Used  Substance Use Topics  . Alcohol use: No    Alcohol/week: 6.0 standard drinks    Types: 6 Shots of liquor per week  . Drug use: No    Types: Marijuana    Review of Systems Constitutional: No fever/chills Eyes: No visual changes. ENT: No sore throat. Cardiovascular: Positive for chest pain. Respiratory: Positive for shortness of breath. Gastrointestinal: No abdominal pain.  No nausea, no vomiting.  No diarrhea.  No constipation. Genitourinary: Negative for dysuria. Musculoskeletal: Negative for neck pain.  Negative for back pain.  Positive for lower extremity edema Integumentary: Negative for rash. Neurological: Negative for headaches, focal weakness or numbness.   ____________________________________________   PHYSICAL EXAM:  VITAL SIGNS: ED Triage Vitals [11/07/19 0258]  Enc Vitals Group     BP (!) 139/105     Pulse Rate (!) 128     Resp (!) 26     Temp 99 F (37.2 C)     Temp Source Oral     SpO2 96 %     Weight 73.9 kg (163 lb)     Height 1.549 m (5' 1" )     Head Circumference      Peak Flow      Pain Score 7     Pain Loc      Pain Edu?      Excl. in [place]?     Constitutional: Alert and oriented.  Apparent  dyspnea Eyes: Conjunctivae are normal.  Mouth/Throat: Patient is wearing a mask. Neck: No stridor.  No meningeal signs.   Cardiovascular: Normal rate, regular rhythm. Good peripheral circulation. Grossly normal heart sounds. Respiratory: Tachypnea with bibasilar rhonchi Gastrointestinal: Soft and nontender. No distention.   Musculoskeletal: 2+ lower extremity pitting edema. No gross deformities of extremities. Neurologic:  Normal speech and language. No gross focal neurologic deficits are appreciated.  Skin:  Skin is warm, dry and intact. Psychiatric: Mood and affect are normal. Speech and behavior are normal.  ____________________________________________   LABS (all labs ordered are listed, but only abnormal results are displayed)  Labs Reviewed  CBC - Abnormal; Notable for the following components:      Result Value   WBC 10.7 (*)    All other components within normal limits  COMPREHENSIVE METABOLIC PANEL - Abnormal; Notable for the following components:   Sodium 132 (*)    Chloride 97 (*)    Glucose, Bld 490 (*)    Calcium 8.6 (*)    Albumin 2.8 (*)    All other components within normal limits  BRAIN NATRIURETIC PEPTIDE - Abnormal; Notable for the following components:   B Natriuretic Peptide 965.0 (*)    All other components within normal limits  TROPONIN I (HIGH SENSITIVITY) - Abnormal; Notable for the following components:   Troponin I (High Sensitivity) 43 (*)    All other components within normal limits  HEMOGLOBIN A1C  POC SARS CORONAVIRUS 2 AG -  ED  POC SARS CORONAVIRUS 2 AG  POC URINE PREG, ED  TROPONIN I (HIGH SENSITIVITY)   ____________________________________________  EKG  ED ECG REPORT I, [place] Naveen Lorusso, the attending physician, personally viewed and interpreted this ECG.   Date: 11/07/2019  EKG Time: 3:04 AM  Rate: 112  Rhythm: Sinus tachycardia  Axis: Normal  Intervals: Normal  ST&T Change:  None  ____________________________________________  RADIOLOGY I, [place] N Jonella Redditt, personally viewed and evaluated these images (plain radiographs) as part of my medical decision making, as well as reviewing the written report by the radiologist.  ED MD interpretation: Cardiomegaly with perihilar vascular congestion without overt pulmonary edema on chest x-ray per radiologist.  Official radiology report(s): DG Chest 1 View  Result Date: 11/07/2019 CLINICAL DATA:  Initial evaluation for acute shortness of breath. EXAM: CHEST  1 VIEW COMPARISON:  Prior radiograph from 09/10/2019. FINDINGS: Cardiomegaly, stable.  Mediastinal silhouette within normal limits. Lungs normally inflated. Perihilar vascular congestion without overt pulmonary edema. No pleural effusion. No consolidative airspace disease. No pneumothorax. No acute osseous finding. IMPRESSION: 1. Cardiomegaly with mild perihilar vascular congestion without overt pulmonary edema. 2. No other active cardiopulmonary disease. Electronically Signed   By: Jeannine Boga M.D.   On: 11/07/2019 03:36    ____________________________________________   PROCEDURES     .Critical Care Performed by: Gregor Hams, MD Authorized by: Gregor Hams, MD   Critical care provider statement:    Critical care time (minutes):  30   Critical care time was exclusive of:  Separately billable procedures and treating other patients   Critical care was necessary to treat or prevent imminent or life-threatening deterioration of the following conditions:  Cardiac failure   Critical care was time spent personally by me on the following activities:  Development of treatment plan with patient or surrogate, discussions with consultants, evaluation of patient's response to treatment, examination of patient, obtaining history from patient or surrogate, ordering and performing treatments and interventions, ordering and review of laboratory studies, ordering  and review of radiographic studies, pulse oximetry, re-evaluation of patient's condition and review of old charts     ____________________________________________   INITIAL IMPRESSION / MDM / [place] / ED COURSE  As part of my medical decision making, I reviewed the following data within the electronic MEDICAL RECORD NUMBER   34 year old female presented with above-stated history and physical exam concerning for acute on chronic CHF exacerbation.  Given tachypnea tachycardia and clinical findings patient given 40 mg of IV Lasix with symptomatic improvement.  Laboratory data notable for BNP of 965 glucose of 490 troponin of 43.  Patient discussed with Dr. Damita Dunnings for hospital admission for further evaluation and management Dr. Owens Shark  ____________________________________________  FINAL CLINICAL IMPRESSION(S) / ED DIAGNOSES  Final diagnoses:  Acute on chronic combined systolic and diastolic  heart failure (HCC)     MEDICATIONS GIVEN DURING THIS VISIT:  Medications  sodium chloride flush (NS) 0.9 % injection 3 mL (has no administration in time range)  sodium chloride flush (NS) 0.9 % injection 3 mL (has no administration in time range)  0.9 %  sodium chloride infusion (has no administration in time range)  acetaminophen (TYLENOL) tablet 650 mg (has no administration in time range)  ondansetron (ZOFRAN) injection 4 mg (has no administration in time range)  enoxaparin (LOVENOX) injection 40 mg (has no administration in time range)  furosemide (LASIX) injection 40 mg (has no administration in time range)  spironolactone (ALDACTONE) tablet 25 mg (has no administration in time range)  sacubitril-valsartan (ENTRESTO) 24-26 mg per tablet (has no administration in time range)  carvedilol (COREG) tablet 6.25 mg (has no administration in time range)  insulin aspart (novoLOG) injection 0-15 Units (has no administration in time range)  insulin aspart (novoLOG) injection 0-5 Units (has no  administration in time range)  SUMAtriptan (IMITREX) tablet 50 mg (has no administration in time range)  digoxin (LANOXIN) tablet 0.125 mg (has no administration in time range)  furosemide (LASIX) injection 40 mg (40 mg Intravenous Given 11/07/19 1427)     ED Discharge Orders    None      *Please note:  Cheryl Mercer was evaluated in Emergency Department on 11/07/2019 for the symptoms described in the history of present illness. She was evaluated in the context of the global COVID-19 pandemic, which necessitated consideration that the patient might be at risk for infection with the SARS-CoV-2 virus that causes COVID-19. Institutional protocols and algorithms that pertain to the evaluation of patients at risk for COVID-19 are in a state of rapid change based on information released by regulatory bodies including the CDC and federal and state organizations. These policies and algorithms were followed during the patient's care in the ED.  Some ED evaluations and interventions may be delayed as a result of limited staffing during the pandemic.*  Note:  This document was prepared using Dragon voice recognition software and may include unintentional dictation errors.   Gregor Hams, MD 11/07/19 [phone]

## 2019-11-07 NOTE — Plan of Care (Addendum)
VSS. Chronic back pain managed w/ scheduled gabapentin. Up ad lib, independent w/ needs. IV diuresis continued per order. Assessment as documented. POC reviewed, no new needs/concerns at this time.   Problem: Education: Goal: Knowledge of General Education information will improve Description: Including pain rating scale, medication(s)/side effects and non-pharmacologic comfort measures Outcome: Progressing   Problem: Health Behavior/Discharge Planning: Goal: Ability to manage health-related needs will improve Outcome: Progressing   Problem: Clinical Measurements: Goal: Ability to maintain clinical measurements within normal limits will improve Outcome: Progressing Goal: Will remain free from infection Outcome: Progressing Goal: Diagnostic test results will improve Outcome: Progressing Goal: Respiratory complications will improve Outcome: Progressing Goal: Cardiovascular complication will be avoided Outcome: Progressing   Problem: Activity: Goal: Risk for activity intolerance will decrease Outcome: Progressing   Problem: Nutrition: Goal: Adequate nutrition will be maintained Outcome: Progressing   Problem: Coping: Goal: Level of anxiety will decrease Outcome: Progressing   Problem: Elimination: Goal: Will not experience complications related to bowel motility Outcome: Progressing Goal: Will not experience complications related to urinary retention Outcome: Progressing   Problem: Pain Managment: Goal: General experience of comfort will improve Outcome: Progressing   Problem: Safety: Goal: Ability to remain free from injury will improve Outcome: Progressing   Problem: Skin Integrity: Goal: Risk for impaired skin integrity will decrease Outcome: Progressing   Problem: Education: Goal: Ability to demonstrate management of disease process will improve Outcome: Progressing Goal: Ability to verbalize understanding of medication therapies will improve Outcome:  Progressing Goal: Individualized Educational Video(s) Outcome: Progressing   Problem: Activity: Goal: Capacity to carry out activities will improve Outcome: Progressing   Problem: Cardiac: Goal: Ability to achieve and maintain adequate cardiopulmonary perfusion will improve Outcome: Progressing

## 2019-11-07 NOTE — Progress Notes (Signed)
*  PRELIMINARY RESULTS* Echocardiogram 2D Echocardiogram has been performed.  Cheryl Mercer 11/07/2019, 7:39 PM

## 2019-11-07 NOTE — ED Notes (Signed)
Receiving unit notified of incoming patient.

## 2019-11-07 NOTE — H&P (Signed)
History and Physical    Cheryl Mercer IWL:[id number] DOB: Nov 29, 1985 DOA: 11/07/2019  PCP: Lorelee Market, MD   Patient coming from: home  I have personally briefly reviewed patient's old medical records in [place]  Chief Complaint: shortness of breath and leg swelling  HPI: Cheryl Mercer is a 34 y.o. female with medical history significant for chronic combined systolic and diastolic CHF, last EF 10 to 15%, with poor outpatient follow-up, diabetes mellitus, hypertension, hyperlipidemia and history of hospitalization for COVID-19 pneumonia on 09/06/2019, who presents to the emergency room with a several day history of shortness of breath, lower extremity edema and orthopnea.  She denies chest pain, nausea vomiting or diaphoresis.  Has been out of his Lasix for two weeks. Says she is compliant with her other meds  ED Course: In the emergency room she was tachycardic at 126, tachypneic at 26, BP 139/105 and afebrile with O2 sat 96% on room air.  First troponin 43, BNP pending.  EKG with no acute ST-T wave changes, chest x-ray showed mild perihilar congestion and cardiomegaly, no overt edema.  Blood work otherwise unremarkable except for low sodium of 132.  Patient was given a dose of Lasix.  Hospitalist consulted for admission  Review of Systems: As per HPI otherwise 10 point review of systems negative.    Past Medical History:  Diagnosis Date  . Chronic back pain   . Diabetes mellitus without complication ([place])   . Migraines   . No pertinent past medical history   . Pregnancy induced hypertension     Past Surgical History:  Procedure Laterality Date  . CERVICAL BIOPSY  W/ LOOP ELECTRODE EXCISION  2009  . Laparoscopic tubal sterilization with Falope rings.  2009  . RIGHT/LEFT HEART CATH AND CORONARY ANGIOGRAPHY N/A 03/25/2019   Procedure: RIGHT/LEFT HEART CATH AND CORONARY ANGIOGRAPHY;  Surgeon: Jolaine Artist, MD;  Location: [facility];   Service: Cardiovascular;  Laterality: N/A;  . TUBAL LIGATION       reports that she has never smoked. She has never used smokeless tobacco. She reports that she does not drink alcohol or use drugs.  Allergies  Allergen Reactions  . Ibuprofen Anaphylaxis    Family History  Problem Relation Age of Onset  . Hypertension Mother   . Anesthesia problems Neg Hx      Prior to Admission medications   Medication Sig Start Date End Date Taking? Authorizing Provider  atorvastatin (LIPITOR) 40 MG tablet Take 1 tablet (40 mg total) by mouth daily at 6 PM. 09/08/19   Max Sane, MD  blood glucose meter kit and supplies Dispense based on patient and insurance preference. Check blood sugar 4 times daily, three times before meals and once before bedtime. (FOR ICD-10 E10.9, E11.9). 09/08/19   Max Sane, MD  carvedilol (COREG) 6.25 MG tablet Take 1 tablet (6.25 mg total) by mouth 2 (two) times daily with a meal. 09/08/19   Max Sane, MD  cyclobenzaprine (FLEXERIL) 10 MG tablet Take 10 mg by mouth 3 (three) times daily as needed for muscle spasms. 04/07/19   [provider]  digoxin (LANOXIN) 0.125 MG tablet Take 1 tablet (0.125 mg total) by mouth daily. 09/08/19   Max Sane, MD  empagliflozin (JARDIANCE) 10 MG TABS tablet Take 10 mg by mouth daily. 09/08/19   Max Sane, MD  gabapentin (NEURONTIN) 300 MG capsule Take 1 capsule (300 mg total) by mouth 3 (three) times daily. 09/08/19   Max Sane, MD  insulin aspart (NOVOLOG FLEXPEN) 100 UNIT/ML FlexPen Inject 10 Units into the skin 3 (three) times daily with meals. 09/08/19   Max Sane, MD  Insulin Glargine (LANTUS) 100 UNIT/ML Solostar Pen Inject 40 Units into the skin daily. 09/08/19   Max Sane, MD  metFORMIN (GLUCOPHAGE) 500 MG tablet Take 1 tablet (500 mg total) by mouth 2 (two) times daily with a meal. 09/08/19   Max Sane, MD  PARoxetine (PAXIL) 10 MG tablet Take 1 tablet (10 mg total) by mouth daily. 09/08/19   Max Sane, MD   rizatriptan (MAXALT) 10 MG tablet Take 1 tablet (10 mg total) by mouth as needed for migraine. May repeat in 2 hours if needed 09/08/19   Max Sane, MD  sacubitril-valsartan (ENTRESTO) 24-26 MG Take 1 tablet by mouth 2 (two) times daily. 09/08/19   Max Sane, MD  spironolactone (ALDACTONE) 25 MG tablet Take 1 tablet (25 mg total) by mouth daily. 09/08/19   Max Sane, MD    Physical Exam: Vitals:   11/07/19 0258  BP: (!) 139/105  Pulse: (!) 128  Resp: (!) 26  Temp: 99 F (37.2 C)  TempSrc: Oral  SpO2: 96%  Weight: 73.9 kg  Height: 5' 1"  (1.549 m)     Vitals:   11/07/19 0258  BP: (!) 139/105  Pulse: (!) 128  Resp: (!) 26  Temp: 99 F (37.2 C)  TempSrc: Oral  SpO2: 96%  Weight: 73.9 kg  Height: 5' 1"  (1.549 m)    Constitutional: Alert and awake, oriented x3, not in any acute distress.  Mild conversational dyspnea.  Patient sitting upright in bed Eyes: PERLA, EOMI, irises appear normal, anicteric sclera,  ENMT: external ears and nose appear normal, normal hearing             Lips appears normal, oropharynx mucosa, tongue, posterior pharynx appear normal  Neck: neck appears normal, no masses, normal ROM, no thyromegaly, no JVD  CVS: S1-S2 clear, no murmur rubs or gallops,  , no carotid bruits, pedal pulses palpable, 1+ LE edema Respiratory: Diminished bilaterally at bases, few bibasilar rales, no wheezing or rhonchi. Respiratory effort normal slightly increased. No accessory muscle use.  Abdomen: soft nontender, nondistended, normal bowel sounds, no hepatosplenomegaly, no hernias Musculoskeletal: : no cyanosis, clubbing , no contractures or atrophy Neuro: Cranial nerves II-XII intact, sensation, reflexes normal, strength Psych: judgement and insight appear normal, stable mood and affect,  Skin: no rashes or lesions or ulcers, no induration or nodules   Labs on Admission: I have personally reviewed following labs and imaging studies  CBC: Recent Labs  Lab  11/07/19 0307  WBC 10.7*  HGB 12.5  HCT 37.8  MCV 89.4  PLT 219   Basic Metabolic Panel: Recent Labs  Lab 11/07/19 0307  NA 132*  K 3.9  CL 97*  CO2 26  GLUCOSE 490*  BUN 9  CREATININE 0.55  CALCIUM 8.6*   GFR: Estimated Creatinine Clearance: 92.8 mL/min (by C-G formula based on SCr of 0.55 mg/dL). Liver Function Tests: Recent Labs  Lab 11/07/19 0307  AST 36  ALT 15  ALKPHOS 126  BILITOT 0.6  PROT 6.8  ALBUMIN 2.8*   No results for input(s): LIPASE, AMYLASE in the last 168 hours. No results for input(s): AMMONIA in the last 168 hours. Coagulation Profile: No results for input(s): INR, PROTIME in the last 168 hours. Cardiac Enzymes: No results for input(s): CKTOTAL, CKMB, CKMBINDEX, TROPONINI in the last 168 hours. BNP (last 3 results) No results for  input(s): PROBNP in the last 8760 hours. HbA1C: No results for input(s): HGBA1C in the last 72 hours. CBG: No results for input(s): GLUCAP in the last 168 hours. Lipid Profile: No results for input(s): CHOL, HDL, LDLCALC, TRIG, CHOLHDL, LDLDIRECT in the last 72 hours. Thyroid Function Tests: No results for input(s): TSH, T4TOTAL, FREET4, T3FREE, THYROIDAB in the last 72 hours. Anemia Panel: No results for input(s): VITAMINB12, FOLATE, FERRITIN, TIBC, IRON, RETICCTPCT in the last 72 hours. Urine analysis:    Component Value Date/Time   COLORURINE YELLOW (A) 09/06/2019 2012   APPEARANCEUR CLEAR (A) 09/06/2019 2012   LABSPEC 1.030 09/06/2019 2012   PHURINE 7.0 09/06/2019 2012   GLUCOSEU >=500 (A) 09/06/2019 2012   HGBUR SMALL (A) 09/06/2019 2012   BILIRUBINUR NEGATIVE 09/06/2019 2012   [place] NEGATIVE 09/06/2019 2012   PROTEINUR 100 (A) 09/06/2019 2012   UROBILINOGEN 0.2 02/04/2019 1720   NITRITE NEGATIVE 09/06/2019 2012   LEUKOCYTESUR NEGATIVE 09/06/2019 2012    Radiological Exams on Admission: DG Chest 1 View  Result Date: 11/07/2019 CLINICAL DATA:  Initial evaluation for acute shortness of breath.  EXAM: CHEST  1 VIEW COMPARISON:  Prior radiograph from 09/10/2019. FINDINGS: Cardiomegaly, stable.  Mediastinal silhouette within normal limits. Lungs normally inflated. Perihilar vascular congestion without overt pulmonary edema. No pleural effusion. No consolidative airspace disease. No pneumothorax. No acute osseous finding. IMPRESSION: 1. Cardiomegaly with mild perihilar vascular congestion without overt pulmonary edema. 2. No other active cardiopulmonary disease. Electronically Signed   By: Jeannine Boga M.D.   On: 11/07/2019 03:36    EKG: Independently reviewed.  Sinus tachycardia with nonspecific ST-T wave changes  Assessment/Plan    Acute on chronic combined systolic and diastolic CHF (congestive heart failure) (HCC) -Last EF 10 to 15% --BNP resulted at 965 -Patient with poor compliance with medication and follow-up for social/financial reasons -Lasix 40 mg IV twice daily -Patient is on Coreg, digoxin, Entresto as well as spironolactone but compliance uncertain -Resume Coreg digoxin and spironolactone. resume Entresto once med rec shows compliance -Strict intake and output monitoring and daily weights as well as salt restriction -Repeat echocardiogram in the a.m. -Cycle troponins to evaluate for ACS -Cardiology consult recommendations    Elevated troponin -Suspect related to demand ischemia from heart failure exacerbation.  Patient has no chest pain and EKG is nonacute -Continue to cycle enzymes -Daily aspirin    Uncontrolled type 2 diabetes mellitus with hyperglycemia (HCC) -Blood sugar was 490, suspect in part related to education compliance -Sliding scale insulin coverage    Essential hypertension -Blood pressure on arrival 139/105 -Should improve with restart of home meds -Continue to monitor    History of COVID-19 -No acute concerns at this time    DVT prophylaxis: Lovenox  Code Status: full code  Family Communication:  none  Disposition Plan: Back to  previous home environment Consults called: Cardiology, Dr. Clayborn Bigness Status:inp    Athena Masse MD [facility]     11/07/2019, 4:08 AM

## 2019-11-07 NOTE — Consult Note (Signed)
Cardiology Consultation:   Patient ID: Cheryl Mercer; [id number]; Oct 07, 1985   Admit date: 11/07/2019 Date of Consult: 11/07/2019  Primary Care Provider: Lorelee Market, MD Primary Cardiologist: [place]   Patient Profile:   Cheryl Mercer is a 34 y.o. female with a hx of HFrEF secondary to NICM, poorly controlled IDDM, recent COVID-36 PNA in 08/2019, HTN, migraines and chronic back pain who is being seen today for the evaluation of CHF at the request of Dr. Damita Dunnings.  History of Present Illness:   Cheryl Mercer was admitted to the hospital in 03/2019 with SOB and diagnosed with systolic CHF at that time with echo showing an EF of 10-15% and a moderately reduced RVSF. BNP of 1418 and an A1c > 13. R/LHC showed no CAD with well compensated hemodynamics with low filling pressures. Cardiac MRI showed an EF of 15% with no significant LGE. She has been maintained on GDMT with Entresto, Coreg, spironolactone, Jariance, and digoxin. She is followed by the paramedicine service. Her weight at her last CHF visit in 04/2019 was noted to be 64.9 kg. She was euvolemic.   She was recently admitted to the hospital in 08/2019 with COVID-19 PNA. She was not treated with remdesivir or steroids.   Over the past 1 week prior to her presentation, she had noted worsening dyspnea, orthopnea, and lower extremity swelling. She reports compliance with her medications dating back to early 09/2019 (prior to that she had missed some doses secondary to finances). She denies any changes to her diet. At baseline, she has 2-pillow orthopnea which has increased some over the past week. She is uncertain what her weight has been at home secondary to her scale being broken. At baseline, she has not required scheduled loop diuretic. No fevers, chills, chest pain, dizziness, presyncope, or syncope.   Upon the patient's arrival to [facility] they were found to have BP ranging from the 155M to 080E systolic, HR ranging  from the 1-teens to 120s bpm, temp afebrile, oxygen saturation 96% on room air, weight documented in the ED at 73.9 kg. EKG showed sinus tachycardia with nonspecific st/t changes changes as below, CXR showed cardiomegaly with mild perihilar vascular congestion. Labs showed HS-Tn 43 with a delta of 40, BNP 965, HCG negative, COVID-19 negative, sodium 132, potassium 3.9, BUN 9, SCr 0.55, glucose 490, albumin 2.8, AST/ALT normal, WBC 10.7, HGB 12.5, PLT 330. In the ED, she was given IV Lasix 40 mg x 1 with no documented UOP to date. Upon admission, she was continued on PTA cardiac medications and cardiology was consulted. She notes significant improvement in her dyspnea and lower extremity swelling since her arrival to the ED with IV Lasix as above.   Past Medical History:  Diagnosis Date  . Chronic back pain   . Diabetes mellitus without complication ([place])   . Migraines   . No pertinent past medical history   . Pregnancy induced hypertension     Past Surgical History:  Procedure Laterality Date  . CERVICAL BIOPSY  W/ LOOP ELECTRODE EXCISION  2009  . Laparoscopic tubal sterilization with Falope rings.  2009  . RIGHT/LEFT HEART CATH AND CORONARY ANGIOGRAPHY N/A 03/25/2019   Procedure: RIGHT/LEFT HEART CATH AND CORONARY ANGIOGRAPHY;  Surgeon: Jolaine Artist, MD;  Location: [facility];  Service: Cardiovascular;  Laterality: N/A;  . TUBAL LIGATION       Home Meds: Prior to Admission medications   Medication Sig Start Date End Date Taking? Authorizing Provider  atorvastatin (LIPITOR) 40 MG tablet Take 1 tablet (40 mg total) by mouth daily at 6 PM. 09/08/19  Yes Max Sane, MD  carvedilol (COREG) 6.25 MG tablet Take 1 tablet (6.25 mg total) by mouth 2 (two) times daily with a meal. 09/08/19  Yes Max Sane, MD  cyclobenzaprine (FLEXERIL) 10 MG tablet Take 10 mg by mouth 3 (three) times daily as needed for muscle spasms. 04/07/19  Yes [provider]  digoxin (LANOXIN) 0.125 MG  tablet Take 1 tablet (0.125 mg total) by mouth daily. 09/08/19  Yes Max Sane, MD  empagliflozin (JARDIANCE) 10 MG TABS tablet Take 10 mg by mouth daily. 09/08/19  Yes Max Sane, MD  gabapentin (NEURONTIN) 300 MG capsule Take 1 capsule (300 mg total) by mouth 3 (three) times daily. 09/08/19  Yes Max Sane, MD  insulin aspart (NOVOLOG FLEXPEN) 100 UNIT/ML FlexPen Inject 10 Units into the skin 3 (three) times daily with meals. 09/08/19  Yes Max Sane, MD  Insulin Glargine (LANTUS) 100 UNIT/ML Solostar Pen Inject 40 Units into the skin daily. 09/08/19  Yes Max Sane, MD  metFORMIN (GLUCOPHAGE) 500 MG tablet Take 1 tablet (500 mg total) by mouth 2 (two) times daily with a meal. 09/08/19  Yes Max Sane, MD  PARoxetine (PAXIL) 10 MG tablet Take 1 tablet (10 mg total) by mouth daily. 09/08/19  Yes Max Sane, MD  rizatriptan (MAXALT) 10 MG tablet Take 1 tablet (10 mg total) by mouth as needed for migraine. May repeat in 2 hours if needed 09/08/19  Yes Max Sane, MD  sacubitril-valsartan (ENTRESTO) 24-26 MG Take 1 tablet by mouth 2 (two) times daily. 09/08/19  Yes Max Sane, MD  spironolactone (ALDACTONE) 25 MG tablet Take 1 tablet (25 mg total) by mouth daily. 09/08/19  Yes Max Sane, MD  blood glucose meter kit and supplies Dispense based on patient and insurance preference. Check blood sugar 4 times daily, three times before meals and once before bedtime. (FOR ICD-10 E10.9, E11.9). 09/08/19   Max Sane, MD    Inpatient Medications: Scheduled Meds: . carvedilol  6.25 mg Oral BID WC  . digoxin  0.125 mg Oral Daily  . enoxaparin (LOVENOX) injection  40 mg Subcutaneous Q24H  . furosemide  40 mg Intravenous Q12H  . insulin aspart  0-15 Units Subcutaneous TID WC  . insulin aspart  0-5 Units Subcutaneous QHS  . [START ON 11/08/2019] pneumococcal 23 valent vaccine  0.5 mL Intramuscular Tomorrow-1000  . sacubitril-valsartan  1 tablet Oral BID  . sodium chloride flush  3 mL Intravenous Q12H  .  spironolactone  25 mg Oral Daily   Continuous Infusions: . sodium chloride     PRN Meds: sodium chloride, acetaminophen, ondansetron (ZOFRAN) IV, sodium chloride flush, SUMAtriptan  Allergies:   Allergies  Allergen Reactions  . Ibuprofen Anaphylaxis    Social History:   Social History   Socioeconomic History  . Marital status: Married    Spouse name: Not on file  . Number of children: 4  . Years of education: Not on file  . Highest education level: Not on file  Occupational History  . Not on file  Tobacco Use  . Smoking status: Never Smoker  . Smokeless tobacco: Never Used  Substance and Sexual Activity  . Alcohol use: No    Alcohol/week: 6.0 standard drinks    Types: 6 Shots of liquor per week  . Drug use: No    Types: Marijuana  . Sexual activity: Not Currently    Birth  control/protection: Surgical    Comment: band placed around tubes in 2009 surgically by Dr. Glo Herring  Other Topics Concern  . Not on file  Social History Narrative  . Not on file   Social Determinants of Health   Financial Resource Strain: Medium Risk  . Difficulty of Paying Living Expenses: Somewhat hard  Food Insecurity: Food Insecurity Present  . Worried About Charity fundraiser in the Last Year: Sometimes true  . Ran Out of Food in the Last Year: Never true  Transportation Needs: No Transportation Needs  . Lack of Transportation (Medical): No  . Lack of Transportation (Non-Medical): No  Physical Activity:   . Days of Exercise per Week:   . Minutes of Exercise per Session:   Stress:   . Feeling of Stress :   Social Connections:   . Frequency of Communication with Friends and Family:   . Frequency of Social Gatherings with Friends and Family:   . Attends Religious Services:   . Active Member of Clubs or Organizations:   . Attends Archivist Meetings:   Marland Kitchen Marital Status:   Intimate Partner Violence:   . Fear of Current or Ex-Partner:   . Emotionally Abused:   Marland Kitchen  Physically Abused:   . Sexually Abused:      Family History:   Family History  Problem Relation Age of Onset  . Hypertension Mother   . Anesthesia problems Neg Hx     ROS:  Review of Systems  Constitutional: Positive for malaise/fatigue. Negative for chills, diaphoresis, fever and weight loss.  HENT: Negative for congestion.   Eyes: Negative for discharge and redness.  Respiratory: Positive for shortness of breath. Negative for cough, sputum production and wheezing.   Cardiovascular: Positive for orthopnea and leg swelling. Negative for chest pain, palpitations, claudication and PND.  Gastrointestinal: Negative for abdominal pain, heartburn, nausea and vomiting.  Musculoskeletal: Negative for falls and myalgias.  Skin: Negative for rash.  Neurological: Positive for weakness. Negative for dizziness, tingling, tremors, sensory change, speech change, focal weakness and loss of consciousness.  Psychiatric/Behavioral: Negative for substance abuse. The patient is not nervous/anxious.   All other systems reviewed and are negative.     Physical Exam/Data:   Vitals:   11/07/19 0430 11/07/19 0447 11/07/19 0620 11/07/19 0728  BP: (!) 151/120 (!) 153/116 (!) 154/117 (!) 166/123  Pulse: (!) 121 (!) 121 (!) 116 (!) 115  Resp: _0 Temp:   98.2 F (36.8 C) 98.1 F (36.7 C)  TempSrc:   Oral Oral  SpO2: 95% 99% 99% 98%  Weight:      Height:       No intake or output data in the 24 hours ending 11/07/19 0849 Filed Weights   11/07/19 0258  Weight: 73.9 kg   Body mass index is 30.8 kg/m.   Physical Exam: General: Well developed, well nourished, in no acute distress. Head: Normocephalic, atraumatic, sclera non-icteric, no xanthomas, nares without discharge.  Neck: Negative for carotid bruits. JVD not elevated. Lungs: Clear bilaterally to auscultation without wheezes, rales, or rhonchi. Breathing is unlabored. Heart: Mildly tachycardic with S1 S2. No murmurs, rubs, or gallops  appreciated. Abdomen: Soft, non-tender, non-distended with normoactive bowel sounds. No hepatomegaly. No rebound/guarding. No obvious abdominal masses. Msk:  Strength and tone appear normal for age. Extremities: No clubbing or cyanosis. Trace bilateral pretibial edema. Distal pedal pulses are 2+ and equal bilaterally. Neuro: Alert and oriented X 3. No facial asymmetry. No focal deficit.  Moves all extremities spontaneously. Psych:  Responds to questions appropriately with a normal affect.   EKG:  The EKG was personally reviewed and demonstrates: sinus tachycardia, 112 bpm, nonspecific st/t changes Telemetry:  Telemetry was personally reviewed and demonstrates: sinus tachycardia, low 100s to 110s bpm  Weights: Filed Weights   11/07/19 0258  Weight: 73.9 kg    Relevant CV Studies: [facility] 03/25/2019: Findings:  Ao = 122/88 (104) LV = 125/15 RA = 2 RV = 35/6 PA = 37/14 (25) PCW = 18 Fick cardiac output/index = 5.3/3.2 SVR = 1543 PVR = 1.3 WU Ao sat = 99% PA sat = 74%, 76%  Assessment: 1. Normal coronary arteries 2. 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 Well-compensated hemodynamics  Plan/Discussion:  Medical management of probable HTN cardiomyopathy. Check cMRI.  _______________  Cardiac MRI 03/25/2019: IMPRESSION: 1. Severely reduced left ventricular systolic function with a visually estimated LVEF = 15%. Severe global hypokinesis.  2. Nonspecific, mild, mid-myocardial stripe of delayed enhancement in the basal ventricular septum. No definite findings to suggest myocarditis.  3. Patchy delayed enhancement of the pericardium with a trivial to small circumferential pericardial effusion, suggesting possible Pericarditis. _______________  2D echo 03/23/2019: 1. The left ventricle has a visually estimated ejection fraction of  10-15%%. The cavity size was severely dilated. Left ventricular diastolic  Doppler parameters are consistent with restrictive filling.  2. The right  ventricle has moderately reduced systolic function. The  cavity was normal. There is no increase in right ventricular wall  thickness. Right ventricular systolic pressure is mildly elevated with an  estimated pressure of 33.7 mmHg.  3. Left atrial size was moderately dilated.  4. Right atrial size was mildly dilated.  5. No evidence of mitral valve stenosis.  6. No stenosis of the aortic valve.  7. The aorta is normal in size and structure.  8. The aortic root and ascending aorta are normal in size and structure.  9. The inferior vena cava was dilated in size with <50% respiratory  variability.   Laboratory Data:  Chemistry Recent Labs  Lab 11/07/19 0307  NA 132*  K 3.9  CL 97*  CO2 26  GLUCOSE 490*  BUN 9  CREATININE 0.55  CALCIUM 8.6*  GFRNONAA >60  GFRAA >60  ANIONGAP 9    Recent Labs  Lab 11/07/19 0307  PROT 6.8  ALBUMIN 2.8*  AST 36  ALT 15  ALKPHOS 126  BILITOT 0.6   Hematology Recent Labs  Lab 11/07/19 0307  WBC 10.7*  RBC 4.23  HGB 12.5  HCT 37.8  MCV 89.4  MCH 29.6  MCHC 33.1  RDW 13.2  PLT 330   Cardiac EnzymesNo results for input(s): TROPONINI in the last 168 hours. No results for input(s): TROPIPOC in the last 168 hours.  BNP Recent Labs  Lab 11/07/19 0307  BNP 965.0*    DDimer No results for input(s): DDIMER in the last 168 hours.  Radiology/Studies:  DG Chest 1 View  Result Date: 11/07/2019 IMPRESSION: 1. Cardiomegaly with mild perihilar vascular congestion without overt pulmonary edema. 2. No other active cardiopulmonary disease. Electronically Signed   By: Jeannine Boga M.D.   On: 11/07/2019 03:36    Assessment and Plan:   1. Acute on chronic HFrEF secondary to NICM: -She does remain volume up -Previously not requiring scheduled diuretic therapy as an outpatient -For now, IV Lasix 40 mg daily is reasonable to start with, followed by consideration for escalation based on UOP and renal function -Continue GDMT  including Entresto, Coreg, spironolactone and digoxin -Resume Jardiance at discharge  -Sinus tachycardia in compensatory secondary to her HF, as her symptoms improve, would look to titrate Coreg prior to discharge as able -As an outpatient, consider ivabradine  -Check digoxin level -Echo pending -CHF education -Daily weights -Strict I/O -If her EF remains < 35% despite optimization of medical therapy she will need to be referred to EP as an outpatient for consideration of ICD  2. Elevated troponin: -Minimally elevated and not consistent with ACS -Recent LHC with normal coronary arteries  -No indication for heparin gtt at this time -No need for further HS-Tn cycling -ASA  3. HTN: -BP has ranged from the 902X to 115Z systolic this admission -Continue medications as above  4. Recent COVID-19 PNA: -Echo as above  5. Poorly controlled IDDM: -Per IM   For questions or updates, please contact [place] Please consult [email or web address] for contact info under Cardiology/STEMI.   Signed, Christell Faith, PA-C [facility] Pager: [phone] 11/07/2019, 8:49 AM

## 2019-11-08 LAB — BASIC METABOLIC PANEL
Anion gap: 8 (ref 5–15)
BUN: 13 mg/dL (ref 6–20)
CO2: 29 mmol/L (ref 22–32)
Calcium: 7.9 mg/dL — ABNORMAL LOW (ref 8.9–10.3)
Chloride: 97 mmol/L — ABNORMAL LOW (ref 98–111)
Creatinine, Ser: 0.49 mg/dL (ref 0.44–1.00)
GFR calc Af Amer: >60 mL/min (ref >60)
GFR calc non Af Amer: >60 mL/min (ref >60)
Glucose, Bld: 299 mg/dL — ABNORMAL HIGH (ref 70–99)
Potassium: 3 mmol/L — ABNORMAL LOW (ref 3.5–5.1)
Sodium: 134 mmol/L — ABNORMAL LOW (ref 135–145)

## 2019-11-08 LAB — CBC
HCT: 41.4 % (ref 36.0–46.0)
Hemoglobin: 14 g/dL (ref 12.0–15.0)
MCH: 29.6 pg (ref 26.0–34.0)
MCHC: 33.8 g/dL (ref 30.0–36.0)
MCV: 87.5 fL (ref 80.0–100.0)
Platelets: 391 10*3/uL (ref 150–400)
RBC: 4.73 MIL/uL (ref 3.87–5.11)
RDW: 13.2 % (ref 11.5–15.5)
WBC: 9.2 10*3/uL (ref 4.0–10.5)
nRBC: 0 % (ref 0.0–0.2)

## 2019-11-08 LAB — GLUCOSE, CAPILLARY
Glucose-Capillary: 237 mg/dL — ABNORMAL HIGH (ref 70–99)
Glucose-Capillary: 127 mg/dL — ABNORMAL HIGH (ref 70–99)
Glucose-Capillary: 153 mg/dL — ABNORMAL HIGH (ref 70–99)
Glucose-Capillary: 220 mg/dL — ABNORMAL HIGH (ref 70–99)

## 2019-11-08 LAB — ECHOCARDIOGRAM COMPLETE
Height: 61 in
Weight: 2608 oz

## 2019-11-08 LAB — MAGNESIUM: Magnesium: 1.5 mg/dL — ABNORMAL LOW (ref 1.7–2.4)

## 2019-11-08 MED ORDER — FUROSEMIDE 10 MG/ML IJ SOLN
40.0000 mg | Freq: Two times a day (BID) | INTRAMUSCULAR | Status: DC
  Administered 2019-11-08 – 2019-11-10 (×5): 40 mg via INTRAVENOUS
  Filled 2019-11-08 (×5): qty 4

## 2019-11-08 MED ORDER — MAGNESIUM SULFATE 2 GM/50ML IV SOLN
2.0000 g | Freq: Once | INTRAVENOUS | Status: AC
  Administered 2019-11-08: 2 g via INTRAVENOUS
  Filled 2019-11-08: qty 50

## 2019-11-08 MED ORDER — FUROSEMIDE 20 MG PO TABS
20.0000 mg | ORAL_TABLET | Freq: Every day | ORAL | Status: DC
  Administered 2019-11-08: 20 mg via ORAL
  Filled 2019-11-08: qty 1

## 2019-11-08 MED ORDER — POTASSIUM CHLORIDE CRYS ER 20 MEQ PO TBCR
30.0000 meq | EXTENDED_RELEASE_TABLET | Freq: Two times a day (BID) | ORAL | Status: AC
  Administered 2019-11-08 (×2): 30 meq via ORAL
  Filled 2019-11-08 (×2): qty 1

## 2019-11-08 MED ORDER — SACUBITRIL-VALSARTAN 24-26 MG PO TABS
1.0000 | ORAL_TABLET | Freq: Two times a day (BID) | ORAL | Status: DC
  Administered 2019-11-08 – 2019-11-10 (×4): 1 via ORAL
  Filled 2019-11-08 (×4): qty 1

## 2019-11-08 NOTE — Progress Notes (Signed)
PROGRESS NOTE    Cheryl Mercer  KDX:[id number] DOB: [birth year] DOA: 11/07/2019 PCP: Evelene Croon, MD    Assessment & Plan:   Principal Problem:   Acute on chronic combined systolic and diastolic CHF (congestive heart failure) (HCC) Active Problems:   Uncontrolled type 2 diabetes mellitus with hyperglycemia (HCC)   Essential hypertension   History of COVID-19   Acute on chronic combined systolic (congestive) and diastolic (congestive) heart failure (HCC)   Elevated troponin    Cheryl Mercer is a 34 y.o. female with medical history significant for chronic combined systolic and diastolic CHF, last EF 10 to 15%, with poor outpatient follow-up, diabetes mellitus, hypertension, hyperlipidemia and history of hospitalization for COVID-19 pneumonia on 09/06/2019, who presents to the emergency room with a several day history of shortness of breath, lower extremity edema and orthopnea.     Acute on chronic combined systolic and diastolic CHF exacerbation # Nonischemic cardiomyopathy -Last EF 10 to 15%.  TTE during current admission showed LVEF 20-25%. --BNP resulted at 965 -Patient with poor compliance with medication and follow-up for social/financial reasons --diuresed well with IV Lasix 40 mg with improvement in symptoms PLAN: --continue IV Lasix 40 mg BID since breathing not back to baseline and Cr completely stable -continue Coreg, digoxin, Entresto, spironolactone  -Strict intake and output monitoring and daily weights as well as salt restriction    Elevated troponin -Suspect related to demand ischemia from heart failure exacerbation.  Patient has no chest pain and EKG is nonacute -trop 40's flat -Daily aspirin    Uncontrolled type 2 diabetes mellitus with hyperglycemia (HCC) -A1c 13.8.  Pt said she hadn't given herself insulin for at least 2 months because she couldn't afford the needles for the insulin pens. PLAN: --Lantus 40u daily --Meal-time 10u  TID -SSI TID --Order needles at discharge with instruction for pharm to supply whatever pt's insurance covers    Essential hypertension -Blood pressure on arrival 139/105 -continue Coreg, Lasix, Entresto as well as spironolactone      History of COVID-19 -No acute concerns at this time   DVT prophylaxis: Lovenox SQ Code Status: Full code  Family Communication: Daughter updated at bedside today Disposition Plan: Home likely in 2 days.  Currently still on IV lasix, if breathing back to baseline tomorrow, then can consider transitioning to PO lasix and discharge the next day.   Subjective and Interval History:  Pt reported dyspnea improved, but breathing not quite back to baseline.  Urine output has decreased.  No fever, chest pain, abdominal pain, N/V/D, dysuria, increased swelling.   Objective: Vitals:   11/08/19 0438 11/08/19 0748 11/08/19 1109 11/08/19 1554  BP: 108/75 111/88 (!) 102/56 110/72  Pulse: 88 88 89 96  Resp: 18 18 19 20   Temp: (!) 97.3 F (36.3 C) 98.2 F (36.8 C)    TempSrc: Oral     SpO2: 99% 99% 99% 100%  Weight:      Height:        Intake/Output Summary (Last 24 hours) at 11/08/2019 1923 Last data filed at 11/08/2019 1853 Gross per 24 hour  Intake 480 ml  Output 2000 ml  Net -1520 ml   Filed Weights   11/07/19 0258 11/08/19 0351  Weight: 73.9 kg 65 kg    Examination:   Constitutional: NAD, AAOx3 HEENT: conjunctivae and lids normal, EOMI CV: RRR no M,R,G. Distal pulses +2.  No cyanosis.   RESP: CTA B/L, normal respiratory effort  GI: +BS, NTND Extremities: No effusions, edema,  or tenderness in BLE MSK: no joint enlargement or tenderness of both UE and LE SKIN: warm, dry and intact Neuro: II - XII grossly intact.  Sensation intact Psych: Normal mood and affect.  Appropriate judgement and reason   Data Reviewed: I have personally reviewed following labs and imaging studies  CBC: Recent Labs  Lab 11/07/19 0307 11/08/19 0542  WBC 10.7*  9.2  HGB 12.5 14.0  HCT 37.8 41.4  MCV 89.4 87.5  PLT 330 391   Basic Metabolic Panel: Recent Labs  Lab 11/07/19 0307 11/08/19 0542  NA 132* 134*  K 3.9 3.0*  CL 97* 97*  CO2 26 29  GLUCOSE 490* 299*  BUN 9 13  CREATININE 0.55 0.49  CALCIUM 8.6* 7.9*  MG  --  1.5*   GFR: Estimated Creatinine Clearance: 87.2 mL/min (by C-G formula based on SCr of 0.49 mg/dL). Liver Function Tests: Recent Labs  Lab 11/07/19 0307  AST 36  ALT 15  ALKPHOS 126  BILITOT 0.6  PROT 6.8  ALBUMIN 2.8*   No results for input(s): LIPASE, AMYLASE in the last 168 hours. No results for input(s): AMMONIA in the last 168 hours. Coagulation Profile: No results for input(s): INR, PROTIME in the last 168 hours. Cardiac Enzymes: No results for input(s): CKTOTAL, CKMB, CKMBINDEX, TROPONINI in the last 168 hours. BNP (last 3 results) No results for input(s): PROBNP in the last 8760 hours. HbA1C: Recent Labs    11/07/19 0728  HGBA1C 13.8*   CBG: Recent Labs  Lab 11/07/19 1632 11/07/19 2221 11/08/19 0752 11/08/19 1154 11/08/19 1659  GLUCAP 99 360* 237* 127* 153*   Lipid Profile: No results for input(s): CHOL, HDL, LDLCALC, TRIG, CHOLHDL, LDLDIRECT in the last 72 hours. Thyroid Function Tests: No results for input(s): TSH, T4TOTAL, FREET4, T3FREE, THYROIDAB in the last 72 hours. Anemia Panel: No results for input(s): VITAMINB12, FOLATE, FERRITIN, TIBC, IRON, RETICCTPCT in the last 72 hours. Sepsis Labs: No results for input(s): PROCALCITON, LATICACIDVEN in the last 168 hours.  No results found for this or any previous visit (from the past 240 hour(s)).    Radiology Studies: DG Chest 1 View  Result Date: 11/07/2019 CLINICAL DATA:  Initial evaluation for acute shortness of breath. EXAM: CHEST  1 VIEW COMPARISON:  Prior radiograph from 09/10/2019. FINDINGS: Cardiomegaly, stable.  Mediastinal silhouette within normal limits. Lungs normally inflated. Perihilar vascular congestion without  overt pulmonary edema. No pleural effusion. No consolidative airspace disease. No pneumothorax. No acute osseous finding. IMPRESSION: 1. Cardiomegaly with mild perihilar vascular congestion without overt pulmonary edema. 2. No other active cardiopulmonary disease. Electronically Signed   By: Rise Mu M.D.   On: 11/07/2019 03:36   ECHOCARDIOGRAM COMPLETE  Result Date: 11/08/2019    ECHOCARDIOGRAM REPORT   Patient Name:   LEAHNA HEWSON Date of Exam: 11/07/2019 Medical Rec #:  [id number]              Height:       61.0 in Accession #:    [id number]             Weight:       163.0 lb Date of Birth:  10-17-1985             BSA:          1.731 m Patient Age:    33 years               BP:           125/85  mmHg Patient Gender: F                      HR:           95 bpm. Exam Location:  [facility] Procedure: 2D Echo, Cardiac Doppler and Color Doppler Indications:     CHF 428.21  History:         Patient has prior history of Echocardiogram examinations. Risk                  Factors:Diabetes. Pregnancy induced                  Pregnancy induced HTN.  Sonographer:     Neysa Bonito Roar Referring Phys:  [id number] Inetta Fermo Leler Brion Diagnosing Phys: Yvonne Kendall MD IMPRESSIONS  1. Left ventricular ejection fraction, by estimation, is 20 to 25%. The left ventricle has severely decreased function. The left ventricle demonstrates global hypokinesis. The left ventricular internal cavity size was mildly dilated. There is mild left ventricular hypertrophy. Indeterminate diastolic filling due to E-A fusion.  2. Right ventricular systolic function is moderately reduced. The right ventricular size is normal. There is mildly elevated pulmonary artery systolic pressure. The estimated right ventricular systolic pressure is 35.5 mmHg.  3. Left atrial size was mildly dilated.  4. The mitral valve is normal in structure. Mild mitral valve regurgitation. No evidence of mitral stenosis.  5. The aortic valve is tricuspid. Aortic valve  regurgitation is not visualized. No aortic stenosis is present.  6. The inferior vena cava is normal in size with <50% respiratory variability, suggesting right atrial pressure of 8 mmHg. FINDINGS  Left Ventricle: Left ventricular ejection fraction, by estimation, is 20 to 25%. The left ventricle has severely decreased function. The left ventricle demonstrates global hypokinesis. The left ventricular internal cavity size was mildly dilated. There is mild left ventricular hypertrophy. Indeterminate diastolic filling due to E-A fusion. Right Ventricle: The right ventricular size is normal. No increase in right ventricular wall thickness. Right ventricular systolic function is moderately reduced. There is mildly elevated pulmonary artery systolic pressure. The tricuspid regurgitant velocity is 2.62 m/s, and with an assumed right atrial pressure of 8 mmHg, the estimated right ventricular systolic pressure is 35.5 mmHg. Left Atrium: Left atrial size was mildly dilated. Right Atrium: Right atrial size was normal in size. Pericardium: There is no evidence of pericardial effusion. Mitral Valve: The mitral valve is normal in structure. Mild mitral valve regurgitation. No evidence of mitral valve stenosis. Tricuspid Valve: The tricuspid valve is normal in structure. Tricuspid valve regurgitation is mild. Aortic Valve: The aortic valve is tricuspid. Aortic valve regurgitation is not visualized. No aortic stenosis is present. Aortic valve mean gradient measures 2.0 mmHg. Aortic valve peak gradient measures 4.0 mmHg. Aortic valve area, by VTI measures 1.50 cm. Pulmonic Valve: The pulmonic valve was normal in structure. Pulmonic valve regurgitation is mild. No evidence of pulmonic stenosis. Aorta: The aortic root is normal in size and structure. Pulmonary Artery: The pulmonary artery is not well seen. Venous: The inferior vena cava is normal in size with less than 50% respiratory variability, suggesting right atrial pressure of  8 mmHg. IAS/Shunts: No atrial level shunt detected by color flow Doppler.  LEFT VENTRICLE PLAX 2D LVIDd:         5.39 cm      Diastology LVIDs:         4.81 cm      LV e' lateral:   6.20 cm/s LV  PW:         1.15 cm      LV E/e' lateral: 16.0 LV IVS:        1.07 cm      LV e' medial:    5.66 cm/s LVOT diam:     1.80 cm      LV E/e' medial:  17.5 LV SV:         19 LV SV Index:   11 LVOT Area:     2.54 cm  LV Volumes (MOD) LV vol d, MOD A2C: 121.0 ml LV vol d, MOD A4C: 119.0 ml LV vol s, MOD A2C: 93.9 ml LV vol s, MOD A4C: 94.0 ml LV SV MOD A2C:     27.1 ml LV SV MOD A4C:     119.0 ml LV SV MOD BP:      25.5 ml RIGHT VENTRICLE RV Mid diam:    3.71 cm RV S prime:     8.81 cm/s LEFT ATRIUM             Index       RIGHT ATRIUM           Index LA diam:        4.10 cm 2.37 cm/m  RA Area:     15.20 cm LA Vol (A2C):   64.0 ml 36.96 ml/m RA Volume:   44.10 ml  25.47 ml/m LA Vol (A4C):   64.8 ml 37.42 ml/m LA Biplane Vol: 65.7 ml 37.94 ml/m  AORTIC VALVE                   PULMONIC VALVE AV Area (Vmax):    1.32 cm    PV Vmax:        0.62 m/s AV Area (Vmean):   1.46 cm    PV Peak grad:   1.6 mmHg AV Area (VTI):     1.50 cm    RVOT Peak grad: 1 mmHg AV Vmax:           100.00 cm/s AV Vmean:          66.400 cm/s AV VTI:            0.124 m AV Peak Grad:      4.0 mmHg AV Mean Grad:      2.0 mmHg LVOT Vmax:         51.70 cm/s LVOT Vmean:        38.000 cm/s LVOT VTI:          0.073 m LVOT/AV VTI ratio: 0.59  AORTA Ao Root diam: 2.60 cm MITRAL VALVE               TRICUSPID VALVE MV Area (PHT): 7.16 cm    TR Peak grad:   27.5 mmHg MV Decel Time: 106 msec    TR Vmax:        262.00 cm/s MV E velocity: 99.00 cm/s MV A velocity: 56.30 cm/s  SHUNTS MV E/A ratio:  1.76        Systemic VTI:  0.07 m                            Systemic Diam: 1.80 cm Yvonne Kendall MD Electronically signed by Yvonne Kendall MD Signature Date/Time: 11/08/2019/7:49:46 AM    Final      Scheduled Meds: . atorvastatin  40 mg Oral q1800  . carvedilol   6.25 mg Oral BID WC  .  digoxin  0.125 mg Oral Daily  . enoxaparin (LOVENOX) injection  40 mg Subcutaneous Q24H  . furosemide  40 mg Intravenous BID  . gabapentin  300 mg Oral TID  . insulin aspart  0-15 Units Subcutaneous TID WC  . insulin aspart  10 Units Subcutaneous TID WC  . insulin glargine  40 Units Subcutaneous Daily  . PARoxetine  10 mg Oral Daily  . pneumococcal 23 valent vaccine  0.5 mL Intramuscular Tomorrow-1000  . potassium chloride  30 mEq Oral BID  . sacubitril-valsartan  1 tablet Oral BID  . sodium chloride flush  3 mL Intravenous Q12H  . spironolactone  25 mg Oral Daily   Continuous Infusions: . sodium chloride       LOS: 1 day     Enzo Bi, MD [facility] If 7PM-7AM, please contact night-coverage 11/08/2019, 7:23 PM

## 2019-11-08 NOTE — Progress Notes (Signed)
Progress Note  Patient Name: Cheryl Mercer Date of Encounter: 11/08/2019  Primary Cardiologist: Bensimhon  Subjective   Dyspnea much improved. No chest pain or palpitations. Documented UOP 720 mL for the past 24 hours with a net - 480 mL for the admission. Uncertain accuracy of these readings. Weight 73.9-->65 kg over the past 24 hours. Renal function stable. Potassium 3.9-->3.0. Magnesium 1.5.   Inpatient Medications    Scheduled Meds: . atorvastatin  40 mg Oral q1800  . carvedilol  6.25 mg Oral BID WC  . digoxin  0.125 mg Oral Daily  . enoxaparin (LOVENOX) injection  40 mg Subcutaneous Q24H  . furosemide  20 mg Intravenous BID  . gabapentin  300 mg Oral TID  . insulin aspart  0-15 Units Subcutaneous TID WC  . insulin aspart  10 Units Subcutaneous TID WC  . insulin glargine  40 Units Subcutaneous Daily  . PARoxetine  10 mg Oral Daily  . pneumococcal 23 valent vaccine  0.5 mL Intramuscular Tomorrow-1000  . sacubitril-valsartan  1 tablet Oral BID  . sodium chloride flush  3 mL Intravenous Q12H  . spironolactone  25 mg Oral Daily   Continuous Infusions: . sodium chloride     PRN Meds: sodium chloride, acetaminophen, ondansetron (ZOFRAN) IV, sodium chloride flush, SUMAtriptan   Vital Signs    Vitals:   11/07/19 2124 11/08/19 0351 11/08/19 0438 11/08/19 0748  BP: 104/62  108/75 111/88  Pulse: 87  88 88  Resp:   18 18  Temp: 98.1 F (36.7 C)  (!) 97.3 F (36.3 C) 98.2 F (36.8 C)  TempSrc: Oral  Oral   SpO2: 100%  99% 99%  Weight:  65 kg    Height:        Intake/Output Summary (Last 24 hours) at 11/08/2019 0805 Last data filed at 11/07/2019 1845 Gross per 24 hour  Intake 480 ml  Output 1200 ml  Net -720 ml   Filed Weights   11/07/19 0258 11/08/19 0351  Weight: 73.9 kg 65 kg    Telemetry    Not on tele - Personally Reviewed  ECG    No new tracings - Personally Reviewed  Physical Exam   GEN: No acute distress.   Neck: No JVD. Cardiac:  RRR, no murmurs, rubs, or gallops.  Respiratory: Clear to auscultation bilaterally.  GI: Soft, nontender, non-distended.   MS: No edema; No deformity. Neuro:  Alert and oriented x 3; Nonfocal.  Psych: Normal affect.  Labs    Chemistry Recent Labs  Lab 11/07/19 0307 11/08/19 0542  NA 132* 134*  K 3.9 3.0*  CL 97* 97*  CO2 26 29  GLUCOSE 490* 299*  BUN 9 13  CREATININE 0.55 0.49  CALCIUM 8.6* 7.9*  PROT 6.8  --   ALBUMIN 2.8*  --   AST 36  --   ALT 15  --   ALKPHOS 126  --   BILITOT 0.6  --   GFRNONAA >60 >60  GFRAA >60 >60  ANIONGAP 9 8     Hematology Recent Labs  Lab 11/07/19 0307 11/08/19 0542  WBC 10.7* 9.2  RBC 4.23 4.73  HGB 12.5 14.0  HCT 37.8 41.4  MCV 89.4 87.5  MCH 29.6 29.6  MCHC 33.1 33.8  RDW 13.2 13.2  PLT 330 391    Cardiac EnzymesNo results for input(s): TROPONINI in the last 168 hours. No results for input(s): TROPIPOC in the last 168 hours.   BNP Recent Labs  Lab  11/07/19 0307  BNP 965.0*     DDimer No results for input(s): DDIMER in the last 168 hours.   Radiology    DG Chest 1 View  Result Date: 11/07/2019 IMPRESSION: 1. Cardiomegaly with mild perihilar vascular congestion without overt pulmonary edema. 2. No other active cardiopulmonary disease. Electronically Signed   By: Rise Mu M.D.   On: 11/07/2019 03:36   Cardiac Studies   2D echo 11/07/2019: 1. Left ventricular ejection fraction, by estimation, is 20 to 25%. The  left ventricle has severely decreased function. The left ventricle  demonstrates global hypokinesis. The left ventricular internal cavity size  was mildly dilated. There is mild left ventricular hypertrophy. Indeterminate diastolic filling due to E-A fusion.  2. Right ventricular systolic function is moderately reduced. The right  ventricular size is normal. There is mildly elevated pulmonary artery  systolic pressure. The estimated right ventricular systolic pressure is  35.5 mmHg.  3. Left  atrial size was mildly dilated.  4. The mitral valve is normal in structure. Mild mitral valve  regurgitation. No evidence of mitral stenosis.  5. The aortic valve is tricuspid. Aortic valve regurgitation is not  visualized. No aortic stenosis is present.  6. The inferior vena cava is normal in size with <50% respiratory  variability, suggesting right atrial pressure of 8 mmHg.   Patient Profile     34 y.o. female with history of HFrEF secondary to NICM, poorly controlled IDDM, recent COVID-19 PNA in 08/2019, HTN, migraines and chronic back pain who is being seen today for the evaluation of CHF at the request of Dr. Para March.  Assessment & Plan    1. Acute on chronic HFrEF secondary to NICM: -Much improved -I/O appear inaccurate, though weight is significantly improved and consistent with prior outpatient weight at which time she was felt to be euvolemic  -Previously not requiring scheduled diuretic therapy as an outpatient, though her CM may have been exacerbated following her COVID PNA -Transition from IV Lasix 20 mg bid to PO Lasix 20 mg daily -Continue GDMT including Entresto, Coreg, spironolactone and digoxin -Resume Jardiance at discharge  -As an outpatient, consider ivabradine  -Digoxin level < 0.2 this admission -Echo as above with slight improvement in LVSF, though remains < 35% -There was some medical nonadherence prior to her admission, in this setting, recommend adherence to medical therapy with optimization of GDMT followed by potential referral to EP for consideration of ICD if EF remains < 35% with medical compliance on GDMT -CHF education -Daily weights -Strict I/O -Will check a ReDs vest this morning  2. Elevated troponin: -Minimally elevated and not consistent with ACS -Recent LHC with normal coronary arteries  -Echo as above -No indication for heparin gtt at this time -No need for further HS-Tn cycling -ASA  3. HTN: -BP well controlled -Continue  medications as above  4. Recent COVID-19 PNA: -Echo as above  5. Poorly controlled IDDM: -Per IM  6. Hypokalemia/hypomagnesemia: -Replete potassium and magnesium to goal 4.0 and 2.0, respectively   For questions or updates, please contact [facility] Please consult [email or web address] for contact info under Cardiology/STEMI.    Signed, Eula Listen, PA-C [facility] Pager: [phone] 11/08/2019, 8:05 AM

## 2019-11-08 NOTE — Progress Notes (Signed)
Spoke to pharmacy-  OK to wait until next lasix this evening since I had already given morning 20 mg lasix.

## 2019-11-08 NOTE — Progress Notes (Signed)
Inpatient Diabetes Program Recommendations  AACE/ADA: New Consensus Statement on Inpatient Glycemic Control   Target Ranges:  Prepandial:   less than 140 mg/dL      Peak postprandial:   less than 180 mg/dL (1-2 hours)      Critically ill patients:  140 - 180 mg/dL   Results for Cheryl Mercer, Cheryl Mercer (MRN [id number]) as of 11/08/2019 09:47  Ref. Range 11/07/2019 07:30 11/07/2019 11:21 11/07/2019 16:32 11/07/2019 22:21 11/08/2019 07:52  Glucose-Capillary Latest Ref Range: 70 - 99 mg/dL 562 (H)  Novolog 15 units 375 (H)  Novolog 25 units  Lantus 40 units 99 360 (H) 237 (H)  Novolog 15 units  Lantus 40 units   Review of Glycemic Control  Diabetes history: DM2 Outpatient Diabetes medications: Lantus 40 units daily, Novolog 10 units TID with meals, Jardiance 10 mg daily, Metformin 500 mg BID Current orders for Inpatient glycemic control: Lantus 40 units QHS, Novolog 0-15 units TID with meals, Novolog 10 units TID with meals  Inpatient Diabetes Program Recommendations:   Insulin-Meal Coverage: Noted meal coverage was NOT GIVEN with supper last night. As a result, glucose up to 360 mg/dl at 13:08 last night.  Insulin-Correction: Please consider ordering Novolog 0-5 units QHS for bedtime correction in order to correct glucose at bedtime to help prevent fasting hyperglycemia.  HgbA1C: A1C 13.8% on 11/07/19 on 11/07/19 indicating an average glucose of 349 mg/dl over the past 2-3 months.  NOTE: Spoke with patient over the phone about diabetes and home regimen for diabetes control. Patient reports that she now has Medicaid and that after she was discharged from the hospital the last time on 09/08/19, she was able to get all her DM medications filled. However, she has not been able to take the Lantus or Novolog because she does not have any insulin pen needles for her insulin pens.  Patient reports that it was going to be over $100 for her to get insulin pen needles. Informed patient that she could  go to [facility] and get insulin pen needles for $9 per box of 50 insulin pen needles.  Patient states she was not aware she could get them at [facility] for this price.   Discussed A1C results (13.8% on 11/07/19 ) and explained that current A1C indicates an average glucose of 349 mg/dl over the past 2-3 months. Explained that her A1C has increased from 12.4% on 09/07/19 to now 13.8%.  Discussed glucose and A1C goals. Discussed importance of checking CBGs and maintaining good CBG control to prevent long-term and short-term complications. Explained how hyperglycemia leads to damage within blood vessels which lead to the common complications seen with uncontrolled diabetes. Stressed to the patient the importance of improving glycemic control to prevent further complications from uncontrolled diabetes. Discussed impact of nutrition, exercise, stress, sickness, and medications on diabetes control. Patient states that she has everything at home for glucose monitoring, she has all DM medications but she does need to get insulin pen needles. Patient states that she will plan to go to [facility] and get insulin pen needles. Encouraged patient to make follow up appointment with PCP regarding DM management. Asked patient to take glucometer to follow up appointment so PCP can assist with improving DM control.  Patient verbalized understanding of information discussed and she states she has no questions at this time   Thanks, Orlando Penner, RN, MSN, CDE Diabetes Coordinator Inpatient Diabetes Program [phone] (Team Pager from 8am to 5pm)

## 2019-11-09 LAB — BASIC METABOLIC PANEL
Anion gap: 7 (ref 5–15)
BUN: 17 mg/dL (ref 6–20)
CO2: 29 mmol/L (ref 22–32)
Calcium: 7.9 mg/dL — ABNORMAL LOW (ref 8.9–10.3)
Chloride: 100 mmol/L (ref 98–111)
Creatinine, Ser: 0.51 mg/dL (ref 0.44–1.00)
GFR calc Af Amer: >60 mL/min (ref >60)
GFR calc non Af Amer: >60 mL/min (ref >60)
Glucose, Bld: 304 mg/dL — ABNORMAL HIGH (ref 70–99)
Potassium: 3.7 mmol/L (ref 3.5–5.1)
Sodium: 136 mmol/L (ref 135–145)

## 2019-11-09 LAB — MAGNESIUM: Magnesium: 1.7 mg/dL (ref 1.7–2.4)

## 2019-11-09 LAB — CBC
HCT: 41.3 % (ref 36.0–46.0)
Hemoglobin: 13.7 g/dL (ref 12.0–15.0)
MCH: 29.2 pg (ref 26.0–34.0)
MCHC: 33.2 g/dL (ref 30.0–36.0)
MCV: 88.1 fL (ref 80.0–100.0)
Platelets: 383 10*3/uL (ref 150–400)
RBC: 4.69 MIL/uL (ref 3.87–5.11)
RDW: 13.2 % (ref 11.5–15.5)
WBC: 8.6 10*3/uL (ref 4.0–10.5)
nRBC: 0 % (ref 0.0–0.2)

## 2019-11-09 LAB — GLUCOSE, CAPILLARY
Glucose-Capillary: 253 mg/dL — ABNORMAL HIGH (ref 70–99)
Glucose-Capillary: 183 mg/dL — ABNORMAL HIGH (ref 70–99)
Glucose-Capillary: 259 mg/dL — ABNORMAL HIGH (ref 70–99)
Glucose-Capillary: 105 mg/dL — ABNORMAL HIGH (ref 70–99)

## 2019-11-09 MED ORDER — INSULIN ASPART 100 UNIT/ML ~~LOC~~ SOLN
0.0000 [IU] | Freq: Three times a day (TID) | SUBCUTANEOUS | Status: DC
  Administered 2019-11-09: 8 [IU] via SUBCUTANEOUS
  Administered 2019-11-09: 3 [IU] via SUBCUTANEOUS
  Administered 2019-11-10: 11 [IU] via SUBCUTANEOUS
  Administered 2019-11-10: 5 [IU] via SUBCUTANEOUS
  Filled 2019-11-09 (×4): qty 1

## 2019-11-09 MED ORDER — INSULIN GLARGINE 100 UNIT/ML ~~LOC~~ SOLN
43.0000 [IU] | Freq: Every day | SUBCUTANEOUS | Status: DC
  Administered 2019-11-10: 43 [IU] via SUBCUTANEOUS
  Filled 2019-11-09 (×2): qty 0.43

## 2019-11-09 MED ORDER — INSULIN ASPART 100 UNIT/ML ~~LOC~~ SOLN: 0.0000 [IU] | Freq: Every day | SUBCUTANEOUS | Status: DC

## 2019-11-09 MED ORDER — INSULIN GLARGINE 100 UNIT/ML ~~LOC~~ SOLN
3.0000 [IU] | Freq: Once | SUBCUTANEOUS | Status: AC
  Administered 2019-11-09: 3 [IU] via SUBCUTANEOUS
  Filled 2019-11-09: qty 0.03

## 2019-11-09 MED ORDER — INSULIN ASPART 100 UNIT/ML ~~LOC~~ SOLN
10.0000 [IU] | Freq: Three times a day (TID) | SUBCUTANEOUS | Status: DC
  Administered 2019-11-09 – 2019-11-10 (×5): 10 [IU] via SUBCUTANEOUS
  Filled 2019-11-09 (×4): qty 1

## 2019-11-09 NOTE — Plan of Care (Signed)

## 2019-11-09 NOTE — Progress Notes (Signed)
PROGRESS NOTE    Cheryl Mercer  QIW:[id number] DOB: Sep 04, 1985 DOA: 11/07/2019 PCP: Evelene Croon, MD      Assessment & Plan:   Principal Problem:   Acute on chronic combined systolic and diastolic CHF (congestive heart failure) (HCC) Active Problems:   Uncontrolled type 2 diabetes mellitus with hyperglycemia (HCC)   Essential hypertension   History of COVID-19   Acute on chronic combined systolic (congestive) and diastolic (congestive) heart failure (HCC)   Elevated troponin   Acute on chronic combined CHF exacerbation: w/ nonischemic cardiomyopathy. Last EF 10 to 15%.  TTE during current admission showed LVEF 20-25%. Poor compliance with medication and follow-up for social/financial reasons. Continue on IV lasix, coreg, digoxin, entresto, spironolactone. Monitor I/Os. Cardio following and recs apprec  Elevated troponin: secondary to demand ischemia from heart failure exacerbation. No chest pain and EKG is nonacute  DM2: with hyperglycemia. Uncontrolled w/ HbA1c 13.8. No insulin for at least 2 months because she couldn't afford the needles for the insulin pens. Continue on Lantus, aspart & SSI w/ accuchecks. Order needles at discharge with instruction for pharm to supply whatever pt's insurance covers  Essential hypertension: continue coreg, lasix, entresto as well as spironolactone    History of COVID-19: No acute concerns at this time   DVT prophylaxis: lovenox Code Status: full  Family Communication: Disposition Plan: will likely d/c home in 24-48 hours    Consultants:   cardio   Procedures:    Antimicrobials:    Subjective: Pt c/o malaise  Objective: Vitals:   11/08/19 1554 11/08/19 1946 11/09/19 0441 11/09/19 0740  BP: 110/72 113/79 107/75 (!) 122/97  Pulse: 96 98 95 (!) 102  Resp: 20 15 16 16   Temp:  98.1 F (36.7 C) (!) 97.5 F (36.4 C) 98.3 F (36.8 C)  TempSrc:  Oral Oral Oral  SpO2: 100% 98% 100% 98%  Weight:   64.4 kg     Height:        Intake/Output Summary (Last 24 hours) at 11/09/2019 0900 Last data filed at 11/09/2019 0740 Gross per 24 hour  Intake 480 ml  Output 4000 ml  Net -3520 ml   Filed Weights   11/07/19 0258 11/08/19 0351 11/09/19 0441  Weight: 73.9 kg 65 kg 64.4 kg    Examination:  General exam: Appears calm and comfortable  Respiratory system: Clear to auscultation. Respiratory effort normal. Cardiovascular system: S1 & S2+. No rubs, gallops or clicks. Gastrointestinal system: Abdomen is nondistended, soft and nontender. . Normal bowel sounds heard. Central nervous system: Alert and oriented. Moves all 4 extremities Psychiatry: Judgement and insight appear normal. Mood & affect appropriate.     Data Reviewed: I have personally reviewed following labs and imaging studies  CBC: Recent Labs  Lab 11/07/19 0307 11/08/19 0542 11/09/19 0551  WBC 10.7* 9.2 8.6  HGB 12.5 14.0 13.7  HCT 37.8 41.4 41.3  MCV 89.4 87.5 88.1  PLT 330 391 383   Basic Metabolic Panel: Recent Labs  Lab 11/07/19 0307 11/08/19 0542 11/09/19 0551  NA 132* 134* 136  K 3.9 3.0* 3.7  CL 97* 97* 100  CO2 26 29 29   GLUCOSE 490* 299* 304*  BUN 9 13 17   CREATININE 0.55 0.49 0.51  CALCIUM 8.6* 7.9* 7.9*  MG  --  1.5* 1.7   GFR: Estimated Creatinine Clearance: 86.7 mL/min (by C-G formula based on SCr of 0.51 mg/dL). Liver Function Tests: Recent Labs  Lab 11/07/19 0307  AST 36  ALT 15  ALKPHOS  126  BILITOT 0.6  PROT 6.8  ALBUMIN 2.8*   No results for input(s): LIPASE, AMYLASE in the last 168 hours. No results for input(s): AMMONIA in the last 168 hours. Coagulation Profile: No results for input(s): INR, PROTIME in the last 168 hours. Cardiac Enzymes: No results for input(s): CKTOTAL, CKMB, CKMBINDEX, TROPONINI in the last 168 hours. BNP (last 3 results) No results for input(s): PROBNP in the last 8760 hours. HbA1C: Recent Labs    11/07/19 0728  HGBA1C 13.8*   CBG: Recent Labs  Lab  11/08/19 0752 11/08/19 1154 11/08/19 1659 11/08/19 2103 11/09/19 0741  GLUCAP 237* 127* 153* 220* 253*   Lipid Profile: No results for input(s): CHOL, HDL, LDLCALC, TRIG, CHOLHDL, LDLDIRECT in the last 72 hours. Thyroid Function Tests: No results for input(s): TSH, T4TOTAL, FREET4, T3FREE, THYROIDAB in the last 72 hours. Anemia Panel: No results for input(s): VITAMINB12, FOLATE, FERRITIN, TIBC, IRON, RETICCTPCT in the last 72 hours. Sepsis Labs: No results for input(s): PROCALCITON, LATICACIDVEN in the last 168 hours.  No results found for this or any previous visit (from the past 240 hour(s)).       Radiology Studies: ECHOCARDIOGRAM COMPLETE  Result Date: 11/08/2019    ECHOCARDIOGRAM REPORT   Patient Name:   Cheryl Mercer Date of Exam: 11/07/2019 Medical Rec #:  [id number]              Height:       61.0 in Accession #:    [id number]             Weight:       163.0 lb Date of Birth:  1986-08-08             BSA:          1.731 m Patient Age:    33 years               BP:           125/85 mmHg Patient Gender: F                      HR:           95 bpm. Exam Location:  [facility] Procedure: 2D Echo, Cardiac Doppler and Color Doppler Indications:     CHF 428.21  History:         Patient has prior history of Echocardiogram examinations. Risk                  Factors:Diabetes. Pregnancy induced                  Pregnancy induced HTN.  Sonographer:     Neysa Bonito Roar Referring Phys:  [id number] Inetta Fermo LAI Diagnosing Phys: Yvonne Kendall MD IMPRESSIONS  1. Left ventricular ejection fraction, by estimation, is 20 to 25%. The left ventricle has severely decreased function. The left ventricle demonstrates global hypokinesis. The left ventricular internal cavity size was mildly dilated. There is mild left ventricular hypertrophy. Indeterminate diastolic filling due to E-A fusion.  2. Right ventricular systolic function is moderately reduced. The right ventricular size is normal. There is mildly elevated  pulmonary artery systolic pressure. The estimated right ventricular systolic pressure is 35.5 mmHg.  3. Left atrial size was mildly dilated.  4. The mitral valve is normal in structure. Mild mitral valve regurgitation. No evidence of mitral stenosis.  5. The aortic valve is tricuspid. Aortic valve regurgitation is not visualized. No aortic stenosis is present.  6. The inferior vena cava is normal in size with <50% respiratory variability, suggesting right atrial pressure of 8 mmHg. FINDINGS  Left Ventricle: Left ventricular ejection fraction, by estimation, is 20 to 25%. The left ventricle has severely decreased function. The left ventricle demonstrates global hypokinesis. The left ventricular internal cavity size was mildly dilated. There is mild left ventricular hypertrophy. Indeterminate diastolic filling due to E-A fusion. Right Ventricle: The right ventricular size is normal. No increase in right ventricular wall thickness. Right ventricular systolic function is moderately reduced. There is mildly elevated pulmonary artery systolic pressure. The tricuspid regurgitant velocity is 2.62 m/s, and with an assumed right atrial pressure of 8 mmHg, the estimated right ventricular systolic pressure is 35.5 mmHg. Left Atrium: Left atrial size was mildly dilated. Right Atrium: Right atrial size was normal in size. Pericardium: There is no evidence of pericardial effusion. Mitral Valve: The mitral valve is normal in structure. Mild mitral valve regurgitation. No evidence of mitral valve stenosis. Tricuspid Valve: The tricuspid valve is normal in structure. Tricuspid valve regurgitation is mild. Aortic Valve: The aortic valve is tricuspid. Aortic valve regurgitation is not visualized. No aortic stenosis is present. Aortic valve mean gradient measures 2.0 mmHg. Aortic valve peak gradient measures 4.0 mmHg. Aortic valve area, by VTI measures 1.50 cm. Pulmonic Valve: The pulmonic valve was normal in structure. Pulmonic valve  regurgitation is mild. No evidence of pulmonic stenosis. Aorta: The aortic root is normal in size and structure. Pulmonary Artery: The pulmonary artery is not well seen. Venous: The inferior vena cava is normal in size with less than 50% respiratory variability, suggesting right atrial pressure of 8 mmHg. IAS/Shunts: No atrial level shunt detected by color flow Doppler.  LEFT VENTRICLE PLAX 2D LVIDd:         5.39 cm      Diastology LVIDs:         4.81 cm      LV e' lateral:   6.20 cm/s LV PW:         1.15 cm      LV E/e' lateral: 16.0 LV IVS:        1.07 cm      LV e' medial:    5.66 cm/s LVOT diam:     1.80 cm      LV E/e' medial:  17.5 LV SV:         19 LV SV Index:   11 LVOT Area:     2.54 cm  LV Volumes (MOD) LV vol d, MOD A2C: 121.0 ml LV vol d, MOD A4C: 119.0 ml LV vol s, MOD A2C: 93.9 ml LV vol s, MOD A4C: 94.0 ml LV SV MOD A2C:     27.1 ml LV SV MOD A4C:     119.0 ml LV SV MOD BP:      25.5 ml RIGHT VENTRICLE RV Mid diam:    3.71 cm RV S prime:     8.81 cm/s LEFT ATRIUM             Index       RIGHT ATRIUM           Index LA diam:        4.10 cm 2.37 cm/m  RA Area:     15.20 cm LA Vol (A2C):   64.0 ml 36.96 ml/m RA Volume:   44.10 ml  25.47 ml/m LA Vol (A4C):   64.8 ml 37.42 ml/m LA Biplane Vol: 65.7 ml 37.94 ml/m  AORTIC VALVE                   PULMONIC VALVE AV Area (Vmax):    1.32 cm    PV Vmax:        0.62 m/s AV Area (Vmean):   1.46 cm    PV Peak grad:   1.6 mmHg AV Area (VTI):     1.50 cm    RVOT Peak grad: 1 mmHg AV Vmax:           100.00 cm/s AV Vmean:          66.400 cm/s AV VTI:            0.124 m AV Peak Grad:      4.0 mmHg AV Mean Grad:      2.0 mmHg LVOT Vmax:         51.70 cm/s LVOT Vmean:        38.000 cm/s LVOT VTI:          0.073 m LVOT/AV VTI ratio: 0.59  AORTA Ao Root diam: 2.60 cm MITRAL VALVE               TRICUSPID VALVE MV Area (PHT): 7.16 cm    TR Peak grad:   27.5 mmHg MV Decel Time: 106 msec    TR Vmax:        262.00 cm/s MV E velocity: 99.00 cm/s MV A velocity: 56.30  cm/s  SHUNTS MV E/A ratio:  1.76        Systemic VTI:  0.07 m                            Systemic Diam: 1.80 cm Nelva Bush MD Electronically signed by Nelva Bush MD Signature Date/Time: 11/08/2019/7:49:46 AM    Final         Scheduled Meds:  atorvastatin  40 mg Oral q1800   carvedilol  6.25 mg Oral BID WC   digoxin  0.125 mg Oral Daily   enoxaparin (LOVENOX) injection  40 mg Subcutaneous Q24H   furosemide  40 mg Intravenous BID   gabapentin  300 mg Oral TID   insulin aspart  0-15 Units Subcutaneous TID WC   insulin aspart  10 Units Subcutaneous TID WC   insulin glargine  40 Units Subcutaneous Daily   PARoxetine  10 mg Oral Daily   pneumococcal 23 valent vaccine  0.5 mL Intramuscular Tomorrow-1000   sacubitril-valsartan  1 tablet Oral BID   sodium chloride flush  3 mL Intravenous Q12H   spironolactone  25 mg Oral Daily   Continuous Infusions:  sodium chloride       LOS: 2 days    Time spent: 33 mins     Wyvonnia Dusky, MD [facility] Pager [phone]  If 7PM-7AM, please contact night-coverage [email or web address] 11/09/2019, 9:00 AM

## 2019-11-09 NOTE — Progress Notes (Signed)
Inpatient Diabetes Program Recommendations  AACE/ADA: New Consensus Statement on Inpatient Glycemic Control   Target Ranges:  Prepandial:   less than 140 mg/dL      Peak postprandial:   less than 180 mg/dL (1-2 hours)      Critically ill patients:  140 - 180 mg/dL   Results for Cheryl Mercer, Cheryl Mercer (MRN [id number]) as of 11/09/2019 11:10  Ref. Range 11/08/2019 07:52 11/08/2019 11:54 11/08/2019 16:59 11/08/2019 21:03 11/09/2019 07:41  Glucose-Capillary Latest Ref Range: 70 - 99 mg/dL 277 (H) 412 (H) 878 (H) 220 (H) 253 (H)   Results for Cheryl Mercer, Cheryl Mercer (MRN [id number]) as of 11/09/2019 11:10  Ref. Range 11/07/2019 07:28  Hemoglobin A1C Latest Ref Range: 4.8 - 5.6 % 13.8 (H)   Review of Glycemic Control  Diabetes history:DM2 Outpatient Diabetes medications:Lantus 40 units daily, Novolog 10 units TID with meals, Jardiance 10 mg daily, Metformin 500 mg BID Current orders for Inpatient glycemic control:Lantus 40 units QHS, Novolog 0-15 units TID with meals, Novolog 10 units TID with meals  Inpatient Diabetes Program Recommendations:  Insulin-Basal: Please consider increasing Lantus to 43 units QHS.  Insulin-Correction: CBG at bedtime was 220 mg/dl last night but not correct since no Novolog correctoin ordered for bedtime. Please consider ordering Novolog 0-5 units QHS for bedtime correction in order to correct glucose at bedtime to help prevent fasting hyperglycemia.  HgbA1C: A1C 13.8% on 11/07/19 on 11/07/19 indicating an average glucose of 349 mg/dl over the past 2-3 months. Patient has not been taking insulin because she could not afford insulin pen needles. She states that she will plan to go to [facility] and get insulin pen needles. Encouraged patient to make follow up appointment with PCP as well.  Thanks, Orlando Penner, RN, MSN, CDE Diabetes Coordinator Inpatient Diabetes Program [phone] (Team Pager from 8am to 5pm)

## 2019-11-09 NOTE — Progress Notes (Signed)
Spend significant time education patient on heart failure. Baseline knowledge on CHF is very limited. Patient seems to understand better now, asked cardiology to come by and discuss results of echo. I/Os being monitoring. All medications reviewed with patient and educated on use. Will continue to educate throughout the day.

## 2019-11-09 NOTE — Progress Notes (Signed)
Progress Note  Patient Name: Cheryl Mercer Date of Encounter: 11/09/2019  Primary Cardiologist: Bensimhon  Subjective   Shortness of breath is much improved compared to admission.  She still has occasional dyspnea when she walks to the bathroom but overall much better.  Inpatient Medications    Scheduled Meds: . atorvastatin  40 mg Oral q1800  . carvedilol  6.25 mg Oral BID WC  . digoxin  0.125 mg Oral Daily  . enoxaparin (LOVENOX) injection  40 mg Subcutaneous Q24H  . furosemide  40 mg Intravenous BID  . gabapentin  300 mg Oral TID  . insulin aspart  0-15 Units Subcutaneous TID WC  . insulin aspart  0-5 Units Subcutaneous QHS  . insulin aspart  10 Units Subcutaneous TID WC  . insulin glargine  3 Units Subcutaneous Once  . [START ON 11/10/2019] insulin glargine  43 Units Subcutaneous Daily  . PARoxetine  10 mg Oral Daily  . pneumococcal 23 valent vaccine  0.5 mL Intramuscular Tomorrow-1000  . sacubitril-valsartan  1 tablet Oral BID  . sodium chloride flush  3 mL Intravenous Q12H  . spironolactone  25 mg Oral Daily   Continuous Infusions: . sodium chloride     PRN Meds: sodium chloride, acetaminophen, ondansetron (ZOFRAN) IV, sodium chloride flush, SUMAtriptan   Vital Signs    Vitals:   11/08/19 1946 11/09/19 0441 11/09/19 0740 11/09/19 1137  BP: 113/79 107/75 (!) 122/97 109/73  Pulse: 98 95 (!) 102 94  Resp: 15 16 16 16   Temp: 98.1 F (36.7 C) (!) 97.5 F (36.4 C) 98.3 F (36.8 C) 97.7 F (36.5 C)  TempSrc: Oral Oral Oral Oral  SpO2: 98% 100% 98% 100%  Weight:  64.4 kg    Height:        Intake/Output Summary (Last 24 hours) at 11/09/2019 1325 Last data filed at 11/09/2019 1038 Gross per 24 hour  Intake 720 ml  Output 4300 ml  Net -3580 ml   Last 3 Weights 11/09/2019 11/08/2019 11/07/2019  Weight (lbs) 142 lb 143 lb 3.2 oz 163 lb  Weight (kg) 64.411 kg 64.955 kg 73.936 kg      Telemetry    Currently not on telemetry  ECG    No new tracing  obtained- Personally Reviewed  Physical Exam   GEN: No acute distress.   Neck: No JVD Cardiac: RRR, no murmurs, rubs, or gallops.  Respiratory: Clear to auscultation bilaterally. GI: Soft, nontender, non-distended  MS:  Trace edema; No deformity. Neuro:  Nonfocal  Psych: Normal affect   Labs    High Sensitivity Troponin:   Recent Labs  Lab 11/07/19 0307 11/07/19 0728  TROPONINIHS 43* 40*      Chemistry Recent Labs  Lab 11/07/19 0307 11/08/19 0542 11/09/19 0551  NA 132* 134* 136  K 3.9 3.0* 3.7  CL 97* 97* 100  CO2 26 29 29   GLUCOSE 490* 299* 304*  BUN 9 13 17   CREATININE 0.55 0.49 0.51  CALCIUM 8.6* 7.9* 7.9*  PROT 6.8  --   --   ALBUMIN 2.8*  --   --   AST 36  --   --   ALT 15  --   --   ALKPHOS 126  --   --   BILITOT 0.6  --   --   GFRNONAA >60 >60 >60  GFRAA >60 >60 >60  ANIONGAP 9 8 7      Hematology Recent Labs  Lab 11/07/19 0307 11/08/19 0542 11/09/19 0551  WBC  10.7* 9.2 8.6  RBC 4.23 4.73 4.69  HGB 12.5 14.0 13.7  HCT 37.8 41.4 41.3  MCV 89.4 87.5 88.1  MCH 29.6 29.6 29.2  MCHC 33.1 33.8 33.2  RDW 13.2 13.2 13.2  PLT 330 391 383    BNP Recent Labs  Lab 11/07/19 0307  BNP 965.0*     DDimer No results for input(s): DDIMER in the last 168 hours.   Radiology    ECHOCARDIOGRAM COMPLETE  Result Date: 11/08/2019    ECHOCARDIOGRAM REPORT   Patient Name:   Cheryl Mercer Date of Exam: 11/07/2019 Medical Rec #:  [id number]              Height:       61.0 in Accession #:    [id number]             Weight:       163.0 lb Date of Birth:  03/08/1986             BSA:          1.731 m Patient Age:    33 years               BP:           125/85 mmHg Patient Gender: F                      HR:           95 bpm. Exam Location:  [facility] Procedure: 2D Echo, Cardiac Doppler and Color Doppler Indications:     CHF 428.21  History:         Patient has prior history of Echocardiogram examinations. Risk                  Factors:Diabetes. Pregnancy induced                   Pregnancy induced HTN.  Sonographer:     Neysa Bonito Roar Referring Phys:  [id number] Inetta Fermo LAI Diagnosing Phys: Yvonne Kendall MD IMPRESSIONS  1. Left ventricular ejection fraction, by estimation, is 20 to 25%. The left ventricle has severely decreased function. The left ventricle demonstrates global hypokinesis. The left ventricular internal cavity size was mildly dilated. There is mild left ventricular hypertrophy. Indeterminate diastolic filling due to E-A fusion.  2. Right ventricular systolic function is moderately reduced. The right ventricular size is normal. There is mildly elevated pulmonary artery systolic pressure. The estimated right ventricular systolic pressure is 35.5 mmHg.  3. Left atrial size was mildly dilated.  4. The mitral valve is normal in structure. Mild mitral valve regurgitation. No evidence of mitral stenosis.  5. The aortic valve is tricuspid. Aortic valve regurgitation is not visualized. No aortic stenosis is present.  6. The inferior vena cava is normal in size with <50% respiratory variability, suggesting right atrial pressure of 8 mmHg. FINDINGS  Left Ventricle: Left ventricular ejection fraction, by estimation, is 20 to 25%. The left ventricle has severely decreased function. The left ventricle demonstrates global hypokinesis. The left ventricular internal cavity size was mildly dilated. There is mild left ventricular hypertrophy. Indeterminate diastolic filling due to E-A fusion. Right Ventricle: The right ventricular size is normal. No increase in right ventricular wall thickness. Right ventricular systolic function is moderately reduced. There is mildly elevated pulmonary artery systolic pressure. The tricuspid regurgitant velocity is 2.62 m/s, and with an assumed right atrial pressure of 8 mmHg, the estimated right ventricular systolic pressure is  35.5 mmHg. Left Atrium: Left atrial size was mildly dilated. Right Atrium: Right atrial size was normal in size. Pericardium:  There is no evidence of pericardial effusion. Mitral Valve: The mitral valve is normal in structure. Mild mitral valve regurgitation. No evidence of mitral valve stenosis. Tricuspid Valve: The tricuspid valve is normal in structure. Tricuspid valve regurgitation is mild. Aortic Valve: The aortic valve is tricuspid. Aortic valve regurgitation is not visualized. No aortic stenosis is present. Aortic valve mean gradient measures 2.0 mmHg. Aortic valve peak gradient measures 4.0 mmHg. Aortic valve area, by VTI measures 1.50 cm. Pulmonic Valve: The pulmonic valve was normal in structure. Pulmonic valve regurgitation is mild. No evidence of pulmonic stenosis. Aorta: The aortic root is normal in size and structure. Pulmonary Artery: The pulmonary artery is not well seen. Venous: The inferior vena cava is normal in size with less than 50% respiratory variability, suggesting right atrial pressure of 8 mmHg. IAS/Shunts: No atrial level shunt detected by color flow Doppler.  LEFT VENTRICLE PLAX 2D LVIDd:         5.39 cm      Diastology LVIDs:         4.81 cm      LV e' lateral:   6.20 cm/s LV PW:         1.15 cm      LV E/e' lateral: 16.0 LV IVS:        1.07 cm      LV e' medial:    5.66 cm/s LVOT diam:     1.80 cm      LV E/e' medial:  17.5 LV SV:         19 LV SV Index:   11 LVOT Area:     2.54 cm  LV Volumes (MOD) LV vol d, MOD A2C: 121.0 ml LV vol d, MOD A4C: 119.0 ml LV vol s, MOD A2C: 93.9 ml LV vol s, MOD A4C: 94.0 ml LV SV MOD A2C:     27.1 ml LV SV MOD A4C:     119.0 ml LV SV MOD BP:      25.5 ml RIGHT VENTRICLE RV Mid diam:    3.71 cm RV S prime:     8.81 cm/s LEFT ATRIUM             Index       RIGHT ATRIUM           Index LA diam:        4.10 cm 2.37 cm/m  RA Area:     15.20 cm LA Vol (A2C):   64.0 ml 36.96 ml/m RA Volume:   44.10 ml  25.47 ml/m LA Vol (A4C):   64.8 ml 37.42 ml/m LA Biplane Vol: 65.7 ml 37.94 ml/m  AORTIC VALVE                   PULMONIC VALVE AV Area (Vmax):    1.32 cm    PV Vmax:         0.62 m/s AV Area (Vmean):   1.46 cm    PV Peak grad:   1.6 mmHg AV Area (VTI):     1.50 cm    RVOT Peak grad: 1 mmHg AV Vmax:           100.00 cm/s AV Vmean:          66.400 cm/s AV VTI:            0.124 m AV Peak  Grad:      4.0 mmHg AV Mean Grad:      2.0 mmHg LVOT Vmax:         51.70 cm/s LVOT Vmean:        38.000 cm/s LVOT VTI:          0.073 m LVOT/AV VTI ratio: 0.59  AORTA Ao Root diam: 2.60 cm MITRAL VALVE               TRICUSPID VALVE MV Area (PHT): 7.16 cm    TR Peak grad:   27.5 mmHg MV Decel Time: 106 msec    TR Vmax:        262.00 cm/s MV E velocity: 99.00 cm/s MV A velocity: 56.30 cm/s  SHUNTS MV E/A ratio:  1.76        Systemic VTI:  0.07 m                            Systemic Diam: 1.80 cm Nelva Bush MD Electronically signed by Nelva Bush MD Signature Date/Time: 11/08/2019/7:49:46 AM    Final     Cardiac Studies   2D echo 11/07/2019: 1. Left ventricular ejection fraction, by estimation, is 20 to 25%. The  left ventricle has severely decreased function. The left ventricle  demonstrates global hypokinesis. The left ventricular internal cavity size  was mildly dilated. There is mild left ventricular hypertrophy. Indeterminate diastolic filling due to E-A fusion.  2. Right ventricular systolic function is moderately reduced. The right  ventricular size is normal. There is mildly elevated pulmonary artery  systolic pressure. The estimated right ventricular systolic pressure is  09.8 mmHg.  3. Left atrial size was mildly dilated.  4. The mitral valve is normal in structure. Mild mitral valve  regurgitation. No evidence of mitral stenosis.  5. The aortic valve is tricuspid. Aortic valve regurgitation is not  visualized. No aortic stenosis is present.  6. The inferior vena cava is normal in size with <50% respiratory  variability, suggesting right atrial pressure of 8 mmHg.   Patient Profile     34 y.o. female nonischemic cardiomyopathy, diabetes, hypertension who  presents due to worsening dyspnea likely secondary medication noncompliance.  Assessment & Plan    1. NICM EF 20-25% -Continue Coreg, Entresto, Aldactone, digoxin -Lasix 40 mg twice daily -Creatinine normal, net -3.5 L. -Still with occasional shortness of breath although improved.  Continue IV Lasix for 1 more day, switch to p.o. tomorrow.  2.. History of hypertension -Blood pressure controlled    Signed, Kate Sable, MD  11/09/2019, 1:25 PM

## 2019-11-10 DIAGNOSIS — I420 Dilated cardiomyopathy: Secondary | ICD-10-CM

## 2019-11-10 DIAGNOSIS — E1165 Type 2 diabetes mellitus with hyperglycemia: Secondary | ICD-10-CM

## 2019-11-10 DIAGNOSIS — Z8616 Personal history of COVID-19: Secondary | ICD-10-CM

## 2019-11-10 LAB — GLUCOSE, CAPILLARY
Glucose-Capillary: 374 mg/dL — ABNORMAL HIGH (ref 70–99)
Glucose-Capillary: 90 mg/dL (ref 70–99)
Glucose-Capillary: 212 mg/dL — ABNORMAL HIGH (ref 70–99)

## 2019-11-10 LAB — BASIC METABOLIC PANEL
Anion gap: 8 (ref 5–15)
BUN: 16 mg/dL (ref 6–20)
CO2: 27 mmol/L (ref 22–32)
Calcium: 8.2 mg/dL — ABNORMAL LOW (ref 8.9–10.3)
Chloride: 101 mmol/L (ref 98–111)
Creatinine, Ser: 0.59 mg/dL (ref 0.44–1.00)
GFR calc Af Amer: >60 mL/min (ref >60)
GFR calc non Af Amer: >60 mL/min (ref >60)
Glucose, Bld: 326 mg/dL — ABNORMAL HIGH (ref 70–99)
Potassium: 4.3 mmol/L (ref 3.5–5.1)
Sodium: 136 mmol/L (ref 135–145)

## 2019-11-10 LAB — MAGNESIUM: Magnesium: 1.6 mg/dL — ABNORMAL LOW (ref 1.7–2.4)

## 2019-11-10 LAB — CBC
HCT: 43.3 % (ref 36.0–46.0)
Hemoglobin: 14.2 g/dL (ref 12.0–15.0)
MCH: 29.6 pg (ref 26.0–34.0)
MCHC: 32.8 g/dL (ref 30.0–36.0)
MCV: 90.2 fL (ref 80.0–100.0)
Platelets: 371 10*3/uL (ref 150–400)
RBC: 4.8 MIL/uL (ref 3.87–5.11)
RDW: 13.2 % (ref 11.5–15.5)
WBC: 8.5 10*3/uL (ref 4.0–10.5)
nRBC: 0 % (ref 0.0–0.2)

## 2019-11-10 MED ORDER — FUROSEMIDE 20 MG PO TABS: 20.0000 mg | ORAL_TABLET | Freq: Every day | ORAL | 0 refills | Status: DC

## 2019-11-10 MED ORDER — FUROSEMIDE 40 MG PO TABS: 40.0000 mg | ORAL_TABLET | Freq: Every day | ORAL | 0 refills | Status: DC | PRN

## 2019-11-10 NOTE — Progress Notes (Signed)
Progress Note  Patient Name: Cheryl Mercer Date of Encounter: 11/10/2019  Primary Cardiologist: Bensimhon, [facility]  Subjective   Reports that she feels back to her baseline, no abdominal swelling, no significant leg edema Denies shortness of breath Has ambulated 7.8 L negative to date 2.7 L negative yesterday  Long discussion with her concerning reason for admission, reports that she ran out of her medications Reports she typically takes Lasix as needed Ran out of her other medications, she lost her insurance, husband lost his job Very recently got her insurance back and husband has a new job but did not have a chance to get her medications  Inpatient Medications    Scheduled Meds: . atorvastatin  40 mg Oral q1800  . carvedilol  6.25 mg Oral BID WC  . digoxin  0.125 mg Oral Daily  . enoxaparin (LOVENOX) injection  40 mg Subcutaneous Q24H  . furosemide  40 mg Intravenous BID  . gabapentin  300 mg Oral TID  . insulin aspart  0-15 Units Subcutaneous TID WC  . insulin aspart  0-5 Units Subcutaneous QHS  . insulin aspart  10 Units Subcutaneous TID WC  . insulin glargine  43 Units Subcutaneous Daily  . PARoxetine  10 mg Oral Daily  . pneumococcal 23 valent vaccine  0.5 mL Intramuscular Tomorrow-1000  . sacubitril-valsartan  1 tablet Oral BID  . sodium chloride flush  3 mL Intravenous Q12H  . spironolactone  25 mg Oral Daily   Continuous Infusions: . sodium chloride     PRN Meds: sodium chloride, acetaminophen, ondansetron (ZOFRAN) IV, sodium chloride flush, SUMAtriptan   Vital Signs    Vitals:   11/10/19 0034 11/10/19 0540 11/10/19 0756 11/10/19 1158  BP: 125/86  (!) 122/92 103/67  Pulse: 96  (!) 101 95  Resp: 17  18 18   Temp: 97.8 F (36.6 C)  98.4 F (36.9 C)   TempSrc:   Oral   SpO2: 98%  98% 100%  Weight:  63.8 kg    Height:        Intake/Output Summary (Last 24 hours) at 11/10/2019 1403 Last data filed at 11/10/2019 1300 Gross per 24 hour  Intake  830 ml  Output 2200 ml  Net -1370 ml   Last 3 Weights 11/10/2019 11/09/2019 11/08/2019  Weight (lbs) 140 lb 10.5 oz 142 lb 143 lb 3.2 oz  Weight (kg) 63.8 kg 64.411 kg 64.955 kg      Telemetry    Normal sinus rhythm- Personally Reviewed  ECG     - Personally Reviewed  Physical Exam   GEN: No acute distress.   Neck:  JVD 8 Cardiac: RRR, no murmurs, rubs, or gallops.  Respiratory: Clear to auscultation bilaterally. GI: Soft, nontender, non-distended  MS: No edema; No deformity. Neuro:  Nonfocal  Psych: Normal affect   Labs    High Sensitivity Troponin:   Recent Labs  Lab 11/07/19 0307 11/07/19 0728  TROPONINIHS 43* 40*      Chemistry Recent Labs  Lab 11/07/19 0307 11/07/19 0307 11/08/19 0542 11/09/19 0551 11/10/19 0627  NA 132*   < > 134* 136 136  K 3.9   < > 3.0* 3.7 4.3  CL 97*   < > 97* 100 101  CO2 26   < > 29 29 27   GLUCOSE 490*   < > 299* 304* 326*  BUN 9   < > 13 17 16   CREATININE 0.55   < > 0.49 0.51 0.59  CALCIUM 8.6*   < >  7.9* 7.9* 8.2*  PROT 6.8  --   --   --   --   ALBUMIN 2.8*  --   --   --   --   AST 36  --   --   --   --   ALT 15  --   --   --   --   ALKPHOS 126  --   --   --   --   BILITOT 0.6  --   --   --   --   GFRNONAA >60   < > >60 >60 >60  GFRAA >60   < > >60 >60 >60  ANIONGAP 9   < > 8 7 8    < > = values in this interval not displayed.     Hematology Recent Labs  Lab 11/08/19 0542 11/09/19 0551 11/10/19 0627  WBC 9.2 8.6 8.5  RBC 4.73 4.69 4.80  HGB 14.0 13.7 14.2  HCT 41.4 41.3 43.3  MCV 87.5 88.1 90.2  MCH 29.6 29.2 29.6  MCHC 33.8 33.2 32.8  RDW 13.2 13.2 13.2  PLT 391 383 371    BNP Recent Labs  Lab 11/07/19 0307  BNP 965.0*    Digoxin level was low less than 0.2, was not taking the medication  DDimer No results for input(s): DDIMER in the last 168 hours.   Radiology    Chest x-ray IMPRESSION: 1. Cardiomegaly with mild perihilar vascular congestion without overt pulmonary edema. 2. No other  active cardiopulmonary disease.   Cardiac Studies   Echocardiogram 11/07/2019 1. Left ventricular ejection fraction, by estimation, is 20 to 25%. The  left ventricle has severely decreased function. The left ventricle  demonstrates global hypokinesis. The left ventricular internal cavity size  was mildly dilated. There is mild left  ventricular hypertrophy. Indeterminate diastolic filling due to E-A  fusion.  2. Right ventricular systolic function is moderately reduced. The right  ventricular size is normal. There is mildly elevated pulmonary artery  systolic pressure. The estimated right ventricular systolic pressure is  44.3 mmHg.  3. Left atrial size was mildly dilated.  4. The mitral valve is normal in structure. Mild mitral valve  regurgitation. No evidence of mitral stenosis.  5. The aortic valve is tricuspid. Aortic valve regurgitation is not  visualized. No aortic stenosis is present.  6. The inferior vena cava is normal in size with <50% respiratory  variability, suggesting right atrial pressure of 8 mmHg.    Patient Profile     34 y.o. female with history of poorly controlled diabetes, hypertensive heart disease with history of diastolic and systolic CHF, nonischemic cardiomyopathy ejection fraction 20%, presented with worsening shortness of breath -Ran out of her medications several weeks  Assessment & Plan    Acute on chronic diastolic and systolic CHF Possible exacerbation after COVID-19 infection Ran out of her medications several weeks, now back on her regiment Echocardiogram ejection fraction 20 to 25% -Close to 8 L negative Followed by per medicine [facility]S VEST requested, score of 29 today -Medications titrated back on including carvedilol 6.25 twice daily, digoxin 0.125 daily, Entresto 24/26 p.o. twice daily, spironolactone 25 -In the past is taking Lasix 20 as needed for any weight gain ankle swelling -If she has loss of insurance again recommended she  call our office, we will help get her established in medical management across the street from the hospital -Suggest that she also look at the prices of her medications on good [email or web address] She has  talked to case manager this admission  Dilated cardiomyopathy Felt secondary to poorly controlled diabetes, hypertensive heart disease We will arrange follow-up in the heart failure clinic, Long discussion with her, suggested she also use our office as needed in between visits  Poorly controlled insulin-dependent diabetes Recent A1c 13.8 Stressed importance of medication compliance with her insulin Some financial issues, recently lost insurance, now has Medicaid   Long discussion with her concerning medication compliance, fluid intake, salt intake/diet  Total encounter time more than 35 minutes  Greater than 50% was spent in counseling and coordination of care with the patient   For questions or updates, please contact [facility] Please consult [email or web address] for contact info under        Signed, Julien Nordmann, MD  11/10/2019, 2:03 PM

## 2019-11-10 NOTE — Discharge Summary (Signed)
Physician Discharge Summary  Cheryl Mercer:[id number] DOB: [birth year] DOA: 11/07/2019  PCP: Lorelee Market, MD  Admit date: 11/07/2019 Discharge date: 11/10/2019  Admitted From: home Disposition: home  Recommendations for Outpatient Follow-up:  1. Follow up with PCP in 1-2 weeks 2. F/u heart failure clinic on 11/14/19 3. F/u cardio in 1 week  Home Health: no Equipment/Devices  Discharge Condition: stable CODE STATUS: full  Diet recommendation: Heart Healthy / Carb Modified   Brief/Interim Summary: HPI was taken from Dr. Damita Dunnings: Cheryl Mercer is a 34 y.o. female with medical history significant for chronic combined systolic and diastolic CHF, last EF 10 to 15%, with poor outpatient follow-up, diabetes mellitus, hypertension, hyperlipidemia and history of hospitalization for COVID-19 pneumonia on 09/06/2019, who presents to the emergency room with a several day history of shortness of breath, lower extremity edema and orthopnea.  She denies chest pain, nausea vomiting or diaphoresis.  Has been out of his Lasix for two weeks. Says she is compliant with her other meds  ED Course: In the emergency room she was tachycardic at 126, tachypneic at 26, BP 139/105 and afebrile with O2 sat 96% on room air.  First troponin 43, BNP pending.  EKG with no acute ST-T wave changes, chest x-ray showed mild perihilar congestion and cardiomegaly, no overt edema.  Blood work otherwise unremarkable except for low sodium of 132.  Patient was given a dose of Lasix.  Hospitalist consulted for admission  Hospital Course from Dr. Lenise Herald 3/24-3/25/21: Pt was found to have acute on chronic combined CHF exacerbation which was treated w/ lasix, coreg, digoxin, entresto, spironolactone. Pt diuresed relatively well. Also, of note, pt's insulin pen needles were directly called into the pharmacy on day of d/c to make sure that the pt can afford  the insulin needles. Pt did receive CHF education  and will f/u w/ heart failure clinic on 11/14/19 as per cardio. Pt verbalized her understanding. For more information, please see previous progress notes.   Discharge Diagnoses:  Principal Problem:   Acute on chronic combined systolic and diastolic CHF (congestive heart failure) (HCC) Active Problems:   Uncontrolled type 2 diabetes mellitus with hyperglycemia (HCC)   Essential hypertension   History of COVID-19   Acute on chronic combined systolic (congestive) and diastolic (congestive) heart failure (HCC)   Elevated troponin   Dilated cardiomyopathy (HCC)  Acute on chronic combined CHF exacerbation: w/ nonischemic cardiomyopathy. Last EF 10 to 15%.  TTE during current admission showed LVEF 20-25%. Poor compliance with medication and follow-up for social/financial reasons. Continue on  lasix, coreg, digoxin, entresto, spironolactone. Monitor I/Os. Cardio following and recs apprec  Elevated troponin: secondary to demand ischemia from heart failure exacerbation. No chest pain and EKG is nonacute  DM2: with hyperglycemia. Uncontrolled w/ HbA1c 13.8. No insulin for at least 2 months because she couldn't afford the needles for the insulin pens. Continue on Lantus, aspart & SSI w/ accuchecks.   Essential hypertension: continue coreg, lasix, entresto as well as spironolactone    History of COVID-19: No acute concerns at this time  Discharge Instructions  Discharge Instructions    AMB referral to CHF clinic   Complete by: As directed    Amb Referral to Cardiac Rehabilitation   Complete by: As directed    Diagnosis: Heart Failure (see criteria below if ordering Phase II)   Heart Failure Type: Chronic Systolic & Diastolic   After initial evaluation and assessments completed: Virtual Based Care may be provided alone or  in conjunction with Phase 2 Cardiac Rehab based on patient barriers.: Yes   Diet - low sodium heart healthy   Complete by: As directed    Diet Carb Modified   Complete by:  As directed    Discharge instructions   Complete by: As directed    F/u PCP in 1 week; F/u heart failure clinic on 11/14/19; F/u cardio in 1 week   Increase activity slowly   Complete by: As directed      Allergies as of 11/10/2019      Reactions   Ibuprofen Anaphylaxis      Medication List    TAKE these medications   atorvastatin 40 MG tablet Commonly known as: LIPITOR Take 1 tablet (40 mg total) by mouth daily at 6 PM.   blood glucose meter kit and supplies Dispense based on patient and insurance preference. Check blood sugar 4 times daily, three times before meals and once before bedtime. (FOR ICD-10 E10.9, E11.9).   carvedilol 6.25 MG tablet Commonly known as: COREG Take 1 tablet (6.25 mg total) by mouth 2 (two) times daily with a meal.   cyclobenzaprine 10 MG tablet Commonly known as: FLEXERIL Take 10 mg by mouth 3 (three) times daily as needed for muscle spasms.   digoxin 0.125 MG tablet Commonly known as: LANOXIN Take 1 tablet (0.125 mg total) by mouth daily.   empagliflozin 10 MG Tabs tablet Commonly known as: JARDIANCE Take 10 mg by mouth daily.   furosemide 20 MG tablet Commonly known as: Lasix Take 1 tablet (20 mg total) by mouth daily.   furosemide 40 MG tablet Commonly known as: Lasix Take 1 tablet (40 mg total) by mouth daily as needed (for 3 pound weight gain).   gabapentin 300 MG capsule Commonly known as: NEURONTIN Take 1 capsule (300 mg total) by mouth 3 (three) times daily.   insulin glargine 100 UNIT/ML Solostar Pen Commonly known as: LANTUS Inject 40 Units into the skin daily.   metFORMIN 500 MG tablet Commonly known as: GLUCOPHAGE Take 1 tablet (500 mg total) by mouth 2 (two) times daily with a meal.   NovoLOG FlexPen 100 UNIT/ML FlexPen Generic drug: insulin aspart Inject 10 Units into the skin 3 (three) times daily with meals.   PARoxetine 10 MG tablet Commonly known as: PAXIL Take 1 tablet (10 mg total) by mouth daily.    rizatriptan 10 MG tablet Commonly known as: MAXALT Take 1 tablet (10 mg total) by mouth as needed for migraine. May repeat in 2 hours if needed   sacubitril-valsartan 24-26 MG Commonly known as: ENTRESTO Take 1 tablet by mouth 2 (two) times daily.   spironolactone 25 MG tablet Commonly known as: ALDACTONE Take 1 tablet (25 mg total) by mouth daily.      Follow-up Information    [facility] Follow up on 11/14/2019.   Specialty: Cardiology Why: at 2:30pm. Enter through the [facility] entrance Contact information: [address] [place] [address] [phone]         Allergies  Allergen Reactions  . Ibuprofen Anaphylaxis    Consultations:  cardio   Procedures/Studies: DG Chest 1 View  Result Date: 11/07/2019 CLINICAL DATA:  Initial evaluation for acute shortness of breath. EXAM: CHEST  1 VIEW COMPARISON:  Prior radiograph from 09/10/2019. FINDINGS: Cardiomegaly, stable.  Mediastinal silhouette within normal limits. Lungs normally inflated. Perihilar vascular congestion without overt pulmonary edema. No pleural effusion. No consolidative airspace disease. No   pneumothorax. No acute osseous finding. IMPRESSION: 1. Cardiomegaly with mild perihilar vascular congestion without overt pulmonary edema. 2. No other active cardiopulmonary disease. Electronically Signed   By: Benjamin  McClintock M.D.   On: 11/07/2019 03:36   ECHOCARDIOGRAM COMPLETE  Result Date: 11/08/2019    ECHOCARDIOGRAM REPORT   Patient Name:   Cheryl Mercer Date of Exam: 11/07/2019 Medical Rec #:  [id number]              Height:       61.0 in Accession #:    [id number]             Weight:       163.0 lb Date of Birth:  09/28/1985             BSA:          1.731 m Patient Age:    33 years               BP:           125/85 mmHg Patient Gender: F                      HR:           95 bpm. Exam Location:  [facility] Procedure: 2D Echo,  Cardiac Doppler and Color Doppler Indications:     CHF 428.21  History:         Patient has prior history of Echocardiogram examinations. Risk                  Factors:Diabetes. Pregnancy induced                  Pregnancy induced HTN.  Sonographer:     Christy Roar Referring Phys:  [id number] TINA LAI Diagnosing Phys: Christopher End MD IMPRESSIONS  1. Left ventricular ejection fraction, by estimation, is 20 to 25%. The left ventricle has severely decreased function. The left ventricle demonstrates global hypokinesis. The left ventricular internal cavity size was mildly dilated. There is mild left ventricular hypertrophy. Indeterminate diastolic filling due to E-A fusion.  2. Right ventricular systolic function is moderately reduced. The right ventricular size is normal. There is mildly elevated pulmonary artery systolic pressure. The estimated right ventricular systolic pressure is 35.5 mmHg.  3. Left atrial size was mildly dilated.  4. The mitral valve is normal in structure. Mild mitral valve regurgitation. No evidence of mitral stenosis.  5. The aortic valve is tricuspid. Aortic valve regurgitation is not visualized. No aortic stenosis is present.  6. The inferior vena cava is normal in size with <50% respiratory variability, suggesting right atrial pressure of 8 mmHg. FINDINGS  Left Ventricle: Left ventricular ejection fraction, by estimation, is 20 to 25%. The left ventricle has severely decreased function. The left ventricle demonstrates global hypokinesis. The left ventricular internal cavity size was mildly dilated. There is mild left ventricular hypertrophy. Indeterminate diastolic filling due to E-A fusion. Right Ventricle: The right ventricular size is normal. No increase in right ventricular wall thickness. Right ventricular systolic function is moderately reduced. There is mildly elevated pulmonary artery systolic pressure. The tricuspid regurgitant velocity is 2.62 m/s, and with an assumed right atrial  pressure of 8 mmHg, the estimated right ventricular systolic pressure is 35.5 mmHg. Left Atrium: Left atrial size was mildly dilated. Right Atrium: Right atrial size was normal in size. Pericardium: There is no evidence of pericardial effusion. Mitral Valve: The mitral valve is normal in structure. Mild   mitral valve regurgitation. No evidence of mitral valve stenosis. Tricuspid Valve: The tricuspid valve is normal in structure. Tricuspid valve regurgitation is mild. Aortic Valve: The aortic valve is tricuspid. Aortic valve regurgitation is not visualized. No aortic stenosis is present. Aortic valve mean gradient measures 2.0 mmHg. Aortic valve peak gradient measures 4.0 mmHg. Aortic valve area, by VTI measures 1.50 cm. Pulmonic Valve: The pulmonic valve was normal in structure. Pulmonic valve regurgitation is mild. No evidence of pulmonic stenosis. Aorta: The aortic root is normal in size and structure. Pulmonary Artery: The pulmonary artery is not well seen. Venous: The inferior vena cava is normal in size with less than 50% respiratory variability, suggesting right atrial pressure of 8 mmHg. IAS/Shunts: No atrial level shunt detected by color flow Doppler.  LEFT VENTRICLE PLAX 2D LVIDd:         5.39 cm      Diastology LVIDs:         4.81 cm      LV e' lateral:   6.20 cm/s LV PW:         1.15 cm      LV E/e' lateral: 16.0 LV IVS:        1.07 cm      LV e' medial:    5.66 cm/s LVOT diam:     1.80 cm      LV E/e' medial:  17.5 LV SV:         19 LV SV Index:   11 LVOT Area:     2.54 cm  LV Volumes (MOD) LV vol d, MOD A2C: 121.0 ml LV vol d, MOD A4C: 119.0 ml LV vol s, MOD A2C: 93.9 ml LV vol s, MOD A4C: 94.0 ml LV SV MOD A2C:     27.1 ml LV SV MOD A4C:     119.0 ml LV SV MOD BP:      25.5 ml RIGHT VENTRICLE RV Mid diam:    3.71 cm RV S prime:     8.81 cm/s LEFT ATRIUM             Index       RIGHT ATRIUM           Index LA diam:        4.10 cm 2.37 cm/m  RA Area:     15.20 cm LA Vol (A2C):   64.0 ml 36.96 ml/m RA  Volume:   44.10 ml  25.47 ml/m LA Vol (A4C):   64.8 ml 37.42 ml/m LA Biplane Vol: 65.7 ml 37.94 ml/m  AORTIC VALVE                   PULMONIC VALVE AV Area (Vmax):    1.32 cm    PV Vmax:        0.62 m/s AV Area (Vmean):   1.46 cm    PV Peak grad:   1.6 mmHg AV Area (VTI):     1.50 cm    RVOT Peak grad: 1 mmHg AV Vmax:           100.00 cm/s AV Vmean:          66.400 cm/s AV VTI:            0.124 m AV Peak Grad:      4.0 mmHg AV Mean Grad:      2.0 mmHg LVOT Vmax:         51.70 cm/s LVOT Vmean:          38.000 cm/s LVOT VTI:          0.073 m LVOT/AV VTI ratio: 0.59  AORTA Ao Root diam: 2.60 cm MITRAL VALVE               TRICUSPID VALVE MV Area (PHT): 7.16 cm    TR Peak grad:   27.5 mmHg MV Decel Time: 106 msec    TR Vmax:        262.00 cm/s MV E velocity: 99.00 cm/s MV A velocity: 56.30 cm/s  SHUNTS MV E/A ratio:  1.76        Systemic VTI:  0.07 m                            Systemic Diam: 1.80 cm Nelva Bush MD Electronically signed by Nelva Bush MD Signature Date/Time: 11/08/2019/7:49:46 AM    Final      Subjective: Pt c/o fatigue  Discharge Exam: Vitals:   11/10/19 0756 11/10/19 1158  BP: (!) 122/92 103/67  Pulse: (!) 101 95  Resp: 18 18  Temp: 98.4 F (36.9 C)   SpO2: 98% 100%   Vitals:   11/10/19 0034 11/10/19 0540 11/10/19 0756 11/10/19 1158  BP: 125/86  (!) 122/92 103/67  Pulse: 96  (!) 101 95  Resp: _0 Temp: 97.8 F (36.6 C)  98.4 F (36.9 C)   TempSrc:   Oral   SpO2: 98%  98% 100%  Weight:  63.8 kg    Height:        General: Pt is alert, awake, not in acute distress Cardiovascular:  S1/S2 +, no rubs, no gallops Respiratory: decreased breath sounds b/l, no wheezing, no rhonchi Abdominal: Soft, NT, ND, bowel sounds + Extremities:  no cyanosis    The results of significant diagnostics from this hospitalization (including imaging, microbiology, ancillary and laboratory) are listed below for reference.     Microbiology: No results found for this or any  previous visit (from the past 240 hour(s)).   Labs: BNP (last 3 results) Recent Labs    03/22/19 2154 09/06/19 2105 11/07/19 0307  BNP 962.3* 753.0* 706.2*   Basic Metabolic Panel: Recent Labs  Lab 11/07/19 0307 11/08/19 0542 11/09/19 0551 11/10/19 0627  NA 132* 134* 136 136  K 3.9 3.0* 3.7 4.3  CL 97* 97* 100 101  CO2 _1 GLUCOSE 490* 299* 304* 326*  BUN _2 CREATININE 0.55 0.49 0.51 0.59  CALCIUM 8.6* 7.9* 7.9* 8.2*  MG  --  1.5* 1.7 1.6*   Liver Function Tests: Recent Labs  Lab 11/07/19 0307  AST 36  ALT 15  ALKPHOS 126  BILITOT 0.6  PROT 6.8  ALBUMIN 2.8*   No results for input(s): LIPASE, AMYLASE in the last 168 hours. No results for input(s): AMMONIA in the last 168 hours. CBC: Recent Labs  Lab 11/07/19 0307 11/08/19 0542 11/09/19 0551 11/10/19 0627  WBC 10.7* 9.2 8.6 8.5  HGB 12.5 14.0 13.7 14.2  HCT 37.8 41.4 41.3 43.3  MCV 89.4 87.5 88.1 90.2  PLT 330 391 383 371   Cardiac Enzymes: No results for input(s): CKTOTAL, CKMB, CKMBINDEX, TROPONINI in the last 168 hours. BNP: Invalid input(s): POCBNP CBG: Recent Labs  Lab 11/09/19 1136 11/09/19 1652 11/09/19 2144 11/10/19 0758 11/10/19 1159  GLUCAP 183* 259* 105* 374* 90   D-Dimer No results for input(s): DDIMER in the last 72 hours. Hgb A1c  No results for input(s): HGBA1C in the last 72 hours. Lipid Profile No results for input(s): CHOL, HDL, LDLCALC, TRIG, CHOLHDL, LDLDIRECT in the last 72 hours. Thyroid function studies No results for input(s): TSH, T4TOTAL, T3FREE, THYROIDAB in the last 72 hours.  Invalid input(s): FREET3 Anemia work up No results for input(s): VITAMINB12, FOLATE, FERRITIN, TIBC, IRON, RETICCTPCT in the last 72 hours. Urinalysis    Component Value Date/Time   COLORURINE YELLOW (A) 09/06/2019 2012   APPEARANCEUR CLEAR (A) 09/06/2019 2012   LABSPEC 1.030 09/06/2019 2012   PHURINE 7.0 09/06/2019 2012   GLUCOSEU >=500 (A) 09/06/2019 2012    HGBUR SMALL (A) 09/06/2019 2012   BILIRUBINUR NEGATIVE 09/06/2019 2012   KETONESUR NEGATIVE 09/06/2019 2012   PROTEINUR 100 (A) 09/06/2019 2012   UROBILINOGEN 0.2 02/04/2019 1720   NITRITE NEGATIVE 09/06/2019 2012   LEUKOCYTESUR NEGATIVE 09/06/2019 2012   Sepsis Labs Invalid input(s): PROCALCITONIN,  WBC,  LACTICIDVEN Microbiology No results found for this or any previous visit (from the past 240 hour(s)).   Time coordinating discharge: Over 30 minutes  SIGNED:   Jamiese M Williams, MD  [facility] 11/10/2019, 3:04 PM Pager   If 7PM-7AM, please contact night-coverage [email or web address]    

## 2019-11-10 NOTE — Progress Notes (Signed)
Written and verbal discharge instructions discussed with pt. Discussed medication, follow-up appointments, HF and DM education and when to call MD or return to ER. Pt verbalized understanding. IV dc'd. Belongings with pt. Pt to car via wheelchair by tech.

## 2019-11-10 NOTE — TOC Initial Note (Signed)
Transition of Care [facility]) - Initial/Assessment Note    Patient Details  Name: Cheryl Mercer MRN: [id number] Date of Birth: 06-25-1986  Transition of Care [facility]) CM/SW Contact:    Shelbie Ammons, RN Phone Number: 11/10/2019, 10:19 AM  Clinical Narrative:    RNCM assessed patient at bedside, patient is from home, independent. She reports that she needs help getting her medicines because she usually can't afford her needles for her insulin pins so she hasn't been taking her insulin very often.  RNCM placed call to [facility] and spoke with pharmacy tech, they report that they just need a script sent over for the needles and they will be able to fill under her Medicaid.  RNCM contacted attending to send script.  RNCM notified patient of same.         Expected Discharge Plan: Home/Self Care Barriers to Discharge: Barriers Resolved   Patient Goals and CMS Choice Patient states their goals for this hospitalization and ongoing recovery are:: I want to get home and be able to get my medicine      Expected Discharge Plan and Services Expected Discharge Plan: Home/Self Care   Discharge Planning Services: CM Consult   Living arrangements for the past 2 months: Single Family Home                                      Prior Living Arrangements/Services Living arrangements for the past 2 months: Single Family Home Lives with:: Spouse Patient language and need for interpreter reviewed:: Yes Do you feel safe going back to the place where you live?: Yes      Need for Family Participation in Patient Care: Yes (Comment) Care giver support system in place?: Yes (comment)   Criminal Activity/Legal Involvement Pertinent to Current Situation/Hospitalization: No - Comment as needed  Activities of Daily Living Home Assistive Devices/Equipment: None ADL Screening (condition at time of admission) Patient's cognitive ability adequate to safely complete daily activities?: Yes Is the patient  deaf or have difficulty hearing?: No Does the patient have difficulty seeing, even when wearing glasses/contacts?: No Does the patient have difficulty concentrating, remembering, or making decisions?: No Patient able to express need for assistance with ADLs?: Yes Does the patient have difficulty dressing or bathing?: No Independently performs ADLs?: Yes (appropriate for developmental age) Does the patient have difficulty walking or climbing stairs?: Yes Weakness of Legs: Both Weakness of Arms/Hands: None  Permission Sought/Granted                  Emotional Assessment Appearance:: Appears stated age Attitude/Demeanor/Rapport: Engaged Affect (typically observed): Appropriate Orientation: : Oriented to Self, Oriented to Place, Oriented to  Time, Oriented to Situation Alcohol / Substance Use: Not Applicable Psych Involvement: No (comment)  Admission diagnosis:  Acute on chronic combined systolic and diastolic heart failure (HCC) [I50.43] Acute on chronic combined systolic (congestive) and diastolic (congestive) heart failure ([place]) [I50.43] Patient Active Problem List   Diagnosis Date Noted  . History of COVID-19 11/07/2019  . Acute on chronic combined systolic (congestive) and diastolic (congestive) heart failure ([place]) 11/07/2019  . Elevated troponin 11/07/2019  . Viral pneumonia   . Tachycardia   . Multifocal pneumonia 09/07/2019  . Chronic combined systolic and diastolic congestive heart failure ([place]) 09/07/2019  . Dyslipidemia 09/07/2019  . Back pain 09/07/2019  . Atypical pneumonia 09/07/2019  . Essential hypertension 03/24/2019  . Uncontrolled type 2 diabetes mellitus  with hyperglycemia (HCC) 03/23/2019  . Peripheral edema 03/23/2019  . Cardiomegaly 03/23/2019  . Acute on chronic combined systolic and diastolic CHF (congestive heart failure) (HCC) 03/23/2019  . Proteinuria 03/23/2019  . Acute bacterial tonsillitis 05/18/2013    Class: Acute  . UTI (lower urinary  tract infection) 07/30/2012   PCP:  Evelene Croon, MD Pharmacy:   [phone] [address] [place] - [address] [place] [address] Phone: [phone] Fax: [phone]     Social Determinants of Health (SDOH) Interventions    Readmission Risk Interventions No flowsheet data found.

## 2019-11-10 NOTE — Progress Notes (Signed)
CHF education provided to patient: -daily weights (pt has a scale at home) and when to call MD regarding weight gain -S&S to report to MD (knowing her zones) -dietary restrictions (salt) -fluid restrictions (1.5-2 L/day) -increasing activity as tolerated -diruetics (pt denied any difficulty affording or obtaining medications) -follow-up appointments with HF clinic (pt denies any transportation barriers)  Pt education booklet provided with zones magnet.  EMMI videos offered, but declined by pt.  All of pt's questions addressed. Understanding validated through teach back.  

## 2019-11-12 NOTE — Progress Notes (Deleted)
   Patient ID: Cheryl Mercer, female    DOB: 1986/05/22, 34 y.o.   MRN: [id number]  HPI  Cheryl Mercer is a 34 y/o female with a history of  Echo report from 11/07/19 reviewed and showed an EF of 20-25% along with a PA pressure of 35.5 mmHG and mild MR.   Catheterization done 03/25/2019 showed: Ao = 122/88 (104) LV = 125/15 RA = 2 RV = 35/6 PA = 37/14 (25) PCW = 18 Fick cardiac output/index = 5.3/3.2 SVR = 1543 PVR = 1.3 WU Ao sat = 99% PA sat = 74%, 76% Assessment: 1. Normal coronary arteries 2. Severe NICM EF 15%  Admitted 11/07/19 due to acute on chronic HF. Cardiology consult obtained. Initially given IV lasix and then transitioned to oral diuretics. Elevated troponin thought to be due to demand ischemia. Discharged after 3 days. Was in the ED 09/10/19 due to left shoulder pain where she was treated and released.   She presents today for her initial visit with a chief complaint of  Review of Systems    Physical Exam  Assessment & Plan:  1: Chronic heart failure with reduced ejection fraction- - NYHA class - has been previously seen at [facility] in [place]; last seen September 2020 - BNP 11/07/19 was 965.0  2: HTN- - BP - BMP 11/10/19 reviewed and showed sodium 136, potassium 4.3, creatinine 0.59 and GFR >60  3: DM- - A1c 11/07/19 was 13.8%

## 2019-11-14 ENCOUNTER — Telehealth: Payer: Self-pay | Admitting: Family

## 2019-11-14 ENCOUNTER — Ambulatory Visit: Payer: Medicaid Other | Admitting: Family

## 2019-11-14 NOTE — Telephone Encounter (Signed)
Patient did not show for her Heart Failure Clinic appointment on 11/14/19. Will attempt to reschedule.  

## 2019-11-15 ENCOUNTER — Telehealth: Payer: Self-pay | Admitting: *Deleted

## 2019-11-15 NOTE — Telephone Encounter (Signed)
Spoke with patient and she was not aware of appointment with HF clinic. She denies any weight gain, received her medications, and has not yet been scheduled for any appointments. She has moved back to [place] and would like to see providers here at [facility] with Dr. Mariah Milling and HF clinic upstairs. Advised that I would send messages to scheduling to have them reach out to her and get these set up. Instructed her to please call back if she does not get 2 appointments scheduled. She verbalized understanding with no further questions at this time.

## 2019-11-15 NOTE — Telephone Encounter (Signed)
-----   Message from Cheryl Iba, MD sent at 11/13/2019 12:57 PM EDT ----- can we see if this patient would like to follow-up in our clinic or with [email or web address] or both She has a CHF clinic with DNR so we would need to alternate visits with her Also can we confirm that she is back on her medications, has access to the medications as there were some insurance issues, is she watching her weight is her weight stable since she has been home Thx TG ----- Message ----- From: Dolores Patty, MD Sent: 11/12/2019   4:51 PM EDT To: Cheryl Iba, MD, Delma Freeze, FNP  Thanks tim. Great job!  Happy to see her to help as well if she wants  ----- Message ----- From: Cheryl Iba, MD Sent: 11/10/2019   2:18 PM EDT To: Dolores Patty, MD, Delma Freeze, FNP  Alonza Smoker, Had the opportunity to meet Ms. Holtry.  She was in for heart failure, admit [facility] 2.  Ran out of her meds past several weeks due to insurance issues,  Now reports husband has a job and she is back on insurance Should be able to get her medications 8 L off, reds vest 43 down to 29 feels great Did not change many of her medications just put her back on previous regiment which seemed to be working well Do not know if you want her to follow-up with you in clinic. Happy to help in the [place] office in between your visits if needed  Scheduled to see Inetta Fermo  CHF clinic March 29  Thx Tg

## 2019-11-16 ENCOUNTER — Telehealth: Payer: Self-pay | Admitting: Family

## 2019-11-16 NOTE — Telephone Encounter (Signed)
Called patient 3 times on both cell phone and home phone and unable to reach or lvm on either phone in attempt to reschedule her missed New patient CHF Clinic appointment.    Deetta Perla, [place]

## 2019-11-17 NOTE — Telephone Encounter (Signed)
Attempted to schedule no ans no vm Needs new patient appt with [facility]

## 2019-11-21 NOTE — Telephone Encounter (Signed)
Attempted to schedule no ans no vm  

## 2019-12-14 NOTE — Telephone Encounter (Signed)
Attempted to schedule no ans no vm  

## 2019-12-16 ENCOUNTER — Emergency Department: Payer: Medicaid Other

## 2019-12-16 ENCOUNTER — Other Ambulatory Visit: Payer: Self-pay

## 2019-12-16 ENCOUNTER — Inpatient Hospital Stay
Admission: EM | Admit: 2019-12-16 | Discharge: 2019-12-21 | DRG: 175 | Disposition: A | Discharge status: Home / Self Care | Payer: Medicaid Other | Attending: Internal Medicine | Admitting: Internal Medicine

## 2019-12-16 ENCOUNTER — Encounter: Payer: Self-pay | Admitting: Emergency Medicine

## 2019-12-16 DIAGNOSIS — J189 Pneumonia, unspecified organism: Secondary | ICD-10-CM

## 2019-12-16 DIAGNOSIS — J9601 Acute respiratory failure with hypoxia: Secondary | ICD-10-CM | POA: Diagnosis not present

## 2019-12-16 DIAGNOSIS — Z8701 Personal history of pneumonia (recurrent): Secondary | ICD-10-CM | POA: Diagnosis not present

## 2019-12-16 DIAGNOSIS — G43909 Migraine, unspecified, not intractable, without status migrainosus: Secondary | ICD-10-CM | POA: Diagnosis present

## 2019-12-16 DIAGNOSIS — Z886 Allergy status to analgesic agent status: Secondary | ICD-10-CM | POA: Diagnosis not present

## 2019-12-16 DIAGNOSIS — R071 Chest pain on breathing: Secondary | ICD-10-CM

## 2019-12-16 DIAGNOSIS — E108 Type 1 diabetes mellitus with unspecified complications: Secondary | ICD-10-CM

## 2019-12-16 DIAGNOSIS — N390 Urinary tract infection, site not specified: Secondary | ICD-10-CM | POA: Diagnosis not present

## 2019-12-16 DIAGNOSIS — I110 Hypertensive heart disease with heart failure: Secondary | ICD-10-CM | POA: Diagnosis present

## 2019-12-16 DIAGNOSIS — I2693 Single subsegmental pulmonary embolism without acute cor pulmonale: Principal | ICD-10-CM | POA: Diagnosis present

## 2019-12-16 DIAGNOSIS — R0781 Pleurodynia: Secondary | ICD-10-CM | POA: Diagnosis not present

## 2019-12-16 DIAGNOSIS — R0681 Apnea, not elsewhere classified: Secondary | ICD-10-CM

## 2019-12-16 DIAGNOSIS — G9341 Metabolic encephalopathy: Secondary | ICD-10-CM | POA: Diagnosis not present

## 2019-12-16 DIAGNOSIS — I2694 Multiple subsegmental pulmonary emboli without acute cor pulmonale: Secondary | ICD-10-CM | POA: Diagnosis not present

## 2019-12-16 DIAGNOSIS — Z20822 Contact with and (suspected) exposure to covid-19: Secondary | ICD-10-CM | POA: Diagnosis present

## 2019-12-16 DIAGNOSIS — B948 Sequelae of other specified infectious and parasitic diseases: Secondary | ICD-10-CM

## 2019-12-16 DIAGNOSIS — Z9114 Patient's other noncompliance with medication regimen: Secondary | ICD-10-CM | POA: Diagnosis not present

## 2019-12-16 DIAGNOSIS — I420 Dilated cardiomyopathy: Secondary | ICD-10-CM | POA: Diagnosis not present

## 2019-12-16 DIAGNOSIS — Z794 Long term (current) use of insulin: Secondary | ICD-10-CM

## 2019-12-16 DIAGNOSIS — R0902 Hypoxemia: Secondary | ICD-10-CM | POA: Diagnosis not present

## 2019-12-16 DIAGNOSIS — R0782 Intercostal pain: Secondary | ICD-10-CM | POA: Diagnosis not present

## 2019-12-16 DIAGNOSIS — U071 COVID-19: Secondary | ICD-10-CM | POA: Diagnosis not present

## 2019-12-16 DIAGNOSIS — I2721 Secondary pulmonary arterial hypertension: Secondary | ICD-10-CM | POA: Diagnosis present

## 2019-12-16 DIAGNOSIS — R0602 Shortness of breath: Secondary | ICD-10-CM | POA: Diagnosis not present

## 2019-12-16 DIAGNOSIS — Z86711 Personal history of pulmonary embolism: Secondary | ICD-10-CM | POA: Diagnosis present

## 2019-12-16 DIAGNOSIS — G8929 Other chronic pain: Secondary | ICD-10-CM | POA: Diagnosis present

## 2019-12-16 DIAGNOSIS — M549 Dorsalgia, unspecified: Secondary | ICD-10-CM | POA: Diagnosis present

## 2019-12-16 DIAGNOSIS — R0789 Other chest pain: Secondary | ICD-10-CM | POA: Diagnosis not present

## 2019-12-16 DIAGNOSIS — E1165 Type 2 diabetes mellitus with hyperglycemia: Secondary | ICD-10-CM | POA: Diagnosis not present

## 2019-12-16 DIAGNOSIS — R000 Tachycardia, unspecified: Secondary | ICD-10-CM

## 2019-12-16 DIAGNOSIS — I2699 Other pulmonary embolism without acute cor pulmonale: Secondary | ICD-10-CM | POA: Diagnosis present

## 2019-12-16 DIAGNOSIS — E119 Type 2 diabetes mellitus without complications: Secondary | ICD-10-CM | POA: Diagnosis present

## 2019-12-16 DIAGNOSIS — T40601A Poisoning by unspecified narcotics, accidental (unintentional), initial encounter: Secondary | ICD-10-CM | POA: Diagnosis not present

## 2019-12-16 DIAGNOSIS — I5043 Acute on chronic combined systolic (congestive) and diastolic (congestive) heart failure: Secondary | ICD-10-CM | POA: Diagnosis not present

## 2019-12-16 DIAGNOSIS — I340 Nonrheumatic mitral (valve) insufficiency: Secondary | ICD-10-CM | POA: Diagnosis not present

## 2019-12-16 DIAGNOSIS — I5042 Chronic combined systolic (congestive) and diastolic (congestive) heart failure: Secondary | ICD-10-CM | POA: Diagnosis not present

## 2019-12-16 DIAGNOSIS — R52 Pain, unspecified: Secondary | ICD-10-CM | POA: Diagnosis not present

## 2019-12-16 DIAGNOSIS — Z8249 Family history of ischemic heart disease and other diseases of the circulatory system: Secondary | ICD-10-CM | POA: Diagnosis not present

## 2019-12-16 DIAGNOSIS — E876 Hypokalemia: Secondary | ICD-10-CM | POA: Diagnosis present

## 2019-12-16 DIAGNOSIS — I361 Nonrheumatic tricuspid (valve) insufficiency: Secondary | ICD-10-CM | POA: Diagnosis not present

## 2019-12-16 DIAGNOSIS — I10 Essential (primary) hypertension: Secondary | ICD-10-CM | POA: Diagnosis not present

## 2019-12-16 DIAGNOSIS — B9620 Unspecified Escherichia coli [E. coli] as the cause of diseases classified elsewhere: Secondary | ICD-10-CM | POA: Diagnosis present

## 2019-12-16 DIAGNOSIS — E871 Hypo-osmolality and hyponatremia: Secondary | ICD-10-CM | POA: Diagnosis not present

## 2019-12-16 DIAGNOSIS — E872 Acidosis: Secondary | ICD-10-CM | POA: Diagnosis not present

## 2019-12-16 DIAGNOSIS — R079 Chest pain, unspecified: Secondary | ICD-10-CM | POA: Diagnosis not present

## 2019-12-16 HISTORY — DX: Pneumonia, unspecified organism: J18.9

## 2019-12-16 HISTORY — DX: Other pulmonary embolism without acute cor pulmonale: I26.99

## 2019-12-16 HISTORY — DX: Heart failure, unspecified: I50.9

## 2019-12-16 LAB — RESPIRATORY PANEL BY RT PCR (FLU A&B, COVID)
Influenza A by PCR: NEGATIVE
Influenza B by PCR: NEGATIVE
SARS Coronavirus 2 by RT PCR: NEGATIVE

## 2019-12-16 LAB — BASIC METABOLIC PANEL
Anion gap: 12 (ref 5–15)
Anion gap: 13 (ref 5–15)
BUN: <5 mg/dL — ABNORMAL LOW (ref 6–20)
BUN: 7 mg/dL (ref 6–20)
CO2: 24 mmol/L (ref 22–32)
CO2: 24 mmol/L (ref 22–32)
Calcium: 8.6 mg/dL — ABNORMAL LOW (ref 8.9–10.3)
Calcium: 8.1 mg/dL — ABNORMAL LOW (ref 8.9–10.3)
Chloride: 91 mmol/L — ABNORMAL LOW (ref 98–111)
Chloride: 92 mmol/L — ABNORMAL LOW (ref 98–111)
Creatinine, Ser: 0.66 mg/dL (ref 0.44–1.00)
Creatinine, Ser: 0.73 mg/dL (ref 0.44–1.00)
GFR calc Af Amer: >60 mL/min (ref >60)
GFR calc Af Amer: >60 mL/min (ref >60)
GFR calc non Af Amer: >60 mL/min (ref >60)
GFR calc non Af Amer: >60 mL/min (ref >60)
Glucose, Bld: 490 mg/dL — ABNORMAL HIGH (ref 70–99)
Glucose, Bld: 254 mg/dL — ABNORMAL HIGH (ref 70–99)
Potassium: 3.6 mmol/L (ref 3.5–5.1)
Potassium: 3.2 mmol/L — ABNORMAL LOW (ref 3.5–5.1)
Sodium: 127 mmol/L — ABNORMAL LOW (ref 135–145)
Sodium: 129 mmol/L — ABNORMAL LOW (ref 135–145)

## 2019-12-16 LAB — URINALYSIS, COMPLETE (UACMP) WITH MICROSCOPIC
Bilirubin Urine: NEGATIVE
Glucose, UA: >=500 mg/dL — AB
Ketones, ur: 5 mg/dL — AB
Leukocytes,Ua: NEGATIVE
Nitrite: POSITIVE — AB
Protein, ur: >=300 mg/dL — AB
Specific Gravity, Urine: 1.032 — ABNORMAL HIGH (ref 1.005–1.030)
pH: 6 (ref 5.0–8.0)

## 2019-12-16 LAB — GLUCOSE, CAPILLARY
Glucose-Capillary: 581 mg/dL (ref 70–99)
Glucose-Capillary: >600 mg/dL (ref 70–99)
Glucose-Capillary: 411 mg/dL — ABNORMAL HIGH (ref 70–99)
Glucose-Capillary: 280 mg/dL — ABNORMAL HIGH (ref 70–99)

## 2019-12-16 LAB — CBC
HCT: 44.2 % (ref 36.0–46.0)
Hemoglobin: 15.3 g/dL — ABNORMAL HIGH (ref 12.0–15.0)
MCH: 29.8 pg (ref 26.0–34.0)
MCHC: 34.6 g/dL (ref 30.0–36.0)
MCV: 86 fL (ref 80.0–100.0)
Platelets: 339 10*3/uL (ref 150–400)
RBC: 5.14 MIL/uL — ABNORMAL HIGH (ref 3.87–5.11)
RDW: 13.2 % (ref 11.5–15.5)
WBC: 12.7 10*3/uL — ABNORMAL HIGH (ref 4.0–10.5)
nRBC: 0 % (ref 0.0–0.2)

## 2019-12-16 LAB — TROPONIN I (HIGH SENSITIVITY)
Troponin I (High Sensitivity): 19 ng/L — ABNORMAL HIGH (ref <18)
Troponin I (High Sensitivity): 19 ng/L — ABNORMAL HIGH (ref <18)

## 2019-12-16 LAB — LACTIC ACID, PLASMA
Lactic Acid, Venous: 2.3 mmol/L (ref 0.5–1.9)
Lactic Acid, Venous: 2.3 mmol/L (ref 0.5–1.9)

## 2019-12-16 LAB — POC SARS CORONAVIRUS 2 AG: SARS Coronavirus 2 Ag: NEGATIVE

## 2019-12-16 LAB — HEPARIN LEVEL (UNFRACTIONATED): Heparin Unfractionated: 0.48 IU/mL (ref 0.30–0.70)

## 2019-12-16 LAB — APTT: aPTT: 36 seconds (ref 24–36)

## 2019-12-16 LAB — BRAIN NATRIURETIC PEPTIDE: B Natriuretic Peptide: 938 pg/mL — ABNORMAL HIGH (ref 0.0–100.0)

## 2019-12-16 LAB — PROCALCITONIN: Procalcitonin: <0.1 ng/mL

## 2019-12-16 LAB — BETA-HYDROXYBUTYRIC ACID: Beta-Hydroxybutyric Acid: 0.08 mmol/L (ref 0.05–0.27)

## 2019-12-16 LAB — PROTIME-INR
INR: 1.3 — ABNORMAL HIGH (ref 0.8–1.2)
Prothrombin Time: 15.2 seconds (ref 11.4–15.2)

## 2019-12-16 LAB — POCT PREGNANCY, URINE: Preg Test, Ur: NEGATIVE

## 2019-12-16 LAB — CK: Total CK: 116 U/L (ref 38–234)

## 2019-12-16 LAB — MAGNESIUM: Magnesium: 1.5 mg/dL — ABNORMAL LOW (ref 1.7–2.4)

## 2019-12-16 LAB — GLUCOSE, RANDOM: Glucose, Bld: 651 mg/dL (ref 70–99)

## 2019-12-16 MED ORDER — SACUBITRIL-VALSARTAN 24-26 MG PO TABS
1.0000 | ORAL_TABLET | Freq: Two times a day (BID) | ORAL | Status: DC
  Administered 2019-12-16 – 2019-12-21 (×9): 1 via ORAL
  Filled 2019-12-16 (×12): qty 1

## 2019-12-16 MED ORDER — DIAZEPAM 5 MG PO TABS: 2.5000 mg | ORAL_TABLET | Freq: Once | ORAL | Status: DC

## 2019-12-16 MED ORDER — CARVEDILOL 6.25 MG PO TABS
6.2500 mg | ORAL_TABLET | Freq: Two times a day (BID) | ORAL | Status: DC
  Administered 2019-12-16 – 2019-12-21 (×8): 6.25 mg via ORAL
  Filled 2019-12-16 (×9): qty 1

## 2019-12-16 MED ORDER — INSULIN ASPART 100 UNIT/ML ~~LOC~~ SOLN
0.0000 [IU] | Freq: Every day | SUBCUTANEOUS | Status: DC
  Administered 2019-12-16 – 2019-12-17 (×2): 3 [IU] via SUBCUTANEOUS
  Filled 2019-12-16 (×2): qty 1

## 2019-12-16 MED ORDER — DIGOXIN 125 MCG PO TABS
0.1250 mg | ORAL_TABLET | Freq: Every day | ORAL | Status: DC
  Administered 2019-12-16 – 2019-12-21 (×6): 0.125 mg via ORAL
  Filled 2019-12-16 (×7): qty 1

## 2019-12-16 MED ORDER — INSULIN ASPART 100 UNIT/ML ~~LOC~~ SOLN
0.0000 [IU] | Freq: Three times a day (TID) | SUBCUTANEOUS | Status: DC
  Administered 2019-12-16: 15 [IU] via SUBCUTANEOUS
  Administered 2019-12-17: 3 [IU] via SUBCUTANEOUS
  Administered 2019-12-17: 8 [IU] via SUBCUTANEOUS
  Administered 2019-12-18 (×2): 2 [IU] via SUBCUTANEOUS
  Administered 2019-12-19: 5 [IU] via SUBCUTANEOUS
  Administered 2019-12-19: 2 [IU] via SUBCUTANEOUS
  Administered 2019-12-20: 5 [IU] via SUBCUTANEOUS
  Administered 2019-12-20 (×2): 3 [IU] via SUBCUTANEOUS
  Administered 2019-12-21: 5 [IU] via SUBCUTANEOUS
  Administered 2019-12-21: 3 [IU] via SUBCUTANEOUS
  Filled 2019-12-16 (×12): qty 1

## 2019-12-16 MED ORDER — SPIRONOLACTONE 25 MG PO TABS
25.0000 mg | ORAL_TABLET | Freq: Every day | ORAL | Status: DC
  Administered 2019-12-16 – 2019-12-21 (×5): 25 mg via ORAL
  Filled 2019-12-16 (×5): qty 1

## 2019-12-16 MED ORDER — ACETAMINOPHEN 325 MG PO TABS
650.0000 mg | ORAL_TABLET | ORAL | Status: DC | PRN
  Administered 2019-12-16 – 2019-12-21 (×7): 650 mg via ORAL
  Filled 2019-12-16 (×9): qty 2

## 2019-12-16 MED ORDER — SODIUM CHLORIDE 0.9 % IV SOLN: Freq: Once | INTRAVENOUS | Status: DC

## 2019-12-16 MED ORDER — MORPHINE SULFATE (PF) 4 MG/ML IV SOLN
4.0000 mg | Freq: Once | INTRAVENOUS | Status: AC
  Administered 2019-12-16: 4 mg via INTRAVENOUS
  Filled 2019-12-16: qty 1

## 2019-12-16 MED ORDER — IOHEXOL 350 MG/ML SOLN
75.0000 mL | Freq: Once | INTRAVENOUS | Status: AC | PRN
  Administered 2019-12-16: 75 mL via INTRAVENOUS

## 2019-12-16 MED ORDER — EMPAGLIFLOZIN 10 MG PO TABS: 10.0000 mg | ORAL_TABLET | Freq: Every day | ORAL | Status: DC

## 2019-12-16 MED ORDER — CYCLOBENZAPRINE HCL 10 MG PO TABS
10.0000 mg | ORAL_TABLET | Freq: Three times a day (TID) | ORAL | Status: DC | PRN
  Administered 2019-12-16: 10 mg via ORAL
  Filled 2019-12-16 (×2): qty 1

## 2019-12-16 MED ORDER — SODIUM CHLORIDE 0.9 % IV SOLN
500.0000 mg | INTRAVENOUS | Status: DC
  Administered 2019-12-17 – 2019-12-18 (×2): 500 mg via INTRAVENOUS
  Filled 2019-12-16 (×3): qty 500

## 2019-12-16 MED ORDER — LISINOPRIL 10 MG PO TABS
10.0000 mg | ORAL_TABLET | Freq: Every day | ORAL | Status: DC
Start: 2019-12-16 — End: 2019-12-16

## 2019-12-16 MED ORDER — GABAPENTIN 300 MG PO CAPS
300.0000 mg | ORAL_CAPSULE | Freq: Three times a day (TID) | ORAL | Status: DC
  Administered 2019-12-16 – 2019-12-21 (×16): 300 mg via ORAL
  Filled 2019-12-16 (×16): qty 1

## 2019-12-16 MED ORDER — SODIUM CHLORIDE 0.9% FLUSH: 3.0000 mL | INTRAVENOUS | Status: DC | PRN

## 2019-12-16 MED ORDER — PAROXETINE HCL 10 MG PO TABS
10.0000 mg | ORAL_TABLET | Freq: Every day | ORAL | Status: DC
  Administered 2019-12-16 – 2019-12-21 (×6): 10 mg via ORAL
  Filled 2019-12-16 (×7): qty 1

## 2019-12-16 MED ORDER — SODIUM CHLORIDE 0.9 % IV SOLN: Freq: Once | INTRAVENOUS | Status: AC

## 2019-12-16 MED ORDER — MAGNESIUM SULFATE 2 GM/50ML IV SOLN
2.0000 g | Freq: Once | INTRAVENOUS | Status: AC
  Administered 2019-12-16: 2 g via INTRAVENOUS
  Filled 2019-12-16: qty 50

## 2019-12-16 MED ORDER — SODIUM CHLORIDE 0.9 % IV SOLN
500.0000 mg | Freq: Once | INTRAVENOUS | Status: AC
  Administered 2019-12-16: 500 mg via INTRAVENOUS
  Filled 2019-12-16: qty 500

## 2019-12-16 MED ORDER — FUROSEMIDE 10 MG/ML IJ SOLN
40.0000 mg | Freq: Two times a day (BID) | INTRAMUSCULAR | Status: DC
  Administered 2019-12-16 – 2019-12-17 (×2): 40 mg via INTRAVENOUS
  Filled 2019-12-16 (×2): qty 4

## 2019-12-16 MED ORDER — SODIUM CHLORIDE 0.9 % IV SOLN
2.0000 g | INTRAVENOUS | Status: AC
  Administered 2019-12-17 – 2019-12-21 (×5): 2 g via INTRAVENOUS
  Filled 2019-12-16 (×5): qty 2

## 2019-12-16 MED ORDER — INSULIN ASPART 100 UNIT/ML ~~LOC~~ SOLN: 0.0000 [IU] | Freq: Three times a day (TID) | SUBCUTANEOUS | Status: DC

## 2019-12-16 MED ORDER — ONDANSETRON HCL 4 MG/2ML IJ SOLN: 4.0000 mg | Freq: Four times a day (QID) | INTRAMUSCULAR | Status: DC | PRN

## 2019-12-16 MED ORDER — FUROSEMIDE 40 MG PO TABS: 40.0000 mg | ORAL_TABLET | Freq: Every day | ORAL | Status: DC | PRN

## 2019-12-16 MED ORDER — HEPARIN SODIUM (PORCINE) 5000 UNIT/ML IJ SOLN
4000.0000 [IU] | Freq: Once | INTRAMUSCULAR | Status: DC
  Filled 2019-12-16: qty 1

## 2019-12-16 MED ORDER — HEPARIN BOLUS VIA INFUSION
4000.0000 [IU] | Freq: Once | INTRAVENOUS | Status: AC
  Administered 2019-12-16: 4000 [IU] via INTRAVENOUS
  Filled 2019-12-16: qty 4000

## 2019-12-16 MED ORDER — SODIUM CHLORIDE 0.9 % IV SOLN
1.0000 g | INTRAVENOUS | Status: DC
  Administered 2019-12-16: 1 g via INTRAVENOUS
  Filled 2019-12-16: qty 10

## 2019-12-16 MED ORDER — HEPARIN (PORCINE) 25000 UT/250ML-% IV SOLN
1400.0000 [IU]/h | INTRAVENOUS | Status: AC
  Administered 2019-12-16: 1050 [IU]/h via INTRAVENOUS
  Administered 2019-12-18: 1500 [IU]/h via INTRAVENOUS
  Filled 2019-12-16 (×3): qty 250

## 2019-12-16 MED ORDER — INSULIN GLARGINE 100 UNIT/ML ~~LOC~~ SOLN
40.0000 [IU] | Freq: Every day | SUBCUTANEOUS | Status: DC
  Administered 2019-12-16: 40 [IU] via SUBCUTANEOUS
  Filled 2019-12-16 (×2): qty 0.4

## 2019-12-16 MED ORDER — OXYCODONE HCL 5 MG PO TABS
5.0000 mg | ORAL_TABLET | Freq: Four times a day (QID) | ORAL | Status: DC | PRN
  Administered 2019-12-16 – 2019-12-19 (×6): 5 mg via ORAL
  Filled 2019-12-16 (×9): qty 1

## 2019-12-16 MED ORDER — ONDANSETRON HCL 4 MG/2ML IJ SOLN
4.0000 mg | Freq: Once | INTRAMUSCULAR | Status: AC
  Administered 2019-12-16: 4 mg via INTRAVENOUS
  Filled 2019-12-16: qty 2

## 2019-12-16 MED ORDER — MORPHINE SULFATE (PF) 4 MG/ML IV SOLN
4.0000 mg | Freq: Once | INTRAVENOUS | Status: DC
  Filled 2019-12-16: qty 1

## 2019-12-16 MED ORDER — INSULIN GLARGINE 100 UNIT/ML ~~LOC~~ SOLN
40.0000 [IU] | Freq: Every day | SUBCUTANEOUS | Status: DC
  Filled 2019-12-16 (×2): qty 0.4

## 2019-12-16 MED ORDER — POTASSIUM CHLORIDE IN NACL 20-0.9 MEQ/L-% IV SOLN
INTRAVENOUS | Status: DC
  Filled 2019-12-16 (×10): qty 1000

## 2019-12-16 MED ORDER — INSULIN ASPART 100 UNIT/ML ~~LOC~~ SOLN
10.0000 [IU] | Freq: Three times a day (TID) | SUBCUTANEOUS | Status: DC
  Administered 2019-12-16 – 2019-12-21 (×14): 10 [IU] via SUBCUTANEOUS
  Filled 2019-12-16 (×13): qty 1

## 2019-12-16 MED ORDER — SODIUM CHLORIDE 0.9 % IV SOLN
250.0000 mL | INTRAVENOUS | Status: DC | PRN
  Administered 2019-12-20: 250 mL via INTRAVENOUS

## 2019-12-16 MED ORDER — ATORVASTATIN CALCIUM 20 MG PO TABS
40.0000 mg | ORAL_TABLET | Freq: Every day | ORAL | Status: DC
  Administered 2019-12-16 – 2019-12-20 (×5): 40 mg via ORAL
  Filled 2019-12-16 (×4): qty 2
  Filled 2019-12-16: qty 4

## 2019-12-16 MED ORDER — SODIUM CHLORIDE 0.9% FLUSH
3.0000 mL | Freq: Two times a day (BID) | INTRAVENOUS | Status: DC
  Administered 2019-12-16 – 2019-12-21 (×9): 3 mL via INTRAVENOUS

## 2019-12-16 NOTE — ED Notes (Signed)
This RN contacted triage RN in request to have EKG from earlier logged into system. Larene Pickett states she will let Engineer, building services know.

## 2019-12-16 NOTE — Progress Notes (Signed)
ANTICOAGULATION CONSULT NOTE  Pharmacy Consult for heparin Indication: pulmonary embolus  Allergies  Allergen Reactions  . Ibuprofen Anaphylaxis    Patient Measurements: Height: 5\' 1"  (154.9 cm) Weight: 73.5 kg (162 lb) IBW/kg (Calculated) : 47.8 Heparin Dosing Weight: 64 kg  Vital Signs: Temp: 100.3 F (37.9 C) (04/30 1211) Temp Source: Oral (04/30 1211) BP: 143/108 (04/30 1347) Pulse Rate: 124 (04/30 1424)  Labs: Recent Labs    12/16/19 1217 12/16/19 1405  HGB 15.3*  --   HCT 44.2  --   PLT 339  --   CREATININE 0.66  --   TROPONINIHS 19* 19*    Estimated Creatinine Clearance: 92.6 mL/min (by C-G formula based on SCr of 0.66 mg/dL).   Medical History: Past Medical History:  Diagnosis Date  . CHF (congestive heart failure) (HCC)   . Chronic back pain   . Diabetes mellitus without complication (HCC)   . Migraines   . No pertinent past medical history   . Pregnancy induced hypertension     Assessment: 34 year old female presented with chest pain. Imaging with small subsegmental level pulmonary emboli in the posterior and lateral segments of the right lower lobe. Patient does not appear to be on any anticoagulation PTA. Pharmacy consulted to start heparin drip.  Goal of Therapy:  Heparin level 0.3-0.7 units/ml Monitor platelets by anticoagulation protocol: Yes   Plan:  Heparin 4000 unit bolus followed by heparin drip at 1050 units/hr. HL at 2200. CBC daily while on heparin drip.  34, PharmD 12/16/2019,3:27 PM

## 2019-12-16 NOTE — ED Notes (Signed)
Attempted report to floor x2. 
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
## 2019-12-16 NOTE — ED Notes (Signed)
Pt on phone with husband. Will collect covid swab once pt off phone.

## 2019-12-16 NOTE — ED Notes (Signed)
Pt back to room.

## 2019-12-16 NOTE — ED Notes (Signed)
Pt leaving for imaging.

## 2019-12-16 NOTE — Progress Notes (Addendum)
Blood sugar recheck-  >600.  Ordered a Stat lab blood glucose to confirm.     Glucose resulted- 651 MD notified

## 2019-12-16 NOTE — ED Notes (Signed)
Attempted report to floor.  

## 2019-12-16 NOTE — ED Notes (Signed)
Rash at L arm IV site looking much better. Still present but returning to appropriate skin color. Vein no longer raised. Pt thinks she has had zofran before but denies having had morphine before today.
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
## 2019-12-16 NOTE — ED Notes (Signed)
Site at L arm IV C/D/I. No rash present, vein not raised, and appropriate color to skin. Pt denies pain at site.

## 2019-12-16 NOTE — Progress Notes (Addendum)
Patient arrived to floor from ED with a  CBG of 581  Paged MD to get order for insulin and how much to be given.    MD ordered Sliding scale, meal coverage and long acting Lantus.    Will give 20 units novolog at this time and recheck in 15 minutes.
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
## 2019-12-16 NOTE — ED Notes (Signed)
Will capture repeat EKG as triage RN has yet to get previous EKG to link with E-chart.

## 2019-12-16 NOTE — ED Notes (Signed)
Pt given snacks and drink of choice. States inc in pain again. EDP Kinner notified. Bed locked low. Rails up. Call bell within reach.

## 2019-12-16 NOTE — ED Notes (Addendum)
Pt showing s/s of phlebitis at L ac IV site post zofran/morphine. Will keep monitoring site and remove IV if necessary. Pt initially c/o burning pain at site but denies any pain now. Red rash follows vein which is currently raised as it was not previously. EDP Kinner notified in person.
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
## 2019-12-16 NOTE — ED Notes (Signed)
EDP Kinner notified of Lactic 2.3.

## 2019-12-17 ENCOUNTER — Inpatient Hospital Stay: Payer: Medicaid Other

## 2019-12-17 DIAGNOSIS — I2699 Other pulmonary embolism without acute cor pulmonale: Secondary | ICD-10-CM

## 2019-12-17 DIAGNOSIS — I5042 Chronic combined systolic (congestive) and diastolic (congestive) heart failure: Secondary | ICD-10-CM | POA: Diagnosis not present

## 2019-12-17 DIAGNOSIS — N390 Urinary tract infection, site not specified: Secondary | ICD-10-CM

## 2019-12-17 DIAGNOSIS — E1165 Type 2 diabetes mellitus with hyperglycemia: Secondary | ICD-10-CM

## 2019-12-17 HISTORY — DX: Other pulmonary embolism without acute cor pulmonale: I26.99

## 2019-12-17 LAB — COMPREHENSIVE METABOLIC PANEL
ALT: 13 U/L (ref 0–44)
AST: 48 U/L — ABNORMAL HIGH (ref 15–41)
Albumin: 2.1 g/dL — ABNORMAL LOW (ref 3.5–5.0)
Alkaline Phosphatase: 104 U/L (ref 38–126)
Anion gap: 9 (ref 5–15)
BUN: 18 mg/dL (ref 6–20)
CO2: 22 mmol/L (ref 22–32)
Calcium: 7.3 mg/dL — ABNORMAL LOW (ref 8.9–10.3)
Chloride: 97 mmol/L — ABNORMAL LOW (ref 98–111)
Creatinine, Ser: 0.58 mg/dL (ref 0.44–1.00)
GFR calc Af Amer: >60 mL/min (ref >60)
GFR calc non Af Amer: >60 mL/min (ref >60)
Glucose, Bld: 269 mg/dL — ABNORMAL HIGH (ref 70–99)
Potassium: 3.5 mmol/L (ref 3.5–5.1)
Sodium: 128 mmol/L — ABNORMAL LOW (ref 135–145)
Total Bilirubin: 0.8 mg/dL (ref 0.3–1.2)
Total Protein: 5.8 g/dL — ABNORMAL LOW (ref 6.5–8.1)

## 2019-12-17 LAB — BASIC METABOLIC PANEL
Anion gap: 10 (ref 5–15)
BUN: 13 mg/dL (ref 6–20)
CO2: 25 mmol/L (ref 22–32)
Calcium: 7.7 mg/dL — ABNORMAL LOW (ref 8.9–10.3)
Chloride: 93 mmol/L — ABNORMAL LOW (ref 98–111)
Creatinine, Ser: 0.65 mg/dL (ref 0.44–1.00)
GFR calc Af Amer: >60 mL/min (ref >60)
GFR calc non Af Amer: >60 mL/min (ref >60)
Glucose, Bld: 258 mg/dL — ABNORMAL HIGH (ref 70–99)
Potassium: 3.4 mmol/L — ABNORMAL LOW (ref 3.5–5.1)
Sodium: 128 mmol/L — ABNORMAL LOW (ref 135–145)

## 2019-12-17 LAB — CBC WITH DIFFERENTIAL/PLATELET
Abs Immature Granulocytes: 0.11 10*3/uL — ABNORMAL HIGH (ref 0.00–0.07)
Basophils Absolute: 0 10*3/uL (ref 0.0–0.1)
Basophils Relative: 0 %
Eosinophils Absolute: 0.1 10*3/uL (ref 0.0–0.5)
Eosinophils Relative: 0 %
HCT: 38.9 % (ref 36.0–46.0)
Hemoglobin: 13.3 g/dL (ref 12.0–15.0)
Immature Granulocytes: 1 %
Lymphocytes Relative: 7 %
Lymphs Abs: 1.2 10*3/uL (ref 0.7–4.0)
MCH: 29.6 pg (ref 26.0–34.0)
MCHC: 34.2 g/dL (ref 30.0–36.0)
MCV: 86.4 fL (ref 80.0–100.0)
Monocytes Absolute: 1.1 10*3/uL — ABNORMAL HIGH (ref 0.1–1.0)
Monocytes Relative: 6 %
Neutro Abs: 16 10*3/uL — ABNORMAL HIGH (ref 1.7–7.7)
Neutrophils Relative %: 86 %
Platelets: 352 10*3/uL (ref 150–400)
RBC: 4.5 MIL/uL (ref 3.87–5.11)
RDW: 13.3 % (ref 11.5–15.5)
WBC: 18.6 10*3/uL — ABNORMAL HIGH (ref 4.0–10.5)
nRBC: 0 % (ref 0.0–0.2)

## 2019-12-17 LAB — GLUCOSE, CAPILLARY
Glucose-Capillary: 161 mg/dL — ABNORMAL HIGH (ref 70–99)
Glucose-Capillary: 218 mg/dL — ABNORMAL HIGH (ref 70–99)
Glucose-Capillary: 242 mg/dL — ABNORMAL HIGH (ref 70–99)
Glucose-Capillary: 258 mg/dL — ABNORMAL HIGH (ref 70–99)
Glucose-Capillary: 262 mg/dL — ABNORMAL HIGH (ref 70–99)
Glucose-Capillary: 284 mg/dL — ABNORMAL HIGH (ref 70–99)
Glucose-Capillary: 73 mg/dL (ref 70–99)

## 2019-12-17 LAB — URINE DRUG SCREEN, QUALITATIVE (ARMC ONLY)
Amphetamines, Ur Screen: POSITIVE — AB
Amphetamines, Ur Screen: POSITIVE — AB
Barbiturates, Ur Screen: NOT DETECTED
Barbiturates, Ur Screen: NOT DETECTED
Benzodiazepine, Ur Scrn: NOT DETECTED
Benzodiazepine, Ur Scrn: NOT DETECTED
Cannabinoid 50 Ng, Ur ~~LOC~~: NOT DETECTED
Cannabinoid 50 Ng, Ur ~~LOC~~: NOT DETECTED
Cocaine Metabolite,Ur ~~LOC~~: NOT DETECTED
Cocaine Metabolite,Ur ~~LOC~~: NOT DETECTED
MDMA (Ecstasy)Ur Screen: NOT DETECTED
MDMA (Ecstasy)Ur Screen: NOT DETECTED
Methadone Scn, Ur: NOT DETECTED
Methadone Scn, Ur: NOT DETECTED
Opiate, Ur Screen: POSITIVE — AB
Opiate, Ur Screen: POSITIVE — AB
Phencyclidine (PCP) Ur S: NOT DETECTED
Phencyclidine (PCP) Ur S: NOT DETECTED
Tricyclic, Ur Screen: POSITIVE — AB
Tricyclic, Ur Screen: POSITIVE — AB

## 2019-12-17 LAB — CBC
HCT: 44.2 % (ref 36.0–46.0)
Hemoglobin: 14.8 g/dL (ref 12.0–15.0)
MCH: 29.7 pg (ref 26.0–34.0)
MCHC: 33.5 g/dL (ref 30.0–36.0)
MCV: 88.8 fL (ref 80.0–100.0)
Platelets: 254 10*3/uL (ref 150–400)
RBC: 4.98 MIL/uL (ref 3.87–5.11)
RDW: 13.3 % (ref 11.5–15.5)
WBC: 13.7 10*3/uL — ABNORMAL HIGH (ref 4.0–10.5)
nRBC: 0 % (ref 0.0–0.2)

## 2019-12-17 LAB — PROCALCITONIN: Procalcitonin: 0.42 ng/mL

## 2019-12-17 LAB — LACTIC ACID, PLASMA
Lactic Acid, Venous: 1.3 mmol/L (ref 0.5–1.9)
Lactic Acid, Venous: 1.6 mmol/L (ref 0.5–1.9)

## 2019-12-17 LAB — BLOOD GAS, ARTERIAL
Acid-Base Excess: 1.3 mmol/L (ref 0.0–2.0)
Bicarbonate: 25 mmol/L (ref 20.0–28.0)
FIO2: 28
O2 Saturation: 98.9 %
Patient temperature: 37
pCO2 arterial: 36 mmHg (ref 32.0–48.0)
pH, Arterial: 7.45 (ref 7.350–7.450)
pO2, Arterial: 123 mmHg — ABNORMAL HIGH (ref 83.0–108.0)

## 2019-12-17 LAB — HIV ANTIBODY (ROUTINE TESTING W REFLEX): HIV Screen 4th Generation wRfx: NONREACTIVE

## 2019-12-17 LAB — TROPONIN I (HIGH SENSITIVITY): Troponin I (High Sensitivity): 12 ng/L (ref <18)

## 2019-12-17 LAB — HEPARIN LEVEL (UNFRACTIONATED)
Heparin Unfractionated: <0.1 IU/mL — ABNORMAL LOW (ref 0.30–0.70)
Heparin Unfractionated: <0.1 IU/mL — ABNORMAL LOW (ref 0.30–0.70)
Heparin Unfractionated: 0.17 IU/mL — ABNORMAL LOW (ref 0.30–0.70)

## 2019-12-17 LAB — AMMONIA: Ammonia: 34 umol/L (ref 9–35)

## 2019-12-17 MED ORDER — NALOXONE HCL 0.4 MG/ML IJ SOLN
0.4000 mg | INTRAMUSCULAR | Status: DC | PRN
  Administered 2019-12-17: 0.4 mg via INTRAVENOUS
  Filled 2019-12-17: qty 1

## 2019-12-17 MED ORDER — CHLORHEXIDINE GLUCONATE CLOTH 2 % EX PADS
6.0000 | MEDICATED_PAD | Freq: Every day | CUTANEOUS | Status: DC
  Administered 2019-12-17 – 2019-12-21 (×5): 6 via TOPICAL

## 2019-12-17 MED ORDER — HEPARIN BOLUS VIA INFUSION
2000.0000 [IU] | Freq: Once | INTRAVENOUS | Status: AC
  Administered 2019-12-17: 2000 [IU] via INTRAVENOUS
  Filled 2019-12-17: qty 2000

## 2019-12-17 MED ORDER — INSULIN GLARGINE 100 UNIT/ML ~~LOC~~ SOLN
50.0000 [IU] | Freq: Every day | SUBCUTANEOUS | Status: DC
  Administered 2019-12-17 – 2019-12-20 (×4): 50 [IU] via SUBCUTANEOUS
  Filled 2019-12-17 (×5): qty 0.5

## 2019-12-17 MED ORDER — NOREPINEPHRINE 4 MG/250ML-% IV SOLN
2.0000 ug/min | INTRAVENOUS | Status: DC
  Administered 2019-12-17: 4 ug/min via INTRAVENOUS
  Filled 2019-12-17: qty 250

## 2019-12-17 MED ORDER — SODIUM CHLORIDE 0.9 % IV BOLUS
500.0000 mL | Freq: Once | INTRAVENOUS | Status: AC
  Administered 2019-12-17: 500 mL via INTRAVENOUS

## 2019-12-17 MED ORDER — SODIUM CHLORIDE 0.9 % IV SOLN: 250.0000 mL | INTRAVENOUS | Status: DC

## 2019-12-17 NOTE — Progress Notes (Signed)
ANTICOAGULATION CONSULT NOTE  Pharmacy Consult for heparin Indication: pulmonary embolus  Allergies  Allergen Reactions  . Ibuprofen Anaphylaxis   Patient Measurements: Height: 5\' 1"  (154.9 cm) Weight: 63.2 kg (139 lb 4.8 oz) IBW/kg (Calculated) : 47.8 Heparin Dosing Weight: 64 kg  Vital Signs: Temp: 98.4 F (36.9 C) (05/01 0739) Temp Source: Oral (05/01 0739) BP: 104/69 (05/01 0739) Pulse Rate: 90 (05/01 0739)  Labs: Recent Labs    12/16/19 1217 12/16/19 1405 12/16/19 1527 12/16/19 2213 12/17/19 0433 12/17/19 0653  HGB 15.3*  --   --   --  14.8  --   HCT 44.2  --   --   --  44.2  --   PLT 339  --   --   --  254  --   APTT  --   --  36  --   --   --   LABPROT  --   --  15.2  --   --   --   INR  --   --  1.3*  --   --   --   HEPARINUNFRC  --   --   --  0.48  --  <0.10*  CREATININE 0.66  --   --  0.73  --  0.65  CKTOTAL  --   --   --  116  --   --   TROPONINIHS 19* 19*  --   --   --   --     Estimated Creatinine Clearance: 86.1 mL/min (by C-G formula based on SCr of 0.65 mg/dL).   Medical History: Past Medical History:  Diagnosis Date  . CHF (congestive heart failure) (HCC)   . Chronic back pain   . Diabetes mellitus without complication (HCC)   . Migraines   . No pertinent past medical history   . Pregnancy induced hypertension     Assessment: 34 year old female presented with chest pain. Imaging with small subsegmental level pulmonary emboli in the posterior and lateral segments of the right lower lobe. Patient does not appear to be on any anticoagulation PTA. Pharmacy consulted to start heparin drip.  0430 2213 HL 0.48, therapeutic x 1 0501 0653 HL <0.10 @ 1050 units/hr  Confirmed no interruptions with nursing  Goal of Therapy:  Heparin level 0.3-0.7 units/ml Monitor platelets by anticoagulation protocol: Yes   Plan:  Subtherapeutic - Will rebolus 2000 units and increase heparin drip to 1250 units/hr and recheck HL in 6 hours to check  level.  CBC daily while on heparin drip.   2214, PharmD, BCPS Clinical Pharmacist 12/17/2019 8:15 AM

## 2019-12-17 NOTE — Progress Notes (Signed)
[place] for heparin Indication: pulmonary embolus  Allergies  Allergen Reactions  . Ibuprofen Anaphylaxis   Patient Measurements: Height: 5\' 1"  (154.9 cm) Weight: 63.2 kg (139 lb 4.8 oz) IBW/kg (Calculated) : 47.8 Heparin Dosing Weight: 64 kg  Vital Signs: Temp: 97.9 F (36.6 C) (05/01 1922) Temp Source: Oral (05/01 1922) BP: 75/58 (05/01 1936) Pulse Rate: 83 (05/01 1936)  Labs: Recent Labs    12/16/19 1217 12/16/19 1217 12/16/19 1405 12/16/19 1527 12/16/19 2213 12/16/19 2213 12/17/19 0433 12/17/19 0653 12/17/19 1436 12/17/19 2125  HGB 15.3*   < >  --   --   --   --  14.8  --   --  13.3  HCT 44.2  --   --   --   --   --  44.2  --   --  38.9  PLT 339  --   --   --   --   --  254  --   --  352  APTT  --   --   --  36  --   --   --   --   --   --   LABPROT  --   --   --  15.2  --   --   --   --   --   --   INR  --   --   --  1.3*  --   --   --   --   --   --   HEPARINUNFRC  --   --   --   --  0.48   < >  --  <0.10* <0.10* 0.17*  CREATININE 0.66   < >  --   --  0.73  --   --  0.65  --  0.58  CKTOTAL  --   --   --   --  116  --   --   --   --   --   TROPONINIHS 19*  --  19*  --   --   --   --   --   --  12   < > = values in this interval not displayed.    Estimated Creatinine Clearance: 86.1 mL/min (by C-G formula based on SCr of 0.58 mg/dL).   Medical History: Past Medical History:  Diagnosis Date  . CHF (congestive heart failure) ([place])   . Chronic back pain   . Diabetes mellitus without complication ([place])   . Migraines   . No pertinent past medical history   . Pregnancy induced hypertension     Assessment: 34 year old female presented with chest pain. Imaging with small subsegmental level pulmonary emboli in the posterior and lateral segments of the right lower lobe. Patient does not appear to be on any anticoagulation PTA. Pharmacy consulted to start heparin drip.  0430 2213 HL 0.48, therapeutic x 1 0501 0653 HL  <0.10 @ 1050 units/hr 05/01 @ 1527 HL <0.10. Subtherapeutic. Per floor nurse, no heparin interruptions. However, the nurse does suspect that the line was occluded as there was an alert for on the device. If line is occluded, it is unclear the true heparin level.  5/1 2125 HL 0.17, SUBtherapeutic.  Goal of Therapy:  Heparin level 0.3-0.7 units/ml Monitor platelets by anticoagulation protocol: Yes   Plan:  Heparin Subtherapeutic.  Will rebolus with 2000 units and increase Heparin drip to 1500 units/hr, will recheck HL in ~6 hours  CBC daily while on heparin drip.   Wayland Denis, PharmD Clinical Pharmacist 12/17/2019 10:15 PM

## 2019-12-17 NOTE — Progress Notes (Signed)
Upon entering room for shift report, nurse tech was at bedside attempting to get VS; notified by tech that BP was low, in the 70's. Patient noted to be very somnolent, responsive only to sternal rub. Pt also noted to have inadequate respirations, periods of apnea, although O2 sat in 90's. O2 placed on pt at 2 liters per minute. Rapid response called. CBG noted to be 200. NS bolus started at 999 per hour for 500 cc. NS with KCL DC'd. Heparin continued at previously. ABG performed by RT. Patient transferred to ICU; reported off to receiving RN.

## 2019-12-17 NOTE — Progress Notes (Signed)
ANTICOAGULATION CONSULT NOTE  Pharmacy Consult for heparin Indication: pulmonary embolus  Allergies  Allergen Reactions  . Ibuprofen Anaphylaxis   Patient Measurements: Height: 5\' 1"  (154.9 cm) Weight: 73.5 kg (162 lb) IBW/kg (Calculated) : 47.8 Heparin Dosing Weight: 64 kg  Vital Signs: Temp: 99.6 F (37.6 C) (04/30 1936) Temp Source: Oral (04/30 1936) BP: 139/101 (04/30 1936) Pulse Rate: 90 (04/30 2322)  Labs: Recent Labs    12/16/19 1217 12/16/19 1405 12/16/19 1527 12/16/19 2213  HGB 15.3*  --   --   --   HCT 44.2  --   --   --   PLT 339  --   --   --   APTT  --   --  36  --   LABPROT  --   --  15.2  --   INR  --   --  1.3*  --   HEPARINUNFRC  --   --   --  0.48  CREATININE 0.66  --   --  0.73  CKTOTAL  --   --   --  116  TROPONINIHS 19* 19*  --   --     Estimated Creatinine Clearance: 92.6 mL/min (by C-G formula based on SCr of 0.73 mg/dL).   Medical History: Past Medical History:  Diagnosis Date  . CHF (congestive heart failure) (HCC)   . Chronic back pain   . Diabetes mellitus without complication (HCC)   . Migraines   . No pertinent past medical history   . Pregnancy induced hypertension     Assessment: 34 year old female presented with chest pain. Imaging with small subsegmental level pulmonary emboli in the posterior and lateral segments of the right lower lobe. Patient does not appear to be on any anticoagulation PTA. Pharmacy consulted to start heparin drip.  Goal of Therapy:  Heparin level 0.3-0.7 units/ml Monitor platelets by anticoagulation protocol: Yes   Plan:  Heparin 4000 unit bolus followed by heparin drip at 1050 units/hr. HL at 2200. CBC daily while on heparin drip.  0430 2213 HL 0.48, therapeutic x 1.  Will continue heparin drip at current rate and recheck HL in 6 hours to confirm.  34, PharmD 12/17/2019,12:22 AM

## 2019-12-17 NOTE — Consult Note (Addendum)
Name: Cheryl Mercer MRN: [id number] DOB: [birth year]    ADMISSION DATE:  12/16/2019 CONSULTATION DATE:  12/17/2019  REFERRING MD :  Rufina Falco, NP  CHIEF COMPLAINT:  Altered mental status & Hypotension  BRIEF PATIENT DESCRIPTION:  34 y.o. Female admitted 12/16/19 to Telemetry unit for small Subsegmental PE in the posterior & lateral segments of the RLL with likely pulmonary infarct vs. 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  Urine drug screen was positive for amphetamines, opiates, tricyclics.  On 12/17/19 she is somnolent, hypotensive, with periods of apnea requiring transfer to ICU.  Concern for drug intoxication as an unknown pill fragment found in her bed.  SIGNIFICANT EVENTS  4/30: Admission to Telemetry 5/1: Transfer to ICU; PCCM consulted  STUDIES:  4/30: CXR>>No acute abnormalities. 4/30: CTA Chest>> 1. Small subsegmental level pulmonary emboli in the posterior and lateral segments of the right lower lobe. No more central pulmonary emboli evident on either side. 2. Significantly less infiltrate compared to prior CT. There is relatively mild airspace opacity in each lower lobe, probably due to residua from atypical organism pneumonia. Somewhat wedge-shaped area of increased opacity noted abutting the pleura in the superior segment right lower lobe. Suspect pneumonia in this area. Infarct could present in this manner, although there is currently no pulmonary embolus supplying this area. Potential infarct from residual pulmonary embolus which has recanalized is conceivable. 3. Prominence of the main pulmonary outflow tract, a finding indicative of a degree of pulmonary arterial hypertension. 4.  No evident adenopathy. 5.  Small hiatal hernia. 5/1: 2D Echocardiogram>>  CULTURES: SARS-CoV-2 PCR 4/30>> negative Influenza PCR 4/30>> negative Urine 4/30>> E. Coli (sensitivity still pending) Blood culture  4/30>> Strep pneumo urinary antigen 5/1>> Legionella urinary antigen 5/1>>  ANTIBIOTICS: Azithromycin 4/30>> Ceftriaxone 4/30>>  HISTORY OF PRESENT ILLNESS:   Cheryl Mercer is a 34 year old female with a past medical history significant for recent COVID-19 infection in Jan. 7915, combined Systolic & Diastolic CHF(EF 20 to 05% in March 2021), diabetes mellitus, migraines, chronic back pain who presented to [facility] ED on 12/16/2019 with complaints of abrupt onset of right-sided chest pain.  Her chest pain is pleuritic in nature as she reported the pain is worse with inspiration (and to some degree with expiration).  She denied cough, sputum production, nausea, vomiting, abdominal pain, or sick contacts.  Upon presentation she was found to be febrile with temperature 100.3.  Of note she was recently hospitalized and discharged from [facility] on 11/10/2019 for acute on chronic HFrEF, and she failed to follow-up with her heart failure outpatient appointment on 11/14/2019.  Initial work-up in the ED revealed sodium 129, potassium 3.2, glucose 254, WBC 13.7.  Urinalysis is concerning for urinary tract infection (urine culture with E. Coli).  Chest x-ray was negative for any acute abnormality.  CTA chest obtained which revealed small segmental PE in the posterior and lateral segments of the right lower lobe with a wedge-shaped opacity in the right lower lobe concerning for atypical pneumonia versus pulmonary infarct.  Her SARS-CoV-2 PCR and influenza PCR are both negative.  Urine drug screen is positive for amphetamines, opiates, and tricyclics.   She was admitted to the telemetry unit by the hospitalist for further work-up and treatment of acute hypoxic respiratory failure secondary to pulmonary embolism with questionable pulmonary infarct versus questionable atypical pneumonia, UTI, and acute on chronic combined systolic and diastolic CHF.  On 12/17/2019 she was found to be somnolent, hypotensive with  systolic  blood pressure in the 70s, and with periods of apnea.  Nursing noted that they had found an unknown pill fragment in the bed with the patient.  She was placed on supplemental O2,  given a 500 cc fluid bolus, and transferred to ICU.  ABG is normal, and EKG is reassuring (no right axis deviation, right atrial enlargement, or right ventricular hypertrophy, no ST segment changes).  Chest X-ray with cardiomegaly and mild bibasilar atelectasis. She was placed on low-dose Levophed infusion for hypotension, and given 0.4 mg IV Narcan with noted improvement in mental status and respiratory status.  CBC, CMP, ammonia, procalcitonin, lactic acid, troponin, and repeat urine drug screen are all currently pending.    PCCM is consulted for further management of hypotension and acute metabolic encephalopathy in the setting of suspected drug intoxication.  PAST MEDICAL HISTORY :   has a past medical history of CHF (congestive heart failure) ([place]), Chronic back pain, Diabetes mellitus without complication ([place]), Migraines, No pertinent past medical history, and Pregnancy induced hypertension.  has a past surgical history that includes Cervical biopsy w/ loop electrode excision (2009); Laparoscopic tubal sterilization with Falope rings. (2009); Tubal ligation; and RIGHT/LEFT HEART CATH AND CORONARY ANGIOGRAPHY (N/A, 03/25/2019). Prior to Admission medications   Medication Sig Start Date End Date Taking? Authorizing Provider  atorvastatin (LIPITOR) 40 MG tablet Take 1 tablet (40 mg total) by mouth daily at 6 PM. 09/08/19  Yes Max Sane, MD  blood glucose meter kit and supplies Dispense based on patient and insurance preference. Check blood sugar 4 times daily, three times before meals and once before bedtime. (FOR ICD-10 E10.9, E11.9). 09/08/19  Yes Max Sane, MD  carvedilol (COREG) 6.25 MG tablet Take 1 tablet (6.25 mg total) by mouth 2 (two) times daily with a meal. 09/08/19  Yes Max Sane, MD  cyclobenzaprine (FLEXERIL)  10 MG tablet Take 10 mg by mouth 3 (three) times daily as needed for muscle spasms. 04/07/19  Yes [provider]  digoxin (LANOXIN) 0.125 MG tablet Take 1 tablet (0.125 mg total) by mouth daily. 09/08/19  Yes Max Sane, MD  empagliflozin (JARDIANCE) 10 MG TABS tablet Take 10 mg by mouth daily. 09/08/19  Yes Max Sane, MD  furosemide (LASIX) 40 MG tablet Take 1 tablet (40 mg total) by mouth daily as needed (for 3 pound weight gain). 11/10/19 11/09/20 Yes Wyvonnia Dusky, MD  gabapentin (NEURONTIN) 300 MG capsule Take 1 capsule (300 mg total) by mouth 3 (three) times daily. 09/08/19  Yes Max Sane, MD  insulin aspart (NOVOLOG FLEXPEN) 100 UNIT/ML FlexPen Inject 10 Units into the skin 3 (three) times daily with meals. 09/08/19  Yes Max Sane, MD  Insulin Glargine (LANTUS) 100 UNIT/ML Solostar Pen Inject 40 Units into the skin daily. 09/08/19  Yes Max Sane, MD  metFORMIN (GLUCOPHAGE) 500 MG tablet Take 1 tablet (500 mg total) by mouth 2 (two) times daily with a meal. 09/08/19  Yes Max Sane, MD  PARoxetine (PAXIL) 10 MG tablet Take 1 tablet (10 mg total) by mouth daily. 09/08/19  Yes Max Sane, MD  rizatriptan (MAXALT) 10 MG tablet Take 1 tablet (10 mg total) by mouth as needed for migraine. May repeat in 2 hours if needed 09/08/19  Yes Max Sane, MD  sacubitril-valsartan (ENTRESTO) 24-26 MG Take 1 tablet by mouth 2 (two) times daily. 09/08/19  Yes Max Sane, MD  spironolactone (ALDACTONE) 25 MG tablet Take 1 tablet (25 mg total) by mouth daily. 09/08/19  Yes Manuella Ghazi,  Vipul, MD  furosemide (LASIX) 20 MG tablet Take 1 tablet (20 mg total) by mouth daily. 11/10/19 12/10/19  Wyvonnia Dusky, MD   Allergies  Allergen Reactions  . Ibuprofen Anaphylaxis    FAMILY HISTORY:  family history includes Hypertension in her mother. SOCIAL HISTORY:  reports that she has never smoked. She has never used smokeless tobacco. She reports that she does not drink alcohol or use drugs.   COVID-19  DISASTER DECLARATION:  FULL CONTACT PHYSICAL EXAMINATION WAS NOT POSSIBLE DUE TO TREATMENT OF COVID-19 AND  CONSERVATION OF PERSONAL PROTECTIVE EQUIPMENT, LIMITED EXAM FINDINGS INCLUDE-  Patient assessed or the symptoms described in the history of present illness.  In the context of the Global COVID-19 pandemic, which necessitated consideration that the patient might be at risk for infection with the SARS-CoV-2 virus that causes COVID-19, Institutional protocols and algorithms that pertain to the evaluation of patients at risk for COVID-19 are in a state of rapid change based on information released by regulatory bodies including the CDC and federal and state organizations. These policies and algorithms were followed during the patient's care while in hospital.  REVIEW OF SYSTEMS:  Positives in BOLD Constitutional: Negative for fever, chills, weight loss, malaise/fatigue and diaphoresis.  HENT: Negative for hearing loss, ear pain, nosebleeds, congestion, sore throat, neck pain, tinnitus and ear discharge.   Eyes: Negative for blurred vision, double vision, photophobia, pain, discharge and redness.  Respiratory: Negative for cough, hemoptysis, sputum production, shortness of breath, wheezing and stridor.   Cardiovascular: Negative for + pleuritic chest pain, palpitations, orthopnea, claudication, leg swelling and PND. Gastrointestinal: Negative for heartburn, nausea, vomiting, abdominal pain, diarrhea, constipation, blood in stool and melena.  Genitourinary: Negative for dysuria, urgency, frequency, hematuria and flank pain.  Musculoskeletal: Negative for myalgias, back pain, joint pain and falls.  Skin: Negative for itching and rash.  Neurological: Negative for dizziness, tingling, tremors, sensory change, speech change, focal weakness, seizures, loss of consciousness, weakness and headaches.  Endo/Heme/Allergies: Negative for environmental allergies and polydipsia. Does not bruise/bleed  easily.  SUBJECTIVE:  Pt reports pleuritic chest pain with deep inspiration Denies shortness of breath, cough, fever, chills, abdominal pain, nausea, vomiting On 2L nasal cannula w/ O2 saturations 99%  VITAL SIGNS: Temp:  [97.9 F (36.6 C)-98.9 F (37.2 C)] 97.9 F (36.6 C) (05/01 1922) Pulse Rate:  [83-90] 83 (05/01 1936) Resp:  [12-20] 12 (05/01 1930) BP: (74-104)/(47-69) 75/58 (05/01 1936) SpO2:  [95 %-100 %] 99 % (05/01 1922) Weight:  [63.2 kg] 63.2 kg (05/01 0527)  PHYSICAL EXAMINATION: General: Acutely ill-appearing female, laying in bed, somnolent, on nasal cannula, currently maintaining her airway, no acute distress Neuro: Somnolent, arouses to voice, follows commands, no focal deficits, speech clear, pupils PERRLA, 3 mm brisk bilaterally HEENT: Atraumatic, normocephalic, neck supple, no JVD Cardiovascular: Regular rate and rhythm, S1-S2, no murmurs, rubs, gallops, 2+ pulses Lungs: Clear to auscultation bilaterally, even, nonlabored, normal effort, currently maintaining airway Abdomen: Soft, nontender, nondistended, no guarding or rebound tenderness, bowel sounds positive x4 Musculoskeletal: Normal bulk and tone, no deformities, no edema Skin: Warm and dry.  No obvious rashes, lesions, ulcerations  Recent Labs  Lab 12/16/19 1217 12/16/19 1217 12/16/19 1925 12/16/19 2213 12/17/19 0653  NA 127*  --   --  129* 128*  K 3.6  --   --  3.2* 3.4*  CL 91*  --   --  92* 93*  CO2 24  --   --  24 25  BUN <5*  --   --  7 13  CREATININE 0.66  --   --  0.73 0.65  GLUCOSE 490*   < > 651* 254* 258*   < > = values in this interval not displayed.   Recent Labs  Lab 12/16/19 1217 12/17/19 0433  HGB 15.3* 14.8  HCT 44.2 44.2  WBC 12.7* 13.7*  PLT 339 254   DG Chest 2 View  Result Date: 12/16/2019 CLINICAL DATA:  Chest pain, shortness of breath, RIGHT-side rib and chest pain with inspiration, accidentally kicked in RIGHT side while sleeping, history diabetes mellitus,  hypertension EXAM: CHEST - 2 VIEW COMPARISON:  11/07/2019 FINDINGS: Enlargement of cardiac silhouette. Mediastinal contours and pulmonary vascularity normal. Lungs clear. No pulmonary infiltrate, pleural effusion or pneumothorax. Osseous structures unremarkable. IMPRESSION: No acute abnormalities. Electronically Signed   By: Lavonia Dana M.D.   On: 12/16/2019 12:54   CT Angio Chest PE W and/or Wo Contrast  Result Date: 12/16/2019 CLINICAL DATA:  Shortness of breath EXAM: CT ANGIOGRAPHY CHEST WITH CONTRAST TECHNIQUE: Multidetector CT imaging of the chest was performed using the standard protocol during bolus administration of intravenous contrast. Multiplanar CT image reconstructions and MIPs were obtained to evaluate the vascular anatomy. CONTRAST:  38m OMNIPAQUE IOHEXOL 350 MG/ML SOLN COMPARISON:  Chest radiograph December 16, 2019 and CT angiogram chest September 06, 2019 FINDINGS: Cardiovascular: There are small peripheral subsegmental pulmonary emboli in the lateral and posterior segments of the right lower lobe. No more central pulmonary emboli evident. No evident right heart strain. No thoracic aortic aneurysm or dissection. Visualized great vessels appear unremarkable. Note that the right innominate and left common carotid arteries arise as a common trunk, an anatomic variant. The main pulmonary outflow tract measures 3.2 cm in diameter, prominent. Mediastinum/Nodes: Thyroid appears unremarkable. No evident thoracic adenopathy. There is a small hiatal hernia. Lungs/Pleura: There is mild patchy airspace opacity in each lower lobe. A somewhat wedge-shaped area of airspace opacity is noted in the posterior aspect of the superior segment of the right lower lobe. Lungs elsewhere are clear. No pleural effusions. Upper Abdomen: Visualized upper abdominal structures appear unremarkable. Musculoskeletal: No blastic or lytic bone lesions. No evident chest wall lesions. Review of the MIP images confirms the above  findings. IMPRESSION: 1. Small subsegmental level pulmonary emboli in the posterior and lateral segments of the right lower lobe. No more central pulmonary emboli evident on either side. 2. Significantly less infiltrate compared to prior CT. There is relatively mild airspace opacity in each lower lobe, probably due to residua from atypical organism pneumonia. Somewhat wedge-shaped area of increased opacity noted abutting the pleura in the superior segment right lower lobe. Suspect pneumonia in this area. Infarct could present in this manner, although there is currently no pulmonary embolus supplying this area. Potential infarct from residual pulmonary embolus which has recanalized is conceivable. 3. Prominence of the main pulmonary outflow tract, a finding indicative of a degree of pulmonary arterial hypertension. 4.  No evident adenopathy. 5.  Small hiatal hernia. Critical Value/emergent results were called by telephone at the time of interpretation on 12/16/2019 at 3:04 pm to provider RLavonia Drafts, who verbally acknowledged these results. Electronically Signed   By: WLowella GripIII M.D.   On: 12/16/2019 15:06    ASSESSMENT / PLAN:  A: Acute hypoxic respiratory failure secondary to small subsegmental PE of the posterior and lateral segments of the right lower lobe, ? atypical pneumonia vs. ? pulmonary infarct, acute on chronic combined Systolic & Diastolic CHF, & ? Drug intoxication  P: -Supplemental O2 as needed to maintain O2 sats greater than 92% -Currently maintaining airway, but is high risk for intubation due to intermittent somnolence -Follow intermittent ABG and chest x-ray as needed -Continue heparin drip -Continue azithromycin and Rocephin -IV Lasix as blood pressure and renal function permits  -PRN Narcan  A: -Small subsegmental PE of the posterior and lateral segments of the right lower lobe w/ ? Pulmonary infarct -Hypotension, Suspect Developing septic shock vs. Hypovolemic  shock (due to diuresis) vs. Obstructive shock due to PE (less likely as EKG reassuring) -Acute on chronic combined Systolic & Diastolic CHF (EF 17-71% on 11/07/19) P: -Continuous cardiac monitoring -Maintain MAP greater than 65 -Received 500 cc bolus -Cautious IV fluids given CHF history -Levophed as needed to maintain MAP goal -Trend Lactic Acid ~ 1.3 -Check Procalcitonin ~ 0.42 (previously <0.10) -EKG prior to transfer without RAD, right atrial enlargement or right ventricular hypertrophy  -Check high sensitivity troponin ~ 12 -2D echocardiogram is pending -Cardiology following, appreciate input -Hold IV Lasix and home antihypertensives at this time due to hypotension  A: Acute Metabolic Encephalopathy, ? In setting of drug intoxication P: -Provide supportive care -Urine drug screen on admission positive for amphetamines, opiates, tricyclics -Will Repeat urine drug screen, as patient found with unknown pill in her bed -ABG is normal -Check ammonia ~ 34 -Given 0.4 mg Narcan with good response; PRN Narcan vs Narcan gtt if needed  A: UTI (E. Coli) ? Atypical Pneumonia P: -Monitor fever curve -Trend WBCs and procalcitonin -Follow cultures as above -Continue azithromycin and ceftriaxone for now  A:  Hyponatremia, likely hypotonic hypervolemic in setting of CHF P: -Monitor I&O's / urinary output -Follow BMP -Ensure adequate renal perfusion -Avoid nephrotoxic agents as able -Replace electrolytes as indicated  A: Diabetes mellitus II P: -CBGs -Sliding scale insulin and Lantus (holding home Metformin due to lactic acidosis) -Follow ICU hypo/hyperglycemia protocol         Best practices: Disposition: ICU Goals of care: Full code VTE prophylaxis/anticoagulation: Heparin drip Consults: Hospitalist (primary service), Cardiology, PCCM Updates: Updated patient at bedside 12/17/2019  Darel Hong, [facility] Pager:  [phone]  12/17/2019, 8:03 PM

## 2019-12-17 NOTE — Progress Notes (Signed)
Progress Note  Patient Name: Cheryl Mercer Date of Encounter: 12/17/2019  Primary Cardiologist: Bensimhon  Subjective   She continues to note right-sided pleuritic chest pain.  Dyspnea is improving.  Remains on heparin drip for PE.  Inpatient Medications    Scheduled Meds: . atorvastatin  40 mg Oral q1800  . carvedilol  6.25 mg Oral BID WC  . digoxin  0.125 mg Oral Daily  . furosemide  40 mg Intravenous Q12H  . gabapentin  300 mg Oral TID  . insulin aspart  0-15 Units Subcutaneous TID WC  . insulin aspart  0-5 Units Subcutaneous QHS  . insulin aspart  10 Units Subcutaneous TID WC  . insulin glargine  40 Units Subcutaneous Daily  .  morphine injection  4 mg Intravenous Once  . PARoxetine  10 mg Oral Daily  . sacubitril-valsartan  1 tablet Oral BID  . sodium chloride flush  3 mL Intravenous Q12H  . spironolactone  25 mg Oral Daily   Continuous Infusions: . sodium chloride 10 mL/hr at 12/16/19 1534  . sodium chloride    . 0.9 % NaCl with KCl 20 mEq / L 75 mL/hr at 12/16/19 2302  . azithromycin    . cefTRIAXone (ROCEPHIN)  IV    . heparin 1,050 Units/hr (12/16/19 1543)   PRN Meds: sodium chloride, acetaminophen, cyclobenzaprine, ondansetron (ZOFRAN) IV, oxyCODONE, sodium chloride flush   Vital Signs    Vitals:   12/16/19 1936 12/16/19 2322 12/17/19 0527 12/17/19 0739  BP: (!) 139/101  101/65 104/69  Pulse: (!) 107 90 87 90  Resp: 20  20 17   Temp: 99.6 F (37.6 C)  98.4 F (36.9 C) 98.4 F (36.9 C)  TempSrc: Oral  Oral Oral  SpO2: 96%  97% 100%  Weight:   63.2 kg   Height:        Intake/Output Summary (Last 24 hours) at 12/17/2019 0743 Last data filed at 12/17/2019 0446 Gross per 24 hour  Intake --  Output 500 ml  Net -500 ml   Filed Weights   12/16/19 1213 12/17/19 0527  Weight: 73.5 kg 63.2 kg    Telemetry    SR- Personally Reviewed  ECG    No new tracings - Personally Reviewed  Physical Exam   GEN: No acute distress.   Neck: No  JVD. Cardiac: RRR, no murmurs, rubs, or gallops.  Respiratory: Clear to auscultation bilaterally.  GI: Soft, nontender, non-distended.   MS: No edema; No deformity. Neuro:  Alert and oriented x 3; Nonfocal.  Psych: Normal affect.  Labs    Chemistry Recent Labs  Lab 12/16/19 1217 12/16/19 1217 12/16/19 1925 12/16/19 2213 12/17/19 0653  NA 127*  --   --  129* 128*  K 3.6  --   --  3.2* 3.4*  CL 91*  --   --  92* 93*  CO2 24  --   --  24 25  GLUCOSE 490*   < > 651* 254* 258*  BUN <5*  --   --  7 13  CREATININE 0.66  --   --  0.73 0.65  CALCIUM 8.6*  --   --  8.1* 7.7*  GFRNONAA >60  --   --  >60 >60  GFRAA >60  --   --  >60 >60  ANIONGAP 12  --   --  13 10   < > = values in this interval not displayed.     Hematology Recent Labs  Lab 12/16/19  1217 12/17/19 0433  WBC 12.7* 13.7*  RBC 5.14* 4.98  HGB 15.3* 14.8  HCT 44.2 44.2  MCV 86.0 88.8  MCH 29.8 29.7  MCHC 34.6 33.5  RDW 13.2 13.3  PLT 339 254    Cardiac EnzymesNo results for input(s): TROPONINI in the last 168 hours. No results for input(s): TROPIPOC in the last 168 hours.   BNP Recent Labs  Lab 12/16/19 1217  BNP 938.0*     DDimer No results for input(s): DDIMER in the last 168 hours.   Radiology    DG Chest 2 View  Result Date: 12/16/2019 IMPRESSION: No acute abnormalities. Electronically Signed   By: Lavonia Dana M.D.   On: 12/16/2019 12:54   CT Angio Chest PE W and/or Wo Contrast  Result Date: 12/16/2019 IMPRESSION: 1. Small subsegmental level pulmonary emboli in the posterior and lateral segments of the right lower lobe. No more central pulmonary emboli evident on either side. 2. Significantly less infiltrate compared to prior CT. There is relatively mild airspace opacity in each lower lobe, probably due to residua from atypical organism pneumonia. Somewhat wedge-shaped area of increased opacity noted abutting the pleura in the superior segment right lower lobe. Suspect pneumonia in this area.  Infarct could present in this manner, although there is currently no pulmonary embolus supplying this area. Potential infarct from residual pulmonary embolus which has recanalized is conceivable. 3. Prominence of the main pulmonary outflow tract, a finding indicative of a degree of pulmonary arterial hypertension. 4.  No evident adenopathy. 5.  Small hiatal hernia. Critical Value/emergent results were called by telephone at the time of interpretation on 12/16/2019 at 3:04 pm to provider Lavonia Drafts , who verbally acknowledged these results. Electronically Signed   By: Lowella Grip III M.D.   On: 12/16/2019 15:06    Cardiac Studies   2D echo 11/07/2019: 1. Left ventricular ejection fraction, by estimation, is 20 to 25%. The  left ventricle has severely decreased function. The left ventricle  demonstrates global hypokinesis. The left ventricular internal cavity size  was mildly dilated. There is mild left ventricular hypertrophy. Indeterminate diastolic filling due to E-A fusion.  2. Right ventricular systolic function is moderately reduced. The right  ventricular size is normal. There is mildly elevated pulmonary artery  systolic pressure. The estimated right ventricular systolic pressure is  62.7 mmHg.  3. Left atrial size was mildly dilated.  4. The mitral valve is normal in structure. Mild mitral valve  regurgitation. No evidence of mitral stenosis.  5. The aortic valve is tricuspid. Aortic valve regurgitation is not  visualized. No aortic stenosis is present.  6. The inferior vena cava is normal in size with <50% respiratory  variability, suggesting right atrial pressure of 8 mmHg.   Patient Profile     34 y.o. female with history of HFrEF secondary to NICM, poorly controlled IDDM, recent COVID-19 PNA in 08/2019, HTN, migraines and chronic back painwho was admitted with PE and we are seeing today for the evaluation of CHF and right sided chest pain.  Assessment & Plan    1.  PE/right-sided pleuritic chest pain: -Possibly in the setting of her recent COVID-19 infection -Remains on heparin gtt at this time -Transition to [place] prior to discharge  -Management per IM  2. HFrEF secondary to NICM: -She appears euvolemic and well compensated -Would be cautious not to over diurese -She is on optimal GDMT, including Coreg, digoxin, Entresto, spironolactone, and Lasix without room for escalation at this time -Recent  echo as above -Continue to follow up with the advanced heart failure team  3. HTN: -Blood pressure is well controlled -Continue medications as above  4. Hypokalemia/hypomagnesemia: -Replete potassium and magnesium to goal of 4.0 and 2.0 respectively  -Status post IV magnesium and currently receiving IV potassium  5. Poorly controlled DM: -Blood glucose of 651 upon admission -Per IM  6. Elevated HS-TN: -Minimal bump in the setting of acute PE -Not consistent with ACS -No plans for inpatient ischemic evaluation   7. Sinus tachycardia: -Improved -In the setting of her PE, with management per IM   For questions or updates, please contact [facility] Please consult [email or web address] for contact info under Cardiology/STEMI.    Signed, Eula Listen, PA-C [facility] Pager: [phone] 12/17/2019, 7:43 AM

## 2019-12-17 NOTE — Progress Notes (Addendum)
ANTICOAGULATION CONSULT NOTE  Pharmacy Consult for heparin Indication: pulmonary embolus  Allergies  Allergen Reactions  . Ibuprofen Anaphylaxis   Patient Measurements: Height: 5\' 1"  (154.9 cm) Weight: 63.2 kg (139 lb 4.8 oz) IBW/kg (Calculated) : 47.8 Heparin Dosing Weight: 64 kg  Vital Signs: Temp: 98.9 F (37.2 C) (05/01 1148) Temp Source: Oral (05/01 1148) BP: 93/61 (05/01 1148) Pulse Rate: 85 (05/01 1148)  Labs: Recent Labs    12/16/19 1217 12/16/19 1405 12/16/19 1527 12/16/19 2213 12/17/19 0433 12/17/19 0653 12/17/19 1436  HGB 15.3*  --   --   --  14.8  --   --   HCT 44.2  --   --   --  44.2  --   --   PLT 339  --   --   --  254  --   --   APTT  --   --  36  --   --   --   --   LABPROT  --   --  15.2  --   --   --   --   INR  --   --  1.3*  --   --   --   --   HEPARINUNFRC  --   --   --  0.48  --  <0.10* <0.10*  CREATININE 0.66  --   --  0.73  --  0.65  --   CKTOTAL  --   --   --  116  --   --   --   TROPONINIHS 19* 19*  --   --   --   --   --     Estimated Creatinine Clearance: 86.1 mL/min (by C-G formula based on SCr of 0.65 mg/dL).   Medical History: Past Medical History:  Diagnosis Date  . CHF (congestive heart failure) (HCC)   . Chronic back pain   . Diabetes mellitus without complication (HCC)   . Migraines   . No pertinent past medical history   . Pregnancy induced hypertension     Assessment: 34 year old female presented with chest pain. Imaging with small subsegmental level pulmonary emboli in the posterior and lateral segments of the right lower lobe. Patient does not appear to be on any anticoagulation PTA. Pharmacy consulted to start heparin drip.  0430 2213 HL 0.48, therapeutic x 1 0501 0653 HL <0.10 @ 1050 units/hr 05/01 @ 1527 HL <0.10. Subtherapeutic. Per floor nurse, no heparin interruptions. However, the nurse does suspect that the line was occluded as there was an alert for on the device. If line is occluded, it is unclear the  true heparin level.   Goal of Therapy:  Heparin level 0.3-0.7 units/ml Monitor platelets by anticoagulation protocol: Yes   Plan:  Subtherapeutic with suspected line occulsion - Will continue current heparin drip to 1250 units/hr, to avoid possible supratherapeutic level with increasing rate, and recheck HL in ~6 hours to check level.  CBC daily while on heparin drip.   07/01, PharmD Clinical Pharmacist 12/17/2019 3:47 PM
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
## 2019-12-18 DIAGNOSIS — I2699 Other pulmonary embolism without acute cor pulmonale: Secondary | ICD-10-CM | POA: Diagnosis not present

## 2019-12-18 DIAGNOSIS — J189 Pneumonia, unspecified organism: Secondary | ICD-10-CM

## 2019-12-18 DIAGNOSIS — R0681 Apnea, not elsewhere classified: Secondary | ICD-10-CM

## 2019-12-18 DIAGNOSIS — I5043 Acute on chronic combined systolic (congestive) and diastolic (congestive) heart failure: Secondary | ICD-10-CM | POA: Diagnosis not present

## 2019-12-18 DIAGNOSIS — I10 Essential (primary) hypertension: Secondary | ICD-10-CM | POA: Diagnosis not present

## 2019-12-18 LAB — BASIC METABOLIC PANEL
Anion gap: 7 (ref 5–15)
BUN: 18 mg/dL (ref 6–20)
CO2: 25 mmol/L (ref 22–32)
Calcium: 7.6 mg/dL — ABNORMAL LOW (ref 8.9–10.3)
Chloride: 99 mmol/L (ref 98–111)
Creatinine, Ser: 0.56 mg/dL (ref 0.44–1.00)
GFR calc Af Amer: >60 mL/min (ref >60)
GFR calc non Af Amer: >60 mL/min (ref >60)
Glucose, Bld: 227 mg/dL — ABNORMAL HIGH (ref 70–99)
Potassium: 3.3 mmol/L — ABNORMAL LOW (ref 3.5–5.1)
Sodium: 131 mmol/L — ABNORMAL LOW (ref 135–145)

## 2019-12-18 LAB — CBC
HCT: 39.6 % (ref 36.0–46.0)
Hemoglobin: 13.8 g/dL (ref 12.0–15.0)
MCH: 29.7 pg (ref 26.0–34.0)
MCHC: 34.8 g/dL (ref 30.0–36.0)
MCV: 85.2 fL (ref 80.0–100.0)
Platelets: 324 10*3/uL (ref 150–400)
RBC: 4.65 MIL/uL (ref 3.87–5.11)
RDW: 13.4 % (ref 11.5–15.5)
WBC: 16.1 10*3/uL — ABNORMAL HIGH (ref 4.0–10.5)
nRBC: 0 % (ref 0.0–0.2)

## 2019-12-18 LAB — URINE CULTURE: Culture: >=100000 — AB

## 2019-12-18 LAB — GLUCOSE, CAPILLARY
Glucose-Capillary: 196 mg/dL — ABNORMAL HIGH (ref 70–99)
Glucose-Capillary: 146 mg/dL — ABNORMAL HIGH (ref 70–99)
Glucose-Capillary: 97 mg/dL (ref 70–99)
Glucose-Capillary: 144 mg/dL — ABNORMAL HIGH (ref 70–99)
Glucose-Capillary: 126 mg/dL — ABNORMAL HIGH (ref 70–99)
Glucose-Capillary: 136 mg/dL — ABNORMAL HIGH (ref 70–99)

## 2019-12-18 LAB — STREP PNEUMONIAE URINARY ANTIGEN: Strep Pneumo Urinary Antigen: NEGATIVE

## 2019-12-18 LAB — HEPARIN LEVEL (UNFRACTIONATED)
Heparin Unfractionated: 0.67 IU/mL (ref 0.30–0.70)
Heparin Unfractionated: 0.72 IU/mL — ABNORMAL HIGH (ref 0.30–0.70)

## 2019-12-18 LAB — TROPONIN I (HIGH SENSITIVITY): Troponin I (High Sensitivity): 12 ng/L (ref <18)

## 2019-12-18 LAB — BRAIN NATRIURETIC PEPTIDE: B Natriuretic Peptide: 195 pg/mL — ABNORMAL HIGH (ref 0.0–100.0)

## 2019-12-18 LAB — PROCALCITONIN: Procalcitonin: 0.39 ng/mL

## 2019-12-18 MED ORDER — APIXABAN 5 MG PO TABS: 5.0000 mg | ORAL_TABLET | Freq: Two times a day (BID) | ORAL | Status: DC

## 2019-12-18 MED ORDER — APIXABAN 5 MG PO TABS
10.0000 mg | ORAL_TABLET | Freq: Two times a day (BID) | ORAL | Status: DC
  Administered 2019-12-18 – 2019-12-21 (×7): 10 mg via ORAL
  Filled 2019-12-18 (×7): qty 2
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
## 2019-12-18 NOTE — Progress Notes (Signed)
ANTICOAGULATION CONSULT NOTE  Pharmacy Consult for heparin Indication: pulmonary embolus  Allergies  Allergen Reactions  . Ibuprofen Anaphylaxis   Patient Measurements: Height: 5\' 1"  (154.9 cm) Weight: 64.7 kg (142 lb 10.2 oz) IBW/kg (Calculated) : 47.8 Heparin Dosing Weight: 64 kg  Vital Signs: Temp: 97.9 F (36.6 C) (05/01 1922) Temp Source: Oral (05/01 1922) BP: 77/54 (05/01 2000) Pulse Rate: 84 (05/01 2000)  Labs: Recent Labs    12/16/19 1217 12/16/19 1405 12/16/19 1527 12/16/19 2213 12/16/19 2213 12/17/19 0433 12/17/19 0433 12/17/19 02/16/20 12/17/19 0653 12/17/19 1436 12/17/19 2125 12/17/19 2312 12/18/19 0440  HGB   < >  --   --   --   --  14.8   < >  --   --   --  13.3  --  13.8  HCT   < >  --   --   --   --  44.2  --   --   --   --  38.9  --  39.6  PLT   < >  --   --   --   --  254  --   --   --   --  352  --  324  APTT  --   --  36  --   --   --   --   --   --   --   --   --   --   LABPROT  --   --  15.2  --   --   --   --   --   --   --   --   --   --   INR  --   --  1.3*  --   --   --   --   --   --   --   --   --   --   HEPARINUNFRC  --   --   --  0.48   < >  --   --  <0.10*   < > <0.10* 0.17*  --  0.67  CREATININE   < >  --   --  0.73   < >  --   --  0.65  --   --  0.58  --  0.56  CKTOTAL  --   --   --  116  --   --   --   --   --   --   --   --   --   TROPONINIHS  --  19*  --   --   --   --   --   --   --   --  12 12  --    < > = values in this interval not displayed.    Estimated Creatinine Clearance: 87 mL/min (by C-G formula based on SCr of 0.56 mg/dL).   Medical History: Past Medical History:  Diagnosis Date  . CHF (congestive heart failure) (HCC)   . Chronic back pain   . Diabetes mellitus without complication (HCC)   . Migraines   . No pertinent past medical history   . Pregnancy induced hypertension     Assessment: 34 year old female presented with chest pain. Imaging with small subsegmental level pulmonary emboli in the posterior and  lateral segments of the right lower lobe. Patient does not appear to be on any anticoagulation PTA. Pharmacy consulted to start heparin drip.  0430 2213 HL 0.48,  therapeutic x 1 0501 0653 HL <0.10 @ 1050 units/hr 05/01 @ 1527 HL <0.10. Subtherapeutic. Per floor nurse, no heparin interruptions. However, the nurse does suspect that the line was occluded as there was an alert for on the device. If line is occluded, it is unclear the true heparin level.  5/1 2125 HL 0.17, SUBtherapeutic. 5/2 0440 HL 0.67, therapeutic x 1.  CBC stable  Goal of Therapy:  Heparin level 0.3-0.7 units/ml Monitor platelets by anticoagulation protocol: Yes   Plan:  Heparin therapeutic x 1.  Will continue Heparin drip at current rate of 1500 units/hr, will recheck HL in ~6 hours to confirm.  CBC daily while on heparin drip.   Ena Dawley, PharmD Clinical Pharmacist 12/18/2019 6:11 AM

## 2019-12-18 NOTE — Progress Notes (Signed)
   12/17/19 1923  Clinical Encounter Type  Visited With Patient;Health care provider  Visit Type Initial  Referral From Nurse  Consult/Referral To Chaplain  Spiritual Encounters  Spiritual Needs Ritual  This was a RR PG. Upon arrival, CH met with Med staff and PT. At the time of arrival, PT was not responsive. CH stood at the door and said a silent prayer.  

## 2019-12-18 NOTE — Progress Notes (Signed)
Patient received from CCU via [facility] after receiving report. Alert and oriented but does complain of extreme pain with movement. Ambulated to bathroom to urinate, and given sandwich tray as she complained of being hungry. Assessment completed, weight obtained, patient attached to telemetry, which shows NSR/ST. Bilateral AC PIV's flushed and patent, and IVF (NS with 20 meq KCL) restarted st 75 cc/hr. PM meds administered and CBG checked; no SSI coverage indicated. Given Tylenol for pain. Will CTM.

## 2019-12-18 NOTE — Progress Notes (Addendum)
Progress Note  Patient Name: Cheryl Mercer Date of Encounter: 12/18/2019  Primary Cardiologist: No primary care provider on file. Dr. Gala Romney  Subjective   Feeling tired.  Breathing is improving.  She continues to have pain in her ribs.  Inpatient Medications    Scheduled Meds: . atorvastatin  40 mg Oral q1800  . carvedilol  6.25 mg Oral BID WC  . Chlorhexidine Gluconate Cloth  6 each Topical Daily  . digoxin  0.125 mg Oral Daily  . gabapentin  300 mg Oral TID  . insulin aspart  0-15 Units Subcutaneous TID WC  . insulin aspart  0-5 Units Subcutaneous QHS  . insulin aspart  10 Units Subcutaneous TID WC  . insulin glargine  50 Units Subcutaneous Daily  .  morphine injection  4 mg Intravenous Once  . PARoxetine  10 mg Oral Daily  . sacubitril-valsartan  1 tablet Oral BID  . sodium chloride flush  3 mL Intravenous Q12H  . spironolactone  25 mg Oral Daily   Continuous Infusions: . sodium chloride 10 mL/hr at 12/16/19 1534  . sodium chloride    . sodium chloride    . 0.9 % NaCl with KCl 20 mEq / L 75 mL/hr at 12/18/19 0700  . azithromycin 500 mg (12/17/19 1747)  . cefTRIAXone (ROCEPHIN)  IV 2 g (12/17/19 1444)  . heparin 1,500 Units/hr (12/18/19 0926)  . norepinephrine (LEVOPHED) Adult infusion 2 mcg/min (12/18/19 0700)   PRN Meds: sodium chloride, acetaminophen, cyclobenzaprine, naLOXone (NARCAN)  injection, ondansetron (ZOFRAN) IV, oxyCODONE, sodium chloride flush   Vital Signs    Vitals:   12/18/19 0900 12/18/19 0930 12/18/19 1000 12/18/19 1030  BP: 107/71 110/75  105/64  Pulse: 90 91  87  Resp: 13 11 20    Temp:      TempSrc:      SpO2: 100% 100%  99%  Weight:      Height:        Intake/Output Summary (Last 24 hours) at 12/18/2019 1141 Last data filed at 12/18/2019 0926 Gross per 24 hour  Intake 3177.63 ml  Output 150 ml  Net 3027.63 ml   Last 3 Weights 12/18/2019 12/17/2019 12/16/2019  Weight (lbs) 142 lb 10.2 oz 139 lb 4.8 oz 162 lb  Weight (kg)  64.7 kg 63.186 kg 73.483 kg      Telemetry    Sinus rhythm.  No events- Personally Reviewed  ECG    12/17/2019: Sinus rhythm.  Rate 85 bpm.  Lateral T wave abnormalities. - Personally Reviewed  Physical Exam   VS:  BP 105/64   Pulse 87   Temp 98.4 F (36.9 C) (Axillary)   Resp 20   Ht 5\' 1"  (1.549 m)   Wt 64.7 kg   SpO2 99%   BMI 26.95 kg/m  , BMI Body mass index is 26.95 kg/m. GENERAL: Lethargic appearing HEENT: Pupils equal round and reactive, fundi not visualized, oral mucosa unremarkable NECK:  No jugular venous distention, waveform within normal limits, carotid upstroke brisk and symmetric, no bruits LUNGS:  Clear to auscultation bilaterally HEART:  RRR.  PMI not displaced or sustained,S1 and S2 within normal limits, no S3, no S4, no clicks, no rubs, no murmurs ABD:  Flat, positive bowel sounds normal in frequency in pitch, no bruits, no rebound, no guarding, no midline pulsatile mass, no hepatomegaly, no splenomegaly EXT:  2 plus pulses throughout, no edema, no cyanosis no clubbing SKIN:  No rashes no nodules NEURO:  Cranial nerves II through  XII grossly intact, motor grossly intact throughout PSYCH:  Cognitively intact, oriented to person place and time   Labs    High Sensitivity Troponin:   Recent Labs  Lab 12/16/19 1217 12/16/19 1405 12/17/19 2125 12/17/19 2312  TROPONINIHS 19* 19* 12 12      Chemistry Recent Labs  Lab 12/17/19 0653 12/17/19 2125 12/18/19 0440  NA 128* 128* 131*  K 3.4* 3.5 3.3*  CL 93* 97* 99  CO2 25 22 25   GLUCOSE 258* 269* 227*  BUN 13 18 18   CREATININE 0.65 0.58 0.56  CALCIUM 7.7* 7.3* 7.6*  PROT  --  5.8*  --   ALBUMIN  --  2.1*  --   AST  --  48*  --   ALT  --  13  --   ALKPHOS  --  104  --   BILITOT  --  0.8  --   GFRNONAA >60 >60 >60  GFRAA >60 >60 >60  ANIONGAP 10 9 7      Hematology Recent Labs  Lab 12/17/19 0433 12/17/19 2125 12/18/19 0440  WBC 13.7* 18.6* 16.1*  RBC 4.98 4.50 4.65  HGB 14.8 13.3 13.8    HCT 44.2 38.9 39.6  MCV 88.8 86.4 85.2  MCH 29.7 29.6 29.7  MCHC 33.5 34.2 34.8  RDW 13.3 13.3 13.4  PLT 254 352 324    BNP Recent Labs  Lab 12/16/19 1217 12/18/19 0440  BNP 938.0* 195.0*     DDimer No results for input(s): DDIMER in the last 168 hours.   Radiology    DG Chest 1 View  Result Date: 12/17/2019 CLINICAL DATA:  Apnea EXAM: CHEST  1 VIEW COMPARISON:  12/16/2019 FINDINGS: Cardiomegaly. Probable mild bibasilar atelectasis. The visualized skeletal structures are unremarkable. IMPRESSION: Cardiomegaly.  Probable mild bibasilar atelectasis. Electronically Signed   By: 02/17/20 M.D.   On: 12/17/2019 20:27   DG Chest 2 View  Result Date: 12/16/2019 CLINICAL DATA:  Chest pain, shortness of breath, RIGHT-side rib and chest pain with inspiration, accidentally kicked in RIGHT side while sleeping, history diabetes mellitus, hypertension EXAM: CHEST - 2 VIEW COMPARISON:  11/07/2019 FINDINGS: Enlargement of cardiac silhouette. Mediastinal contours and pulmonary vascularity normal. Lungs clear. No pulmonary infiltrate, pleural effusion or pneumothorax. Osseous structures unremarkable. IMPRESSION: No acute abnormalities. Electronically Signed   By: 02/16/2020 M.D.   On: 12/16/2019 12:54   CT Angio Chest PE W and/or Wo Contrast  Result Date: 12/16/2019 CLINICAL DATA:  Shortness of breath EXAM: CT ANGIOGRAPHY CHEST WITH CONTRAST TECHNIQUE: Multidetector CT imaging of the chest was performed using the standard protocol during bolus administration of intravenous contrast. Multiplanar CT image reconstructions and MIPs were obtained to evaluate the vascular anatomy. CONTRAST:  62mL OMNIPAQUE IOHEXOL 350 MG/ML SOLN COMPARISON:  Chest radiograph December 16, 2019 and CT angiogram chest September 06, 2019 FINDINGS: Cardiovascular: There are small peripheral subsegmental pulmonary emboli in the lateral and posterior segments of the right lower lobe. No more central pulmonary emboli evident. No  evident right heart strain. No thoracic aortic aneurysm or dissection. Visualized great vessels appear unremarkable. Note that the right innominate and left common carotid arteries arise as a common trunk, an anatomic variant. The main pulmonary outflow tract measures 3.2 cm in diameter, prominent. Mediastinum/Nodes: Thyroid appears unremarkable. No evident thoracic adenopathy. There is a small hiatal hernia. Lungs/Pleura: There is mild patchy airspace opacity in each lower lobe. A somewhat wedge-shaped area of airspace opacity is noted in the posterior aspect of the  superior segment of the right lower lobe. Lungs elsewhere are clear. No pleural effusions. Upper Abdomen: Visualized upper abdominal structures appear unremarkable. Musculoskeletal: No blastic or lytic bone lesions. No evident chest wall lesions. Review of the MIP images confirms the above findings. IMPRESSION: 1. Small subsegmental level pulmonary emboli in the posterior and lateral segments of the right lower lobe. No more central pulmonary emboli evident on either side. 2. Significantly less infiltrate compared to prior CT. There is relatively mild airspace opacity in each lower lobe, probably due to residua from atypical organism pneumonia. Somewhat wedge-shaped area of increased opacity noted abutting the pleura in the superior segment right lower lobe. Suspect pneumonia in this area. Infarct could present in this manner, although there is currently no pulmonary embolus supplying this area. Potential infarct from residual pulmonary embolus which has recanalized is conceivable. 3. Prominence of the main pulmonary outflow tract, a finding indicative of a degree of pulmonary arterial hypertension. 4.  No evident adenopathy. 5.  Small hiatal hernia. Critical Value/emergent results were called by telephone at the time of interpretation on 12/16/2019 at 3:04 pm to provider Jene Every , who verbally acknowledged these results. Electronically Signed    By: Bretta Bang III M.D.   On: 12/16/2019 15:06    Cardiac Studies   Echo 10/2019: IMPRESSIONS  1. Left ventricular ejection fraction, by estimation, is 20 to 25%. The  left ventricle has severely decreased function. The left ventricle  demonstrates global hypokinesis. The left ventricular internal cavity size  was mildly dilated. There is mild left  ventricular hypertrophy. Indeterminate diastolic filling due to E-A  fusion.  2. Right ventricular systolic function is moderately reduced. The right  ventricular size is normal. There is mildly elevated pulmonary artery  systolic pressure. The estimated right ventricular systolic pressure is  35.5 mmHg.  3. Left atrial size was mildly dilated.  4. The mitral valve is normal in structure. Mild mitral valve  regurgitation. No evidence of mitral stenosis.  5. The aortic valve is tricuspid. Aortic valve regurgitation is not  visualized. No aortic stenosis is present.  6. The inferior vena cava is normal in size with <50% respiratory  variability, suggesting right atrial pressure of 8 mmHg.   Patient Profile     Ms. Corti is a 52F with chronic systolic and diastolic heart failure, diabetes, hypertension, and recent COVID-19 pneumonia admitted with acute pulmonary embolism.   Assessment & Plan    # Acute pulmonary embolism: # Acute hypoxic respiratory failure: # Recent COVID-19 pneumonia: She was admitted with acute right-sided pulmonary embolism after reporting severe right chest pain.  CT scan showed small subsegmental pulmonary emboli in the posterior and lateral segments of the right lower lobe.  There is no central emboli.  There was less infiltrate than on her prior CT.  There was a wedge-shaped area concerning for residual infection versus infarct.  Pulmonary hypertension was also noted.  From a heart failure standpoint she is doing well.  She is euvolemic to dry.  She did receive several doses of Lasix and repeat BNP  was only 195.  She did develop apnea overnight, likely attributable to the oxycodone she was receiving for pain control.  She was transferred to the ICU and required intubation.  She was subsequently extubated. She remains sleepy but arousable.  There are no plans for procedures.  Heparin levels have been subtherapeutic.  Recommend transitioning to Xarelto.  Nonopiates to be  Used for pain control..  # Chronic systolic and  diastolic heart failure: #Essential hypertension: Euvolemic.  Holding diuretics.  She only takes lasix as needed.  Her home Entresto and carvedilol were held this morning.  She did require Levophed overnight and a fluid bolus.  If her blood pressure is >110 mmHg would give her cardiac meds tonight.  Carvedilol and Entresto are most important.  Will resume spironolactone if BP allows.  Continue digoxin.     Time spent: 35 minutes-Greater than 50% of this time was spent in counseling, explanation of diagnosis, planning of further management, and coordination of care.  Her husband was updated at the bedside.   For questions or updates, please contact [place] Please consult [email or web address] for contact info under        Signed, Skeet Latch, MD  12/18/2019, 11:41 AM

## 2019-12-18 NOTE — Progress Notes (Addendum)
ANTICOAGULATION CONSULT NOTE  Pharmacy Consult for heparin Indication: pulmonary embolus  Allergies  Allergen Reactions  . Ibuprofen Anaphylaxis   Patient Measurements: Height: 5\' 1"  (154.9 cm) Weight: 64.7 kg (142 lb 10.2 oz) IBW/kg (Calculated) : 47.8 Heparin Dosing Weight: 64 kg  Vital Signs: Temp: 97.7 F (36.5 C) (05/02 1300) Temp Source: Axillary (05/02 1300) BP: 112/71 (05/02 1300) Pulse Rate: 82 (05/02 1300)  Labs: Recent Labs    12/16/19 1217 12/16/19 1405 12/16/19 1527 12/16/19 2213 12/16/19 2213 12/17/19 0433 12/17/19 0653 12/17/19 1436 12/17/19 2125 12/17/19 2312 12/18/19 0440 12/18/19 1132  HGB   < >  --   --   --    < > 14.8  --   --  13.3  --  13.8  --   HCT   < >  --   --   --   --  44.2  --   --  38.9  --  39.6  --   PLT   < >  --   --   --   --  254  --   --  352  --  324  --   APTT  --   --  36  --   --   --   --   --   --   --   --   --   LABPROT  --   --  15.2  --   --   --   --   --   --   --   --   --   INR  --   --  1.3*  --   --   --   --   --   --   --   --   --   HEPARINUNFRC  --   --   --  0.48   < >  --  <0.10*   < > 0.17*  --  0.67 0.72*  CREATININE   < >  --   --  0.73   < >  --  0.65  --  0.58  --  0.56  --   CKTOTAL  --   --   --  116  --   --   --   --   --   --   --   --   TROPONINIHS  --  19*  --   --   --   --   --   --  12 12  --   --    < > = values in this interval not displayed.    Estimated Creatinine Clearance: 87 mL/min (by C-G formula based on SCr of 0.56 mg/dL).   Medical History: Past Medical History:  Diagnosis Date  . CHF (congestive heart failure) (HCC)   . Chronic back pain   . Diabetes mellitus without complication (HCC)   . Migraines   . No pertinent past medical history   . Pregnancy induced hypertension     Assessment: 34 year old female presented with chest pain. Imaging with small subsegmental level pulmonary emboli in the posterior and lateral segments of the right lower lobe. Patient does not  appear to be on any anticoagulation PTA. Pharmacy consulted to start heparin drip.  0430 2213 HL 0.48, therapeutic x 1 0501 0653 HL <0.10 @ 1050 units/hr 05/01 @ 1527 HL <0.10. Subtherapeutic. Per floor nurse, no heparin interruptions. However, the nurse does suspect that the line was occluded  as there was an alert for on the device. If line is occluded, it is unclear the true heparin level.  5/1 2125 HL 0.17, SUBtherapeutic. 5/2 0440 HL 0.67, therapeutic x 1.  CBC stable 5/2 1132 HL 0.72, supratherapeutic  Goal of Therapy:  Heparin level 0.3-0.7 units/ml Monitor platelets by anticoagulation protocol: Yes   Plan:  HL just slightly supratherapeutic. Will decrease heparin drip to 1400 units/hr and recheck HL at 1900.   UPDATE 5/2 1400 - Patient to transition to Eliquis. Will start 10 mg BID x 7 days followed by 5 mg BID. Monitor CBC per protocol.   Tawnya Crook, PharmD Clinical Pharmacist 12/18/2019 1:08 PM

## 2019-12-18 NOTE — Progress Notes (Signed)
Pt sleeping intermittently. Husband at bedside. A&OX4. Updated on POC. IVs intact. Heparin continuous IV drip intact. Complains of R rib pain. Tylenol administered. VSS. Blood pressure stable with levo off. Will continue to monitor.

## 2019-12-19 DIAGNOSIS — I5043 Acute on chronic combined systolic (congestive) and diastolic (congestive) heart failure: Secondary | ICD-10-CM | POA: Diagnosis not present

## 2019-12-19 LAB — CBC
HCT: 38.3 % (ref 36.0–46.0)
Hemoglobin: 13 g/dL (ref 12.0–15.0)
MCH: 29.8 pg (ref 26.0–34.0)
MCHC: 33.9 g/dL (ref 30.0–36.0)
MCV: 87.8 fL (ref 80.0–100.0)
Platelets: 342 10*3/uL (ref 150–400)
RBC: 4.36 MIL/uL (ref 3.87–5.11)
RDW: 13.9 % (ref 11.5–15.5)
WBC: 10.5 10*3/uL (ref 4.0–10.5)
nRBC: 0 % (ref 0.0–0.2)

## 2019-12-19 LAB — BASIC METABOLIC PANEL
Anion gap: 7 (ref 5–15)
BUN: 10 mg/dL (ref 6–20)
CO2: 24 mmol/L (ref 22–32)
Calcium: 7.8 mg/dL — ABNORMAL LOW (ref 8.9–10.3)
Chloride: 105 mmol/L (ref 98–111)
Creatinine, Ser: 0.5 mg/dL (ref 0.44–1.00)
GFR calc Af Amer: >60 mL/min (ref >60)
GFR calc non Af Amer: >60 mL/min (ref >60)
Glucose, Bld: 319 mg/dL — ABNORMAL HIGH (ref 70–99)
Potassium: 3.8 mmol/L (ref 3.5–5.1)
Sodium: 136 mmol/L (ref 135–145)

## 2019-12-19 LAB — GLUCOSE, CAPILLARY
Glucose-Capillary: 229 mg/dL — ABNORMAL HIGH (ref 70–99)
Glucose-Capillary: 148 mg/dL — ABNORMAL HIGH (ref 70–99)
Glucose-Capillary: 118 mg/dL — ABNORMAL HIGH (ref 70–99)
Glucose-Capillary: 180 mg/dL — ABNORMAL HIGH (ref 70–99)

## 2019-12-19 LAB — PROCALCITONIN: Procalcitonin: 0.35 ng/mL

## 2019-12-19 MED ORDER — SENNA 8.6 MG PO TABS
1.0000 | ORAL_TABLET | Freq: Every day | ORAL | Status: DC
  Administered 2019-12-19 – 2019-12-20 (×2): 8.6 mg via ORAL
  Filled 2019-12-19 (×2): qty 1

## 2019-12-19 MED ORDER — AZITHROMYCIN 250 MG PO TABS
500.0000 mg | ORAL_TABLET | Freq: Every day | ORAL | Status: AC
  Administered 2019-12-19 – 2019-12-20 (×2): 500 mg via ORAL
  Filled 2019-12-19 (×2): qty 2

## 2019-12-19 NOTE — Telephone Encounter (Signed)
Unable to reach closing encounter 

## 2019-12-19 NOTE — Plan of Care (Signed)
  Problem: Clinical Measurements: Goal: Ability to maintain clinical measurements within normal limits will improve Outcome: Progressing   Problem: Clinical Measurements: Goal: Respiratory complications will improve Outcome: Progressing   

## 2019-12-19 NOTE — Progress Notes (Signed)
PHARMACIST - PHYSICIAN COMMUNICATION DR:   Luberta Robertson CONCERNING: Antibiotic IV to Oral Route Change Policy  RECOMMENDATION: This patient is receiving Azithromycin by the intravenous route.  Based on criteria approved by the Pharmacy and Therapeutics Committee, the antibiotic(s) is/are being converted to the equivalent oral dose form(s).   DESCRIPTION: These criteria include:  Patient being treated for a respiratory tract infection, urinary tract infection, cellulitis or clostridium difficile associated diarrhea if on metronidazole  The patient is not neutropenic and does not exhibit a GI malabsorption state  The patient is eating (either orally or via tube) and/or has been taking other orally administered medications for a least 24 hours  The patient is improving clinically and has a Tmax < 100.5  If you have questions about this conversion, please contact the Pharmacy Department.  Albina Billet, PharmD, BCPS Clinical Pharmacist 12/19/2019 10:20 AM
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
## 2019-12-19 NOTE — Progress Notes (Signed)
Progress Note  Patient Name: Cheryl Mercer Date of Encounter: 12/19/2019  Primary Cardiologist: Bensimhon   Subjective   Breathing a little better this morning. Continues to note pleuritic chest pain. Transferred to the ICU going into 5/2 with decreased responsiveness and hypoxia requiring brief intubation, felt to be secondary to opiate use. Transferred out of the ICU 5/2.  Inpatient Medications    Scheduled Meds: . apixaban  10 mg Oral BID   Followed by  . [START ON 12/25/2019] apixaban  5 mg Oral BID  . atorvastatin  40 mg Oral q1800  . carvedilol  6.25 mg Oral BID WC  . Chlorhexidine Gluconate Cloth  6 each Topical Daily  . digoxin  0.125 mg Oral Daily  . gabapentin  300 mg Oral TID  . insulin aspart  0-15 Units Subcutaneous TID WC  . insulin aspart  0-5 Units Subcutaneous QHS  . insulin aspart  10 Units Subcutaneous TID WC  . insulin glargine  50 Units Subcutaneous Daily  .  morphine injection  4 mg Intravenous Once  . PARoxetine  10 mg Oral Daily  . sacubitril-valsartan  1 tablet Oral BID  . sodium chloride flush  3 mL Intravenous Q12H  . spironolactone  25 mg Oral Daily   Continuous Infusions: . sodium chloride 10 mL/hr at 12/16/19 1534  . sodium chloride    . sodium chloride    . 0.9 % NaCl with KCl 20 mEq / L 75 mL/hr at 12/19/19 0701  . azithromycin Stopped (12/18/19 1801)  . cefTRIAXone (ROCEPHIN)  IV Stopped (12/18/19 1516)  . norepinephrine (LEVOPHED) Adult infusion Stopped (12/18/19 0728)   PRN Meds: sodium chloride, acetaminophen, cyclobenzaprine, naLOXone (NARCAN)  injection, ondansetron (ZOFRAN) IV, oxyCODONE, sodium chloride flush   Vital Signs    Vitals:   12/18/19 2100 12/18/19 2143 12/18/19 2200 12/19/19 0512  BP: (!) 130/94 128/88  (!) 128/101  Pulse: (!) 107 (!) 103  (!) 102  Resp: 16 20  20   Temp:  98.9 F (37.2 C)  98.7 F (37.1 C)  TempSrc:  Oral  Oral  SpO2: 100% 99%  100%  Weight:   66.5 kg 67.5 kg  Height:   5\' 1"  (1.549  m)     Intake/Output Summary (Last 24 hours) at 12/19/2019 0735 Last data filed at 12/19/2019 0555 Gross per 24 hour  Intake 1649.75 ml  Output 1500 ml  Net 149.75 ml   Filed Weights   12/18/19 0500 12/18/19 2200 12/19/19 0512  Weight: 64.7 kg 66.5 kg 67.5 kg    Telemetry    Sinus tachycardia, low 100s bpm - Personally Reviewed  ECG    No new tracings - Personally Reviewed  Physical Exam   GEN: No acute distress.   Neck: No JVD. Cardiac: Mildly tachycardic, no murmurs, rubs, or gallops.  Respiratory: Clear to auscultation bilaterally.  GI: Soft, nontender, non-distended.   MS: No edema; No deformity. Neuro:  Alert and oriented x 3; Nonfocal.  Psych: Normal affect.  Labs    Chemistry Recent Labs  Lab 12/17/19 0653 12/17/19 2125 12/18/19 0440  NA 128* 128* 131*  K 3.4* 3.5 3.3*  CL 93* 97* 99  CO2 25 22 25   GLUCOSE 258* 269* 227*  BUN 13 18 18   CREATININE 0.65 0.58 0.56  CALCIUM 7.7* 7.3* 7.6*  PROT  --  5.8*  --   ALBUMIN  --  2.1*  --   AST  --  48*  --   ALT  --  13  --   ALKPHOS  --  104  --   BILITOT  --  0.8  --   GFRNONAA >60 >60 >60  GFRAA >60 >60 >60  ANIONGAP 10 9 7      Hematology Recent Labs  Lab 12/17/19 0433 12/17/19 2125 12/18/19 0440  WBC 13.7* 18.6* 16.1*  RBC 4.98 4.50 4.65  HGB 14.8 13.3 13.8  HCT 44.2 38.9 39.6  MCV 88.8 86.4 85.2  MCH 29.7 29.6 29.7  MCHC 33.5 34.2 34.8  RDW 13.3 13.3 13.4  PLT 254 352 324    Cardiac EnzymesNo results for input(s): TROPONINI in the last 168 hours. No results for input(s): TROPIPOC in the last 168 hours.   BNP Recent Labs  Lab 12/16/19 1217 12/18/19 0440  BNP 938.0* 195.0*     DDimer No results for input(s): DDIMER in the last 168 hours.   Radiology    DG Chest 1 View  Result Date: 12/17/2019 IMPRESSION: Cardiomegaly.  Probable mild bibasilar atelectasis. Electronically Signed   By: 02/16/2020 M.D.   On: 12/17/2019 20:27   DG Chest 2 View  Result Date:  12/16/2019 IMPRESSION: No acute abnormalities. Electronically Signed   By: 12/18/2019 M.D.   On: 12/16/2019 12:54   CT Angio Chest PE W and/or Wo Contrast  Result Date: 12/16/2019 IMPRESSION: 1. Small subsegmental level pulmonary emboli in the posterior and lateral segments of the right lower lobe. No more central pulmonary emboli evident on either side. 2. Significantly less infiltrate compared to prior CT. There is relatively mild airspace opacity in each lower lobe, probably due to residua from atypical organism pneumonia. Somewhat wedge-shaped area of increased opacity noted abutting the pleura in the superior segment right lower lobe. Suspect pneumonia in this area. Infarct could present in this manner, although there is currently no pulmonary embolus supplying this area. Potential infarct from residual pulmonary embolus which has recanalized is conceivable. 3. Prominence of the main pulmonary outflow tract, a finding indicative of a degree of pulmonary arterial hypertension. 4.  No evident adenopathy. 5.  Small hiatal hernia. Critical Value/emergent results were called by telephone at the time of interpretation on 12/16/2019 at 3:04 pm to provider 12/18/2019 , who verbally acknowledged these results. Electronically Signed   By: Jene Every III M.D.   On: 12/16/2019 15:06    Cardiac Studies   2D echo 11/07/2019: 1. Left ventricular ejection fraction, by estimation, is 20 to 25%. The  left ventricle has severely decreased function. The left ventricle  demonstrates global hypokinesis. The left ventricular internal cavity size  was mildly dilated. There is mild left ventricular hypertrophy. Indeterminate diastolic filling due to E-A fusion.  2. Right ventricular systolic function is moderately reduced. The right  ventricular size is normal. There is mildly elevated pulmonary artery  systolic pressure. The estimated right ventricular systolic pressure is  35.5 mmHg.  3. Left atrial  size was mildly dilated.  4. The mitral valve is normal in structure. Mild mitral valve  regurgitation. No evidence of mitral stenosis.  5. The aortic valve is tricuspid. Aortic valve regurgitation is not  visualized. No aortic stenosis is present.  6. The inferior vena cava is normal in size with <50% respiratory  variability, suggesting right atrial pressure of 8 mmHg.  Patient Profile     34 y.o. female with history of HFrEF secondary to NICM, poorly controlled IDDM, recent COVID-19 PNA in 08/2019, HTN, migraines and chronic back painwho was admitted with PE and  we are seeing today for the evaluation of CHF and right sided chest pain.  Assessment & Plan    1. Acute hypoxic respiratory failure/acute PE/recent COVID-19 PNA: -Admitted with acute subsegmental PE with decompensation over the weekend, possibly secondary to opiate use for pain requiring brief mechanical ventilation, now extubated and back on telemetry floor -She has been transitioned off heparin gtt and is now on PE dosed Eliquis -PE possibly in the setting of her recent COVID PNA -Avoid opiates for pain  2. 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 HTN: -Blood pressure improved -Continue medications as above  4. Elevated HS-TN: -Minimal bump in the setting of acute PE -Not consistent with ACS -No plans for inpatient ischemic evaluation   5. Hypokalemia: -Restart spironolactone as above  For questions or updates, please contact [place] Please consult [email or web address] for contact info under Cardiology/STEMI.    Signed, Christell Faith, PA-C [place] Pager: [phone] 12/19/2019, 7:35 AM

## 2019-12-20 ENCOUNTER — Inpatient Hospital Stay (HOSPITAL_COMMUNITY)
Admit: 2019-12-20 | Discharge: 2019-12-20 | Disposition: A | Discharge status: Home / Self Care | Payer: Medicaid Other | Attending: Internal Medicine | Admitting: Internal Medicine

## 2019-12-20 DIAGNOSIS — I2694 Multiple subsegmental pulmonary emboli without acute cor pulmonale: Secondary | ICD-10-CM | POA: Diagnosis not present

## 2019-12-20 DIAGNOSIS — I340 Nonrheumatic mitral (valve) insufficiency: Secondary | ICD-10-CM | POA: Diagnosis not present

## 2019-12-20 DIAGNOSIS — I361 Nonrheumatic tricuspid (valve) insufficiency: Secondary | ICD-10-CM

## 2019-12-20 DIAGNOSIS — I5043 Acute on chronic combined systolic (congestive) and diastolic (congestive) heart failure: Secondary | ICD-10-CM | POA: Diagnosis not present

## 2019-12-20 LAB — CBC
HCT: 36.8 % (ref 36.0–46.0)
Hemoglobin: 12.1 g/dL (ref 12.0–15.0)
MCH: 29.4 pg (ref 26.0–34.0)
MCHC: 32.9 g/dL (ref 30.0–36.0)
MCV: 89.3 fL (ref 80.0–100.0)
Platelets: 380 10*3/uL (ref 150–400)
RBC: 4.12 MIL/uL (ref 3.87–5.11)
RDW: 13.6 % (ref 11.5–15.5)
WBC: 10.2 10*3/uL (ref 4.0–10.5)
nRBC: 0 % (ref 0.0–0.2)

## 2019-12-20 LAB — URINE DRUG SCREEN, QUALITATIVE (ARMC ONLY)
Amphetamines, Ur Screen: NOT DETECTED
Barbiturates, Ur Screen: NOT DETECTED
Benzodiazepine, Ur Scrn: NOT DETECTED
Cannabinoid 50 Ng, Ur ~~LOC~~: NOT DETECTED
Cocaine Metabolite,Ur ~~LOC~~: NOT DETECTED
MDMA (Ecstasy)Ur Screen: NOT DETECTED
Methadone Scn, Ur: NOT DETECTED
Opiate, Ur Screen: NOT DETECTED
Phencyclidine (PCP) Ur S: NOT DETECTED
Tricyclic, Ur Screen: NOT DETECTED

## 2019-12-20 LAB — BASIC METABOLIC PANEL
Anion gap: 5 (ref 5–15)
BUN: 7 mg/dL (ref 6–20)
CO2: 25 mmol/L (ref 22–32)
Calcium: 8.5 mg/dL — ABNORMAL LOW (ref 8.9–10.3)
Chloride: 103 mmol/L (ref 98–111)
Creatinine, Ser: 0.46 mg/dL (ref 0.44–1.00)
GFR calc Af Amer: >60 mL/min (ref >60)
GFR calc non Af Amer: >60 mL/min (ref >60)
Glucose, Bld: 188 mg/dL — ABNORMAL HIGH (ref 70–99)
Potassium: 4.3 mmol/L (ref 3.5–5.1)
Sodium: 133 mmol/L — ABNORMAL LOW (ref 135–145)

## 2019-12-20 LAB — GLUCOSE, CAPILLARY
Glucose-Capillary: 156 mg/dL — ABNORMAL HIGH (ref 70–99)
Glucose-Capillary: 231 mg/dL — ABNORMAL HIGH (ref 70–99)
Glucose-Capillary: 165 mg/dL — ABNORMAL HIGH (ref 70–99)
Glucose-Capillary: 132 mg/dL — ABNORMAL HIGH (ref 70–99)

## 2019-12-20 NOTE — Progress Notes (Signed)
1300: Physician came out to inform this RN that patient seemed lethargic. This RN had administered PRN tylenol this am bc patient had complained of pain in her rib cage, upon re-assessment patient was asleep. During insulin administration patient was asleep but very arousalable. She woke up for me to administer insulin. Physician came out to inform me this after my assessment. Press photographer, Building services engineer were informed of this incident.   Update 1320: Reassessment- patient sitting up eating her lunch. Very alert and oriented. No signs of distress. Awaiting for MD to call back to inform of the search policy.
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
## 2019-12-20 NOTE — Progress Notes (Signed)
   12/20/19 1600  Clinical Encounter Type  Visited With Patient  Visit Type Initial;Spiritual support  Referral From Nurse  Consult/Referral To Chaplain  Nurse [facility] asked chaplain to visit with patient. Chaplain stopped and spoke with patient. Patient said the nurses in ICU were rude to her and accused her of using drugs. Patient said she would never use drugs and she is not sure what pill was found. Patient said the nurse yelled at her. While talking to chaplain, patient began crying. The chaplain apologized for how she was made to feel and told her we would do the best we can to help her get better. Chaplain asked if she could pray with her and she said yes. Chaplain prayed with patient and told her she will check in on her tomorrow.

## 2019-12-20 NOTE — Progress Notes (Signed)
Leadership and primary nurse rounded on patient to assess mental status. She was in a small amount of pain but was alert and oriented. We told her that we worriedabout her and wanted her to be safe while she was here on our dept. and proceeded to ask the  patient if she would allow [place] to search her belongings  In her presence  at the bedside for medications that were not prescribe by her MD. She assisted [place] in looking through 2 belonging bags and a purse. We did not see any narcotics. She had 3 blue aleve pills in a packet. Patient  became very teary- eyed and felt like she has been judged by nursing on Saturday night. I told her that we were very appreciative of her allowing [place] to check her belonging to make sure she was safe with no additional medications in her bags. I asked if she would  like to speak with a chaplin and she agreed. Chaplin consult was orded.

## 2019-12-20 NOTE — Progress Notes (Signed)
Progress Note  Patient Name: Cheryl Mercer Date of Encounter: 12/20/2019  Primary Cardiologist: Bensimhon   Subjective   Breathing continues to improve, though she still notes pleuritic chest pain. Ambulated in her room reasonably well last evening. BP has been well controlled. She has tolerated resumption of GDMT without issues.  Inpatient Medications    Scheduled Meds: . apixaban  10 mg Oral BID   Followed by  . [START ON 12/25/2019] apixaban  5 mg Oral BID  . atorvastatin  40 mg Oral q1800  . azithromycin  500 mg Oral Daily  . carvedilol  6.25 mg Oral BID WC  . Chlorhexidine Gluconate Cloth  6 each Topical Daily  . digoxin  0.125 mg Oral Daily  . gabapentin  300 mg Oral TID  . insulin aspart  0-15 Units Subcutaneous TID WC  . insulin aspart  0-5 Units Subcutaneous QHS  . insulin aspart  10 Units Subcutaneous TID WC  . insulin glargine  50 Units Subcutaneous Daily  .  morphine injection  4 mg Intravenous Once  . PARoxetine  10 mg Oral Daily  . sacubitril-valsartan  1 tablet Oral BID  . senna  1 tablet Oral QHS  . sodium chloride flush  3 mL Intravenous Q12H  . spironolactone  25 mg Oral Daily   Continuous Infusions: . sodium chloride    . cefTRIAXone (ROCEPHIN)  IV 2 g (12/19/19 1541)   PRN Meds: sodium chloride, acetaminophen, cyclobenzaprine, naLOXone (NARCAN)  injection, ondansetron (ZOFRAN) IV, oxyCODONE, sodium chloride flush   Vital Signs    Vitals:   12/20/19 0436 12/20/19 0749 12/20/19 0800 12/20/19 0811  BP: 124/79 (!) 133/96    Pulse: (!) 108 (!) 112    Resp: 20 18 20 18   Temp: 100 F (37.8 C) (!) 100.4 F (38 C)    TempSrc: Oral Oral    SpO2: 98% 99%    Weight:      Height:        Intake/Output Summary (Last 24 hours) at 12/20/2019 0939 Last data filed at 12/20/2019 0835 Gross per 24 hour  Intake 4114.73 ml  Output 1600 ml  Net 2514.73 ml   Filed Weights   12/18/19 2200 12/19/19 0512 12/20/19 0253  Weight: 66.5 kg 67.5 kg 68.6 kg      Telemetry    Sinus tachycardia, low 100s bpm - Personally Reviewed  ECG    No new tracings - Personally Reviewed  Physical Exam   GEN: No acute distress.   Neck: No JVD. Cardiac: Mildly tachycardic, no murmurs, rubs, or gallops.  Respiratory: Clear to auscultation bilaterally.  GI: Soft, nontender, non-distended.   MS: No edema; No deformity. Neuro:  Alert and oriented x 3; Nonfocal.  Psych: Normal affect.  Labs    Chemistry Recent Labs  Lab 12/17/19 2125 12/17/19 2125 12/18/19 0440 12/19/19 0702 12/20/19 0606  NA 128*   < > 131* 136 133*  K 3.5   < > 3.3* 3.8 4.3  CL 97*   < > 99 105 103  CO2 22   < > 25 24 25   GLUCOSE 269*   < > 227* 319* 188*  BUN 18   < > 18 10 7   CREATININE 0.58   < > 0.56 0.50 0.46  CALCIUM 7.3*   < > 7.6* 7.8* 8.5*  PROT 5.8*  --   --   --   --   ALBUMIN 2.1*  --   --   --   --  AST 48*  --   --   --   --   ALT 13  --   --   --   --   ALKPHOS 104  --   --   --   --   BILITOT 0.8  --   --   --   --   GFRNONAA >60   < > >60 >60 >60  GFRAA >60   < > >60 >60 >60  ANIONGAP 9   < > 7 7 5    < > = values in this interval not displayed.     Hematology Recent Labs  Lab 12/18/19 0440 12/19/19 0702 12/20/19 0606  WBC 16.1* 10.5 10.2  RBC 4.65 4.36 4.12  HGB 13.8 13.0 12.1  HCT 39.6 38.3 36.8  MCV 85.2 87.8 89.3  MCH 29.7 29.8 29.4  MCHC 34.8 33.9 32.9  RDW 13.4 13.9 13.6  PLT 324 342 380    Cardiac EnzymesNo results for input(s): TROPONINI in the last 168 hours. No results for input(s): TROPIPOC in the last 168 hours.   BNP Recent Labs  Lab 12/16/19 1217 12/18/19 0440  BNP 938.0* 195.0*     DDimer No results for input(s): DDIMER in the last 168 hours.   Radiology    DG Chest 1 View  Result Date: 12/17/2019 IMPRESSION: Cardiomegaly.  Probable mild bibasilar atelectasis. Electronically Signed   By: 02/16/2020 M.D.   On: 12/17/2019 20:27   DG Chest 2 View  Result Date: 12/16/2019 IMPRESSION: No acute  abnormalities. Electronically Signed   By: 12/18/2019 M.D.   On: 12/16/2019 12:54   CT Angio Chest PE W and/or Wo Contrast  Result Date: 12/16/2019 IMPRESSION: 1. Small subsegmental level pulmonary emboli in the posterior and lateral segments of the right lower lobe. No more central pulmonary emboli evident on either side. 2. Significantly less infiltrate compared to prior CT. There is relatively mild airspace opacity in each lower lobe, probably due to residua from atypical organism pneumonia. Somewhat wedge-shaped area of increased opacity noted abutting the pleura in the superior segment right lower lobe. Suspect pneumonia in this area. Infarct could present in this manner, although there is currently no pulmonary embolus supplying this area. Potential infarct from residual pulmonary embolus which has recanalized is conceivable. 3. Prominence of the main pulmonary outflow tract, a finding indicative of a degree of pulmonary arterial hypertension. 4.  No evident adenopathy. 5.  Small hiatal hernia. Critical Value/emergent results were called by telephone at the time of interpretation on 12/16/2019 at 3:04 pm to provider 12/18/2019 , who verbally acknowledged these results. Electronically Signed   By: Jene Every III M.D.   On: 12/16/2019 15:06    Cardiac Studies   2D echo 11/07/2019: 1. Left ventricular ejection fraction, by estimation, is 20 to 25%. The  left ventricle has severely decreased function. The left ventricle  demonstrates global hypokinesis. The left ventricular internal cavity size  was mildly dilated. There is mild left ventricular hypertrophy. Indeterminate diastolic filling due to E-A fusion.  2. Right ventricular systolic function is moderately reduced. The right  ventricular size is normal. There is mildly elevated pulmonary artery  systolic pressure. The estimated right ventricular systolic pressure is  35.5 mmHg.  3. Left atrial size was mildly dilated.  4. The  mitral valve is normal in structure. Mild mitral valve  regurgitation. No evidence of mitral stenosis.  5. The aortic valve is tricuspid. Aortic valve regurgitation is not  visualized.  No aortic stenosis is present.  6. The inferior vena cava is normal in size with <50% respiratory  variability, suggesting right atrial pressure of 8 mmHg.  Patient Profile     34 y.o. female with history of HFrEF secondary to NICM, poorly controlled IDDM, recent COVID-19 PNA in 08/2019, HTN, migraines and chronic back painwho was admitted with PE and we are seeing today for the evaluation of CHF and right sided chest pain.  Assessment & Plan    1. Acute hypoxic respiratory failure/acute PE/recent COVID-19 PNA: -Admitted with acute subsegmental PE with decompensation over the weekend, possibly secondary to opiate use for pain requiring brief mechanical ventilation, now extubated and back on telemetry floor -She has been transitioned off heparin gtt and is now on PE dosed Eliquis -PE possibly in the setting of her recent COVID PNA -Avoid opiates for pain  2. 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 HTN: -Blood pressure improved -Continue medications as above  4. Elevated HS-TN: -Minimal bump in the setting of acute PE -Not consistent with ACS -No plans for inpatient ischemic evaluation   5. Hypokalemia: -Restart spironolactone as above  For questions or updates, please contact [facility] Please consult [email or web address] for contact info under Cardiology/STEMI.    Signed, Christell Faith, PA-C [place] Pager: [phone] 12/20/2019, 9:39 AM

## 2019-12-21 LAB — BASIC METABOLIC PANEL
Anion gap: 8 (ref 5–15)
BUN: 6 mg/dL (ref 6–20)
CO2: 25 mmol/L (ref 22–32)
Calcium: 8.5 mg/dL — ABNORMAL LOW (ref 8.9–10.3)
Chloride: 100 mmol/L (ref 98–111)
Creatinine, Ser: 0.43 mg/dL — ABNORMAL LOW (ref 0.44–1.00)
GFR calc Af Amer: >60 mL/min (ref >60)
GFR calc non Af Amer: >60 mL/min (ref >60)
Glucose, Bld: 219 mg/dL — ABNORMAL HIGH (ref 70–99)
Potassium: 4.3 mmol/L (ref 3.5–5.1)
Sodium: 133 mmol/L — ABNORMAL LOW (ref 135–145)

## 2019-12-21 LAB — CULTURE, BLOOD (ROUTINE X 2)
Culture: NO GROWTH
Special Requests: ADEQUATE

## 2019-12-21 LAB — CBC
HCT: 37.4 % (ref 36.0–46.0)
Hemoglobin: 12.7 g/dL (ref 12.0–15.0)
MCH: 29.9 pg (ref 26.0–34.0)
MCHC: 34 g/dL (ref 30.0–36.0)
MCV: 88 fL (ref 80.0–100.0)
Platelets: 458 10*3/uL — ABNORMAL HIGH (ref 150–400)
RBC: 4.25 MIL/uL (ref 3.87–5.11)
RDW: 13.3 % (ref 11.5–15.5)
WBC: 11.8 10*3/uL — ABNORMAL HIGH (ref 4.0–10.5)
nRBC: 0 % (ref 0.0–0.2)

## 2019-12-21 LAB — GLUCOSE, CAPILLARY
Glucose-Capillary: 177 mg/dL — ABNORMAL HIGH (ref 70–99)
Glucose-Capillary: 245 mg/dL — ABNORMAL HIGH (ref 70–99)
Glucose-Capillary: 186 mg/dL — ABNORMAL HIGH (ref 70–99)

## 2019-12-21 LAB — ECHOCARDIOGRAM COMPLETE
Height: 61 in
Weight: 2419.2 oz

## 2019-12-21 LAB — URINE DRUG SCREEN, QUALITATIVE (ARMC ONLY)
Amphetamines, Ur Screen: NOT DETECTED
Barbiturates, Ur Screen: NOT DETECTED
Benzodiazepine, Ur Scrn: NOT DETECTED
Cannabinoid 50 Ng, Ur ~~LOC~~: NOT DETECTED
Cocaine Metabolite,Ur ~~LOC~~: NOT DETECTED
MDMA (Ecstasy)Ur Screen: NOT DETECTED
Methadone Scn, Ur: NOT DETECTED
Opiate, Ur Screen: NOT DETECTED
Phencyclidine (PCP) Ur S: NOT DETECTED
Tricyclic, Ur Screen: NOT DETECTED

## 2019-12-21 MED ORDER — FUROSEMIDE 20 MG PO TABS: 20.0000 mg | ORAL_TABLET | Freq: Every day | ORAL | 1 refills | Status: DC

## 2019-12-21 MED ORDER — APIXABAN 5 MG PO TABS: 10.0000 mg | ORAL_TABLET | Freq: Two times a day (BID) | ORAL | 1 refills | Status: DC

## 2019-12-21 MED ORDER — FUROSEMIDE 20 MG PO TABS
20.0000 mg | ORAL_TABLET | Freq: Every day | ORAL | Status: DC
  Administered 2019-12-21: 20 mg via ORAL
  Filled 2019-12-21: qty 1

## 2019-12-21 NOTE — Progress Notes (Signed)
   12/21/19 1400  Clinical Encounter Type  Visited With Patient  Visit Type Follow-up  Referral From Chaplain  Consult/Referral To Chaplain  While rounding chaplain visited with patient. Patient said she feels much better since she knows someone believes her about not using drugs. Patient said her doctor said she may be ready for discharge tomorrow. Chaplain encouraged her to rest and told patient she would check on her tomorrow.

## 2019-12-21 NOTE — Progress Notes (Signed)
ANTICOAGULATION CONSULT NOTE  Pharmacy Consult for Eliquis Indication: pulmonary embolus  Allergies  Allergen Reactions  . Ibuprofen Anaphylaxis   Patient Measurements: Height: 5\' 1"  (154.9 cm) Weight: 67.5 kg (148 lb 14.4 oz) IBW/kg (Calculated) : 47.8 Heparin Dosing Weight: 64 kg  Vital Signs: Temp: 98.4 F (36.9 C) (05/05 0744) Temp Source: Oral (05/05 0744) BP: 128/87 (05/05 0744) Pulse Rate: 94 (05/05 0744)  Labs: Recent Labs    12/18/19 1132 12/19/19 0702 12/19/19 0702 12/20/19 0606 12/21/19 0555  HGB  --  13.0   < > 12.1 12.7  HCT  --  38.3  --  36.8 37.4  PLT  --  342  --  380 458*  HEPARINUNFRC 0.72*  --   --   --   --   CREATININE  --  0.50  --  0.46 0.43*   < > = values in this interval not displayed.    Estimated Creatinine Clearance: 88.8 mL/min (A) (by C-G formula based on SCr of 0.43 mg/dL (L)).   Medical History: Past Medical History:  Diagnosis Date  . CHF (congestive heart failure) (HCC)   . Chronic back pain   . Diabetes mellitus without complication (HCC)   . Migraines   . No pertinent past medical history   . Pregnancy induced hypertension     Assessment: 34 year old female presented with chest pain. Imaging with small subsegmental level pulmonary emboli in the posterior and lateral segments of the right lower lobe.  Pharmacy consulted for heparin drip from 4/30>5/2.  Eliquis started 5/2.    Goal of Therapy:  Monitor platelets by anticoagulation protocol: Yes   Plan:  Continue Eliquis.  HGB/Platelet stable.   Will continue 10 mg BID x 4 more days followed by 5 mg BID.  CBC per protocol.   7/2, PharmD Clinical Pharmacist 12/21/2019 9:09 AM

## 2019-12-21 NOTE — Discharge Summary (Signed)
Physician Discharge Summary  DAYLIN EADS DXI:[id number] DOB: Oct 17, 1985 DOA: 12/16/2019  PCP: Lorelee Market, MD  Admit date: 12/16/2019 Discharge date: 12/21/2019  Admitted From: Home Disposition:  Home  Recommendations for Outpatient Follow-up:  1. Follow up with PCP in 1-2 weeks 2. Follow-up in heart failure clinic. 3. Please obtain BMP/CBC in one week 4. Please follow up on the following pending results: None  Home Health: No Equipment/Devices: None  Discharge Condition: Stable CODE STATUS: Full  Diet recommendation: Heart Healthy / Carb Modified   Brief/Interim Summary: Cheryl Mercer is an 34 y.o. female past medical history significant for diabetes, hypertension and nonischemic cardiomyopathy with EF of 20 to 25% who was recently discharged from her facility on 11/10/2019.  Patient did not follow-up at her heart failure clinic, presented to ED with abrupt onset of right-sided chest pain.  She was found to have small subsegmental level PE in the posterior and lateral segments of right lower lobe.  Wedge-shaped area of increased opacity abusing the pleura in the superior segment of right lower lobe.  Due to concern of pneumonia she was also treated with azithromycin and ceftriaxone, completed the course during hospitalization. She was initially treated with heparin infusion and later switched to Eliquis.  She will continue with Eliquis 10 mg twice daily for 12 more days and then decrease dose to 5 mg twice daily.  She needs to follow-up with her primary care physician for further management.  During current hospitalization she was transferred to ICU overnight due to decreased level of responsiveness and hypoxia most likely related to opioid dose.  Patient was treated with oxycodone and morphine for chest pain.  There was some concern of illicit drug use.  UDS initially was positive for tricyclic, amphetamine and opioids.  Cardiology was consulted due to her  nonischemic cardiomyopathy diagnosed in August 2020.  Repeat echo with EF of 25 to 30% with grade 2 diastolic dysfunction.  She was diuresed with IV Lasix initially and then switched to p.o. She will continue rest of her home meds and will follow up at heart failure clinic.  Her urine culture was positive with E. coli.  She completed a course of ceftriaxone while in the hospital.  Patient has uncontrolled diabetes with A1c of 13.8.  She will continue her home medications and need to follow-up with her PCP for further management.  Discharge Diagnoses:  Active Problems:   Lower urinary tract infectious disease   Uncontrolled type 2 diabetes mellitus with hyperglycemia (HCC)   Acute on chronic combined systolic and diastolic CHF (congestive heart failure) (HCC)   Essential hypertension   Atypical pneumonia   Dilated cardiomyopathy (HCC)   Pulmonary embolus ([place])   Community acquired pneumonia   Pulmonary infarct ([place])   Apnea   Discharge Instructions  Discharge Instructions    (HEART FAILURE PATIENTS) Call MD:  Anytime you have any of the following symptoms: 1) 3 pound weight gain in 24 hours or 5 pounds in 1 week 2) shortness of breath, with or without a dry hacking cough 3) swelling in the hands, feet or stomach 4) if you have to sleep on extra pillows at night in order to breathe.   Complete by: As directed    AMB referral to CHF clinic   Complete by: As directed    Diet - low sodium heart healthy   Complete by: As directed    Discharge instructions   Complete by: As directed    It was pleasure taking  care of you. Please take your medications as directed and follow-up in heart failure clinic. For your clot in your lung you were given a blood thinner called Eliquis, take it 2 tablets twice daily for 12 days and then decrease the dose to 1 tablet twice daily. It is very important that you do not take any street drugs or medications except prescribed to you. Follow-up with your  primary care physician in 2 weeks.   Increase activity slowly   Complete by: As directed      Allergies as of 12/21/2019      Reactions   Ibuprofen Anaphylaxis      Medication List    TAKE these medications   apixaban 5 MG Tabs tablet Commonly known as: ELIQUIS Take 2 tablets (10 mg total) by mouth 2 (two) times daily. For 12 days and then take 1 tablet or 5 mg twice daily.   atorvastatin 40 MG tablet Commonly known as: LIPITOR Take 1 tablet (40 mg total) by mouth daily at 6 PM.   blood glucose meter kit and supplies Dispense based on patient and insurance preference. Check blood sugar 4 times daily, three times before meals and once before bedtime. (FOR ICD-10 E10.9, E11.9).   carvedilol 6.25 MG tablet Commonly known as: COREG Take 1 tablet (6.25 mg total) by mouth 2 (two) times daily with a meal.   cyclobenzaprine 10 MG tablet Commonly known as: FLEXERIL Take 10 mg by mouth 3 (three) times daily as needed for muscle spasms.   digoxin 0.125 MG tablet Commonly known as: LANOXIN Take 1 tablet (0.125 mg total) by mouth daily.   empagliflozin 10 MG Tabs tablet Commonly known as: JARDIANCE Take 10 mg by mouth daily.   furosemide 20 MG tablet Commonly known as: LASIX Take 1 tablet (20 mg total) by mouth daily. Start taking on: Dec 22, 2019 What changed: Another medication with the same name was removed. Continue taking this medication, and follow the directions you see here.   gabapentin 300 MG capsule Commonly known as: NEURONTIN Take 1 capsule (300 mg total) by mouth 3 (three) times daily.   insulin glargine 100 UNIT/ML Solostar Pen Commonly known as: LANTUS Inject 40 Units into the skin daily.   metFORMIN 500 MG tablet Commonly known as: GLUCOPHAGE Take 1 tablet (500 mg total) by mouth 2 (two) times daily with a meal.   NovoLOG FlexPen 100 UNIT/ML FlexPen Generic drug: insulin aspart Inject 10 Units into the skin 3 (three) times daily with meals.   PARoxetine  10 MG tablet Commonly known as: PAXIL Take 1 tablet (10 mg total) by mouth daily.   rizatriptan 10 MG tablet Commonly known as: MAXALT Take 1 tablet (10 mg total) by mouth as needed for migraine. May repeat in 2 hours if needed   sacubitril-valsartan 24-26 MG Commonly known as: ENTRESTO Take 1 tablet by mouth 2 (two) times daily.   spironolactone 25 MG tablet Commonly known as: ALDACTONE Take 1 tablet (25 mg total) by mouth daily.      Follow-up Information    Lorelee Market, MD. Schedule an appointment as soon as possible for a visit.   Specialty: Family Medicine Contact information: [place] [address] [phone]        Bensimhon, Shaune Pascal, MD .   Specialty: Cardiology Contact information: [place] [address] [phone]          Allergies  Allergen Reactions  . Ibuprofen Anaphylaxis  Consultations:  Cardiology  Procedures/Studies: DG Chest 1 View  Result Date: 12/17/2019 CLINICAL DATA:  Apnea EXAM: CHEST  1 VIEW COMPARISON:  12/16/2019 FINDINGS: Cardiomegaly. Probable mild bibasilar atelectasis. The visualized skeletal structures are unremarkable. IMPRESSION: Cardiomegaly.  Probable mild bibasilar atelectasis. Electronically Signed   By: Eddie Candle M.D.   On: 12/17/2019 20:27   DG Chest 2 View  Result Date: 12/16/2019 CLINICAL DATA:  Chest pain, shortness of breath, RIGHT-side rib and chest pain with inspiration, accidentally kicked in RIGHT side while sleeping, history diabetes mellitus, hypertension EXAM: CHEST - 2 VIEW COMPARISON:  11/07/2019 FINDINGS: Enlargement of cardiac silhouette. Mediastinal contours and pulmonary vascularity normal. Lungs clear. No pulmonary infiltrate, pleural effusion or pneumothorax. Osseous structures unremarkable. IMPRESSION: No acute abnormalities. Electronically Signed   By: Lavonia Dana M.D.   On: 12/16/2019 12:54   CT Angio Chest PE W and/or Wo Contrast  Result  Date: 12/16/2019 CLINICAL DATA:  Shortness of breath EXAM: CT ANGIOGRAPHY CHEST WITH CONTRAST TECHNIQUE: Multidetector CT imaging of the chest was performed using the standard protocol during bolus administration of intravenous contrast. Multiplanar CT image reconstructions and MIPs were obtained to evaluate the vascular anatomy. CONTRAST:  21m OMNIPAQUE IOHEXOL 350 MG/ML SOLN COMPARISON:  Chest radiograph December 16, 2019 and CT angiogram chest September 06, 2019 FINDINGS: Cardiovascular: There are small peripheral subsegmental pulmonary emboli in the lateral and posterior segments of the right lower lobe. No more central pulmonary emboli evident. No evident right heart strain. No thoracic aortic aneurysm or dissection. Visualized great vessels appear unremarkable. Note that the right innominate and left common carotid arteries arise as a common trunk, an anatomic variant. The main pulmonary outflow tract measures 3.2 cm in diameter, prominent. Mediastinum/Nodes: Thyroid appears unremarkable. No evident thoracic adenopathy. There is a small hiatal hernia. Lungs/Pleura: There is mild patchy airspace opacity in each lower lobe. A somewhat wedge-shaped area of airspace opacity is noted in the posterior aspect of the superior segment of the right lower lobe. Lungs elsewhere are clear. No pleural effusions. Upper Abdomen: Visualized upper abdominal structures appear unremarkable. Musculoskeletal: No blastic or lytic bone lesions. No evident chest wall lesions. Review of the MIP images confirms the above findings. IMPRESSION: 1. Small subsegmental level pulmonary emboli in the posterior and lateral segments of the right lower lobe. No more central pulmonary emboli evident on either side. 2. Significantly less infiltrate compared to prior CT. There is relatively mild airspace opacity in each lower lobe, probably due to residua from atypical organism pneumonia. Somewhat wedge-shaped area of increased opacity noted abutting  the pleura in the superior segment right lower lobe. Suspect pneumonia in this area. Infarct could present in this manner, although there is currently no pulmonary embolus supplying this area. Potential infarct from residual pulmonary embolus which has recanalized is conceivable. 3. Prominence of the main pulmonary outflow tract, a finding indicative of a degree of pulmonary arterial hypertension. 4.  No evident adenopathy. 5.  Small hiatal hernia. Critical Value/emergent results were called by telephone at the time of interpretation on 12/16/2019 at 3:04 pm to provider RLavonia Drafts, who verbally acknowledged these results. Electronically Signed   By: WLowella GripIII M.D.   On: 12/16/2019 15:06   ECHOCARDIOGRAM COMPLETE  Result Date: 12/21/2019    ECHOCARDIOGRAM REPORT   Patient Name:   Cheryl HARDTDate of Exam: 12/20/2019 Medical Rec #:  [id number]             Height:  61.0 in Accession #:    [id number]             Weight:       151.2 lb Date of Birth:  29-Dec-1985             BSA:          1.677 m Patient Age:    33 years               BP:           131/71 mmHg Patient Gender: F                      HR:           98 bpm. Exam Location:  [facility] Procedure: 3D Echo, Cardiac Doppler and Color Doppler Indications:     I26.99 Pulmonary embolus  History:         Patient has prior history of Echocardiogram examinations, most                  recent 11/07/2019. Risk Factors:Diabetes. Systolic heart                  failure.  Sonographer:     Wilford Sports Rodgers-Jones Referring Phys:  [id number] [place] Diagnosing Phys: Nelva Bush MD IMPRESSIONS  1. Left ventricular ejection fraction, by estimation, is 25 to 30%. The left ventricle has severely decreased function. The left ventricle demonstrates global hypokinesis. The left ventricular internal cavity size was mildly dilated. Left ventricular diastolic parameters are consistent with Grade II diastolic dysfunction (pseudonormalization).  Elevated left atrial pressure.  2. Right ventricular systolic function is mildly reduced. The right ventricular size is normal. There is normal pulmonary artery systolic pressure.  3. The mitral valve is abnormal. Mild mitral valve regurgitation. No evidence of mitral stenosis.  4. The aortic valve is tricuspid. Aortic valve regurgitation is not visualized. No aortic stenosis is present.  5. The inferior vena cava is normal in size with <50% respiratory variability, suggesting right atrial pressure of 8 mmHg. FINDINGS  Left Ventricle: Left ventricular ejection fraction, by estimation, is 25 to 30%. The left ventricle has severely decreased function. The left ventricle demonstrates global hypokinesis. The left ventricular internal cavity size was mildly dilated. There is no left ventricular hypertrophy. Left ventricular diastolic parameters are consistent with Grade II diastolic dysfunction (pseudonormalization). Elevated left atrial pressure. Right Ventricle: The right ventricular size is normal. No increase in right ventricular wall thickness. Right ventricular systolic function is mildly reduced. There is normal pulmonary artery systolic pressure. The tricuspid regurgitant velocity is 2.50 m/s, and with an assumed right atrial pressure of 8 mmHg, the estimated right ventricular systolic pressure is 42.6 mmHg. Left Atrium: Left atrial size was normal in size. Right Atrium: Right atrial size was normal in size. Pericardium: There is no evidence of pericardial effusion. Mitral Valve: The mitral valve is abnormal. Mild mitral valve regurgitation. No evidence of mitral valve stenosis. Tricuspid Valve: The tricuspid valve is normal in structure. Tricuspid valve regurgitation is mild. Aortic Valve: The aortic valve is tricuspid. Aortic valve regurgitation is not visualized. No aortic stenosis is present. Pulmonic Valve: The pulmonic valve was normal in structure. Pulmonic valve regurgitation is trivial. No evidence of  pulmonic stenosis. Aorta: The aortic root and ascending aorta are structurally normal, with no evidence of dilitation. Pulmonary Artery: The pulmonary artery is of normal size. Venous: The inferior vena cava is normal in size with less  than 50% respiratory variability, suggesting right atrial pressure of 8 mmHg. IAS/Shunts: No atrial level shunt detected by color flow Doppler.  LEFT VENTRICLE PLAX 2D LVIDd:         5.27 cm  Diastology LVIDs:         4.61 cm  LV e' lateral:   10.10 cm/s LV PW:         0.93 cm  LV E/e' lateral: 11.9 LV IVS:        0.69 cm  LV e' medial:    6.20 cm/s LVOT diam:     2.00 cm  LV E/e' medial:  19.4 LV SV:         29 LV SV Index:   17 LVOT Area:     3.14 cm  RIGHT VENTRICLE RV Basal diam:  3.49 cm RV S prime:     12.00 cm/s TAPSE (M-mode): 1.7 cm LEFT ATRIUM             Index       RIGHT ATRIUM           Index LA diam:        4.30 cm 2.56 cm/m  RA Area:     15.30 cm LA Vol (A2C):   57.6 ml 34.35 ml/m RA Volume:   43.20 ml  25.76 ml/m LA Vol (A4C):   45.3 ml 27.01 ml/m LA Biplane Vol: 51.2 ml 30.53 ml/m  AORTIC VALVE LVOT Vmax:   64.60 cm/s LVOT Vmean:  46.400 cm/s LVOT VTI:    0.092 m  AORTA Ao Root diam: 2.60 cm Ao Asc diam:  2.60 cm MITRAL VALVE                TRICUSPID VALVE MV Area (PHT): 4.89 cm     TR Peak grad:   25.0 mmHg MV Decel Time: 155 msec     TR Vmax:        250.00 cm/s MV E velocity: 120.00 cm/s MV A velocity: 72.20 cm/s   SHUNTS MV E/A ratio:  1.66         Systemic VTI:  0.09 m                             Systemic Diam: 2.00 cm Nelva Bush MD Electronically signed by Nelva Bush MD Signature Date/Time: 12/21/2019/6:51:59 AM    Final      Subjective: Was feeling better when seen today.  She wants to go back home.  She states that now she will follow-up with her physicians as she has Medicaid now.  Discharge Exam: Vitals:   12/21/19 0744 12/21/19 1153  BP: 128/87 (!) 130/91  Pulse: 94 93  Resp: 18 19  Temp: 98.4 F (36.9 C) 97.8 F (36.6 C)   SpO2: 99% 99%   Vitals:   12/21/19 0337 12/21/19 0500 12/21/19 0744 12/21/19 1153  BP: (!) 132/97  128/87 (!) 130/91  Pulse: (!) 106  94 93  Resp: 20  18 19   Temp: 99.2 F (37.3 C)  98.4 F (36.9 C) 97.8 F (36.6 C)  TempSrc:   Oral   SpO2: 99%  99% 99%  Weight:  67.5 kg    Height:        General: Pt is alert, awake, not in acute distress Cardiovascular: RRR, S1/S2 +, no rubs, no gallops Respiratory: CTA bilaterally, no wheezing, no rhonchi Abdominal: Soft, NT, ND, bowel sounds + Extremities: no edema, no  cyanosis   The results of significant diagnostics from this hospitalization (including imaging, microbiology, ancillary and laboratory) are listed below for reference.    Microbiology: Recent Results (from the past 240 hour(s))  Urine culture     Status: Abnormal   Collection Time: 12/16/19 12:28 PM   Specimen: Urine, Random  Result Value Ref Range Status   Specimen Description   Final    URINE, RANDOM Performed at [facility], [address] [place], [place] [address]    Special Requests   Final    NONE Performed at [facility], [address] [place], [place] [address]    Culture >=100,000 COLONIES/mL ESCHERICHIA COLI (A)  Final   Report Status 12/18/2019 FINAL  Final   Organism ID, Bacteria ESCHERICHIA COLI (A)  Final      Susceptibility   Escherichia coli - MIC*    AMPICILLIN <=2 SENSITIVE Sensitive     CEFAZOLIN <=4 SENSITIVE Sensitive     CEFTRIAXONE <=1 SENSITIVE Sensitive     CIPROFLOXACIN >=4 RESISTANT Resistant     GENTAMICIN <=1 SENSITIVE Sensitive     IMIPENEM <=0.25 SENSITIVE Sensitive     NITROFURANTOIN <=16 SENSITIVE Sensitive     TRIMETH/SULFA <=20 SENSITIVE Sensitive     AMPICILLIN/SULBACTAM <=2 SENSITIVE Sensitive     PIP/TAZO <=4 SENSITIVE Sensitive     * >=100,000 COLONIES/mL ESCHERICHIA COLI  Blood Culture (routine x 2)     Status: None   Collection Time: 12/16/19  1:26 PM   Specimen: BLOOD  Result Value Ref  Range Status   Specimen Description BLOOD LEFT ANTECUBITAL  Final   Special Requests   Final    BOTTLES DRAWN AEROBIC AND ANAEROBIC Blood Culture adequate volume   Culture   Final    NO GROWTH 5 DAYS Performed at [facility], [address] [place], [place] [address]    Report Status 12/21/2019 FINAL  Final  Respiratory Panel by RT PCR (Flu A&B, Covid) - Nasopharyngeal Swab     Status: None   Collection Time: 12/16/19  4:49 PM   Specimen: Nasopharyngeal Swab  Result Value Ref Range Status   SARS Coronavirus 2 by RT PCR NEGATIVE NEGATIVE Final    Comment: (NOTE) SARS-CoV-2 target nucleic acids are NOT DETECTED. The SARS-CoV-2 RNA is generally detectable in upper respiratoy specimens during the acute phase of infection. The lowest concentration of SARS-CoV-2 viral copies this assay can detect is 131 copies/mL. A negative result does not preclude SARS-Cov-2 infection and should not be used as the sole basis for treatment or other patient management decisions. A negative result may occur with  improper specimen collection/handling, submission of specimen other than nasopharyngeal swab, presence of viral mutation(s) within the areas targeted by this assay, and inadequate number of viral copies (<131 copies/mL). A negative result must be combined with clinical observations, patient history, and epidemiological information. The expected result is Negative. Fact Sheet for Patients:  PinkCheek.be Fact Sheet for Healthcare Providers:  GravelBags.it This test is not yet ap proved or cleared by the [place] FDA and  has been authorized for detection and/or diagnosis of SARS-CoV-2 by FDA under an Emergency Use Authorization (EUA). This EUA will remain  in effect (meaning this test can be used) for the duration of the COVID-19 declaration under Section 564(b)(1) of the Act, 21 U.S.C. section 360bbb-3(b)(1), unless the  authorization is terminated or revoked sooner.    Influenza A by PCR NEGATIVE NEGATIVE Final   Influenza B by PCR  NEGATIVE NEGATIVE Final    Comment: (NOTE) The Xpert Xpress SARS-CoV-2/FLU/RSV assay is intended as an aid in  the diagnosis of influenza from Nasopharyngeal swab specimens and  should not be used as a sole basis for treatment. Nasal washings and  aspirates are unacceptable for Xpert Xpress SARS-CoV-2/FLU/RSV  testing. Fact Sheet for Patients: PinkCheek.be Fact Sheet for Healthcare Providers: GravelBags.it This test is not yet approved or cleared by the [place] FDA and  has been authorized for detection and/or diagnosis of SARS-CoV-2 by  FDA under an Emergency Use Authorization (EUA). This EUA will remain  in effect (meaning this test can be used) for the duration of the  Covid-19 declaration under Section 564(b)(1) of the Act, 21  U.S.C. section 360bbb-3(b)(1), unless the authorization is  terminated or revoked. Performed at [facility], [address] [place], [place] [address]      Labs: BNP (last 3 results) Recent Labs    11/07/19 0307 12/16/19 1217 12/18/19 0440  BNP 965.0* 938.0* 540.0*   Basic Metabolic Panel: Recent Labs  Lab 12/16/19 2213 12/17/19 0653 12/17/19 2125 12/18/19 0440 12/19/19 0702 12/20/19 0606 12/21/19 0555  NA 129*   < > 128* 131* 136 133* 133*  K 3.2*   < > 3.5 3.3* 3.8 4.3 4.3  CL 92*   < > 97* 99 105 103 100  CO2 24   < > 22 25 24 25 25   GLUCOSE 254*   < > 269* 227* 319* 188* 219*  BUN 7   < > 18 18 10 7 6   CREATININE 0.73   < > 0.58 0.56 0.50 0.46 0.43*  CALCIUM 8.1*   < > 7.3* 7.6* 7.8* 8.5* 8.5*  MG 1.5*  --   --   --   --   --   --    < > = values in this interval not displayed.   Liver Function Tests: Recent Labs  Lab 12/17/19 2125  AST 48*  ALT 13  ALKPHOS 104  BILITOT 0.8  PROT 5.8*  ALBUMIN 2.1*   No results for input(s): LIPASE,  AMYLASE in the last 168 hours. Recent Labs  Lab 12/17/19 2125  AMMONIA 34   CBC: Recent Labs  Lab 12/17/19 2125 12/18/19 0440 12/19/19 0702 12/20/19 0606 12/21/19 0555  WBC 18.6* 16.1* 10.5 10.2 11.8*  NEUTROABS 16.0*  --   --   --   --   HGB 13.3 13.8 13.0 12.1 12.7  HCT 38.9 39.6 38.3 36.8 37.4  MCV 86.4 85.2 87.8 89.3 88.0  PLT 352 324 342 380 458*   Cardiac Enzymes: Recent Labs  Lab 12/16/19 2213  CKTOTAL 116   BNP: Invalid input(s): POCBNP CBG: Recent Labs  Lab 12/20/19 1138 12/20/19 1646 12/20/19 2058 12/21/19 0744 12/21/19 1152  GLUCAP 231* 165* 132* 177* 245*   D-Dimer No results for input(s): DDIMER in the last 72 hours. Hgb A1c No results for input(s): HGBA1C in the last 72 hours. Lipid Profile No results for input(s): CHOL, HDL, LDLCALC, TRIG, CHOLHDL, LDLDIRECT in the last 72 hours. Thyroid function studies No results for input(s): TSH, T4TOTAL, T3FREE, THYROIDAB in the last 72 hours.  Invalid input(s): FREET3 Anemia work up No results for input(s): VITAMINB12, FOLATE, FERRITIN, TIBC, IRON, RETICCTPCT in the last 72 hours. Urinalysis    Component Value Date/Time   COLORURINE YELLOW (A) 12/16/2019 1228   APPEARANCEUR HAZY (A) 12/16/2019 1228   LABSPEC 1.032 (H) 12/16/2019 1228   PHURINE 6.0 12/16/2019 1228  GLUCOSEU >=500 (A) 12/16/2019 1228   HGBUR MODERATE (A) 12/16/2019 1228   BILIRUBINUR NEGATIVE 12/16/2019 1228   KETONESUR 5 (A) 12/16/2019 1228   PROTEINUR >=300 (A) 12/16/2019 1228   UROBILINOGEN 0.2 02/04/2019 1720   NITRITE POSITIVE (A) 12/16/2019 1228   LEUKOCYTESUR NEGATIVE 12/16/2019 1228   Sepsis Labs Invalid input(s): PROCALCITONIN,  WBC,  LACTICIDVEN Microbiology Recent Results (from the past 240 hour(s))  Urine culture     Status: Abnormal   Collection Time: 12/16/19 12:28 PM   Specimen: Urine, Random  Result Value Ref Range Status   Specimen Description   Final    URINE, RANDOM Performed at [facility],  [address] [place], [place] [address]    Special Requests   Final    NONE Performed at [facility], [address] [place], [place] [address]    Culture >=100,000 COLONIES/mL ESCHERICHIA COLI (A)  Final   Report Status 12/18/2019 FINAL  Final   Organism ID, Bacteria ESCHERICHIA COLI (A)  Final      Susceptibility   Escherichia coli - MIC*    AMPICILLIN <=2 SENSITIVE Sensitive     CEFAZOLIN <=4 SENSITIVE Sensitive     CEFTRIAXONE <=1 SENSITIVE Sensitive     CIPROFLOXACIN >=4 RESISTANT Resistant     GENTAMICIN <=1 SENSITIVE Sensitive     IMIPENEM <=0.25 SENSITIVE Sensitive     NITROFURANTOIN <=16 SENSITIVE Sensitive     TRIMETH/SULFA <=20 SENSITIVE Sensitive     AMPICILLIN/SULBACTAM <=2 SENSITIVE Sensitive     PIP/TAZO <=4 SENSITIVE Sensitive     * >=100,000 COLONIES/mL ESCHERICHIA COLI  Blood Culture (routine x 2)     Status: None   Collection Time: 12/16/19  1:26 PM   Specimen: BLOOD  Result Value Ref Range Status   Specimen Description BLOOD LEFT ANTECUBITAL  Final   Special Requests   Final    BOTTLES DRAWN AEROBIC AND ANAEROBIC Blood Culture adequate volume   Culture   Final    NO GROWTH 5 DAYS Performed at [facility], [address] [place], [place] [address]    Report Status 12/21/2019 FINAL  Final  Respiratory Panel by RT PCR (Flu A&B, Covid) - Nasopharyngeal Swab     Status: None   Collection Time: 12/16/19  4:49 PM   Specimen: Nasopharyngeal Swab  Result Value Ref Range Status   SARS Coronavirus 2 by RT PCR NEGATIVE NEGATIVE Final    Comment: (NOTE) SARS-CoV-2 target nucleic acids are NOT DETECTED. The SARS-CoV-2 RNA is generally detectable in upper respiratoy specimens during the acute phase of infection. The lowest concentration of SARS-CoV-2 viral copies this assay can detect is 131 copies/mL. A negative result does not preclude SARS-Cov-2 infection and should not be used as the sole basis for treatment or other patient  management decisions. A negative result may occur with  improper specimen collection/handling, submission of specimen other than nasopharyngeal swab, presence of viral mutation(s) within the areas targeted by this assay, and inadequate number of viral copies (<131 copies/mL). A negative result must be combined with clinical observations, patient history, and epidemiological information. The expected result is Negative. Fact Sheet for Patients:  PinkCheek.be Fact Sheet for Healthcare Providers:  GravelBags.it This test is not yet ap proved or cleared by the [place] FDA and  has been authorized for detection and/or diagnosis of SARS-CoV-2 by FDA under an Emergency Use Authorization (EUA). This EUA will remain  in effect (meaning this test can be used) for the duration of  the COVID-19 declaration under Section 564(b)(1) of the Act, 21 U.S.C. section 360bbb-3(b)(1), unless the authorization is terminated or revoked sooner.    Influenza A by PCR NEGATIVE NEGATIVE Final   Influenza B by PCR NEGATIVE NEGATIVE Final    Comment: (NOTE) The Xpert Xpress SARS-CoV-2/FLU/RSV assay is intended as an aid in  the diagnosis of influenza from Nasopharyngeal swab specimens and  should not be used as a sole basis for treatment. Nasal washings and  aspirates are unacceptable for Xpert Xpress SARS-CoV-2/FLU/RSV  testing. Fact Sheet for Patients: PinkCheek.be Fact Sheet for Healthcare Providers: GravelBags.it This test is not yet approved or cleared by the [place] FDA and  has been authorized for detection and/or diagnosis of SARS-CoV-2 by  FDA under an Emergency Use Authorization (EUA). This EUA will remain  in effect (meaning this test can be used) for the duration of the  Covid-19 declaration under Section 564(b)(1) of the Act, 21  U.S.C. section 360bbb-3(b)(1), unless the  authorization is  terminated or revoked. Performed at [facility], [address] [place], [place] [address]     Time coordinating discharge: Over 30 minutes  SIGNED:  Lorella Nimrod, MD  [facility] 12/21/2019, 3:32 PM  If 7PM-7AM, please contact night-coverage [email or web address]  This record has been created using Systems analyst. Errors have been sought and corrected,but may not always be located. Such creation errors do not reflect on the standard of care.

## 2019-12-21 NOTE — TOC Progression Note (Signed)
Transition of Care [facility]) - Progression Note    Patient Details  Name: Cheryl Mercer MRN: [id number] Date of Birth: 1986/05/10  Transition of [facility] CM/SW Contact  Shawn Route, RN Phone Number: 12/21/2019, 3:55 PM  Clinical Narrative:      Heart Failure Screen completed.  Patient provided a blood pressure cuff and scale.  Education provided on keeping a log and when to call the dr.  No further TOC needs at this time, please re-consult for new needs.         Expected Discharge Plan and Services           Expected Discharge Date: 12/21/19                                     Social Determinants of Health (SDOH) Interventions    Readmission Risk Interventions No flowsheet data found.

## 2019-12-21 NOTE — Progress Notes (Signed)
Progress Note  Patient Name: Cheryl Mercer Date of Encounter: 12/21/2019  Primary Cardiologist: Dr. Gala Romney  Subjective   Patient denies cp or sob. Started on eliquis for PE protocol.  Inpatient Medications    Scheduled Meds: . apixaban  10 mg Oral BID   Followed by  . [START ON 12/25/2019] apixaban  5 mg Oral BID  . atorvastatin  40 mg Oral q1800  . carvedilol  6.25 mg Oral BID WC  . Chlorhexidine Gluconate Cloth  6 each Topical Daily  . digoxin  0.125 mg Oral Daily  . furosemide  20 mg Oral Daily  . gabapentin  300 mg Oral TID  . insulin aspart  0-15 Units Subcutaneous TID WC  . insulin aspart  0-5 Units Subcutaneous QHS  . insulin aspart  10 Units Subcutaneous TID WC  . insulin glargine  50 Units Subcutaneous Daily  .  morphine injection  4 mg Intravenous Once  . PARoxetine  10 mg Oral Daily  . sacubitril-valsartan  1 tablet Oral BID  . senna  1 tablet Oral QHS  . sodium chloride flush  3 mL Intravenous Q12H  . spironolactone  25 mg Oral Daily   Continuous Infusions: . sodium chloride Stopped (12/20/19 1555)  . cefTRIAXone (ROCEPHIN)  IV Stopped (12/20/19 1511)   PRN Meds: sodium chloride, acetaminophen, cyclobenzaprine, naLOXone (NARCAN)  injection, ondansetron (ZOFRAN) IV, sodium chloride flush   Vital Signs    Vitals:   12/21/19 0337 12/21/19 0500 12/21/19 0744 12/21/19 1153  BP: (!) 132/97  128/87 (!) 130/91  Pulse: (!) 106  94 93  Resp: 20  18 19   Temp: 99.2 F (37.3 C)  98.4 F (36.9 C) 97.8 F (36.6 C)  TempSrc:   Oral   SpO2: 99%  99% 99%  Weight:  67.5 kg    Height:        Intake/Output Summary (Last 24 hours) at 12/21/2019 1309 Last data filed at 12/21/2019 0941 Gross per 24 hour  Intake 550.55 ml  Output 1000 ml  Net -449.45 ml   Last 3 Weights 12/21/2019 12/20/2019 12/19/2019  Weight (lbs) 148 lb 14.4 oz 151 lb 3.2 oz 148 lb 14.4 oz  Weight (kg) 67.541 kg 68.584 kg 67.541 kg      Telemetry    Sinus rhythm - Personally  Reviewed  ECG    No new tracing obtained - Personally Reviewed  Physical Exam   GEN: No acute distress.   Neck: No JVD Cardiac: RRR, no murmurs, rubs, or gallops.  Respiratory: Clear to auscultation bilaterally. GI: Soft, nontender, non-distended  MS: No edema; No deformity. Neuro:  Nonfocal  Psych: Normal affect   Labs    High Sensitivity Troponin:   Recent Labs  Lab 12/16/19 1217 12/16/19 1405 12/17/19 2125 12/17/19 2312  TROPONINIHS 19* 19* 12 12      Chemistry Recent Labs  Lab 12/17/19 2125 12/18/19 0440 12/19/19 0702 12/20/19 0606 12/21/19 0555  NA 128*   < > 136 133* 133*  K 3.5   < > 3.8 4.3 4.3  CL 97*   < > 105 103 100  CO2 22   < > 24 25 25   GLUCOSE 269*   < > 319* 188* 219*  BUN 18   < > 10 7 6   CREATININE 0.58   < > 0.50 0.46 0.43*  CALCIUM 7.3*   < > 7.8* 8.5* 8.5*  PROT 5.8*  --   --   --   --  ALBUMIN 2.1*  --   --   --   --   AST 48*  --   --   --   --   ALT 13  --   --   --   --   ALKPHOS 104  --   --   --   --   BILITOT 0.8  --   --   --   --   GFRNONAA >60   < > >60 >60 >60  GFRAA >60   < > >60 >60 >60  ANIONGAP 9   < > 7 5 8    < > = values in this interval not displayed.     Hematology Recent Labs  Lab 12/19/19 0702 12/20/19 0606 12/21/19 0555  WBC 10.5 10.2 11.8*  RBC 4.36 4.12 4.25  HGB 13.0 12.1 12.7  HCT 38.3 36.8 37.4  MCV 87.8 89.3 88.0  MCH 29.8 29.4 29.9  MCHC 33.9 32.9 34.0  RDW 13.9 13.6 13.3  PLT 342 380 458*    BNP Recent Labs  Lab 12/16/19 1217 12/18/19 0440  BNP 938.0* 195.0*     DDimer No results for input(s): DDIMER in the last 168 hours.   Radiology    ECHOCARDIOGRAM COMPLETE  Result Date: 12/21/2019    ECHOCARDIOGRAM REPORT   Patient Name:   Cheryl Mercer Date of Exam: 12/20/2019 Medical Rec #:  02/19/2020              Height:       61.0 in Accession #:    [id number]             Weight:       151.2 lb Date of Birth:  08-26-1985             BSA:          1.677 m Patient Age:    33 years                BP:           131/71 mmHg Patient Gender: F                      HR:           98 bpm. Exam Location:  [facility] Procedure: 3D Echo, Cardiac Doppler and Color Doppler Indications:     I26.99 Pulmonary embolus  History:         Patient has prior history of Echocardiogram examinations, most                  recent 11/07/2019. Risk Factors:Diabetes. Systolic heart                  failure.  Sonographer:     11/09/2019 Rodgers-Jones Referring Phys:  Sedonia Small [id number] CHATTERJEE Diagnosing Phys: Raynaldo Opitz MD IMPRESSIONS  1. Left ventricular ejection fraction, by estimation, is 25 to 30%. The left ventricle has severely decreased function. The left ventricle demonstrates global hypokinesis. The left ventricular internal cavity size was mildly dilated. Left ventricular diastolic parameters are consistent with Grade II diastolic dysfunction (pseudonormalization). Elevated left atrial pressure.  2. Right ventricular systolic function is mildly reduced. The right ventricular size is normal. There is normal pulmonary artery systolic pressure.  3. The mitral valve is abnormal. Mild mitral valve regurgitation. No evidence of mitral stenosis.  4. The aortic valve is tricuspid. Aortic valve regurgitation is not visualized. No aortic stenosis is present.  5. The  inferior vena cava is normal in size with <50% respiratory variability, suggesting right atrial pressure of 8 mmHg. FINDINGS  Left Ventricle: Left ventricular ejection fraction, by estimation, is 25 to 30%. The left ventricle has severely decreased function. The left ventricle demonstrates global hypokinesis. The left ventricular internal cavity size was mildly dilated. There is no left ventricular hypertrophy. Left ventricular diastolic parameters are consistent with Grade II diastolic dysfunction (pseudonormalization). Elevated left atrial pressure. Right Ventricle: The right ventricular size is normal. No increase in right ventricular wall thickness.  Right ventricular systolic function is mildly reduced. There is normal pulmonary artery systolic pressure. The tricuspid regurgitant velocity is 2.50 m/s, and with an assumed right atrial pressure of 8 mmHg, the estimated right ventricular systolic pressure is 83.3 mmHg. Left Atrium: Left atrial size was normal in size. Right Atrium: Right atrial size was normal in size. Pericardium: There is no evidence of pericardial effusion. Mitral Valve: The mitral valve is abnormal. Mild mitral valve regurgitation. No evidence of mitral valve stenosis. Tricuspid Valve: The tricuspid valve is normal in structure. Tricuspid valve regurgitation is mild. Aortic Valve: The aortic valve is tricuspid. Aortic valve regurgitation is not visualized. No aortic stenosis is present. Pulmonic Valve: The pulmonic valve was normal in structure. Pulmonic valve regurgitation is trivial. No evidence of pulmonic stenosis. Aorta: The aortic root and ascending aorta are structurally normal, with no evidence of dilitation. Pulmonary Artery: The pulmonary artery is of normal size. Venous: The inferior vena cava is normal in size with less than 50% respiratory variability, suggesting right atrial pressure of 8 mmHg. IAS/Shunts: No atrial level shunt detected by color flow Doppler.  LEFT VENTRICLE PLAX 2D LVIDd:         5.27 cm  Diastology LVIDs:         4.61 cm  LV e' lateral:   10.10 cm/s LV PW:         0.93 cm  LV E/e' lateral: 11.9 LV IVS:        0.69 cm  LV e' medial:    6.20 cm/s LVOT diam:     2.00 cm  LV E/e' medial:  19.4 LV SV:         29 LV SV Index:   17 LVOT Area:     3.14 cm  RIGHT VENTRICLE RV Basal diam:  3.49 cm RV S prime:     12.00 cm/s TAPSE (M-mode): 1.7 cm LEFT ATRIUM             Index       RIGHT ATRIUM           Index LA diam:        4.30 cm 2.56 cm/m  RA Area:     15.30 cm LA Vol (A2C):   57.6 ml 34.35 ml/m RA Volume:   43.20 ml  25.76 ml/m LA Vol (A4C):   45.3 ml 27.01 ml/m LA Biplane Vol: 51.2 ml 30.53 ml/m  AORTIC  VALVE LVOT Vmax:   64.60 cm/s LVOT Vmean:  46.400 cm/s LVOT VTI:    0.092 m  AORTA Ao Root diam: 2.60 cm Ao Asc diam:  2.60 cm MITRAL VALVE                TRICUSPID VALVE MV Area (PHT): 4.89 cm     TR Peak grad:   25.0 mmHg MV Decel Time: 155 msec     TR Vmax:        250.00 cm/s MV E velocity:  120.00 cm/s MV A velocity: 72.20 cm/s   SHUNTS MV E/A ratio:  1.66         Systemic VTI:  0.09 m                             Systemic Diam: 2.00 cm Yvonne Kendall MD Electronically signed by Yvonne Kendall MD Signature Date/Time: 12/21/2019/6:51:59 AM    Final     Cardiac Studies   2D echo 11/07/2019: 1. Left ventricular ejection fraction, by estimation, is 20 to 25%. The  left ventricle has severely decreased function. The left ventricle  demonstrates global hypokinesis. The left ventricular internal cavity size  was mildly dilated. There is mild left ventricular hypertrophy. Indeterminate diastolic filling due to E-A fusion.  2. Right ventricular systolic function is moderately reduced. The right  ventricular size is normal. There is mildly elevated pulmonary artery  systolic pressure. The estimated right ventricular systolic pressure is  35.5 mmHg.  3. Left atrial size was mildly dilated.  4. The mitral valve is normal in structure. Mild mitral valve  regurgitation. No evidence of mitral stenosis.  5. The aortic valve is tricuspid. Aortic valve regurgitation is not  visualized. No aortic stenosis is present.  6. The inferior vena cava is normal in size with <50% respiratory  variability, suggesting right atrial pressure of 8 mmHg  Patient Profile     34 y.o. female with NICM, UDS + for amphetamine presenting with chestpain and found to have PE.  Assessment & Plan    1. NICM EF 20-25% -cont gdmt with coreg, entresto, aldactone -restart pta lasix 20mg  qd (takes additional 40mg  if weight goes up) -strict in outs, daily weights  2. PE -on eliquis per PE protocol  3. HTN -BP  normal -cont chf meds as prescribed      Signed, , MD  12/21/2019, 1:09 PM

## 2019-12-22 LAB — LEGIONELLA PNEUMOPHILA SEROGP 1 UR AG: L. pneumophila Serogp 1 Ur Ag: NEGATIVE

## 2020-01-02 ENCOUNTER — Encounter (HOSPITAL_COMMUNITY): Payer: Medicaid Other

## 2020-06-29 DIAGNOSIS — Z20822 Contact with and (suspected) exposure to covid-19: Secondary | ICD-10-CM | POA: Diagnosis not present

## 2020-07-02 ENCOUNTER — Encounter (HOSPITAL_COMMUNITY): Payer: Self-pay

## 2020-07-02 ENCOUNTER — Other Ambulatory Visit: Payer: Self-pay

## 2020-07-02 ENCOUNTER — Emergency Department (HOSPITAL_COMMUNITY): Payer: Medicaid Other

## 2020-07-02 ENCOUNTER — Emergency Department (HOSPITAL_COMMUNITY)
Admission: EM | Admit: 2020-07-02 | Discharge: 2020-07-02 | Disposition: A | Discharge status: Home / Self Care | Payer: Medicaid Other | Attending: Emergency Medicine | Admitting: Emergency Medicine

## 2020-07-02 DIAGNOSIS — Z20822 Contact with and (suspected) exposure to covid-19: Secondary | ICD-10-CM | POA: Diagnosis not present

## 2020-07-02 DIAGNOSIS — I509 Heart failure, unspecified: Secondary | ICD-10-CM

## 2020-07-02 DIAGNOSIS — E119 Type 2 diabetes mellitus without complications: Secondary | ICD-10-CM | POA: Insufficient documentation

## 2020-07-02 DIAGNOSIS — J189 Pneumonia, unspecified organism: Secondary | ICD-10-CM | POA: Diagnosis not present

## 2020-07-02 DIAGNOSIS — I5043 Acute on chronic combined systolic (congestive) and diastolic (congestive) heart failure: Secondary | ICD-10-CM | POA: Insufficient documentation

## 2020-07-02 DIAGNOSIS — R0602 Shortness of breath: Secondary | ICD-10-CM | POA: Diagnosis not present

## 2020-07-02 DIAGNOSIS — Z7984 Long term (current) use of oral hypoglycemic drugs: Secondary | ICD-10-CM | POA: Insufficient documentation

## 2020-07-02 DIAGNOSIS — I428 Other cardiomyopathies: Secondary | ICD-10-CM

## 2020-07-02 DIAGNOSIS — I110 Hypertensive heart disease with heart failure: Secondary | ICD-10-CM | POA: Insufficient documentation

## 2020-07-02 DIAGNOSIS — E1165 Type 2 diabetes mellitus with hyperglycemia: Secondary | ICD-10-CM

## 2020-07-02 DIAGNOSIS — I5023 Acute on chronic systolic (congestive) heart failure: Secondary | ICD-10-CM | POA: Diagnosis not present

## 2020-07-02 DIAGNOSIS — I517 Cardiomegaly: Secondary | ICD-10-CM | POA: Diagnosis not present

## 2020-07-02 DIAGNOSIS — Z760 Encounter for issue of repeat prescription: Secondary | ICD-10-CM

## 2020-07-02 DIAGNOSIS — R609 Edema, unspecified: Secondary | ICD-10-CM

## 2020-07-02 DIAGNOSIS — Z7901 Long term (current) use of anticoagulants: Secondary | ICD-10-CM | POA: Insufficient documentation

## 2020-07-02 DIAGNOSIS — Z79899 Other long term (current) drug therapy: Secondary | ICD-10-CM | POA: Insufficient documentation

## 2020-07-02 DIAGNOSIS — Z794 Long term (current) use of insulin: Secondary | ICD-10-CM | POA: Diagnosis not present

## 2020-07-02 DIAGNOSIS — I10 Essential (primary) hypertension: Secondary | ICD-10-CM

## 2020-07-02 LAB — COMPREHENSIVE METABOLIC PANEL
ALT: 15 U/L (ref 0–44)
AST: 42 U/L — ABNORMAL HIGH (ref 15–41)
Albumin: 2.6 g/dL — ABNORMAL LOW (ref 3.5–5.0)
Alkaline Phosphatase: 159 U/L — ABNORMAL HIGH (ref 38–126)
Anion gap: 9 (ref 5–15)
BUN: 9 mg/dL (ref 6–20)
CO2: 25 mmol/L (ref 22–32)
Calcium: 8 mg/dL — ABNORMAL LOW (ref 8.9–10.3)
Chloride: 94 mmol/L — ABNORMAL LOW (ref 98–111)
Creatinine, Ser: 0.66 mg/dL (ref 0.44–1.00)
GFR, Estimated: >60 mL/min (ref >60)
Glucose, Bld: 581 mg/dL (ref 70–99)
Potassium: 3.4 mmol/L — ABNORMAL LOW (ref 3.5–5.1)
Sodium: 128 mmol/L — ABNORMAL LOW (ref 135–145)
Total Bilirubin: 0.8 mg/dL (ref 0.3–1.2)
Total Protein: 6.6 g/dL (ref 6.5–8.1)

## 2020-07-02 LAB — CBC WITH DIFFERENTIAL/PLATELET
Abs Immature Granulocytes: 0.03 10*3/uL (ref 0.00–0.07)
Basophils Absolute: 0.1 10*3/uL (ref 0.0–0.1)
Basophils Relative: 1 %
Eosinophils Absolute: 0.4 10*3/uL (ref 0.0–0.5)
Eosinophils Relative: 5 %
HCT: 38.8 % (ref 36.0–46.0)
Hemoglobin: 13 g/dL (ref 12.0–15.0)
Immature Granulocytes: 0 %
Lymphocytes Relative: 20 %
Lymphs Abs: 1.6 10*3/uL (ref 0.7–4.0)
MCH: 30.7 pg (ref 26.0–34.0)
MCHC: 33.5 g/dL (ref 30.0–36.0)
MCV: 91.7 fL (ref 80.0–100.0)
Monocytes Absolute: 0.3 10*3/uL (ref 0.1–1.0)
Monocytes Relative: 4 %
Neutro Abs: 5.5 10*3/uL (ref 1.7–7.7)
Neutrophils Relative %: 70 %
Platelets: 306 10*3/uL (ref 150–400)
RBC: 4.23 MIL/uL (ref 3.87–5.11)
RDW: 13.6 % (ref 11.5–15.5)
WBC: 7.9 10*3/uL (ref 4.0–10.5)
nRBC: 0 % (ref 0.0–0.2)

## 2020-07-02 LAB — RAPID URINE DRUG SCREEN, HOSP PERFORMED
Amphetamines: NOT DETECTED
Barbiturates: NOT DETECTED
Benzodiazepines: NOT DETECTED
Cocaine: NOT DETECTED
Opiates: NOT DETECTED
Tetrahydrocannabinol: NOT DETECTED

## 2020-07-02 LAB — URINALYSIS, ROUTINE W REFLEX MICROSCOPIC
Bilirubin Urine: NEGATIVE
Glucose, UA: >=500 mg/dL — AB
Ketones, ur: NEGATIVE mg/dL
Nitrite: NEGATIVE
Protein, ur: 100 mg/dL — AB
Specific Gravity, Urine: 1.027 (ref 1.005–1.030)
pH: 6 (ref 5.0–8.0)

## 2020-07-02 LAB — CBG MONITORING, ED
Glucose-Capillary: 482 mg/dL — ABNORMAL HIGH (ref 70–99)
Glucose-Capillary: 324 mg/dL — ABNORMAL HIGH (ref 70–99)
Glucose-Capillary: 418 mg/dL — ABNORMAL HIGH (ref 70–99)

## 2020-07-02 LAB — BRAIN NATRIURETIC PEPTIDE: B Natriuretic Peptide: 872 pg/mL — ABNORMAL HIGH (ref 0.0–100.0)

## 2020-07-02 LAB — PREGNANCY, URINE: Preg Test, Ur: NEGATIVE

## 2020-07-02 LAB — RESPIRATORY PANEL BY RT PCR (FLU A&B, COVID)
Influenza A by PCR: NEGATIVE
Influenza B by PCR: NEGATIVE
SARS Coronavirus 2 by RT PCR: NEGATIVE

## 2020-07-02 LAB — TROPONIN I (HIGH SENSITIVITY)
Troponin I (High Sensitivity): 13 ng/L (ref <18)
Troponin I (High Sensitivity): 15 ng/L (ref <18)

## 2020-07-02 LAB — DIGOXIN LEVEL: Digoxin Level: <0.2 ng/mL — ABNORMAL LOW (ref 1.0–2.0)

## 2020-07-02 MED ORDER — SPIRONOLACTONE 25 MG PO TABS
25.0000 mg | ORAL_TABLET | Freq: Every day | ORAL | 0 refills | Status: DC
Start: 2020-07-02 — End: 2020-07-11

## 2020-07-02 MED ORDER — NOVOLOG FLEXPEN 100 UNIT/ML ~~LOC~~ SOPN
10.0000 [IU] | PEN_INJECTOR | Freq: Three times a day (TID) | SUBCUTANEOUS | 0 refills | Status: DC
Start: 2020-07-02 — End: 2020-07-11

## 2020-07-02 MED ORDER — METFORMIN HCL 500 MG PO TABS: 500.0000 mg | ORAL_TABLET | Freq: Two times a day (BID) | ORAL | 0 refills | Status: DC

## 2020-07-02 MED ORDER — INSULIN GLARGINE 100 UNIT/ML SOLOSTAR PEN: 40.0000 [IU] | PEN_INJECTOR | Freq: Every day | SUBCUTANEOUS | 0 refills | Status: DC

## 2020-07-02 MED ORDER — LABETALOL HCL 5 MG/ML IV SOLN
10.0000 mg | Freq: Once | INTRAVENOUS | Status: AC
  Administered 2020-07-02: 10 mg via INTRAVENOUS
  Filled 2020-07-02: qty 4

## 2020-07-02 MED ORDER — EMPAGLIFLOZIN 10 MG PO TABS
10.0000 mg | ORAL_TABLET | Freq: Every day | ORAL | 0 refills | Status: DC
Start: 2020-07-02 — End: 2020-07-11

## 2020-07-02 MED ORDER — DIGOXIN 125 MCG PO TABS
0.1250 mg | ORAL_TABLET | Freq: Every day | ORAL | 0 refills | Status: DC
Start: 2020-07-02 — End: 2020-07-11

## 2020-07-02 MED ORDER — CARVEDILOL 6.25 MG PO TABS: 6.2500 mg | ORAL_TABLET | Freq: Two times a day (BID) | ORAL | 0 refills | Status: DC

## 2020-07-02 MED ORDER — FUROSEMIDE 20 MG PO TABS
20.0000 mg | ORAL_TABLET | Freq: Every day | ORAL | 0 refills | Status: DC
Start: 2020-07-02 — End: 2020-07-11

## 2020-07-02 MED ORDER — FUROSEMIDE 10 MG/ML IJ SOLN
40.0000 mg | Freq: Once | INTRAMUSCULAR | Status: AC
  Administered 2020-07-02: 40 mg via INTRAVENOUS
  Filled 2020-07-02: qty 4

## 2020-07-02 MED ORDER — IOHEXOL 350 MG/ML SOLN
100.0000 mL | Freq: Once | INTRAVENOUS | Status: AC | PRN
  Administered 2020-07-02: 100 mL via INTRAVENOUS

## 2020-07-02 MED ORDER — ATORVASTATIN CALCIUM 40 MG PO TABS
40.0000 mg | ORAL_TABLET | Freq: Every day | ORAL | 0 refills | Status: DC
Start: 2020-07-02 — End: 2021-06-15

## 2020-07-02 MED ORDER — GABAPENTIN 300 MG PO CAPS
300.0000 mg | ORAL_CAPSULE | Freq: Three times a day (TID) | ORAL | 0 refills | Status: DC
Start: 2020-07-02 — End: 2022-01-02

## 2020-07-02 MED ORDER — INSULIN ASPART 100 UNIT/ML ~~LOC~~ SOLN
10.0000 [IU] | Freq: Once | SUBCUTANEOUS | Status: AC
  Administered 2020-07-02: 10 [IU] via INTRAVENOUS
  Filled 2020-07-02: qty 1

## 2020-07-02 MED ORDER — BLOOD GLUCOSE METER KIT
PACK | 0 refills | Status: DC
Start: 2020-07-02 — End: 2021-08-30

## 2020-07-02 MED ORDER — PAROXETINE HCL 10 MG PO TABS
10.0000 mg | ORAL_TABLET | Freq: Every day | ORAL | 0 refills | Status: DC
Start: 2020-07-02 — End: 2020-07-11

## 2020-07-02 MED ORDER — APIXABAN 5 MG PO TABS
5.0000 mg | ORAL_TABLET | Freq: Two times a day (BID) | ORAL | 0 refills | Status: DC
Start: 2020-07-02 — End: 2020-07-11

## 2020-07-02 MED ORDER — SACUBITRIL-VALSARTAN 24-26 MG PO TABS: 1.0000 | ORAL_TABLET | Freq: Two times a day (BID) | ORAL | 0 refills | Status: DC

## 2020-07-02 NOTE — ED Triage Notes (Signed)
Pt reports history of chf.  Reports feet and lower legs swelling for the past 4 or 5 days.  Reports cough and sob.  Reports had negative covid test 2 days ago.  Denies fever.

## 2020-07-02 NOTE — ED Provider Notes (Addendum)
[facility] EMERGENCY DEPARTMENT Provider Note   CSN: [id number] Arrival date & time: 07/02/20  5784     History Chief Complaint  Patient presents with  . feet swelling    Cheryl Mercer is a 34 y.o. female.  Pt presents to the ED today with sob and cough.  Pt has an unfortunate history of type 2 diabetes, HTN, and nonischemic cardiomyopathy with an EF of 25-30% (last ECHO on 12/20/19) and PE. CHF thought to be due to uncontrolled htn and dm.  Cath in August of 2020 showed no significant CAD.  She was last admitted from 4/30-12/21/19.  She was supposed to f/u in the heart failure clinic which she has not done.  She has not followed up with anyone since d/c and is out of all of her medications.         Past Medical History:  Diagnosis Date  . CHF (congestive heart failure) ([place])   . Chronic back pain   . Diabetes mellitus without complication ([place])   . Migraines   . No pertinent past medical history   . Pregnancy induced hypertension     Patient Active Problem List   Diagnosis Date Noted  . Apnea   . Pulmonary infarct ([place]) 12/17/2019  . Pulmonary embolus ([place]) 12/16/2019  . Community acquired pneumonia 12/16/2019  . Dilated cardiomyopathy ([place]) 11/10/2019  . History of COVID-19 11/07/2019  . Acute on chronic combined systolic (congestive) and diastolic (congestive) heart failure ([place]) 11/07/2019  . Elevated troponin 11/07/2019  . Viral pneumonia   . Tachycardia   . Multifocal pneumonia 09/07/2019  . Chronic combined systolic and diastolic congestive heart failure ([place]) 09/07/2019  . Dyslipidemia 09/07/2019  . Back pain 09/07/2019  . Atypical pneumonia 09/07/2019  . Essential hypertension 03/24/2019  . Uncontrolled type 2 diabetes mellitus with hyperglycemia ([place]) 03/23/2019  . Peripheral edema 03/23/2019  . Cardiomegaly 03/23/2019  . Acute on chronic combined systolic and diastolic CHF (congestive heart failure) ([place]) 03/23/2019  . Proteinuria 03/23/2019   . Acute bacterial tonsillitis 05/18/2013    Class: Acute  . Lower urinary tract infectious disease 07/30/2012    Past Surgical History:  Procedure Laterality Date  . CERVICAL BIOPSY  W/ LOOP ELECTRODE EXCISION  2009  . Laparoscopic tubal sterilization with Falope rings.  2009  . RIGHT/LEFT HEART CATH AND CORONARY ANGIOGRAPHY N/A 03/25/2019   Procedure: RIGHT/LEFT HEART CATH AND CORONARY ANGIOGRAPHY;  Surgeon: Jolaine Artist, MD;  Location: [facility];  Service: Cardiovascular;  Laterality: N/A;  . TUBAL LIGATION       OB History    Gravida  4   Para  4   Term  4   Preterm      AB      Living  4     SAB      TAB      Ectopic      Multiple      Live Births  4           Family History  Problem Relation Age of Onset  . Hypertension Mother   . Anesthesia problems Neg Hx     Social History   Tobacco Use  . Smoking status: Never Smoker  . Smokeless tobacco: Never Used  Vaping Use  . Vaping Use: Never used  Substance Use Topics  . Alcohol use: No    Alcohol/week: 6.0 standard drinks    Types: 6 Shots of liquor per week  . Drug use: No  Types: Marijuana    Home Medications Prior to Admission medications   Medication Sig Start Date End Date Taking? Authorizing Provider  apixaban (ELIQUIS) 5 MG TABS tablet Take 1 tablet (5 mg total) by mouth 2 (two) times daily. 07/02/20   Isla Pence, MD  atorvastatin (LIPITOR) 40 MG tablet Take 1 tablet (40 mg total) by mouth daily at 6 PM. 07/02/20   Isla Pence, MD  blood glucose meter kit and supplies Dispense based on patient and insurance preference. Check blood sugar 4 times daily, three times before meals and once before bedtime. (FOR ICD-10 E10.9, E11.9). 07/02/20   Isla Pence, MD  carvedilol (COREG) 6.25 MG tablet Take 1 tablet (6.25 mg total) by mouth 2 (two) times daily with a meal. 07/02/20   Isla Pence, MD  cyclobenzaprine (FLEXERIL) 10 MG tablet Take 10 mg by mouth 3 (three)  times daily as needed for muscle spasms. 04/07/19   [provider]  digoxin (LANOXIN) 0.125 MG tablet Take 1 tablet (0.125 mg total) by mouth daily. 07/02/20   Isla Pence, MD  empagliflozin (JARDIANCE) 10 MG TABS tablet Take 1 tablet (10 mg total) by mouth daily. 07/02/20   Isla Pence, MD  furosemide (LASIX) 20 MG tablet Take 1 tablet (20 mg total) by mouth daily. 07/02/20   Isla Pence, MD  gabapentin (NEURONTIN) 300 MG capsule Take 1 capsule (300 mg total) by mouth 3 (three) times daily. 07/02/20   Isla Pence, MD  insulin aspart (NOVOLOG FLEXPEN) 100 UNIT/ML FlexPen Inject 10 Units into the skin 3 (three) times daily with meals. 07/02/20   Isla Pence, MD  insulin glargine (LANTUS) 100 UNIT/ML Solostar Pen Inject 40 Units into the skin daily. 07/02/20   Isla Pence, MD  metFORMIN (GLUCOPHAGE) 500 MG tablet Take 1 tablet (500 mg total) by mouth 2 (two) times daily with a meal. 07/02/20   Isla Pence, MD  PARoxetine (PAXIL) 10 MG tablet Take 1 tablet (10 mg total) by mouth daily. 07/02/20   Isla Pence, MD  rizatriptan (MAXALT) 10 MG tablet Take 1 tablet (10 mg total) by mouth as needed for migraine. May repeat in 2 hours if needed 09/08/19   Max Sane, MD  sacubitril-valsartan (ENTRESTO) 24-26 MG Take 1 tablet by mouth 2 (two) times daily. 07/02/20   Isla Pence, MD  spironolactone (ALDACTONE) 25 MG tablet Take 1 tablet (25 mg total) by mouth daily. 07/02/20   Isla Pence, MD    Allergies    Ibuprofen  Review of Systems   Review of Systems  Respiratory: Positive for shortness of breath.   Cardiovascular: Positive for leg swelling.  All other systems reviewed and are negative.   Physical Exam Updated Vital Signs BP (!) 141/107   Pulse 96   Temp 98.1 F (36.7 C) (Oral)   Resp 19   Ht 5' 2" (1.575 m)   Wt 64.4 kg   LMP 06/30/2020   SpO2 98%   BMI 25.97 kg/m   Physical Exam Vitals and nursing note reviewed.  Constitutional:       Appearance: She is ill-appearing.  HENT:     Head: Normocephalic and atraumatic.     Right Ear: External ear normal.     Left Ear: External ear normal.     Nose: Nose normal.     Mouth/Throat:     Mouth: Mucous membranes are dry.  Eyes:     Extraocular Movements: Extraocular movements intact.     Conjunctiva/sclera: Conjunctivae normal.  Pupils: Pupils are equal, round, and reactive to light.  Cardiovascular:     Rate and Rhythm: Regular rhythm. Tachycardia present.     Pulses: Normal pulses.     Heart sounds: Normal heart sounds.  Pulmonary:     Breath sounds: Rhonchi present.  Abdominal:     General: Abdomen is flat. Bowel sounds are normal.     Palpations: Abdomen is soft.  Musculoskeletal:        General: Normal range of motion.     Cervical back: Normal range of motion and neck supple.     Right lower leg: Edema present.     Left lower leg: Edema present.  Skin:    General: Skin is warm.     Capillary Refill: Capillary refill takes less than 2 seconds.  Neurological:     General: No focal deficit present.     Mental Status: She is alert and oriented to person, place, and time.  Psychiatric:        Mood and Affect: Mood normal.        Behavior: Behavior normal.     ED Results / Procedures / Treatments   Labs (all labs ordered are listed, but only abnormal results are displayed) Labs Reviewed  BRAIN NATRIURETIC PEPTIDE - Abnormal; Notable for the following components:      Result Value   B Natriuretic Peptide 872.0 (*)    All other components within normal limits  COMPREHENSIVE METABOLIC PANEL - Abnormal; Notable for the following components:   Sodium 128 (*)    Potassium 3.4 (*)    Chloride 94 (*)    Glucose, Bld 581 (*)    Calcium 8.0 (*)    Albumin 2.6 (*)    AST 42 (*)    Alkaline Phosphatase 159 (*)    All other components within normal limits  URINALYSIS, ROUTINE W REFLEX MICROSCOPIC - Abnormal; Notable for the following components:   APPearance  HAZY (*)    Glucose, UA >=500 (*)    Hgb urine dipstick MODERATE (*)    Protein, ur 100 (*)    Leukocytes,Ua SMALL (*)    Bacteria, UA RARE (*)    All other components within normal limits  DIGOXIN LEVEL - Abnormal; Notable for the following components:   Digoxin Level <0.2 (*)    All other components within normal limits  CBG MONITORING, ED - Abnormal; Notable for the following components:   Glucose-Capillary 482 (*)    All other components within normal limits  CBG MONITORING, ED - Abnormal; Notable for the following components:   Glucose-Capillary 324 (*)    All other components within normal limits  CBG MONITORING, ED - Abnormal; Notable for the following components:   Glucose-Capillary 418 (*)    All other components within normal limits  RESPIRATORY PANEL BY RT PCR (FLU A&B, COVID)  CBC WITH DIFFERENTIAL/PLATELET  PREGNANCY, URINE  RAPID URINE DRUG SCREEN, HOSP PERFORMED  CBG MONITORING, ED  TROPONIN I (HIGH SENSITIVITY)  TROPONIN I (HIGH SENSITIVITY)    EKG EKG Interpretation  Date/Time:  Monday July 02 2020 07:54:28 EST Ventricular Rate:  112 PR Interval:    QRS Duration: 98 QT Interval:  360 QTC Calculation: 492 R Axis:   13 Text Interpretation: Sinus tachycardia Probable left atrial enlargement RSR' in V1 or V2, right VCD or RVH Borderline prolonged QT interval Baseline wander in lead(s) III Since last tracing rate faster Confirmed by Isla Pence [phone]) on 07/02/2020 8:03:24 AM   Radiology CT  Angio Chest PE W and/or Wo Contrast  Result Date: 07/02/2020 CLINICAL DATA:  Shortness of breath.  Lower extremity edema EXAM: CT ANGIOGRAPHY CHEST WITH CONTRAST TECHNIQUE: Multidetector CT imaging of the chest was performed using the standard protocol during bolus administration of intravenous contrast. Multiplanar CT image reconstructions and MIPs were obtained to evaluate the vascular anatomy. CONTRAST:  151m OMNIPAQUE IOHEXOL 350 MG/ML SOLN COMPARISON:  CT  angiogram chest December 16, 2019; chest radiograph July 02, 2020 FINDINGS: Cardiovascular: No demonstrable pulmonary emboli evident. No evident thoracic aortic aneurysm. No dissection evident. The contrast bolus in the aorta is not sufficient for confident dissection assessment. Visualized great vessels appear unremarkable. Note that the right innominate and left common carotid arteries arise as a common trunk, an anatomic variant. No appreciable pericardial effusion or pericardial thickening. Heart is enlarged. There is again noted prominence of the main pulmonary outflow tract measuring 3.3 cm consistent with a degree of pulmonary arterial hypertension. Mediastinum/Nodes: Visualized thyroid appears normal. There are subcentimeter mediastinal lymph nodes without evident adenopathy by size criteria. No esophageal lesions are evident. Lungs/Pleura: There are scattered nodular appearing opacities in the lungs bilaterally, multifocal in location but most notable in the lower lobes. There also areas of airspace opacity in the inferior lingula and both lower lobe regions without significant consolidation. No pleural effusions are evident. Upper Abdomen: Reflux of contrast is noted into the inferior vena cava and hepatic veins. Visualized upper abdominal structures otherwise appear unremarkable. Musculoskeletal: No blastic or lytic bone lesions are evident. There are no chest wall lesions. Review of the MIP images confirms the above findings. IMPRESSION: 1. No evident pulmonary embolus. No thoracic aortic aneurysm. No dissection seen with contrast bolus in the aorta is less than optimal for potential dissection assessment. 2. Foci of airspace opacity throughout the lungs, most notably in the bilateral lower lobe and lingular regions. Several of these areas appear somewhat nodular without cavitation. The overall appearance is consistent with atypical organism pneumonia. In this regard, advise correlation with COVID-19  status. Nodular opacities of this nature conceivably could have neoplastic etiology, although atypical organism pneumonia is felt to be more likely. Given this circumstance, repeat CT in approximately 4-6 weeks after treatment for presumed infectious etiology advised. 3.  No adenopathy evident by size criteria. 4. Prominence of the main pulmonary outflow tract, a finding felt to be indicative of pulmonary arterial hypertension. Heart enlarged. 5. Reflux of contrast into the inferior vena cava and hepatic veins is felt to be indicative of increased right heart pressure. Electronically Signed   By: WLowella GripIII M.D.   On: 07/02/2020 10:09   DG Chest Port 1 View  Result Date: 07/02/2020 CLINICAL DATA:  Shortness of breath. EXAM: PORTABLE CHEST 1 VIEW COMPARISON:  Chest radiograph 12/17/2019. FINDINGS: Unchanged cardiomegaly. A rounded opacity projecting in the region of the lateral left lung base is favored to reflect a nipple shadow. No appreciable airspace consolidation or pulmonary edema. No evidence of pleural effusion or pneumothorax. No acute bony abnormality identified. IMPRESSION: A rounded opacity projecting in the region of the lateral left lung base is favored to reflect a nipple shadow. However, attention is recommended on the pending CT angiogram chest to exclude a pulmonary nodule at this site. Unchanged cardiomegaly. No airspace consolidation or pulmonary edema. Electronically Signed   By: KKellie SimmeringDO   On: 07/02/2020 08:17    Procedures Procedures (including critical care time)  Medications Ordered in ED Medications  iohexol (OMNIPAQUE) 350 MG/ML injection  100 mL (100 mLs Intravenous Contrast Given 07/02/20 0940)  furosemide (LASIX) injection 40 mg (40 mg Intravenous Given 07/02/20 1001)  labetalol (NORMODYNE) injection 10 mg (10 mg Intravenous Given 07/02/20 0959)  insulin aspart (novoLOG) injection 10 Units (10 Units Intravenous Given 07/02/20 1003)    ED Course  I  have reviewed the triage vital signs and the nursing notes.  Pertinent labs & imaging results that were available during my care of the patient were reviewed by me and considered in my medical decision making (see chart for details).    MDM Rules/Calculators/A&P                          Pt given IV lasix, IV labetalol, and IV insulin.  BP and BS and breathing have improved.  Pt's Covid swab negative. No PE.  SW consulted and they have verified that her medicaid is active.  She also called pt's pharmacy and they are able to fill her prescriptions with her number.  Pt is told to call for a new card.    Medications refilled.  Pt is to f/u with her pcp and with the chf clinic.    She is to eat a diabetic diet.  Return if worse.   CRITICAL CARE Performed by: Isla Pence   Total critical care time: 30 minutes  Critical care time was exclusive of separately billable procedures and treating other patients.  Critical care was necessary to treat or prevent imminent or life-threatening deterioration.  Critical care was time spent personally by me on the following activities: development of treatment plan with patient and/or surrogate as well as nursing, discussions with consultants, evaluation of patient's response to treatment, examination of patient, obtaining history from patient or surrogate, ordering and performing treatments and interventions, ordering and review of laboratory studies, ordering and review of radiographic studies, pulse oximetry and re-evaluation of patient's condition.  JYASIA MARKOFF was evaluated in Emergency Department on 07/02/2020 for the symptoms described in the history of present illness. She was evaluated in the context of the global COVID-19 pandemic, which necessitated consideration that the patient might be at risk for infection with the SARS-CoV-2 virus that causes COVID-19. Institutional protocols and algorithms that pertain to the evaluation of  patients at risk for COVID-19 are in a state of rapid change based on information released by regulatory bodies including the CDC and federal and state organizations. These policies and algorithms were followed during the patient's care in the ED.  Final Clinical Impression(s) / ED Diagnoses Final diagnoses:  Acute on chronic congestive heart failure, unspecified heart failure type ([place])  Nonischemic cardiomyopathy ([place])  Poorly controlled type 2 diabetes mellitus ([place])  Primary hypertension  Peripheral edema  Medication refill    Rx / DC Orders ED Discharge Orders         Ordered    apixaban (ELIQUIS) 5 MG TABS tablet  2 times daily        07/02/20 1319    atorvastatin (LIPITOR) 40 MG tablet  Daily-1800        07/02/20 1319    blood glucose meter kit and supplies        07/02/20 1319    carvedilol (COREG) 6.25 MG tablet  2 times daily with meals       Note to Pharmacy: Please cancel all previous orders for current medication. Change in dosage or pill size.   07/02/20 1319    digoxin (LANOXIN) 0.125 MG  tablet  Daily        07/02/20 1319    empagliflozin (JARDIANCE) 10 MG TABS tablet  Daily        07/02/20 1319    furosemide (LASIX) 20 MG tablet  Daily        07/02/20 1319    gabapentin (NEURONTIN) 300 MG capsule  3 times daily        07/02/20 1319    insulin glargine (LANTUS) 100 UNIT/ML Solostar Pen  Daily        07/02/20 1319    insulin aspart (NOVOLOG FLEXPEN) 100 UNIT/ML FlexPen  3 times daily with meals        07/02/20 1319    metFORMIN (GLUCOPHAGE) 500 MG tablet  2 times daily with meals        07/02/20 1319    PARoxetine (PAXIL) 10 MG tablet  Daily        07/02/20 1319    sacubitril-valsartan (ENTRESTO) 24-26 MG  2 times daily        07/02/20 1319    spironolactone (ALDACTONE) 25 MG tablet  Daily        07/02/20 1319           Isla Pence, MD 07/02/20 [place], Julie, MD 07/02/20 1324

## 2020-07-02 NOTE — ED Notes (Signed)
CRITICAL VALUE ALERT  Critical Value:  Glucose 581  Date & Time Notied:  07/02/2020, 8280  Provider Notified: Dr. Particia Nearing  Orders Received/Actions taken: see chart

## 2020-07-02 NOTE — Clinical Social Work Note (Signed)
Transition of Care [facility]) - Emergency Department Mini Assessment  Patient Details  Name: Cheryl Mercer MRN: [id number] Date of Birth: 04-13-1986  [facility]) CM/SW Contact:    Ewing Schlein, LCSW Phone Number: 07/02/2020, 1:22 PM  Clinical Narrative: Patient is a 34 year old female who presented to the ED for CHF and swollen feet. TOC received consult for insurance/medication assistance as patient was unsure if her Medicaid is current. CSW verified with registration the patient's insurance was e-verified and is active as of 02/16/20. CSW also [facility] and confirmed [facility] current [facility] number. CSW explained situation to patient and provided her with facesheet with her insurance information. CSW encouraged patient to request a new Medicaid card. TOC signing off.  ED Mini Assessment: What brought you to the Emergency Department? : CHF, swollen feet Barriers to Discharge: ED Medication assistance, ED Barriers Resolved Barrier interventions: Verified patient's insurance with registration and her pharmacy Means of departure: Car Interventions which prevented an admission or readmission: Medication Review, Other (must enter comment) [employer]eron[employer] insurance)  Patient Contact and Communications Key Contact 1: [facility] ([address], [place], [place] [address]) Contact Date: 07/02/20     Choice offered to / list presented to : NA  Admission diagnosis:  CHF-FEET SWOLLEN Patient Active Problem List   Diagnosis Date Noted  . Apnea   . Pulmonary infarct (HCC) 12/17/2019  . Pulmonary embolus (HCC) 12/16/2019  . Community acquired pneumonia 12/16/2019  . Dilated cardiomyopathy (HCC) 11/10/2019  . History of COVID-19 11/07/2019  . Acute on chronic combined systolic (congestive) and diastolic (congestive) heart failure (HCC) 11/07/2019  . Elevated troponin 11/07/2019  . Viral pneumonia   . Tachycardia   . Multifocal pneumonia 09/07/2019  . Chronic combined systolic and  diastolic congestive heart failure (HCC) 09/07/2019  . Dyslipidemia 09/07/2019  . Back pain 09/07/2019  . Atypical pneumonia 09/07/2019  . Essential hypertension 03/24/2019  . Uncontrolled type 2 diabetes mellitus with hyperglycemia (HCC) 03/23/2019  . Peripheral edema 03/23/2019  . Cardiomegaly 03/23/2019  . Acute on chronic combined systolic and diastolic CHF (congestive heart failure) (HCC) 03/23/2019  . Proteinuria 03/23/2019  . Acute bacterial tonsillitis 05/18/2013    Class: Acute  . Lower urinary tract infectious disease 07/30/2012   PCP:  Evelene Croon, MD Pharmacy:   [phone] [place] - [address] [place] [address] Phone: [phone] Fax: [phone]

## 2020-07-09 ENCOUNTER — Encounter (HOSPITAL_COMMUNITY): Payer: Self-pay | Admitting: Emergency Medicine

## 2020-07-09 ENCOUNTER — Encounter (HOSPITAL_COMMUNITY): Payer: Self-pay

## 2020-07-09 ENCOUNTER — Emergency Department (HOSPITAL_COMMUNITY): Payer: Medicaid Other

## 2020-07-09 ENCOUNTER — Other Ambulatory Visit: Payer: Self-pay

## 2020-07-09 ENCOUNTER — Emergency Department (HOSPITAL_COMMUNITY)
Admission: EM | Admit: 2020-07-09 | Discharge: 2020-07-09 | Disposition: A | Discharge status: Home / Self Care | Payer: Medicaid Other | Source: Home / Self Care

## 2020-07-09 ENCOUNTER — Inpatient Hospital Stay (HOSPITAL_COMMUNITY)
Admission: EM | Admit: 2020-07-09 | Discharge: 2020-07-11 | DRG: 291 | Disposition: A | Discharge status: Home / Self Care | Payer: Medicaid Other | Attending: Internal Medicine | Admitting: Internal Medicine

## 2020-07-09 DIAGNOSIS — Z8616 Personal history of COVID-19: Secondary | ICD-10-CM

## 2020-07-09 DIAGNOSIS — Z86711 Personal history of pulmonary embolism: Secondary | ICD-10-CM

## 2020-07-09 DIAGNOSIS — E785 Hyperlipidemia, unspecified: Secondary | ICD-10-CM | POA: Diagnosis present

## 2020-07-09 DIAGNOSIS — E1165 Type 2 diabetes mellitus with hyperglycemia: Secondary | ICD-10-CM | POA: Diagnosis present

## 2020-07-09 DIAGNOSIS — Z5321 Procedure and treatment not carried out due to patient leaving prior to being seen by health care provider: Secondary | ICD-10-CM | POA: Insufficient documentation

## 2020-07-09 DIAGNOSIS — I5043 Acute on chronic combined systolic (congestive) and diastolic (congestive) heart failure: Secondary | ICD-10-CM | POA: Diagnosis not present

## 2020-07-09 DIAGNOSIS — R609 Edema, unspecified: Secondary | ICD-10-CM | POA: Diagnosis not present

## 2020-07-09 DIAGNOSIS — R809 Proteinuria, unspecified: Secondary | ICD-10-CM | POA: Diagnosis present

## 2020-07-09 DIAGNOSIS — I110 Hypertensive heart disease with heart failure: Principal | ICD-10-CM | POA: Diagnosis present

## 2020-07-09 DIAGNOSIS — I509 Heart failure, unspecified: Secondary | ICD-10-CM | POA: Insufficient documentation

## 2020-07-09 DIAGNOSIS — J90 Pleural effusion, not elsewhere classified: Secondary | ICD-10-CM | POA: Diagnosis not present

## 2020-07-09 DIAGNOSIS — M7989 Other specified soft tissue disorders: Secondary | ICD-10-CM | POA: Insufficient documentation

## 2020-07-09 DIAGNOSIS — J811 Chronic pulmonary edema: Secondary | ICD-10-CM | POA: Diagnosis not present

## 2020-07-09 DIAGNOSIS — I428 Other cardiomyopathies: Secondary | ICD-10-CM | POA: Diagnosis present

## 2020-07-09 DIAGNOSIS — Z8249 Family history of ischemic heart disease and other diseases of the circulatory system: Secondary | ICD-10-CM

## 2020-07-09 DIAGNOSIS — Z91120 Patient's intentional underdosing of medication regimen due to financial hardship: Secondary | ICD-10-CM

## 2020-07-09 DIAGNOSIS — R0602 Shortness of breath: Secondary | ICD-10-CM | POA: Insufficient documentation

## 2020-07-09 DIAGNOSIS — Z9111 Patient's noncompliance with dietary regimen: Secondary | ICD-10-CM

## 2020-07-09 DIAGNOSIS — R5381 Other malaise: Secondary | ICD-10-CM | POA: Diagnosis not present

## 2020-07-09 DIAGNOSIS — T465X6A Underdosing of other antihypertensive drugs, initial encounter: Secondary | ICD-10-CM | POA: Diagnosis present

## 2020-07-09 DIAGNOSIS — Z79899 Other long term (current) drug therapy: Secondary | ICD-10-CM

## 2020-07-09 DIAGNOSIS — R52 Pain, unspecified: Secondary | ICD-10-CM | POA: Diagnosis not present

## 2020-07-09 DIAGNOSIS — Z7984 Long term (current) use of oral hypoglycemic drugs: Secondary | ICD-10-CM

## 2020-07-09 DIAGNOSIS — E1149 Type 2 diabetes mellitus with other diabetic neurological complication: Secondary | ICD-10-CM | POA: Diagnosis present

## 2020-07-09 DIAGNOSIS — I10 Essential (primary) hypertension: Secondary | ICD-10-CM | POA: Diagnosis present

## 2020-07-09 DIAGNOSIS — Z8701 Personal history of pneumonia (recurrent): Secondary | ICD-10-CM

## 2020-07-09 DIAGNOSIS — E1169 Type 2 diabetes mellitus with other specified complication: Secondary | ICD-10-CM | POA: Diagnosis present

## 2020-07-09 DIAGNOSIS — E782 Mixed hyperlipidemia: Secondary | ICD-10-CM | POA: Diagnosis present

## 2020-07-09 DIAGNOSIS — E119 Type 2 diabetes mellitus without complications: Secondary | ICD-10-CM | POA: Diagnosis present

## 2020-07-09 DIAGNOSIS — Z794 Long term (current) use of insulin: Secondary | ICD-10-CM

## 2020-07-09 DIAGNOSIS — I517 Cardiomegaly: Secondary | ICD-10-CM | POA: Diagnosis not present

## 2020-07-09 LAB — HEPATIC FUNCTION PANEL
ALT: 29 U/L (ref 0–44)
AST: 79 U/L — ABNORMAL HIGH (ref 15–41)
Albumin: 2.7 g/dL — ABNORMAL LOW (ref 3.5–5.0)
Alkaline Phosphatase: 238 U/L — ABNORMAL HIGH (ref 38–126)
Bilirubin, Direct: 0.2 mg/dL (ref 0.0–0.2)
Indirect Bilirubin: 0.2 mg/dL — ABNORMAL LOW (ref 0.3–0.9)
Total Bilirubin: 0.4 mg/dL (ref 0.3–1.2)
Total Protein: 7.5 g/dL (ref 6.5–8.1)

## 2020-07-09 LAB — BASIC METABOLIC PANEL
Anion gap: 10 (ref 5–15)
Anion gap: 9 (ref 5–15)
BUN: 10 mg/dL (ref 6–20)
BUN: 13 mg/dL (ref 6–20)
CO2: 25 mmol/L (ref 22–32)
CO2: 25 mmol/L (ref 22–32)
Calcium: 8.3 mg/dL — ABNORMAL LOW (ref 8.9–10.3)
Calcium: 8.2 mg/dL — ABNORMAL LOW (ref 8.9–10.3)
Chloride: 100 mmol/L (ref 98–111)
Chloride: 97 mmol/L — ABNORMAL LOW (ref 98–111)
Creatinine, Ser: 0.62 mg/dL (ref 0.44–1.00)
Creatinine, Ser: 0.69 mg/dL (ref 0.44–1.00)
GFR, Estimated: >60 mL/min (ref >60)
GFR, Estimated: >60 mL/min (ref >60)
Glucose, Bld: 277 mg/dL — ABNORMAL HIGH (ref 70–99)
Glucose, Bld: 438 mg/dL — ABNORMAL HIGH (ref 70–99)
Potassium: 3.9 mmol/L (ref 3.5–5.1)
Potassium: 4.9 mmol/L (ref 3.5–5.1)
Sodium: 135 mmol/L (ref 135–145)
Sodium: 131 mmol/L — ABNORMAL LOW (ref 135–145)

## 2020-07-09 LAB — CBC
HCT: 38.8 % (ref 36.0–46.0)
Hemoglobin: 12.1 g/dL (ref 12.0–15.0)
MCH: 29.7 pg (ref 26.0–34.0)
MCHC: 31.2 g/dL (ref 30.0–36.0)
MCV: 95.1 fL (ref 80.0–100.0)
Platelets: 345 10*3/uL (ref 150–400)
RBC: 4.08 MIL/uL (ref 3.87–5.11)
RDW: 13.2 % (ref 11.5–15.5)
WBC: 8.8 10*3/uL (ref 4.0–10.5)
nRBC: 0 % (ref 0.0–0.2)

## 2020-07-09 LAB — URINALYSIS, ROUTINE W REFLEX MICROSCOPIC
Bilirubin Urine: NEGATIVE
Glucose, UA: >=500 mg/dL — AB
Hgb urine dipstick: NEGATIVE
Ketones, ur: NEGATIVE mg/dL
Leukocytes,Ua: NEGATIVE
Nitrite: NEGATIVE
Protein, ur: >=300 mg/dL — AB
Specific Gravity, Urine: 1.027 (ref 1.005–1.030)
pH: 6 (ref 5.0–8.0)

## 2020-07-09 LAB — RESP PANEL BY RT-PCR (FLU A&B, COVID) ARPGX2
Influenza A by PCR: NEGATIVE
Influenza B by PCR: NEGATIVE
SARS Coronavirus 2 by RT PCR: NEGATIVE

## 2020-07-09 LAB — BRAIN NATRIURETIC PEPTIDE
B Natriuretic Peptide: 705 pg/mL — ABNORMAL HIGH (ref 0.0–100.0)
B Natriuretic Peptide: 856 pg/mL — ABNORMAL HIGH (ref 0.0–100.0)

## 2020-07-09 LAB — BLOOD GAS, VENOUS
Acid-Base Excess: 5.1 mmol/L — ABNORMAL HIGH (ref 0.0–2.0)
Bicarbonate: 27.7 mmol/L (ref 20.0–28.0)
Drawn by: 1528
FIO2: 21
O2 Saturation: 48.3 %
Patient temperature: 37
pCO2, Ven: 42.3 mmHg — ABNORMAL LOW (ref 44.0–60.0)
pH, Ven: 7.451 — ABNORMAL HIGH (ref 7.250–7.430)
pO2, Ven: <31 mmHg — CL (ref 32.0–45.0)

## 2020-07-09 LAB — TROPONIN I (HIGH SENSITIVITY)
Troponin I (High Sensitivity): 15 ng/L (ref <18)
Troponin I (High Sensitivity): 14 ng/L (ref <18)

## 2020-07-09 LAB — I-STAT BETA HCG BLOOD, ED (MC, WL, AP ONLY): I-stat hCG, quantitative: <5 m[IU]/mL (ref <5)

## 2020-07-09 LAB — CBG MONITORING, ED
Glucose-Capillary: 266 mg/dL — ABNORMAL HIGH (ref 70–99)
Glucose-Capillary: 411 mg/dL — ABNORMAL HIGH (ref 70–99)

## 2020-07-09 LAB — MAGNESIUM: Magnesium: 1.6 mg/dL — ABNORMAL LOW (ref 1.7–2.4)

## 2020-07-09 LAB — PHOSPHORUS: Phosphorus: 3.5 mg/dL (ref 2.5–4.6)

## 2020-07-09 MED ORDER — INSULIN ASPART 100 UNIT/ML ~~LOC~~ SOLN
0.0000 [IU] | Freq: Three times a day (TID) | SUBCUTANEOUS | Status: DC
  Administered 2020-07-10: 11 [IU] via SUBCUTANEOUS
  Administered 2020-07-10 – 2020-07-11 (×3): 5 [IU] via SUBCUTANEOUS

## 2020-07-09 MED ORDER — ONDANSETRON HCL 4 MG PO TABS: 4.0000 mg | ORAL_TABLET | Freq: Four times a day (QID) | ORAL | Status: DC | PRN

## 2020-07-09 MED ORDER — ONDANSETRON HCL 4 MG/2ML IJ SOLN: 4.0000 mg | Freq: Four times a day (QID) | INTRAMUSCULAR | Status: DC | PRN

## 2020-07-09 MED ORDER — ACETAMINOPHEN 650 MG RE SUPP: 650.0000 mg | Freq: Four times a day (QID) | RECTAL | Status: DC | PRN

## 2020-07-09 MED ORDER — ACETAMINOPHEN 325 MG PO TABS
650.0000 mg | ORAL_TABLET | Freq: Four times a day (QID) | ORAL | Status: DC | PRN
  Administered 2020-07-09 – 2020-07-10 (×2): 650 mg via ORAL
  Filled 2020-07-09 (×2): qty 2

## 2020-07-09 MED ORDER — ENOXAPARIN SODIUM 40 MG/0.4ML ~~LOC~~ SOLN
40.0000 mg | SUBCUTANEOUS | Status: DC
  Administered 2020-07-10 – 2020-07-11 (×2): 40 mg via SUBCUTANEOUS
  Filled 2020-07-09 (×2): qty 0.4

## 2020-07-09 MED ORDER — INSULIN ASPART 100 UNIT/ML ~~LOC~~ SOLN
10.0000 [IU] | Freq: Once | SUBCUTANEOUS | Status: AC
  Administered 2020-07-09: 10 [IU] via SUBCUTANEOUS
  Filled 2020-07-09: qty 1

## 2020-07-09 MED ORDER — FUROSEMIDE 10 MG/ML IJ SOLN
40.0000 mg | Freq: Two times a day (BID) | INTRAMUSCULAR | Status: DC
  Administered 2020-07-10 – 2020-07-11 (×3): 40 mg via INTRAVENOUS
  Filled 2020-07-09 (×3): qty 4

## 2020-07-09 MED ORDER — FUROSEMIDE 10 MG/ML IJ SOLN
40.0000 mg | Freq: Once | INTRAMUSCULAR | Status: AC
  Administered 2020-07-09: 40 mg via INTRAVENOUS
  Filled 2020-07-09: qty 4

## 2020-07-09 MED ORDER — LISINOPRIL 10 MG PO TABS
10.0000 mg | ORAL_TABLET | Freq: Every day | ORAL | Status: DC
  Administered 2020-07-09 – 2020-07-11 (×3): 10 mg via ORAL
  Filled 2020-07-09 (×3): qty 1

## 2020-07-09 NOTE — ED Notes (Signed)
Pt not at [facility] at this time. [facility] called stating she cannot be introduced into [employer] at this time. Pt to be discharged at this time LWBS

## 2020-07-09 NOTE — ED Triage Notes (Signed)
Pt presents to ED via [facility] for continued bilateral leg edema. Pt with hx CHF. Pt states she has been using lasix at home with no improvement.

## 2020-07-09 NOTE — ED Notes (Signed)
ED Provider at bedside. 

## 2020-07-09 NOTE — ED Notes (Signed)
admitting Provider at bedside. 

## 2020-07-09 NOTE — ED Triage Notes (Signed)
Pt reports hx of CHF, swelling to BLLE x1 week, given lasix and sent home after being seen at [facility] on 11/15, states swelling to LEs has not improved, pt also endorses sob.

## 2020-07-09 NOTE — TOC Initial Note (Signed)
Transition of Care [facility]) - Initial/Assessment Note    Patient Details  Name: Cheryl Mercer MRN: [id number] Date of Birth: 22-Sep-1985  Transition of Care [facility]) CM/SW Contact:    Villa Herb, LCSWA Phone Number: 07/09/2020, 10:24 PM  Clinical Narrative:                 Pt admitted for Acute on chronic combined systolic and diastolic CHF. TOC received consult for CHF screen. Pt lives with her husband and 3 children. Pt states that she completes ADLs independently and drives. Pt states that she has not had HH services but has had a paramedic come to the home. Pt states the paramedic came out to check blood sugar, blood pressure, and provide education for pt on insulin. Pt does not use any DME and is not on home O2.   CSW completed CHF screen with pt. Pt states that she does not weigh daily. CSW provided pt with education on importance of daily weights and consulting with cardiologist if 2 lb or more increase. Pt states she is working on starting a heart health diet. Pts cardiologist is Dr. Gala Romney. Pt states that she takes all medications as prescribed. TOC to follow.   Expected Discharge Plan: Home/Self Care Barriers to Discharge: Continued Medical Work up   Patient Goals and CMS Choice Patient states their goals for this hospitalization and ongoing recovery are:: Return home   Choice offered to / list presented to : NA  Expected Discharge Plan and Services Expected Discharge Plan: Home/Self Care In-house Referral: Clinical Social Work Discharge Planning Services: NA Post Acute Care Choice: NA Living arrangements for the past 2 months: Mobile Home                                      Prior Living Arrangements/Services Living arrangements for the past 2 months: Mobile Home Lives with:: Spouse, Minor Children Patient language and need for interpreter reviewed:: Yes Do you feel safe going back to the place where you live?: Yes          Current home  services: Other (comment) (Paramedic visits) Criminal Activity/Legal Involvement Pertinent to Current Situation/Hospitalization: No - Comment as needed  Activities of Daily Living      Permission Sought/Granted                  Emotional Assessment Appearance:: Appears stated age Attitude/Demeanor/Rapport: Engaged Affect (typically observed): Accepting Orientation: : Oriented to Self, Oriented to  Time, Oriented to Place, Oriented to Situation Alcohol / Substance Use: Not Applicable Psych Involvement: No (comment)  Admission diagnosis:  Acute on chronic combined systolic and diastolic CHF (congestive heart failure) (HCC) [I50.43] Patient Active Problem List   Diagnosis Date Noted  . Apnea   . Pulmonary infarct (HCC) 12/17/2019  . Pulmonary embolus (HCC) 12/16/2019  . Community acquired pneumonia 12/16/2019  . Dilated cardiomyopathy (HCC) 11/10/2019  . History of COVID-19 11/07/2019  . Acute on chronic combined systolic (congestive) and diastolic (congestive) heart failure (HCC) 11/07/2019  . Elevated troponin 11/07/2019  . Viral pneumonia   . Tachycardia   . Multifocal pneumonia 09/07/2019  . Chronic combined systolic and diastolic congestive heart failure (HCC) 09/07/2019  . Dyslipidemia 09/07/2019  . Back pain 09/07/2019  . Atypical pneumonia 09/07/2019  . Essential hypertension 03/24/2019  . Uncontrolled type 2 diabetes mellitus with hyperglycemia (HCC) 03/23/2019  . Peripheral edema 03/23/2019  .  Cardiomegaly 03/23/2019  . Acute on chronic combined systolic and diastolic CHF (congestive heart failure) (HCC) 03/23/2019  . Proteinuria 03/23/2019  . Acute bacterial tonsillitis 05/18/2013    Class: Acute  . Lower urinary tract infectious disease 07/30/2012   PCP:  Evelene Croon, MD Pharmacy:   [facility] [phone] - [address] [place] - [address] [place] [address] Phone: [phone] Fax: [phone]     Social Determinants of  Health (SDOH) Interventions    Readmission Risk Interventions No flowsheet data found.

## 2020-07-09 NOTE — ED Notes (Signed)
Report given to RN on floor, awaiting Dr. Robb Matar to complete his bedside assessment and will transport pt upstairs.

## 2020-07-09 NOTE — ED Notes (Signed)
Report attempted 

## 2020-07-09 NOTE — ED Provider Notes (Signed)
[facility] EMERGENCY DEPARTMENT Provider Note   CSN: [id number] Arrival date & time: 07/09/20  1853     History Chief Complaint  Patient presents with  . Leg Swelling    Cheryl Mercer is a 34 y.o. female.  HPI      Cheryl Mercer is a 34 y.o. female, with a history of DM type II, PE, HTN, nonischemic cardiomyopathy (EF 25 to 30% May 2021), presenting to the ED with shortness of breath increasing for the last 2 weeks. She has also had increasing lower extremity edema and worsening orthopnea.  She had been out of her medications when she was seen in the emergency department November 15.  She presented with similar complaints at that time, however, her symptoms have since worsened. She states she has been compliant with her medications since that visit.  Patient states she originally went to [facility] today, however, wait time was too long and she decided to come here instead.  Denies fever/chills, abdominal pain, N/V/D, urinary complaints, syncope, or any other complaints.  Past Medical History:  Diagnosis Date  . CHF (congestive heart failure) ([place])   . Chronic back pain   . Diabetes mellitus without complication (Cheryl Mercer)   . Migraines   . No pertinent past medical history   . Pregnancy induced hypertension     Patient Active Problem List   Diagnosis Date Noted  . Apnea   . Pulmonary infarct (Cheryl Mercer) 12/17/2019  . Pulmonary embolus ([place]) 12/16/2019  . Community acquired pneumonia 12/16/2019  . Dilated cardiomyopathy (Cheryl Mercer) 11/10/2019  . History of COVID-19 11/07/2019  . Acute on chronic combined systolic (congestive) and diastolic (congestive) heart failure (Cheryl Mercer) 11/07/2019  . Elevated troponin 11/07/2019  . Viral pneumonia   . Tachycardia   . Multifocal pneumonia 09/07/2019  . Chronic combined systolic and diastolic congestive heart failure (Cheryl Mercer) 09/07/2019  . Dyslipidemia 09/07/2019  . Back pain 09/07/2019  . Atypical pneumonia 09/07/2019  .  Essential hypertension 03/24/2019  . Uncontrolled type 2 diabetes mellitus with hyperglycemia (Cheryl Mercer) 03/23/2019  . Peripheral edema 03/23/2019  . Cardiomegaly 03/23/2019  . Acute on chronic combined systolic and diastolic CHF (congestive heart failure) (Cheryl Mercer) 03/23/2019  . Proteinuria 03/23/2019  . Acute bacterial tonsillitis 05/18/2013    Class: Acute  . Lower urinary tract infectious disease 07/30/2012    Past Surgical History:  Procedure Laterality Date  . CERVICAL BIOPSY  W/ LOOP ELECTRODE EXCISION  2009  . Laparoscopic tubal sterilization with Falope rings.  2009  . RIGHT/LEFT HEART CATH AND CORONARY ANGIOGRAPHY N/A 03/25/2019   Procedure: RIGHT/LEFT HEART CATH AND CORONARY ANGIOGRAPHY;  Surgeon: Jolaine Artist, MD;  Location: [facility];  Service: Cardiovascular;  Laterality: N/A;  . TUBAL LIGATION       OB History    Gravida  4   Para  4   Term  4   Preterm      AB      Living  4     SAB      TAB      Ectopic      Multiple      Live Births  4           Family History  Problem Relation Age of Onset  . Hypertension Mother   . Anesthesia problems Neg Hx     Social History   Tobacco Use  . Smoking status: Never Smoker  . Smokeless tobacco: Never Used  Vaping Use  . Vaping  Use: Never used  Substance Use Topics  . Alcohol use: No    Alcohol/week: 6.0 standard drinks    Types: 6 Shots of liquor per week  . Drug use: No    Types: Marijuana    Home Medications Prior to Admission medications   Medication Sig Start Date End Date Taking? Authorizing Provider  atorvastatin (LIPITOR) 40 MG tablet Take 1 tablet (40 mg total) by mouth daily at 6 PM. 07/02/20  Yes Isla Pence, MD  carvedilol (COREG) 6.25 MG tablet Take 1 tablet (6.25 mg total) by mouth 2 (two) times daily with a meal. 07/02/20  Yes Isla Pence, MD  gabapentin (NEURONTIN) 300 MG capsule Take 1 capsule (300 mg total) by mouth 3 (three) times daily. 07/02/20  Yes  Isla Pence, MD  insulin aspart (NOVOLOG FLEXPEN) 100 UNIT/ML FlexPen Inject 10 Units into the skin 3 (three) times daily with meals. 07/02/20  Yes Isla Pence, MD  insulin glargine (LANTUS) 100 UNIT/ML Solostar Pen Inject 40 Units into the skin daily. 07/02/20  Yes Isla Pence, MD  metFORMIN (GLUCOPHAGE) 500 MG tablet Take 1 tablet (500 mg total) by mouth 2 (two) times daily with a meal. 07/02/20  Yes Isla Pence, MD  apixaban (ELIQUIS) 5 MG TABS tablet Take 1 tablet (5 mg total) by mouth 2 (two) times daily. Patient not taking: Reported on 07/09/2020 07/02/20   Isla Pence, MD  blood glucose meter kit and supplies Dispense based on patient and insurance preference. Check blood sugar 4 times daily, three times before meals and once before bedtime. (FOR ICD-10 E10.9, E11.9). 07/02/20   Isla Pence, MD  cyclobenzaprine (FLEXERIL) 10 MG tablet Take 10 mg by mouth 3 (three) times daily as needed for muscle spasms. Patient not taking: Reported on 07/09/2020 04/07/19   [provider]  digoxin (LANOXIN) 0.125 MG tablet Take 1 tablet (0.125 mg total) by mouth daily. Patient not taking: Reported on 07/09/2020 07/02/20   Isla Pence, MD  empagliflozin (JARDIANCE) 10 MG TABS tablet Take 1 tablet (10 mg total) by mouth daily. Patient not taking: Reported on 07/09/2020 07/02/20   Isla Pence, MD  furosemide (LASIX) 20 MG tablet Take 1 tablet (20 mg total) by mouth daily. 07/02/20   Isla Pence, MD  PARoxetine (PAXIL) 10 MG tablet Take 1 tablet (10 mg total) by mouth daily. Patient not taking: Reported on 07/09/2020 07/02/20   Isla Pence, MD  rizatriptan (MAXALT) 10 MG tablet Take 1 tablet (10 mg total) by mouth as needed for migraine. May repeat in 2 hours if needed Patient not taking: Reported on 07/09/2020 09/08/19   Max Sane, MD  sacubitril-valsartan (ENTRESTO) 24-26 MG Take 1 tablet by mouth 2 (two) times daily. Patient not taking: Reported on 07/09/2020  07/02/20   Isla Pence, MD  spironolactone (ALDACTONE) 25 MG tablet Take 1 tablet (25 mg total) by mouth daily. Patient not taking: Reported on 07/09/2020 07/02/20   Isla Pence, MD    Allergies    Ibuprofen  Review of Systems   Review of Systems  Constitutional: Negative for chills, diaphoresis and fever.  Respiratory: Positive for shortness of breath. Negative for cough.   Cardiovascular: Positive for leg swelling.  Gastrointestinal: Negative for abdominal pain, diarrhea, nausea and vomiting.  Neurological: Negative for weakness and numbness.  All other systems reviewed and are negative.   Physical Exam Updated Vital Signs BP (!) 131/105   Pulse (!) 126   Temp 98.9 F (37.2 C) (Oral)   Resp 19  Ht _0  (1.575 m)   Wt 60.3 kg   LMP 06/30/2020   SpO2 99%   BMI 24.33 kg/m   Physical Exam Vitals and nursing note reviewed.  Constitutional:      General: She is not in acute distress.    Appearance: She is well-developed. She is not diaphoretic.  HENT:     Head: Normocephalic and atraumatic.     Mouth/Throat:     Mouth: Mucous membranes are moist.     Pharynx: Oropharynx is clear.  Eyes:     Conjunctiva/sclera: Conjunctivae normal.  Cardiovascular:     Rate and Rhythm: Regular rhythm. Tachycardia present.     Pulses: Normal pulses.          Radial pulses are 2+ on the right side and 2+ on the left side.       Dorsalis pedis pulses are 2+ on the right side and 2+ on the left side.     Heart sounds: Normal heart sounds.     Comments: Tactile temperature in the extremities appropriate and equal bilaterally. Pulmonary:     Breath sounds: Normal breath sounds.     Comments: Tachypnea and increased work of breathing with even mild exertion. Abdominal:     Palpations: Abdomen is soft.     Tenderness: There is no abdominal tenderness. There is no guarding.  Musculoskeletal:     Cervical back: Neck supple.     Right lower leg: Edema present.     Left lower leg:  Edema present.  Skin:    General: Skin is warm and dry.  Neurological:     Mental Status: She is alert.  Psychiatric:        Mood and Affect: Mood and affect normal.        Speech: Speech normal.        Behavior: Behavior normal.     ED Results / Procedures / Treatments   Labs (all labs ordered are listed, but only abnormal results are displayed) Labs Reviewed  URINALYSIS, ROUTINE W REFLEX MICROSCOPIC - Abnormal; Notable for the following components:      Result Value   APPearance HAZY (*)    Glucose, UA >=500 (*)    Protein, ur >=300 (*)    Bacteria, UA RARE (*)    All other components within normal limits  BLOOD GAS, VENOUS - Abnormal; Notable for the following components:   pH, Ven 7.451 (*)    pCO2, Ven 42.3 (*)    pO2, Ven <31.0 (*)    Acid-Base Excess 5.1 (*)    All other components within normal limits  BASIC METABOLIC PANEL - Abnormal; Notable for the following components:   Sodium 131 (*)    Chloride 97 (*)    Glucose, Bld 438 (*)    Calcium 8.2 (*)    All other components within normal limits  BRAIN NATRIURETIC PEPTIDE - Abnormal; Notable for the following components:   B Natriuretic Peptide 856.0 (*)    All other components within normal limits  MAGNESIUM - Abnormal; Notable for the following components:   Magnesium 1.6 (*)    All other components within normal limits  HEPATIC FUNCTION PANEL - Abnormal; Notable for the following components:   Albumin 2.7 (*)    AST 79 (*)    Alkaline Phosphatase 238 (*)    Indirect Bilirubin 0.2 (*)    All other components within normal limits  CBG MONITORING, ED - Abnormal; Notable for the following components:  Glucose-Capillary 411 (*)    All other components within normal limits  RESP PANEL BY RT-PCR (FLU A&B, COVID) ARPGX2  PHOSPHORUS  HEMOGLOBIN T5V  BASIC METABOLIC PANEL  TROPONIN I (HIGH SENSITIVITY)  TROPONIN I (HIGH SENSITIVITY)   Lab results that were obtained earlier today at [facility] were also  reviewed.  Patient had negative pregnancy test at that time.  EKG EKG Interpretation  Date/Time:  Monday July 09 2020 19:36:51 EST Ventricular Rate:  124 PR Interval:    QRS Duration: 91 QT Interval:  326 QTC Calculation: 469 R Axis:   -14 Text Interpretation: Sinus tachycardia Probable left atrial enlargement Nonspecific T abnormalities, lateral leads No significant change since last tracing Confirmed by Fredia Sorrow [phone]) on 07/09/2020 8:50:31 PM   Radiology DG Chest 2 View  Result Date: 07/09/2020 CLINICAL DATA:  Shortness of breath.  History of CHF. EXAM: CHEST - 2 VIEW COMPARISON:  CT and chest radiograph July 02, 2020. FINDINGS: Enlarged cardiac silhouette. Pulmonary vascular congestion. No consolidation. Small right pleural effusion. No visible pneumothorax. No acute osseous abnormality. IMPRESSION: 1. Cardiomegaly and pulmonary vascular congestion without overt pulmonary edema. Small right pleural effusion. 2. Please note that airspace opacities seen on recent CT chest were not well visualized by radiograph at that time. Please see recent CT chest for follow-up recommendations. Electronically Signed   By: Margaretha Sheffield MD   On: 07/09/2020 12:17    Procedures Procedures (including critical care time)  Medications Ordered in ED Medications  insulin aspart (novoLOG) injection 0-15 Units (has no administration in time range)  enoxaparin (LOVENOX) injection 40 mg (has no administration in time range)  acetaminophen (TYLENOL) tablet 650 mg (650 mg Oral Given 07/09/20 2241)    Or  acetaminophen (TYLENOL) suppository 650 mg ( Rectal See Alternative 07/09/20 2241)  ondansetron (ZOFRAN) tablet 4 mg (has no administration in time range)    Or  ondansetron (ZOFRAN) injection 4 mg (has no administration in time range)  furosemide (LASIX) injection 40 mg (has no administration in time range)  lisinopril (ZESTRIL) tablet 10 mg (10 mg Oral Given 07/09/20 2241)  insulin  aspart (novoLOG) injection 10 Units (10 Units Subcutaneous Given 07/09/20 2111)  furosemide (LASIX) injection 40 mg (40 mg Intravenous Given 07/09/20 2141)    ED Course  I have reviewed the triage vital signs and the nursing notes.  Pertinent labs & imaging results that were available during my care of the patient were reviewed by me and considered in my medical decision making (see chart for details).  Clinical Course as of Jul 09 2354  Mon Jul 09, 2020  2048 Should read 126.  Pulse Rate(!): 26 [SJ]  2150 Spoke with Dr. Vickki Muff, cardiology fellow.  We discussed the patient's symptoms, physical exam presentation, available lab results, and chest x-ray. He agrees with the course of management to diurese the patient.  Since the patient has had outpatient oral diuresis, the next step would be to admit the patient for further management.  He would not add on other medications, such as beta-blocker at this time. Unless she becomes hemodynamically unstable, she does not need to be transferred to [facility].  He recommends face-to-face evaluation by cardiology team at [facility] tomorrow morning.   [SJ]    Clinical Course User Index [SJ] Kayleeann Huxford, Maceo Pro   MDM Rules/Calculators/A&P                          Patient  presents with shortness of breath and evidence of fluid overload. Tachypneic and tachycardic, however, nontoxic-appearing, afebrile, not hypotensive, not hypoxic. I personally reviewed and interpreted the patient's labs and imaging studies. Elevated BNP.  No troponin elevation. Admitted for diuresis and evaluation by cardiology in the morning.  Findings and plan of care discussed with Fredia Sorrow, MD.    Vitals:   07/09/20 2230 07/09/20 2300 07/09/20 2330 07/09/20 2355  BP: (!) 145/107 (!) 152/105  (!) 130/116  Pulse: (!) 128 (!) 118 (!) 117 (!) 114  Resp: (!) 25 (!) _0 Temp:    100 F (37.8 C)  TempSrc:      SpO2: 99% 100% 100%   Weight:      Height:          Final Clinical Impression(s) / ED Diagnoses Final diagnoses:  Acute on chronic combined systolic and diastolic CHF (congestive heart failure) [facility])    Rx / DC Orders ED Discharge Orders    None       Layla Maw 07/09/20 2358    Fredia Sorrow, MD 07/24/20 1510

## 2020-07-09 NOTE — ED Notes (Signed)
Report attempted to floor.   

## 2020-07-09 NOTE — ED Notes (Signed)
Pt called for vital check multiple times no response

## 2020-07-09 NOTE — H&P (Signed)
History and Physical    AUBURN HESTER WUG:[id number] DOB: [birth year] DOA: 07/09/2020  PCP: Lorelee Market, MD   Patient coming from: Home.   I have personally briefly reviewed patient's old medical records in [place]  Chief Complaint: Shortness of breath.  HPI: Cheryl Mercer is a 34 y.o. 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  She was seen a week ago in the [facility] emergency department by Dr. Gilford Raid due to lower extremity edema, given 40 mg of furosemide IV, labetalol 10 mg IV and 10 units of NovoLog IV as well, then discharged home with prescriptions for her medications, but she has not being able to take the digoxin, Jardiance, did not restart her paroxetine because it makes her feel more depressed and was unable to afford Entresto.  She admits that she uses a lot of salt and has not been watching her fluid intake.  She has been having polyuria, polydipsia, polyphagia and blurry vision.  She denies fever, chills, rhinorrhea, sore throat, wheezing or hemoptysis.  She denies nausea, emesis, diarrhea, constipation, melena or hematochezia.  She denies dysuria, frequency or hematuria.  ED Course: Initial vital signs were temperature 98.9 F, pulse 126, respirations 20, blood pressure 140/116 mmHg O2 sat 100% on room air.  The patient received supplemental oxygen, 10 units of NovoLog SQ and 40 mg of furosemide in the emergency department.  Labs: Her urinalysis was hazy in appearance with  glucosuria more than 500 and proteinuria more than 300 mg/dL.  Influenza and coronavirus 2 PCR negative.  BNP 856.0 pg/mL.  Troponin was 15 and then 14 ng/L.  There is only rare bacteria microscopic examination.  Sodium 131, potassium 4.9, chloride 97 and CO2 25 mmol/L.  Glucose 438 mg/dL.  Renal function is normal.  Calcium is 8.2 mg/dL, but normalizes when corrected to an albumin of 2.7 g/dL.  The rest of the LFTs are also significant for an AST of 79 and alkaline phosphatase of 238 units/L.  Magnesium is 1.6 and phosphorus 3.5 mg/dL.  Imaging: Cardiomegaly and pulmonary vascular congestion without overt pulmonary edema.  There is small right pleural effusion.  Please see images and full radiology report for further detail.  Review of Systems: As per HPI otherwise all other systems reviewed and are negative.  Past Medical History:  Diagnosis Date  . CHF (congestive heart failure) ([place])   . Chronic back pain   . Diabetes mellitus without complication ([place])   . Migraines   . No pertinent past medical history   . Pregnancy induced hypertension    Past Surgical History:  Procedure Laterality Date  . CERVICAL BIOPSY  W/ LOOP ELECTRODE EXCISION  2009  . Laparoscopic tubal sterilization with Falope rings.  2009  . RIGHT/LEFT HEART CATH AND CORONARY ANGIOGRAPHY N/A 03/25/2019   Procedure: RIGHT/LEFT HEART CATH AND CORONARY ANGIOGRAPHY;  Surgeon: Jolaine Artist, MD;  Location: [facility];  Service: Cardiovascular;  Laterality: N/A;  . TUBAL LIGATION     Social History  reports that she has never smoked. She has never used smokeless  tobacco. She reports that she does not drink alcohol and does not use drugs.  Allergies  Allergen Reactions  . Ibuprofen Anaphylaxis   Family History  Problem Relation Age of Onset  . Hypertension Mother   . Anesthesia problems Neg Hx    Prior to Admission medications   Medication Sig Start Date End Date Taking? Authorizing Provider  atorvastatin  (LIPITOR) 40 MG tablet Take 1 tablet (40 mg total) by mouth daily at 6 PM. 07/02/20  Yes Isla Pence, MD  carvedilol (COREG) 6.25 MG tablet Take 1 tablet (6.25 mg total) by mouth 2 (two) times daily with a meal. 07/02/20  Yes Isla Pence, MD  gabapentin (NEURONTIN) 300 MG capsule Take 1 capsule (300 mg total) by mouth 3 (three) times daily. 07/02/20  Yes Isla Pence, MD  insulin aspart (NOVOLOG FLEXPEN) 100 UNIT/ML FlexPen Inject 10 Units into the skin 3 (three) times daily with meals. 07/02/20  Yes Isla Pence, MD  insulin glargine (LANTUS) 100 UNIT/ML Solostar Pen Inject 40 Units into the skin daily. 07/02/20  Yes Isla Pence, MD  metFORMIN (GLUCOPHAGE) 500 MG tablet Take 1 tablet (500 mg total) by mouth 2 (two) times daily with a meal. 07/02/20  Yes Isla Pence, MD  apixaban (ELIQUIS) 5 MG TABS tablet Take 1 tablet (5 mg total) by mouth 2 (two) times daily. Patient not taking: Reported on 07/09/2020 07/02/20   Isla Pence, MD  blood glucose meter kit and supplies Dispense based on patient and insurance preference. Check blood sugar 4 times daily, three times before meals and once before bedtime. (FOR ICD-10 E10.9, E11.9). 07/02/20   Isla Pence, MD  cyclobenzaprine (FLEXERIL) 10 MG tablet Take 10 mg by mouth 3 (three) times daily as needed for muscle spasms. Patient not taking: Reported on 07/09/2020 04/07/19   [provider]  digoxin (LANOXIN) 0.125 MG tablet Take 1 tablet (0.125 mg total) by mouth daily. Patient not taking: Reported on 07/09/2020 07/02/20   Isla Pence, MD  empagliflozin (JARDIANCE) 10 MG TABS tablet Take 1 tablet (10 mg total) by mouth daily. Patient not taking: Reported on 07/09/2020 07/02/20   Isla Pence, MD  furosemide (LASIX) 20 MG tablet Take 1 tablet (20 mg total) by mouth daily. 07/02/20   Isla Pence, MD  PARoxetine (PAXIL) 10 MG tablet Take 1 tablet (10 mg total) by mouth daily. Patient not taking: Reported on  07/09/2020 07/02/20   Isla Pence, MD  rizatriptan (MAXALT) 10 MG tablet Take 1 tablet (10 mg total) by mouth as needed for migraine. May repeat in 2 hours if needed Patient not taking: Reported on 07/09/2020 09/08/19   Max Sane, MD  sacubitril-valsartan (ENTRESTO) 24-26 MG Take 1 tablet by mouth 2 (two) times daily. Patient not taking: Reported on 07/09/2020 07/02/20   Isla Pence, MD  spironolactone (ALDACTONE) 25 MG tablet Take 1 tablet (25 mg total) by mouth daily. Patient not taking: Reported on 07/09/2020 07/02/20   Isla Pence, MD   Physical Exam: Vitals:   07/09/20 2030 07/09/20 2107 07/09/20 2130 07/09/20 2230  BP: (!) 144/108 (!) 131/105 (!) 133/110 (!) 145/107  Pulse: (!) 26 (!) 126 (!) 127 (!) 128  Resp: (!) 23 19 (!) 21 (!) 25  Temp:      TempSrc:      SpO2:  99% 92% 99%  Weight:      Height:       Constitutional: Looks chronically ill. Eyes: PERRL, lids and conjunctivae normal ENMT: Mucous membranes are moist. Posterior pharynx  clear of any exudate or lesions. Neck: normal, supple, no masses, no thyromegaly Respiratory: Mildly tachypneic.  Bibasilar crackles.  No accessory muscle use.  Cardiovascular: Tachycardic in the low 100s, S1-S2-S3 present, no murmurs / rubs / gallops.  3+ lower extremities pitting edema. 2+ pedal pulses. No carotid bruits.  Abdomen: Lower abdominal edema.  Bowel sounds positive, soft, no tenderness, no masses palpated. No hepatosplenomegaly. Musculoskeletal: no clubbing / cyanosis. Good ROM, no contractures. Normal muscle tone.  Skin: no rashes, lesions, ulcers on limited dermatological examination. Neurologic: CN 2-12 grossly intact. Sensation intact, DTR normal. Strength 5/5 in all 4.  Psychiatric: Normal judgment and insight. Alert and oriented x 3. Normal mood.   Labs on Admission: I have personally reviewed following labs and imaging studies  CBC: Recent Labs  Lab 07/09/20 1158  WBC 8.8  HGB 12.1  HCT 38.8  MCV 95.1   PLT 546   Basic Metabolic Panel: Recent Labs  Lab 07/09/20 1158 07/09/20 2101  NA 135 131*  K 3.9 4.9  CL 100 97*  CO2 25 25  GLUCOSE 277* 438*  BUN 10 13  CREATININE 0.62 0.69  CALCIUM 8.3* 8.2*   GFR: Estimated Creatinine Clearance: 85.6 mL/min (by C-G formula based on SCr of 0.69 mg/dL).  Liver Function Tests: No results for input(s): AST, ALT, ALKPHOS, BILITOT, PROT, ALBUMIN in the last 168 hours.  Urine analysis:    Component Value Date/Time   COLORURINE YELLOW 07/09/2020 2101   APPEARANCEUR HAZY (A) 07/09/2020 2101   LABSPEC 1.027 07/09/2020 2101   PHURINE 6.0 07/09/2020 2101   GLUCOSEU >=500 (A) 07/09/2020 2101   HGBUR NEGATIVE 07/09/2020 2101   BILIRUBINUR NEGATIVE 07/09/2020 2101   KETONESUR NEGATIVE 07/09/2020 2101   PROTEINUR >=300 (A) 07/09/2020 2101        NITRITE NEGATIVE 07/09/2020 2101   LEUKOCYTESUR NEGATIVE 07/09/2020 2101   Radiological Exams on Admission: DG Chest 2 View  Result Date: 07/09/2020 CLINICAL DATA:  Shortness of breath.  History of CHF. EXAM: CHEST - 2 VIEW COMPARISON:  CT and chest radiograph July 02, 2020. FINDINGS: Enlarged cardiac silhouette. Pulmonary vascular congestion. No consolidation. Small right pleural effusion. No visible pneumothorax. No acute osseous abnormality. IMPRESSION: 1. Cardiomegaly and pulmonary vascular congestion without overt pulmonary edema. Small right pleural effusion. 2. Please note that airspace opacities seen on recent CT chest were not well visualized by radiograph at that time. Please see recent CT chest for follow-up recommendations. Electronically Signed   By: Margaretha Sheffield MD   On: 07/09/2020 12:17   EKG: Independently reviewed.  Vent. rate 108 BPM PR interval 154 ms QRS duration 84 ms QT/QTc 348/466 ms P-R-T axes 36 -32 123 Sinus tachycardia Left axis deviation Cannot rule out Anterior infarct , age undetermined T wave abnormality, consider lateral ischemia Abnormal  ECG  Assessment/Plan Principal Problem:   Acute on chronic combined systolic and diastolic CHF (HCC) Observation/telemetry. Supplemental oxygen as needed. Fluid and sodium restriction. Furosemide 40 mg IVP twice daily. Begin lisinopril 10 mg p.o. daily. Hold carvedilol till the morning. Monitor daily weights, intake and output. Made emphasis on avoiding salt. Told to season meals with citrus, oregano, garlic pepper, etc. Cardiology evaluation in the morning.  Active Problems:   Uncontrolled type 2 diabetes mellitus with hyperglycemia ([place]) Not taking Jardiance. Resume Lantus 40 units SQ daily. Metformin 500 mg p.o. twice daily. Advised to obtain control to avoid complications. Follow-up with PCP as an outpatient. Vision exam yearly.    Proteinuria Start lisinopril 10  mg p.o. daily. Monitor renal function electrolytes. DM, hypertension and lipids control. Advised to follow-up with PCP and nephrology.    Essential hypertension Resume beta-blocker in a.m. Start lisinopril 10 mg p.o. daily. Currently getting furosemide IV. Monitor BP, HR, renal function electrolytes.    Dyslipidemia Continue atorvastatin 40 mg p.o. daily. Follow-up with PCP.    DVT prophylaxis: Lovenox SQ. Code Status:   Full code. Family Communication: Disposition Plan:   Patient is from:  Home.  Anticipated DC to:  Home.  Anticipated DC date:  07/10/2020 or 07/11/2020.  Anticipated DC barriers: Clinical condition.  Consults called:  Routine cardiology consult in the a.m. Admission status:  Observation/telemetry.  Severity of Illness:  High due to decompensated combined systolic and diastolic CHF with dyspnea, orthopnea, lower extremity edema and gallop on examination despite outpatient treatment for CHF.   Reubin Milan MD [facility]  How to contact the [facility] Attending or Consulting provider [place] or covering provider during after hours [place], for this patient?   1. Check the  care team in [facility] and look for a) attending/consulting [facility] provider listed and b) the [facility] team listed 2. Log into [email or web address] and use [place]'s universal password to access. If you do not have the password, please contact the hospital operator. 3. Locate the [facility] provider you are looking for under [facility] and page to a number that you can be directly reached. 4. If you still have difficulty reaching the provider, please page the [facility] (Director on Call) for the Hospitalists listed on amion for assistance.  07/09/2020, 10:52 PM   This document was prepared using Dragon voice recognition software and may contain some unintended transcription errors.

## 2020-07-09 NOTE — ED Notes (Signed)
Pt went to [facility] earlier today, labs, ekg and cxr completed. Pt LWBS.

## 2020-07-10 ENCOUNTER — Encounter (HOSPITAL_COMMUNITY): Payer: Self-pay | Admitting: Internal Medicine

## 2020-07-10 DIAGNOSIS — J811 Chronic pulmonary edema: Secondary | ICD-10-CM | POA: Diagnosis not present

## 2020-07-10 DIAGNOSIS — I428 Other cardiomyopathies: Secondary | ICD-10-CM | POA: Diagnosis not present

## 2020-07-10 DIAGNOSIS — Z8701 Personal history of pneumonia (recurrent): Secondary | ICD-10-CM | POA: Diagnosis not present

## 2020-07-10 DIAGNOSIS — I110 Hypertensive heart disease with heart failure: Secondary | ICD-10-CM | POA: Diagnosis not present

## 2020-07-10 DIAGNOSIS — T465X6A Underdosing of other antihypertensive drugs, initial encounter: Secondary | ICD-10-CM | POA: Diagnosis not present

## 2020-07-10 DIAGNOSIS — Z7984 Long term (current) use of oral hypoglycemic drugs: Secondary | ICD-10-CM | POA: Diagnosis not present

## 2020-07-10 DIAGNOSIS — Z8616 Personal history of COVID-19: Secondary | ICD-10-CM | POA: Diagnosis not present

## 2020-07-10 DIAGNOSIS — Z91120 Patient's intentional underdosing of medication regimen due to financial hardship: Secondary | ICD-10-CM | POA: Diagnosis not present

## 2020-07-10 DIAGNOSIS — E1169 Type 2 diabetes mellitus with other specified complication: Secondary | ICD-10-CM | POA: Diagnosis not present

## 2020-07-10 DIAGNOSIS — Z8249 Family history of ischemic heart disease and other diseases of the circulatory system: Secondary | ICD-10-CM | POA: Diagnosis not present

## 2020-07-10 DIAGNOSIS — I517 Cardiomegaly: Secondary | ICD-10-CM | POA: Diagnosis not present

## 2020-07-10 DIAGNOSIS — Z86711 Personal history of pulmonary embolism: Secondary | ICD-10-CM | POA: Diagnosis not present

## 2020-07-10 DIAGNOSIS — E1149 Type 2 diabetes mellitus with other diabetic neurological complication: Secondary | ICD-10-CM | POA: Diagnosis not present

## 2020-07-10 DIAGNOSIS — R809 Proteinuria, unspecified: Secondary | ICD-10-CM | POA: Diagnosis not present

## 2020-07-10 DIAGNOSIS — I10 Essential (primary) hypertension: Secondary | ICD-10-CM

## 2020-07-10 DIAGNOSIS — E785 Hyperlipidemia, unspecified: Secondary | ICD-10-CM

## 2020-07-10 DIAGNOSIS — I509 Heart failure, unspecified: Secondary | ICD-10-CM

## 2020-07-10 DIAGNOSIS — R0602 Shortness of breath: Secondary | ICD-10-CM | POA: Diagnosis not present

## 2020-07-10 DIAGNOSIS — E1165 Type 2 diabetes mellitus with hyperglycemia: Secondary | ICD-10-CM | POA: Diagnosis not present

## 2020-07-10 DIAGNOSIS — E782 Mixed hyperlipidemia: Secondary | ICD-10-CM | POA: Diagnosis not present

## 2020-07-10 DIAGNOSIS — J90 Pleural effusion, not elsewhere classified: Secondary | ICD-10-CM | POA: Diagnosis not present

## 2020-07-10 DIAGNOSIS — Z79899 Other long term (current) drug therapy: Secondary | ICD-10-CM | POA: Diagnosis not present

## 2020-07-10 DIAGNOSIS — I5043 Acute on chronic combined systolic (congestive) and diastolic (congestive) heart failure: Secondary | ICD-10-CM | POA: Diagnosis not present

## 2020-07-10 DIAGNOSIS — Z794 Long term (current) use of insulin: Secondary | ICD-10-CM | POA: Diagnosis not present

## 2020-07-10 DIAGNOSIS — Z9111 Patient's noncompliance with dietary regimen: Secondary | ICD-10-CM | POA: Diagnosis not present

## 2020-07-10 LAB — GLUCOSE, CAPILLARY
Glucose-Capillary: 147 mg/dL — ABNORMAL HIGH (ref 70–99)
Glucose-Capillary: 208 mg/dL — ABNORMAL HIGH (ref 70–99)
Glucose-Capillary: 248 mg/dL — ABNORMAL HIGH (ref 70–99)
Glucose-Capillary: 304 mg/dL — ABNORMAL HIGH (ref 70–99)
Glucose-Capillary: 94 mg/dL (ref 70–99)

## 2020-07-10 LAB — LIPID PANEL
Cholesterol: 135 mg/dL (ref 0–200)
HDL: 40 mg/dL — ABNORMAL LOW (ref >40)
LDL Cholesterol: 75 mg/dL (ref 0–99)
Total CHOL/HDL Ratio: 3.4 RATIO
Triglycerides: 100 mg/dL (ref <150)
VLDL: 20 mg/dL (ref 0–40)

## 2020-07-10 LAB — HEMOGLOBIN A1C
Hgb A1c MFr Bld: 13.4 % — ABNORMAL HIGH (ref 4.8–5.6)
Mean Plasma Glucose: 337.88 mg/dL

## 2020-07-10 LAB — BASIC METABOLIC PANEL
Anion gap: 9 (ref 5–15)
BUN: 12 mg/dL (ref 6–20)
CO2: 27 mmol/L (ref 22–32)
Calcium: 8.3 mg/dL — ABNORMAL LOW (ref 8.9–10.3)
Chloride: 98 mmol/L (ref 98–111)
Creatinine, Ser: 0.53 mg/dL (ref 0.44–1.00)
GFR, Estimated: >60 mL/min (ref >60)
Glucose, Bld: 297 mg/dL — ABNORMAL HIGH (ref 70–99)
Potassium: 3.5 mmol/L (ref 3.5–5.1)
Sodium: 134 mmol/L — ABNORMAL LOW (ref 135–145)

## 2020-07-10 MED ORDER — INSULIN ASPART 100 UNIT/ML ~~LOC~~ SOLN: 12.0000 [IU] | Freq: Three times a day (TID) | SUBCUTANEOUS | Status: DC

## 2020-07-10 MED ORDER — LIVING WELL WITH DIABETES BOOK: Freq: Once | Status: AC

## 2020-07-10 MED ORDER — SPIRONOLACTONE 25 MG PO TABS
25.0000 mg | ORAL_TABLET | Freq: Every day | ORAL | Status: DC
  Administered 2020-07-10 – 2020-07-11 (×2): 25 mg via ORAL
  Filled 2020-07-10 (×2): qty 1

## 2020-07-10 MED ORDER — METFORMIN HCL 500 MG PO TABS: 500.0000 mg | ORAL_TABLET | Freq: Two times a day (BID) | ORAL | Status: DC

## 2020-07-10 MED ORDER — MAGNESIUM SULFATE 2 GM/50ML IV SOLN
2.0000 g | Freq: Once | INTRAVENOUS | Status: AC
  Administered 2020-07-10: 2 g via INTRAVENOUS
  Filled 2020-07-10: qty 50

## 2020-07-10 MED ORDER — INSULIN ASPART 100 UNIT/ML ~~LOC~~ SOLN
10.0000 [IU] | Freq: Three times a day (TID) | SUBCUTANEOUS | Status: DC
  Administered 2020-07-10 (×2): 10 [IU] via SUBCUTANEOUS

## 2020-07-10 MED ORDER — GABAPENTIN 300 MG PO CAPS
300.0000 mg | ORAL_CAPSULE | Freq: Three times a day (TID) | ORAL | Status: DC
  Administered 2020-07-10 – 2020-07-11 (×4): 300 mg via ORAL
  Filled 2020-07-10 (×4): qty 1

## 2020-07-10 MED ORDER — INSULIN GLARGINE 100 UNIT/ML ~~LOC~~ SOLN
40.0000 [IU] | Freq: Every day | SUBCUTANEOUS | Status: DC
  Administered 2020-07-10 – 2020-07-11 (×2): 40 [IU] via SUBCUTANEOUS
  Filled 2020-07-10 (×4): qty 0.4

## 2020-07-10 MED ORDER — CARVEDILOL 3.125 MG PO TABS
6.2500 mg | ORAL_TABLET | Freq: Two times a day (BID) | ORAL | Status: DC
  Administered 2020-07-10 – 2020-07-11 (×3): 6.25 mg via ORAL
  Filled 2020-07-10 (×3): qty 2

## 2020-07-10 MED ORDER — ATORVASTATIN CALCIUM 40 MG PO TABS
40.0000 mg | ORAL_TABLET | Freq: Every day | ORAL | Status: DC
  Administered 2020-07-10: 40 mg via ORAL
  Filled 2020-07-10: qty 1

## 2020-07-10 MED ORDER — TRAZODONE HCL 50 MG PO TABS
50.0000 mg | ORAL_TABLET | Freq: Every day | ORAL | Status: DC
  Administered 2020-07-10 (×2): 50 mg via ORAL
  Filled 2020-07-10 (×2): qty 1

## 2020-07-10 MED ORDER — INSULIN ASPART 100 UNIT/ML ~~LOC~~ SOLN
10.0000 [IU] | Freq: Three times a day (TID) | SUBCUTANEOUS | Status: DC
  Administered 2020-07-11 (×2): 10 [IU] via SUBCUTANEOUS

## 2020-07-10 NOTE — TOC Progression Note (Addendum)
Transition of Care [facility]) - Progression Note    Patient Details  Name: Cheryl Mercer MRN: [id number] Date of Birth: [birth year]  Transition of Care [facility]) CM/SW Contact  Annice Needy, LCSW Phone Number: 07/10/2020, 1:17 PM  Clinical Narrative:    TOC consulted d/t patient PCP retiring and patient needing a new PCP. Patient advised that there are two additional PCPs in the practice,  Family Practice, and she plans on seeing one of them.      Expected Discharge Plan: Home/Self Care Barriers to Discharge: Continued Medical Work up  Expected Discharge Plan and Services Expected Discharge Plan: Home/Self Care In-house Referral: Clinical Social Work Discharge Planning Services: NA Post Acute Care Choice: NA Living arrangements for the past 2 months: Mobile Home                                       Social Determinants of Health (SDOH) Interventions    Readmission Risk Interventions No flowsheet data found.

## 2020-07-10 NOTE — Consult Note (Signed)
CARDIOLOGY CONSULT NOTE       Patient ID: Cheryl Mercer MRN: [id number] DOB/AGE: 01-04-1986 34 y.o.  Admit date: 07/09/2020 Referring Physician: Laural Benes Primary Physician: Evelene Croon, MD Primary Cardiologist: Bensimohn Reason for Consultation: CHF  Principal Problem:   Acute on chronic combined systolic and diastolic CHF (congestive heart failure) (HCC) Active Problems:   Uncontrolled type 2 diabetes mellitus with hyperglycemia (HCC)   Proteinuria   Essential hypertension   Dyslipidemia   HPI:  34 y.o. with multiple chronic medical problems. Chronic systolic CHF, COVID Pneumonia March 2021, Pulmonary emboli May 2021. Admitted with non compliance volume overload and LE edema She has poorly controlled DM Right and left cath done 03/25/19 showed no CAD and adequate filling pressures. Echo showed EF 25-30% with only mild MR. On admission A1c 13.4 BNP 856 negative troponin CXR with vascular congestion. Has had good response to Lasix and feels better Long discussion with her about need to be more compliant and take better control of her DM  ROS All other systems reviewed and negative except as noted above  Past Medical History:  Diagnosis Date  . CHF (congestive heart failure) (HCC)   . Chronic back pain   . Chronic combined systolic and diastolic congestive heart failure (HCC) 09/07/2019  . Community acquired pneumonia 12/16/2019  . Diabetes mellitus without complication (HCC)   . History of COVID-19 11/07/2019  . Migraines   . No pertinent past medical history   . Pregnancy induced hypertension   . Pulmonary embolus (HCC) 12/16/2019  . Pulmonary infarct (HCC) 12/17/2019    Family History  Problem Relation Age of Onset  . Hypertension Mother   . Anesthesia problems Neg Hx     Social History   Socioeconomic History  . Marital status: Married    Spouse name: Not on file  . Number of children: 4  . Years of education: Not on file  . Highest education level:  Not on file  Occupational History  . Not on file  Tobacco Use  . Smoking status: Never Smoker  . Smokeless tobacco: Never Used  Vaping Use  . Vaping Use: Never used  Substance and Sexual Activity  . Alcohol use: No    Alcohol/week: 6.0 standard drinks    Types: 6 Shots of liquor per week  . Drug use: No    Types: Marijuana  . Sexual activity: Not Currently    Birth control/protection: Surgical    Comment: band placed around tubes in 2009 surgically by Dr. Emelda Fear  Other Topics Concern  . Not on file  Social History Narrative  . Not on file   Social Determinants of Health   Financial Resource Strain:   . Difficulty of Paying Living Expenses: Not on file  Food Insecurity:   . Worried About Programme researcher, broadcasting/film/video in the Last Year: Not on file  . Ran Out of Food in the Last Year: Not on file  Transportation Needs:   . Lack of Transportation (Medical): Not on file  . Lack of Transportation (Non-Medical): Not on file  Physical Activity:   . Days of Exercise per Week: Not on file  . Minutes of Exercise per Session: Not on file  Stress:   . Feeling of Stress : Not on file  Social Connections:   . Frequency of Communication with Friends and Family: Not on file  . Frequency of Social Gatherings with Friends and Family: Not on file  . Attends Religious Services: Not on file  .  Active Member of Clubs or Organizations: Not on file  . Attends Banker Meetings: Not on file  . Marital Status: Not on file  Intimate Partner Violence:   . Fear of Current or Ex-Partner: Not on file  . Emotionally Abused: Not on file  . Physically Abused: Not on file  . Sexually Abused: Not on file    Past Surgical History:  Procedure Laterality Date  . CERVICAL BIOPSY  W/ LOOP ELECTRODE EXCISION  2009  . Laparoscopic tubal sterilization with Falope rings.  2009  . RIGHT/LEFT HEART CATH AND CORONARY ANGIOGRAPHY N/A 03/25/2019   Procedure: RIGHT/LEFT HEART CATH AND CORONARY ANGIOGRAPHY;   Surgeon: Dolores Patty, MD;  Location: [facility];  Service: Cardiovascular;  Laterality: N/A;  . TUBAL LIGATION        Current Facility-Administered Medications:  .  acetaminophen (TYLENOL) tablet 650 mg, 650 mg, Oral, Q6H PRN, 650 mg at 07/09/20 2241 **OR** acetaminophen (TYLENOL) suppository 650 mg, 650 mg, Rectal, Q6H PRN, Bobette Mo, MD .  atorvastatin (LIPITOR) tablet 40 mg, 40 mg, Oral, q1800, Bobette Mo, MD .  carvedilol (COREG) tablet 6.25 mg, 6.25 mg, Oral, BID WC, Bobette Mo, MD, 6.25 mg at 07/10/20 0835 .  enoxaparin (LOVENOX) injection 40 mg, 40 mg, Subcutaneous, Q24H, Bobette Mo, MD, 40 mg at 07/10/20 0836 .  furosemide (LASIX) injection 40 mg, 40 mg, Intravenous, BID, Bobette Mo, MD, 40 mg at 07/10/20 0835 .  gabapentin (NEURONTIN) capsule 300 mg, 300 mg, Oral, TID, Bobette Mo, MD, 300 mg at 07/10/20 0835 .  insulin aspart (novoLOG) injection 0-15 Units, 0-15 Units, Subcutaneous, TID WC, Bobette Mo, MD, 5 Units at 07/10/20 1015 .  insulin aspart (novoLOG) injection 10 Units, 10 Units, Subcutaneous, TID WC, Johnson, Clanford L, MD, 10 Units at 07/10/20 1016 .  insulin glargine (LANTUS) injection 40 Units, 40 Units, Subcutaneous, Daily, Bobette Mo, MD, 40 Units at 07/10/20 1015 .  lisinopril (ZESTRIL) tablet 10 mg, 10 mg, Oral, Daily, Bobette Mo, MD, 10 mg at 07/10/20 0835 .  ondansetron (ZOFRAN) tablet 4 mg, 4 mg, Oral, Q6H PRN **OR** ondansetron (ZOFRAN) injection 4 mg, 4 mg, Intravenous, Q6H PRN, Bobette Mo, MD .  spironolactone (ALDACTONE) tablet 25 mg, 25 mg, Oral, Daily, Bobette Mo, MD, 25 mg at 07/10/20 0835 .  traZODone (DESYREL) tablet 50 mg, 50 mg, Oral, QHS, Bobette Mo, MD, 50 mg at 07/10/20 0105 . atorvastatin  40 mg Oral q1800  . carvedilol  6.25 mg Oral BID WC  . enoxaparin (LOVENOX) injection  40 mg Subcutaneous Q24H  . furosemide  40 mg  Intravenous BID  . gabapentin  300 mg Oral TID  . insulin aspart  0-15 Units Subcutaneous TID WC  . insulin aspart  10 Units Subcutaneous TID WC  . insulin glargine  40 Units Subcutaneous Daily  . lisinopril  10 mg Oral Daily  . spironolactone  25 mg Oral Daily  . traZODone  50 mg Oral QHS     Physical Exam: Blood pressure 111/70, pulse 85, temperature 97.7 F (36.5 C), temperature source Oral, resp. rate 20, height 5\' 2"  (1.575 m), weight 67.6 kg, last menstrual period 06/30/2020, SpO2 100 %.    Affect appropriate Chronically ill female  HEENT: normal Neck supple with no adenopathy JVP normal no bruits no thyromegaly Lungs clear with no wheezing and good diaphragmatic motion Heart:  S1/S2 S3  no murmur, no rub, gallop  or click PMI enlarged  Abdomen: benighn, BS positve, no tenderness, no AAA no bruit.  No HSM or HJR Distal pulses intact with no bruits Plus one bilateral  edema Neuro non-focal Skin warm and dry M<ultiple tattoos  No muscular weakness   Labs:   Lab Results  Component Value Date   WBC 8.8 07/09/2020   HGB 12.1 07/09/2020   HCT 38.8 07/09/2020   MCV 95.1 07/09/2020   PLT 345 07/09/2020    Recent Labs  Lab 07/09/20 2101 07/09/20 2301 07/10/20 0524  NA   < >  --  134*  K   < >  --  3.5  CL   < >  --  98  CO2   < >  --  27  BUN   < >  --  12  CREATININE   < >  --  0.53  CALCIUM   < >  --  8.3*  PROT  --  7.5  --   BILITOT  --  0.4  --   ALKPHOS  --  238*  --   ALT  --  29  --   AST  --  79*  --   GLUCOSE   < >  --  297*   < > = values in this interval not displayed.   Lab Results  Component Value Date   CKTOTAL 116 12/16/2019    Lab Results  Component Value Date   CHOL 135 07/10/2020   CHOL 151 03/24/2019   Lab Results  Component Value Date   HDL 40 (L) 07/10/2020   HDL 38 (L) 03/24/2019   Lab Results  Component Value Date   LDLCALC 75 07/10/2020   LDLCALC 95 03/24/2019   Lab Results  Component Value Date   TRIG 100  07/10/2020   TRIG 92 03/24/2019   Lab Results  Component Value Date   CHOLHDL 3.4 07/10/2020   CHOLHDL 4.0 03/24/2019   No results found for: LDLDIRECT    Radiology: DG Chest 2 View  Result Date: 07/09/2020 CLINICAL DATA:  Shortness of breath.  History of CHF. EXAM: CHEST - 2 VIEW COMPARISON:  CT and chest radiograph July 02, 2020. FINDINGS: Enlarged cardiac silhouette. Pulmonary vascular congestion. No consolidation. Small right pleural effusion. No visible pneumothorax. No acute osseous abnormality. IMPRESSION: 1. Cardiomegaly and pulmonary vascular congestion without overt pulmonary edema. Small right pleural effusion. 2. Please note that airspace opacities seen on recent CT chest were not well visualized by radiograph at that time. Please see recent CT chest for follow-up recommendations. Electronically Signed   By: Feliberto Harts MD   On: 07/09/2020 12:17   CT Angio Chest PE W and/or Wo Contrast  Result Date: 07/02/2020 CLINICAL DATA:  Shortness of breath.  Lower extremity edema EXAM: CT ANGIOGRAPHY CHEST WITH CONTRAST TECHNIQUE: Multidetector CT imaging of the chest was performed using the standard protocol during bolus administration of intravenous contrast. Multiplanar CT image reconstructions and MIPs were obtained to evaluate the vascular anatomy. CONTRAST:  OMNIPAQUE IOHEXOL 350 MG/ML SOLN COMPARISON:  CT angiogram chest December 16, 2019; chest radiograph July 02, 2020 FINDINGS: Cardiovascular: No demonstrable pulmonary emboli evident. No evident thoracic aortic aneurysm. No dissection evident. The contrast bolus in the aorta is not sufficient for confident dissection assessment. Visualized great vessels appear unremarkable. Note that the right innominate and left common carotid arteries arise as a common trunk, an anatomic variant. No appreciable pericardial effusion or pericardial thickening. Heart is enlarged. There  is again noted prominence of the main pulmonary  outflow tract measuring 3.3 cm consistent with a degree of pulmonary arterial hypertension. Mediastinum/Nodes: Visualized thyroid appears normal. There are subcentimeter mediastinal lymph nodes without evident adenopathy by size criteria. No esophageal lesions are evident. Lungs/Pleura: There are scattered nodular appearing opacities in the lungs bilaterally, multifocal in location but most notable in the lower lobes. There also areas of airspace opacity in the inferior lingula and both lower lobe regions without significant consolidation. No pleural effusions are evident. Upper Abdomen: Reflux of contrast is noted into the inferior vena cava and hepatic veins. Visualized upper abdominal structures otherwise appear unremarkable. Musculoskeletal: No blastic or lytic bone lesions are evident. There are no chest wall lesions. Review of the MIP images confirms the above findings. IMPRESSION: 1. No evident pulmonary embolus. No thoracic aortic aneurysm. No dissection seen with contrast bolus in the aorta is less than optimal for potential dissection assessment. 2. Foci of airspace opacity throughout the lungs, most notably in the bilateral lower lobe and lingular regions. Several of these areas appear somewhat nodular without cavitation. The overall appearance is consistent with atypical organism pneumonia. In this regard, advise correlation with COVID-19 status. Nodular opacities of this nature conceivably could have neoplastic etiology, although atypical organism pneumonia is felt to be more likely. Given this circumstance, repeat CT in approximately 4-6 weeks after treatment for presumed infectious etiology advised. 3.  No adenopathy evident by size criteria. 4. Prominence of the main pulmonary outflow tract, a finding felt to be indicative of pulmonary arterial hypertension. Heart enlarged. 5. Reflux of contrast into the inferior vena cava and hepatic veins is felt to be indicative of increased right heart pressure.  Electronically Signed   By: Bretta Bang III M.D.   On: 07/02/2020 10:09   DG Chest Port 1 View  Result Date: 07/02/2020 CLINICAL DATA:  Shortness of breath. EXAM: PORTABLE CHEST 1 VIEW COMPARISON:  Chest radiograph 12/17/2019. FINDINGS: Unchanged cardiomegaly. A rounded opacity projecting in the region of the lateral left lung base is favored to reflect a nipple shadow. No appreciable airspace consolidation or pulmonary edema. No evidence of pleural effusion or pneumothorax. No acute bony abnormality identified. IMPRESSION: A rounded opacity projecting in the region of the lateral left lung base is favored to reflect a nipple shadow. However, attention is recommended on the pending CT angiogram chest to exclude a pulmonary nodule at this site. Unchanged cardiomegaly. No airspace consolidation or pulmonary edema. Electronically Signed   By: Jackey Loge DO   On: 07/02/2020 08:17    EKG: SR no acute changes    ASSESSMENT AND PLAN:   1. CHF:  Chronic follows with DB in CHF clinic No CAD by cath 03/25/19 Rx complicated by non compliance Continue iv lasix 40 mg bid today. Continue aldactone. She is on ACE due to cost/compliance issues. Will likely need another 48 hours before d/c   2. DM:  Major issue with poor control continue insulin dietary consult while in house at risk for long term complications such as neuropathy, renal failure and retinopathy   Signed: Charlton Haws 07/10/2020, 11:37 AM

## 2020-07-10 NOTE — Plan of Care (Signed)
Nutrition Education Note  RD consulted for nutrition education regarding CHF.  RD provided "Low Sodium Nutrition Therapy" handout from the Academy of [employer]. Reviewed patient's dietary recall. Provided examples on ways to decrease sodium intake in diet. Discouraged intake of processed foods and use of salt shaker. Encouraged fresh fruits and vegetables as well as whole grain sources of carbohydrates to maximize fiber intake.   RD discussed why it is important for patient to adhere to diet recommendations, and emphasized the role of fluids, foods to avoid, and importance of weighing self daily. Teach back method used.  Expect good compliance.  Body mass index is 27.26 kg/m. Pt meets criteria for overweight based on current BMI.  Current diet order is , patient is consuming approximately 75% of meals at this time. Labs and medications reviewed. No further nutrition interventions warranted at this time. RD contact information provided. If additional nutrition issues arise, please re-consult RD.   Lars Masson, RD, LDN Clinical Nutrition After Hours/Weekend Pager # in Amion

## 2020-07-10 NOTE — Progress Notes (Signed)
Inpatient Diabetes Program Recommendations  AACE/ADA: New Consensus Statement on Inpatient Glycemic Control (2015)  Target Ranges:  Prepandial:   less than 140 mg/dL      Peak postprandial:   less than 180 mg/dL (1-2 hours)      Critically ill patients:  140 - 180 mg/dL   Lab Results  Component Value Date   GLUCAP 248 (H) 07/10/2020   HGBA1C 13.4 (H) 07/09/2020    Review of Glycemic Control Results for Cheryl Mercer, Cheryl Mercer (MRN [id number]) as of 07/10/2020 10:45  Ref. Range 07/02/2020 12:54 07/09/2020 15:09 07/09/2020 20:52 07/10/2020 00:21 07/10/2020 07:54  Glucose-Capillary Latest Ref Range: 70 - 99 mg/dL 578 (H) 469 (H) 629 (H) 208 (H) 248 (H)   Diabetes history:  T2DM Outpatient Diabetes medications:  Lantus 40 units qhs Novolog 10 units tid with meals Metformin 500 mg bid Jardiance 10 mg daily (has not started yet) Current orders for Inpatient glycemic control:  Lantus 40 units qhs Novolog 0-15 tid and 0-5 qhs  Note:  Spoke with patient over the phone. Patient confirms above home medications.  She was prescribed Jardiance 10 mg daily a few months ago but did not pick up prescription as she was without insurance for a short time.  She currently has Medicaid.  Encouraged her to pick up Jardiance.   Explained that it helps lower A1C as well as being cardiac protective.  Reviewed patient's current A1c of 13.4% (average blood sugar of 338 mg/dL) . Explained what a A1c is and what it measures. Also reviewed goal A1c with patient, importance of good glucose control @ home, and blood sugar goals. Explained risks of long and short term complications.    She rotates her insulin administration sites appropriately.  She states she needs more insulin pen needles.  MD please order [phone] at discharge.  Discussed importance of diet and exercise.  Also reviewed The Plate Method.  She admits to not eating well and drinking beverages with sugar.  Ordered Living Well with Diabetes booklet.     She states she was current with Dr. Glenis Smoker but he has retired.  She would like assistance with finding a new PCP.  Placed TOC consult.    Will continue to follow while inpatient.  Thank you, Dulce Sellar, RN, BSN Diabetes Coordinator Inpatient Diabetes Program [phone] (team pager from 8a-5p)

## 2020-07-10 NOTE — Progress Notes (Signed)
PROGRESS NOTE   Cheryl Mercer  ZOX:[id number] DOB: 11-30-1985 DOA: 07/09/2020 PCP: Evelene Croon, MD   Chief Complaint  Patient presents with  . Leg Swelling    Brief Admission History:  34 y.o. 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  She was seen a week ago in the [facility] emergency department by Dr. Particia Nearing due to lower extremity edema, given 40 mg of furosemide IV, labetalol 10 mg IV and 10 units of NovoLog IV as well, then discharged home with prescriptions for her medications, but she has not being able to take the digoxin, Jardiance, did not restart her paroxetine because it makes her feel more depressed and was unable to afford Entresto.  She admits that she uses a lot of salt and has not been watching her fluid intake.  She has been having polyuria, polydipsia, polyphagia and blurry vision.  She denies fever, chills, rhinorrhea, sore throat, wheezing or hemoptysis.  She denies nausea, emesis, diarrhea, constipation, melena or hematochezia.  She denies dysuria, frequency or hematuria. Pt was admitted with acute CHF exacerbation and volume overload exacerbated by poor compliance with medication and diet.    Assessment & Plan:   Principal Problem:   Acute on chronic combined systolic and diastolic CHF (congestive heart failure) (HCC) Active Problems:   Uncontrolled type 2 diabetes mellitus with hyperglycemia (HCC)   Proteinuria   Essential hypertension   Dyslipidemia   1. Acute combined  systolic and diastolic heart failure - exacerbated by poor diet and poor compliance with taking medictions. She is markedly volume overloaded. Continue IV lasix. Appreciate cardiology recommendations.  They endorse at least 1-2 more days of IV diuresis with lasix.  Monitor lytes closely.  Consult to dietitian for diet recommendations.  She was not able to do Entresto due to cost/compliance and now is back on lisinopril.  She reports she has insurance now and more able to take her medications.  Social worker consulted to assist.  2. 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  She recently was able to get her home insulin doses filled. She will need Rx for insulin pen needles at discharge.  Diabetes coordinator has counseled with patient. Continue basal bolus insulin with supplemental sliding scale coverage and frequent CBG monitoring.  Unfortunately at this time patient remains HIGH RISK for acute and chronic complications of poorly controlled diabetes mellitus.   3. Essential hypertension -continue lisinopril and carvedilol as ordered.  Titrate doses as necessary.  4. Diabetic dyslipidemia - unfortunately lipids will not improve until glycemic control is achieved.  Ok to continue atorvastatin for now.    5. Proteinuria - secondary to poorly controlled diabetes mellitus and hypertension - Pt started on lisinopril 10 mg daily.    DVT prophylaxis:  enoxaparin Code Status: full  Family Communication:  Disposition:   Status is: Observation  The patient will require care spanning > 2 midnights and should be moved to inpatient because: IV treatments appropriate due to intensity of illness or inability to take PO and Inpatient level of  care appropriate due to severity of illness  Dispo:  Patient From: Home  Planned Disposition: Home with Health Care Svc  Expected discharge date: 07/13/20  Medically stable for discharge: No  Consultants:    cardiology  Procedures:     Antimicrobials:     Subjective: Pt reporting that the edema in her legs is improving but not resolved.  No chest pain today.   Objective: Vitals:   07/10/20 0413 07/10/20 0745 07/10/20 1124 07/10/20 1411  BP: 123/80 121/90 111/70 106/67  Pulse: (!) 104 100 85 99  Resp: 18 20 20 20   Temp: 97.8 F (36.6 C) (!) 97.5 F (36.4 C) 97.7 F (36.5 C) 98.5 F (36.9 C)  TempSrc: Oral Oral Oral Oral  SpO2: 97% 100% 100% 99%  Weight: 67.6 kg     Height:        Intake/Output Summary (Last 24 hours) at 07/10/2020 1638 Last data filed at 07/10/2020 1500 Gross per 24 hour  Intake 1080 ml  Output --  Net 1080 ml   Filed Weights   07/09/20 1931 07/10/20 0413  Weight: 60.3 kg 67.6 kg    Examination:  General exam: Appears calm and comfortable  Respiratory system: Clear to auscultation. Respiratory effort normal. Cardiovascular system: normal S1 & S2 heard. No JVD, murmurs, rubs, gallops or clicks. No pedal edema. Gastrointestinal system: Abdomen is nondistended, soft and nontender. No organomegaly or masses felt. Normal bowel sounds heard. Central nervous system: Alert and oriented. No focal neurological deficits. Extremities: 2+ edema BLEs from feet to mid thigh.  Symmetric 5 x 5 power. Skin: No rashes, lesions or ulcers Psychiatry: Judgement and insight appear normal. Mood & affect appropriate.   Data Reviewed: I have personally reviewed following labs and imaging studies  CBC: Recent Labs  Lab 07/09/20 1158  WBC 8.8  HGB 12.1  HCT 38.8  MCV 95.1  PLT 345    Basic Metabolic Panel: Recent Labs  Lab 07/09/20 1158 07/09/20 2101 07/09/20 2301 07/10/20 0524  NA 135 131*  --  134*  K 3.9 4.9  --  3.5  CL 100 97*  --  98  CO2 25 25  --  27  GLUCOSE 277* 438*  --  297*  BUN 10 13  --  12  CREATININE 0.62 0.69  --  0.53  CALCIUM 8.3* 8.2*  --  8.3*  MG  --   --  1.6*  --   PHOS  --   --  3.5  --     GFR: Estimated Creatinine  Clearance: 90.2 mL/min (by C-G formula based on SCr of 0.53 mg/dL).  Liver Function Tests: Recent Labs  Lab 07/09/20 2301  AST 79*  ALT 29  ALKPHOS 238*  BILITOT 0.4  PROT 7.5  ALBUMIN 2.7*    CBG: Recent Labs  Lab 07/09/20 2052 07/10/20 0021 07/10/20 0754 07/10/20 1121 07/10/20 1617  GLUCAP 411* 208* 248* 304* 94    Recent Results (from the past 240 hour(s))  Respiratory Panel by RT PCR (Flu A&B, Covid) - Nasopharyngeal Swab     Status: None   Collection Time: 07/02/20  7:55 AM   Specimen: Nasopharyngeal Swab  Result Value Ref Range Status   SARS Coronavirus 2 by RT PCR NEGATIVE NEGATIVE Final    Comment: (NOTE) SARS-CoV-2 target nucleic acids are NOT DETECTED.  The SARS-CoV-2 RNA is generally detectable in upper respiratoy specimens during the acute phase of infection. The lowest concentration of SARS-CoV-2 viral copies this assay can detect  is 131 copies/mL. A negative result does not preclude SARS-Cov-2 infection and should not be used as the sole basis for treatment or other patient management decisions. A negative result may occur with  improper specimen collection/handling, submission of specimen other than nasopharyngeal swab, presence of viral mutation(s) within the areas targeted by this assay, and inadequate number of viral copies (<131 copies/mL). A negative result must be combined with clinical observations, patient history, and epidemiological information. The expected result is Negative.  Fact Sheet for Patients:  https://www.moore.com/  Fact Sheet for Healthcare Providers:  https://www.young.biz/  This test is no t yet approved or cleared by the [place] FDA and  has been authorized for detection and/or diagnosis of SARS-CoV-2 by FDA under an Emergency Use Authorization (EUA). This EUA will remain  in effect (meaning this test can be used) for the duration of the COVID-19 declaration under Section  564(b)(1) of the Act, 21 U.S.C. section 360bbb-3(b)(1), unless the authorization is terminated or revoked sooner.     Influenza A by PCR NEGATIVE NEGATIVE Final   Influenza B by PCR NEGATIVE NEGATIVE Final    Comment: (NOTE) The Xpert Xpress SARS-CoV-2/FLU/RSV assay is intended as an aid in  the diagnosis of influenza from Nasopharyngeal swab specimens and  should not be used as a sole basis for treatment. Nasal washings and  aspirates are unacceptable for Xpert Xpress SARS-CoV-2/FLU/RSV  testing.  Fact Sheet for Patients: https://www.moore.com/  Fact Sheet for Healthcare Providers: https://www.young.biz/  This test is not yet approved or cleared by the [place] FDA and  has been authorized for detection and/or diagnosis of SARS-CoV-2 by  FDA under an Emergency Use Authorization (EUA). This EUA will remain  in effect (meaning this test can be used) for the duration of the  Covid-19 declaration under Section 564(b)(1) of the Act, 21  U.S.C. section 360bbb-3(b)(1), unless the authorization is  terminated or revoked. Performed at [facility], [address], [place], [place] [address]   Resp Panel by RT-PCR (Flu A&B, Covid) Nasopharyngeal Swab     Status: None   Collection Time: 07/09/20  9:04 PM   Specimen: Nasopharyngeal Swab; Nasopharyngeal(NP) swabs in vial transport medium  Result Value Ref Range Status   SARS Coronavirus 2 by RT PCR NEGATIVE NEGATIVE Final    Comment: (NOTE) SARS-CoV-2 target nucleic acids are NOT DETECTED.  The SARS-CoV-2 RNA is generally detectable in upper respiratory specimens during the acute phase of infection. The lowest concentration of SARS-CoV-2 viral copies this assay can detect is 138 copies/mL. A negative result does not preclude SARS-Cov-2 infection and should not be used as the sole basis for treatment or other patient management decisions. A negative result may occur with  improper specimen  collection/handling, submission of specimen other than nasopharyngeal swab, presence of viral mutation(s) within the areas targeted by this assay, and inadequate number of viral copies(<138 copies/mL). A negative result must be combined with clinical observations, patient history, and epidemiological information. The expected result is Negative.  Fact Sheet for Patients:  BloggerCourse.com  Fact Sheet for Healthcare Providers:  SeriousBroker.it  This test is no t yet approved or cleared by the [place] FDA and  has been authorized for detection and/or diagnosis of SARS-CoV-2 by FDA under an Emergency Use Authorization (EUA). This EUA will remain  in effect (meaning this test can be used) for the duration of the COVID-19 declaration under Section 564(b)(1) of the Act, 21 U.S.C.section 360bbb-3(b)(1), unless the authorization is terminated  or revoked sooner.  Influenza A by PCR NEGATIVE NEGATIVE Final   Influenza B by PCR NEGATIVE NEGATIVE Final    Comment: (NOTE) The Xpert Xpress SARS-CoV-2/FLU/RSV plus assay is intended as an aid in the diagnosis of influenza from Nasopharyngeal swab specimens and should not be used as a sole basis for treatment. Nasal washings and aspirates are unacceptable for Xpert Xpress SARS-CoV-2/FLU/RSV testing.  Fact Sheet for Patients: BloggerCourse.com  Fact Sheet for Healthcare Providers: SeriousBroker.it  This test is not yet approved or cleared by the [place] FDA and has been authorized for detection and/or diagnosis of SARS-CoV-2 by FDA under an Emergency Use Authorization (EUA). This EUA will remain in effect (meaning this test can be used) for the duration of the COVID-19 declaration under Section 564(b)(1) of the Act, 21 U.S.C. section 360bbb-3(b)(1), unless the authorization is terminated or revoked.  Performed at [facility], [address] [place], [place] [address]      Radiology Studies: DG Chest 2 View  Result Date: 07/09/2020 CLINICAL DATA:  Shortness of breath.  History of CHF. EXAM: CHEST - 2 VIEW COMPARISON:  CT and chest radiograph July 02, 2020. FINDINGS: Enlarged cardiac silhouette. Pulmonary vascular congestion. No consolidation. Small right pleural effusion. No visible pneumothorax. No acute osseous abnormality. IMPRESSION: 1. Cardiomegaly and pulmonary vascular congestion without overt pulmonary edema. Small right pleural effusion. 2. Please note that airspace opacities seen on recent CT chest were not well visualized by radiograph at that time. Please see recent CT chest for follow-up recommendations. Electronically Signed   By: Feliberto Harts MD   On: 07/09/2020 12:17   Scheduled Meds: . atorvastatin  40 mg Oral q1800  . carvedilol  6.25 mg Oral BID WC  . enoxaparin (LOVENOX) injection  40 mg Subcutaneous Q24H  . furosemide  40 mg Intravenous BID  . gabapentin  300 mg Oral TID  . insulin aspart  0-15 Units Subcutaneous TID WC  . insulin aspart  12 Units Subcutaneous TID WC  . insulin glargine  40 Units Subcutaneous Daily  . lisinopril  10 mg Oral Daily  . spironolactone  25 mg Oral Daily  . traZODone  50 mg Oral QHS   Continuous Infusions:   LOS: 0 days   Time spent: 38 mins  Dantonio Justen Laural Benes, MD How to contact the [facility] Attending or Consulting provider 7A - 7P or covering provider during after hours 7P -7A, for this patient?  1. Check the care team in [facility] and look for a) attending/consulting [facility] provider listed and b) the [facility] team listed 2. Log into [email or web address] and use 's universal password to access. If you do not have the password, please contact the hospital operator. 3. Locate the [facility] provider you are looking for under [facility] and page to a number that you can be directly reached. 4. If you still have difficulty reaching the provider, please page  the [facility] (Director on Call) for the Hospitalists listed on amion for assistance.  07/10/2020, 4:38 PM

## 2020-07-11 DIAGNOSIS — E1169 Type 2 diabetes mellitus with other specified complication: Secondary | ICD-10-CM | POA: Diagnosis not present

## 2020-07-11 DIAGNOSIS — I10 Essential (primary) hypertension: Secondary | ICD-10-CM | POA: Diagnosis not present

## 2020-07-11 DIAGNOSIS — E1165 Type 2 diabetes mellitus with hyperglycemia: Secondary | ICD-10-CM | POA: Diagnosis not present

## 2020-07-11 DIAGNOSIS — E785 Hyperlipidemia, unspecified: Secondary | ICD-10-CM | POA: Diagnosis not present

## 2020-07-11 DIAGNOSIS — I5043 Acute on chronic combined systolic (congestive) and diastolic (congestive) heart failure: Secondary | ICD-10-CM | POA: Diagnosis not present

## 2020-07-11 LAB — BASIC METABOLIC PANEL
Anion gap: 7 (ref 5–15)
BUN: 13 mg/dL (ref 6–20)
CO2: 27 mmol/L (ref 22–32)
Calcium: 8 mg/dL — ABNORMAL LOW (ref 8.9–10.3)
Chloride: 100 mmol/L (ref 98–111)
Creatinine, Ser: 0.64 mg/dL (ref 0.44–1.00)
GFR, Estimated: >60 mL/min (ref >60)
Glucose, Bld: 277 mg/dL — ABNORMAL HIGH (ref 70–99)
Potassium: 3.8 mmol/L (ref 3.5–5.1)
Sodium: 134 mmol/L — ABNORMAL LOW (ref 135–145)

## 2020-07-11 LAB — CBC
HCT: 35.4 % — ABNORMAL LOW (ref 36.0–46.0)
Hemoglobin: 11.4 g/dL — ABNORMAL LOW (ref 12.0–15.0)
MCH: 30.6 pg (ref 26.0–34.0)
MCHC: 32.2 g/dL (ref 30.0–36.0)
MCV: 94.9 fL (ref 80.0–100.0)
Platelets: 321 10*3/uL (ref 150–400)
RBC: 3.73 MIL/uL — ABNORMAL LOW (ref 3.87–5.11)
RDW: 13.2 % (ref 11.5–15.5)
WBC: 7.9 10*3/uL (ref 4.0–10.5)
nRBC: 0 % (ref 0.0–0.2)

## 2020-07-11 LAB — MAGNESIUM: Magnesium: 1.6 mg/dL — ABNORMAL LOW (ref 1.7–2.4)

## 2020-07-11 LAB — GLUCOSE, CAPILLARY
Glucose-Capillary: 242 mg/dL — ABNORMAL HIGH (ref 70–99)
Glucose-Capillary: 253 mg/dL — ABNORMAL HIGH (ref 70–99)
Glucose-Capillary: 228 mg/dL — ABNORMAL HIGH (ref 70–99)
Glucose-Capillary: 207 mg/dL — ABNORMAL HIGH (ref 70–99)

## 2020-07-11 MED ORDER — FUROSEMIDE 20 MG PO TABS
20.0000 mg | ORAL_TABLET | Freq: Every day | ORAL | Status: DC
  Administered 2020-07-11: 20 mg via ORAL
  Filled 2020-07-11: qty 1

## 2020-07-11 MED ORDER — FUROSEMIDE 20 MG PO TABS
20.0000 mg | ORAL_TABLET | Freq: Every day | ORAL | 1 refills | Status: DC
Start: 2020-07-11 — End: 2020-07-24

## 2020-07-11 MED ORDER — CARVEDILOL 6.25 MG PO TABS: 6.2500 mg | ORAL_TABLET | Freq: Two times a day (BID) | ORAL | 1 refills | Status: DC

## 2020-07-11 MED ORDER — NOVOLOG FLEXPEN 100 UNIT/ML ~~LOC~~ SOPN: 10.0000 [IU] | PEN_INJECTOR | Freq: Three times a day (TID) | SUBCUTANEOUS | 1 refills | Status: DC

## 2020-07-11 MED ORDER — LISINOPRIL 10 MG PO TABS
10.0000 mg | ORAL_TABLET | Freq: Every day | ORAL | 1 refills | Status: DC
Start: 2020-07-12 — End: 2020-07-24

## 2020-07-11 MED ORDER — SPIRONOLACTONE 25 MG PO TABS
25.0000 mg | ORAL_TABLET | Freq: Every day | ORAL | 1 refills | Status: DC
Start: 2020-07-12 — End: 2021-06-15

## 2020-07-11 NOTE — Discharge Summary (Signed)
Physician Discharge Summary  Cheryl Mercer ZOX:[id number] DOB: Jul 28, 1986 DOA: 07/09/2020  PCP: Lorelee Market, MD  Admit date: 07/09/2020 Discharge date: 07/11/2020  Admitted From: Home Disposition:  Home   Recommendations for Outpatient Follow-up:  1. Follow up with PCP in 1-2 weeks 2. 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 She was seen a week ago in the [facility] emergency department by Dr. Gilford Raid due to lower extremity edema, given 40 mg of furosemide IV, labetalol 10 mg IV and 10 units of NovoLog IV as well, then discharged home with prescriptions for her medications, but she has not being able to take the digoxin, Jardiance, did not restart her paroxetine because it makes her feel more depressed and was unable to afford Entresto. She admits that she uses a lot of salt and has not been watching her fluid intake. She has been having polyuria, polydipsia, polyphagia and blurry vision. She denies fever, chills, rhinorrhea, sore throat, wheezing or hemoptysis. She denies nausea, emesis, diarrhea, constipation, melena or hematochezia. She denies dysuria, frequency or hematuria. Pt was admitted with acute CHF exacerbation and volume overload exacerbated by poor compliance with medication and diet.    Discharge Diagnoses:   1. Acute combined systolic and diastolic heart failure - exacerbated by poor diet and poor compliance with taking medictions. She is markedly volume overloaded. Continue IV lasix. Appreciate cardiology recommendations. Consult to dietitian for diet recommendations.  She was not able to do Entresto due to cost/compliance and now is back on lisinopril.  She reports she has insurance now and more able to take her medications.  Social worker consulted to assist.  Discharge home on lasix 20 mg daily, coreg 6.25 mg bid, lisinopril 10 mg daily.  Spironolactone 25 mg bid.  Cleared for d/c by cardiology 11/24.  12/20/19 Echo--EF 81-19%, + diastolic, mod decrease RV function  2. 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  She recently was able to get her home insulin doses filled. She will need Rx for insulin pen needles at discharge.  Diabetes coordinator has counseled with patient. Continue basal bolus insulin with supplemental sliding scale coverage and frequent CBG monitoring.  d/c home with lantus 40 units daily and novolog 10 units with meals 3. Essential hypertension -continue lisinopril and carvedilol as ordered.  Titrate doses as necessary.  4. dyslipidemia -  Ok to continue atorvastatin for now.    5. Proteinuria - secondary to poorly controlled diabetes mellitus and hypertension - Pt started on lisinopril 10 mg daily.   Discharge Instructions   Allergies as of 07/11/2020      Reactions   Ibuprofen Anaphylaxis      Medication List    STOP taking these medications   apixaban 5 MG Tabs tablet Commonly known as: ELIQUIS   cyclobenzaprine 10 MG tablet Commonly known as: FLEXERIL   digoxin 0.125 MG tablet Commonly known as: LANOXIN  empagliflozin 10 MG Tabs tablet Commonly known as: JARDIANCE   PARoxetine 10 MG tablet Commonly known as: PAXIL   rizatriptan 10 MG tablet Commonly known as: MAXALT   sacubitril-valsartan 24-26  MG Commonly known as: ENTRESTO     TAKE these medications   atorvastatin 40 MG tablet Commonly known as: LIPITOR Take 1 tablet (40 mg total) by mouth daily at 6 PM.   blood glucose meter kit and supplies Dispense based on patient and insurance preference. Check blood sugar 4 times daily, three times before meals and once before bedtime. (FOR ICD-10 E10.9, E11.9).   carvedilol 6.25 MG tablet Commonly known as: COREG Take 1 tablet (6.25 mg total) by mouth 2 (two) times daily with a meal.   furosemide 20 MG tablet Commonly known as: LASIX Take 1 tablet (20 mg total) by mouth daily.   gabapentin 300 MG capsule Commonly known as: NEURONTIN Take 1 capsule (300 mg total) by mouth 3 (three) times daily.   insulin glargine 100 UNIT/ML Solostar Pen Commonly known as: LANTUS Inject 40 Units into the skin daily.   lisinopril 10 MG tablet Commonly known as: ZESTRIL Take 1 tablet (10 mg total) by mouth daily. Start taking on: July 12, 2020   metFORMIN 500 MG tablet Commonly known as: GLUCOPHAGE Take 1 tablet (500 mg total) by mouth 2 (two) times daily with a meal.   NovoLOG FlexPen 100 UNIT/ML FlexPen Generic drug: insulin aspart Inject 10 Units into the skin 3 (three) times daily with meals.   spironolactone 25 MG tablet Commonly known as: ALDACTONE Take 1 tablet (25 mg total) by mouth daily. Start taking on: July 12, 2020       Allergies  Allergen Reactions  . Ibuprofen Anaphylaxis    Consultations:  cardiology   Procedures/Studies: DG Chest 2 View  Result Date: 07/09/2020 CLINICAL DATA:  Shortness of breath.  History of CHF. EXAM: CHEST - 2 VIEW COMPARISON:  CT and chest radiograph July 02, 2020. FINDINGS: Enlarged cardiac silhouette. Pulmonary vascular congestion. No consolidation. Small right pleural effusion. No visible pneumothorax. No acute osseous abnormality. IMPRESSION: 1. Cardiomegaly and pulmonary vascular congestion without overt pulmonary  edema. Small right pleural effusion. 2. Please note that airspace opacities seen on recent CT chest were not well visualized by radiograph at that time. Please see recent CT chest for follow-up recommendations. Electronically Signed   By: Margaretha Sheffield MD   On: 07/09/2020 12:17   CT Angio Chest PE W and/or Wo Contrast  Result Date: 07/02/2020 CLINICAL DATA:  Shortness of breath.  Lower extremity edema EXAM: CT ANGIOGRAPHY CHEST WITH CONTRAST TECHNIQUE: Multidetector CT imaging of the chest was performed using the standard protocol during bolus administration of intravenous contrast. Multiplanar CT image reconstructions and MIPs were obtained to evaluate the vascular anatomy. CONTRAST:  123m OMNIPAQUE IOHEXOL 350 MG/ML SOLN COMPARISON:  CT angiogram chest December 16, 2019; chest radiograph July 02, 2020 FINDINGS: Cardiovascular: No demonstrable pulmonary emboli evident. No evident thoracic aortic aneurysm. No dissection evident. The contrast bolus in the aorta is not sufficient for confident dissection assessment. Visualized great vessels appear unremarkable. Note that the right innominate and left common carotid arteries arise as a common trunk, an anatomic variant. No appreciable pericardial effusion or pericardial thickening. Heart is enlarged. There is again noted prominence of the main pulmonary outflow tract measuring 3.3 cm consistent with a degree of pulmonary arterial hypertension. Mediastinum/Nodes: Visualized thyroid appears normal. There are subcentimeter mediastinal lymph nodes without evident adenopathy by size criteria.  No esophageal lesions are evident. Lungs/Pleura: There are scattered nodular appearing opacities in the lungs bilaterally, multifocal in location but most notable in the lower lobes. There also areas of airspace opacity in the inferior lingula and both lower lobe regions without significant consolidation. No pleural effusions are evident. Upper Abdomen: Reflux of contrast is  noted into the inferior vena cava and hepatic veins. Visualized upper abdominal structures otherwise appear unremarkable. Musculoskeletal: No blastic or lytic bone lesions are evident. There are no chest wall lesions. Review of the MIP images confirms the above findings. IMPRESSION: 1. No evident pulmonary embolus. No thoracic aortic aneurysm. No dissection seen with contrast bolus in the aorta is less than optimal for potential dissection assessment. 2. Foci of airspace opacity throughout the lungs, most notably in the bilateral lower lobe and lingular regions. Several of these areas appear somewhat nodular without cavitation. The overall appearance is consistent with atypical organism pneumonia. In this regard, advise correlation with COVID-19 status. Nodular opacities of this nature conceivably could have neoplastic etiology, although atypical organism pneumonia is felt to be more likely. Given this circumstance, repeat CT in approximately 4-6 weeks after treatment for presumed infectious etiology advised. 3.  No adenopathy evident by size criteria. 4. Prominence of the main pulmonary outflow tract, a finding felt to be indicative of pulmonary arterial hypertension. Heart enlarged. 5. Reflux of contrast into the inferior vena cava and hepatic veins is felt to be indicative of increased right heart pressure. Electronically Signed   By: Lowella Grip III M.D.   On: 07/02/2020 10:09   DG Chest Port 1 View  Result Date: 07/02/2020 CLINICAL DATA:  Shortness of breath. EXAM: PORTABLE CHEST 1 VIEW COMPARISON:  Chest radiograph 12/17/2019. FINDINGS: Unchanged cardiomegaly. A rounded opacity projecting in the region of the lateral left lung base is favored to reflect a nipple shadow. No appreciable airspace consolidation or pulmonary edema. No evidence of pleural effusion or pneumothorax. No acute bony abnormality identified. IMPRESSION: A rounded opacity projecting in the region of the lateral left lung base is  favored to reflect a nipple shadow. However, attention is recommended on the pending CT angiogram chest to exclude a pulmonary nodule at this site. Unchanged cardiomegaly. No airspace consolidation or pulmonary edema. Electronically Signed   By: Kellie Simmering DO   On: 07/02/2020 08:17         Discharge Exam: Vitals:   07/10/20 2305 07/11/20 0609  BP: 96/71 103/77  Pulse: 98 92  Resp: 16 18  Temp: 98.6 F (37 C) (!) 97.3 F (36.3 C)  SpO2: 98% 99%   Vitals:   07/10/20 1124 07/10/20 1411 07/10/20 2305 07/11/20 0609  BP: 111/70 106/67 96/71 103/77  Pulse: 85 99 98 92  Resp: _0 Temp: 97.7 F (36.5 C) 98.5 F (36.9 C) 98.6 F (37 C) (!) 97.3 F (36.3 C)  TempSrc: Oral Oral Oral Oral  SpO2: 100% 99% 98% 99%  Weight:    65.5 kg  Height:        General: Pt is alert, awake, not in acute distress Cardiovascular: RRR, S1/S2 +, no rubs, no gallops Respiratory: CTA bilaterally, no wheezing, no rhonchi Abdominal: Soft, NT, ND, bowel sounds + Extremities: no edema, no cyanosis   The results of significant diagnostics from this hospitalization (including imaging, microbiology, ancillary and laboratory) are listed below for reference.    Significant Diagnostic Studies: DG Chest 2 View  Result Date: 07/09/2020 CLINICAL DATA:  Shortness of breath.  History  of CHF. EXAM: CHEST - 2 VIEW COMPARISON:  CT and chest radiograph July 02, 2020. FINDINGS: Enlarged cardiac silhouette. Pulmonary vascular congestion. No consolidation. Small right pleural effusion. No visible pneumothorax. No acute osseous abnormality. IMPRESSION: 1. Cardiomegaly and pulmonary vascular congestion without overt pulmonary edema. Small right pleural effusion. 2. Please note that airspace opacities seen on recent CT chest were not well visualized by radiograph at that time. Please see recent CT chest for follow-up recommendations. Electronically Signed   By: Margaretha Sheffield MD   On: 07/09/2020 12:17   CT  Angio Chest PE W and/or Wo Contrast  Result Date: 07/02/2020 CLINICAL DATA:  Shortness of breath.  Lower extremity edema EXAM: CT ANGIOGRAPHY CHEST WITH CONTRAST TECHNIQUE: Multidetector CT imaging of the chest was performed using the standard protocol during bolus administration of intravenous contrast. Multiplanar CT image reconstructions and MIPs were obtained to evaluate the vascular anatomy. CONTRAST:  159m OMNIPAQUE IOHEXOL 350 MG/ML SOLN COMPARISON:  CT angiogram chest December 16, 2019; chest radiograph July 02, 2020 FINDINGS: Cardiovascular: No demonstrable pulmonary emboli evident. No evident thoracic aortic aneurysm. No dissection evident. The contrast bolus in the aorta is not sufficient for confident dissection assessment. Visualized great vessels appear unremarkable. Note that the right innominate and left common carotid arteries arise as a common trunk, an anatomic variant. No appreciable pericardial effusion or pericardial thickening. Heart is enlarged. There is again noted prominence of the main pulmonary outflow tract measuring 3.3 cm consistent with a degree of pulmonary arterial hypertension. Mediastinum/Nodes: Visualized thyroid appears normal. There are subcentimeter mediastinal lymph nodes without evident adenopathy by size criteria. No esophageal lesions are evident. Lungs/Pleura: There are scattered nodular appearing opacities in the lungs bilaterally, multifocal in location but most notable in the lower lobes. There also areas of airspace opacity in the inferior lingula and both lower lobe regions without significant consolidation. No pleural effusions are evident. Upper Abdomen: Reflux of contrast is noted into the inferior vena cava and hepatic veins. Visualized upper abdominal structures otherwise appear unremarkable. Musculoskeletal: No blastic or lytic bone lesions are evident. There are no chest wall lesions. Review of the MIP images confirms the above findings. IMPRESSION: 1. No  evident pulmonary embolus. No thoracic aortic aneurysm. No dissection seen with contrast bolus in the aorta is less than optimal for potential dissection assessment. 2. Foci of airspace opacity throughout the lungs, most notably in the bilateral lower lobe and lingular regions. Several of these areas appear somewhat nodular without cavitation. The overall appearance is consistent with atypical organism pneumonia. In this regard, advise correlation with COVID-19 status. Nodular opacities of this nature conceivably could have neoplastic etiology, although atypical organism pneumonia is felt to be more likely. Given this circumstance, repeat CT in approximately 4-6 weeks after treatment for presumed infectious etiology advised. 3.  No adenopathy evident by size criteria. 4. Prominence of the main pulmonary outflow tract, a finding felt to be indicative of pulmonary arterial hypertension. Heart enlarged. 5. Reflux of contrast into the inferior vena cava and hepatic veins is felt to be indicative of increased right heart pressure. Electronically Signed   By: WLowella GripIII M.D.   On: 07/02/2020 10:09   DG Chest Port 1 View  Result Date: 07/02/2020 CLINICAL DATA:  Shortness of breath. EXAM: PORTABLE CHEST 1 VIEW COMPARISON:  Chest radiograph 12/17/2019. FINDINGS: Unchanged cardiomegaly. A rounded opacity projecting in the region of the lateral left lung base is favored to reflect a nipple shadow. No appreciable airspace consolidation or  pulmonary edema. No evidence of pleural effusion or pneumothorax. No acute bony abnormality identified. IMPRESSION: A rounded opacity projecting in the region of the lateral left lung base is favored to reflect a nipple shadow. However, attention is recommended on the pending CT angiogram chest to exclude a pulmonary nodule at this site. Unchanged cardiomegaly. No airspace consolidation or pulmonary edema. Electronically Signed   By: Kellie Simmering DO   On: 07/02/2020 08:17      Microbiology: Recent Results (from the past 240 hour(s))  Respiratory Panel by RT PCR (Flu A&B, Covid) - Nasopharyngeal Swab     Status: None   Collection Time: 07/02/20  7:55 AM   Specimen: Nasopharyngeal Swab  Result Value Ref Range Status   SARS Coronavirus 2 by RT PCR NEGATIVE NEGATIVE Final    Comment: (NOTE) SARS-CoV-2 target nucleic acids are NOT DETECTED.  The SARS-CoV-2 RNA is generally detectable in upper respiratoy specimens during the acute phase of infection. The lowest concentration of SARS-CoV-2 viral copies this assay can detect is 131 copies/mL. A negative result does not preclude SARS-Cov-2 infection and should not be used as the sole basis for treatment or other patient management decisions. A negative result may occur with  improper specimen collection/handling, submission of specimen other than nasopharyngeal swab, presence of viral mutation(s) within the areas targeted by this assay, and inadequate number of viral copies (<131 copies/mL). A negative result must be combined with clinical observations, patient history, and epidemiological information. The expected result is Negative.  Fact Sheet for Patients:  PinkCheek.be  Fact Sheet for Healthcare Providers:  GravelBags.it  This test is no t yet approved or cleared by the [place] FDA and  has been authorized for detection and/or diagnosis of SARS-CoV-2 by FDA under an Emergency Use Authorization (EUA). This EUA will remain  in effect (meaning this test can be used) for the duration of the COVID-19 declaration under Section 564(b)(1) of the Act, 21 U.S.C. section 360bbb-3(b)(1), unless the authorization is terminated or revoked sooner.     Influenza A by PCR NEGATIVE NEGATIVE Final   Influenza B by PCR NEGATIVE NEGATIVE Final    Comment: (NOTE) The Xpert Xpress SARS-CoV-2/FLU/RSV assay is intended as an aid in  the diagnosis of  influenza from Nasopharyngeal swab specimens and  should not be used as a sole basis for treatment. Nasal washings and  aspirates are unacceptable for Xpert Xpress SARS-CoV-2/FLU/RSV  testing.  Fact Sheet for Patients: PinkCheek.be  Fact Sheet for Healthcare Providers: GravelBags.it  This test is not yet approved or cleared by the [place] FDA and  has been authorized for detection and/or diagnosis of SARS-CoV-2 by  FDA under an Emergency Use Authorization (EUA). This EUA will remain  in effect (meaning this test can be used) for the duration of the  Covid-19 declaration under Section 564(b)(1) of the Act, 21  U.S.C. section 360bbb-3(b)(1), unless the authorization is  terminated or revoked. Performed at [facility], [address] [place], [place] [address]   Resp Panel by RT-PCR (Flu A&B, Covid) Nasopharyngeal Swab     Status: None   Collection Time: 07/09/20  9:04 PM   Specimen: Nasopharyngeal Swab; Nasopharyngeal(NP) swabs in vial transport medium  Result Value Ref Range Status   SARS Coronavirus 2 by RT PCR NEGATIVE NEGATIVE Final    Comment: (NOTE) SARS-CoV-2 target nucleic acids are NOT DETECTED.  The SARS-CoV-2 RNA is generally detectable in upper respiratory specimens during the acute phase of infection. The lowest concentration of  SARS-CoV-2 viral copies this assay can detect is 138 copies/mL. A negative result does not preclude SARS-Cov-2 infection and should not be used as the sole basis for treatment or other patient management decisions. A negative result may occur with  improper specimen collection/handling, submission of specimen other than nasopharyngeal swab, presence of viral mutation(s) within the areas targeted by this assay, and inadequate number of viral copies(<138 copies/mL). A negative result must be combined with clinical observations, patient history, and epidemiological information. The  expected result is Negative.  Fact Sheet for Patients:  EntrepreneurPulse.com.au  Fact Sheet for Healthcare Providers:  IncredibleEmployment.be  This test is no t yet approved or cleared by the [place] FDA and  has been authorized for detection and/or diagnosis of SARS-CoV-2 by FDA under an Emergency Use Authorization (EUA). This EUA will remain  in effect (meaning this test can be used) for the duration of the COVID-19 declaration under Section 564(b)(1) of the Act, 21 U.S.C.section 360bbb-3(b)(1), unless the authorization is terminated  or revoked sooner.       Influenza A by PCR NEGATIVE NEGATIVE Final   Influenza B by PCR NEGATIVE NEGATIVE Final    Comment: (NOTE) The Xpert Xpress SARS-CoV-2/FLU/RSV plus assay is intended as an aid in the diagnosis of influenza from Nasopharyngeal swab specimens and should not be used as a sole basis for treatment. Nasal washings and aspirates are unacceptable for Xpert Xpress SARS-CoV-2/FLU/RSV testing.  Fact Sheet for Patients: EntrepreneurPulse.com.au  Fact Sheet for Healthcare Providers: IncredibleEmployment.be  This test is not yet approved or cleared by the [place] FDA and has been authorized for detection and/or diagnosis of SARS-CoV-2 by FDA under an Emergency Use Authorization (EUA). This EUA will remain in effect (meaning this test can be used) for the duration of the COVID-19 declaration under Section 564(b)(1) of the Act, 21 U.S.C. section 360bbb-3(b)(1), unless the authorization is terminated or revoked.  Performed at [facility], [address] [place], [place] [address]      Labs: Basic Metabolic Panel: Recent Labs  Lab 07/09/20 1158 07/09/20 1158 07/09/20 2101 07/09/20 2101 07/09/20 2301 07/10/20 0524 07/11/20 0627  NA 135  --  131*  --   --  134* 134*  K 3.9   < > 4.9   < >  --  3.5 3.8  CL 100  --  97*  --   --  98 100   CO2 25  --  25  --   --  27 27  GLUCOSE 277*  --  438*  --   --  297* 277*  BUN 10  --  13  --   --  12 13  CREATININE 0.62  --  0.69  --   --  0.53 0.64  CALCIUM 8.3*  --  8.2*  --   --  8.3* 8.0*  MG  --   --   --   --  1.6*  --  1.6*  PHOS  --   --   --   --  3.5  --   --    < > = values in this interval not displayed.   Liver Function Tests: Recent Labs  Lab 07/09/20 2301  AST 79*  ALT 29  ALKPHOS 238*  BILITOT 0.4  PROT 7.5  ALBUMIN 2.7*   No results for input(s): LIPASE, AMYLASE in the last 168 hours. No results for input(s): AMMONIA in the last 168 hours. CBC: Recent Labs  Lab 07/09/20 1158 07/11/20 2694  WBC 8.8 7.9  HGB 12.1 11.4*  HCT 38.8 35.4*  MCV 95.1 94.9  PLT 345 321   Cardiac Enzymes: No results for input(s): CKTOTAL, CKMB, CKMBINDEX, TROPONINI in the last 168 hours. BNP: Invalid input(s): POCBNP CBG: Recent Labs  Lab 07/10/20 2119 07/11/20 0322 07/11/20 0333 07/11/20 0756 07/11/20 1121  GLUCAP 147* 242* 253* 228* 207*    Time coordinating discharge:  36 minutes  Signed:  Orson Eva, DO [facility] Pager: [phone] 07/11/2020, 5:57 PM

## 2020-07-11 NOTE — Progress Notes (Signed)
.   Progress Note  Patient Name: Cheryl Mercer Date of Encounter: 07/11/2020  Primary Cardiologist: Nicholes Mango, MD  Subjective   Feels much better and would like to go home today.  States that her leg swelling has improved significantly, not short of breath.  Inpatient Medications    Scheduled Meds: . atorvastatin  40 mg Oral q1800  . carvedilol  6.25 mg Oral BID WC  . enoxaparin (LOVENOX) injection  40 mg Subcutaneous Q24H  . furosemide  40 mg Intravenous BID  . gabapentin  300 mg Oral TID  . insulin aspart  0-15 Units Subcutaneous TID WC  . insulin aspart  10 Units Subcutaneous TID WC  . insulin glargine  40 Units Subcutaneous Daily  . lisinopril  10 mg Oral Daily  . spironolactone  25 mg Oral Daily  . traZODone  50 mg Oral QHS    PRN Meds: acetaminophen **OR** acetaminophen, ondansetron **OR** ondansetron (ZOFRAN) IV   Vital Signs    Vitals:   07/10/20 1124 07/10/20 1411 07/10/20 2305 07/11/20 0609  BP: 111/70 106/67 96/71 103/77  Pulse: 85 99 98 92  Resp: 20 20 16 18   Temp: 97.7 F (36.5 C) 98.5 F (36.9 C) 98.6 F (37 C) (!) 97.3 F (36.3 C)  TempSrc: Oral Oral Oral Oral  SpO2: 100% 99% 98% 99%  Weight:    65.5 kg  Height:        Intake/Output Summary (Last 24 hours) at 07/11/2020 0926 Last data filed at 07/10/2020 1700 Gross per 24 hour  Intake 1560 ml  Output --  Net 1560 ml   Filed Weights   07/09/20 1931 07/10/20 0413 07/11/20 0609  Weight: 60.3 kg 67.6 kg 65.5 kg    Telemetry    Sinus rhythm.  Personally reviewed.  ECG    No ECG reviewed.  Physical Exam   GEN: No acute distress.   Neck: No JVD. Cardiac: RRR, no murmur, rub, or gallop.  Respiratory: Nonlabored. Clear to auscultation bilaterally. GI: Soft, nontender, bowel sounds present. MS: No edema; No deformity.  Labs    Chemistry Recent Labs  Lab 07/09/20 2101 07/09/20 2301 07/10/20 0524 07/11/20 0627  NA 131*  --  134* 134*  K 4.9  --  3.5 3.8  CL 97*   --  98 100  CO2 25  --  27 27  GLUCOSE 438*  --  297* 277*  BUN 13  --  12 13  CREATININE 0.69  --  0.53 0.64  CALCIUM 8.2*  --  8.3* 8.0*  PROT  --  7.5  --   --   ALBUMIN  --  2.7*  --   --   AST  --  79*  --   --   ALT  --  29  --   --   ALKPHOS  --  238*  --   --   BILITOT  --  0.4  --   --   GFRNONAA >60  --  >60 >60  ANIONGAP 9  --  9 7     Hematology Recent Labs  Lab 07/09/20 1158 07/11/20 0627  WBC 8.8 7.9  RBC 4.08 3.73*  HGB 12.1 11.4*  HCT 38.8 35.4*  MCV 95.1 94.9  MCH 29.7 30.6  MCHC 31.2 32.2  RDW 13.2 13.2  PLT 345 321    Cardiac Enzymes Recent Labs  Lab 07/02/20 0808 07/02/20 1004 07/09/20 2101 07/09/20 2301  TROPONINIHS 13 15 15  14  BNP Recent Labs  Lab 07/09/20 1158 07/09/20 2109  BNP 705.0* 856.0*     Radiology    DG Chest 2 View  Result Date: 07/09/2020 CLINICAL DATA:  Shortness of breath.  History of CHF. EXAM: CHEST - 2 VIEW COMPARISON:  CT and chest radiograph July 02, 2020. FINDINGS: Enlarged cardiac silhouette. Pulmonary vascular congestion. No consolidation. Small right pleural effusion. No visible pneumothorax. No acute osseous abnormality. IMPRESSION: 1. Cardiomegaly and pulmonary vascular congestion without overt pulmonary edema. Small right pleural effusion. 2. Please note that airspace opacities seen on recent CT chest were not well visualized by radiograph at that time. Please see recent CT chest for follow-up recommendations. Electronically Signed   By: Feliberto Harts MD   On: 07/09/2020 12:17    Cardiac Studies   Echocardiogram 12/20/2019: 1. Left ventricular ejection fraction, by estimation, is 25 to 30%. The  left ventricle has severely decreased function. The left ventricle  demonstrates global hypokinesis. The left ventricular internal cavity size  was mildly dilated. Left ventricular  diastolic parameters are consistent with Grade II diastolic dysfunction  (pseudonormalization). Elevated left atrial  pressure.  2. Right ventricular systolic function is mildly reduced. The right  ventricular size is normal. There is normal pulmonary artery systolic  pressure.  3. The mitral valve is abnormal. Mild mitral valve regurgitation. No  evidence of mitral stenosis.  4. The aortic valve is tricuspid. Aortic valve regurgitation is not  visualized. No aortic stenosis is present.  5. The inferior vena cava is normal in size with <50% respiratory  variability, suggesting right atrial pressure of 8 mmHg.   Patient Profile     34 y.o. female with a history of nonischemic cardiomyopathy, LVEF 25 to 30%, COVID-19 pneumonia in March, type 2 diabetes mellitus, also pulmonary emboli, now presenting with acute on chronic systolic heart failure in the setting of medication noncompliance.  Assessment & Plan    1.  Nonischemic cardiomyopathy with LVEF 25 to 30%.  Cardiac catheterization in August 2020 revealed no significant CAD.  She is currently on Coreg, lisinopril, Aldactone, and IV Lasix.  2.  Acute on chronic systolic heart failure, fluid status improved.  She has been on Lasix 40 mg IV twice daily, leg swelling essentially back to baseline per discussion with patient.  She has not been weighing herself daily at home so uncertain about true baseline weight.  Renal function normal.  3.  Mixed hyperlipidemia, LDL 75, she is on Lipitor.  4.  Uncontrolled type 2 diabetes mellitus, hemoglobin A1c 13.4%.  Presently on insulin.  Patient hemodynamically stable and feels better in terms of fluid status.  She would like to go home today.  Reasonable for discharge on current doses of Coreg, lisinopril, and Aldactone. Switch to oral Lasix 20 mg daily, no potassium supplement as yet.  She is scheduled to be seen in the heart failure clinic on December 7, I have encouraged her to keep this visit.  She will need to have a BMET around the time of that visit, She reports better insurance coverage now, would be a good  candidate for switching to [facility] and considering an SGLT2 i.  Compliance with diet and medications is going to be key.  I talked with her about daily weights as well.  Signed, Nona Dell, MD  07/11/2020, 9:26 AM

## 2020-07-17 ENCOUNTER — Encounter (HOSPITAL_COMMUNITY): Payer: Self-pay

## 2020-07-23 ENCOUNTER — Telehealth (HOSPITAL_COMMUNITY): Payer: Self-pay

## 2020-07-23 NOTE — Telephone Encounter (Signed)
Appointment reminder call made. Patient aware.

## 2020-07-24 ENCOUNTER — Other Ambulatory Visit: Payer: Self-pay

## 2020-07-24 ENCOUNTER — Encounter (HOSPITAL_COMMUNITY): Payer: Self-pay

## 2020-07-24 ENCOUNTER — Ambulatory Visit (HOSPITAL_COMMUNITY)
Admit: 2020-07-24 | Discharge: 2020-07-24 | Disposition: A | Discharge status: Home / Self Care | Payer: Medicaid Other | Attending: Cardiology | Admitting: Cardiology

## 2020-07-24 VITALS — BP 112/80 | HR 92 | Wt 142.8 lb

## 2020-07-24 DIAGNOSIS — E1165 Type 2 diabetes mellitus with hyperglycemia: Secondary | ICD-10-CM | POA: Diagnosis not present

## 2020-07-24 DIAGNOSIS — Z794 Long term (current) use of insulin: Secondary | ICD-10-CM | POA: Diagnosis not present

## 2020-07-24 DIAGNOSIS — Z7901 Long term (current) use of anticoagulants: Secondary | ICD-10-CM | POA: Diagnosis not present

## 2020-07-24 DIAGNOSIS — Z79899 Other long term (current) drug therapy: Secondary | ICD-10-CM | POA: Insufficient documentation

## 2020-07-24 DIAGNOSIS — Z86711 Personal history of pulmonary embolism: Secondary | ICD-10-CM | POA: Insufficient documentation

## 2020-07-24 DIAGNOSIS — I5042 Chronic combined systolic (congestive) and diastolic (congestive) heart failure: Secondary | ICD-10-CM | POA: Diagnosis not present

## 2020-07-24 DIAGNOSIS — Z8249 Family history of ischemic heart disease and other diseases of the circulatory system: Secondary | ICD-10-CM | POA: Insufficient documentation

## 2020-07-24 DIAGNOSIS — Z8616 Personal history of COVID-19: Secondary | ICD-10-CM | POA: Diagnosis not present

## 2020-07-24 DIAGNOSIS — I110 Hypertensive heart disease with heart failure: Secondary | ICD-10-CM | POA: Insufficient documentation

## 2020-07-24 DIAGNOSIS — Z91120 Patient's intentional underdosing of medication regimen due to financial hardship: Secondary | ICD-10-CM | POA: Diagnosis not present

## 2020-07-24 LAB — BASIC METABOLIC PANEL
Anion gap: 9 (ref 5–15)
BUN: 9 mg/dL (ref 6–20)
CO2: 25 mmol/L (ref 22–32)
Calcium: 8.7 mg/dL — ABNORMAL LOW (ref 8.9–10.3)
Chloride: 103 mmol/L (ref 98–111)
Creatinine, Ser: 0.55 mg/dL (ref 0.44–1.00)
GFR, Estimated: >60 mL/min (ref >60)
Glucose, Bld: 96 mg/dL (ref 70–99)
Potassium: 3.7 mmol/L (ref 3.5–5.1)
Sodium: 137 mmol/L (ref 135–145)

## 2020-07-24 MED ORDER — ENTRESTO 24-26 MG PO TABS: 1.0000 | ORAL_TABLET | Freq: Two times a day (BID) | ORAL | 11 refills | Status: DC

## 2020-07-24 MED ORDER — FUROSEMIDE 20 MG PO TABS
60.0000 mg | ORAL_TABLET | Freq: Every day | ORAL | 1 refills | Status: DC
Start: 2020-07-24 — End: 2020-07-27

## 2020-07-24 NOTE — Progress Notes (Signed)
07/24/2020 Cheryl Mercer   [birth year]  [id number]  Primary Physician Lorelee Market, MD Primary Cardiologist: Dr. Haroldine Laws Electrophysiologist: None   Reason for Visit/CC: f/u for chronic systolic HF/ NICM and HTN  HPI:  Ms.Carrizalesis a 34 year old hispanic female with a past medical history of hypertension and poorly controlled type 2 diabetes mellitus, recently admitted to the hospital 03/2019 w/ SOB and diagnosed w/ CHF. In the ED, she was tachycardic and hypertensive. BNP 1,418.4. DM poorly controlled w/ Hgb A1c >13. She was diuresed w/ IV Lasix and started on DM medications. 2D echo showed severely reduced LVEF at 10-15% and moderately reduced RV. RHC/LHC  03/25/19 showed no CAD. Well compensated hemodynamics with low filling pressures. 03/25/19 cMRI w/ EF at 15% and no significant LGE. Started on guidelines directed medical therapy>>Entresto, Jardiance, spironolactone and digoxin. Lasix was discontinued. At f/u visit, Coreg was added to regimen for tachycardia and HTN.  She was seen back in [facility] 04/2019 for f/u and was doing fairly well but unfortunately was lost to f/u after that. She has been seen several times by general cardiology for readmits for CHF in the past year.   Per records, she had COVID in 08/2019.  She was admitted 4/21 w/ PE w/ pulmonary infarction and started on Eliquis. Her most recent admit was 11/21 for CHF. Per notes, she had lost insurance for a period of time and was no longer on Entresto nor Jardiance. At recent d/c, she was sent home on Coreg, lisinopril, and Aldactone and oral Lasix 20 mg daily. Eliquis was discontinued.   She returns now to [facility] for f/u. She still feels like she is "carrying fluid". Complains of exertional dyspnea and orthopnea. No PND. Reports poor urinary response to 20 mg PO Lasix. Last week she increased to 40 mg daily but still no significant UOP or improvement in symptoms. Admits to eating a high sodium diet. ReDs Clip elevated at  43% today. Denies resting dyspnea. O2 sats 98% on RA. She does report that she now has new health insurance.   She was considered today for At Home Trial, but unfortunately, she does not qualify for enrollment (has not been on 40 mg of lasix for the past 4 weeks). Unfortunately also not ideal to restart Jardiance given poorly controlled hgb A1c at 13.4      Cardiac Studies   2D Echo 03/23/19  IMPRESSIONS    1. The left ventricle has a visually estimated ejection fraction of 10-15%%. The cavity size was severely dilated. Left ventricular diastolic Doppler parameters are consistent with restrictive filling.  2. The right ventricle has moderately reduced systolic function. The cavity was normal. There is no increase in right ventricular wall thickness. Right ventricular systolic pressure is mildly elevated with an estimated pressure of 33.7 mmHg.  3. Left atrial size was moderately dilated.  4. Right atrial size was mildly dilated.  5. No evidence of mitral valve stenosis.  6. No stenosis of the aortic valve.  7. The aorta is normal in size and structure.  8. The aortic root and ascending aorta are normal in size and structure.  9. The inferior vena cava was dilated in size with <50% respiratory variability.  R/LHC 03/25/19 Procedures  RIGHT/LEFT HEART CATH AND CORONARY ANGIOGRAPHY  Conclusion  Findings:  Ao = 122/88 (104) LV = 125/15 RA = 2 RV = 35/6 PA = 37/14 (25) PCW = 18 Fick cardiac output/index = 5.3/3.2 SVR = 1543 PVR = 1.3 WU Ao sat =  99% PA sat = 74%, 76%  Assessment: 1. Normal coronary arteries 2. 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 Well-compensated hemodynamics    Echocardiogram 12/20/2019: 1. Left ventricular ejection fraction, by estimation, is 25 to 30%. The  left ventricle has severely decreased function. The left ventricle  demonstrates global hypokinesis. The left ventricular internal cavity size  was mildly dilated. Left ventricular  diastolic parameters are  consistent with Grade II diastolic dysfunction  (pseudonormalization). Elevated left atrial pressure.  2. Right ventricular systolic function is mildly reduced. The right  ventricular size is normal. There is normal pulmonary artery systolic  pressure.  3. The mitral valve is abnormal. Mild mitral valve regurgitation. No  evidence of mitral stenosis.  4. The aortic valve is tricuspid. Aortic valve regurgitation is not  visualized. No aortic stenosis is present.  5. The inferior vena cava is normal in size with <50% respiratory  variability, suggesting right atrial pressure of 8 mmHg.     Current Meds  Medication Sig  . atorvastatin (LIPITOR) 40 MG tablet Take 1 tablet (40 mg total) by mouth daily at 6 PM.  . blood glucose meter kit and supplies Dispense based on patient and insurance preference. Check blood sugar 4 times daily, three times before meals and once before bedtime. (FOR ICD-10 E10.9, E11.9).  . carvedilol (COREG) 6.25 MG tablet Take 1 tablet (6.25 mg total) by mouth 2 (two) times daily with a meal.  . furosemide (LASIX) 20 MG tablet Take 1 tablet (20 mg total) by mouth daily.  Marland Kitchen gabapentin (NEURONTIN) 300 MG capsule Take 1 capsule (300 mg total) by mouth 3 (three) times daily.  . insulin aspart (NOVOLOG FLEXPEN) 100 UNIT/ML FlexPen Inject 10 Units into the skin 3 (three) times daily with meals.  . insulin glargine (LANTUS) 100 UNIT/ML Solostar Pen Inject 40 Units into the skin daily.  Marland Kitchen lisinopril (ZESTRIL) 10 MG tablet Take 1 tablet (10 mg total) by mouth daily.  . metFORMIN (GLUCOPHAGE) 500 MG tablet Take 1 tablet (500 mg total) by mouth 2 (two) times daily with a meal.  . spironolactone (ALDACTONE) 25 MG tablet Take 1 tablet (25 mg total) by mouth daily.   Allergies  Allergen Reactions  . Ibuprofen Anaphylaxis   Past Medical History:  Diagnosis Date  . CHF (congestive heart failure) ([place])   . Chronic back pain   . Chronic combined systolic and diastolic  congestive heart failure ([place]) 09/07/2019  . Community acquired pneumonia 12/16/2019  . Diabetes mellitus without complication ([place])   . History of COVID-19 11/07/2019  . Migraines   . No pertinent past medical history   . Pregnancy induced hypertension   . Pulmonary embolus ([place]) 12/16/2019  . Pulmonary infarct ([place]) 12/17/2019   Family History  Problem Relation Age of Onset  . Hypertension Mother   . Anesthesia problems Neg Hx    Past Surgical History:  Procedure Laterality Date  . CERVICAL BIOPSY  W/ LOOP ELECTRODE EXCISION  2009  . Laparoscopic tubal sterilization with Falope rings.  2009  . RIGHT/LEFT HEART CATH AND CORONARY ANGIOGRAPHY N/A 03/25/2019   Procedure: RIGHT/LEFT HEART CATH AND CORONARY ANGIOGRAPHY;  Surgeon: Jolaine Artist, MD;  Location: [facility];  Service: Cardiovascular;  Laterality: N/A;  . TUBAL LIGATION     Social History   Socioeconomic History  . Marital status: Married    Spouse name: Not on file  . Number of children: 4  . Years of education: Not on file  . Highest education  level: Not on file  Occupational History  . Not on file  Tobacco Use  . Smoking status: Never Smoker  . Smokeless tobacco: Never Used  Vaping Use  . Vaping Use: Never used  Substance and Sexual Activity  . Alcohol use: No    Alcohol/week: 6.0 standard drinks    Types: 6 Shots of liquor per week  . Drug use: No    Types: Marijuana  . Sexual activity: Not Currently    Birth control/protection: Surgical    Comment: band placed around tubes in 2009 surgically by Dr. Glo Herring  Other Topics Concern  . Not on file  Social History Narrative  . Not on file   Social Determinants of Health   Financial Resource Strain:   . Difficulty of Paying Living Expenses: Not on file  Food Insecurity:   . Worried About Charity fundraiser in the Last Year: Not on file  . Ran Out of Food in the Last Year: Not on file  Transportation Needs:   . Lack of Transportation (Medical):  Not on file  . Lack of Transportation (Non-Medical): Not on file  Physical Activity:   . Days of Exercise per Week: Not on file  . Minutes of Exercise per Session: Not on file  Stress:   . Feeling of Stress : Not on file  Social Connections:   . Frequency of Communication with Friends and Family: Not on file  . Frequency of Social Gatherings with Friends and Family: Not on file  . Attends Religious Services: Not on file  . Active Member of Clubs or Organizations: Not on file  . Attends Archivist Meetings: Not on file  . Marital Status: Not on file  Intimate Partner Violence:   . Fear of Current or Ex-Partner: Not on file  . Emotionally Abused: Not on file  . Physically Abused: Not on file  . Sexually Abused: Not on file     Lipid Panel     Component Value Date/Time   CHOL 135 07/10/2020 0550   TRIG 100 07/10/2020 0550   HDL 40 (L) 07/10/2020 0550   CHOLHDL 3.4 07/10/2020 0550   VLDL 20 07/10/2020 0550   LDLCALC 75 07/10/2020 0550    Review of Systems: General: negative for chills, fever, night sweats or weight changes.  Cardiovascular: negative for chest pain, dyspnea on exertion, edema, orthopnea, palpitations, paroxysmal nocturnal dyspnea or shortness of breath Dermatological: negative for rash Respiratory: negative for cough or wheezing Urologic: negative for hematuria Abdominal: negative for nausea, vomiting, diarrhea, bright red blood per rectum, melena, or hematemesis Neurologic: negative for visual changes, syncope, or dizziness All other systems reviewed and are otherwise negative except as noted above.   Physical Exam:  Blood pressure 112/80, pulse 92, weight 64.8 kg, last menstrual period 06/30/2020, SpO2 98 %.  Today's Vitals   07/24/20 0852  BP: 112/80  Pulse: 92  SpO2: 98%  Weight: 64.8 kg   Body mass index is 26.12 kg/m.  ReDs Clip 43%  General:  Well appearing. No respiratory difficulty HEENT: normal Neck: supple. JVD 10 cm. Carotids  2+ bilat; no bruits. No lymphadenopathy or thyromegaly appreciated. Cor: PMI nondisplaced. Regular rate & rhythm. No rubs, gallops or murmurs. Lungs: clear, no wheezing  Abdomen: soft, nontender, nondistended. No hepatosplenomegaly. No bruits or masses. Good bowel sounds. Extremities: no cyanosis, clubbing, rash, trace bilateral LE edema Neuro: alert & oriented x 3, cranial nerves grossly intact. moves all 4 extremities w/o difficulty. Affect pleasant.  EKG not performed today   ASSESSMENT AND PLAN:   1. Chronic Systolic HF/ NICM: 2D echo 03/2019 showed severely reduced LVEF at 10-15% and moderately reduced RV. RHC/LHC showed no CAD. Well compensated hemodynamics with low filling pressures. cMRI w/ EF at 15% and no significant LGE. Echo 5/21 EF 25-30%. RV mildly reduced   Suspect CM likely caused by poorly controlled HTN and DM. - She is fluid overloaded on exam today and symptomatic, NYHA Class II-III.  - does not qualify for At Home Trial  - Increase Lasix to 60 mg daily  - Stop Lisinopril (last dose was this am) - Start Entresto 24-26 mg bid on morning of 12/9 - Continue Spironolactone 25 -Will hold off on restarting Jardiance at this time given Hgb A1c 13 and risk for GU infections.  - Continue Coreg 6.25 mg bid - Check BMP today and again in 1 week  -Continue daily weights and encouraged low sodium diet -Will ultimately need repeat echocardiogram, 3 months after treatment w/ maximumly tolerated HF regimen w/ intent to refer to EP for ICD if EF remains < 35%.   2. HTN:  - controlled on current regimen. Will continue present meds w/ exception of lisinopril. Start Entresto after 36 hr ACEi washout - BMP today and again in 7 days     3. Poorly Controlled T2DM: recent hgb a1c 13 - previously on Jardiance but no longer on due to cost. She now has new insurance but would avoid restarting for now given elevated Hgb A1c and risk for GU infection  - continue Insulin + Metformin  - further  management per PCP/ endocrinologist   4. H/o COVID 19  - 08/2019  5. H/o PE w/ Pulmonary Infarct - diagnosed 11/2019 - treated w/ Eliquis x 6 months.   F/u w/ PharmD in 2 weeks for further titration of Entresto. F/u w/ APP in 4-6 weeks.   Nakeesha Bowler Ladoris Gene, MHS [facility] 07/24/2020 8:56 AM

## 2020-07-24 NOTE — Addendum Note (Signed)
Encounter addended by: Theresia Bough, CMA on: 07/24/2020 10:32 AM  Actions taken: Charge Capture section accepted

## 2020-07-24 NOTE — Progress Notes (Signed)
Medication Samples have been provided to the patient.  Drug name: entresto       Strength: 24/26        Qty: 28  LOT: [id number]  Exp.Date: 07/2022  Dosing instructions: ONE TAB TWICE A DAY  The patient has been instructed regarding the correct time, dose, and frequency of taking this medication, including desired effects and most common side effects.   Theresia Bough 9:47 AM 07/24/2020

## 2020-07-24 NOTE — Patient Instructions (Addendum)
STOP Lisinpril WAIT 36 hours START Entresto 24/26 mg, one tab twice daily (first dose in the afternoon on 07/26/20) INCREASE Lasix to 60 mg daily  Labs today We will only contact you if something comes back abnormal or we need to make some changes. Otherwise no news is good news!  Labs needed in 7-10 days  Your physician recommends that you schedule a follow-up appointment in: 2 weeks with the pharmacy team  Your physician recommends that you schedule a follow-up appointment in: 6 weeks  in the Advanced Practitioners (PA/NP) Clinic   If you have any questions or concerns before your next appointment please send [place] a message through [place] or call our office at [phone].    TO LEAVE A MESSAGE FOR THE NURSE SELECT OPTION 2, PLEASE LEAVE A MESSAGE INCLUDING: . YOUR NAME . DATE OF BIRTH . CALL BACK NUMBER . REASON FOR CALL**this is important as we prioritize the call backs  YOU WILL RECEIVE A CALL BACK THE SAME DAY AS LONG AS YOU CALL BEFORE 4:00 PM

## 2020-07-26 ENCOUNTER — Encounter (HOSPITAL_COMMUNITY): Payer: Self-pay

## 2020-07-27 ENCOUNTER — Other Ambulatory Visit (HOSPITAL_COMMUNITY): Payer: Self-pay

## 2020-07-27 ENCOUNTER — Telehealth (HOSPITAL_COMMUNITY): Payer: Self-pay | Admitting: Pharmacy Technician

## 2020-07-27 MED ORDER — FUROSEMIDE 20 MG PO TABS
60.0000 mg | ORAL_TABLET | Freq: Every day | ORAL | 11 refills | Status: DC
Start: 2020-07-27 — End: 2021-06-15

## 2020-07-27 NOTE — Progress Notes (Incomplete)
***In Progress***  Primary Physician Evelene Croon, MD Primary Cardiologist: Dr. Gala Romney Electrophysiologist: None   HPI:  Ms.Carrizalesis a 34 year old hispanic female with a past medical history of hypertension and poorly controlled type 2 diabetes mellitus, recently admitted to the hospital 03/2019 w/ SOB and diagnosed w/ CHF. In the ED, she was tachycardic and hypertensive. BNP 1,418.4. DM poorly controlled w/ Hgb A1c >13. She was diuresed w/ IV furosemide and started on DM medications. 2D echo showed severely reduced LVEF at 10-15% and moderately reduced RV. RHC/LHC8/7/20showed no CAD. Well compensated hemodynamics with low filling pressures. 8/7/20cMRI w/ EF at 15% and no significant LGE. Started on guideline directed medical therapy>>Entresto, Jardiance, spironolactone and digoxin. furosemide was discontinued. At f/u visit, carvedilol was added to regimen for tachycardia and HTN.  She was seen back in [facility] 04/2019 for f/u and was doing fairly well but unfortunately was lost to f/u after that. She has been seen several times by general cardiology for readmits for CHF in the past year.   Per records, she had COVID in 08/2019.  She was admitted 4/21 w/ PE w/ pulmonary infarction and started on Eliquis. Her most recent admit was 11/21 for CHF. Per notes, she had lost insurance for a period of time and was no longer on Entresto nor Jardiance. At recent d/c, she was sent home on carvedilol, lisinopril, spironolactone and oral furosemide 20 mg daily. Eliquis was discontinued.   She recently returned to HF Clinic for follow up on 07/24/20. She still felt like she was "carrying fluid". Complained of exertional dyspnea and orthopnea. No PND. Reported poor urinary response to 20 mg PO furosemide. Last week she had increased to 40 mg daily but still no significant UOP or improvement in symptoms. Admitted to eating a high sodium diet. ReDs Clip elevated at 43% in clinic. Denied resting dyspnea.  O2 sats 98% on RA. She reported that she now has new health insurance. She was considered for At Home Trial, but unfortunately, she did not qualify for enrollment (had not been on 40 mg of furosemide for the past 4 weeks). Unfortunately, also not ideal to restart Jardiance given poorly controlled hgb A1c at 13.4%.   Today he returns to HF clinic for pharmacist medication titration. At last visit with APP, furosemide was increased to 60 mg daily. Additionally, lisinopril was discontinued and Entresto 24/26 mg BID was initiated after 36 hour washout period.   . Shortness of breath/dyspnea on exertion? {YES [id number]  . Orthopnea/PND? {YES [id number] . Edema? {YES [id number] . Lightheadedness/dizziness? {YES [id number] . Daily weights at home? {YES [id number] . Blood pressure/heart rate monitoring at home? {YES [id number] . Following low-sodium/fluid-restricted diet? {YES NO:22349}  HF Medications: Carvedilol 6.25 mg BID Entresto 24/26 mg BID Spironolactone 25 mg daily Furosemide 60 mg daily   Has the patient been experiencing any side effects to the medications prescribed?  {YES NO:22349}  Does the patient have any problems obtaining medications due to transportation or finances?   {YES NO:22349}  Understanding of regimen: {excellent/good/fair/poor:19665} Understanding of indications: {excellent/good/fair/poor:19665} Potential of compliance: {excellent/good/fair/poor:19665} Patient understands to avoid NSAIDs. Patient understands to avoid decongestants.    Pertinent Lab Values: . Serum creatinine ***, BUN ***, Potassium ***, Sodium ***, BNP ***, Magnesium ***, Digoxin ***   Vital Signs: . Weight: *** (last clinic weight: ***) . Blood pressure: ***  . Heart rate: ***   Assessment: 1. Chronic Systolic HF/ NICM: 2D echo 03/2019 showed severely reduced LVEF at 10-15% and moderately reduced RV.  RHC/LHCshowed no CAD. Well compensated hemodynamics with low filling pressures. cMRI w/ EF at  15% and no significant LGE. Echo 5/21 EF 25-30%. RV mildly reduced   Suspect CM likely caused by poorly controlled HTN and DM. - She is fluid overloaded on exam today and symptomatic, NYHA Class II-III.  *** - Continue furosemide 60 mg daily  - Continue carvedilol 6.25 mg BID - Continue Entresto 24-26 mg BID - Continue Spironolactone 25 mg daily -Will hold off on restarting Jardiance at this time given Hgb A1c 13 and risk for GU infections.  -Continue daily weights and encouraged low sodium diet -Will ultimately need repeat echocardiogram, 3 months after treatment w/ maximumly tolerated HF regimen w/ intent to refer to EP for ICD if EF remains < 35%.   2. HTN:  - controlled on current regimen.     3. Poorly Controlled T2DM: recent hgb a1c 13 - previously on Jardiance but no longer on due to cost. She now has new insurance but would avoid restarting for now given elevated Hgb A1c and risk for GU infection  - continue Insulin + Metformin  - further management per PCP/ endocrinologist   4. H/o COVID 19  - 08/2019  5. H/o PE w/ Pulmonary Infarct - diagnosed 11/2019 - treated w/ Eliquis x 6 months.    Plan: 1) Medication changes: Based on clinical presentation, vital signs and recent labs will *** 2) Labs: *** 3) Follow-up: ***   Karle Plumber, PharmD, BCPS, BCCP, CPP Heart Failure Clinic Pharmacist [phone]

## 2020-07-27 NOTE — Telephone Encounter (Signed)
Patient Advocate Encounter   Received notification from [facility] that prior authorization for Sherryll Burger is required.   PA submitted on CoverMyMeds Key [id number] Status is pending   Will continue to follow.

## 2020-07-27 NOTE — Telephone Encounter (Signed)
Advanced Heart Failure Patient Advocate Encounter  Prior Authorization for Sherryll Burger has been approved.    PA# [id number] Effective dates: 07/27/20 through 07/27/21  Archer Asa, CPhT

## 2020-08-02 ENCOUNTER — Other Ambulatory Visit (HOSPITAL_COMMUNITY): Payer: Self-pay

## 2020-08-02 DIAGNOSIS — I5042 Chronic combined systolic (congestive) and diastolic (congestive) heart failure: Secondary | ICD-10-CM

## 2020-08-03 ENCOUNTER — Other Ambulatory Visit: Payer: Self-pay

## 2020-08-03 ENCOUNTER — Ambulatory Visit (HOSPITAL_COMMUNITY)
Admission: RE | Admit: 2020-08-03 | Discharge: 2020-08-03 | Disposition: A | Discharge status: Home / Self Care | Payer: Medicaid Other | Source: Ambulatory Visit | Attending: Cardiology | Admitting: Cardiology

## 2020-08-03 DIAGNOSIS — I5042 Chronic combined systolic (congestive) and diastolic (congestive) heart failure: Secondary | ICD-10-CM | POA: Diagnosis not present

## 2020-08-03 LAB — BASIC METABOLIC PANEL
Anion gap: 9 (ref 5–15)
BUN: 10 mg/dL (ref 6–20)
CO2: 28 mmol/L (ref 22–32)
Calcium: 8.2 mg/dL — ABNORMAL LOW (ref 8.9–10.3)
Chloride: 101 mmol/L (ref 98–111)
Creatinine, Ser: 0.64 mg/dL (ref 0.44–1.00)
GFR, Estimated: >60 mL/min (ref >60)
Glucose, Bld: 202 mg/dL — ABNORMAL HIGH (ref 70–99)
Potassium: 3.9 mmol/L (ref 3.5–5.1)
Sodium: 138 mmol/L (ref 135–145)

## 2020-08-04 ENCOUNTER — Encounter (HOSPITAL_COMMUNITY): Payer: Self-pay

## 2020-08-09 ENCOUNTER — Inpatient Hospital Stay (HOSPITAL_COMMUNITY): Admission: RE | Admit: 2020-08-09 | Payer: Medicaid Other | Source: Ambulatory Visit

## 2020-08-16 ENCOUNTER — Inpatient Hospital Stay (HOSPITAL_COMMUNITY)
Admission: RE | Admit: 2020-08-16 | Discharge: 2020-08-16 | Disposition: A | Discharge status: Home / Self Care | Payer: Medicaid Other | Source: Ambulatory Visit

## 2020-09-04 ENCOUNTER — Encounter (HOSPITAL_COMMUNITY): Payer: Medicaid Other

## 2020-09-10 NOTE — Progress Notes (Incomplete)
Advanced Heart Failure Clinic Note  Primary Physician Evelene Croon, MD Primary Cardiologist: Dr. Gala Romney Electrophysiologist: None   Reason for Visit/CC: f/u for chronic systolic HF/ NICM and HTN  HPI:  Ms.Carrizalesis a 35 year old hispanic female with a past medical history of hypertension and poorly controlled type 2 diabetes mellitus, recently admitted to the hospital 03/2019 w/ SOB and diagnosed w/ CHF. In the ED, she was tachycardic and hypertensive. BNP 1,418.4. DM poorly controlled w/ Hgb A1c >13. She was diuresed w/ IV Lasix and started on DM medications. 2D echo showed severely reduced LVEF at 10-15% and moderately reduced RV. RHC/LHC  03/25/19 showed no CAD. Well compensated hemodynamics with low filling pressures. 03/25/19 cMRI w/ EF at 15% and no significant LGE. Started on guidelines directed medical therapy>>Entresto, Jardiance, spironolactone and digoxin. Lasix was discontinued. At f/u visit, Coreg was added to regimen for tachycardia and HTN.  She was seen back in [facility] 04/2019 for f/u and was doing fairly well but unfortunately was lost to f/u after that. She has been seen several times by general cardiology for readmits for CHF in the past year.   Per records, she had COVID in 08/2019.  She was admitted 4/21 w/ PE w/ pulmonary infarction and started on Eliquis. Her most recent admit was 11/21 for CHF. Per notes, she had lost insurance for a period of time and was no longer on Entresto nor Jardiance. At recent d/c, she was sent home on Coreg, lisinopril, and Aldactone and oral Lasix 20 mg daily. Eliquis was discontinued.   She returned 12/21 to [facility] for f/u. She was volume overloaded. ReDs Clip elevated at 43%. She was considered for At Home Trial, but she did not qualify for enrollment (has not been on 40 mg of lasix for the past 4 weeks). Not ideal to restart Jardiance given poorly controlled hgb A1c at 13.4. Entresto started and lasix increased to 60 mg daily this  visit.  Today she returns for HF follow up. Overall feeling fine. Denies increasing SOB, CP, dizziness, edema, or PND/Orthopnea. Appetite ok. No fever or chills. Weight at home 170 pounds. Taking all medications.      Cardiac Studies  Echo 03/23/19: EF 10-15%, restrictive filling, LV severely dilated, RV mod reduced, RVSP 33.7 mmHg Echo 12/20/2019: EF 25-30%, LV sev decrease function, +global hypokinesis, Grade II DD, RV mildly reduced   R/LHC 03/25/19:  Findings:  Ao = 122/88 (104) LV = 125/15 RA = 2 RV = 35/6 PA = 37/14 (25) PCW = 18 Fick cardiac output/index = 5.3/3.2 SVR = 1543 PVR = 1.3 WU Ao sat = 99% PA sat = 74%, 76%  Assessment: 1. Normal coronary arteries 2. 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 Well-compensated hemodynamics    ROS: All systems reviewed and negative except as per HPI.   No outpatient medications have been marked as taking for the 09/11/20 encounter (Appointment) with [facility] PA/NP.   Allergies  Allergen Reactions  . Ibuprofen Anaphylaxis   Past Medical History:  Diagnosis Date  . CHF (congestive heart failure) (HCC)   . Chronic back pain   . Chronic combined systolic and diastolic congestive heart failure (HCC) 09/07/2019  . Community acquired pneumonia 12/16/2019  . Diabetes mellitus without complication (HCC)   . History of COVID-19 11/07/2019  . Migraines   . No pertinent past medical history   . Pregnancy induced hypertension   . Pulmonary embolus (HCC) 12/16/2019  . Pulmonary infarct (HCC) 12/17/2019   Family History  Problem  Relation Age of Onset  . Hypertension Mother   . Anesthesia problems Neg Hx    Past Surgical History:  Procedure Laterality Date  . CERVICAL BIOPSY  W/ LOOP ELECTRODE EXCISION  2009  . Laparoscopic tubal sterilization with Falope rings.  2009  . RIGHT/LEFT HEART CATH AND CORONARY ANGIOGRAPHY N/A 03/25/2019   Procedure: RIGHT/LEFT HEART CATH AND CORONARY ANGIOGRAPHY;  Surgeon: Dolores Patty, MD;  Location: [facility] INVASIVE CV  LAB;  Service: Cardiovascular;  Laterality: N/A;  . TUBAL LIGATION     Social History   Socioeconomic History  . Marital status: Married    Spouse name: Not on file  . Number of children: 4  . Years of education: Not on file  . Highest education level: Not on file  Occupational History  . Not on file  Tobacco Use  . Smoking status: Never Smoker  . Smokeless tobacco: Never Used  Vaping Use  . Vaping Use: Never used  Substance and Sexual Activity  . Alcohol use: No    Alcohol/week: 6.0 standard drinks    Types: 6 Shots of liquor per week  . Drug use: No    Types: Marijuana  . Sexual activity: Not Currently    Birth control/protection: Surgical    Comment: band placed around tubes in 2009 surgically by Dr. Emelda Fear  Other Topics Concern  . Not on file  Social History Narrative  . Not on file   Social Determinants of Health   Financial Resource Strain: Not on file  Food Insecurity: Not on file  Transportation Needs: Not on file  Physical Activity: Not on file  Stress: Not on file  Social Connections: Not on file  Intimate Partner Violence: Not on file    Lipid Panel     Component Value Date/Time   CHOL 135 07/10/2020 0550   TRIG 100 07/10/2020 0550   HDL 40 (L) 07/10/2020 0550   CHOLHDL 3.4 07/10/2020 0550   VLDL 20 07/10/2020 0550   LDLCALC 75 07/10/2020 0550     There were no vitals taken for this visit.  There were no vitals filed for this visit. There is no height or weight on file to calculate BMI.   Physical Exam:  General:  Well appearing. No respiratory difficulty HEENT: normal Neck: supple. JVD 10 cm. Carotids 2+ bilat; no bruits. No lymphadenopathy or thyromegaly appreciated. Cor: PMI nondisplaced. Regular rate & rhythm. No rubs, gallops or murmurs. Lungs: clear, no wheezing  Abdomen: soft, nontender, nondistended. No hepatosplenomegaly. No bruits or masses. Good bowel sounds. Extremities: no cyanosis, clubbing, rash, trace bilateral LE  edema Neuro: alert & oriented x 3, cranial nerves grossly intact. moves all 4 extremities w/o difficulty. Affect pleasant.  EKG: not performed today  ReDs:   ASSESSMENT AND PLAN:   1. Chronic Systolic HF/ NICM: 2D echo 03/2019 showed severely reduced LVEF at 10-15% and moderately reduced RV. RHC/LHC showed no CAD. Well compensated hemodynamics with low filling pressures. cMRI w/ EF at 15% and no significant LGE. Echo 5/21 EF 25-30%. RV mildly reduced  - Suspect CM likely caused by poorly controlled HTN and DM. - She is fluid overloaded on exam today and symptomatic, NYHA Class II-III.  - Continue lasix to 60 mg daily.  - Increase Entresto 49/51 mg bid.  - Continue Spironolactone 25 mg daily. - Continue Coreg 6.25 mg bid. - Will hold off on restarting Jardiance at this time given Hgb A1c 13 and risk for GU infections.  - Check BMET today;  repeat 7-10 days. - Continue daily weights and encouraged low sodium diet. - Will ultimately need repeat echocardiogram, 3 months after treatment w/ maximumly tolerated HF regimen w/ intent to refer to EP for ICD if EF remains < 35%.   2. HTN:  - Controlled on current regimen.  - BMET today.  3. Poorly Controlled T2DM: recent hgb a1c 13.4 - Previously on Jardiance but no longer on due to cost. She now has new insurance. Would avoid restarting for now given elevated Hgb A1c and risk for GU infection.  - Continue Insulin + Metformin.  - Further management per PCP/ endocrinologist.   4. H/o COVID 19  - 08/2019  5. H/o PE w/ Pulmonary Infarct - Diagnosed 11/2019 - Treated w/ Eliquis x 6 months.   F/u w/ PharmD in 2 weeks for further titration of Entresto. F/u w/ APP in 4-6 weeks.   Anderson Malta [facility] FNP-BC [facility] 09/10/2020 2:08 PM

## 2020-09-11 ENCOUNTER — Encounter (HOSPITAL_COMMUNITY): Payer: Medicaid Other

## 2020-09-11 ENCOUNTER — Telehealth (HOSPITAL_COMMUNITY): Payer: Self-pay | Admitting: Cardiology

## 2020-09-11 NOTE — Telephone Encounter (Signed)
Call reminder placed for upcoming appt Details reviewed with patient

## 2021-06-03 ENCOUNTER — Emergency Department: Payer: Medicaid Other

## 2021-06-03 ENCOUNTER — Emergency Department
Admission: EM | Admit: 2021-06-03 | Discharge: 2021-06-03 | Disposition: A | Discharge status: Home / Self Care | Payer: Medicaid Other | Attending: Emergency Medicine | Admitting: Emergency Medicine

## 2021-06-03 ENCOUNTER — Other Ambulatory Visit: Payer: Self-pay

## 2021-06-03 DIAGNOSIS — Z8616 Personal history of COVID-19: Secondary | ICD-10-CM | POA: Insufficient documentation

## 2021-06-03 DIAGNOSIS — Z794 Long term (current) use of insulin: Secondary | ICD-10-CM | POA: Diagnosis not present

## 2021-06-03 DIAGNOSIS — I110 Hypertensive heart disease with heart failure: Secondary | ICD-10-CM | POA: Insufficient documentation

## 2021-06-03 DIAGNOSIS — I5043 Acute on chronic combined systolic (congestive) and diastolic (congestive) heart failure: Secondary | ICD-10-CM | POA: Diagnosis not present

## 2021-06-03 DIAGNOSIS — J189 Pneumonia, unspecified organism: Secondary | ICD-10-CM | POA: Diagnosis not present

## 2021-06-03 DIAGNOSIS — J181 Lobar pneumonia, unspecified organism: Secondary | ICD-10-CM | POA: Diagnosis not present

## 2021-06-03 DIAGNOSIS — R059 Cough, unspecified: Secondary | ICD-10-CM | POA: Diagnosis not present

## 2021-06-03 DIAGNOSIS — Z7984 Long term (current) use of oral hypoglycemic drugs: Secondary | ICD-10-CM | POA: Diagnosis not present

## 2021-06-03 DIAGNOSIS — Z79899 Other long term (current) drug therapy: Secondary | ICD-10-CM | POA: Diagnosis not present

## 2021-06-03 DIAGNOSIS — E119 Type 2 diabetes mellitus without complications: Secondary | ICD-10-CM | POA: Insufficient documentation

## 2021-06-03 DIAGNOSIS — R6883 Chills (without fever): Secondary | ICD-10-CM | POA: Diagnosis not present

## 2021-06-03 LAB — URINALYSIS, COMPLETE (UACMP) WITH MICROSCOPIC
Bilirubin Urine: NEGATIVE
Glucose, UA: >=500 mg/dL — AB
Hgb urine dipstick: NEGATIVE
Ketones, ur: 20 mg/dL — AB
Nitrite: NEGATIVE
Protein, ur: 100 mg/dL — AB
Specific Gravity, Urine: 1.035 — ABNORMAL HIGH (ref 1.005–1.030)
pH: 7 (ref 5.0–8.0)

## 2021-06-03 LAB — COMPREHENSIVE METABOLIC PANEL
ALT: 10 U/L (ref 0–44)
AST: 32 U/L (ref 15–41)
Albumin: 2.6 g/dL — ABNORMAL LOW (ref 3.5–5.0)
Alkaline Phosphatase: 127 U/L — ABNORMAL HIGH (ref 38–126)
Anion gap: 13 (ref 5–15)
BUN: 6 mg/dL (ref 6–20)
CO2: 27 mmol/L (ref 22–32)
Calcium: 8 mg/dL — ABNORMAL LOW (ref 8.9–10.3)
Chloride: 89 mmol/L — ABNORMAL LOW (ref 98–111)
Creatinine, Ser: 0.57 mg/dL (ref 0.44–1.00)
GFR, Estimated: >60 mL/min (ref >60)
Glucose, Bld: 460 mg/dL — ABNORMAL HIGH (ref 70–99)
Potassium: 3.3 mmol/L — ABNORMAL LOW (ref 3.5–5.1)
Sodium: 129 mmol/L — ABNORMAL LOW (ref 135–145)
Total Bilirubin: 1 mg/dL (ref 0.3–1.2)
Total Protein: 6.9 g/dL (ref 6.5–8.1)

## 2021-06-03 LAB — CBC
HCT: 36.1 % (ref 36.0–46.0)
Hemoglobin: 13 g/dL (ref 12.0–15.0)
MCH: 31.7 pg (ref 26.0–34.0)
MCHC: 36 g/dL (ref 30.0–36.0)
MCV: 88 fL (ref 80.0–100.0)
Platelets: 255 10*3/uL (ref 150–400)
RBC: 4.1 MIL/uL (ref 3.87–5.11)
RDW: 13.2 % (ref 11.5–15.5)
WBC: 12.3 10*3/uL — ABNORMAL HIGH (ref 4.0–10.5)
nRBC: 0 % (ref 0.0–0.2)

## 2021-06-03 LAB — POC URINE PREG, ED: Preg Test, Ur: NEGATIVE

## 2021-06-03 LAB — LIPASE, BLOOD: Lipase: 37 U/L (ref 11–51)

## 2021-06-03 MED ORDER — SODIUM CHLORIDE 0.9 % IV SOLN
1.0000 g | Freq: Once | INTRAVENOUS | Status: AC
  Administered 2021-06-03: 1 g via INTRAVENOUS
  Filled 2021-06-03: qty 10

## 2021-06-03 MED ORDER — AZITHROMYCIN 500 MG PO TABS: 500.0000 mg | ORAL_TABLET | Freq: Every day | ORAL | 0 refills | Status: AC

## 2021-06-03 MED ORDER — AMOXICILLIN 500 MG PO CAPS: 1000.0000 mg | ORAL_CAPSULE | Freq: Three times a day (TID) | ORAL | 0 refills | Status: AC

## 2021-06-03 MED ORDER — SODIUM CHLORIDE 0.9 % IV BOLUS
500.0000 mL | Freq: Once | INTRAVENOUS | Status: AC
  Administered 2021-06-03: 500 mL via INTRAVENOUS

## 2021-06-03 MED ORDER — TRAMADOL HCL 50 MG PO TABS
50.0000 mg | ORAL_TABLET | Freq: Once | ORAL | Status: AC
  Administered 2021-06-03: 50 mg via ORAL
  Filled 2021-06-03: qty 1

## 2021-06-03 NOTE — ED Provider Notes (Signed)
[facility] Emergency Department Provider Note   ____________________________________________    I have reviewed the triage vital signs and the nursing notes.   HISTORY  Chief Complaint Cough and Emesis     HPI Cheryl Mercer is a 35 y.o. female with history of CHF, diabetes, history of PE in the past not on anticoagulation who presents with complaints of cough, chills, fatigue, body aches and right upper chest discomfort.  No sick contacts reported.  Denies nasal congestion.  Has not take anything for this.  Symptoms been ongoing for about 3 to 4 days.  Past Medical History:  Diagnosis Date   CHF (congestive heart failure) (HCC)    Chronic back pain    Chronic combined systolic and diastolic congestive heart failure ([place]) 09/07/2019   Community acquired pneumonia 12/16/2019   Diabetes mellitus without complication ([place])    History of COVID-19 11/07/2019   Migraines    No pertinent past medical history    Pregnancy induced hypertension    Pulmonary embolus ([place]) 12/16/2019   Pulmonary infarct ([place]) 12/17/2019    Patient Active Problem List   Diagnosis Date Noted   Acute exacerbation of CHF (congestive heart failure) ([place]) 07/10/2020   Apnea    Pulmonary infarct ([place]) 12/17/2019   Pulmonary embolus ([place]) 12/16/2019   Community acquired pneumonia 12/16/2019   Dilated cardiomyopathy ([place]) 11/10/2019   History of COVID-19 11/07/2019   Acute on chronic combined systolic (congestive) and diastolic (congestive) heart failure ([place]) 11/07/2019   Elevated troponin 11/07/2019   Viral pneumonia    Tachycardia    Multifocal pneumonia 09/07/2019   Chronic combined systolic and diastolic congestive heart failure ([place]) 09/07/2019   Dyslipidemia 09/07/2019   Back pain 09/07/2019   Atypical pneumonia 09/07/2019   Essential hypertension 03/24/2019   Uncontrolled type 2 diabetes mellitus with hyperglycemia ([place]) 03/23/2019   Peripheral edema  03/23/2019   Cardiomegaly 03/23/2019   Acute on chronic combined systolic and diastolic CHF (congestive heart failure) ([place]) 03/23/2019   Proteinuria 03/23/2019   Acute bacterial tonsillitis 05/18/2013    Class: Acute   Lower urinary tract infectious disease 07/30/2012    Past Surgical History:  Procedure Laterality Date   CERVICAL BIOPSY  W/ LOOP ELECTRODE EXCISION  2009   Laparoscopic tubal sterilization with Falope rings.  2009   RIGHT/LEFT HEART CATH AND CORONARY ANGIOGRAPHY N/A 03/25/2019   Procedure: RIGHT/LEFT HEART CATH AND CORONARY ANGIOGRAPHY;  Surgeon: Jolaine Artist, MD;  Location: [place] CV LAB;  Service: Cardiovascular;  Laterality: N/A;   TUBAL LIGATION      Prior to Admission medications   Medication Sig Start Date End Date Taking? Authorizing Provider  amoxicillin (AMOXIL) 500 MG capsule Take 2 capsules (1,000 mg total) by mouth 3 (three) times daily for 7 days. 06/03/21 06/10/21 Yes Lavonia Drafts, MD  azithromycin (ZITHROMAX) 500 MG tablet Take 1 tablet (500 mg total) by mouth daily for 7 days. Take 1 tablet daily for 3 days. 06/03/21 06/10/21 Yes Lavonia Drafts, MD  atorvastatin (LIPITOR) 40 MG tablet Take 1 tablet (40 mg total) by mouth daily at 6 PM. 07/02/20   Isla Pence, MD  blood glucose meter kit and supplies Dispense based on patient and insurance preference. Check blood sugar 4 times daily, three times before meals and once before bedtime. (FOR ICD-10 E10.9, E11.9). 07/02/20   Isla Pence, MD  carvedilol (COREG) 6.25 MG tablet Take 1 tablet (6.25 mg total) by mouth 2 (two) times daily with a  meal. 07/11/20   Orson Eva, MD  furosemide (LASIX) 20 MG tablet Take 3 tablets (60 mg total) by mouth daily. 07/27/20   Bensimhon, Shaune Pascal, MD  gabapentin (NEURONTIN) 300 MG capsule Take 1 capsule (300 mg total) by mouth 3 (three) times daily. 07/02/20   Isla Pence, MD  insulin aspart (NOVOLOG FLEXPEN) 100 UNIT/ML FlexPen Inject 10 Units into the skin  3 (three) times daily with meals. 07/11/20   Orson Eva, MD  insulin glargine (LANTUS) 100 UNIT/ML Solostar Pen Inject 40 Units into the skin daily. 07/02/20   Isla Pence, MD  metFORMIN (GLUCOPHAGE) 500 MG tablet Take 1 tablet (500 mg total) by mouth 2 (two) times daily with a meal. 07/02/20   Isla Pence, MD  sacubitril-valsartan (ENTRESTO) 24-26 MG Take 1 tablet by mouth 2 (two) times daily. 07/24/20   Consuelo Pandy, PA-C  spironolactone (ALDACTONE) 25 MG tablet Take 1 tablet (25 mg total) by mouth daily. 07/12/20   Orson Eva, MD     Allergies Ibuprofen  Family History  Problem Relation Age of Onset   Hypertension Mother    Anesthesia problems Neg Hx     Social History Social History   Tobacco Use   Smoking status: Never   Smokeless tobacco: Never  Vaping Use   Vaping Use: Never used  Substance Use Topics   Alcohol use: No    Alcohol/week: 6.0 standard drinks    Types: 6 Shots of liquor per week   Drug use: No    Types: Marijuana    Review of Systems  Constitutional: Positive chills Eyes: No visual changes.  ENT: No sore throat. Cardiovascular: As above Respiratory: Positive cough Gastrointestinal: No abdominal pain.  No nausea, no vomiting.   Genitourinary: Negative for dysuria. Musculoskeletal: Negative for back pain. Skin: Negative for rash. Neurological: Negative for headaches or weakness   ____________________________________________   PHYSICAL EXAM:  VITAL SIGNS: ED Triage Vitals  Enc Vitals Group     BP 06/03/21 1055 (!) 149/104     Pulse Rate 06/03/21 1055 (!) 122     Resp 06/03/21 1055 (!) 22     Temp 06/03/21 1055 98.5 F (36.9 C)     Temp Source 06/03/21 1055 Oral     SpO2 06/03/21 1055 96 %     Weight 06/03/21 1056 65.8 kg (145 lb)     Height 06/03/21 1056 1.448 m (_0 )     Head Circumference --      Peak Flow --      Pain Score 06/03/21 1056 8     Pain Loc --      Pain Edu? --      Excl. in [place]? --      Constitutional: Alert and oriented. No acute distress. Pleasant and interactive  Nose: No congestion/rhinnorhea. Mouth/Throat: Mucous membranes are moist.   Neck:  Painless ROM Cardiovascular: Normal rate, regular rhythm.  Good peripheral circulation. Respiratory: Normal respiratory effort.  No retractions.  No wheezing Gastrointestinal: Soft and nontender. No distention.   Musculoskeletal: No lower extremity tenderness nor edema.  Warm and well perfused Neurologic:  Normal speech and language. No gross focal neurologic deficits are appreciated.  Skin:  Skin is warm, dry and intact. No rash noted. Psychiatric: Mood and affect are normal. Speech and behavior are normal.  ____________________________________________   LABS (all labs ordered are listed, but only abnormal results are displayed)  Labs Reviewed  COMPREHENSIVE METABOLIC PANEL - Abnormal; Notable for the following components:  Result Value   Sodium 129 (*)    Potassium 3.3 (*)    Chloride 89 (*)    Glucose, Bld 460 (*)    Calcium 8.0 (*)    Albumin 2.6 (*)    Alkaline Phosphatase 127 (*)    All other components within normal limits  CBC - Abnormal; Notable for the following components:   WBC 12.3 (*)    All other components within normal limits  URINALYSIS, COMPLETE (UACMP) WITH MICROSCOPIC - Abnormal; Notable for the following components:   Color, Urine YELLOW (*)    APPearance HAZY (*)    Specific Gravity, Urine 1.035 (*)    Glucose, UA >=500 (*)    Ketones, ur 20 (*)    Protein, ur 100 (*)    Leukocytes,Ua SMALL (*)    Bacteria, UA MANY (*)    All other components within normal limits  LIPASE, BLOOD  POC URINE PREG, ED   ____________________________________________  EKG  None ____________________________________________  RADIOLOGY  Chest x-ray reviewed by me, right upper lobe pneumonia noted ____________________________________________   PROCEDURES  Procedure(s) performed:  No  Procedures   Critical Care performed: No ____________________________________________   INITIAL IMPRESSION / ASSESSMENT AND PLAN / ED COURSE  Pertinent labs & imaging results that were available during my care of the patient were reviewed by me and considered in my medical decision making (see chart for details).   Patient presents with symptoms as noted above, suspicious for upper respiratory infection, viral versus bacterial.  COVID is a possibility.  Lab work notable for elevated glucose, normal anion gap, patient reports compliance with medications.  We will treat with IV fluids.  Also notable for elevated white blood cell count.  Chest x-ray demonstrates right upper lobe infiltrate, given elevated white blood cell count and infiltrate consistent with bacterial pneumonia, will give a dose of IV Rocephin here in the emergency department  Appropriate for discharge with outpatient antibiotics, strict return precautions discussed    ____________________________________________   FINAL CLINICAL IMPRESSION(S) / ED DIAGNOSES  Final diagnoses:  Community acquired pneumonia of right upper lobe of lung        Note:  This document was prepared using Dragon voice recognition software and may include unintentional dictation errors.    Lavonia Drafts, MD 06/03/21 [phone]

## 2021-06-03 NOTE — ED Triage Notes (Signed)
Pt states that she hasn't been feeling well for the past 3-4 days, states last pm was the worst, states that she has aches all over and in her bones, states coughing and vomiting and is asking for a blanket due to chills

## 2021-06-05 NOTE — ED Notes (Signed)
Son called to say that pharmacy will not give her rx due to conflicting directions.  Spoke with dr Cyril Loosen.  He wants the azithromycin 500 mg daily for 7 days.  Called [facility] [place] and informed to fill it for 7 days.

## 2021-06-13 ENCOUNTER — Encounter: Payer: Self-pay | Admitting: Radiology

## 2021-06-13 ENCOUNTER — Other Ambulatory Visit: Payer: Self-pay

## 2021-06-13 ENCOUNTER — Emergency Department: Payer: Medicaid Other

## 2021-06-13 DIAGNOSIS — I420 Dilated cardiomyopathy: Secondary | ICD-10-CM | POA: Diagnosis present

## 2021-06-13 DIAGNOSIS — R350 Frequency of micturition: Secondary | ICD-10-CM | POA: Diagnosis present

## 2021-06-13 DIAGNOSIS — G8929 Other chronic pain: Secondary | ICD-10-CM | POA: Diagnosis present

## 2021-06-13 DIAGNOSIS — I959 Hypotension, unspecified: Secondary | ICD-10-CM | POA: Diagnosis not present

## 2021-06-13 DIAGNOSIS — I110 Hypertensive heart disease with heart failure: Principal | ICD-10-CM | POA: Diagnosis present

## 2021-06-13 DIAGNOSIS — E785 Hyperlipidemia, unspecified: Secondary | ICD-10-CM | POA: Diagnosis present

## 2021-06-13 DIAGNOSIS — I5043 Acute on chronic combined systolic (congestive) and diastolic (congestive) heart failure: Secondary | ICD-10-CM | POA: Diagnosis present

## 2021-06-13 DIAGNOSIS — Z91199 Patient's noncompliance with other medical treatment and regimen due to unspecified reason: Secondary | ICD-10-CM

## 2021-06-13 DIAGNOSIS — T502X5A Adverse effect of carbonic-anhydrase inhibitors, benzothiadiazides and other diuretics, initial encounter: Secondary | ICD-10-CM | POA: Diagnosis present

## 2021-06-13 DIAGNOSIS — Z794 Long term (current) use of insulin: Secondary | ICD-10-CM

## 2021-06-13 DIAGNOSIS — Z86711 Personal history of pulmonary embolism: Secondary | ICD-10-CM

## 2021-06-13 DIAGNOSIS — E1165 Type 2 diabetes mellitus with hyperglycemia: Secondary | ICD-10-CM | POA: Diagnosis not present

## 2021-06-13 DIAGNOSIS — R079 Chest pain, unspecified: Secondary | ICD-10-CM | POA: Diagnosis not present

## 2021-06-13 DIAGNOSIS — I272 Pulmonary hypertension, unspecified: Secondary | ICD-10-CM | POA: Diagnosis present

## 2021-06-13 DIAGNOSIS — I517 Cardiomegaly: Secondary | ICD-10-CM | POA: Diagnosis not present

## 2021-06-13 DIAGNOSIS — Z8616 Personal history of COVID-19: Secondary | ICD-10-CM

## 2021-06-13 DIAGNOSIS — Z79899 Other long term (current) drug therapy: Secondary | ICD-10-CM

## 2021-06-13 DIAGNOSIS — R0789 Other chest pain: Secondary | ICD-10-CM | POA: Diagnosis not present

## 2021-06-13 DIAGNOSIS — Z8249 Family history of ischemic heart disease and other diseases of the circulatory system: Secondary | ICD-10-CM

## 2021-06-13 DIAGNOSIS — J9601 Acute respiratory failure with hypoxia: Secondary | ICD-10-CM | POA: Diagnosis present

## 2021-06-13 DIAGNOSIS — E871 Hypo-osmolality and hyponatremia: Secondary | ICD-10-CM | POA: Diagnosis present

## 2021-06-13 DIAGNOSIS — R609 Edema, unspecified: Secondary | ICD-10-CM | POA: Diagnosis not present

## 2021-06-13 DIAGNOSIS — Z7984 Long term (current) use of oral hypoglycemic drugs: Secondary | ICD-10-CM

## 2021-06-13 DIAGNOSIS — E559 Vitamin D deficiency, unspecified: Secondary | ICD-10-CM | POA: Diagnosis present

## 2021-06-13 DIAGNOSIS — Z9114 Patient's other noncompliance with medication regimen: Secondary | ICD-10-CM

## 2021-06-13 DIAGNOSIS — E876 Hypokalemia: Secondary | ICD-10-CM | POA: Diagnosis present

## 2021-06-13 DIAGNOSIS — J189 Pneumonia, unspecified organism: Secondary | ICD-10-CM | POA: Diagnosis not present

## 2021-06-13 LAB — CBC
HCT: 34.7 % — ABNORMAL LOW (ref 36.0–46.0)
Hemoglobin: 11.9 g/dL — ABNORMAL LOW (ref 12.0–15.0)
MCH: 30.8 pg (ref 26.0–34.0)
MCHC: 34.3 g/dL (ref 30.0–36.0)
MCV: 89.9 fL (ref 80.0–100.0)
Platelets: 558 10*3/uL — ABNORMAL HIGH (ref 150–400)
RBC: 3.86 MIL/uL — ABNORMAL LOW (ref 3.87–5.11)
RDW: 13.2 % (ref 11.5–15.5)
WBC: 10.4 10*3/uL (ref 4.0–10.5)
nRBC: 0 % (ref 0.0–0.2)

## 2021-06-13 LAB — COMPREHENSIVE METABOLIC PANEL
ALT: 14 U/L (ref 0–44)
AST: 28 U/L (ref 15–41)
Albumin: 2.6 g/dL — ABNORMAL LOW (ref 3.5–5.0)
Alkaline Phosphatase: 198 U/L — ABNORMAL HIGH (ref 38–126)
Anion gap: 8 (ref 5–15)
BUN: 5 mg/dL — ABNORMAL LOW (ref 6–20)
CO2: 29 mmol/L (ref 22–32)
Calcium: 8.5 mg/dL — ABNORMAL LOW (ref 8.9–10.3)
Chloride: 93 mmol/L — ABNORMAL LOW (ref 98–111)
Creatinine, Ser: 0.59 mg/dL (ref 0.44–1.00)
GFR, Estimated: >60 mL/min (ref >60)
Glucose, Bld: 550 mg/dL (ref 70–99)
Potassium: 3.9 mmol/L (ref 3.5–5.1)
Sodium: 130 mmol/L — ABNORMAL LOW (ref 135–145)
Total Bilirubin: 0.6 mg/dL (ref 0.3–1.2)
Total Protein: 7.5 g/dL (ref 6.5–8.1)

## 2021-06-13 LAB — BRAIN NATRIURETIC PEPTIDE: B Natriuretic Peptide: 953.8 pg/mL — ABNORMAL HIGH (ref 0.0–100.0)

## 2021-06-13 LAB — TROPONIN I (HIGH SENSITIVITY): Troponin I (High Sensitivity): 38 ng/L — ABNORMAL HIGH (ref <18)

## 2021-06-13 NOTE — ED Triage Notes (Signed)
Pt in with co chest pain for few days and pedal edema. Pt has hx or CHF but has been out of lasix and bp meds for few months.

## 2021-06-14 ENCOUNTER — Inpatient Hospital Stay
Admission: EM | Admit: 2021-06-14 | Discharge: 2021-06-16 | DRG: 291 | Disposition: A | Discharge status: Home / Self Care | Payer: Medicaid Other | Attending: Student | Admitting: Student

## 2021-06-14 ENCOUNTER — Emergency Department: Payer: Medicaid Other

## 2021-06-14 ENCOUNTER — Encounter: Payer: Self-pay | Admitting: Internal Medicine

## 2021-06-14 DIAGNOSIS — J189 Pneumonia, unspecified organism: Secondary | ICD-10-CM | POA: Diagnosis not present

## 2021-06-14 DIAGNOSIS — I10 Essential (primary) hypertension: Secondary | ICD-10-CM

## 2021-06-14 DIAGNOSIS — I959 Hypotension, unspecified: Secondary | ICD-10-CM | POA: Diagnosis not present

## 2021-06-14 DIAGNOSIS — J9601 Acute respiratory failure with hypoxia: Secondary | ICD-10-CM | POA: Diagnosis not present

## 2021-06-14 DIAGNOSIS — E559 Vitamin D deficiency, unspecified: Secondary | ICD-10-CM | POA: Diagnosis not present

## 2021-06-14 DIAGNOSIS — E876 Hypokalemia: Secondary | ICD-10-CM | POA: Diagnosis not present

## 2021-06-14 DIAGNOSIS — Z91148 Patient's other noncompliance with medication regimen for other reason: Secondary | ICD-10-CM

## 2021-06-14 DIAGNOSIS — I517 Cardiomegaly: Secondary | ICD-10-CM | POA: Diagnosis not present

## 2021-06-14 DIAGNOSIS — Z9114 Patient's other noncompliance with medication regimen: Secondary | ICD-10-CM | POA: Diagnosis not present

## 2021-06-14 DIAGNOSIS — I5043 Acute on chronic combined systolic (congestive) and diastolic (congestive) heart failure: Secondary | ICD-10-CM | POA: Diagnosis not present

## 2021-06-14 DIAGNOSIS — Z794 Long term (current) use of insulin: Secondary | ICD-10-CM | POA: Diagnosis not present

## 2021-06-14 DIAGNOSIS — R739 Hyperglycemia, unspecified: Secondary | ICD-10-CM

## 2021-06-14 DIAGNOSIS — I509 Heart failure, unspecified: Secondary | ICD-10-CM | POA: Insufficient documentation

## 2021-06-14 DIAGNOSIS — J811 Chronic pulmonary edema: Secondary | ICD-10-CM | POA: Diagnosis not present

## 2021-06-14 DIAGNOSIS — R7989 Other specified abnormal findings of blood chemistry: Secondary | ICD-10-CM | POA: Diagnosis not present

## 2021-06-14 DIAGNOSIS — R350 Frequency of micturition: Secondary | ICD-10-CM | POA: Diagnosis not present

## 2021-06-14 DIAGNOSIS — R609 Edema, unspecified: Secondary | ICD-10-CM

## 2021-06-14 DIAGNOSIS — Z79899 Other long term (current) drug therapy: Secondary | ICD-10-CM | POA: Diagnosis not present

## 2021-06-14 DIAGNOSIS — E871 Hypo-osmolality and hyponatremia: Secondary | ICD-10-CM | POA: Diagnosis present

## 2021-06-14 DIAGNOSIS — Z8249 Family history of ischemic heart disease and other diseases of the circulatory system: Secondary | ICD-10-CM | POA: Diagnosis not present

## 2021-06-14 DIAGNOSIS — I110 Hypertensive heart disease with heart failure: Secondary | ICD-10-CM | POA: Diagnosis not present

## 2021-06-14 DIAGNOSIS — I420 Dilated cardiomyopathy: Secondary | ICD-10-CM | POA: Diagnosis not present

## 2021-06-14 DIAGNOSIS — I272 Pulmonary hypertension, unspecified: Secondary | ICD-10-CM | POA: Diagnosis not present

## 2021-06-14 DIAGNOSIS — I2699 Other pulmonary embolism without acute cor pulmonale: Secondary | ICD-10-CM | POA: Diagnosis not present

## 2021-06-14 DIAGNOSIS — E119 Type 2 diabetes mellitus without complications: Secondary | ICD-10-CM | POA: Diagnosis present

## 2021-06-14 DIAGNOSIS — Z91199 Patient's noncompliance with other medical treatment and regimen due to unspecified reason: Secondary | ICD-10-CM | POA: Diagnosis not present

## 2021-06-14 DIAGNOSIS — R0789 Other chest pain: Secondary | ICD-10-CM | POA: Diagnosis not present

## 2021-06-14 DIAGNOSIS — R079 Chest pain, unspecified: Secondary | ICD-10-CM | POA: Diagnosis not present

## 2021-06-14 DIAGNOSIS — E1165 Type 2 diabetes mellitus with hyperglycemia: Secondary | ICD-10-CM | POA: Diagnosis not present

## 2021-06-14 DIAGNOSIS — Z8616 Personal history of COVID-19: Secondary | ICD-10-CM | POA: Diagnosis not present

## 2021-06-14 DIAGNOSIS — Z7984 Long term (current) use of oral hypoglycemic drugs: Secondary | ICD-10-CM | POA: Diagnosis not present

## 2021-06-14 DIAGNOSIS — G8929 Other chronic pain: Secondary | ICD-10-CM | POA: Diagnosis not present

## 2021-06-14 DIAGNOSIS — I5023 Acute on chronic systolic (congestive) heart failure: Secondary | ICD-10-CM | POA: Diagnosis not present

## 2021-06-14 DIAGNOSIS — E785 Hyperlipidemia, unspecified: Secondary | ICD-10-CM | POA: Diagnosis not present

## 2021-06-14 DIAGNOSIS — T502X5A Adverse effect of carbonic-anhydrase inhibitors, benzothiadiazides and other diuretics, initial encounter: Secondary | ICD-10-CM | POA: Diagnosis not present

## 2021-06-14 DIAGNOSIS — J8489 Other specified interstitial pulmonary diseases: Secondary | ICD-10-CM

## 2021-06-14 DIAGNOSIS — Z86711 Personal history of pulmonary embolism: Secondary | ICD-10-CM | POA: Diagnosis not present

## 2021-06-14 LAB — PROCALCITONIN: Procalcitonin: <0.1 ng/mL

## 2021-06-14 LAB — CBG MONITORING, ED
Glucose-Capillary: 477 mg/dL — ABNORMAL HIGH (ref 70–99)
Glucose-Capillary: 314 mg/dL — ABNORMAL HIGH (ref 70–99)
Glucose-Capillary: 141 mg/dL — ABNORMAL HIGH (ref 70–99)
Glucose-Capillary: 269 mg/dL — ABNORMAL HIGH (ref 70–99)
Glucose-Capillary: 254 mg/dL — ABNORMAL HIGH (ref 70–99)

## 2021-06-14 LAB — CBC
HCT: 34.8 % — ABNORMAL LOW (ref 36.0–46.0)
Hemoglobin: 12.4 g/dL (ref 12.0–15.0)
MCH: 31.3 pg (ref 26.0–34.0)
MCHC: 35.6 g/dL (ref 30.0–36.0)
MCV: 87.9 fL (ref 80.0–100.0)
Platelets: 591 10*3/uL — ABNORMAL HIGH (ref 150–400)
RBC: 3.96 MIL/uL (ref 3.87–5.11)
RDW: 13 % (ref 11.5–15.5)
WBC: 13.5 10*3/uL — ABNORMAL HIGH (ref 4.0–10.5)
nRBC: 0 % (ref 0.0–0.2)

## 2021-06-14 LAB — TROPONIN I (HIGH SENSITIVITY): Troponin I (High Sensitivity): 41 ng/L — ABNORMAL HIGH (ref <18)

## 2021-06-14 LAB — CREATININE, SERUM
Creatinine, Ser: 0.46 mg/dL (ref 0.44–1.00)
GFR, Estimated: >60 mL/min (ref >60)

## 2021-06-14 LAB — MRSA NEXT GEN BY PCR, NASAL: MRSA by PCR Next Gen: DETECTED — AB

## 2021-06-14 LAB — D-DIMER, QUANTITATIVE: D-Dimer, Quant: 1.8 ug/mL-FEU — ABNORMAL HIGH (ref 0.00–0.50)

## 2021-06-14 LAB — C-REACTIVE PROTEIN: CRP: 2.1 mg/dL — ABNORMAL HIGH (ref <1.0)

## 2021-06-14 LAB — HIV ANTIBODY (ROUTINE TESTING W REFLEX): HIV Screen 4th Generation wRfx: NONREACTIVE

## 2021-06-14 MED ORDER — GABAPENTIN 300 MG PO CAPS: 300.0000 mg | ORAL_CAPSULE | Freq: Three times a day (TID) | ORAL | Status: DC

## 2021-06-14 MED ORDER — ONDANSETRON HCL 4 MG PO TABS: 4.0000 mg | ORAL_TABLET | Freq: Four times a day (QID) | ORAL | Status: DC | PRN

## 2021-06-14 MED ORDER — SACUBITRIL-VALSARTAN 24-26 MG PO TABS
1.0000 | ORAL_TABLET | Freq: Two times a day (BID) | ORAL | Status: DC
  Administered 2021-06-14 – 2021-06-16 (×5): 1 via ORAL
  Filled 2021-06-14 (×6): qty 1

## 2021-06-14 MED ORDER — SPIRONOLACTONE 25 MG PO TABS
25.0000 mg | ORAL_TABLET | Freq: Every day | ORAL | Status: DC
  Administered 2021-06-14 – 2021-06-16 (×3): 25 mg via ORAL
  Filled 2021-06-14 (×3): qty 1

## 2021-06-14 MED ORDER — ACETAMINOPHEN 325 MG PO TABS
650.0000 mg | ORAL_TABLET | Freq: Four times a day (QID) | ORAL | Status: DC | PRN
  Administered 2021-06-14 – 2021-06-16 (×3): 650 mg via ORAL
  Filled 2021-06-14 (×3): qty 2

## 2021-06-14 MED ORDER — ONDANSETRON HCL 4 MG/2ML IJ SOLN: 4.0000 mg | Freq: Four times a day (QID) | INTRAMUSCULAR | Status: DC | PRN

## 2021-06-14 MED ORDER — INSULIN GLARGINE-YFGN 100 UNIT/ML ~~LOC~~ SOLN
40.0000 [IU] | Freq: Once | SUBCUTANEOUS | Status: AC
  Administered 2021-06-14: 40 [IU] via SUBCUTANEOUS
  Filled 2021-06-14: qty 0.4

## 2021-06-14 MED ORDER — INSULIN ASPART 100 UNIT/ML IJ SOLN
10.0000 [IU] | Freq: Once | INTRAMUSCULAR | Status: AC
  Administered 2021-06-14: 10 [IU] via SUBCUTANEOUS
  Filled 2021-06-14: qty 1

## 2021-06-14 MED ORDER — INSULIN GLARGINE-YFGN 100 UNIT/ML ~~LOC~~ SOLN
40.0000 [IU] | Freq: Every day | SUBCUTANEOUS | Status: DC
  Administered 2021-06-15 (×2): 40 [IU] via SUBCUTANEOUS
  Filled 2021-06-14 (×3): qty 0.4

## 2021-06-14 MED ORDER — ATORVASTATIN CALCIUM 20 MG PO TABS
40.0000 mg | ORAL_TABLET | Freq: Every day | ORAL | Status: DC
  Administered 2021-06-15 (×2): 40 mg via ORAL
  Filled 2021-06-14 (×2): qty 2

## 2021-06-14 MED ORDER — METFORMIN HCL 500 MG PO TABS
500.0000 mg | ORAL_TABLET | Freq: Once | ORAL | Status: AC
  Administered 2021-06-14: 500 mg via ORAL
  Filled 2021-06-14: qty 1

## 2021-06-14 MED ORDER — ENOXAPARIN SODIUM 40 MG/0.4ML IJ SOSY
40.0000 mg | PREFILLED_SYRINGE | INTRAMUSCULAR | Status: DC
  Administered 2021-06-14 – 2021-06-16 (×3): 40 mg via SUBCUTANEOUS
  Filled 2021-06-14 (×3): qty 0.4

## 2021-06-14 MED ORDER — SACUBITRIL-VALSARTAN 24-26 MG PO TABS
1.0000 | ORAL_TABLET | Freq: Once | ORAL | Status: AC
  Administered 2021-06-14: 1 via ORAL
  Filled 2021-06-14: qty 1

## 2021-06-14 MED ORDER — INSULIN ASPART 100 UNIT/ML IJ SOLN
0.0000 [IU] | Freq: Three times a day (TID) | INTRAMUSCULAR | Status: DC
  Administered 2021-06-14 (×2): 8 [IU] via SUBCUTANEOUS
  Administered 2021-06-15: 15 [IU] via SUBCUTANEOUS
  Administered 2021-06-15: 5 [IU] via SUBCUTANEOUS
  Administered 2021-06-15 – 2021-06-16 (×2): 3 [IU] via SUBCUTANEOUS
  Administered 2021-06-16: 8 [IU] via SUBCUTANEOUS
  Filled 2021-06-14 (×7): qty 1

## 2021-06-14 MED ORDER — CARVEDILOL 6.25 MG PO TABS
6.2500 mg | ORAL_TABLET | Freq: Two times a day (BID) | ORAL | Status: DC
  Administered 2021-06-14 – 2021-06-16 (×4): 6.25 mg via ORAL
  Filled 2021-06-14 (×4): qty 1

## 2021-06-14 MED ORDER — SPIRONOLACTONE 25 MG PO TABS
25.0000 mg | ORAL_TABLET | Freq: Once | ORAL | Status: AC
  Administered 2021-06-14: 25 mg via ORAL
  Filled 2021-06-14: qty 1

## 2021-06-14 MED ORDER — IOHEXOL 350 MG/ML SOLN
75.0000 mL | Freq: Once | INTRAVENOUS | Status: AC | PRN
  Administered 2021-06-14: 75 mL via INTRAVENOUS
  Filled 2021-06-14: qty 75

## 2021-06-14 MED ORDER — FUROSEMIDE 10 MG/ML IJ SOLN
40.0000 mg | Freq: Once | INTRAMUSCULAR | Status: AC
  Administered 2021-06-14: 40 mg via INTRAVENOUS
  Filled 2021-06-14: qty 4

## 2021-06-14 MED ORDER — CARVEDILOL 6.25 MG PO TABS
6.2500 mg | ORAL_TABLET | Freq: Once | ORAL | Status: AC
  Administered 2021-06-14: 6.25 mg via ORAL
  Filled 2021-06-14: qty 1

## 2021-06-14 MED ORDER — FUROSEMIDE 10 MG/ML IJ SOLN
40.0000 mg | Freq: Every day | INTRAMUSCULAR | Status: DC
  Administered 2021-06-15 – 2021-06-16 (×2): 40 mg via INTRAVENOUS
  Filled 2021-06-14 (×2): qty 4

## 2021-06-14 MED ORDER — ACETAMINOPHEN 650 MG RE SUPP
650.0000 mg | Freq: Four times a day (QID) | RECTAL | Status: DC | PRN
  Filled 2021-06-14: qty 1

## 2021-06-14 NOTE — Consult Note (Signed)
Pulmonary Medicine          Date: 06/14/2021,   MRN# [id number] Cheryl Mercer 01/31/1986     AdmissionWeight: 64.9 kg                 CurrentWeight: 64.9 kg   Refferring physician Dr Francine Graven   CHIEF COMPLAINT:   Pneumonia   HISTORY OF PRESENT ILLNESS   This is a pleasant 35 yo with CHF, DM, chronic lumbago hx of PE medical noncompliance who came in due to chest pain for 1wk with worsening SOB. She shares therapy prescribed previously is cost prohibitive.  She completed treatment with antibiotics for community acquired pneumonia. She is admitted with pneumonia and PCCM is consulted for further evaluation and management of organizing pneumonia. CT angio was done and reviewed by me with findings of pulmonary edema and bilateral airspace opacification suggestive of possible infectious etiology.     PAST MEDICAL HISTORY   Past Medical History:  Diagnosis Date   CHF (congestive heart failure) (HCC)    Chronic back pain    Chronic combined systolic and diastolic congestive heart failure ([place]) 09/07/2019   Community acquired pneumonia 12/16/2019   Diabetes mellitus without complication ([place])    History of COVID-19 11/07/2019   Migraines    No pertinent past medical history    Pregnancy induced hypertension    Pulmonary embolus ([place]) 12/16/2019   Pulmonary infarct ([place]) 12/17/2019     SURGICAL HISTORY   Past Surgical History:  Procedure Laterality Date   CERVICAL BIOPSY  W/ LOOP ELECTRODE EXCISION  2009   Laparoscopic tubal sterilization with Falope rings.  2009   RIGHT/LEFT HEART CATH AND CORONARY ANGIOGRAPHY N/A 03/25/2019   Procedure: RIGHT/LEFT HEART CATH AND CORONARY ANGIOGRAPHY;  Surgeon: Jolaine Artist, MD;  Location: [facility];  Service: Cardiovascular;  Laterality: N/A;   TUBAL LIGATION       FAMILY HISTORY   Family History  Problem Relation Age of Onset   Hypertension Mother    Anesthesia problems Neg Hx      SOCIAL HISTORY    Social History   Tobacco Use   Smoking status: Never   Smokeless tobacco: Never  Vaping Use   Vaping Use: Never used  Substance Use Topics   Alcohol use: No    Alcohol/week: 6.0 standard drinks    Types: 6 Shots of liquor per week   Drug use: No    Types: Marijuana     MEDICATIONS    Home Medication:  Current Outpatient Rx   Order #: [id number] Class: Normal   Order #: [id number] Class: Print   Order #: [id number] Class: Normal   Order #: [id number] Class: Normal   Order #: [id number] Class: Normal   Order #: [id number] Class: Normal   Order #: [id number] Class: Normal   Order #: [id number] Class: Normal   Order #: [id number] Class: Normal   Order #: [id number] Class: Normal    Current Medication:  Current Facility-Administered Medications:    acetaminophen (TYLENOL) tablet 650 mg, 650 mg, Oral, Q6H PRN **OR** acetaminophen (TYLENOL) suppository 650 mg, 650 mg, Rectal, Q6H PRN, Agbata, Tochukwu, MD   atorvastatin (LIPITOR) tablet 40 mg, 40 mg, Oral, q1800, Agbata, Tochukwu, MD   carvedilol (COREG) tablet 6.25 mg, 6.25 mg, Oral, BID WC, Agbata, Tochukwu, MD   enoxaparin (LOVENOX) injection 40 mg, 40 mg, Subcutaneous, Q24H, Agbata, Tochukwu, MD   [START ON 06/15/2021] furosemide (LASIX) injection 40 mg, 40 mg, Intravenous, Daily, Agbata, Tochukwu, MD   gabapentin (NEURONTIN) capsule 300  mg, 300 mg, Oral, TID, Agbata, Tochukwu, MD   insulin aspart (novoLOG) injection 0-15 Units, 0-15 Units, Subcutaneous, TID WC, Agbata, Tochukwu, MD   insulin glargine (LANTUS) Solostar Pen 40 Units, 40 Units, Subcutaneous, Daily, Agbata, Tochukwu, MD   ondansetron (ZOFRAN) tablet 4 mg, 4 mg, Oral, Q6H PRN **OR** ondansetron (ZOFRAN) injection 4 mg, 4 mg, Intravenous, Q6H PRN, Agbata, Tochukwu, MD   sacubitril-valsartan (ENTRESTO) 24-26 mg per tablet, 1 tablet, Oral, BID, Agbata, Tochukwu, MD   spironolactone (ALDACTONE) tablet 25 mg, 25 mg, Oral, Daily, Agbata, Tochukwu, MD  Current Outpatient  Medications:    atorvastatin (LIPITOR) 40 MG tablet, Take 1 tablet (40 mg total) by mouth daily at 6 PM., Disp: 30 tablet, Rfl: 0   blood glucose meter kit and supplies, Dispense based on patient and insurance preference. Check blood sugar 4 times daily, three times before meals and once before bedtime. (FOR ICD-10 E10.9, E11.9)., Disp: 1 each, Rfl: 0   carvedilol (COREG) 6.25 MG tablet, Take 1 tablet (6.25 mg total) by mouth 2 (two) times daily with a meal., Disp: 60 tablet, Rfl: 1   furosemide (LASIX) 20 MG tablet, Take 3 tablets (60 mg total) by mouth daily., Disp: 90 tablet, Rfl: 11   gabapentin (NEURONTIN) 300 MG capsule, Take 1 capsule (300 mg total) by mouth 3 (three) times daily., Disp: 90 capsule, Rfl: 0   insulin aspart (NOVOLOG FLEXPEN) 100 UNIT/ML FlexPen, Inject 10 Units into the skin 3 (three) times daily with meals., Disp: 15 mL, Rfl: 1   insulin glargine (LANTUS) 100 UNIT/ML Solostar Pen, Inject 40 Units into the skin daily., Disp: 15 mL, Rfl: 0   metFORMIN (GLUCOPHAGE) 500 MG tablet, Take 1 tablet (500 mg total) by mouth 2 (two) times daily with a meal., Disp: 60 tablet, Rfl: 0   sacubitril-valsartan (ENTRESTO) 24-26 MG, Take 1 tablet by mouth 2 (two) times daily., Disp: 60 tablet, Rfl: 11   spironolactone (ALDACTONE) 25 MG tablet, Take 1 tablet (25 mg total) by mouth daily., Disp: 30 tablet, Rfl: 1    ALLERGIES   Ibuprofen     REVIEW OF SYSTEMS    Review of Systems:  Gen:  Denies  fever, sweats, chills weigh loss  HEENT: Denies blurred vision, double vision, ear pain, eye pain, hearing loss, nose bleeds, sore throat Cardiac:  No dizziness, chest pain or heaviness, chest tightness,edema Resp:   Denies cough or sputum porduction, shortness of breath,wheezing, hemoptysis,  Gi: Denies swallowing difficulty, stomach pain, nausea or vomiting, diarrhea, constipation, bowel incontinence Gu:  Denies bladder incontinence, burning urine Ext:   Denies Joint pain, stiffness or  swelling Skin: Denies  skin rash, easy bruising or bleeding or hives Endoc:  Denies polyuria, polydipsia , polyphagia or weight change Psych:   Denies depression, insomnia or hallucinations   Other:  All other systems negative   VS: BP (!) 133/97 (BP Location: Left Arm)   Pulse 97   Temp 98.2 F (36.8 C) (Oral)   Resp 16   Ht _0  (1.448 m)   Wt 64.9 kg   SpO2 97%   BMI 30.94 kg/m      PHYSICAL EXAM    GENERAL:NAD, no fevers, chills, no weakness no fatigue HEAD: Normocephalic, atraumatic.  EYES: Pupils equal, round, reactive to light. Extraocular muscles intact. No scleral icterus.  MOUTH: Moist mucosal membrane. Dentition intact. No abscess noted.  EAR, NOSE, THROAT: Clear without exudates. No external lesions.  NECK: Supple. No thyromegaly. No nodules. No JVD.  PULMONARY:  rhonchi bilaterally  CARDIOVASCULAR: S1 and S2. Regular rate and rhythm. No murmurs, rubs, or gallops. No edema. Pedal pulses 2+ bilaterally.  GASTROINTESTINAL: Soft, nontender, nondistended. No masses. Positive bowel sounds. No hepatosplenomegaly.  MUSCULOSKELETAL: No swelling, clubbing, or edema. Range of motion full in all extremities.  NEUROLOGIC: Cranial nerves II through XII are intact. No gross focal neurological deficits. Sensation intact. Reflexes intact.  SKIN: No ulceration, lesions, rashes, or cyanosis. Skin warm and dry. Turgor intact.  PSYCHIATRIC: Mood, affect within normal limits. The patient is awake, alert and oriented x 3. Insight, judgment intact.       IMAGING     DG Chest 2 View  Result Date: 06/13/2021 CLINICAL DATA:  Chest pain EXAM: CHEST - 2 VIEW COMPARISON:  06/03/2021, CT 07/02/2020 FINDINGS: Mild cardiomegaly. Near complete clearing of right upper lobe pneumonia. No pleural effusion or pneumothorax. Mild chronic reticular opacity in the lung bases IMPRESSION: 1. Near complete clearing of previously noted right upper lobe pneumonia 2. Cardiomegaly without overt pulmonary  edema or pleural effusion Electronically Signed   By: Donavan Foil M.D.   On: 06/13/2021 19:48   DG Chest 2 View  Result Date: 06/03/2021 CLINICAL DATA:  cough chills EXAM: CHEST - 2 VIEW COMPARISON:  Radiograph 07/09/2020 FINDINGS: Unchanged cardiomediastinal silhouette. There are patchy airspace opacities in the right upper lobe. No pleural effusion or visible pneumothorax. Thickening of the right major and minor fissures. There is no acute osseous abnormality. IMPRESSION: Right upper lobe pneumonia. Electronically Signed   By: Maurine Simmering M.D.   On: 06/03/2021 13:57   CT Angio Chest PE W and/or Wo Contrast  Result Date: 06/14/2021 CLINICAL DATA:  35 year old female with history of chest pain for the past few days. Evaluate for pulmonary embolism. EXAM: CT ANGIOGRAPHY CHEST WITH CONTRAST TECHNIQUE: Multidetector CT imaging of the chest was performed using the standard protocol during bolus administration of intravenous contrast. Multiplanar CT image reconstructions and MIPs were obtained to evaluate the vascular anatomy. CONTRAST:  72m OMNIPAQUE IOHEXOL 350 MG/ML SOLN COMPARISON:  Chest CT 07/02/2020. FINDINGS: Cardiovascular: No filling defects in the pulmonary arterial tree to suggest pulmonary embolism. Heart size is moderately enlarged. There is no significant pericardial fluid, thickening or pericardial calcification. No atherosclerotic calcifications are noted in the thoracic aorta or the coronary arteries. Mild dilatation of the pulmonic trunk (3.4 cm in diameter). Mediastinum/Nodes: No pathologically enlarged mediastinal or hilar lymph nodes. Several prominent borderline enlarged mediastinal and hilar lymph nodes are noted, similar to prior examinations, nonspecific but presumably chronic and reactive. Esophagus is unremarkable in appearance. No axillary lymphadenopathy. Lungs/Pleura: There is a background of mild diffuse ground-glass attenuation and interlobular septal thickening, suggesting  mild interstitial pulmonary edema. In addition, there are patchy areas of peribronchovascular ground-glass attenuation, most of which are nodular in appearance. Some of these are similar to prior studies, while others are new. The largest of these lesions is in the left lower lobe (axial image 63 of series 6) measuring 1.3 x 1.0 cm. Small thick-walled cavity also noted in the periphery of the left lower lobe, new compared to prior studies. No confluent consolidative airspace disease. No pleural effusions. Upper Abdomen: Unremarkable. Musculoskeletal: There are no aggressive appearing lytic or blastic lesions noted in the visualized portions of the skeleton. Review of the MIP images confirms the above findings. IMPRESSION: 1. No evidence of pulmonary embolism. 2. Cardiomegaly with evidence of interstitial pulmonary edema; imaging findings concerning for congestive heart failure. 3. Unusual pattern of scattered peribronchovascular predominant  ground-glass attenuation nodules. Some of these are similar to prior studies, while others are new. This patient has demonstrated intermittent waxing and waning ground-glass attenuation in the lungs, the appearance of which on some prior studies suggests cryptogenic organizing pneumonia (COP). Nonemergent pulmonology consultation is recommended in the near future to facilitate outpatient workup to determine the underlying etiology of this pattern. 4. Mild dilatation of the pulmonic trunk (3.4 cm in diameter), concerning for pulmonary arterial hypertension. Electronically Signed   By: Vinnie Langton M.D.   On: 06/14/2021 06:48      ASSESSMENT/PLAN   Acute hypoxemic respiratory failure - present on admission  - COVID19 negative   - supplemental O2 during my evaluation room air - will perform infectious workup for pneumonia -Respiratory viral panel -serum fungitell -legionella ab -strep pneumoniae ur AG -Histoplasma Ur Ag -sputum resp cultures -AFB sputum  expectorated specimen -sputum cytology  -reviewed pertinent imaging with patient today - ESR -PT/OT for d/c planning  -please encourage patient to use incentive spirometer few times each hour while hospitalized.        Thank you for allowing me to participate in the care of this patient.  Total face to face encounter time for this patient visit was >62mn. >50% of the time was  spent in counseling and coordination of care.   Patient/Family are satisfied with care plan and all questions have been answered.  This document was prepared using Dragon voice recognition software and may include unintentional dictation errors.     FOttie Glazier M.D.  Division of [facility]

## 2021-06-14 NOTE — ED Notes (Signed)
Patient transported to CT 

## 2021-06-14 NOTE — H&P (Addendum)
History and Physical    Cheryl Mercer RCV:[id number] DOB: [birth year] DOA: 06/14/2021  PCP: Pcp, No   Patient coming from: Home  I have personally briefly reviewed patient's old medical records in [facility]  Chief Complaint: Chest pain/leg swelling  HPI: Cheryl Mercer is a 35 y.o. female with medical history significant for chronic systolic heart failure with last known LVEF of 25 to 30% from an echo done in 5/21, history of insulin-dependent diabetes mellitus, hypertension and medication noncompliance who presents to the emergency room for evaluation of intermittent left-sided squeezing chest pain for several days associated with exertional shortness of breath and bilateral lower extremity swelling. Patient has been out of her medications for about 6 months and has not followed up with her primary care provider because she said he retired and she has been unable to establish care with someone else.  She also states that she has been unable to afford her medications as she is uninsured. She denies having any fever, no chills, no cough, no abdominal pain, no urinary symptoms, no dizziness, no lightheadedness, no headache, no palpitations, no diaphoresis, no PND, no orthopnea, no blurred vision no focal deficit. Labs show sodium 130, potassium 3.9, chloride 93, bicarb 29, glucose 550, BUN 5, creatinine 0.59, calcium 8.5, alkaline phosphatase 188, albumin 2.6, AST 28, ALT 14, total protein 7.5, BNP 953, troponin 38 >> 41, white count 10.4, hemoglobin 11.9, hematocrit 34.7, MCV 89.9, RDW 13.2, platelet count 558, D-dimer 1.80 Chest x-ray reviewed by me shows near complete clearing of previously noted right upper lobe pneumonia.  Cardiomegaly without overt pulmonary edema or pleural effusion. CT angiogram of the chest shows no evidence of pulmonary embolism. Cardiomegaly with evidence of interstitial pulmonary edema; imaging findings concerning for congestive heart  failure. Unusual pattern of scattered peribronchovascular predominant ground-glass attenuation nodules. Some of these are similar to prior studies, while others are new. This patient has demonstrated intermittent waxing and waning ground-glass attenuation in the lungs, the appearance of which on some prior studies suggests cryptogenic organizing pneumonia (COP). Nonemergent pulmonology consultation is recommended in the near future to facilitate outpatient workup to determine the underlying etiology of this pattern. Mild dilatation of the pulmonic trunk (3.4 cm in diameter), concerning for pulmonary arterial hypertension. Twelve-lead EKG reviewed by me shows sinus tachycardia with ST and T wave abnormality.     ED Course: Patient is a 35 year old female with a history of chronic combined systolic and diastolic heart failure, history of medication noncompliance and insulin-dependent diabetes mellitus who presents to the ER for evaluation of chest pain associated with exertional shortness of breath and bilateral lower extremity swelling. Imaging is suggestive of pulmonary edema and patient had significantly elevated blood pressure upon arrival as well as hyperglycemia. She will be admitted to the hospital for further evaluation.    Review of Systems: As per HPI otherwise all other systems reviewed and negative.    Past Medical History:  Diagnosis Date   CHF (congestive heart failure) (HCC)    Chronic back pain    Chronic combined systolic and diastolic congestive heart failure ([place]) 09/07/2019   Community acquired pneumonia 12/16/2019   Diabetes mellitus without complication ([place])    History of COVID-19 11/07/2019   Migraines    No pertinent past medical history    Pregnancy induced hypertension    Pulmonary embolus ([place]) 12/16/2019   Pulmonary infarct ([place]) 12/17/2019    Past Surgical History:  Procedure Laterality Date   CERVICAL BIOPSY  W/  LOOP ELECTRODE EXCISION  2009   Laparoscopic  tubal sterilization with Falope rings.  2009   RIGHT/LEFT HEART CATH AND CORONARY ANGIOGRAPHY N/A 03/25/2019   Procedure: RIGHT/LEFT HEART CATH AND CORONARY ANGIOGRAPHY;  Surgeon: Jolaine Artist, MD;  Location: [facility];  Service: Cardiovascular;  Laterality: N/A;   TUBAL LIGATION       reports that she has never smoked. She has never used smokeless tobacco. She reports that she does not drink alcohol and does not use drugs.  Allergies  Allergen Reactions   Ibuprofen Anaphylaxis    Family History  Problem Relation Age of Onset   Hypertension Mother    Anesthesia problems Neg Hx       Prior to Admission medications   Medication Sig Start Date End Date Taking? Authorizing Provider  atorvastatin (LIPITOR) 40 MG tablet Take 1 tablet (40 mg total) by mouth daily at 6 PM. 07/02/20   Isla Pence, MD  blood glucose meter kit and supplies Dispense based on patient and insurance preference. Check blood sugar 4 times daily, three times before meals and once before bedtime. (FOR ICD-10 E10.9, E11.9). 07/02/20   Isla Pence, MD  carvedilol (COREG) 6.25 MG tablet Take 1 tablet (6.25 mg total) by mouth 2 (two) times daily with a meal. 07/11/20   Tat, Shanon Brow, MD  furosemide (LASIX) 20 MG tablet Take 3 tablets (60 mg total) by mouth daily. 07/27/20   Bensimhon, Shaune Pascal, MD  gabapentin (NEURONTIN) 300 MG capsule Take 1 capsule (300 mg total) by mouth 3 (three) times daily. 07/02/20   Isla Pence, MD  insulin aspart (NOVOLOG FLEXPEN) 100 UNIT/ML FlexPen Inject 10 Units into the skin 3 (three) times daily with meals. 07/11/20   Orson Eva, MD  insulin glargine (LANTUS) 100 UNIT/ML Solostar Pen Inject 40 Units into the skin daily. 07/02/20   Isla Pence, MD  metFORMIN (GLUCOPHAGE) 500 MG tablet Take 1 tablet (500 mg total) by mouth 2 (two) times daily with a meal. 07/02/20   Isla Pence, MD  sacubitril-valsartan (ENTRESTO) 24-26 MG Take 1 tablet by mouth 2 (two) times daily.  07/24/20   Consuelo Pandy, PA-C  spironolactone (ALDACTONE) 25 MG tablet Take 1 tablet (25 mg total) by mouth daily. 07/12/20   Orson Eva, MD    Physical Exam: Vitals:   06/14/21 0326 06/14/21 0408 06/14/21 0409 06/14/21 0619  BP:  (!) 174/132 (!) 148/109 (!) 133/97  Pulse: (!) 116  (!) 119 97  Resp: _0 Temp:      TempSrc:      SpO2: 100%  97% 97%  Weight:      Height:         Vitals:   06/14/21 0326 06/14/21 0408 06/14/21 0409 06/14/21 0619  BP:  (!) 174/132 (!) 148/109 (!) 133/97  Pulse: (!) 116  (!) 119 97  Resp: _1 Temp:      TempSrc:      SpO2: 100%  97% 97%  Weight:      Height:          Constitutional: Alert and oriented x 3 . Not in any apparent distress HEENT:      Head: Normocephalic and atraumatic.         Eyes: PERLA, EOMI, Conjunctivae are normal. Sclera is non-icteric.       Mouth/Throat: Mucous membranes are moist.       Neck: Supple with no signs of meningismus. Cardiovascular: Tachycardic.  No murmurs, gallops, or rubs. 2+ symmetrical distal pulses are present . No JVD. 2+ LE edema Respiratory: Respiratory effort normal .crackles in both lung bases bilaterally. No wheezes or rhonchi.  Gastrointestinal: Soft, non tender, and non distended with positive bowel sounds.  Genitourinary: No CVA tenderness. Musculoskeletal: Nontender with normal range of motion in all extremities. No cyanosis, or erythema of extremities. Neurologic:  Face is symmetric. Moving all extremities. No gross focal neurologic deficits . Skin: Skin is warm, dry.  No rash or ulcers Psychiatric: Mood and affect are normal    Labs on Admission: I have personally reviewed following labs and imaging studies  CBC: Recent Labs  Lab 06/13/21 1932  WBC 10.4  HGB 11.9*  HCT 34.7*  MCV 89.9  PLT 937*   Basic Metabolic Panel: Recent Labs  Lab 06/13/21 1932  NA 130*  K 3.9  CL 93*  CO2 29  GLUCOSE 550*  BUN 5*  CREATININE 0.59  CALCIUM 8.5*    GFR: Estimated Creatinine Clearance: 76.8 mL/min (by C-G formula based on SCr of 0.59 mg/dL). Liver Function Tests: Recent Labs  Lab 06/13/21 1932  AST 28  ALT 14  ALKPHOS 198*  BILITOT 0.6  PROT 7.5  ALBUMIN 2.6*   No results for input(s): LIPASE, AMYLASE in the last 168 hours. No results for input(s): AMMONIA in the last 168 hours. Coagulation Profile: No results for input(s): INR, PROTIME in the last 168 hours. Cardiac Enzymes: No results for input(s): CKTOTAL, CKMB, CKMBINDEX, TROPONINI in the last 168 hours. BNP (last 3 results) No results for input(s): PROBNP in the last 8760 hours. HbA1C: No results for input(s): HGBA1C in the last 72 hours. CBG: Recent Labs  Lab 06/14/21 0401 06/14/21 0618 06/14/21 0909  GLUCAP 477* 314* 141*   Lipid Profile: No results for input(s): CHOL, HDL, LDLCALC, TRIG, CHOLHDL, LDLDIRECT in the last 72 hours. Thyroid Function Tests: No results for input(s): TSH, T4TOTAL, FREET4, T3FREE, THYROIDAB in the last 72 hours. Anemia Panel: No results for input(s): VITAMINB12, FOLATE, FERRITIN, TIBC, IRON, RETICCTPCT in the last 72 hours. Urine analysis:    Component Value Date/Time   COLORURINE YELLOW (A) 06/03/2021 1107   APPEARANCEUR HAZY (A) 06/03/2021 1107   LABSPEC 1.035 (H) 06/03/2021 1107   PHURINE 7.0 06/03/2021 1107   GLUCOSEU >=500 (A) 06/03/2021 1107   HGBUR NEGATIVE 06/03/2021 1107   BILIRUBINUR NEGATIVE 06/03/2021 1107   KETONESUR 20 (A) 06/03/2021 1107   PROTEINUR 100 (A) 06/03/2021 1107   UROBILINOGEN 0.2 02/04/2019 1720   NITRITE NEGATIVE 06/03/2021 1107   LEUKOCYTESUR SMALL (A) 06/03/2021 1107    Radiological Exams on Admission: DG Chest 2 View  Result Date: 06/13/2021 CLINICAL DATA:  Chest pain EXAM: CHEST - 2 VIEW COMPARISON:  06/03/2021, CT 07/02/2020 FINDINGS: Mild cardiomegaly. Near complete clearing of right upper lobe pneumonia. No pleural effusion or pneumothorax. Mild chronic reticular opacity in the lung  bases IMPRESSION: 1. Near complete clearing of previously noted right upper lobe pneumonia 2. Cardiomegaly without overt pulmonary edema or pleural effusion Electronically Signed   By: Donavan Foil M.D.   On: 06/13/2021 19:48   CT Angio Chest PE W and/or Wo Contrast  Result Date: 06/14/2021 CLINICAL DATA:  35 year old female with history of chest pain for the past few days. Evaluate for pulmonary embolism. EXAM: CT ANGIOGRAPHY CHEST WITH CONTRAST TECHNIQUE: Multidetector CT imaging of the chest was performed using the standard protocol during bolus administration of intravenous contrast. Multiplanar CT image reconstructions and MIPs were obtained  to evaluate the vascular anatomy. CONTRAST:  5m OMNIPAQUE IOHEXOL 350 MG/ML SOLN COMPARISON:  Chest CT 07/02/2020. FINDINGS: Cardiovascular: No filling defects in the pulmonary arterial tree to suggest pulmonary embolism. Heart size is moderately enlarged. There is no significant pericardial fluid, thickening or pericardial calcification. No atherosclerotic calcifications are noted in the thoracic aorta or the coronary arteries. Mild dilatation of the pulmonic trunk (3.4 cm in diameter). Mediastinum/Nodes: No pathologically enlarged mediastinal or hilar lymph nodes. Several prominent borderline enlarged mediastinal and hilar lymph nodes are noted, similar to prior examinations, nonspecific but presumably chronic and reactive. Esophagus is unremarkable in appearance. No axillary lymphadenopathy. Lungs/Pleura: There is a background of mild diffuse ground-glass attenuation and interlobular septal thickening, suggesting mild interstitial pulmonary edema. In addition, there are patchy areas of peribronchovascular ground-glass attenuation, most of which are nodular in appearance. Some of these are similar to prior studies, while others are new. The largest of these lesions is in the left lower lobe (axial image 63 of series 6) measuring 1.3 x 1.0 cm. Small thick-walled  cavity also noted in the periphery of the left lower lobe, new compared to prior studies. No confluent consolidative airspace disease. No pleural effusions. Upper Abdomen: Unremarkable. Musculoskeletal: There are no aggressive appearing lytic or blastic lesions noted in the visualized portions of the skeleton. Review of the MIP images confirms the above findings. IMPRESSION: 1. No evidence of pulmonary embolism. 2. Cardiomegaly with evidence of interstitial pulmonary edema; imaging findings concerning for congestive heart failure. 3. Unusual pattern of scattered peribronchovascular predominant ground-glass attenuation nodules. Some of these are similar to prior studies, while others are new. This patient has demonstrated intermittent waxing and waning ground-glass attenuation in the lungs, the appearance of which on some prior studies suggests cryptogenic organizing pneumonia (COP). Nonemergent pulmonology consultation is recommended in the near future to facilitate outpatient workup to determine the underlying etiology of this pattern. 4. Mild dilatation of the pulmonic trunk (3.4 cm in diameter), concerning for pulmonary arterial hypertension. Electronically Signed   By: DVinnie LangtonM.D.   On: 06/14/2021 06:48     Assessment/Plan Principal Problem:   Acute on chronic combined systolic (congestive) and diastolic (congestive) heart failure (HCC) Active Problems:   Uncontrolled type 2 diabetes mellitus with hyperglycemia (HCC)   Essential hypertension   Dilated cardiomyopathy (HCC)   Non compliance w medication regimen   Hyponatremia     Patient is a 35year old female who is being admitted to the hospital for acute on chronic combined systolic and diastolic dysfunction CHF     Acute on chronic combined systolic and diastolic dysfunction CHF/dilated cardiomyopathy Patient's last known LVEF was 25 to 30% from a 2D echocardiogram which was done in May, 2021 She presents for evaluation of  exertional shortness of breath and bilateral lower extremity swelling and has been noncompliant with prescribed medications Will place patient on Lasix 40 mg IV daily Continue carvedilol, Entresto and spironolactone Maintain low-sodium diet Repeat 2D echocardiogram to assess LVEF Consult cardiology    Hypertension Uncontrolled and secondary to medication noncompliance Will resume carvedilol and Entresto Maintain low-sodium diet    Diabetes mellitus with hyperglycemia Secondary to medication noncompliance Resume Lantus 40 units daily and place patient on sliding scale coverage with NovoLog Diabetic education Maintain consistent carbohydrate diet     Medication noncompliance Patient has been noncompliant with her medications due to financial reasons and being uninsured We will request TOC consult    Hyponatremia Most likely secondary to hyperglycemia Expect improvement in serum  sodium levels with resolution of hypoglycemia  DVT prophylaxis: Lovenox  Code Status: full code  Family Communication: Greater than 50% of time was spent discussing patient's condition and plan of care with her at the bedside.  All questions and concerns have been addressed.  She verbalizes understanding and agrees with the plan.  CODE STATUS was discussed and she is a full code. Disposition Plan: Back to previous home environment Consults called: Cardiology, TOC Status:At the time of admission, it appears that the appropriate admission status for this patient is inpatient. This is judged to be reasonable and necessary to provide the required intensity of service to ensure the patient's safety given the presenting symptoms, physical exam findings, and initial radiographic and laboratory data in the context of their comorbid conditions. Patient requires inpatient status due to high intensity of service, high risk for further deterioration and high frequency of surveillance required.     Brylyn Novakovich MD [facility]     06/14/2021, 9:30 AM

## 2021-06-14 NOTE — Plan of Care (Signed)
Dr. 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

## 2021-06-14 NOTE — ED Provider Notes (Addendum)
[facility] Emergency Department Provider Note  ____________________________________________   Event Date/Time   First MD Initiated Contact with Patient 06/14/21 [phone]     (approximate)  I have reviewed the triage vital signs and the nursing notes.   HISTORY  Chief Complaint Edema    HPI Cheryl Mercer is a 35 y.o. female with history of hypertension, insulin-dependent diabetes, CHF, PE not currently on anticoagulation, medical noncompliance who presents to the emergency department with complaints of intermittent squeezing chest pain for the past several days, worsening shortness of breath and lower extremity swelling.  States that she has been out of all of her medications for the past 6 months.  She states that she is not able to afford her medications and does not have follow-up.  She denies any fevers or cough.  She states she is concerned this is her CHF.  She was recently treated for pneumonia and has finished antibiotics.      Echo 12/20/2019:  IMPRESSIONS     1. Left ventricular ejection fraction, by estimation, is 25 to 30%. The  left ventricle has severely decreased function. The left ventricle  demonstrates global hypokinesis. The left ventricular internal cavity size  was mildly dilated. Left ventricular  diastolic parameters are consistent with Grade II diastolic dysfunction  (pseudonormalization). Elevated left atrial pressure.   2. Right ventricular systolic function is mildly reduced. The right  ventricular size is normal. There is normal pulmonary artery systolic  pressure.   3. The mitral valve is abnormal. Mild mitral valve regurgitation. No  evidence of mitral stenosis.   4. The aortic valve is tricuspid. Aortic valve regurgitation is not  visualized. No aortic stenosis is present.   5. The inferior vena cava is normal in size with <50% respiratory  variability, suggesting right atrial pressure of 8 mmHg.   Past Medical  History:  Diagnosis Date   CHF (congestive heart failure) (HCC)    Chronic back pain    Chronic combined systolic and diastolic congestive heart failure ([place]) 09/07/2019   Community acquired pneumonia 12/16/2019   Diabetes mellitus without complication ([place])    History of COVID-19 11/07/2019   Migraines    No pertinent past medical history    Pregnancy induced hypertension    Pulmonary embolus ([place]) 12/16/2019   Pulmonary infarct ([place]) 12/17/2019    Patient Active Problem List   Diagnosis Date Noted   CHF (congestive heart failure) ([place]) 06/14/2021   Acute exacerbation of CHF (congestive heart failure) ([place]) 07/10/2020   Apnea    Pulmonary infarct ([place]) 12/17/2019   Pulmonary embolus ([place]) 12/16/2019   Community acquired pneumonia 12/16/2019   Dilated cardiomyopathy ([place]) 11/10/2019   History of COVID-19 11/07/2019   Acute on chronic combined systolic (congestive) and diastolic (congestive) heart failure ([place]) 11/07/2019   Elevated troponin 11/07/2019   Viral pneumonia    Tachycardia    Multifocal pneumonia 09/07/2019   Chronic combined systolic and diastolic congestive heart failure ([place]) 09/07/2019   Dyslipidemia 09/07/2019   Back pain 09/07/2019   Atypical pneumonia 09/07/2019   Essential hypertension 03/24/2019   Uncontrolled type 2 diabetes mellitus with hyperglycemia ([place]) 03/23/2019   Peripheral edema 03/23/2019   Cardiomegaly 03/23/2019   Acute on chronic combined systolic and diastolic CHF (congestive heart failure) ([place]) 03/23/2019   Proteinuria 03/23/2019   Acute bacterial tonsillitis 05/18/2013    Class: Acute   Lower urinary tract infectious disease 07/30/2012    Past Surgical History:  Procedure Laterality Date  CERVICAL BIOPSY  W/ LOOP ELECTRODE EXCISION  2009   Laparoscopic tubal sterilization with Falope rings.  2009   RIGHT/LEFT HEART CATH AND CORONARY ANGIOGRAPHY N/A 03/25/2019   Procedure: RIGHT/LEFT HEART CATH AND CORONARY ANGIOGRAPHY;  Surgeon:  Jolaine Artist, MD;  Location: [facility];  Service: Cardiovascular;  Laterality: N/A;   TUBAL LIGATION      Prior to Admission medications   Medication Sig Start Date End Date Taking? Authorizing Provider  atorvastatin (LIPITOR) 40 MG tablet Take 1 tablet (40 mg total) by mouth daily at 6 PM. 07/02/20   Isla Pence, MD  blood glucose meter kit and supplies Dispense based on patient and insurance preference. Check blood sugar 4 times daily, three times before meals and once before bedtime. (FOR ICD-10 E10.9, E11.9). 07/02/20   Isla Pence, MD  carvedilol (COREG) 6.25 MG tablet Take 1 tablet (6.25 mg total) by mouth 2 (two) times daily with a meal. 07/11/20   Tat, Shanon Brow, MD  furosemide (LASIX) 20 MG tablet Take 3 tablets (60 mg total) by mouth daily. 07/27/20   Bensimhon, Shaune Pascal, MD  gabapentin (NEURONTIN) 300 MG capsule Take 1 capsule (300 mg total) by mouth 3 (three) times daily. 07/02/20   Isla Pence, MD  insulin aspart (NOVOLOG FLEXPEN) 100 UNIT/ML FlexPen Inject 10 Units into the skin 3 (three) times daily with meals. 07/11/20   Orson Eva, MD  insulin glargine (LANTUS) 100 UNIT/ML Solostar Pen Inject 40 Units into the skin daily. 07/02/20   Isla Pence, MD  metFORMIN (GLUCOPHAGE) 500 MG tablet Take 1 tablet (500 mg total) by mouth 2 (two) times daily with a meal. 07/02/20   Isla Pence, MD  sacubitril-valsartan (ENTRESTO) 24-26 MG Take 1 tablet by mouth 2 (two) times daily. 07/24/20   Consuelo Pandy, PA-C  spironolactone (ALDACTONE) 25 MG tablet Take 1 tablet (25 mg total) by mouth daily. 07/12/20   Orson Eva, MD    Allergies Ibuprofen  Family History  Problem Relation Age of Onset   Hypertension Mother    Anesthesia problems Neg Hx     Social History Social History   Tobacco Use   Smoking status: Never   Smokeless tobacco: Never  Vaping Use   Vaping Use: Never used  Substance Use Topics   Alcohol use: No    Alcohol/week: 6.0  standard drinks    Types: 6 Shots of liquor per week   Drug use: No    Types: Marijuana    Review of Systems Constitutional: No fever. Eyes: No visual changes. ENT: No sore throat. Cardiovascular: + chest pain. Respiratory: + shortness of breath. Gastrointestinal: No nausea, vomiting, diarrhea. Genitourinary: Negative for dysuria. Musculoskeletal: Negative for back pain. Skin: Negative for rash. Neurological: Negative for focal weakness or numbness.  ____________________________________________   PHYSICAL EXAM:  VITAL SIGNS: ED Triage Vitals [06/13/21 1922]  Enc Vitals Group     BP (!) 146/112     Pulse Rate (!) 123     Resp 18     Temp 98.2 F (36.8 C)     Temp Source Oral     SpO2 96 %     Weight 143 lb (64.9 kg)     Height 4' 9"  (1.448 m)     Head Circumference      Peak Flow      Pain Score 7     Pain Loc      Pain Edu?      Excl. in [place]?  CONSTITUTIONAL: Alert and oriented and responds appropriately to questions.  Chronically ill-appearing, in no distress HEAD: Normocephalic EYES: Conjunctivae clear, pupils appear equal, EOM appear intact ENT: normal nose; moist mucous membranes NECK: Supple, normal ROM CARD: RRR; S1 and S2 appreciated; no murmurs, no clicks, no rubs, no gallops RESP: Normal chest excursion without splinting or tachypnea; breath sounds clear and equal bilaterally; no wheezes, no rhonchi, no rales, no hypoxia or respiratory distress, speaking full sentences ABD/GI: Normal bowel sounds; non-distended; soft, non-tender, no rebound, no guarding, no peritoneal signs, no hepatosplenomegaly BACK: The back appears normal EXT: Normal ROM in all joints; no deformity noted, nonpitting edema in bilateral lower extremities, no calf tenderness or calf swelling SKIN: Normal color for age and race; warm; no rash on exposed skin NEURO: Moves all extremities equally PSYCH: The patient's mood and manner are  appropriate.  ____________________________________________   LABS (all labs ordered are listed, but only abnormal results are displayed)  Labs Reviewed  CBC - Abnormal; Notable for the following components:      Result Value   RBC 3.86 (*)    Hemoglobin 11.9 (*)    HCT 34.7 (*)    Platelets 558 (*)    All other components within normal limits  COMPREHENSIVE METABOLIC PANEL - Abnormal; Notable for the following components:   Sodium 130 (*)    Chloride 93 (*)    Glucose, Bld 550 (*)    BUN 5 (*)    Calcium 8.5 (*)    Albumin 2.6 (*)    Alkaline Phosphatase 198 (*)    All other components within normal limits  BRAIN NATRIURETIC PEPTIDE - Abnormal; Notable for the following components:   B Natriuretic Peptide 953.8 (*)    All other components within normal limits  D-DIMER, QUANTITATIVE - Abnormal; Notable for the following components:   D-Dimer, Quant 1.80 (*)    All other components within normal limits  CBG MONITORING, ED - Abnormal; Notable for the following components:   Glucose-Capillary 477 (*)    All other components within normal limits  CBG MONITORING, ED - Abnormal; Notable for the following components:   Glucose-Capillary 314 (*)    All other components within normal limits  TROPONIN I (HIGH SENSITIVITY) - Abnormal; Notable for the following components:   Troponin I (High Sensitivity) 38 (*)    All other components within normal limits  TROPONIN I (HIGH SENSITIVITY) - Abnormal; Notable for the following components:   Troponin I (High Sensitivity) 41 (*)    All other components within normal limits  POC URINE PREG, ED   ____________________________________________  EKG   EKG Interpretation  Date/Time:  Thursday June 13 2021 19:28:52 EDT Ventricular Rate:  115 PR Interval:  162 QRS Duration: 90 QT Interval:  338 QTC Calculation: 467 R Axis:   -2 Text Interpretation: Sinus tachycardia ST & T wave abnormality, consider lateral ischemia Abnormal ECG No  significant change since last tracing Confirmed by Pryor Curia [phone]) on 06/14/2021 7:07:51 AM        ____________________________________________  RADIOLOGY Jessie Foot Acquanetta Cabanilla, personally viewed and evaluated these images (plain radiographs) as part of my medical decision making, as well as reviewing the written report by the radiologist.  ED MD interpretation: Imaging concerning for pulmonary edema.  No PE.  Official radiology report(s): DG Chest 2 View  Result Date: 06/13/2021 CLINICAL DATA:  Chest pain EXAM: CHEST - 2 VIEW COMPARISON:  06/03/2021, CT 07/02/2020 FINDINGS: Mild cardiomegaly. Near complete clearing of right upper lobe  pneumonia. No pleural effusion or pneumothorax. Mild chronic reticular opacity in the lung bases IMPRESSION: 1. Near complete clearing of previously noted right upper lobe pneumonia 2. Cardiomegaly without overt pulmonary edema or pleural effusion Electronically Signed   By: Donavan Foil M.D.   On: 06/13/2021 19:48   CT Angio Chest PE W and/or Wo Contrast  Result Date: 06/14/2021 CLINICAL DATA:  35 year old female with history of chest pain for the past few days. Evaluate for pulmonary embolism. EXAM: CT ANGIOGRAPHY CHEST WITH CONTRAST TECHNIQUE: Multidetector CT imaging of the chest was performed using the standard protocol during bolus administration of intravenous contrast. Multiplanar CT image reconstructions and MIPs were obtained to evaluate the vascular anatomy. CONTRAST:  56m OMNIPAQUE IOHEXOL 350 MG/ML SOLN COMPARISON:  Chest CT 07/02/2020. FINDINGS: Cardiovascular: No filling defects in the pulmonary arterial tree to suggest pulmonary embolism. Heart size is moderately enlarged. There is no significant pericardial fluid, thickening or pericardial calcification. No atherosclerotic calcifications are noted in the thoracic aorta or the coronary arteries. Mild dilatation of the pulmonic trunk (3.4 cm in diameter). Mediastinum/Nodes: No pathologically  enlarged mediastinal or hilar lymph nodes. Several prominent borderline enlarged mediastinal and hilar lymph nodes are noted, similar to prior examinations, nonspecific but presumably chronic and reactive. Esophagus is unremarkable in appearance. No axillary lymphadenopathy. Lungs/Pleura: There is a background of mild diffuse ground-glass attenuation and interlobular septal thickening, suggesting mild interstitial pulmonary edema. In addition, there are patchy areas of peribronchovascular ground-glass attenuation, most of which are nodular in appearance. Some of these are similar to prior studies, while others are new. The largest of these lesions is in the left lower lobe (axial image 63 of series 6) measuring 1.3 x 1.0 cm. Small thick-walled cavity also noted in the periphery of the left lower lobe, new compared to prior studies. No confluent consolidative airspace disease. No pleural effusions. Upper Abdomen: Unremarkable. Musculoskeletal: There are no aggressive appearing lytic or blastic lesions noted in the visualized portions of the skeleton. Review of the MIP images confirms the above findings. IMPRESSION: 1. No evidence of pulmonary embolism. 2. Cardiomegaly with evidence of interstitial pulmonary edema; imaging findings concerning for congestive heart failure. 3. Unusual pattern of scattered peribronchovascular predominant ground-glass attenuation nodules. Some of these are similar to prior studies, while others are new. This patient has demonstrated intermittent waxing and waning ground-glass attenuation in the lungs, the appearance of which on some prior studies suggests cryptogenic organizing pneumonia (COP). Nonemergent pulmonology consultation is recommended in the near future to facilitate outpatient workup to determine the underlying etiology of this pattern. 4. Mild dilatation of the pulmonic trunk (3.4 cm in diameter), concerning for pulmonary arterial hypertension. Electronically Signed   By:  DVinnie LangtonM.D.   On: 06/14/2021 06:48    ____________________________________________   PROCEDURES  Procedure(s) performed (including Critical Care):  Procedures  CRITICAL CARE Performed by: KCyril MourningWard   Total critical care time: 45 minutes  Critical care time was exclusive of separately billable procedures and treating other patients.  Critical care was necessary to treat or prevent imminent or life-threatening deterioration.  Critical care was time spent personally by me on the following activities: development of treatment plan with patient and/or surrogate as well as nursing, discussions with consultants, evaluation of patient's response to treatment, examination of patient, obtaining history from patient or surrogate, ordering and performing treatments and interventions, ordering and review of laboratory studies, ordering and review of radiographic studies, pulse oximetry and re-evaluation of patient's condition.  ____________________________________________  INITIAL IMPRESSION / ASSESSMENT AND PLAN / ED COURSE  As part of my medical decision making, I reviewed the following data within the [facility] notes reviewed and incorporated, Labs reviewed , EKG interpreted , Old EKG reviewed, Old chart reviewed, Radiograph reviewed , Discussed with admitting physician , CT reviewed, and Notes from prior ED visits         Patient here with complaints of chest pain, shortness of breath, peripheral edema.  Has history of CHF and medical noncompliance.  Patient is hypertensive, tachycardic here.  Labs obtained from triage show that she is hyperglycemic without DKA.  Her first troponin is slightly elevated more than her baseline.  Differential includes ACS, PE, dissection, CHF exacerbation, pneumonia.  Chest x-ray reviewed by myself and radiology shows no obvious edema.  No pneumothorax, infiltrate.  Recently treated for community-acquired pneumonia and  this appears to have resolved.  Will give home medications here in the ED to see if we can help her vital signs and blood sugar.  Will check second troponin, D-dimer.  ED PROGRESS  Patient's repeat troponin is still slightly elevated but stable.  D-dimer significantly elevated.  We will proceed with CTA of the chest.  Patient's heart rate and blood pressure slowly improving.    Patient CTA shows no PE but does show interstitial edema.  She also has findings consistent with BOOP.  She does not have a pulmonologist for follow-up.  She may benefit from steroids however I will defer this decision to the primary team, pulmonology especially given she has poorly controlled type 2 insulin-dependent diabetes.  She is diuresing well after IV Lasix in the emergency department.  Her heart rate and blood pressure have improved and her blood sugar is now in the 300s.  We did discuss admission versus discharge with close outpatient follow-up but given my concerns for her social situation and history of medical noncompliance I feel she would benefit from admission to the hospital for acute stabilization, diuresis.  She is comfortable with this plan.  Will discuss with hospitalist.    7:05 AM  Discussed patient's case with hospitalist, Dr. Francine Graven.  I have recommended admission and patient (and family if present) agree with this plan. Admitting physician will place admission orders.   I reviewed all nursing notes, vitals, pertinent previous records and reviewed/interpreted all EKGs, lab and urine results, imaging (as available).  ____________________________________________   FINAL CLINICAL IMPRESSION(S) / ED DIAGNOSES  Final diagnoses:  Chest pain, unspecified type  Peripheral edema  Hyperglycemia  Hypertension, unspecified type  Noncompliance with medication regimen  Acute on chronic congestive heart failure, unspecified heart failure type ([place])  BOOP (bronchiolitis obliterans with organizing pneumonia)  [facility])     ED Discharge Orders     None       *Please note:  Cheryl Mercer was evaluated in Emergency Department on 06/14/2021 for the symptoms described in the history of present illness. She was evaluated in the context of the global COVID-19 pandemic, which necessitated consideration that the patient might be at risk for infection with the SARS-CoV-2 virus that causes COVID-19. Institutional protocols and algorithms that pertain to the evaluation of patients at risk for COVID-19 are in a state of rapid change based on information released by regulatory bodies including the CDC and federal and state organizations. These policies and algorithms were followed during the patient's care in the ED.  Some ED evaluations and interventions may be delayed as a result of limited staffing  during and the pandemic.*   Note:  This document was prepared using Dragon voice recognition software and may include unintentional dictation errors.    Ahana Najera, Delice Bison, DO 06/14/21 [place], Delice Bison, DO 06/14/21 [phone]

## 2021-06-14 NOTE — Progress Notes (Addendum)
Inpatient Diabetes Program Recommendations  AACE/ADA: New Consensus Statement on Inpatient Glycemic Control   Target Ranges:  Prepandial:   less than 140 mg/dL      Peak postprandial:   less than 180 mg/dL (1-2 hours)      Critically ill patients:  140 - 180 mg/dL   Results for Cheryl Mercer, Cheryl Mercer (MRN [id number]) as of 06/14/2021 09:01  Ref. Range 06/14/2021 04:01 06/14/2021 06:18  Glucose-Capillary Latest Ref Range: 70 - 99 mg/dL 477 (H)  Novolog 10 units _0 :10  Semglee 40 units @ 4:08 314 (H)  Results for Cheryl Mercer, Cheryl Mercer (MRN [id number]) as of 06/14/2021 09:01  Ref. Range 06/13/2021 19:32  Glucose Latest Ref Range: 70 - 99 mg/dL [facility])   Results for Cheryl Mercer, Cheryl Mercer (MRN [id number]) as of 06/14/2021 09:01  Ref. Range 03/23/2019 09:16 09/07/2019 02:23 11/07/2019 07:28 07/09/2020 23:01  Hemoglobin A1C Latest Ref Range: 4.8 - 5.6 % 13.9 (H) 12.4 (H) 13.8 (H) 13.4 (H)   Review of Glycemic Control  Diabetes history: DM Outpatient Diabetes medications: Lantus 40 units daily, Novolog 6 units TID with meals, Metformin 500 mg BID Current orders for Inpatient glycemic control: None  Inpatient Diabetes Program Recommendations:    Insulin: Patient was given Semglee 40 units at 4:08 am today. Please consider ordering Semglee 40 units daily (to start 06/15/21), Novolog 0-15 units TID with meals, Novolog 0-5 units QHS, and Novolog 4 units TID with meals for meal coverage if patient eats at least 50% of meals.  Outpatient DM needs: At time of discharge patient will need Rx for: glucose monitoring kit (#[id number]), insulin pens, insulin pen needles, and Metformin (if continued).  NOTE: Patient in ED with chest pain and being admitted. No admission orders yet from hospitalist. Per note by Dr. Leonides Schanz on 06/14/21, " States that she has been out of all of her medications for the past 6 months.  She states that she is not able to afford her medications and does not have follow-up." Patient  has Medicaid insurance so medications should be $4 each. Patient was last inpatient at [facility] 07/09/20-07/11/20 and inpatient diabetes coordinator talked with patient on 07/10/20.  Spoke with patient over the phone. Patient states that she previously had Medicaid and it had ended 6-7 months ago. Patient states she just recently got everything she needed to apply for recertification but she states she has not received anything in the mail to let her know if she was approved or denied. Patient confirms that she was taking Lantus 40 units daily, Novolog 6 units TID, and Metformin 500 mg BID for DM control until she ran out of medications 6 months ago. Patient states that she use to have a PCP at [facility] but he retired and she has not gotten a new PCP. Patient states she has checked with several providers and no one she called was taking new patients. Patient reports that she is out of all testing supplies for glucose monitoring. Informed patient that [facility] consult would be ordered to see if they can verify she has active Medicaid or not. If Medicaid is active, would ask TOC to provide list of local providers taking new patients with [employer]. If Medicaid is not active, patient will need assistance with arranging follow up with local clinic and will also need medication assistance at discharge. Stressed importance of DM control, consistently taking DM medications, and consistently following up with primary care providers so they can assist with DM management and they can  continue to provided needed prescriptions. Patient verbalized understanding of information and she states she has no questions at this time.   Thanks, Barnie Alderman, RN, MSN, CDE Diabetes Coordinator Inpatient Diabetes Program [phone] (Team Pager from 8am to 5pm)

## 2021-06-14 NOTE — Consult Note (Addendum)
[facility] Consultation Note  Patient ID: Cheryl Mercer, MRN: [id number], DOB/AGE: July 21, 1986 35 y.o. Admit date: 06/14/2021   Date of Consult: 06/14/2021 Primary Physician: Pcp, No Primary Cardiologist: none  Chief Complaint:  Chief Complaint  Patient presents with   Edema   Reason for Consult:  Acute on chronic combined systolic and diastolic dysfunction CHF/dilated cardiomyopathy  HPI: 35 y.o. female with a past medical history of chronic systolic heart failure with last known EF of 25-30% in 12/2019, type 2 diabetes, hypertension.  Patient has a history of medication noncompliance with presentation to the ER today for evaluation of intermittent left-sided chest pain described as a tightness for the past several days with worsening shortness of breath on exertion and lower extremity edema.  Patient states that she has been out of her medications for approximately 6 months and has not followed up with her primary provider as she states that he has been tired and she has not currently established with another provider.  Chest x-ray with no evidence of pleural effusion or overt edema.  Patient has stated significant improvement in her lower extremity edema since receiving IV Lasix and being restarted on her carvedilol, Entresto, spironolactone.  She currently denies any shortness of breath on exertion, lower extremity edema, chest pain, shortness of breath, weakness, fatigue.   Past Medical History:  Diagnosis Date   CHF (congestive heart failure) (HCC)    Chronic back pain    Chronic combined systolic and diastolic congestive heart failure ([place]) 09/07/2019   Community acquired pneumonia 12/16/2019   Diabetes mellitus without complication ([place])    History of COVID-19 11/07/2019   Migraines    No pertinent past medical history    Pregnancy induced hypertension    Pulmonary embolus ([place]) 12/16/2019   Pulmonary infarct ([place]) 12/17/2019      Surgical History:  Past  Surgical History:  Procedure Laterality Date   CERVICAL BIOPSY  W/ LOOP ELECTRODE EXCISION  2009   Laparoscopic tubal sterilization with Falope rings.  2009   RIGHT/LEFT HEART CATH AND CORONARY ANGIOGRAPHY N/A 03/25/2019   Procedure: RIGHT/LEFT HEART CATH AND CORONARY ANGIOGRAPHY;  Surgeon: Jolaine Artist, MD;  Location: [facility];  Service: Cardiovascular;  Laterality: N/A;   TUBAL LIGATION       Home Meds: Prior to Admission medications   Medication Sig Start Date End Date Taking? Authorizing Provider  insulin aspart (NOVOLOG FLEXPEN) 100 UNIT/ML FlexPen Inject 10 Units into the skin 3 (three) times daily with meals. 07/11/20  Yes Tat, Shanon Brow, MD  atorvastatin (LIPITOR) 40 MG tablet Take 1 tablet (40 mg total) by mouth daily at 6 PM. Patient not taking: No sig reported 07/02/20   Isla Pence, MD  blood glucose meter kit and supplies Dispense based on patient and insurance preference. Check blood sugar 4 times daily, three times before meals and once before bedtime. (FOR ICD-10 E10.9, E11.9). 07/02/20   Isla Pence, MD  carvedilol (COREG) 6.25 MG tablet Take 1 tablet (6.25 mg total) by mouth 2 (two) times daily with a meal. Patient not taking: No sig reported 07/11/20   Tat, Shanon Brow, MD  furosemide (LASIX) 20 MG tablet Take 3 tablets (60 mg total) by mouth daily. Patient not taking: No sig reported 07/27/20   Bensimhon, Shaune Pascal, MD  gabapentin (NEURONTIN) 300 MG capsule Take 1 capsule (300 mg total) by mouth 3 (three) times daily. Patient not taking: No sig reported 07/02/20   Isla Pence, MD  insulin glargine (  LANTUS) 100 UNIT/ML Solostar Pen Inject 40 Units into the skin daily. Patient not taking: No sig reported 07/02/20   Isla Pence, MD  metFORMIN (GLUCOPHAGE) 500 MG tablet Take 1 tablet (500 mg total) by mouth 2 (two) times daily with a meal. Patient not taking: No sig reported 07/02/20   Isla Pence, MD  sacubitril-valsartan (ENTRESTO) 24-26 MG Take 1  tablet by mouth 2 (two) times daily. Patient not taking: No sig reported 07/24/20   Lyda Jester M, PA-C  spironolactone (ALDACTONE) 25 MG tablet Take 1 tablet (25 mg total) by mouth daily. Patient not taking: No sig reported 07/12/20   Orson Eva, MD    Inpatient Medications:   atorvastatin  40 mg Oral q1800   carvedilol  6.25 mg Oral BID WC   enoxaparin (LOVENOX) injection  40 mg Subcutaneous Q24H   [START ON 06/15/2021] furosemide  40 mg Intravenous Daily   insulin aspart  0-15 Units Subcutaneous TID WC   insulin glargine-yfgn  40 Units Subcutaneous Daily   sacubitril-valsartan  1 tablet Oral BID   spironolactone  25 mg Oral Daily     Allergies:  Allergies  Allergen Reactions   Ibuprofen Anaphylaxis    Social History   Socioeconomic History   Marital status: Married    Spouse name: Not on file   Number of children: 4   Years of education: Not on file   Highest education level: Not on file  Occupational History   Not on file  Tobacco Use   Smoking status: Never   Smokeless tobacco: Never  Vaping Use   Vaping Use: Never used  Substance and Sexual Activity   Alcohol use: No    Alcohol/week: 6.0 standard drinks    Types: 6 Shots of liquor per week   Drug use: No    Types: Marijuana   Sexual activity: Not Currently    Birth control/protection: Surgical    Comment: band placed around tubes in 2009 surgically by Dr. Glo Herring  Other Topics Concern   Not on file  Social History Narrative   Not on file   Social Determinants of Health   Financial Resource Strain: Not on file  Food Insecurity: Not on file  Transportation Needs: Not on file  Physical Activity: Not on file  Stress: Not on file  Social Connections: Not on file  Intimate Partner Violence: Not on file     Family History  Problem Relation Age of Onset   Hypertension Mother    Anesthesia problems Neg Hx      Review of Systems Positive for shortness of breath Negative for: General:   chills, fever, night sweats or weight changes.  Cardiovascular: PND orthopnea syncope dizziness  Dermatological skin lesions rashes Respiratory: Cough congestion Urologic: Frequent urination urination at night and hematuria Abdominal: negative for nausea, vomiting, diarrhea, bright red blood per rectum, melena, or hematemesis Neurologic: negative for visual changes, and/or hearing changes  All other systems reviewed and are otherwise negative except as noted above.  Labs: No results for input(s): CKTOTAL, CKMB, TROPONINI in the last 72 hours. Lab Results  Component Value Date   WBC 13.5 (H) 06/14/2021   HGB 12.4 06/14/2021   HCT 34.8 (L) 06/14/2021   MCV 87.9 06/14/2021   PLT 591 (H) 06/14/2021    Recent Labs  Lab 06/13/21 1932 06/14/21 1100  NA 130*  --   K 3.9  --   CL 93*  --   CO2 29  --   BUN 5*  --  CREATININE 0.59 0.46  CALCIUM 8.5*  --   PROT 7.5  --   BILITOT 0.6  --   ALKPHOS 198*  --   ALT 14  --   AST 28  --   GLUCOSE 550*  --    Lab Results  Component Value Date   CHOL 135 07/10/2020   HDL 40 (L) 07/10/2020   LDLCALC 75 07/10/2020   TRIG 100 07/10/2020   Lab Results  Component Value Date   DDIMER 1.80 (H) 06/14/2021    Radiology/Studies:  DG Chest 2 View  Result Date: 06/13/2021 CLINICAL DATA:  Chest pain EXAM: CHEST - 2 VIEW COMPARISON:  06/03/2021, CT 07/02/2020 FINDINGS: Mild cardiomegaly. Near complete clearing of right upper lobe pneumonia. No pleural effusion or pneumothorax. Mild chronic reticular opacity in the lung bases IMPRESSION: 1. Near complete clearing of previously noted right upper lobe pneumonia 2. Cardiomegaly without overt pulmonary edema or pleural effusion Electronically Signed   By: Donavan Foil M.D.   On: 06/13/2021 19:48   DG Chest 2 View  Result Date: 06/03/2021 CLINICAL DATA:  cough chills EXAM: CHEST - 2 VIEW COMPARISON:  Radiograph 07/09/2020 FINDINGS: Unchanged cardiomediastinal silhouette. There are patchy  airspace opacities in the right upper lobe. No pleural effusion or visible pneumothorax. Thickening of the right major and minor fissures. There is no acute osseous abnormality. IMPRESSION: Right upper lobe pneumonia. Electronically Signed   By: Maurine Simmering M.D.   On: 06/03/2021 13:57   CT Angio Chest PE W and/or Wo Contrast  Result Date: 06/14/2021 CLINICAL DATA:  35 year old female with history of chest pain for the past few days. Evaluate for pulmonary embolism. EXAM: CT ANGIOGRAPHY CHEST WITH CONTRAST TECHNIQUE: Multidetector CT imaging of the chest was performed using the standard protocol during bolus administration of intravenous contrast. Multiplanar CT image reconstructions and MIPs were obtained to evaluate the vascular anatomy. CONTRAST:  71m OMNIPAQUE IOHEXOL 350 MG/ML SOLN COMPARISON:  Chest CT 07/02/2020. FINDINGS: Cardiovascular: No filling defects in the pulmonary arterial tree to suggest pulmonary embolism. Heart size is moderately enlarged. There is no significant pericardial fluid, thickening or pericardial calcification. No atherosclerotic calcifications are noted in the thoracic aorta or the coronary arteries. Mild dilatation of the pulmonic trunk (3.4 cm in diameter). Mediastinum/Nodes: No pathologically enlarged mediastinal or hilar lymph nodes. Several prominent borderline enlarged mediastinal and hilar lymph nodes are noted, similar to prior examinations, nonspecific but presumably chronic and reactive. Esophagus is unremarkable in appearance. No axillary lymphadenopathy. Lungs/Pleura: There is a background of mild diffuse ground-glass attenuation and interlobular septal thickening, suggesting mild interstitial pulmonary edema. In addition, there are patchy areas of peribronchovascular ground-glass attenuation, most of which are nodular in appearance. Some of these are similar to prior studies, while others are new. The largest of these lesions is in the left lower lobe (axial image 63  of series 6) measuring 1.3 x 1.0 cm. Small thick-walled cavity also noted in the periphery of the left lower lobe, new compared to prior studies. No confluent consolidative airspace disease. No pleural effusions. Upper Abdomen: Unremarkable. Musculoskeletal: There are no aggressive appearing lytic or blastic lesions noted in the visualized portions of the skeleton. Review of the MIP images confirms the above findings. IMPRESSION: 1. No evidence of pulmonary embolism. 2. Cardiomegaly with evidence of interstitial pulmonary edema; imaging findings concerning for congestive heart failure. 3. Unusual pattern of scattered peribronchovascular predominant ground-glass attenuation nodules. Some of these are similar to prior studies, while others are new. This patient  has demonstrated intermittent waxing and waning ground-glass attenuation in the lungs, the appearance of which on some prior studies suggests cryptogenic organizing pneumonia (COP). Nonemergent pulmonology consultation is recommended in the near future to facilitate outpatient workup to determine the underlying etiology of this pattern. 4. Mild dilatation of the pulmonic trunk (3.4 cm in diameter), concerning for pulmonary arterial hypertension. Electronically Signed   By: Vinnie Langton M.D.   On: 06/14/2021 06:48    EKG: Sinus tachycardia  Weights: Filed Weights   06/13/21 1922  Weight: 64.9 kg     Physical Exam: Blood pressure (!) 135/98, pulse 97, temperature 98.2 F (36.8 C), temperature source Oral, resp. rate 18, height 4' 9"  (1.448 m), weight 64.9 kg, SpO2 97 %. Body mass index is 30.94 kg/m. General: Well developed, well nourished, in no acute distress. Head eyes ears nose throat: Normocephalic, atraumatic, sclera non-icteric, no xanthomas, nares are without discharge. No apparent thyromegaly and/or mass  Lungs: Normal respiratory effort.  no wheezes, no rales, no rhonchi.  Heart: RRR with normal S1 S2. no murmur gallop, no rub, PMI  is normal size and placement, carotid upstroke normal without bruit, jugular venous pressure is normal Abdomen: Soft, non-tender, non-distended with normoactive bowel sounds. No hepatomegaly. No rebound/guarding. No obvious abdominal masses. Abdominal aorta is normal size without bruit Extremities: trace edema. no cyanosis, no clubbing, no ulcers  Peripheral : 2+ bilateral upper extremity pulses, 2+ bilateral femoral pulses, 2+ bilateral dorsal pedal pulse Neuro: Alert and oriented. No facial asymmetry. No focal deficit. Moves all extremities spontaneously. Musculoskeletal: Normal muscle tone without kyphosis Psych:  Responds to questions appropriately with a normal affect.    Assessment: 35 y.o. female with a past medical history of chronic systolic heart failure with last known EF of 25-30% in 12/2019, type 2 diabetes, hypertension and medication noncompliance. Patient with likely acute CHF exacerbation due to medication noncompliance with BNP of 953, improved with IV lasix  Plan: -Continue with lasix for fluid overload and monitor intake/output -Continue current management with carvedilol, Entresto and spironolactone for management of CHF as before in optpt setting when patient not complient -Echocardiogram for evaluation of heart structure and function -continue current management of hypertension and diabetes without changes  Signed, Jettie Booze PA-C [facility] 06/14/2021, 2:53 PM  The patient has been interviewed and examined. I agree with assessment and plan above. Serafina Royals MD [facility]

## 2021-06-15 ENCOUNTER — Other Ambulatory Visit: Payer: Self-pay

## 2021-06-15 ENCOUNTER — Encounter: Payer: Self-pay | Admitting: Internal Medicine

## 2021-06-15 ENCOUNTER — Inpatient Hospital Stay
Admit: 2021-06-15 | Discharge: 2021-06-15 | Disposition: A | Discharge status: Home / Self Care | Payer: Medicaid Other | Attending: Internal Medicine | Admitting: Internal Medicine

## 2021-06-15 DIAGNOSIS — I5043 Acute on chronic combined systolic (congestive) and diastolic (congestive) heart failure: Secondary | ICD-10-CM | POA: Diagnosis not present

## 2021-06-15 LAB — ANA COMPREHENSIVE PANEL
Anti JO-1: <0.2 AI (ref 0.0–0.9)
Centromere Ab Screen: <0.2 AI (ref 0.0–0.9)
Chromatin Ab SerPl-aCnc: <0.2 AI (ref 0.0–0.9)
ENA SM Ab Ser-aCnc: <0.2 AI (ref 0.0–0.9)
Ribonucleic Protein: >8 AI — ABNORMAL HIGH (ref 0.0–0.9)
SSA (Ro) (ENA) Antibody, IgG: <0.2 AI (ref 0.0–0.9)
SSB (La) (ENA) Antibody, IgG: <0.2 AI (ref 0.0–0.9)
Scleroderma (Scl-70) (ENA) Antibody, IgG: 0.9 AI (ref 0.0–0.9)
ds DNA Ab: 1 IU/mL (ref 0–9)

## 2021-06-15 LAB — IRON AND TIBC
Iron: 45 ug/dL (ref 28–170)
Saturation Ratios: 15 % (ref 10.4–31.8)
TIBC: 311 ug/dL (ref 250–450)
UIBC: 266 ug/dL

## 2021-06-15 LAB — RESP PANEL BY RT-PCR (FLU A&B, COVID) ARPGX2
Influenza A by PCR: NEGATIVE
Influenza B by PCR: NEGATIVE
SARS Coronavirus 2 by RT PCR: NEGATIVE

## 2021-06-15 LAB — CBC
HCT: 37.6 % (ref 36.0–46.0)
Hemoglobin: 12.8 g/dL (ref 12.0–15.0)
MCH: 30.8 pg (ref 26.0–34.0)
MCHC: 34 g/dL (ref 30.0–36.0)
MCV: 90.6 fL (ref 80.0–100.0)
Platelets: 627 10*3/uL — ABNORMAL HIGH (ref 150–400)
RBC: 4.15 MIL/uL (ref 3.87–5.11)
RDW: 13.2 % (ref 11.5–15.5)
WBC: 11.3 10*3/uL — ABNORMAL HIGH (ref 4.0–10.5)
nRBC: 0 % (ref 0.0–0.2)

## 2021-06-15 LAB — BASIC METABOLIC PANEL
Anion gap: 7 (ref 5–15)
BUN: 9 mg/dL (ref 6–20)
CO2: 28 mmol/L (ref 22–32)
Calcium: 7.6 mg/dL — ABNORMAL LOW (ref 8.9–10.3)
Chloride: 98 mmol/L (ref 98–111)
Creatinine, Ser: 0.47 mg/dL (ref 0.44–1.00)
GFR, Estimated: >60 mL/min (ref >60)
Glucose, Bld: 370 mg/dL — ABNORMAL HIGH (ref 70–99)
Potassium: 3.1 mmol/L — ABNORMAL LOW (ref 3.5–5.1)
Sodium: 133 mmol/L — ABNORMAL LOW (ref 135–145)

## 2021-06-15 LAB — PHOSPHORUS: Phosphorus: 2.5 mg/dL (ref 2.5–4.6)

## 2021-06-15 LAB — MAGNESIUM: Magnesium: 1.2 mg/dL — ABNORMAL LOW (ref 1.7–2.4)

## 2021-06-15 LAB — FOLATE: Folate: 13.6 ng/mL (ref >5.9)

## 2021-06-15 LAB — CBG MONITORING, ED
Glucose-Capillary: 250 mg/dL — ABNORMAL HIGH (ref 70–99)
Glucose-Capillary: 388 mg/dL — ABNORMAL HIGH (ref 70–99)
Glucose-Capillary: 408 mg/dL — ABNORMAL HIGH (ref 70–99)

## 2021-06-15 LAB — HEMOGLOBIN A1C
Hgb A1c MFr Bld: 13.7 % — ABNORMAL HIGH (ref 4.8–5.6)
Mean Plasma Glucose: 346.49 mg/dL

## 2021-06-15 LAB — STREP PNEUMONIAE URINARY ANTIGEN: Strep Pneumo Urinary Antigen: NEGATIVE

## 2021-06-15 LAB — VITAMIN B12: Vitamin B-12: 514 pg/mL (ref 180–914)

## 2021-06-15 LAB — GLUCOSE, CAPILLARY
Glucose-Capillary: 169 mg/dL — ABNORMAL HIGH (ref 70–99)
Glucose-Capillary: 302 mg/dL — ABNORMAL HIGH (ref 70–99)

## 2021-06-15 MED ORDER — POTASSIUM CHLORIDE CRYS ER 20 MEQ PO TBCR
40.0000 meq | EXTENDED_RELEASE_TABLET | ORAL | Status: AC
  Administered 2021-06-15 (×2): 40 meq via ORAL
  Filled 2021-06-15: qty 4
  Filled 2021-06-15: qty 2

## 2021-06-15 MED ORDER — CARVEDILOL 6.25 MG PO TABS: 6.2500 mg | ORAL_TABLET | Freq: Two times a day (BID) | ORAL | 1 refills | Status: DC

## 2021-06-15 MED ORDER — POTASSIUM CHLORIDE 10 MEQ/100ML IV SOLN
INTRAVENOUS | Status: AC
  Administered 2021-06-15: 10 meq via INTRAVENOUS
  Filled 2021-06-15: qty 100

## 2021-06-15 MED ORDER — POTASSIUM CHLORIDE 10 MEQ/100ML IV SOLN
10.0000 meq | INTRAVENOUS | Status: DC
Start: 2021-06-15 — End: 2021-06-15

## 2021-06-15 MED ORDER — NOVOLOG FLEXPEN 100 UNIT/ML ~~LOC~~ SOPN: 2.0000 [IU] | PEN_INJECTOR | Freq: Three times a day (TID) | SUBCUTANEOUS | 3 refills | Status: DC

## 2021-06-15 MED ORDER — SPIRONOLACTONE 25 MG PO TABS: 25.0000 mg | ORAL_TABLET | Freq: Every day | ORAL | 1 refills | Status: DC

## 2021-06-15 MED ORDER — METFORMIN HCL 500 MG PO TABS: 500.0000 mg | ORAL_TABLET | Freq: Two times a day (BID) | ORAL | 0 refills | Status: DC

## 2021-06-15 MED ORDER — FUROSEMIDE 40 MG PO TABS: 40.0000 mg | ORAL_TABLET | Freq: Every day | ORAL | 0 refills | Status: DC

## 2021-06-15 MED ORDER — POTASSIUM CHLORIDE CRYS ER 20 MEQ PO TBCR: 20.0000 meq | EXTENDED_RELEASE_TABLET | Freq: Every day | ORAL | 0 refills | Status: DC

## 2021-06-15 MED ORDER — INSULIN GLARGINE 100 UNIT/ML SOLOSTAR PEN: 40.0000 [IU] | PEN_INJECTOR | Freq: Every day | SUBCUTANEOUS | 0 refills | Status: DC

## 2021-06-15 MED ORDER — POTASSIUM CHLORIDE CRYS ER 20 MEQ PO TBCR
40.0000 meq | EXTENDED_RELEASE_TABLET | Freq: Once | ORAL | Status: AC
  Administered 2021-06-15: 40 meq via ORAL
  Filled 2021-06-15: qty 2

## 2021-06-15 MED ORDER — ENTRESTO 24-26 MG PO TABS: 1.0000 | ORAL_TABLET | Freq: Two times a day (BID) | ORAL | 11 refills | Status: DC

## 2021-06-15 MED ORDER — ATORVASTATIN CALCIUM 40 MG PO TABS: 40.0000 mg | ORAL_TABLET | Freq: Every day | ORAL | 0 refills | Status: DC

## 2021-06-15 MED ORDER — MAGNESIUM SULFATE 4 GM/100ML IV SOLN
4.0000 g | Freq: Once | INTRAVENOUS | Status: AC
  Administered 2021-06-15: 4 g via INTRAVENOUS
  Filled 2021-06-15 (×2): qty 100

## 2021-06-15 NOTE — Discharge Summary (Signed)
[facility] Discharge Summary   Patient: Cheryl Mercer YTK:[id number]  PCP: Pcp, No  Date of admission: 06/14/2021   Date of discharge:  06/16/2021     Discharge Diagnoses:  Principal Problem:   Acute on chronic combined systolic (congestive) and diastolic (congestive) heart failure ([place]) Active Problems:   Uncontrolled type 2 diabetes mellitus with hyperglycemia ([place])   Essential hypertension   Dilated cardiomyopathy (HCC)   Non compliance w medication regimen   Hyponatremia   Admitted From: Home Disposition:  Home   Recommendations for Outpatient Follow-up:  PCP: 1 wk Cardiology in 1 wk Pulmonologist in 1 week Follow up LABS/TEST: Legionella antigen, histoplasma, Fungitell, Aspergillus   Follow-up Information     Ottie Glazier, MD Follow up in 1 week(s).   Specialty: Pulmonary Disease Contact information: [place] [address] [phone]         Corey Skains, MD Follow up in 1 week(s).   Specialty: Cardiology Contact information: [facility] [place] [address] [phone]         [place], Shaune Pascal, MD Follow up in 1 week(s).   Specialty: Cardiology Contact information: [place] [address] [phone]                Diet recommendation: Carb modified diet and heart healthy, fluid restriction 1.5 L/day  Activity: The patient is advised to gradually reintroduce usual activities, as tolerated  Discharge Condition: stable  Code Status: Full code   History of present illness: As per the H and P dictated on admission  Hospital Course:  Cheryl Mercer is a 35 y.o. female with medical history significant for chronic systolic heart failure with last known LVEF of 25 to 30% from an echo done in 5/21, history of insulin-dependent diabetes mellitus, hypertension and medication noncompliance who presents to the  emergency room for evaluation of intermittent left-sided squeezing chest pain for several days associated with exertional shortness of breath and bilateral lower extremity swelling. Patient has been out of her medications for about 6 months and has not followed up with her primary care provider because she said he retired and she has been unable to establish care with someone else.  She also states that she has been unable to afford her medications as she is uninsured. She denies having any fever, no chills, no cough, no abdominal pain, no urinary symptoms, no dizziness, no lightheadedness, no headache, no palpitations, no diaphoresis, no PND, no orthopnea, no blurred vision no focal deficit. Labs show sodium 130, potassium 3.9, chloride 93, bicarb 29, glucose 550, BUN 5, creatinine 0.59, calcium 8.5, alkaline phosphatase 188, albumin 2.6, AST 28, ALT 14, total protein 7.5, BNP 953, troponin 38 >> 41, white count 10.4, hemoglobin 11.9, hematocrit 34.7, MCV 89.9, RDW 13.2, platelet count 558, D-dimer 1.80 Chest x-ray reviewed by me shows near complete clearing of previously noted right upper lobe pneumonia.  Cardiomegaly without overt pulmonary edema or pleural effusion. CT angiogram of the chest shows no evidence of pulmonary embolism. Cardiomegaly with evidence of interstitial pulmonary edema; imaging findings concerning for congestive heart failure. Unusual pattern of scattered peribronchovascular predominant ground-glass attenuation nodules. Some of these are similar to prior studies, while others are new. This patient has demonstrated intermittent waxing and waning ground-glass attenuation in the lungs, the appearance of which on some prior studies suggests cryptogenic organizing pneumonia (COP). Nonemergent pulmonology consultation is recommended in the near future to facilitate outpatient  workup to determine the underlying etiology of this pattern. Mild dilatation of the pulmonic trunk (3.4 cm in  diameter), concerning for pulmonary arterial hypertension. Twelve-lead EKG reviewed by me shows sinus tachycardia with ST and T wave abnormality.       ED Course: Patient is a 35 year old female with a history of chronic combined systolic and diastolic heart failure, history of medication noncompliance and insulin-dependent diabetes mellitus who presents to the ER for evaluation of chest pain associated with exertional shortness of breath and bilateral lower extremity swelling. Imaging is suggestive of pulmonary edema and patient had significantly elevated blood pressure upon arrival as well as hyperglycemia. She will be admitted to the hospital for further evaluation.  Principal Problem:   Acute on chronic combined systolic (congestive) and diastolic (congestive) heart failure (HCC) Active Problems:   Uncontrolled type 2 diabetes mellitus with hyperglycemia (HCC)   Essential hypertension   Dilated cardiomyopathy (HCC)   Non compliance w medication regimen   Hyponatremia   # Acute on chronic combined systolic and diastolic dysfunction CHF/dilated cardiomyopathy Patient's last known LVEF was 25 to 30% from a 2D echocardiogram which was done in May, 2021 She presents for evaluation of exertional shortness of breath and bilateral lower extremity swelling and has been noncompliant with prescribed medications. S/p Lasix 40 mg IV daily, resumed carvedilol, Entresto and spironolactone, resumed Lasix 40 mg daily.  Potassium chloride 20 M EQ's daily.  Patient was advised to continue heart healthy diet and fluids again 1.5 L/day.  Cardiology was consulted, recommended continue same treatment and follow-up as an outpatient.  2D echocardiogram is done but report is still pending.  Follow with cardiology for TTE report and for further recommendation  # Community-acquired pneumonia, patient was recently treated with antibiotics.  Pulmonary was consulted, COVID-19 negative, respiratory viral panel negative,  strep pneumonia antigen negative, Legionella antibody pending, histoplasma urine antigen pending, Fungitell and Aspergillus pending.  Clinically patient was stable, saturating well on room air, denies any chest pain or shortness of breath.  Patient was advised to follow with pulmonologist as an outpatient for further management and pending reports. # Hypertension, Uncontrolled and secondary to medication noncompliance. Resumed carvedilol and Entresto, Lasix and spironolactone resumed as well.  BP was well controlled during hospital stay after these medications.  Patient was advised to continue current treatment, monitor BP at home and follow with PCP and cardiology. # Diabetes mellitus with hyperglycemia, secondary to medication noncompliance, Resume Lantus 40 units daily and sliding scale coverage with NovoLog, Diabetic education. Maintain consistent carbohydrate diet # Medication noncompliance, Patient has been noncompliant with her medications due to financial reasons and being uninsured.  Currently shows that patient has Medicaid pre paid health plan. # Hyponatremia, most likely due to hyperglycemia, resolved after correction of blood glucose.   # Hypokalemia, potassium repleted.  Resolved. # Hypomagnesemia, mag repleted.  Resolved. # Vitamin D deficiency, started vitamin D 59 units p.o. weekly. Body mass index is 29.78 kg/m.  Nutrition Interventions:     Patient was ambulatory without any assistance. On the day of the discharge the patient's vitals were stable, and no other acute medical condition were reported by patient. the patient was felt safe to be discharge at Home.  Consultants: Cardiologist and pulmonologist Procedures: None  Discharge Exam: General: Appear in no distress, no Rash; Oral Mucosa Clear, moist. Cardiovascular: S1 and S2 Present, no Murmur, Respiratory: normal respiratory effort, Bilateral Air entry present and no Crackles, no wheezes Abdomen: Bowel Sound present,  Soft and  no tenderness, no hernia Extremities: no Pedal edema, no calf tenderness Neurology: alert and oriented to time, place, and person affect appropriate.  Filed Weights   06/13/21 1922 06/15/21 1510 06/16/21 0415  Weight: 64.9 kg 62.4 kg 63.9 kg   Vitals:   06/16/21 0805 06/16/21 1200  BP: (!) 150/114 102/74  Pulse: (!) 108 92  Resp: 20 17  Temp: (!) 97.4 F (36.3 C) 98.7 F (37.1 C)  SpO2: 100% 100%    DISCHARGE MEDICATION: Allergies as of 06/16/2021       Reactions   Ibuprofen Anaphylaxis        Medication List     TAKE these medications    atorvastatin 40 MG tablet Commonly known as: LIPITOR Take 1 tablet (40 mg total) by mouth daily at 6 PM.   blood glucose meter kit and supplies Dispense based on patient and insurance preference. Check blood sugar 4 times daily, three times before meals and once before bedtime. (FOR ICD-10 E10.9, E11.9).   carvedilol 6.25 MG tablet Commonly known as: COREG Take 1 tablet (6.25 mg total) by mouth 2 (two) times daily with a meal.   Entresto 24-26 MG Generic drug: sacubitril-valsartan Take 1 tablet by mouth 2 (two) times daily.   furosemide 40 MG tablet Commonly known as: LASIX Take 1 tablet (40 mg total) by mouth daily. What changed:  medication strength how much to take   gabapentin 300 MG capsule Commonly known as: NEURONTIN Take 1 capsule (300 mg total) by mouth 3 (three) times daily.   insulin glargine 100 UNIT/ML Solostar Pen Commonly known as: LANTUS Inject 40 Units into the skin daily.   metFORMIN 500 MG tablet Commonly known as: GLUCOPHAGE Take 1 tablet (500 mg total) by mouth 2 (two) times daily with a meal.   NovoLOG FlexPen 100 UNIT/ML FlexPen Generic drug: insulin aspart Inject 2-15 Units into the skin 3 (three) times daily after meals. CBG 121 - 150: 2 units, 151 - 200: 3 units, 201 - 250: 5 units, 251 - 300: 8 units, 301 - 350: 11 units, 351 - 400: 15u What changed:  how much to take when  to take this additional instructions   potassium chloride SA 20 MEQ tablet Commonly known as: KLOR-CON Take 1 tablet (20 mEq total) by mouth daily.   spironolactone 25 MG tablet Commonly known as: ALDACTONE Take 1 tablet (25 mg total) by mouth daily.   Vitamin D (Ergocalciferol) 1.25 MG (50000 UNIT) Caps capsule Commonly known as: DRISDOL Take 1 capsule (50,000 Units total) by mouth every 7 (seven) days. Start taking on: June 23, 2021       Allergies  Allergen Reactions   Ibuprofen Anaphylaxis   Discharge Instructions     Call MD for:  difficulty breathing, headache or visual disturbances   Complete by: As directed    Call MD for:  extreme fatigue   Complete by: As directed    Call MD for:  persistant dizziness or light-headedness   Complete by: As directed    Call MD for:  persistant nausea and vomiting   Complete by: As directed    Call MD for:  severe uncontrolled pain   Complete by: As directed    Diet - low sodium heart healthy   Complete by: As directed    Discharge instructions   Complete by: As directed    Follow-up with PCP in 1 week, repeat chest x-ray after 4 weeks, repeat vitamin D level after 3 months. Follow-up with  pulmonologist in 1 week, follow pending work-up for Legionella, histoplasma and Fungitell and Aspergillus. Follow-up with cardiology in 1 week   Heart Failure patients record your daily weight using the same scale at the same time of day   Complete by: As directed    Increase activity slowly   Complete by: As directed        The results of significant diagnostics from this hospitalization (including imaging, microbiology, ancillary and laboratory) are listed below for reference.    Significant Diagnostic Studies: DG Chest 2 View  Result Date: 06/13/2021 CLINICAL DATA:  Chest pain EXAM: CHEST - 2 VIEW COMPARISON:  06/03/2021, CT 07/02/2020 FINDINGS: Mild cardiomegaly. Near complete clearing of right upper lobe pneumonia. No pleural  effusion or pneumothorax. Mild chronic reticular opacity in the lung bases IMPRESSION: 1. Near complete clearing of previously noted right upper lobe pneumonia 2. Cardiomegaly without overt pulmonary edema or pleural effusion Electronically Signed   By: Donavan Foil M.D.   On: 06/13/2021 19:48   DG Chest 2 View  Result Date: 06/03/2021 CLINICAL DATA:  cough chills EXAM: CHEST - 2 VIEW COMPARISON:  Radiograph 07/09/2020 FINDINGS: Unchanged cardiomediastinal silhouette. There are patchy airspace opacities in the right upper lobe. No pleural effusion or visible pneumothorax. Thickening of the right major and minor fissures. There is no acute osseous abnormality. IMPRESSION: Right upper lobe pneumonia. Electronically Signed   By: Maurine Simmering M.D.   On: 06/03/2021 13:57   CT Angio Chest PE W and/or Wo Contrast  Result Date: 06/14/2021 CLINICAL DATA:  35 year old female with history of chest pain for the past few days. Evaluate for pulmonary embolism. EXAM: CT ANGIOGRAPHY CHEST WITH CONTRAST TECHNIQUE: Multidetector CT imaging of the chest was performed using the standard protocol during bolus administration of intravenous contrast. Multiplanar CT image reconstructions and MIPs were obtained to evaluate the vascular anatomy. CONTRAST:  40m OMNIPAQUE IOHEXOL 350 MG/ML SOLN COMPARISON:  Chest CT 07/02/2020. FINDINGS: Cardiovascular: No filling defects in the pulmonary arterial tree to suggest pulmonary embolism. Heart size is moderately enlarged. There is no significant pericardial fluid, thickening or pericardial calcification. No atherosclerotic calcifications are noted in the thoracic aorta or the coronary arteries. Mild dilatation of the pulmonic trunk (3.4 cm in diameter). Mediastinum/Nodes: No pathologically enlarged mediastinal or hilar lymph nodes. Several prominent borderline enlarged mediastinal and hilar lymph nodes are noted, similar to prior examinations, nonspecific but presumably chronic and  reactive. Esophagus is unremarkable in appearance. No axillary lymphadenopathy. Lungs/Pleura: There is a background of mild diffuse ground-glass attenuation and interlobular septal thickening, suggesting mild interstitial pulmonary edema. In addition, there are patchy areas of peribronchovascular ground-glass attenuation, most of which are nodular in appearance. Some of these are similar to prior studies, while others are new. The largest of these lesions is in the left lower lobe (axial image 63 of series 6) measuring 1.3 x 1.0 cm. Small thick-walled cavity also noted in the periphery of the left lower lobe, new compared to prior studies. No confluent consolidative airspace disease. No pleural effusions. Upper Abdomen: Unremarkable. Musculoskeletal: There are no aggressive appearing lytic or blastic lesions noted in the visualized portions of the skeleton. Review of the MIP images confirms the above findings. IMPRESSION: 1. No evidence of pulmonary embolism. 2. Cardiomegaly with evidence of interstitial pulmonary edema; imaging findings concerning for congestive heart failure. 3. Unusual pattern of scattered peribronchovascular predominant ground-glass attenuation nodules. Some of these are similar to prior studies, while others are new. This patient has demonstrated intermittent waxing  and waning ground-glass attenuation in the lungs, the appearance of which on some prior studies suggests cryptogenic organizing pneumonia (COP). Nonemergent pulmonology consultation is recommended in the near future to facilitate outpatient workup to determine the underlying etiology of this pattern. 4. Mild dilatation of the pulmonic trunk (3.4 cm in diameter), concerning for pulmonary arterial hypertension. Electronically Signed   By: Vinnie Langton M.D.   On: 06/14/2021 06:48    Microbiology: Recent Results (from the past 240 hour(s))  MRSA Next Gen by PCR, Nasal     Status: Abnormal   Collection Time: 06/14/21 10:34 AM    Specimen: Nasal Mucosa; Nasal Swab  Result Value Ref Range Status   MRSA by PCR Next Gen DETECTED (A) NOT DETECTED Final    Comment: RESULT CALLED TO, READ BACK BY AND VERIFIED WITH: MATHEW WRIGHT @1220  06/14/21 MJU (NOTE) The GeneXpert MRSA Assay (FDA approved for NASAL specimens only), is one component of a comprehensive MRSA colonization surveillance program. It is not intended to diagnose MRSA infection nor to guide or monitor treatment for MRSA infections. Test performance is not FDA approved in patients less than 30 years old. Performed at [facility], [place], [place] [address]   Respiratory (~20 pathogens) panel by PCR     Status: None   Collection Time: 06/14/21 10:34 AM   Specimen: Nasopharyngeal Swab; Respiratory  Result Value Ref Range Status   Adenovirus NOT DETECTED NOT DETECTED Final   Coronavirus 229E NOT DETECTED NOT DETECTED Final    Comment: (NOTE) The Coronavirus on the Respiratory Panel, DOES NOT test for the novel  Coronavirus (2019 nCoV)    Coronavirus HKU1 NOT DETECTED NOT DETECTED Final   Coronavirus NL63 NOT DETECTED NOT DETECTED Final   Coronavirus OC43 NOT DETECTED NOT DETECTED Final   Metapneumovirus NOT DETECTED NOT DETECTED Final   Rhinovirus / Enterovirus NOT DETECTED NOT DETECTED Final   Influenza A NOT DETECTED NOT DETECTED Final   Influenza B NOT DETECTED NOT DETECTED Final   Parainfluenza Virus 1 NOT DETECTED NOT DETECTED Final   Parainfluenza Virus 2 NOT DETECTED NOT DETECTED Final   Parainfluenza Virus 3 NOT DETECTED NOT DETECTED Final   Parainfluenza Virus 4 NOT DETECTED NOT DETECTED Final   Respiratory Syncytial Virus NOT DETECTED NOT DETECTED Final   Bordetella pertussis NOT DETECTED NOT DETECTED Final   Bordetella Parapertussis NOT DETECTED NOT DETECTED Final   Chlamydophila pneumoniae NOT DETECTED NOT DETECTED Final   Mycoplasma pneumoniae NOT DETECTED NOT DETECTED Final    Comment: Performed at [facility], [place]. [address], [place], [place] [address]  Resp Panel by RT-PCR (Flu A&B, Covid) Nasopharyngeal Swab     Status: None   Collection Time: 06/15/21  9:04 PM   Specimen: Nasopharyngeal Swab; Nasopharyngeal(NP) swabs in vial transport medium  Result Value Ref Range Status   SARS Coronavirus 2 by RT PCR NEGATIVE NEGATIVE Final    Comment: (NOTE) SARS-CoV-2 target nucleic acids are NOT DETECTED.  The SARS-CoV-2 RNA is generally detectable in upper respiratory specimens during the acute phase of infection. The lowest concentration of SARS-CoV-2 viral copies this assay can detect is 138 copies/mL. A negative result does not preclude SARS-Cov-2 infection and should not be used as the sole basis for treatment or other patient management decisions. A negative result may occur with  improper specimen collection/handling, submission of specimen other than nasopharyngeal swab, presence of viral mutation(s) within the areas targeted by this assay, and inadequate number of viral  copies(<138 copies/mL). A negative result must be combined with clinical observations, patient history, and epidemiological information. The expected result is Negative.  Fact Sheet for Patients:  EntrepreneurPulse.com.au  Fact Sheet for Healthcare Providers:  IncredibleEmployment.be  This test is no t yet approved or cleared by the [place] FDA and  has been authorized for detection and/or diagnosis of SARS-CoV-2 by FDA under an Emergency Use Authorization (EUA). This EUA will remain  in effect (meaning this test can be used) for the duration of the COVID-19 declaration under Section 564(b)(1) of the Act, 21 U.S.C.section 360bbb-3(b)(1), unless the authorization is terminated  or revoked sooner.       Influenza A by PCR NEGATIVE NEGATIVE Final   Influenza B by PCR NEGATIVE NEGATIVE Final    Comment: (NOTE) The Xpert Xpress SARS-CoV-2/FLU/RSV plus assay is intended as  an aid in the diagnosis of influenza from Nasopharyngeal swab specimens and should not be used as a sole basis for treatment. Nasal washings and aspirates are unacceptable for Xpert Xpress SARS-CoV-2/FLU/RSV testing.  Fact Sheet for Patients: EntrepreneurPulse.com.au  Fact Sheet for Healthcare Providers: IncredibleEmployment.be  This test is not yet approved or cleared by the [place] FDA and has been authorized for detection and/or diagnosis of SARS-CoV-2 by FDA under an Emergency Use Authorization (EUA). This EUA will remain in effect (meaning this test can be used) for the duration of the COVID-19 declaration under Section 564(b)(1) of the Act, 21 U.S.C. section 360bbb-3(b)(1), unless the authorization is terminated or revoked.  Performed at [facility], [address] [place], [place] [address]      Labs: CBC: Recent Labs  Lab 06/13/21 1932 06/14/21 1100 06/15/21 0700 06/16/21 0554  WBC 10.4 13.5* 11.3* 13.6*  HGB 11.9* 12.4 12.8 12.0  HCT 34.7* 34.8* 37.6 34.8*  MCV 89.9 87.9 90.6 90.9  PLT 558* 591* 627* 472*   Basic Metabolic Panel: Recent Labs  Lab 06/13/21 1932 06/14/21 1100 06/15/21 0700 06/15/21 1337 06/16/21 0554  NA 130*  --  133*  --  133*  K 3.9  --  3.1*  --  4.3  CL 93*  --  98  --  100  CO2 29  --  28  --  26  GLUCOSE 550*  --  370*  --  312*  BUN 5*  --  9  --  13  CREATININE 0.59 0.46 0.47  --  0.57  CALCIUM 8.5*  --  7.6*  --  8.0*  MG  --   --   --  1.2* 1.8  PHOS  --   --   --  2.5 2.9   Liver Function Tests: Recent Labs  Lab 06/13/21 1932  AST 28  ALT 14  ALKPHOS 198*  BILITOT 0.6  PROT 7.5  ALBUMIN 2.6*   No results for input(s): LIPASE, AMYLASE in the last 168 hours. No results for input(s): AMMONIA in the last 168 hours. Cardiac Enzymes: No results for input(s): CKTOTAL, CKMB, CKMBINDEX, TROPONINI in the last 168 hours. BNP (last 3 results) Recent Labs     07/09/20 1158 07/09/20 2109 06/13/21 1932  BNP 705.0* 856.0* 953.8*   CBG: Recent Labs  Lab 06/15/21 1351 06/15/21 1646 06/15/21 2011 06/16/21 0806 06/16/21 1215  GLUCAP 250* 169* 302* 273* 195*    Time spent: 35 minutes  Signed:  Val Riles  [facility]  06/16/2021 12:45 PM

## 2021-06-15 NOTE — ED Notes (Signed)
Echo at bedside

## 2021-06-15 NOTE — Progress Notes (Signed)
*  PRELIMINARY RESULTS* Echocardiogram 2D Echocardiogram has been performed.  Cheryl Mercer Lyndsi Altic 06/15/2021, 9:45 AM

## 2021-06-15 NOTE — ED Notes (Signed)
Pt provided with specimen cup to provide urine sample. Pt states she just went to [facility] and was unaware of needed sample. Pt verbalizes understanding to provide sample the next time she voids. Bedside table provided to pt.

## 2021-06-15 NOTE — ED Notes (Signed)
Informed RN bed assigned 

## 2021-06-15 NOTE — TOC CM/SW Note (Signed)
TOC consulted to verify if patient has Medicaid and provide PCP information.  Attempted call to Registration, unable to reach.   Spoke with patient via phone due to Contact Precautions. Explained that per patient's chart, it says she does have Medicaid. DSS is closed until Monday but patient can call Monday to verify coverage. If patient has Medicaid, they would assign her a PCP.   Printed and explained [facility] application as a back up in case patient does not have Medicaid or any health insurance. Asked RN to give to patient due to isolation.  Patient verbalized understanding. TOC can follow up Monday as well if patient is still in the hospital.  [facility], LCSW [phone]

## 2021-06-15 NOTE — Progress Notes (Signed)
Pt has agreed to stay in hospital to be treated / MD made aware/ discharge order discontinued

## 2021-06-15 NOTE — ED Notes (Signed)
Lab called for patient labs.

## 2021-06-15 NOTE — ED Notes (Signed)
Spoke with patient about wanting to be sent home. Pt endorsing that they would like to be discharged home with oral potassium or even receive IV potassium here and then be sent home. Pt is attempting to find someone to care for her child at home who will need cared for. Md messaged securely with update.

## 2021-06-15 NOTE — ED Notes (Addendum)
Pt experiencing pain at IV site for IV potassium. MD aware. PT asking for oral mediation, or even a rx for at home, to not delay discharge as patient has to get home to care for their children.MR aware.

## 2021-06-15 NOTE — Progress Notes (Signed)
[facility] Cardiology [facility] Encounter Note  Patient: Cheryl Mercer / Admit Date: 06/14/2021 / Date of Encounter: 06/15/2021, 6:35 AM   Subjective: Patient is significantly improved from admission to the hospital.  Significant urine output with weight loss and improvements of lower extremity edema and shortness of breath.  The patient has not had any login of exact amount of urine output but significantly improved.  She has been replaced on her previous appropriate medication management including spironolactone and Entresto furosemide carvedilol combination which has helped before.  She also has had previous history of pneumonia which appears to be relatively stable at this time and exam suggest no evidence of pulmonary changes.  She is hemodynamically stable  Review of Systems: Positive for: Shortness of breath Negative for: Vision change, hearing change, syncope, dizziness, nausea, vomiting,diarrhea, bloody stool, stomach pain, cough, congestion, diaphoresis, urinary frequency, urinary pain,skin lesions, skin rashes Others previously listed  Objective: Telemetry: Normal sinus rhythm Physical Exam: Blood pressure (!) 133/92, pulse 97, temperature 97.8 F (36.6 C), temperature source Oral, resp. rate 18, height 4\' 9"  (1.448 m), weight 64.9 kg, SpO2 100 %. Body mass index is 30.94 kg/m. General: Well developed, well nourished, in no acute distress. Head: Normocephalic, atraumatic, sclera non-icteric, no xanthomas, nares are without discharge. Neck: No apparent masses Lungs: Normal respirations with no wheezes, no rhonchi, no rales , no crackles   Heart: Regular rate and rhythm, normal S1 S2, no murmur, no rub, no gallop, PMI is normal size and placement, carotid upstroke normal without bruit, jugular venous pressure normal Abdomen: Soft, non-tender, non-distended with normoactive bowel sounds. No hepatosplenomegaly. Abdominal aorta is normal size without bruit Extremities: No  edema, no clubbing, no cyanosis, no ulcers,  Peripheral: 2+ radial, 2+ femoral, 2+ dorsal pedal pulses Neuro: Alert and oriented. Moves all extremities spontaneously. Psych:  Responds to questions appropriately with a normal affect.  No intake or output data in the 24 hours ending 06/15/21 0635  Inpatient Medications:   atorvastatin  40 mg Oral q1800   carvedilol  6.25 mg Oral BID WC   enoxaparin (LOVENOX) injection  40 mg Subcutaneous Q24H   furosemide  40 mg Intravenous Daily   insulin aspart  0-15 Units Subcutaneous TID WC   insulin glargine-yfgn  40 Units Subcutaneous Daily   sacubitril-valsartan  1 tablet Oral BID   spironolactone  25 mg Oral Daily   Infusions:   Labs: Recent Labs    06/13/21 1932 06/14/21 1100  NA 130*  --   K 3.9  --   CL 93*  --   CO2 29  --   GLUCOSE 550*  --   BUN 5*  --   CREATININE 0.59 0.46  CALCIUM 8.5*  --    Recent Labs    06/13/21 1932  AST 28  ALT 14  ALKPHOS 198*  BILITOT 0.6  PROT 7.5  ALBUMIN 2.6*   Recent Labs    06/13/21 1932 06/14/21 1100  WBC 10.4 13.5*  HGB 11.9* 12.4  HCT 34.7* 34.8*  MCV 89.9 87.9  PLT 558* 591*   No results for input(s): CKTOTAL, CKMB, TROPONINI in the last 72 hours. Invalid input(s): POCBNP No results for input(s): HGBA1C in the last 72 hours.   Weights: Filed Weights   06/13/21 1922  Weight: 64.9 kg     Radiology/Studies:  DG Chest 2 View  Result Date: 06/13/2021 CLINICAL DATA:  Chest pain EXAM: CHEST - 2 VIEW COMPARISON:  06/03/2021, CT 07/02/2020 FINDINGS: Mild cardiomegaly. Near  complete clearing of right upper lobe pneumonia. No pleural effusion or pneumothorax. Mild chronic reticular opacity in the lung bases IMPRESSION: 1. Near complete clearing of previously noted right upper lobe pneumonia 2. Cardiomegaly without overt pulmonary edema or pleural effusion Electronically Signed   By: Jasmine Pang M.D.   On: 06/13/2021 19:48   DG Chest 2 View  Result Date:  06/03/2021 CLINICAL DATA:  cough chills EXAM: CHEST - 2 VIEW COMPARISON:  Radiograph 07/09/2020 FINDINGS: Unchanged cardiomediastinal silhouette. There are patchy airspace opacities in the right upper lobe. No pleural effusion or visible pneumothorax. Thickening of the right major and minor fissures. There is no acute osseous abnormality. IMPRESSION: Right upper lobe pneumonia. Electronically Signed   By: Caprice Renshaw M.D.   On: 06/03/2021 13:57   CT Angio Chest PE W and/or Wo Contrast  Result Date: 06/14/2021 CLINICAL DATA:  35 year old female with history of chest pain for the past few days. Evaluate for pulmonary embolism. EXAM: CT ANGIOGRAPHY CHEST WITH CONTRAST TECHNIQUE: Multidetector CT imaging of the chest was performed using the standard protocol during bolus administration of intravenous contrast. Multiplanar CT image reconstructions and MIPs were obtained to evaluate the vascular anatomy. CONTRAST:  70mL OMNIPAQUE IOHEXOL 350 MG/ML SOLN COMPARISON:  Chest CT 07/02/2020. FINDINGS: Cardiovascular: No filling defects in the pulmonary arterial tree to suggest pulmonary embolism. Heart size is moderately enlarged. There is no significant pericardial fluid, thickening or pericardial calcification. No atherosclerotic calcifications are noted in the thoracic aorta or the coronary arteries. Mild dilatation of the pulmonic trunk (3.4 cm in diameter). Mediastinum/Nodes: No pathologically enlarged mediastinal or hilar lymph nodes. Several prominent borderline enlarged mediastinal and hilar lymph nodes are noted, similar to prior examinations, nonspecific but presumably chronic and reactive. Esophagus is unremarkable in appearance. No axillary lymphadenopathy. Lungs/Pleura: There is a background of mild diffuse ground-glass attenuation and interlobular septal thickening, suggesting mild interstitial pulmonary edema. In addition, there are patchy areas of peribronchovascular ground-glass attenuation, most of which  are nodular in appearance. Some of these are similar to prior studies, while others are new. The largest of these lesions is in the left lower lobe (axial image 63 of series 6) measuring 1.3 x 1.0 cm. Small thick-walled cavity also noted in the periphery of the left lower lobe, new compared to prior studies. No confluent consolidative airspace disease. No pleural effusions. Upper Abdomen: Unremarkable. Musculoskeletal: There are no aggressive appearing lytic or blastic lesions noted in the visualized portions of the skeleton. Review of the MIP images confirms the above findings. IMPRESSION: 1. No evidence of pulmonary embolism. 2. Cardiomegaly with evidence of interstitial pulmonary edema; imaging findings concerning for congestive heart failure. 3. Unusual pattern of scattered peribronchovascular predominant ground-glass attenuation nodules. Some of these are similar to prior studies, while others are new. This patient has demonstrated intermittent waxing and waning ground-glass attenuation in the lungs, the appearance of which on some prior studies suggests cryptogenic organizing pneumonia (COP). Nonemergent pulmonology consultation is recommended in the near future to facilitate outpatient workup to determine the underlying etiology of this pattern. 4. Mild dilatation of the pulmonic trunk (3.4 cm in diameter), concerning for pulmonary arterial hypertension. Electronically Signed   By: Trudie Reed M.D.   On: 06/14/2021 06:48     Assessment and Recommendation  35 y.o. female with acute on chronic systolic dysfunction congestive heart failure diabetes hypertension pulmonary hypertension multifactorial in nature including recent pneumonia and noncompliance now significantly improved without evidence of acute coronary syndrome 1.  Continue reinstatement  of medication management as patient tolerates including spironolactone and Entresto carvedilol for hypertension and cardiomyopathy at previously dosages 2.   Reinstatement of furosemide at 20 mg p.o.daily for discharge to home 3.  Other risk factor modification and diabetes and hyperlipidemia treatment as before with no change in dosages 4.  Begin ambulation and follow-up for improvements of symptoms and okay for discharge home from cardiac standpoint with follow-up in 1 to 2 weeks for further adjustments of medication management 5.  Call if further questions  Signed, Arnoldo Hooker M.D. FACC

## 2021-06-15 NOTE — ED Notes (Signed)
Breakfast tray delivered

## 2021-06-16 DIAGNOSIS — I5043 Acute on chronic combined systolic (congestive) and diastolic (congestive) heart failure: Secondary | ICD-10-CM | POA: Diagnosis not present

## 2021-06-16 LAB — CBC
HCT: 34.8 % — ABNORMAL LOW (ref 36.0–46.0)
Hemoglobin: 12 g/dL (ref 12.0–15.0)
MCH: 31.3 pg (ref 26.0–34.0)
MCHC: 34.5 g/dL (ref 30.0–36.0)
MCV: 90.9 fL (ref 80.0–100.0)
Platelets: 632 10*3/uL — ABNORMAL HIGH (ref 150–400)
RBC: 3.83 MIL/uL — ABNORMAL LOW (ref 3.87–5.11)
RDW: 13.2 % (ref 11.5–15.5)
WBC: 13.6 10*3/uL — ABNORMAL HIGH (ref 4.0–10.5)
nRBC: 0 % (ref 0.0–0.2)

## 2021-06-16 LAB — RESPIRATORY PANEL BY PCR

## 2021-06-16 LAB — BASIC METABOLIC PANEL
Anion gap: 7 (ref 5–15)
BUN: 13 mg/dL (ref 6–20)
CO2: 26 mmol/L (ref 22–32)
Calcium: 8 mg/dL — ABNORMAL LOW (ref 8.9–10.3)
Chloride: 100 mmol/L (ref 98–111)
Creatinine, Ser: 0.57 mg/dL (ref 0.44–1.00)
GFR, Estimated: >60 mL/min (ref >60)
Glucose, Bld: 312 mg/dL — ABNORMAL HIGH (ref 70–99)
Potassium: 4.3 mmol/L (ref 3.5–5.1)
Sodium: 133 mmol/L — ABNORMAL LOW (ref 135–145)

## 2021-06-16 LAB — PHOSPHORUS: Phosphorus: 2.9 mg/dL (ref 2.5–4.6)

## 2021-06-16 LAB — VITAMIN D 25 HYDROXY (VIT D DEFICIENCY, FRACTURES): Vit D, 25-Hydroxy: 10.45 ng/mL — ABNORMAL LOW (ref 30–100)

## 2021-06-16 LAB — GLUCOSE, CAPILLARY
Glucose-Capillary: 273 mg/dL — ABNORMAL HIGH (ref 70–99)
Glucose-Capillary: 195 mg/dL — ABNORMAL HIGH (ref 70–99)

## 2021-06-16 LAB — MAGNESIUM: Magnesium: 1.8 mg/dL (ref 1.7–2.4)

## 2021-06-16 MED ORDER — VITAMIN D (ERGOCALCIFEROL) 1.25 MG (50000 UNIT) PO CAPS
50000.0000 [IU] | ORAL_CAPSULE | ORAL | Status: DC
Start: 2021-06-16 — End: 2021-06-16
  Administered 2021-06-16: 50000 [IU] via ORAL
  Filled 2021-06-16: qty 1

## 2021-06-16 MED ORDER — MAGNESIUM OXIDE -MG SUPPLEMENT 400 (240 MG) MG PO TABS
400.0000 mg | ORAL_TABLET | Freq: Two times a day (BID) | ORAL | Status: DC
  Administered 2021-06-16: 400 mg via ORAL
  Filled 2021-06-16: qty 1

## 2021-06-16 MED ORDER — VITAMIN D (ERGOCALCIFEROL) 1.25 MG (50000 UNIT) PO CAPS: 50000.0000 [IU] | ORAL_CAPSULE | ORAL | 0 refills | Status: AC

## 2021-06-16 NOTE — Progress Notes (Signed)
[facility] Progress Note  Patient: Cheryl Mercer    DGL:[id number]  DOA: 06/14/2021     Date of Service: the patient was seen and examined on 06/15/2021  Chief Complaint  Patient presents with   Edema   Brief hospital course: Cheryl Mercer is a 35 y.o. female with medical history significant for chronic systolic heart failure with last known LVEF of 25 to 30% from an echo done in 5/21, history of insulin-dependent diabetes mellitus, hypertension and medication noncompliance who presents to the emergency room for evaluation of intermittent left-sided squeezing chest pain for several days associated with exertional shortness of breath and bilateral lower extremity swelling. Patient has been out of her medications for about 6 months and has not followed up with her primary care provider because she said he retired and she has been unable to establish care with someone else.  She also states that she has been unable to afford her medications as she is uninsured. She denies having any fever, no chills, no cough, no abdominal pain, no urinary symptoms, no dizziness, no lightheadedness, no headache, no palpitations, no diaphoresis, no PND, no orthopnea, no blurred vision no focal deficit. Labs show sodium 130, potassium 3.9, chloride 93, bicarb 29, glucose 550, BUN 5, creatinine 0.59, calcium 8.5, alkaline phosphatase 188, albumin 2.6, AST 28, ALT 14, total protein 7.5, BNP 953, troponin 38 >> 41, white count 10.4, hemoglobin 11.9, hematocrit 34.7, MCV 89.9, RDW 13.2, platelet count 558, D-dimer 1.80 Chest x-ray reviewed by me shows near complete clearing of previously noted right upper lobe pneumonia.  Cardiomegaly without overt pulmonary edema or pleural effusion. CT angiogram of the chest shows no evidence of pulmonary embolism. Cardiomegaly with evidence of interstitial pulmonary edema; imaging findings concerning for congestive heart failure. Unusual pattern of scattered  peribronchovascular predominant ground-glass attenuation nodules. Some of these are similar to prior studies, while others are new. This patient has demonstrated intermittent waxing and waning ground-glass attenuation in the lungs, the appearance of which on some prior studies suggests cryptogenic organizing pneumonia (COP). Nonemergent pulmonology consultation is recommended in the near future to facilitate outpatient workup to determine the underlying etiology of this pattern. Mild dilatation of the pulmonic trunk (3.4 cm in diameter), concerning for pulmonary arterial hypertension. Twelve-lead EKG reviewed by me shows sinus tachycardia with ST and T wave abnormality.       ED Course: Patient is a 35 year old female with a history of chronic combined systolic and diastolic heart failure, history of medication noncompliance and insulin-dependent diabetes mellitus who presents to the ER for evaluation of chest pain associated with exertional shortness of breath and bilateral lower extremity swelling. Imaging is suggestive of pulmonary edema and patient had significantly elevated blood pressure upon arrival as well as hyperglycemia. She will be admitted to the hospital for further evaluation.   Assessment and Plan: Principal Problem:   Acute on chronic combined systolic (congestive) and diastolic (congestive) heart failure (HCC) Active Problems:   Uncontrolled type 2 diabetes mellitus with hyperglycemia (HCC)   Essential hypertension   Dilated cardiomyopathy (HCC)   Non compliance w medication regimen   Hyponatremia   # Acute on chronic combined systolic and diastolic dysfunction CHF/dilated cardiomyopathy Patient's last known LVEF was 25 to 30% from a 2D echocardiogram which was done in May, 2021 She presents for evaluation of exertional shortness of breath and bilateral lower extremity swelling and has been noncompliant with prescribed medications Will place patient on Lasix 40 mg IV  daily Continue  carvedilol, Entresto and spironolactone Maintain low-sodium diet Repeat 2D echocardiogram to assess LVEF Consult cardiology   Hypokalemia secondary to diuresis, potassium repleted.  Hypomagnesemia, mag repleted Hypertension, Uncontrolled and secondary to medication noncompliance resumed carvedilol and Entresto Maintain low-sodium diet   Diabetes mellitus with hyperglycemia, Secondary to medication noncompliance Resume Lantus 40 units daily and place patient on sliding scale coverage with NovoLog Diabetic education Maintain consistent carbohydrate diet   Medication noncompliance Patient has been noncompliant with her medications due to financial reasons and being uninsured We will request TOC consult     Body mass index is 30.48 kg/m.  Interventions:     Diet: Carb modified and heart healthy diet, for restriction one-point Falta per day DVT Prophylaxis: Subcutaneous Lovenox   Advance goals of care discussion: Full code  Family Communication: family was present at bedside, at the time of interview.  The pt provided permission to discuss medical plan with the family. Opportunity was given to ask question and all questions were answered satisfactorily.   Disposition:  Pt is from Home, admitted with CHF, still has edema, hypokalemia, hypomagnesemia which precludes a safe discharge. Discharge to home, when stable.  Subjective: Patient feels improvement in the shortness of breath, denies any chest pain, lower extremity edema is also improving.  Denies any palpitations, no any other active issues.   Physical Exam: General:  alert oriented to time, place, and person.  Appear in mild distress, affect appropriate Eyes: PERRLA ENT: Oral Mucosa Clear, moist  Neck: no JVD,  Cardiovascular: S1 and S2 Present, no Murmur,  Respiratory: good respiratory effort, Bilateral Air entry equal and Decreased, no Crackles, no wheezes Abdomen: Bowel Sound present, Soft and no  tenderness,  Skin: no rashes Extremities: 2+ Pedal edema, no calf tenderness Neurologic: without any new focal findings Gait not checked due to patient safety concerns  Vitals:   06/15/21 2349 06/16/21 0415 06/16/21 0805 06/16/21 1200  BP: (!) 132/94 (!) 135/104 (!) 150/114 102/74  Pulse: (!) 103 (!) 104 (!) 108 92  Resp: 18 18 20 17   Temp: 98.2 F (36.8 C) 98.4 F (36.9 C) (!) 97.4 F (36.3 C) 98.7 F (37.1 C)  TempSrc:      SpO2: 100% 100% 100% 100%  Weight:  63.9 kg    Height:        Intake/Output Summary (Last 24 hours) at 06/16/2021 1634 Last data filed at 06/16/2021 1100 Gross per 24 hour  Intake 120 ml  Output 0 ml  Net 120 ml   Filed Weights   06/13/21 1922 06/15/21 1510 06/16/21 0415  Weight: 64.9 kg 62.4 kg 63.9 kg    Data Reviewed: I have personally reviewed and interpreted daily labs, tele strips, imagings as discussed above. I reviewed all nursing notes, pharmacy notes, vitals, pertinent old records I have discussed plan of care as described above with RN and patient/family.  CBC: Recent Labs  Lab 06/13/21 1932 06/14/21 1100 06/15/21 0700 06/16/21 0554  WBC 10.4 13.5* 11.3* 13.6*  HGB 11.9* 12.4 12.8 12.0  HCT 34.7* 34.8* 37.6 34.8*  MCV 89.9 87.9 90.6 90.9  PLT 558* 591* 627* 632*   Basic Metabolic Panel: Recent Labs  Lab 06/13/21 1932 06/14/21 1100 06/15/21 0700 06/15/21 1337 06/16/21 0554  NA 130*  --  133*  --  133*  K 3.9  --  3.1*  --  4.3  CL 93*  --  98  --  100  CO2 29  --  28  --  26  GLUCOSE 550*  --  370*  --  312*  BUN 5*  --  9  --  13  CREATININE 0.59 0.46 0.47  --  0.57  CALCIUM 8.5*  --  7.6*  --  8.0*  MG  --   --   --  1.2* 1.8  PHOS  --   --   --  2.5 2.9    Studies: No results found.  Scheduled Meds:  atorvastatin  40 mg Oral q1800   carvedilol  6.25 mg Oral BID WC   enoxaparin (LOVENOX) injection  40 mg Subcutaneous Q24H   furosemide  40 mg Intravenous Daily   insulin aspart  0-15 Units Subcutaneous TID  WC   insulin glargine-yfgn  40 Units Subcutaneous Daily   magnesium oxide  400 mg Oral BID   sacubitril-valsartan  1 tablet Oral BID   spironolactone  25 mg Oral Daily   Vitamin D (Ergocalciferol)  50,000 Units Oral Q7 days   Continuous Infusions: PRN Meds: acetaminophen **OR** acetaminophen, ondansetron **OR** ondansetron (ZOFRAN) IV  Time spent: 35 minutes  Author: Gillis Santa. MD [facility] 06/16/2021 4:34 PM  To reach On-call, see care teams to locate the attending and reach out to them via [email or web address]. If 7PM-7AM, please contact night-coverage If you still have difficulty reaching the attending provider, please page the [facility] (Director on Call) for [facility] on amion for assistance.

## 2021-06-16 NOTE — TOC CM/SW Note (Signed)
Sent a secure email to [employer] with Financial Counseling to ask her to verify Medicaid tomorrow when back in office if possible.  Alfonso Ramus, [place] [phone]

## 2021-06-17 LAB — ECHOCARDIOGRAM COMPLETE
AR max vel: 1.71 cm2
AV Peak grad: 6.8 mmHg
Ao pk vel: 1.3 m/s
Area-P 1/2: 6.27 cm2
Calc EF: 27 %
S' Lateral: 4.8 cm
Single Plane A2C EF: 29.4 %
Single Plane A4C EF: 26.4 %

## 2021-06-17 LAB — LEGIONELLA PNEUMOPHILA SEROGP 1 UR AG: L. pneumophila Serogp 1 Ur Ag: NEGATIVE

## 2021-06-18 LAB — FUNGITELL, SERUM: Fungitell Result: <31 pg/mL (ref <80)

## 2021-06-21 LAB — ASPERGILLUS ANTIGEN, BAL/SERUM: Aspergillus Ag, BAL/Serum: 0.31 Index (ref 0.00–0.49)

## 2021-06-26 NOTE — Progress Notes (Deleted)
   Patient ID: Cheryl Mercer, female    DOB: 10/07/1985, 35 y.o.   MRN: [id number]  HPI  Cheryl Mercer is a 35 y/o female with a history of  Echo report from 06/15/21 reviewed and showed an EF of 30-35% along with moderate LAE and mild MR.   RHC/LHC done 03/25/2019 & showed: Ao = 122/88 (104) LV = 125/15 RA = 2 RV = 35/6 PA = 37/14 (25) PCW = 18 Fick cardiac output/index = 5.3/3.2 SVR = 1543 PVR = 1.3 WU Ao sat = 99% PA sat = 74%, 76% Assessment: 1. Normal coronary arteries 2. 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 Chest CT angiogram negative for PE. Cardiology and pulmonology consults obtained. Initially given IV lasix with transition to oral diuretics. HTN meds resumed. Discharged after 2 days. Was in the ED 06/03/21 due to pneumonia where she was treated and released.   She presents today for her initial visit with a chief complaint of   Review of Systems    Physical Exam    Assessment & Plan:  1: Chronic heart failure with reduced ejection fraction- - NYHA class - saw ADHFC 07/24/20; has not followed up since then - BNP 06/13/21 was 953.8  2: HTN- - BP - BMP 06/16/21 reviewed and showed sodium 133, potassium 4.3, creatinine 0.57 & GFR >60  3: DM- - A1c 06/14/21 was 13.7

## 2021-06-27 ENCOUNTER — Ambulatory Visit: Payer: Medicaid Other | Admitting: Family

## 2021-06-28 ENCOUNTER — Ambulatory Visit: Payer: Medicaid Other | Admitting: Family

## 2021-06-28 ENCOUNTER — Telehealth: Payer: Self-pay | Admitting: Family

## 2021-06-28 NOTE — Telephone Encounter (Signed)
Patient did not show for her Heart Failure Clinic appointment on 06/28/21 even after confirming with the patient. Will attempt to reschedule.

## 2021-07-01 ENCOUNTER — Other Ambulatory Visit (HOSPITAL_COMMUNITY): Payer: Self-pay

## 2021-07-15 ENCOUNTER — Encounter (HOSPITAL_COMMUNITY): Payer: Self-pay | Admitting: Radiology

## 2021-08-27 ENCOUNTER — Encounter (HOSPITAL_COMMUNITY): Payer: Self-pay

## 2021-08-27 ENCOUNTER — Emergency Department (HOSPITAL_COMMUNITY): Payer: Medicaid Other

## 2021-08-27 ENCOUNTER — Emergency Department (HOSPITAL_COMMUNITY)
Admission: EM | Admit: 2021-08-27 | Discharge: 2021-08-28 | Disposition: A | Discharge status: Home / Self Care | Payer: Medicaid Other | Attending: Physician Assistant | Admitting: Physician Assistant

## 2021-08-27 DIAGNOSIS — R9431 Abnormal electrocardiogram [ECG] [EKG]: Secondary | ICD-10-CM | POA: Diagnosis not present

## 2021-08-27 DIAGNOSIS — Z5321 Procedure and treatment not carried out due to patient leaving prior to being seen by health care provider: Secondary | ICD-10-CM | POA: Diagnosis not present

## 2021-08-27 DIAGNOSIS — M7989 Other specified soft tissue disorders: Secondary | ICD-10-CM | POA: Insufficient documentation

## 2021-08-27 DIAGNOSIS — I5022 Chronic systolic (congestive) heart failure: Secondary | ICD-10-CM | POA: Diagnosis not present

## 2021-08-27 DIAGNOSIS — I509 Heart failure, unspecified: Secondary | ICD-10-CM | POA: Diagnosis not present

## 2021-08-27 DIAGNOSIS — Z20822 Contact with and (suspected) exposure to covid-19: Secondary | ICD-10-CM | POA: Diagnosis not present

## 2021-08-27 DIAGNOSIS — I517 Cardiomegaly: Secondary | ICD-10-CM | POA: Diagnosis not present

## 2021-08-27 DIAGNOSIS — R2243 Localized swelling, mass and lump, lower limb, bilateral: Secondary | ICD-10-CM | POA: Diagnosis not present

## 2021-08-27 DIAGNOSIS — J811 Chronic pulmonary edema: Secondary | ICD-10-CM | POA: Diagnosis not present

## 2021-08-27 DIAGNOSIS — E119 Type 2 diabetes mellitus without complications: Secondary | ICD-10-CM | POA: Insufficient documentation

## 2021-08-27 DIAGNOSIS — R0602 Shortness of breath: Secondary | ICD-10-CM | POA: Diagnosis not present

## 2021-08-27 LAB — COMPREHENSIVE METABOLIC PANEL
ALT: 11 U/L (ref 0–44)
AST: 27 U/L (ref 15–41)
Albumin: 2.3 g/dL — ABNORMAL LOW (ref 3.5–5.0)
Alkaline Phosphatase: 141 U/L — ABNORMAL HIGH (ref 38–126)
Anion gap: 12 (ref 5–15)
BUN: 6 mg/dL (ref 6–20)
CO2: 28 mmol/L (ref 22–32)
Calcium: 8.1 mg/dL — ABNORMAL LOW (ref 8.9–10.3)
Chloride: 89 mmol/L — ABNORMAL LOW (ref 98–111)
Creatinine, Ser: 0.78 mg/dL (ref 0.44–1.00)
GFR, Estimated: >60 mL/min (ref >60)
Glucose, Bld: 720 mg/dL (ref 70–99)
Potassium: 3.9 mmol/L (ref 3.5–5.1)
Sodium: 129 mmol/L — ABNORMAL LOW (ref 135–145)
Total Bilirubin: 0.7 mg/dL (ref 0.3–1.2)
Total Protein: 7.1 g/dL (ref 6.5–8.1)

## 2021-08-27 LAB — CBC WITH DIFFERENTIAL/PLATELET
Abs Immature Granulocytes: 0.06 10*3/uL (ref 0.00–0.07)
Basophils Absolute: 0.1 10*3/uL (ref 0.0–0.1)
Basophils Relative: 1 %
Eosinophils Absolute: 0.2 10*3/uL (ref 0.0–0.5)
Eosinophils Relative: 2 %
HCT: 40.4 % (ref 36.0–46.0)
Hemoglobin: 13.8 g/dL (ref 12.0–15.0)
Immature Granulocytes: 1 %
Lymphocytes Relative: 18 %
Lymphs Abs: 1.8 10*3/uL (ref 0.7–4.0)
MCH: 30 pg (ref 26.0–34.0)
MCHC: 34.2 g/dL (ref 30.0–36.0)
MCV: 87.8 fL (ref 80.0–100.0)
Monocytes Absolute: 0.4 10*3/uL (ref 0.1–1.0)
Monocytes Relative: 4 %
Neutro Abs: 7.1 10*3/uL (ref 1.7–7.7)
Neutrophils Relative %: 74 %
Platelets: 349 10*3/uL (ref 150–400)
RBC: 4.6 MIL/uL (ref 3.87–5.11)
RDW: 13.7 % (ref 11.5–15.5)
WBC: 9.5 10*3/uL (ref 4.0–10.5)
nRBC: 0 % (ref 0.0–0.2)

## 2021-08-27 LAB — I-STAT BETA HCG BLOOD, ED (MC, WL, AP ONLY): I-stat hCG, quantitative: <5 m[IU]/mL (ref <5)

## 2021-08-27 LAB — TROPONIN I (HIGH SENSITIVITY): Troponin I (High Sensitivity): 19 ng/L — ABNORMAL HIGH (ref <18)

## 2021-08-27 LAB — BRAIN NATRIURETIC PEPTIDE: B Natriuretic Peptide: 1430.1 pg/mL — ABNORMAL HIGH (ref 0.0–100.0)

## 2021-08-27 LAB — RESP PANEL BY RT-PCR (FLU A&B, COVID) ARPGX2
Influenza A by PCR: NEGATIVE
Influenza B by PCR: NEGATIVE
SARS Coronavirus 2 by RT PCR: NEGATIVE

## 2021-08-27 NOTE — ED Triage Notes (Signed)
Pt arrives POV for eval of blt leg swelling 3+ edema. Pt reports her legs feel cold to touch. Pt states hx of CHF, compliant w/ meds but was out for 2 weeks just before christmas d/t insurance issue. States cardiac meds "just arent helping like they should". Denies CP, or worsening SOB

## 2021-08-27 NOTE — ED Provider Triage Note (Signed)
Emergency Medicine Provider Triage Evaluation Note  Cheryl Mercer , a 36 y.o. female  was evaluated in triage.  Pt complains of leg swelling shortness of breath.  Patient has a medical history including chronic systolic heart failure with last known LVEF of 25% to 30%, insulin-dependent diabetes mellitus.  Patient states that for about 2 weeks in December she was having issues with her insurance and was noncompliant with her medications.  She states that she restarted them just before Christmas.  She states that prior to restarting her medication she began experiencing leg swelling as well as mild shortness of breath that has persisted.  Denies any chest pain, abdominal pain, nausea, vomiting.  She notes a mild cough and also notes sick relatives in her home.  Physical Exam  BP (!) 158/122 (BP Location: Left Arm)    Pulse (!) 119    Temp 97.8 F (36.6 C) (Oral)    Resp (!) 24    Ht 4\' 9"  (1.448 m)    Wt 64 kg    SpO2 100%    BMI 30.53 kg/m  Gen:   Awake, no distress   Resp:  Normal effort  MSK:   Moves extremities without difficulty  Other:    Medical Decision Making  Medically screening exam initiated at 10:23 PM.  Appropriate orders placed.  Cheryl Mercer was informed that the remainder of the evaluation will be completed by another provider, this initial triage assessment does not replace that evaluation, and the importance of remaining in the ED until their evaluation is complete.   Valentina Shaggy, PA-C 08/27/21 2224

## 2021-08-27 NOTE — ED Notes (Signed)
Critical Glucose 720 reported to [facility]

## 2021-08-28 ENCOUNTER — Other Ambulatory Visit: Payer: Self-pay

## 2021-08-28 ENCOUNTER — Emergency Department
Admission: EM | Admit: 2021-08-28 | Discharge: 2021-08-28 | Disposition: A | Discharge status: Home / Self Care | Payer: Medicaid Other | Attending: Emergency Medicine | Admitting: Emergency Medicine

## 2021-08-28 ENCOUNTER — Encounter: Payer: Self-pay | Admitting: Emergency Medicine

## 2021-08-28 DIAGNOSIS — R600 Localized edema: Secondary | ICD-10-CM | POA: Diagnosis not present

## 2021-08-28 DIAGNOSIS — E1165 Type 2 diabetes mellitus with hyperglycemia: Secondary | ICD-10-CM | POA: Diagnosis not present

## 2021-08-28 DIAGNOSIS — I509 Heart failure, unspecified: Secondary | ICD-10-CM | POA: Diagnosis not present

## 2021-08-28 DIAGNOSIS — R739 Hyperglycemia, unspecified: Secondary | ICD-10-CM | POA: Insufficient documentation

## 2021-08-28 DIAGNOSIS — M7989 Other specified soft tissue disorders: Secondary | ICD-10-CM | POA: Diagnosis present

## 2021-08-28 DIAGNOSIS — R609 Edema, unspecified: Secondary | ICD-10-CM

## 2021-08-28 LAB — BASIC METABOLIC PANEL
Anion gap: 8 (ref 5–15)
BUN: 7 mg/dL (ref 6–20)
CO2: 31 mmol/L (ref 22–32)
Calcium: 8.2 mg/dL — ABNORMAL LOW (ref 8.9–10.3)
Chloride: 92 mmol/L — ABNORMAL LOW (ref 98–111)
Creatinine, Ser: 0.7 mg/dL (ref 0.44–1.00)
GFR, Estimated: >60 mL/min (ref >60)
Glucose, Bld: 509 mg/dL (ref 70–99)
Potassium: 3.2 mmol/L — ABNORMAL LOW (ref 3.5–5.1)
Sodium: 131 mmol/L — ABNORMAL LOW (ref 135–145)

## 2021-08-28 LAB — TROPONIN I (HIGH SENSITIVITY): Troponin I (High Sensitivity): 14 ng/L (ref <18)

## 2021-08-28 LAB — BRAIN NATRIURETIC PEPTIDE: B Natriuretic Peptide: 1041.5 pg/mL — ABNORMAL HIGH (ref 0.0–100.0)

## 2021-08-28 MED ORDER — FUROSEMIDE 10 MG/ML IJ SOLN
80.0000 mg | Freq: Once | INTRAMUSCULAR | Status: AC
  Administered 2021-08-28: 80 mg via INTRAVENOUS
  Filled 2021-08-28: qty 8

## 2021-08-28 NOTE — Discharge Instructions (Addendum)
Please be sure to take your Lasix

## 2021-08-28 NOTE — Patient Instructions (Signed)
Cheryl Mercer ,   The [facility] Managed Care Team is available to provide assistance to you with your healthcare needs at no cost and as a benefit of your [facility] plan. I'm sorry I was unable to reach you today for our scheduled appointment. Our care guide will call you to reschedule our telephone appointment. Please call me at the number below. I am available to be of assistance to you regarding your healthcare needs. .   Thank you,   Virgina Norfolk RN, BSN [facility] Coordinator [facility]   [facility] Mobile: [phone]

## 2021-08-28 NOTE — ED Triage Notes (Signed)
Pt to ED via POV with c/o leg swelling, she has had this for about 2 weeks she went to [facility] on 08/27/21 for the same but left because wait was 15 hours. She she denies any SHOB. She last took her medications for CHF on 08/26/21 Both legs are swollen. 3+ edema.

## 2021-08-28 NOTE — ED Provider Notes (Signed)
[facility] Provider Note    Event Date/Time   First MD Initiated Contact with Patient 08/28/21 1134     (approximate)   History   Leg Swelling   HPI  Cheryl Mercer is a 36 y.o. female who presents with complaints of lower extremity swelling.  Patient reports that she went to [facility] yesterday but left because of long wait times.  She notes a history of CHF and reports intermittent compliance with her medications.  She states that she has not checked her sugar today but does have insulin at home.  She complains primarily of lower extremity swelling, no shortness of breath reported no chest pain     Physical Exam   Triage Vital Signs: ED Triage Vitals  Enc Vitals Group     BP 08/28/21 1120 (!) 140/107     Pulse Rate 08/28/21 1120 (!) 106     Resp 08/28/21 1120 (!) 24     Temp 08/28/21 1120 97.7 F (36.5 C)     Temp Source 08/28/21 1120 Oral     SpO2 08/28/21 1120 99 %     Weight 08/28/21 1123 65.8 kg (145 lb)     Height 08/28/21 1123 1.448 m (4\' 9" )     Head Circumference --      Peak Flow --      Pain Score 08/28/21 1122 8     Pain Loc --      Pain Edu? --      Excl. in GC? --     Most recent vital signs: Vitals:   08/28/21 1120  BP: (!) 140/107  Pulse: (!) 106  Resp: (!) 24  Temp: 97.7 F (36.5 C)  SpO2: 99%     General: Awake, no distress.  CV:  Good peripheral perfusion.  Resp:  Normal effort.  Clear to auscultation bilaterally Abd:  No distention.  Other:  1+ edema bilaterally warm and well-perfused   ED Results / Procedures / Treatments   Labs (all labs ordered are listed, but only abnormal results are displayed) Labs Reviewed  BASIC METABOLIC PANEL - Abnormal; Notable for the following components:      Result Value   Sodium 131 (*)    Potassium 3.2 (*)    Chloride 92 (*)    Glucose, Bld 509 (*)    Calcium 8.2 (*)    All other components within normal limits  BRAIN NATRIURETIC PEPTIDE -  Abnormal; Notable for the following components:   B Natriuretic Peptide 1,041.5 (*)    All other components within normal limits  TROPONIN I (HIGH SENSITIVITY)     EKG  ED ECG REPORT I, 10/26/21, the attending physician, personally viewed and interpreted this ECG.  Date: 08/28/2021  Rhythm: normal sinus rhythm QRS Axis: normal Intervals: normal ST/T Wave abnormalities: normal Narrative Interpretation: no evidence of acute ischemia    RADIOLOGY Review chest x-ray from yesterday, no overt pulmonary edema    PROCEDURES:  Critical Care performed:   Procedures   MEDICATIONS ORDERED IN ED: Medications  furosemide (LASIX) injection 80 mg (80 mg Intravenous Given 08/28/21 1205)     IMPRESSION / MDM / ASSESSMENT AND PLAN / ED COURSE  I reviewed the triage vital signs and the nursing notes.   Patient with a history of CHF presents with lower extremity edema which is making it uncomfortable for her to walk.  Reviewed lab work at outside emergency department yesterday, elevated glucose of 720 and elevated  BNP of 1400 as well as a troponin of 19  Have convinced her to repeat labs today  , Her glucose has improved to 509, anion gap is normal.  BNP has improved to 1000, troponin is 14,  Patient given IV Lasix 60 mg  Have arranged for follow-up in the CHF clinic in 24 hours for repeat evaluation/Lasix dosing  Strongly encouraged patient to use her insulin as prescribed to control her sugars, she has a history of noncompliance unfortunately  Considered admission however patient can be adequately treated in the CHF clinic         FINAL CLINICAL IMPRESSION(S) / ED DIAGNOSES   Final diagnoses:  Acute on chronic congestive heart failure, unspecified heart failure type (HCC)  Peripheral edema  Hyperglycemia     Rx / DC Orders   ED Discharge Orders     None        Note:  This document was prepared using Dragon voice recognition software and may  include unintentional dictation errors.   Jene Every, MD 08/28/21 1531

## 2021-08-28 NOTE — Patient Outreach (Signed)
Care Coordination  08/28/2021  Cheryl Mercer [birth year] [id number]  08/28/2021 Name: Cheryl Mercer MRN: [id number] DOB: May 19, 1986  Referred by: Pcp, No Reason for referral : High Risk Managed Medicaid (Unsuccessful Telephone Outreach)   An unsuccessful telephone outreach was attempted today. The patient was referred to the case management team for assistance with care management and care coordination.   Patient's phone would not allow a voicemail message to be left.  Follow Up Plan: The Managed Medicaid care management team will reach out to the patient again over the next 7 -14  days.    Virgina Norfolk RN, BSN Community Care Coordinator [facility]   [facility] Mobile: [phone]

## 2021-08-28 NOTE — ED Triage Notes (Signed)
Pt does not want to repeat blood work, EKG or chest xray until she sees a doctor, she got all this done yesterday at [facility]

## 2021-08-28 NOTE — Progress Notes (Signed)
Inpatient Diabetes Program Recommendations  AACE/ADA: New Consensus Statement on Inpatient Glycemic Control  Target Ranges:  Prepandial:   less than 140 mg/dL      Peak postprandial:   less than 180 mg/dL (1-2 hours)      Critically ill patients:  140 - 180 mg/dL    Latest Reference Range & Units 08/27/21 22:31  CO2 22 - 32 mmol/L 28  Glucose 70 - 99 mg/dL 720 (HH)  Anion gap 5 - 15  12    Latest Reference Range & Units 06/14/21 11:00  Hemoglobin A1C 4.8 - 5.6 % 13.7 (H)   Review of Glycemic Control  Diabetes history: DM2 Outpatient DM medications: Lantus 40 units daily, Novolog 2-15 units TID with meals, Metformin 500 mg BID Current orders for Inpatient glycemic control: None; in ED  NOTE: Per chart, patient currently in ED46. Per notes, patient went to [facility] ED on 08/27/20 and per ED triage note on 08/27/21@10 :21 pm, "Pt arrives POV for eval of blt leg swelling 3+ edema. Pt reports her legs feel cold to touch. Pt states hx of CHF, compliant w/ meds but was out for 2 weeks just before christmas d/t insurance issue. States cardiac meds "just arent helping like they should". Denies CP, or worsening SOB."  Noted lab glucose 720 mg/dl on 08/27/21 at 22:31. Per ED triage note today at [facility] ED, "Pt to ED via POV with c/o leg swelling, she has had this for about 2 weeks she went to [facility] on 08/27/21 for the same but left because wait was 15 hours."  Also noted ED triage note 08/28/21@11 :25, "Pt does not want to repeat blood work, EKG or chest xray until she sees a doctor, she got all this done yesterday at [facility]  Sent communication to K. Efraim Kaufmann, RN and she reports that patient is now in ED room so labs should be repeated.  Thanks, Barnie Alderman, RN, MSN, CDE Diabetes Coordinator Inpatient Diabetes Program [phone] (Team Pager from 8am to 5pm)

## 2021-08-29 ENCOUNTER — Telehealth: Payer: Self-pay

## 2021-08-29 NOTE — Telephone Encounter (Signed)
Transition Care Management Unsuccessful Follow-up Telephone Call ° °Date of discharge and from where:  08/28/2021 from [facility] ° °Attempts:  1st Attempt ° °Reason for unsuccessful TCM follow-up call:  Unable to leave message ° ° ° °

## 2021-08-30 ENCOUNTER — Encounter: Payer: Self-pay | Admitting: Family

## 2021-08-30 ENCOUNTER — Ambulatory Visit (HOSPITAL_BASED_OUTPATIENT_CLINIC_OR_DEPARTMENT_OTHER): Payer: Medicaid Other | Admitting: Family

## 2021-08-30 ENCOUNTER — Other Ambulatory Visit: Payer: Self-pay | Admitting: Family

## 2021-08-30 ENCOUNTER — Ambulatory Visit
Admission: RE | Admit: 2021-08-30 | Discharge: 2021-08-30 | Disposition: A | Discharge status: Home / Self Care | Payer: Medicaid Other | Source: Ambulatory Visit | Attending: Family | Admitting: Family

## 2021-08-30 ENCOUNTER — Other Ambulatory Visit: Payer: Self-pay

## 2021-08-30 VITALS — BP 145/110 | HR 110 | Resp 20 | Ht 59.0 in | Wt 136.1 lb

## 2021-08-30 DIAGNOSIS — I428 Other cardiomyopathies: Secondary | ICD-10-CM | POA: Diagnosis not present

## 2021-08-30 DIAGNOSIS — I5023 Acute on chronic systolic (congestive) heart failure: Secondary | ICD-10-CM | POA: Insufficient documentation

## 2021-08-30 DIAGNOSIS — I10 Essential (primary) hypertension: Secondary | ICD-10-CM | POA: Diagnosis not present

## 2021-08-30 DIAGNOSIS — Z79899 Other long term (current) drug therapy: Secondary | ICD-10-CM | POA: Insufficient documentation

## 2021-08-30 DIAGNOSIS — M549 Dorsalgia, unspecified: Secondary | ICD-10-CM | POA: Insufficient documentation

## 2021-08-30 DIAGNOSIS — E1165 Type 2 diabetes mellitus with hyperglycemia: Secondary | ICD-10-CM | POA: Insufficient documentation

## 2021-08-30 DIAGNOSIS — G43909 Migraine, unspecified, not intractable, without status migrainosus: Secondary | ICD-10-CM | POA: Diagnosis not present

## 2021-08-30 DIAGNOSIS — Z86711 Personal history of pulmonary embolism: Secondary | ICD-10-CM | POA: Insufficient documentation

## 2021-08-30 DIAGNOSIS — G8929 Other chronic pain: Secondary | ICD-10-CM | POA: Insufficient documentation

## 2021-08-30 DIAGNOSIS — I890 Lymphedema, not elsewhere classified: Secondary | ICD-10-CM

## 2021-08-30 DIAGNOSIS — E1140 Type 2 diabetes mellitus with diabetic neuropathy, unspecified: Secondary | ICD-10-CM | POA: Diagnosis not present

## 2021-08-30 DIAGNOSIS — I110 Hypertensive heart disease with heart failure: Secondary | ICD-10-CM | POA: Insufficient documentation

## 2021-08-30 DIAGNOSIS — Z794 Long term (current) use of insulin: Secondary | ICD-10-CM | POA: Insufficient documentation

## 2021-08-30 LAB — BASIC METABOLIC PANEL
Anion gap: 10 (ref 5–15)
BUN: 6 mg/dL (ref 6–20)
CO2: 29 mmol/L (ref 22–32)
Calcium: 8.7 mg/dL — ABNORMAL LOW (ref 8.9–10.3)
Chloride: 94 mmol/L — ABNORMAL LOW (ref 98–111)
Creatinine, Ser: 0.64 mg/dL (ref 0.44–1.00)
GFR, Estimated: >60 mL/min (ref >60)
Glucose, Bld: 432 mg/dL — ABNORMAL HIGH (ref 70–99)
Potassium: 3.7 mmol/L (ref 3.5–5.1)
Sodium: 133 mmol/L — ABNORMAL LOW (ref 135–145)

## 2021-08-30 LAB — BRAIN NATRIURETIC PEPTIDE: B Natriuretic Peptide: 746.7 pg/mL — ABNORMAL HIGH (ref 0.0–100.0)

## 2021-08-30 LAB — GLUCOSE, CAPILLARY: Glucose-Capillary: 398 mg/dL — ABNORMAL HIGH (ref 70–99)

## 2021-08-30 MED ORDER — FUROSEMIDE 10 MG/ML IJ SOLN: 80.0000 mg | Freq: Once | INTRAMUSCULAR | Status: AC

## 2021-08-30 MED ORDER — BLOOD GLUCOSE METER KIT: PACK | 0 refills | Status: DC

## 2021-08-30 MED ORDER — POTASSIUM CHLORIDE CRYS ER 20 MEQ PO TBCR: 40.0000 meq | EXTENDED_RELEASE_TABLET | Freq: Once | ORAL | Status: AC

## 2021-08-30 MED ORDER — POTASSIUM CHLORIDE CRYS ER 20 MEQ PO TBCR
EXTENDED_RELEASE_TABLET | ORAL | Status: AC
  Administered 2021-08-30: 40 meq via ORAL
  Filled 2021-08-30: qty 2

## 2021-08-30 MED ORDER — FUROSEMIDE 10 MG/ML IJ SOLN
INTRAMUSCULAR | Status: AC
  Administered 2021-08-30: 80 mg via INTRAVENOUS
  Filled 2021-08-30: qty 8

## 2021-08-30 NOTE — Progress Notes (Signed)
Patient ID: Cheryl Mercer, female    DOB: [birth year], 36 y.o.   MRN: [id number]  HPI  Ms Kopf is a 36 y/o female with a history of DM, HTN, chronic back pain, migraines, PE and chronic heart failure.   Echo report from 06/15/21 reviewed and showed an EF of 30-35% along with moderate LAE, mild LVH and mild MR.   RHC/LHC done 03/25/2019 and showed: Ao = 122/88 (104) LV = 125/15 RA = 2 RV = 35/6 PA = 37/14 (25) PCW = 18 Fick cardiac output/index = 5.3/3.2 SVR = 1543 PVR = 1.3 WU Ao sat = 99% PA sat = 74%, 76%  Assessment: 1. Normal coronary arteries 2. 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 Well-compensated hemodynamics  Was in the ED 08/28/21 due to leg swelling due to acute on chronic HF.Given IV lasix. Noted to be hyperglycemic with initial glucose of 720 with repeat of 509.She was released.   She presents today for her initial visit with a chief complaint of moderate fatigue upon minimal exertion. She describes this as chronic in nature having been present for several years. She has associated cough, pedal edema, palpitations, abdominal distention, dizziness, neuropathy and difficulty sleeping due to orthopnea. She denies any chest pain or cough.   Is not weighing herself because she doesn't have any scales. Hasn't been checking her glucose because she needs a meter/ supplies nor taking her insulin because she doesn't have any needles.   Drinking between 60-80 ounces of fluid daily along with eating ice. Although says that some days, she doesn't drink hardly anything. Lost ~ 100 pounds over the last year due to close monitoring of her portion sizes.   Past Medical History:  Diagnosis Date   CHF (congestive heart failure) (HCC)    Chronic back pain    Chronic combined systolic and diastolic congestive heart failure ([place]) 09/07/2019   Community acquired pneumonia 12/16/2019   Diabetes mellitus without complication ([place])    History of COVID-19 11/07/2019   Migraines    No pertinent  past medical history    Pregnancy induced hypertension    Pulmonary embolus ([place]) 12/16/2019   Pulmonary infarct ([place]) 12/17/2019   Past Surgical History:  Procedure Laterality Date   CERVICAL BIOPSY  W/ LOOP ELECTRODE EXCISION  2009   Laparoscopic tubal sterilization with Falope rings.  2009   RIGHT/LEFT HEART CATH AND CORONARY ANGIOGRAPHY N/A 03/25/2019   Procedure: RIGHT/LEFT HEART CATH AND CORONARY ANGIOGRAPHY;  Surgeon: Jolaine Artist, MD;  Location: [place] [facility];  Service: Cardiovascular;  Laterality: N/A;   TUBAL LIGATION     Family History  Problem Relation Age of Onset   Hypertension Mother    Anesthesia problems Neg Hx    Social History   Tobacco Use   Smoking status: Never   Smokeless tobacco: Never  Substance Use Topics   Alcohol use: No    Alcohol/week: 6.0 standard drinks    Types: 6 Shots of liquor per week   Allergies  Allergen Reactions   Ibuprofen Anaphylaxis   Prior to Admission medications   Medication Sig Start Date End Date Taking? Authorizing Provider  atorvastatin (LIPITOR) 40 MG tablet Take 1 tablet (40 mg total) by mouth daily at 6 PM. 06/15/21  Yes Val Riles, MD  carvedilol (COREG) 6.25 MG tablet Take 1 tablet (6.25 mg total) by mouth 2 (two) times daily with a meal. 06/15/21  Yes Val Riles, MD  furosemide (LASIX) 40 MG tablet Take 1 tablet (40  mg total) by mouth daily. 06/15/21  Yes Val Riles, MD  insulin glargine (LANTUS) 100 UNIT/ML Solostar Pen Inject 40 Units into the skin daily. 06/15/21  Yes Val Riles, MD  metFORMIN (GLUCOPHAGE) 500 MG tablet Take 1 tablet (500 mg total) by mouth 2 (two) times daily with a meal. 06/15/21  Yes Val Riles, MD  potassium chloride SA (KLOR-CON) 20 MEQ tablet Take 1 tablet (20 mEq total) by mouth daily. 06/15/21  Yes Val Riles, MD  sacubitril-valsartan (ENTRESTO) 24-26 MG Take 1 tablet by mouth 2 (two) times daily. 06/15/21  Yes Val Riles, MD  spironolactone (ALDACTONE) 25 MG  tablet Take 1 tablet (25 mg total) by mouth daily. 06/15/21  Yes Val Riles, MD  Vitamin D, Ergocalciferol, (DRISDOL) 1.25 MG (50000 UNIT) CAPS capsule Take 1 capsule (50,000 Units total) by mouth every 7 (seven) days. 06/23/21 09/21/21 Yes Val Riles, MD  blood glucose meter kit and supplies Dispense based on patient and insurance preference. Check blood sugar 4 times daily, three times before meals and once before bedtime. (FOR ICD-10 E10.9, E11.9). 08/30/21   Alisa Graff, FNP  gabapentin (NEURONTIN) 300 MG capsule Take 1 capsule (300 mg total) by mouth 3 (three) times daily. Patient not taking: Reported on 08/30/2021 07/02/20   Isla Pence, MD  insulin aspart (NOVOLOG FLEXPEN) 100 UNIT/ML FlexPen Inject 2-15 Units into the skin 3 (three) times daily after meals. CBG 121 - 150: 2 units, 151 - 200: 3 units, 201 - 250: 5 units, 251 - 300: 8 units, 301 - 350: 11 units, 351 - 400: 15u 06/15/21 07/15/21  Val Riles, MD   Review of Systems  Constitutional:  Positive for fatigue. Negative for appetite change.  HENT:  Negative for congestion, postnasal drip and sore throat.   Eyes: Negative.   Respiratory:  Positive for shortness of breath. Negative for cough.   Cardiovascular:  Positive for palpitations and leg swelling. Negative for chest pain.  Gastrointestinal:  Positive for abdominal distention. Negative for abdominal pain.  Endocrine: Negative.   Genitourinary: Negative.   Musculoskeletal:  Positive for arthralgias (leg pain; has been out of neurontin). Negative for back pain.  Skin: Negative.   Allergic/Immunologic: Negative.   Neurological:  Positive for dizziness and numbness (& tingling in feet).  Hematological:  Negative for adenopathy. Does not bruise/bleed easily.  Psychiatric/Behavioral:  Positive for sleep disturbance (sleeping on 1.5-2 pillows due to SOB). Negative for dysphoric mood. The patient is not nervous/anxious.    Vitals:   08/30/21 1042  BP: (!) 145/110   Pulse: (!) 110  Resp: 20  SpO2: 100%  Weight: 136 lb 2 oz (61.7 kg)  Height: 4' 11"  (1.499 m)   Wt Readings from Last 3 Encounters:  08/30/21 136 lb 2 oz (61.7 kg)  08/28/21 145 lb (65.8 kg)  08/27/21 141 lb 1.5 oz (64 kg)   Lab Results  Component Value Date   CREATININE 0.70 08/28/2021   CREATININE 0.78 08/27/2021   CREATININE 0.57 06/16/2021    Physical Exam Vitals and nursing note reviewed. Exam conducted with a chaperone present (niece).  Constitutional:      Appearance: Normal appearance.  HENT:     Head: Normocephalic and atraumatic.  Cardiovascular:     Rate and Rhythm: Regular rhythm. Tachycardia present.  Pulmonary:     Effort: Pulmonary effort is normal. No respiratory distress.     Breath sounds: No wheezing or rales.  Abdominal:     General: There is distension.  Palpations: Abdomen is soft.  Musculoskeletal:        General: No tenderness.     Cervical back: Normal range of motion and neck supple.     Right lower leg: Edema (2+ pitting) present.     Left lower leg: Edema (2+ pitting) present.  Skin:    General: Skin is warm and dry.  Neurological:     General: No focal deficit present.     Mental Status: She is alert and oriented to person, place, and time.  Psychiatric:        Mood and Affect: Mood normal.        Behavior: Behavior normal.        Thought Content: Thought content normal.    Assessment & Plan:  1: Acute on Chronic heart failure with reduced ejection fraction- - NYHA class III - moderately fluid overloaded today with abdominal distention, pedal edema and shortness of breath - will send for 42m IV lasix/ 461m PO potassium today - check BMP/BNP today - scales provided and she was instructed to weigh every morning and call for an overnight weight gain of > 2 pounds or a weekly weight gain of >5 pounds - discussed that her daily fluid intake should be between 60-64 ounces of fluid daily and that she needs to account for her ice  eating in that intake - on GDMT of carvedilol, entresto and spironolactone - consider adding jardiance at next visit - tachycardic today but she says that she hasn't taken any of her medications yet today - BNP 08/28/21 was 1041.5  2: HTN- - BP elevated (145/110) but she hasn't taken any of her medications yet today - has had difficulty in getting PCP appointment so appt scheduled with [facility] for 10/25/21 - BMP 08/28/21 reviewed and showed sodium 131, potassium 3.2, creatinine 0.7 & GFR >60  3: DM- - not checking her glucose at home even though she's on insulin - fasting glucose check in the office today was 398 - RX for glucometer/ supplies sent to pharmacy; told her to check with pharmacy and ask them to fax me what type of needles she needs and I will order them until she gets in with PCP in March  4: Lymphedema-  - stage 2 - not elevating her legs much and she was encouraged to elevate them when sitting for long periods of time - has worn compression socks in the past but says that they hurt his legs but she thinks she was wearing them 24 hours/ day; explained that she should put them on every morning with removal at bedtime - limited in her ability to exercise due to symptoms - consider compression boots if edema persists   Patient did not bring her medications nor a list. Each medication was verbally reviewed with the patient and she was encouraged to bring the bottles to every visit to confirm accuracy of list.   Return in 3 days, sooner if needed

## 2021-08-30 NOTE — Telephone Encounter (Signed)
Transition Care Management Unsuccessful Follow-up Telephone Call  Date of discharge and from where:  08/28/2021-[facility]  Patient has completed follow up visit with Cardiologist

## 2021-08-30 NOTE — Progress Notes (Deleted)
°   Patient ID: Cheryl Mercer, female    DOB: 1986/01/17, 36 y.o.   MRN: [id number]  HPI    Review of Systems  Constitutional:  Positive for fatigue. Negative for appetite change.  Respiratory:  Positive for shortness of breath. Negative for cough.   Cardiovascular:  Positive for palpitations and leg swelling. Negative for chest pain.  Gastrointestinal:  Negative for abdominal distention and abdominal pain.  Musculoskeletal:  Positive for arthralgias (leg pain).  Neurological:  Positive for light-headedness (at times) and numbness.       Numbness/tingling in feet  Psychiatric/Behavioral:  Positive for sleep disturbance (sleeping on 1.5-2 pillows). The patient is not nervous/anxious.      Physical Exam

## 2021-08-30 NOTE — Patient Instructions (Addendum)
Start weighing daily and call for an overnight weight gain of 3 pounds or more or a weekly weight gain of more than 5 pounds.    Drink between 60-64 ounces of fluid daily.    Do not add any salt to your food.     Put compression socks on every morning with removal at bedtime

## 2021-09-02 ENCOUNTER — Ambulatory Visit: Payer: Medicaid Other | Admitting: Family

## 2021-09-02 ENCOUNTER — Telehealth: Payer: Self-pay | Admitting: Family

## 2021-09-02 NOTE — Telephone Encounter (Signed)
Patient did not show for her Heart Failure Clinic appointment on 09/02/21. Will attempt to reschedule.

## 2021-09-05 ENCOUNTER — Other Ambulatory Visit: Payer: Self-pay | Admitting: Family

## 2021-09-05 ENCOUNTER — Ambulatory Visit (HOSPITAL_BASED_OUTPATIENT_CLINIC_OR_DEPARTMENT_OTHER): Payer: Medicaid Other | Admitting: Family

## 2021-09-05 ENCOUNTER — Other Ambulatory Visit: Payer: Self-pay

## 2021-09-05 ENCOUNTER — Ambulatory Visit
Admission: RE | Admit: 2021-09-05 | Discharge: 2021-09-05 | Disposition: A | Discharge status: Home / Self Care | Payer: Medicaid Other | Source: Ambulatory Visit | Attending: Family | Admitting: Family

## 2021-09-05 ENCOUNTER — Ambulatory Visit: Payer: Medicaid Other | Admitting: Family

## 2021-09-05 ENCOUNTER — Encounter: Payer: Self-pay | Admitting: Family

## 2021-09-05 VITALS — BP 139/108 | HR 111 | Resp 18 | Ht 60.0 in | Wt 135.0 lb

## 2021-09-05 DIAGNOSIS — E1165 Type 2 diabetes mellitus with hyperglycemia: Secondary | ICD-10-CM | POA: Diagnosis not present

## 2021-09-05 DIAGNOSIS — I5023 Acute on chronic systolic (congestive) heart failure: Secondary | ICD-10-CM | POA: Insufficient documentation

## 2021-09-05 DIAGNOSIS — Z79899 Other long term (current) drug therapy: Secondary | ICD-10-CM | POA: Diagnosis not present

## 2021-09-05 DIAGNOSIS — I428 Other cardiomyopathies: Secondary | ICD-10-CM | POA: Insufficient documentation

## 2021-09-05 DIAGNOSIS — I890 Lymphedema, not elsewhere classified: Secondary | ICD-10-CM

## 2021-09-05 DIAGNOSIS — Z794 Long term (current) use of insulin: Secondary | ICD-10-CM | POA: Insufficient documentation

## 2021-09-05 DIAGNOSIS — G43909 Migraine, unspecified, not intractable, without status migrainosus: Secondary | ICD-10-CM | POA: Insufficient documentation

## 2021-09-05 DIAGNOSIS — M549 Dorsalgia, unspecified: Secondary | ICD-10-CM | POA: Insufficient documentation

## 2021-09-05 DIAGNOSIS — G8929 Other chronic pain: Secondary | ICD-10-CM | POA: Diagnosis not present

## 2021-09-05 DIAGNOSIS — I110 Hypertensive heart disease with heart failure: Secondary | ICD-10-CM | POA: Insufficient documentation

## 2021-09-05 DIAGNOSIS — Z86711 Personal history of pulmonary embolism: Secondary | ICD-10-CM | POA: Insufficient documentation

## 2021-09-05 DIAGNOSIS — Z8249 Family history of ischemic heart disease and other diseases of the circulatory system: Secondary | ICD-10-CM | POA: Insufficient documentation

## 2021-09-05 DIAGNOSIS — Z7984 Long term (current) use of oral hypoglycemic drugs: Secondary | ICD-10-CM | POA: Insufficient documentation

## 2021-09-05 DIAGNOSIS — Z01812 Encounter for preprocedural laboratory examination: Secondary | ICD-10-CM | POA: Diagnosis not present

## 2021-09-05 DIAGNOSIS — I10 Essential (primary) hypertension: Secondary | ICD-10-CM

## 2021-09-05 DIAGNOSIS — E119 Type 2 diabetes mellitus without complications: Secondary | ICD-10-CM | POA: Insufficient documentation

## 2021-09-05 DIAGNOSIS — Z8616 Personal history of COVID-19: Secondary | ICD-10-CM | POA: Insufficient documentation

## 2021-09-05 DIAGNOSIS — I5043 Acute on chronic combined systolic (congestive) and diastolic (congestive) heart failure: Secondary | ICD-10-CM | POA: Insufficient documentation

## 2021-09-05 LAB — BRAIN NATRIURETIC PEPTIDE: B Natriuretic Peptide: 1066.7 pg/mL — ABNORMAL HIGH (ref 0.0–100.0)

## 2021-09-05 LAB — GLUCOSE, CAPILLARY: Glucose-Capillary: 447 mg/dL — ABNORMAL HIGH (ref 70–99)

## 2021-09-05 LAB — BASIC METABOLIC PANEL
Anion gap: 9 (ref 5–15)
BUN: 13 mg/dL (ref 6–20)
CO2: 27 mmol/L (ref 22–32)
Calcium: 9.1 mg/dL (ref 8.9–10.3)
Chloride: 94 mmol/L — ABNORMAL LOW (ref 98–111)
Creatinine, Ser: 0.63 mg/dL (ref 0.44–1.00)
GFR, Estimated: >60 mL/min (ref >60)
Glucose, Bld: 426 mg/dL — ABNORMAL HIGH (ref 70–99)
Potassium: 4.1 mmol/L (ref 3.5–5.1)
Sodium: 130 mmol/L — ABNORMAL LOW (ref 135–145)

## 2021-09-05 MED ORDER — SODIUM CHLORIDE FLUSH 0.9 % IV SOLN
INTRAVENOUS | Status: AC
  Filled 2021-09-05: qty 10

## 2021-09-05 MED ORDER — FUROSEMIDE 10 MG/ML IJ SOLN
INTRAMUSCULAR | Status: AC
  Filled 2021-09-05: qty 8

## 2021-09-05 MED ORDER — FUROSEMIDE 10 MG/ML IJ SOLN
80.0000 mg | Freq: Once | INTRAMUSCULAR | Status: AC
  Administered 2021-09-05: 80 mg via INTRAVENOUS

## 2021-09-05 MED ORDER — POTASSIUM CHLORIDE CRYS ER 20 MEQ PO TBCR
EXTENDED_RELEASE_TABLET | ORAL | Status: AC
  Filled 2021-09-05: qty 2

## 2021-09-05 MED ORDER — EMPAGLIFLOZIN 10 MG PO TABS: 10.0000 mg | ORAL_TABLET | Freq: Every day | ORAL | 5 refills | Status: DC

## 2021-09-05 MED ORDER — POTASSIUM CHLORIDE CRYS ER 20 MEQ PO TBCR
40.0000 meq | EXTENDED_RELEASE_TABLET | Freq: Once | ORAL | Status: AC
  Administered 2021-09-05: 40 meq via ORAL

## 2021-09-05 NOTE — Progress Notes (Signed)
° Patient ID: Cheryl Mercer, female    DOB: 03/07/1986, 35 y.o.   MRN: [id number] ° °HPI ° °Cheryl Mercer is a 35 y/o female with a history of DM, HTN, chronic back pain, migraines, PE and chronic heart failure.  ° °Echo report from 06/15/21 reviewed and showed an EF of 30-35% along with moderate LAE, mild LVH and mild MR.  ° °RHC/LHC done 03/25/2019 and showed: °Ao = 122/88 (104) °LV = 125/15 °RA = 2 °RV = 35/6 °PA = 37/14 (25) °PCW = 18 °Fick cardiac output/index = 5.3/3.2 °SVR = 1543 °PVR = 1.3 WU °Ao sat = 99% °PA sat = 74%, 76%  °Assessment: °1. Normal coronary arteries °2. Severe NICM EF 15% °3. Well-compensated hemodynamics ° °Was in the ED 08/28/21 due to leg swelling due to acute on chronic HF.Given IV lasix. Noted to be hyperglycemic with initial glucose of 720 with repeat of 509.She was released.  ° °She presents today for a follow-up visit with a chief complaint of moderate fatigue upon minimal exertion. She describes this as chronic in nature. She has associated shortness of breath, pedal edema, palpitations, dizziness, numbness in feet and difficulty sleeping along with this. She denies any abdominal distention, chest pain, cough or fatigue.  ° °Received IV lasix last week and says that her swelling improved quite a bit but over the last few days, her edema is worsening.  ° °She is weighing herself. Has not taken any of her medications yet today (it's 1:45pm) as she says that she woke up late.  ° °Past Medical History:  °Diagnosis Date  ° CHF (congestive heart failure) (HCC)   ° Chronic back pain   ° Chronic combined systolic and diastolic congestive heart failure (HCC) 09/07/2019  ° Community acquired pneumonia 12/16/2019  ° Diabetes mellitus without complication (HCC)   ° History of COVID-19 11/07/2019  ° Migraines   ° No pertinent past medical history   ° Pregnancy induced hypertension   ° Pulmonary embolus (HCC) 12/16/2019  ° Pulmonary infarct (HCC) 12/17/2019  ° °Past Surgical History:  °Procedure  Laterality Date  ° CERVICAL BIOPSY  W/ LOOP ELECTRODE EXCISION  2009  ° Laparoscopic tubal sterilization with Falope rings.  2009  ° RIGHT/LEFT HEART CATH AND CORONARY ANGIOGRAPHY N/A 03/25/2019  ° Procedure: RIGHT/LEFT HEART CATH AND CORONARY ANGIOGRAPHY;  Surgeon: Bensimhon, Daniel R, MD;  Location: [facility];  Service: Cardiovascular;  Laterality: N/A;  ° TUBAL LIGATION    ° °Family History  °Problem Relation Age of Onset  ° Hypertension Mother   ° Anesthesia problems Neg Hx   ° °Social History  ° °Tobacco Use  ° Smoking status: Never  ° Smokeless tobacco: Never  °Substance Use Topics  ° Alcohol use: No  °  Alcohol/week: 6.0 standard drinks  °  Types: 6 Shots of liquor per week  ° °Allergies  °Allergen Reactions  ° Ibuprofen Anaphylaxis  ° °Prior to Admission medications   °Medication Sig Start Date End Date Taking? Authorizing Provider  °atorvastatin (LIPITOR) 40 MG tablet Take 1 tablet (40 mg total) by mouth daily at 6 PM. 06/15/21  Yes Kumar, Dileep, MD  °carvedilol (COREG) 6.25 MG tablet Take 1 tablet (6.25 mg total) by mouth 2 (two) times daily with a meal. 06/15/21  Yes Kumar, Dileep, MD  °furosemide (LASIX) 40 MG tablet Take 1 tablet (40 mg total) by mouth daily. 06/15/21  Yes Kumar, Dileep, MD  °insulin aspart (NOVOLOG FLEXPEN) 100 UNIT/ML FlexPen Inject 2-15 Units   Units into the skin 3 (three) times daily after meals. CBG 121 - 150: 2 units, 151 - 200: 3 units, 201 - 250: 5 units, 251 - 300: 8 units, 301 - 350: 11 units, 351 - 400: 15u 06/15/21  Yes Val Riles, MD  insulin glargine (LANTUS) 100 UNIT/ML Solostar Pen Inject 40 Units into the skin daily. 06/15/21  Yes Val Riles, MD  metFORMIN (GLUCOPHAGE) 500 MG tablet Take 1 tablet (500 mg total) by mouth 2 (two) times daily with a meal. 06/15/21  Yes Val Riles, MD  potassium chloride SA (KLOR-CON) 20 MEQ tablet Take 1 tablet (20 mEq total) by mouth daily. 06/15/21  Yes Val Riles, MD  sacubitril-valsartan (ENTRESTO) 24-26 MG Take 1  tablet by mouth 2 (two) times daily. 06/15/21  Yes Val Riles, MD  spironolactone (ALDACTONE) 25 MG tablet Take 1 tablet (25 mg total) by mouth daily. 06/15/21  Yes Val Riles, MD  Vitamin D, Ergocalciferol, (DRISDOL) 1.25 MG (50000 UNIT) CAPS capsule Take 1 capsule (50,000 Units total) by mouth every 7 (seven) days. 06/23/21 09/21/21 Yes Val Riles, MD  blood glucose meter kit and supplies Dispense based on patient and insurance preference. Check blood sugar 4 times daily, three times before meals and once before bedtime. (FOR ICD-10 E10.9, E11.9). Patient not taking: Reported on 09/05/2021 08/30/21   Alisa Graff, FNP  gabapentin (NEURONTIN) 300 MG capsule Take 1 capsule (300 mg total) by mouth 3 (three) times daily. Patient not taking: Reported on 08/30/2021 07/02/20   Isla Pence, MD    Review of Systems  Constitutional:  Positive for fatigue. Negative for appetite change.  HENT:  Negative for congestion, postnasal drip and sore throat.   Eyes: Negative.   Respiratory:  Positive for shortness of breath. Negative for cough.   Cardiovascular:  Positive for palpitations and leg swelling. Negative for chest pain.  Gastrointestinal:  Positive for nausea. Negative for abdominal distention and abdominal pain.  Endocrine: Negative.   Genitourinary: Negative.   Musculoskeletal:  Positive for arthralgias (leg pain; has been out of neurontin). Negative for back pain.  Skin: Negative.   Allergic/Immunologic: Negative.   Neurological:  Positive for dizziness and numbness (& tingling in feet). Negative for light-headedness.  Hematological:  Negative for adenopathy. Does not bruise/bleed easily.  Psychiatric/Behavioral:  Positive for sleep disturbance (sleeping on 1.5-2 pillows due to SOB). Negative for dysphoric mood. The patient is not nervous/anxious.    Vitals:   09/05/21 1349  BP: (!) 139/108  Pulse: (!) 111  Resp: 18  SpO2: 99%  Weight: 135 lb (61.2 kg)  Height: 5' (1.524 m)    Wt Readings from Last 3 Encounters:  09/05/21 135 lb (61.2 kg)  08/30/21 136 lb 2 oz (61.7 kg)  08/28/21 145 lb (65.8 kg)   Lab Results  Component Value Date   CREATININE 0.63 09/05/2021   CREATININE 0.64 08/30/2021   CREATININE 0.70 08/28/2021   Physical Exam Vitals and nursing note reviewed. Exam conducted with a chaperone present (niece).  Constitutional:      Appearance: Normal appearance.  HENT:     Head: Normocephalic and atraumatic.  Cardiovascular:     Rate and Rhythm: Regular rhythm. Tachycardia present.  Pulmonary:     Effort: Pulmonary effort is normal. No respiratory distress.     Breath sounds: No wheezing or rales.  Abdominal:     General: There is no distension.     Palpations: Abdomen is soft.  Musculoskeletal:  General: No tenderness.     Cervical back: Normal range of motion and neck supple.     Right lower leg: Edema (1+ pitting) present.     Left lower leg: Edema (1+ pitting) present.  Skin:    General: Skin is warm and dry.  Neurological:     General: No focal deficit present.     Mental Status: She is alert and oriented to person, place, and time.  Psychiatric:        Mood and Affect: Mood normal.        Behavior: Behavior normal.        Thought Content: Thought content normal.   Assessment & Plan:  1: Acute on Chronic heart failure with reduced ejection fraction- - NYHA class III - continues to be fluid overloaded although slightly better from last week - weighing daily; reminded to call for an overnight weight gain of > 2 pounds or a weekly weight gain of >5 pounds - weight stable from last visit here 6 days ago - due to continued pedal edema and symptoms, will have patient return today for 79m IV lasix/ 467m PO potassium - check BNP/BMP today - she is trying to drink less fluids and stay closer to 64 ounces daily - not adding salt to her food - on GDMT of carvedilol, entresto and spironolactone - will add jardiance 101maily -  check BMP in 1 month - BNP 08/30/21 was 746.7  2: HTN- - BP elevated (139/108) but, again, she did not take her medications prior to coming today - emphasized that she MUST take her medications prior to coming to her appointments so we can assess the efficacy  - has PCP scheduled with [facility] for 10/25/21 - BMP 08/30/21 reviewed and showed sodium 133, potassium 3.7, creatinine 0.64 & GFR >60  3: DM- - not checking her glucose at home even though she's on insulin because she says that she doesn't have glucometer/supplies - fasting glucose check in the office today was 447 - starting jardiance per above for HF but it should also help some with her diabetes  4: Lymphedema-  - stage 2 - not elevating her legs much and she was encouraged to elevate them when sitting for long periods of time - encouraged to wear compression socks daily with removal at bedtime - limited in her ability to exercise due to symptoms - consider compression boots if edema persists   Patient did not bring her medications nor a list. Each medication was verbally reviewed with the patient and she was encouraged to bring the bottles to every visit to confirm accuracy of list.   Return in 1 week, sooner if needed.

## 2021-09-05 NOTE — Patient Instructions (Addendum)
Continue weighing daily and call for an overnight weight gain of 3 pounds or more or a weekly weight gain of more than 5 pounds.    [facility] will be moving around the corner to [address] mid-February. Our phone number will remain the same   Begin taking jardiance as 1 tablet every morning.    Take your medications prior to coming to the appointment.

## 2021-09-12 NOTE — Progress Notes (Signed)
Patient ID: Cheryl Mercer, female    DOB: [birth year], 36 y.o.   MRN: [id number]  HPI  Cheryl Mercer is a 36 y/o female with a history of DM, HTN, chronic back pain, migraines, PE and chronic heart failure.   Echo report from 06/15/21 reviewed and showed an EF of 30-35% along with moderate LAE, mild LVH and mild MR.   RHC/LHC done 03/25/2019 and showed: Ao = 122/88 (104) LV = 125/15 RA = 2 RV = 35/6 PA = 37/14 (25) PCW = 18 Fick cardiac output/index = 5.3/3.2 SVR = 1543 PVR = 1.3 WU Ao sat = 99% PA sat = 74%, 76%  Assessment: 1. Normal coronary arteries 2. 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 Well-compensated hemodynamics  Was in the ED 08/28/21 due to leg swelling due to acute on chronic HF.Given IV lasix. Noted to be hyperglycemic with initial glucose of 720 with repeat of 509.She was released.   She presents today for a follow-up visit with a chief complaint of moderate fatigue with minimal exertion. She describes this as chronic in nature. She has associated shortness of breath, pedal edema, palpitation, dizziness, neuropathy in feet/legs and chronic difficulty sleeping. She denies any abdominal distention, cough or weight gain.   Received 72m IV lasix/ 451m PO last week but doesn't feel like the IV lasix helped much. Started jardiance and says that it makes her very sleepy and would like to take it later in the day instead of in the morning.   She hasn't taken any of her medications yet today. Not checking her glucose because she doesn't have a glucometer or supplies and doesn't see the new PCP until March. Continues to take her insulin.  Past Medical History:  Diagnosis Date   CHF (congestive heart failure) (HCC)    Chronic back pain    Chronic combined systolic [place] diastolic [place]ive heart [place]2021   Community acquired pneumonia 12/16/2019   [place]   History of COVID-19 11/07/2019   Migraines    No pertinent past medical  history    Pregnancy induced hypertension    [place] ([facility]   [place]   Past Surgical History:  Procedure Laterality Date   CERVICAL BIOPSY  W/ LOOP ELECTRODE EXCISION  2009   Laparoscopic tubal sterilization with Falope rings.  2009   RIGHT/LEFT HEART CATH AND CORONARY ANGIOGRAPHY N/A 03/25/2019   Procedure: RIGHT/LEFT HEART CATH AND CORONARY ANGIOGRAPHY;  Surgeon: BeJolaine ArtistMD;  [facility];  Service: Cardiovascular;  Laterality: N/A;   TUBAL LIGATION     Family History  Problem Relation Age of Onset   Hypertension Mother    Anesthesia problems Neg Hx    Social History   Tobacco Use   Smoking status: Never   Smokeless tobacco: Never  Substance Use Topics   Alcohol use: No    Alcohol/week: 6.0 standard drinks    Types: 6 Shots of liquor per week   Allergies  Allergen Reactions   Ibuprofen Anaphylaxis   Prior to Admission medications   Medication Sig Start Date End Date Taking? Authorizing Provider  atorvastatin (LIPITOR) 40 MG tablet Take 1 tablet (40 mg total) by mouth daily at 6 PM. 06/15/21  Yes KuVal RilesMD  carvedilol (COREG) 6.25 MG tablet Take 1 tablet (6.25 mg total) by mouth 2 (two) times daily with a meal. 06/15/21  Yes KuVal RilesMD  empagliflozin (JARDIANCE) 10 MG TABS tablet  Take 1 tablet (10 mg total) by mouth daily before breakfast. 09/05/21  Yes Darylene Price A, FNP  insulin aspart (NOVOLOG FLEXPEN) 100 UNIT/ML FlexPen Inject 2-15 Units into the skin 3 (three) times daily after meals. CBG 121 - 150: 2 units, 151 - 200: 3 units, 201 - 250: 5 units, 251 - 300: 8 units, 301 - 350: 11 units, 351 - 400: 15u 06/15/21  Yes Val Riles, MD  insulin glargine (LANTUS) 100 UNIT/ML Solostar Pen Inject 40 Units into the skin daily. 06/15/21  Yes Val Riles, MD  metFORMIN (GLUCOPHAGE) 500 MG tablet Take 1 tablet (500 mg total) by mouth 2 (two) times daily with a meal. 06/15/21  Yes Val Riles, MD  potassium chloride SA (KLOR-CON) 20 MEQ tablet Take 1 tablet (20 mEq total) by mouth daily. 06/15/21  Yes Val Riles, MD  sacubitril-valsartan (ENTRESTO) 24-26 MG Take 1 tablet by mouth 2 (two) times daily. 06/15/21  Yes Val Riles, MD  spironolactone (ALDACTONE) 25 MG tablet Take 1 tablet (25 mg total) by mouth daily. 06/15/21  Yes Val Riles, MD  Furosemide (LASIX) 40 MG tablet Take 1 tablet (40 mg total) by mouth daily.   Yes Darylene Price A, FNP  Vitamin D, Ergocalciferol, (DRISDOL) 1.25 MG (50000 UNIT) CAPS capsule Take 1 capsule (50,000 Units total) by mouth every 7 (seven) days. 06/23/21 09/21/21 Yes Val Riles, MD  blood glucose meter kit and supplies Dispense based on patient and insurance preference. Check blood sugar 4 times daily, three times before meals and once before bedtime. (FOR ICD-10 E10.9, E11.9). Patient not taking: Reported on 09/05/2021 08/30/21   Alisa Graff, FNP  gabapentin (NEURONTIN) 300 MG capsule Take 1 capsule (300 mg total) by mouth 3 (three) times daily. Patient not taking: Reported on 08/30/2021 07/02/20   Isla Pence, MD   Review of Systems  Constitutional:  Positive for fatigue. Negative for appetite change.  HENT:  Negative for congestion, postnasal drip and sore throat.   Eyes: Negative.   Respiratory:  Positive for shortness of breath. Negative for cough.   Cardiovascular:  Positive for palpitations and leg swelling. Negative for chest pain.  Gastrointestinal:  Positive for nausea. Negative for abdominal distention and abdominal pain.  Endocrine: Negative.   Genitourinary: Negative.   Musculoskeletal:  Positive for arthralgias (leg pain; has been out of neurontin). Negative for back pain.  Skin: Negative.   Allergic/Immunologic: Negative.   Neurological:  Positive for dizziness and numbness (& tingling in feet). Negative for light-headedness.  Hematological:  Negative for adenopathy. Does not bruise/bleed easily.   Psychiatric/Behavioral:  Positive for sleep disturbance (sleeping on 1.5-2 pillows due to SOB). Negative for dysphoric mood. The patient is not nervous/anxious.    Vitals:   09/13/21 0812  BP: (!) 143/109  Pulse: (!) 110  Resp: 18  SpO2: 100%  Weight: 131 lb 6 oz (59.6 kg)  Height: 5' (1.524 m)   Wt Readings from Last 3 Encounters:  09/13/21 131 lb 6 oz (59.6 kg)  09/05/21 135 lb (61.2 kg)  08/30/21 136 lb 2 oz (61.7 kg)   Lab Results  Component Value Date   CREATININE 0.63 09/05/2021   CREATININE 0.64 08/30/2021   CREATININE 0.70 08/28/2021    Physical Exam Vitals and nursing note reviewed. Exam conducted with a chaperone present (niece).  Constitutional:      Appearance: Normal appearance.  HENT:     Head: Normocephalic and atraumatic.  Cardiovascular:     Rate and Rhythm: Regular rhythm.  Tachycardia present.  Pulmonary:     Effort: Pulmonary effort is normal. No respiratory distress.     Breath sounds: No wheezing or rales.  Abdominal:     General: There is no distension.     Palpations: Abdomen is soft.  Musculoskeletal:        General: No tenderness.     Cervical back: Normal range of motion and neck supple.     Right lower leg: Edema (1+ pitting) present.     Left lower leg: Edema (1+ pitting) present.  Skin:    General: Skin is warm and dry.  Neurological:     General: No focal deficit present.     Mental Status: She is alert and oriented to person, place, and time.  Psychiatric:        Mood and Affect: Mood normal.        Behavior: Behavior normal.        Thought Content: Thought content normal.   Assessment & Plan:  1: Chronic heart failure with reduced ejection fraction- - NYHA class III - continues to be fluid overloaded although better from last week - weighing daily; reminded to call for an overnight weight gain of > 2 pounds or a weekly weight gain of >5 pounds - weight down 4 pounds from last visit here 1 week ago - she is trying to drink  less fluids and stay closer to 64 ounces daily - not adding salt to her food - on GDMT of carvedilol, entresto and spironolactone & jardiance - check BMP today - emphasized, again, that she must take her medications prior to her appointments so that we can assess medication efficacy - discussed increasing furosemide dose or changing to torsemide; she says that she doesn't feel like the furosemide is working for her and would like to try torsemide - will stop furosemide and begin torsemide 12m daily; will check BMP next visit  - BNP 09/05/21 was 1066.7  2: HTN- - BP elevated (143/109) but she did not take medications again prior to coming; changing diuretic per above - has PCP scheduled with [facility] for 10/25/21 - BMP 09/05/21 reviewed and showed sodium 130, potassium 4.1, creatinine 0.63 & GFR >60  3: DM- - call her pharmacy to find out what exactly she needed to check her glucose until she gets established with PCP in March - faxed in order for accucheck guide meter, accucheck test strips ##[id number] soft click lancets box of 1109and insulin pen needles 32G x 471m#150  - check glucose QID  4: Lymphedema-  - stage 2 - not elevating her legs much and she was encouraged to elevate them when sitting for long periods of time - encouraged to wear compression socks daily with removal at bedtime - limited in her ability to exercise due to symptoms - consider compression boots if edema persists   Patient did not bring her medications nor a list. Each medication was verbally reviewed with the patient and she was encouraged to bring the bottles to every visit to confirm accuracy of list.   Return in 1 week, sooner if needed

## 2021-09-13 ENCOUNTER — Ambulatory Visit (HOSPITAL_BASED_OUTPATIENT_CLINIC_OR_DEPARTMENT_OTHER): Payer: Medicaid Other | Admitting: Family

## 2021-09-13 ENCOUNTER — Other Ambulatory Visit: Payer: Self-pay

## 2021-09-13 ENCOUNTER — Encounter: Payer: Self-pay | Admitting: Family

## 2021-09-13 ENCOUNTER — Other Ambulatory Visit
Admission: RE | Admit: 2021-09-13 | Discharge: 2021-09-13 | Disposition: A | Discharge status: Home / Self Care | Payer: Medicaid Other | Source: Ambulatory Visit | Attending: Family | Admitting: Family

## 2021-09-13 VITALS — BP 143/109 | HR 110 | Resp 18 | Ht 60.0 in | Wt 131.4 lb

## 2021-09-13 DIAGNOSIS — Z86711 Personal history of pulmonary embolism: Secondary | ICD-10-CM | POA: Insufficient documentation

## 2021-09-13 DIAGNOSIS — G8929 Other chronic pain: Secondary | ICD-10-CM | POA: Insufficient documentation

## 2021-09-13 DIAGNOSIS — Z79899 Other long term (current) drug therapy: Secondary | ICD-10-CM | POA: Insufficient documentation

## 2021-09-13 DIAGNOSIS — G43909 Migraine, unspecified, not intractable, without status migrainosus: Secondary | ICD-10-CM | POA: Insufficient documentation

## 2021-09-13 DIAGNOSIS — I5042 Chronic combined systolic (congestive) and diastolic (congestive) heart failure: Secondary | ICD-10-CM

## 2021-09-13 DIAGNOSIS — E1140 Type 2 diabetes mellitus with diabetic neuropathy, unspecified: Secondary | ICD-10-CM | POA: Insufficient documentation

## 2021-09-13 DIAGNOSIS — Z09 Encounter for follow-up examination after completed treatment for conditions other than malignant neoplasm: Secondary | ICD-10-CM | POA: Diagnosis not present

## 2021-09-13 DIAGNOSIS — I890 Lymphedema, not elsewhere classified: Secondary | ICD-10-CM | POA: Diagnosis not present

## 2021-09-13 DIAGNOSIS — Z8616 Personal history of COVID-19: Secondary | ICD-10-CM | POA: Diagnosis not present

## 2021-09-13 DIAGNOSIS — I428 Other cardiomyopathies: Secondary | ICD-10-CM | POA: Insufficient documentation

## 2021-09-13 DIAGNOSIS — Z794 Long term (current) use of insulin: Secondary | ICD-10-CM | POA: Insufficient documentation

## 2021-09-13 DIAGNOSIS — E1165 Type 2 diabetes mellitus with hyperglycemia: Secondary | ICD-10-CM | POA: Insufficient documentation

## 2021-09-13 DIAGNOSIS — M549 Dorsalgia, unspecified: Secondary | ICD-10-CM | POA: Insufficient documentation

## 2021-09-13 DIAGNOSIS — Z7984 Long term (current) use of oral hypoglycemic drugs: Secondary | ICD-10-CM | POA: Insufficient documentation

## 2021-09-13 DIAGNOSIS — I10 Essential (primary) hypertension: Secondary | ICD-10-CM | POA: Diagnosis not present

## 2021-09-13 DIAGNOSIS — Z8249 Family history of ischemic heart disease and other diseases of the circulatory system: Secondary | ICD-10-CM | POA: Insufficient documentation

## 2021-09-13 DIAGNOSIS — Z7901 Long term (current) use of anticoagulants: Secondary | ICD-10-CM | POA: Diagnosis not present

## 2021-09-13 DIAGNOSIS — I110 Hypertensive heart disease with heart failure: Secondary | ICD-10-CM | POA: Insufficient documentation

## 2021-09-13 LAB — BASIC METABOLIC PANEL
Anion gap: 12 (ref 5–15)
BUN: 7 mg/dL (ref 6–20)
CO2: 28 mmol/L (ref 22–32)
Calcium: 8.4 mg/dL — ABNORMAL LOW (ref 8.9–10.3)
Chloride: 93 mmol/L — ABNORMAL LOW (ref 98–111)
Creatinine, Ser: 0.71 mg/dL (ref 0.44–1.00)
GFR, Estimated: >60 mL/min (ref >60)
Glucose, Bld: 477 mg/dL — ABNORMAL HIGH (ref 70–99)
Potassium: 3.5 mmol/L (ref 3.5–5.1)
Sodium: 133 mmol/L — ABNORMAL LOW (ref 135–145)

## 2021-09-13 MED ORDER — TORSEMIDE 20 MG PO TABS: 40.0000 mg | ORAL_TABLET | Freq: Every day | ORAL | 5 refills | Status: DC

## 2021-09-13 NOTE — Patient Instructions (Addendum)
Continue weighing daily and call for an overnight weight gain of 3 pounds or more or a weekly weight gain of more than 5 pounds.   The [facility] will be moving around the corner to [address] mid-February. Our phone number will remain the same    Stop taking furosemide and you will begin torsemide 40mg  daily. This will be 2 of the 20mg  tablets once a day. I have sent this prescription to your pharmacy.    Take your medications prior to coming to every appointment and bring your medications with you.

## 2021-09-16 ENCOUNTER — Ambulatory Visit: Payer: Medicaid Other | Admitting: Family

## 2021-09-19 NOTE — Progress Notes (Deleted)
Patient ID: Cheryl Mercer, female    DOB: 1986-02-18, 36 y.o.   MRN: [id number]  HPI  Cheryl Mercer is a 36 y/o female with a history of DM, HTN, chronic back pain, migraines, PE and chronic heart failure.   Echo report from 06/15/21 reviewed and showed an EF of 30-35% along with moderate LAE, mild LVH and mild MR.   RHC/LHC done 03/25/2019 and showed: Ao = 122/88 (104) LV = 125/15 RA = 2 RV = 35/6 PA = 37/14 (25) PCW = 18 Fick cardiac output/index = 5.3/3.2 SVR = 1543 PVR = 1.3 WU Ao sat = 99% PA sat = 74%, 76%  Assessment: 1. Normal coronary arteries 2. 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 Well-compensated hemodynamics  Was in the ED 08/28/21 due to leg swelling due to acute on chronic HF.Given IV lasix. Noted to be hyperglycemic with initial glucose of 720 with repeat of 509.She was released.   She presents today for a follow-up visit with a chief complaint of   Past Medical History:  Diagnosis Date   CHF (congestive heart failure) (HCC)    Chronic back pain    Chronic combined systolic and diastolic congestive heart failure (HCC) 09/07/2019   Community acquired pneumonia 12/16/2019   Diabetes mellitus without complication (HCC)    History of COVID-19 11/07/2019   Migraines    No pertinent past medical history    Pregnancy induced hypertension    Pulmonary embolus (HCC) 12/16/2019   Pulmonary infarct (HCC) 12/17/2019   Past Surgical History:  Procedure Laterality Date   CERVICAL BIOPSY  W/ LOOP ELECTRODE EXCISION  2009   Laparoscopic tubal sterilization with Falope rings.  2009   RIGHT/LEFT HEART CATH AND CORONARY ANGIOGRAPHY N/A 03/25/2019   Procedure: RIGHT/LEFT HEART CATH AND CORONARY ANGIOGRAPHY;  Surgeon: Dolores Patty, MD;  Location: [facility];  Service: Cardiovascular;  Laterality: N/A;   TUBAL LIGATION     Family History  Problem Relation Age of Onset   Hypertension Mother    Anesthesia problems Neg Hx    Social History   Tobacco Use   Smoking  status: Never   Smokeless tobacco: Never  Substance Use Topics   Alcohol use: No    Alcohol/week: 6.0 standard drinks    Types: 6 Shots of liquor per week   Allergies  Allergen Reactions   Ibuprofen Anaphylaxis    Review of Systems  Constitutional:  Positive for fatigue. Negative for appetite change.  HENT:  Negative for congestion, postnasal drip and sore throat.   Eyes: Negative.   Respiratory:  Positive for shortness of breath. Negative for cough.   Cardiovascular:  Positive for palpitations and leg swelling. Negative for chest pain.  Gastrointestinal:  Positive for nausea. Negative for abdominal distention and abdominal pain.  Endocrine: Negative.   Genitourinary: Negative.   Musculoskeletal:  Positive for arthralgias (leg pain; has been out of neurontin). Negative for back pain.  Skin: Negative.   Allergic/Immunologic: Negative.   Neurological:  Positive for dizziness and numbness (& tingling in feet). Negative for light-headedness.  Hematological:  Negative for adenopathy. Does not bruise/bleed easily.  Psychiatric/Behavioral:  Positive for sleep disturbance (sleeping on 1.5-2 pillows due to SOB). Negative for dysphoric mood. The patient is not nervous/anxious.       Physical Exam Vitals and nursing note reviewed. Exam conducted with a chaperone present (niece).  Constitutional:      Appearance: Normal appearance.  HENT:     Head: Normocephalic and atraumatic.  Cardiovascular:     Rate and Rhythm: Regular rhythm. Tachycardia present.  Pulmonary:     Effort: Pulmonary effort is normal. No respiratory distress.     Breath sounds: No wheezing or rales.  Abdominal:     General: There is no distension.     Palpations: Abdomen is soft.  Musculoskeletal:        General: No tenderness.     Cervical back: Normal range of motion and neck supple.     Right lower leg: Edema (1+ pitting) present.     Left lower leg: Edema (1+ pitting) present.  Skin:    General: Skin is  warm and dry.  Neurological:     General: No focal deficit present.     Mental Status: She is alert and oriented to person, place, and time.  Psychiatric:        Mood and Affect: Mood normal.        Behavior: Behavior normal.        Thought Content: Thought content normal.   Assessment & Plan:  1: Chronic heart failure with reduced ejection fraction- - NYHA class III - continues to be fluid overloaded although better from last week - weighing daily; reminded to call for an overnight weight gain of > 2 pounds or a weekly weight gain of >5 pounds - weight 131.6 pounds from last visit here 1 week ago - she is trying to drink less fluids and stay closer to 64 ounces daily - not adding salt to her food - on GDMT of carvedilol, entresto and spironolactone & jardiance - emphasized, again, that she must take her medications prior to her appointments so that we can assess medication efficacy - check BMP today as diuretic changed last visit - BNP 09/05/21 was 1066.7  2: HTN- - BP  - has PCP scheduled with [facility] for 10/25/21 - BMP 09/13/21 reviewed and showed sodium 133, potassium 3.5, creatinine 0.71 & GFR >60  3: DM-  - faxed in order for accucheck guide meter, accucheck test strips #100, soft click lancets box of 100 and insulin pen needles 32G x 68mm #150  - check glucose QID  4: Lymphedema-  - stage 2 - not elevating her legs much and she was encouraged to elevate them when sitting for long periods of time - encouraged to wear compression socks daily with removal at bedtime - limited in her ability to exercise due to symptoms - consider compression boots if edema persists   Patient did not bring her medications nor a list. Each medication was verbally reviewed with the patient and she was encouraged to bring the bottles to every visit to confirm accuracy of list.

## 2021-09-20 ENCOUNTER — Telehealth: Payer: Self-pay | Admitting: Family

## 2021-09-20 ENCOUNTER — Ambulatory Visit: Payer: Medicaid Other | Admitting: Family

## 2021-09-20 NOTE — Telephone Encounter (Signed)
Patient did not show for her [facility] appointment on 09/20/21 even after patient confirmed appointment. Will attempt to reschedule.

## 2021-10-25 ENCOUNTER — Ambulatory Visit: Payer: Self-pay | Admitting: Internal Medicine

## 2021-12-31 ENCOUNTER — Inpatient Hospital Stay
Admission: EM | Admit: 2021-12-31 | Discharge: 2022-01-02 | DRG: 871 | Disposition: A | Discharge status: Home / Self Care | Payer: Medicare Other | Attending: Internal Medicine | Admitting: Internal Medicine

## 2021-12-31 ENCOUNTER — Emergency Department: Payer: Medicare Other

## 2021-12-31 ENCOUNTER — Other Ambulatory Visit: Payer: Self-pay

## 2021-12-31 ENCOUNTER — Encounter: Payer: Self-pay | Admitting: Emergency Medicine

## 2021-12-31 DIAGNOSIS — E119 Type 2 diabetes mellitus without complications: Secondary | ICD-10-CM

## 2021-12-31 DIAGNOSIS — Z79899 Other long term (current) drug therapy: Secondary | ICD-10-CM | POA: Diagnosis not present

## 2021-12-31 DIAGNOSIS — I420 Dilated cardiomyopathy: Secondary | ICD-10-CM | POA: Diagnosis present

## 2021-12-31 DIAGNOSIS — I509 Heart failure, unspecified: Secondary | ICD-10-CM | POA: Diagnosis not present

## 2021-12-31 DIAGNOSIS — Z86711 Personal history of pulmonary embolism: Secondary | ICD-10-CM | POA: Diagnosis not present

## 2021-12-31 DIAGNOSIS — I5023 Acute on chronic systolic (congestive) heart failure: Secondary | ICD-10-CM

## 2021-12-31 DIAGNOSIS — Z794 Long term (current) use of insulin: Secondary | ICD-10-CM | POA: Diagnosis not present

## 2021-12-31 DIAGNOSIS — B9789 Other viral agents as the cause of diseases classified elsewhere: Secondary | ICD-10-CM | POA: Diagnosis present

## 2021-12-31 DIAGNOSIS — Z8616 Personal history of COVID-19: Secondary | ICD-10-CM | POA: Diagnosis not present

## 2021-12-31 DIAGNOSIS — T383X6A Underdosing of insulin and oral hypoglycemic [antidiabetic] drugs, initial encounter: Secondary | ICD-10-CM | POA: Diagnosis present

## 2021-12-31 DIAGNOSIS — A4189 Other specified sepsis: Principal | ICD-10-CM | POA: Diagnosis present

## 2021-12-31 DIAGNOSIS — G8929 Other chronic pain: Secondary | ICD-10-CM | POA: Diagnosis present

## 2021-12-31 DIAGNOSIS — A419 Sepsis, unspecified organism: Principal | ICD-10-CM

## 2021-12-31 DIAGNOSIS — Z91199 Patient's noncompliance with other medical treatment and regimen due to unspecified reason: Secondary | ICD-10-CM

## 2021-12-31 DIAGNOSIS — J189 Pneumonia, unspecified organism: Secondary | ICD-10-CM | POA: Diagnosis present

## 2021-12-31 DIAGNOSIS — Z7984 Long term (current) use of oral hypoglycemic drugs: Secondary | ICD-10-CM | POA: Diagnosis not present

## 2021-12-31 DIAGNOSIS — B348 Other viral infections of unspecified site: Secondary | ICD-10-CM | POA: Diagnosis not present

## 2021-12-31 DIAGNOSIS — Z886 Allergy status to analgesic agent status: Secondary | ICD-10-CM

## 2021-12-31 DIAGNOSIS — I110 Hypertensive heart disease with heart failure: Secondary | ICD-10-CM | POA: Diagnosis present

## 2021-12-31 DIAGNOSIS — I5043 Acute on chronic combined systolic (congestive) and diastolic (congestive) heart failure: Secondary | ICD-10-CM | POA: Diagnosis present

## 2021-12-31 DIAGNOSIS — I959 Hypotension, unspecified: Secondary | ICD-10-CM | POA: Diagnosis present

## 2021-12-31 DIAGNOSIS — R739 Hyperglycemia, unspecified: Secondary | ICD-10-CM

## 2021-12-31 DIAGNOSIS — Z8249 Family history of ischemic heart disease and other diseases of the circulatory system: Secondary | ICD-10-CM | POA: Diagnosis not present

## 2021-12-31 DIAGNOSIS — E1065 Type 1 diabetes mellitus with hyperglycemia: Secondary | ICD-10-CM | POA: Diagnosis present

## 2021-12-31 DIAGNOSIS — R059 Cough, unspecified: Secondary | ICD-10-CM | POA: Diagnosis not present

## 2021-12-31 DIAGNOSIS — Z91148 Patient's other noncompliance with medication regimen for other reason: Secondary | ICD-10-CM

## 2021-12-31 DIAGNOSIS — E1165 Type 2 diabetes mellitus with hyperglycemia: Secondary | ICD-10-CM

## 2021-12-31 DIAGNOSIS — I5022 Chronic systolic (congestive) heart failure: Secondary | ICD-10-CM

## 2021-12-31 DIAGNOSIS — Z789 Other specified health status: Secondary | ICD-10-CM | POA: Insufficient documentation

## 2021-12-31 HISTORY — DX: Sepsis, unspecified organism: A41.9

## 2021-12-31 LAB — COMPREHENSIVE METABOLIC PANEL
ALT: 19 U/L (ref 0–44)
AST: 66 U/L — ABNORMAL HIGH (ref 15–41)
Albumin: 2.2 g/dL — ABNORMAL LOW (ref 3.5–5.0)
Alkaline Phosphatase: 224 U/L — ABNORMAL HIGH (ref 38–126)
Anion gap: 11 (ref 5–15)
BUN: 8 mg/dL (ref 6–20)
CO2: 29 mmol/L (ref 22–32)
Calcium: 7.5 mg/dL — ABNORMAL LOW (ref 8.9–10.3)
Chloride: 87 mmol/L — ABNORMAL LOW (ref 98–111)
Creatinine, Ser: 0.73 mg/dL (ref 0.44–1.00)
GFR, Estimated: >60 mL/min (ref >60)
Glucose, Bld: 677 mg/dL (ref 70–99)
Potassium: 3.3 mmol/L — ABNORMAL LOW (ref 3.5–5.1)
Sodium: 127 mmol/L — ABNORMAL LOW (ref 135–145)
Total Bilirubin: 1 mg/dL (ref 0.3–1.2)
Total Protein: 6.7 g/dL (ref 6.5–8.1)

## 2021-12-31 LAB — CBC WITH DIFFERENTIAL/PLATELET
Abs Immature Granulocytes: 0.06 10*3/uL (ref 0.00–0.07)
Basophils Absolute: 0.1 10*3/uL (ref 0.0–0.1)
Basophils Relative: 0 %
Eosinophils Absolute: 0 10*3/uL (ref 0.0–0.5)
Eosinophils Relative: 0 %
HCT: 35.8 % — ABNORMAL LOW (ref 36.0–46.0)
Hemoglobin: 12.1 g/dL (ref 12.0–15.0)
Immature Granulocytes: 1 %
Lymphocytes Relative: 7 %
Lymphs Abs: 0.8 10*3/uL (ref 0.7–4.0)
MCH: 29.9 pg (ref 26.0–34.0)
MCHC: 33.8 g/dL (ref 30.0–36.0)
MCV: 88.4 fL (ref 80.0–100.0)
Monocytes Absolute: 0.9 10*3/uL (ref 0.1–1.0)
Monocytes Relative: 7 %
Neutro Abs: 10.3 10*3/uL — ABNORMAL HIGH (ref 1.7–7.7)
Neutrophils Relative %: 85 %
Platelets: 234 10*3/uL (ref 150–400)
RBC: 4.05 MIL/uL (ref 3.87–5.11)
RDW: 13.3 % (ref 11.5–15.5)
WBC: 12.1 10*3/uL — ABNORMAL HIGH (ref 4.0–10.5)
nRBC: 0 % (ref 0.0–0.2)

## 2021-12-31 LAB — RESPIRATORY PANEL BY PCR

## 2021-12-31 LAB — URINALYSIS, COMPLETE (UACMP) WITH MICROSCOPIC
Bilirubin Urine: NEGATIVE
Glucose, UA: >=500 mg/dL — AB
Hgb urine dipstick: NEGATIVE
Ketones, ur: NEGATIVE mg/dL
Leukocytes,Ua: NEGATIVE
Nitrite: NEGATIVE
Protein, ur: 100 mg/dL — AB
Specific Gravity, Urine: 1.031 — ABNORMAL HIGH (ref 1.005–1.030)
pH: 6 (ref 5.0–8.0)

## 2021-12-31 LAB — URINE DRUG SCREEN, QUALITATIVE (ARMC ONLY)
Amphetamines, Ur Screen: POSITIVE — AB
Barbiturates, Ur Screen: NOT DETECTED
Benzodiazepine, Ur Scrn: NOT DETECTED
Cannabinoid 50 Ng, Ur ~~LOC~~: NOT DETECTED
Cocaine Metabolite,Ur ~~LOC~~: NOT DETECTED
MDMA (Ecstasy)Ur Screen: NOT DETECTED
Methadone Scn, Ur: NOT DETECTED
Opiate, Ur Screen: POSITIVE — AB
Phencyclidine (PCP) Ur S: NOT DETECTED
Tricyclic, Ur Screen: NOT DETECTED

## 2021-12-31 LAB — CORTISOL-AM, BLOOD: Cortisol - AM: 14.6 ug/dL (ref 6.7–22.6)

## 2021-12-31 LAB — CBG MONITORING, ED
Glucose-Capillary: 583 mg/dL (ref 70–99)
Glucose-Capillary: 403 mg/dL — ABNORMAL HIGH (ref 70–99)
Glucose-Capillary: 241 mg/dL — ABNORMAL HIGH (ref 70–99)
Glucose-Capillary: 292 mg/dL — ABNORMAL HIGH (ref 70–99)
Glucose-Capillary: 317 mg/dL — ABNORMAL HIGH (ref 70–99)

## 2021-12-31 LAB — BETA-HYDROXYBUTYRIC ACID: Beta-Hydroxybutyric Acid: 0.09 mmol/L (ref 0.05–0.27)

## 2021-12-31 LAB — RESP PANEL BY RT-PCR (FLU A&B, COVID) ARPGX2
Influenza A by PCR: NEGATIVE
Influenza B by PCR: NEGATIVE
SARS Coronavirus 2 by RT PCR: NEGATIVE

## 2021-12-31 LAB — PROTIME-INR
INR: 1.3 — ABNORMAL HIGH (ref 0.8–1.2)
INR: 1.4 — ABNORMAL HIGH (ref 0.8–1.2)
Prothrombin Time: 16 seconds — ABNORMAL HIGH (ref 11.4–15.2)
Prothrombin Time: 16.9 seconds — ABNORMAL HIGH (ref 11.4–15.2)

## 2021-12-31 LAB — APTT: aPTT: 42 seconds — ABNORMAL HIGH (ref 24–36)

## 2021-12-31 LAB — TROPONIN I (HIGH SENSITIVITY)
Troponin I (High Sensitivity): 17 ng/L (ref <18)
Troponin I (High Sensitivity): 15 ng/L (ref <18)

## 2021-12-31 LAB — BRAIN NATRIURETIC PEPTIDE: B Natriuretic Peptide: 1709.8 pg/mL — ABNORMAL HIGH (ref 0.0–100.0)

## 2021-12-31 LAB — CBC
HCT: 34.7 % — ABNORMAL LOW (ref 36.0–46.0)
Hemoglobin: 11.8 g/dL — ABNORMAL LOW (ref 12.0–15.0)
MCH: 29.7 pg (ref 26.0–34.0)
MCHC: 34 g/dL (ref 30.0–36.0)
MCV: 87.4 fL (ref 80.0–100.0)
Platelets: 262 10*3/uL (ref 150–400)
RBC: 3.97 MIL/uL (ref 3.87–5.11)
RDW: 12.9 % (ref 11.5–15.5)
WBC: 14.4 10*3/uL — ABNORMAL HIGH (ref 4.0–10.5)
nRBC: 0 % (ref 0.0–0.2)

## 2021-12-31 LAB — GLUCOSE, CAPILLARY
Glucose-Capillary: 377 mg/dL — ABNORMAL HIGH (ref 70–99)
Glucose-Capillary: 432 mg/dL — ABNORMAL HIGH (ref 70–99)

## 2021-12-31 LAB — CREATININE, SERUM
Creatinine, Ser: 0.59 mg/dL (ref 0.44–1.00)
GFR, Estimated: >60 mL/min (ref >60)

## 2021-12-31 LAB — PROCALCITONIN
Procalcitonin: 0.47 ng/mL
Procalcitonin: 0.54 ng/mL

## 2021-12-31 LAB — LACTIC ACID, PLASMA
Lactic Acid, Venous: 3.4 mmol/L (ref 0.5–1.9)
Lactic Acid, Venous: 3 mmol/L (ref 0.5–1.9)
Lactic Acid, Venous: 2.6 mmol/L (ref 0.5–1.9)

## 2021-12-31 LAB — PHOSPHORUS: Phosphorus: 2.2 mg/dL — ABNORMAL LOW (ref 2.5–4.6)

## 2021-12-31 LAB — MAGNESIUM: Magnesium: 1.8 mg/dL (ref 1.7–2.4)

## 2021-12-31 LAB — POC URINE PREG, ED: Preg Test, Ur: NEGATIVE

## 2021-12-31 MED ORDER — MIDODRINE HCL 5 MG PO TABS
5.0000 mg | ORAL_TABLET | Freq: Three times a day (TID) | ORAL | Status: DC
  Administered 2021-12-31: 5 mg via ORAL
  Filled 2021-12-31: qty 1

## 2021-12-31 MED ORDER — EMPAGLIFLOZIN 10 MG PO TABS
10.0000 mg | ORAL_TABLET | Freq: Every day | ORAL | Status: DC
  Administered 2021-12-31 – 2022-01-02 (×3): 10 mg via ORAL
  Filled 2021-12-31 (×3): qty 1

## 2021-12-31 MED ORDER — POTASSIUM CHLORIDE CRYS ER 20 MEQ PO TBCR
20.0000 meq | EXTENDED_RELEASE_TABLET | Freq: Every day | ORAL | Status: DC
  Administered 2021-12-31 – 2022-01-02 (×3): 20 meq via ORAL
  Filled 2021-12-31 (×3): qty 1

## 2021-12-31 MED ORDER — ONDANSETRON HCL 4 MG PO TABS
4.0000 mg | ORAL_TABLET | Freq: Four times a day (QID) | ORAL | Status: DC | PRN
  Administered 2022-01-01: 4 mg via ORAL
  Filled 2021-12-31: qty 1

## 2021-12-31 MED ORDER — GUAIFENESIN-DM 100-10 MG/5ML PO SYRP
5.0000 mL | ORAL_SOLUTION | ORAL | Status: DC | PRN
  Administered 2021-12-31 – 2022-01-02 (×4): 5 mL via ORAL
  Filled 2021-12-31 (×5): qty 10

## 2021-12-31 MED ORDER — ALBUTEROL SULFATE (2.5 MG/3ML) 0.083% IN NEBU
2.5000 mg | INHALATION_SOLUTION | Freq: Four times a day (QID) | RESPIRATORY_TRACT | Status: DC
  Administered 2021-12-31 – 2022-01-01 (×7): 2.5 mg via RESPIRATORY_TRACT
  Filled 2021-12-31 (×7): qty 3

## 2021-12-31 MED ORDER — INSULIN ASPART 100 UNIT/ML IJ SOLN
8.0000 [IU] | Freq: Three times a day (TID) | INTRAMUSCULAR | Status: DC
  Administered 2022-01-01 – 2022-01-02 (×4): 8 [IU] via SUBCUTANEOUS
  Filled 2021-12-31 (×4): qty 1

## 2021-12-31 MED ORDER — SODIUM CHLORIDE 0.9 % IV SOLN
2.0000 g | INTRAVENOUS | Status: DC
  Administered 2021-12-31: 2 g via INTRAVENOUS
  Filled 2021-12-31 (×2): qty 20

## 2021-12-31 MED ORDER — ATORVASTATIN CALCIUM 20 MG PO TABS
40.0000 mg | ORAL_TABLET | Freq: Every day | ORAL | Status: DC
  Administered 2021-12-31 – 2022-01-01 (×2): 40 mg via ORAL
  Filled 2021-12-31 (×2): qty 2

## 2021-12-31 MED ORDER — ACETAMINOPHEN 650 MG RE SUPP: 650.0000 mg | Freq: Four times a day (QID) | RECTAL | Status: DC | PRN

## 2021-12-31 MED ORDER — LACTATED RINGERS IV SOLN: INTRAVENOUS | Status: AC

## 2021-12-31 MED ORDER — INSULIN ASPART 100 UNIT/ML IJ SOLN
1.0000 [IU] | Freq: Once | INTRAMUSCULAR | Status: AC
  Administered 2021-12-31: 1 [IU] via SUBCUTANEOUS
  Filled 2021-12-31: qty 1

## 2021-12-31 MED ORDER — INSULIN GLARGINE-YFGN 100 UNIT/ML ~~LOC~~ SOLN
40.0000 [IU] | Freq: Every day | SUBCUTANEOUS | Status: DC
  Administered 2021-12-31 – 2022-01-02 (×3): 40 [IU] via SUBCUTANEOUS
  Filled 2021-12-31 (×3): qty 0.4

## 2021-12-31 MED ORDER — ENOXAPARIN SODIUM 40 MG/0.4ML IJ SOSY
40.0000 mg | PREFILLED_SYRINGE | INTRAMUSCULAR | Status: DC
  Administered 2021-12-31 – 2022-01-02 (×3): 40 mg via SUBCUTANEOUS
  Filled 2021-12-31 (×3): qty 0.4

## 2021-12-31 MED ORDER — FUROSEMIDE 10 MG/ML IJ SOLN
40.0000 mg | Freq: Once | INTRAMUSCULAR | Status: AC
Start: 2021-12-31 — End: 2021-12-31
  Administered 2021-12-31: 40 mg via INTRAVENOUS
  Filled 2021-12-31: qty 4

## 2021-12-31 MED ORDER — VANCOMYCIN HCL 1500 MG/300ML IV SOLN
1500.0000 mg | Freq: Once | INTRAVENOUS | Status: AC
  Administered 2021-12-31: 1500 mg via INTRAVENOUS
  Filled 2021-12-31: qty 300

## 2021-12-31 MED ORDER — SODIUM CHLORIDE 0.9 % IV SOLN
500.0000 mg | INTRAVENOUS | Status: DC
  Administered 2021-12-31 – 2022-01-01 (×2): 500 mg via INTRAVENOUS
  Filled 2021-12-31: qty 500
  Filled 2021-12-31: qty 5

## 2021-12-31 MED ORDER — METHYLPREDNISOLONE SODIUM SUCC 40 MG IJ SOLR: 40.0000 mg | Freq: Every day | INTRAMUSCULAR | Status: DC

## 2021-12-31 MED ORDER — ONDANSETRON HCL 4 MG/2ML IJ SOLN
4.0000 mg | Freq: Once | INTRAMUSCULAR | Status: AC
  Administered 2021-12-31: 4 mg via INTRAVENOUS
  Filled 2021-12-31: qty 2

## 2021-12-31 MED ORDER — INSULIN ASPART 100 UNIT/ML IJ SOLN
0.0000 [IU] | Freq: Three times a day (TID) | INTRAMUSCULAR | Status: DC
  Administered 2021-12-31 (×2): 11 [IU] via SUBCUTANEOUS
  Administered 2021-12-31: 8 [IU] via SUBCUTANEOUS
  Administered 2022-01-01: 2 [IU] via SUBCUTANEOUS
  Administered 2022-01-01: 8 [IU] via SUBCUTANEOUS
  Administered 2022-01-01: 11 [IU] via SUBCUTANEOUS
  Administered 2022-01-02: 3 [IU] via SUBCUTANEOUS
  Filled 2021-12-31 (×7): qty 1

## 2021-12-31 MED ORDER — MIDODRINE HCL 5 MG PO TABS
10.0000 mg | ORAL_TABLET | Freq: Three times a day (TID) | ORAL | Status: DC
Start: 2021-12-31 — End: 2021-12-31
  Administered 2021-12-31: 10 mg via ORAL
  Filled 2021-12-31 (×2): qty 2

## 2021-12-31 MED ORDER — MIDODRINE HCL 5 MG PO TABS: 2.5000 mg | ORAL_TABLET | Freq: Three times a day (TID) | ORAL | Status: DC

## 2021-12-31 MED ORDER — GUAIFENESIN-CODEINE 100-10 MG/5ML PO SOLN
10.0000 mL | Freq: Once | ORAL | Status: AC
  Administered 2021-12-31: 10 mL via ORAL
  Filled 2021-12-31: qty 10

## 2021-12-31 MED ORDER — ACETAMINOPHEN 500 MG PO TABS
1000.0000 mg | ORAL_TABLET | Freq: Once | ORAL | Status: AC
  Administered 2021-12-31: 1000 mg via ORAL
  Filled 2021-12-31: qty 2

## 2021-12-31 MED ORDER — INSULIN ASPART 100 UNIT/ML IJ SOLN
10.0000 [IU] | Freq: Once | INTRAMUSCULAR | Status: AC
  Administered 2021-12-31: 10 [IU] via INTRAVENOUS
  Filled 2021-12-31: qty 1

## 2021-12-31 MED ORDER — SPIRONOLACTONE 25 MG PO TABS: 25.0000 mg | ORAL_TABLET | Freq: Every day | ORAL | Status: DC

## 2021-12-31 MED ORDER — LACTATED RINGERS IV BOLUS (SEPSIS)
500.0000 mL | Freq: Once | INTRAVENOUS | Status: AC
  Administered 2021-12-31: 500 mL via INTRAVENOUS

## 2021-12-31 MED ORDER — IOHEXOL 350 MG/ML SOLN
75.0000 mL | Freq: Once | INTRAVENOUS | Status: AC | PRN
  Administered 2021-12-31: 75 mL via INTRAVENOUS

## 2021-12-31 MED ORDER — TORSEMIDE 20 MG PO TABS: 40.0000 mg | ORAL_TABLET | Freq: Every day | ORAL | Status: DC

## 2021-12-31 MED ORDER — INSULIN ASPART 100 UNIT/ML IJ SOLN
5.0000 [IU] | Freq: Three times a day (TID) | INTRAMUSCULAR | Status: DC
  Administered 2021-12-31 (×2): 5 [IU] via SUBCUTANEOUS
  Filled 2021-12-31 (×2): qty 1

## 2021-12-31 MED ORDER — INSULIN ASPART 100 UNIT/ML IJ SOLN
5.0000 [IU] | Freq: Once | INTRAMUSCULAR | Status: AC
Start: 2021-12-31 — End: 2021-12-31
  Administered 2021-12-31: 5 [IU] via INTRAVENOUS
  Filled 2021-12-31: qty 1
  Filled 2021-12-31 (×3): qty 0.05

## 2021-12-31 MED ORDER — CARVEDILOL 6.25 MG PO TABS
6.2500 mg | ORAL_TABLET | Freq: Two times a day (BID) | ORAL | Status: DC
Start: 2021-12-31 — End: 2021-12-31

## 2021-12-31 MED ORDER — METHYLPREDNISOLONE SODIUM SUCC 125 MG IJ SOLR
80.0000 mg | Freq: Every day | INTRAMUSCULAR | Status: DC
  Administered 2021-12-31: 80 mg via INTRAVENOUS
  Filled 2021-12-31: qty 2

## 2021-12-31 MED ORDER — ONDANSETRON HCL 4 MG/2ML IJ SOLN: 4.0000 mg | Freq: Four times a day (QID) | INTRAMUSCULAR | Status: DC | PRN

## 2021-12-31 MED ORDER — SODIUM CHLORIDE 0.9 % IV SOLN
2.0000 g | Freq: Once | INTRAVENOUS | Status: AC
  Administered 2021-12-31: 2 g via INTRAVENOUS
  Filled 2021-12-31: qty 12.5

## 2021-12-31 MED ORDER — METRONIDAZOLE 500 MG/100ML IV SOLN
500.0000 mg | Freq: Once | INTRAVENOUS | Status: AC
  Administered 2021-12-31: 500 mg via INTRAVENOUS
  Filled 2021-12-31: qty 100

## 2021-12-31 MED ORDER — INSULIN ASPART 100 UNIT/ML IJ SOLN
0.0000 [IU] | Freq: Every day | INTRAMUSCULAR | Status: DC
  Administered 2021-12-31: 5 [IU] via SUBCUTANEOUS
  Filled 2021-12-31 (×2): qty 1

## 2021-12-31 MED ORDER — VANCOMYCIN HCL IN DEXTROSE 1-5 GM/200ML-% IV SOLN: 1000.0000 mg | Freq: Once | INTRAVENOUS | Status: DC

## 2021-12-31 MED ORDER — ACETAMINOPHEN 325 MG PO TABS
650.0000 mg | ORAL_TABLET | Freq: Four times a day (QID) | ORAL | Status: DC | PRN
  Administered 2022-01-01 – 2022-01-02 (×3): 650 mg via ORAL
  Filled 2021-12-31 (×3): qty 2

## 2021-12-31 MED ORDER — INSULIN REGULAR HUMAN 100 UNIT/ML IJ SOLN
5.0000 [IU] | Freq: Once | INTRAMUSCULAR | Status: DC
  Filled 2021-12-31: qty 3

## 2021-12-31 MED ORDER — POTASSIUM CHLORIDE CRYS ER 20 MEQ PO TBCR
40.0000 meq | EXTENDED_RELEASE_TABLET | Freq: Once | ORAL | Status: AC
  Administered 2021-12-31: 40 meq via ORAL
  Filled 2021-12-31: qty 2

## 2021-12-31 NOTE — H&P (Signed)
?History and Physical  ? ? ?Patient: Cheryl Mercer DTO:[id number] DOB: 11-15-1985 ?DOA: 12/31/2021 ?DOS: the patient was seen and examined on 12/31/2021 ?PCP: Pcp, No  ?Patient coming from: Home ? ?Chief Complaint:  ?Chief Complaint  ?Patient presents with  ? Cough  ?  Per pt, cough for the past 2-3 days, chest hurts when she coughs, have not taken insulin. EMS BS reads high.  ? ? ?HPI: Cheryl Mercer is a 36 y.o. female with medical history significant for Chronic systolic heart failure secondary to dilated cardiomyopathy last EF 30 to 35% 05/2021, insulin-dependent type 2 diabetes, HTN, who presents to the ED with a 2 to 3-day history of cough productive of green phlegm, shortness of breath and chest pain on coughing.  Also admits to not taking her insulin for the past few days.  Has no nausea or vomiting or sick contacts. ?ED course and data review: Tmax 103.4, tachycardic to 115 with respirations 24 O2 sat 99% on room air, BP 149/108, labs with WBC 12,000, lactic acid pending blood glucose 677.  Noted to have elevated AST of 66 and alk phos of 224.  Sodium 127 and potassium 3.3.  Anion gap 11.  Troponin 17, beta hydroxybutyric acid 0.09, Pro-Calc 0.74.  EKG, personally viewed and interpreted with sinus tachycardia at 120 with no acute ST-T wave changes.  Chest x-ray showed mild bibasilar atelectasis.  CTA chest shows the following: ?IMPRESSION: ?1. Again noted prominence of the pulmonary trunk. No embolus through ?the segmental divisions. Subsegmental arteries obscured by breathing ?motion. ?2. Panchamber cardiomegaly with prominent central pulmonary veins. ?3. Small right and trace left pleural effusions with increased lower ?zonal septal thickening and ground-glass/nodular disease, most ?likely due to CHF or fluid overload. ?4. Waxing and waning of chronic nodular disease also seen and may ?suggest changes of atypical pulmonary edema or infectious process. ?Pulmonary medicine consult recommended  unless already done. ?Underlying neoplasm is possible but less likely at this age. ?5. Mildly enlarged mediastinal and hilar nodes. ? ?Patient was started on sepsis fluids as well as cefepime and vancomycin and Flagyl for sepsis of unknown source, also treated with 10 units IV insulin and oral potassium.  Hospitalist consulted for admission.  ? ?Review of Systems: As mentioned in the history of present illness. All other systems reviewed and are negative. ?Past Medical History:  ?Diagnosis Date  ? CHF (congestive heart failure) ([place])   ? Chronic back pain   ? Chronic combined systolic and diastolic congestive heart failure ([place]) 09/07/2019  ? Community acquired pneumonia 12/16/2019  ? Diabetes mellitus without complication ([place])   ? History of COVID-19 11/07/2019  ? Migraines   ? Pregnancy induced hypertension   ? Pulmonary embolus ([place]) 12/16/2019  ? Pulmonary infarct ([place]) 12/17/2019  ? ?Past Surgical History:  ?Procedure Laterality Date  ? CERVICAL BIOPSY  W/ LOOP ELECTRODE EXCISION  2009  ? Laparoscopic tubal sterilization with Falope rings.  2009  ? RIGHT/LEFT HEART CATH AND CORONARY ANGIOGRAPHY N/A 03/25/2019  ? Procedure: RIGHT/LEFT HEART CATH AND CORONARY ANGIOGRAPHY;  Surgeon: Jolaine Artist, MD;  Location: [place] [facility];  Service: Cardiovascular;  Laterality: N/A;  ? TUBAL LIGATION    ? ?Social History:  reports that she has never smoked. She has never used smokeless tobacco. She reports that she does not drink alcohol and does not use drugs. ? ?Allergies  ?Allergen Reactions  ? Ibuprofen Anaphylaxis  ? ? ?Family History  ?Problem Relation Age of Onset  ? Hypertension  Mother   ? Anesthesia problems Neg Hx   ? ? ?Prior to Admission medications   ?Medication Sig Start Date End Date Taking? Authorizing Provider  ?atorvastatin (LIPITOR) 40 MG tablet Take 1 tablet (40 mg total) by mouth daily at 6 PM. 06/15/21   Val Riles, MD  ?blood glucose meter kit and supplies Dispense based on patient and  insurance preference. Check blood sugar 4 times daily, three times before meals and once before bedtime. (FOR ICD-10 E10.9, E11.9). ?Patient not taking: Reported on 09/05/2021 08/30/21   Alisa Graff, FNP  ?carvedilol (COREG) 6.25 MG tablet Take 1 tablet (6.25 mg total) by mouth 2 (two) times daily with a meal. 06/15/21   Val Riles, MD  ?empagliflozin (JARDIANCE) 10 MG TABS tablet Take 1 tablet (10 mg total) by mouth daily before breakfast. 09/05/21   Alisa Graff, FNP  ?gabapentin (NEURONTIN) 300 MG capsule Take 1 capsule (300 mg total) by mouth 3 (three) times daily. ?Patient not taking: Reported on 08/30/2021 07/02/20   Isla Pence, MD  ?insulin aspart (NOVOLOG FLEXPEN) 100 UNIT/ML FlexPen Inject 2-15 Units into the skin 3 (three) times daily after meals. CBG 121 - 150: 2 units, 151 - 200: 3 units, 201 - 250: 5 units, 251 - 300: 8 units, 301 - 350: 11 units, 351 - 400: 15u 06/15/21   Val Riles, MD  ?insulin glargine (LANTUS) 100 UNIT/ML Solostar Pen Inject 40 Units into the skin daily. 06/15/21   Val Riles, MD  ?metFORMIN (GLUCOPHAGE) 500 MG tablet Take 1 tablet (500 mg total) by mouth 2 (two) times daily with a meal. 06/15/21   Val Riles, MD  ?potassium chloride SA (KLOR-CON) 20 MEQ tablet Take 1 tablet (20 mEq total) by mouth daily. 06/15/21   Val Riles, MD  ?sacubitril-valsartan (ENTRESTO) 24-26 MG Take 1 tablet by mouth 2 (two) times daily. 06/15/21   Val Riles, MD  ?spironolactone (ALDACTONE) 25 MG tablet Take 1 tablet (25 mg total) by mouth daily. 06/15/21   Val Riles, MD  ?torsemide (DEMADEX) 20 MG tablet Take 2 tablets (40 mg total) by mouth daily. 09/13/21 12/12/21  Alisa Graff, FNP  ? ? ?Physical Exam: ?Vitals:  ? 12/31/21 0026 12/31/21 0030 12/31/21 0035 12/31/21 0048  ?BP: (!) 149/108     ?Pulse: (!) 115     ?Resp: (!) 24     ?Temp: 99.5 ?F (37.5 ?C)   (!) 103.4 ?F (39.7 ?C)  ?TempSrc: Oral   Rectal  ?SpO2: 99%  99%   ?Weight:  60.7 kg    ?Height:  4' 9"  (1.448 m)     ? ?Physical Exam ?Constitutional:   ?   Appearance: She is ill-appearing.  ?Cardiovascular:  ?   Rate and Rhythm: Tachycardia present.  ?Pulmonary:  ?   Effort: Tachypnea present.  ?   Breath sounds: Rales present.  ? ? ?Results for orders placed or performed during the hospital encounter of 12/31/21 (from the past 24 hour(s))  ?Comprehensive metabolic panel     Status: Abnormal  ? Collection Time: 12/31/21 12:40 AM  ?Result Value Ref Range  ? Sodium 127 (L) 135 - 145 mmol/L  ? Potassium 3.3 (L) 3.5 - 5.1 mmol/L  ? Chloride 87 (L) 98 - 111 mmol/L  ? CO2 29 22 - 32 mmol/L  ? Glucose, Bld 677 (HH) 70 - 99 mg/dL  ? BUN 8 6 - 20 mg/dL  ? Creatinine, Ser 0.73 0.44 - 1.00 mg/dL  ? Calcium 7.5 (L) 8.9 -  10.3 mg/dL  ? Total Protein 6.7 6.5 - 8.1 g/dL  ? Albumin 2.2 (L) 3.5 - 5.0 g/dL  ? AST 66 (H) 15 - 41 U/L  ? ALT 19 0 - 44 U/L  ? Alkaline Phosphatase 224 (H) 38 - 126 U/L  ? Total Bilirubin 1.0 0.3 - 1.2 mg/dL  ? GFR, Estimated >60 >60 mL/min  ? Anion gap 11 5 - 15  ?CBC with Differential     Status: Abnormal  ? Collection Time: 12/31/21 12:40 AM  ?Result Value Ref Range  ? WBC 12.1 (H) 4.0 - 10.5 K/uL  ? RBC 4.05 3.87 - 5.11 MIL/uL  ? Hemoglobin 12.1 12.0 - 15.0 g/dL  ? HCT 35.8 (L) 36.0 - 46.0 %  ? MCV 88.4 80.0 - 100.0 fL  ? MCH 29.9 26.0 - 34.0 pg  ? MCHC 33.8 30.0 - 36.0 g/dL  ? RDW 13.3 11.5 - 15.5 %  ? Platelets 234 150 - 400 K/uL  ? nRBC 0.0 0.0 - 0.2 %  ? Neutrophils Relative % 85 %  ? Neutro Abs 10.3 (H) 1.7 - 7.7 K/uL  ? Lymphocytes Relative 7 %  ? Lymphs Abs 0.8 0.7 - 4.0 K/uL  ? Monocytes Relative 7 %  ? Monocytes Absolute 0.9 0.1 - 1.0 K/uL  ? Eosinophils Relative 0 %  ? Eosinophils Absolute 0.0 0.0 - 0.5 K/uL  ? Basophils Relative 0 %  ? Basophils Absolute 0.1 0.0 - 0.1 K/uL  ? Immature Granulocytes 1 %  ? Abs Immature Granulocytes 0.06 0.00 - 0.07 K/uL  ?Protime-INR     Status: Abnormal  ? Collection Time: 12/31/21 12:40 AM  ?Result Value Ref Range  ? Prothrombin Time 16.0 (H) 11.4 - 15.2 seconds  ? INR  1.3 (H) 0.8 - 1.2  ?APTT     Status: Abnormal  ? Collection Time: 12/31/21 12:40 AM  ?Result Value Ref Range  ? aPTT 42 (H) 24 - 36 seconds  ?Troponin I (High Sensitivity)     Status: None  ? Collection T

## 2021-12-31 NOTE — Assessment & Plan Note (Addendum)
--  much improved BP. D/c steroids and midodrine ?

## 2021-12-31 NOTE — Progress Notes (Signed)
CODE SEPSIS - PHARMACY COMMUNICATION ? ?**Broad Spectrum Antibiotics should be administered within 1 hour of Sepsis diagnosis** ? ?Time Code Sepsis Called/Page Received: 0041 ? ?Antibiotics Ordered: Cefepime and Vancomycin  ? ?Time of 1st antibiotic administration: 0107 ? ?Otelia Sergeant, PharmD, MBA ?12/31/2021 ?12:53 AM ? ?

## 2021-12-31 NOTE — Assessment & Plan Note (Addendum)
-  Started on Semglee insulin 40 units daily and sliding scale insulin plus short acting insulin prior to meals. ?--d/c steroids ?

## 2021-12-31 NOTE — Progress Notes (Signed)
Sepsis tracking by eLINK 

## 2021-12-31 NOTE — ED Notes (Signed)
Pt to be admitted to PCU, 238 report given to [facility] RN, pt transported by ED tech, belongings sent with pt. ?

## 2021-12-31 NOTE — Assessment & Plan Note (Addendum)
--  Management as above.  Last EF on record from October 2022 with EF 30 to 35%.   ?-- Patient follows with [facility] clinic cardiology. Resume Coreg and Jardiance ?--IV lasix today ?--holding enteresto and spironolactone ?

## 2021-12-31 NOTE — Assessment & Plan Note (Addendum)
--   Blood pressure much improved. IV Lasix being given with cardiology. -- Blood pressure remaining stable without midodrine -- held initially--now resumed at d/centeresto, spironolactone and torsemide.  --resumed Coreg and jardiance by cardiology.  --pt advised to comply with meds

## 2021-12-31 NOTE — Progress Notes (Signed)
?Progress Note ? ? ?Patient: Cheryl Mercer NTI:[id number] DOB: [birth year] DOA: 12/31/2021     0 ?DOS: the patient was seen and examined on 12/31/2021 ?  ?Brief hospital course: ?36 year old female with systolic congestive heart failure and dilated cardiomyopathy, type 2 diabetes mellitus and hypertension presented with a 2 to 3-day history of cough with green phlegm with shortness of breath.  She had a fever of 103.4 tachycardic.  She became hypotensive requiring IV fluids and holding her heart failure medications.  She also had a very high sugar in the emergency room.  CT scan of the chest showed waxing and waning chronic nodular disease, enlarged mediastinal and hilar lymph nodes, panchamber cardiomegaly and prominence of the pulmonary trunk.  No pulmonary embolism ? ?Rhinovirus was positive on the culture.  With her hypotension, we started on midodrine and held her systolic heart failure medications.  IV fluids given.  Steroids were also started.  In the afternoon on 12/31/2021 the patient was doing little bit better. ? ?Assessment and Plan: ?* Sepsis (HCC) ?Sepsis criteria includes fever, tachycardia and tachypnea with leukocytosis and suspected respiratory tract infection.  Rhinovirus on respiratory culture.  Steroids started.  With hypotension and fluids were given and antihypertensive medications were held.  Empiric antibiotics for now, likely can discontinue soon if blood cultures remain negative. ? ?CAP (community acquired pneumonia) ?Continue Rocephin and Zithromax but likely rhinovirus related.  Continue steroids.  Nebulizer treatments. ? ?Hypotension ?Started midodrine and steroids this morning.  Blood pressure improved.  We will put holding parameters on midodrine.  Hopefully can get rid of soon. ? ?Chronic systolic CHF (congestive heart failure) (HCC) ?Holding antihypertensive medications with hypotension including all heart failure medications.  Giving IV fluids for right now.  Continue  midodrine for now.  Watch respiratory status closely.  Echocardiogram.  Appreciate cardiology consultation. ? ?Dilated cardiomyopathy (HCC) ?Management as above.  Last EF on record from October 2022 with EF 30 to 35%.  Echocardiogram ordered. ? ?Uncontrolled type 2 diabetes mellitus with hyperglycemia, with long-term current use of insulin (HCC) ?Blood glucose in the 600s with IV insulin given in the ED. with starting steroids, continue to watch sugars closely.  Started on Semglee insulin 40 units daily and sliding scale insulin plus short acting insulin prior to meals. ? ? ? ? ?  ? ?Subjective: Called to see patient urgently this morning with shortness of breath, cough, hypotension.  Patient was found to have rhinovirus on her respiratory panel. ? ?Physical Exam: ?Vitals:  ? 12/31/21 1000 12/31/21 1100 12/31/21 1200 12/31/21 1203  ?BP: 125/89 (!) 115/100 (!) 125/103 (!) 129/105  ?Pulse: (!) 105 (!) 105 (!) 101 (!) 103  ?Resp: (!) 27 14 (!) 21 20  ?Temp:    98.5 ?F (36.9 ?C)  ?TempSrc:    Oral  ?SpO2: 97% 99% 98% 100%  ?Weight:      ?Height:      ? ?Physical Exam ?HENT:  ?   Head: Normocephalic.  ?   Mouth/Throat:  ?   Pharynx: No oropharyngeal exudate.  ?Eyes:  ?   General: Lids are normal.  ?   Conjunctiva/sclera: Conjunctivae normal.  ?Cardiovascular:  ?   Rate and Rhythm: Normal rate and regular rhythm.  ?   Heart sounds: Normal heart sounds, S1 normal and S2 normal.  ?Pulmonary:  ?   Breath sounds: Examination of the right-lower field reveals decreased breath sounds and rhonchi. Examination of the left-lower field reveals decreased breath sounds and rhonchi. Decreased breath  sounds and rhonchi present. No wheezing or rales.  ?Abdominal:  ?   Palpations: Abdomen is soft.  ?   Tenderness: There is no abdominal tenderness.  ?Musculoskeletal:  ?   Right lower leg: No swelling.  ?   Left lower leg: No swelling.  ?Skin: ?   General: Skin is warm.  ?   Findings: No rash.  ?Neurological:  ?   Mental Status: She is  alert.  ?   Comments: This morning she was lethargic.  Doing better in the afternoon.  ?  ?Data Reviewed: ?Respiratory panel positive for rhinovirus ?Glucose 677 on presentation, sodium 127, potassium 3.3, alkaline phosphatase 224 AST 66, BNP elevated 1709.  Lactic acid peaked at 3.4 and down to 2.6.  White blood cell count 14.4, hemoglobin 11.8, procalcitonin 0.54 ? ?Family Communication: Spoke with mother at the bedside ? ?Disposition: ?Status is: Inpatient ?Remains inpatient appropriate because: Required starting steroids and midodrine to maintain blood pressure ? ?Planned Discharge Destination: Home ? ? ?Author: ?Alford Highland, MD ?12/31/2021 1:55 PM ? ?For on call review [email or web address].  ?

## 2021-12-31 NOTE — Hospital Course (Addendum)
36 year old female with systolic congestive heart failure and dilated cardiomyopathy, type 2 diabetes mellitus and hypertension presented with a 2 to 3-day history of cough with green phlegm with shortness of breath.  She had a fever of 103.4 tachycardic.  She became hypotensive requiring IV fluids and holding her heart failure medications.  She also had a very high sugar in the emergency room.  CT scan of the chest showed waxing and waning chronic nodular disease, enlarged mediastinal and hilar lymph nodes, panchamber cardiomegaly and prominence of the pulmonary trunk.  No pulmonary embolism ? ?Rhinovirus was positive on the culture.   ?

## 2021-12-31 NOTE — Progress Notes (Signed)
Pt restless, pale, complaining of 5/10 chest pain, pt stating "something is not right". BP is 87/67, HR 112, T; 97.4, SPO 96% on R/A. Dr. Renae Gloss notified. Per MD will come assess pt. Will continue to monitor. ?

## 2021-12-31 NOTE — Progress Notes (Signed)
PHARMACY -  BRIEF ANTIBIOTIC NOTE  ? ?Pharmacy has received consult(s) for Cefepime and Vancomycin from an ED provider.  The patient's profile has been reviewed for ht/wt/allergies/indication/available labs.   ? ?One time order(s) placed for Cefepime 2 gm and Vancomycin 1500 mg per pt wt: 60.7 kg. ? ?Further antibiotics/pharmacy consults should be ordered by admitting physician if indicated.       ?                ?Thank you, ?Renda Rolls, PharmD, MBA ?12/31/2021 ?12:54 AM ? ?

## 2021-12-31 NOTE — Plan of Care (Signed)

## 2021-12-31 NOTE — Consult Note (Signed)
[place] NOTE       Patient ID: Cheryl Mercer MRN: [id number] DOB/AGE: [birth year] 36 y.o.  Admit date: 12/31/2021 Referring Physician Dr. Loletha Grayer Primary Physician  Primary Cardiologist ?, most recently seen by Darylene Price, NP  Reason for Consultation history of NICM, HFrEF  HPI: Cheryl Mercer is a 76yoF with a PMH of NICM with recent LVEF 30-35%, global hypo, biatrial and LV dilation 05/2021, insulin dependent diabetes, hyperlipidemia, history of PE (not on [facility]), who presented to [facility] ED 12/31/2021 with a chills, productive cough, and elevated blood glucose after being without her insulin for a number of days. She was febrile to 103 with ane elevated lactate to 3.4, worsening leukocytosis, and was initially hypotensive. Respiratory panel positive for rhinovirus. Cardiology is consulted due to her history of NICM.   The patient presents with her mother, she is lying in bed on her right side.  She states that she started having a productive cough 2 to 3 days ago and her ribs started hurting first on the left side underneath her breast than also on the right when she is coughing.  She has some pleuritic chest discomfort but it has improved throughout the morning.  She admits to swelling in her lower extremities and her feet and feels like she has some extra fluid buildup, but denies exertional chest discomfort, palpitations, dizziness, presyncope.  She says she has not been seen by a cardiologist on an outpatient basis in some time, but is aware she needs to and her mother nods in agreement.  She said she was doing fairly well from a cardiac standpoint with weighing daily until about 2 months ago where she went to see Darylene Price to get some extra IV Lasix due to some fluid buildup.  She has not been followed by her recently because she "just got tired of doing it" and is also not been weighing daily.  She is a former tobacco smoker, usually drinks 24  ounces of beer some weekends and denies illicit drug use.  She became somewhat hypotensive early this morning with blood pressure 86/67, improved to 120s over 90 throughout the morning.  She is not requiring supplemental oxygen, tachycardic to the 100s to low 110s.  She was febrile to 103 overnight, improved this morning.  Labs on admission are notable for a sodium of 127, potassium 3.3, magnesium 1.8 chloride 87, blood glucose 677, BUN/creatinine 8/0.73, EGFR greater than 60.  Alk phos on admission elevated to 224.  BNP elevated to 1700.  High-sensitivity troponin negative at 17-15.  Lactate downtrending 3.4-2.6.  Most recent procalcitonin 0.54.  WBCs 14,000 H&H 11.8/34.7.  Respiratory panel positive for rhinovirus/enterovirus.  CT angio negative for pulmonary emboli with small right and trace left pleural effusions.  Chest x-ray without confluent infiltrate or effusion.  Review of systems complete and found to be negative unless listed above     Past Medical History:  Diagnosis Date   CHF (congestive heart failure) (HCC)    Chronic back pain    Chronic combined systolic and diastolic congestive heart failure ([place]) 09/07/2019   Community acquired pneumonia 12/16/2019   Diabetes mellitus without complication ([place])    History of COVID-19 11/07/2019   Migraines    Pregnancy induced hypertension    Pulmonary embolus ([place]) 12/16/2019   Pulmonary infarct ([place]) 12/17/2019    Past Surgical History:  Procedure Laterality Date   CERVICAL BIOPSY  W/ LOOP ELECTRODE EXCISION  2009   Laparoscopic tubal sterilization  with Falope rings.  2009   RIGHT/LEFT HEART CATH AND CORONARY ANGIOGRAPHY N/A 03/25/2019   Procedure: RIGHT/LEFT HEART CATH AND CORONARY ANGIOGRAPHY;  Surgeon: Jolaine Artist, MD;  Location: [place] [facility];  Service: Cardiovascular;  Laterality: N/A;   TUBAL LIGATION      (Not in a hospital admission)  Social History   Socioeconomic History   Marital status: Married     Spouse name: Not on file   Number of children: 4   Years of education: Not on file   Highest education level: Not on file  Occupational History   Not on file  Tobacco Use   Smoking status: Never   Smokeless tobacco: Never  Vaping Use   Vaping Use: Never used  Substance and Sexual Activity   Alcohol use: No    Alcohol/week: 6.0 standard drinks    Types: 6 Shots of liquor per week   Drug use: No    Types: Marijuana   Sexual activity: Not Currently    Birth control/protection: Surgical    Comment: band placed around tubes in 2009 surgically by Dr. Glo Herring  Other Topics Concern   Not on file  Social History Narrative   Not on file   Social Determinants of Health   Financial Resource Strain: Not on file  Food Insecurity: Not on file  Transportation Needs: Not on file  Physical Activity: Not on file  Stress: Not on file  Social Connections: Not on file  Intimate Partner Violence: Not on file    Family History  Problem Relation Age of Onset   Hypertension Mother    Anesthesia problems Neg Hx       PHYSICAL EXAM General: Ill-appearing Hispanic female,  in no acute distress.  Lying on her right side in ED stretcher with her mother at bedside HEENT:  Normocephalic and atraumatic. Neck:  No JVD.  Lungs: Normal respiratory effort on room air.  Crackles in right base.   Heart:  tachy RRR . Normal S1 and S2 without gallops or murmurs. Radial & DP pulses 2+ bilaterally. Abdomen: Non-distended appearing.  Msk: Normal strength and tone for age. Extremities: Warm and well perfused. No clubbing, cyanosis.  Trace bilateral lower extremity edema.  Neuro: Alert and oriented X 3. Psych:  Answers questions appropriately.   Labs:   Lab Results  Component Value Date   WBC 14.4 (H) 12/31/2021   HGB 11.8 (L) 12/31/2021   HCT 34.7 (L) 12/31/2021   MCV 87.4 12/31/2021   PLT 262 12/31/2021    Recent Labs  Lab 12/31/21 0040 12/31/21 0550  NA 127*  --   K 3.3*  --   CL 87*  --    CO2 29  --   BUN 8  --   CREATININE 0.73 0.59  CALCIUM 7.5*  --   PROT 6.7  --   BILITOT 1.0  --   ALKPHOS 224*  --   ALT 19  --   AST 66*  --   GLUCOSE 677*  --    Lab Results  Component Value Date   CKTOTAL 116 12/16/2019    Lab Results  Component Value Date   CHOL 135 07/10/2020   CHOL 151 03/24/2019   Lab Results  Component Value Date   HDL 40 (L) 07/10/2020   HDL 38 (L) 03/24/2019   Lab Results  Component Value Date   LDLCALC 75 07/10/2020   [place] 95 03/24/2019   Lab Results  Component Value Date  TRIG 100 07/10/2020   TRIG 92 03/24/2019   Lab Results  Component Value Date   CHOLHDL 3.4 07/10/2020   CHOLHDL 4.0 03/24/2019   No results found for: LDLDIRECT    Radiology: CT Angio Chest PE W and/or Wo Contrast  Result Date: 12/31/2021 CLINICAL DATA:  Suspected pulmonary embolism. D-dimer unknown at this time. EXAM: CT ANGIOGRAPHY CHEST WITH CONTRAST TECHNIQUE: Multidetector CT imaging of the chest was performed using the standard protocol during bolus administration of intravenous contrast. Multiplanar CT image reconstructions and MIPs were obtained to evaluate the vascular anatomy. RADIATION DOSE REDUCTION: This exam was performed according to the departmental dose-optimization program which includes automated exposure control, adjustment of the mA and/or kV according to patient size and/or use of iterative reconstruction technique. CONTRAST:  16mL OMNIPAQUE IOHEXOL 350 MG/ML SOLN COMPARISON:  Portable chest today, CTA chest 06/14/2021, CTA chest 07/02/2020. FINDINGS: Cardiovascular: There is moderate pan chamber cardiomegaly. There is a prominent pulmonary trunk 3.4 cm indicating arterial hypertension. No arterial filling defects are seen to the segmental level but the subsegmental arterial bed is obscured by breathing motion. There is no aortic aneurysm or visible atherosclerosis or dissection. The great vessels are patent. Central pulmonary veins are prominent  but no more than previously. No coronary artery calcification is seen. Mediastinum/Nodes: There are mildly prominent mediastinal and hilar lymph nodes with multiple mediastinal locations involved and was seen previously including left-sided prevascular nodes of 1.1 cm in short axis right paratracheal nodes up to 1.2 cm in short axis with a similar sized bilateral hilar nodes, and subcarinal lymph nodes up to 1.7 cm short axis. The lymphadenopathy is unchanged compared with 06/14/2021 but the lymph nodes are few mm larger than in 2021. Visualized thyroid and axillary spaces are unremarkable. There is no new intrathoracic adenopathy. Thoracic esophagus and trachea show no focal abnormality. Lungs/Pleura: There is interval development of a small right and trace layering left pleural effusions. There is no pneumothorax. There is worsening interstitial septal thickening in the right-greater-than-left lower lung fields, increasing associated ground-glass and small nodular opacities in both lower and right middle lobes. In the upper lobes there were previously numerous nodules which are much decreased in numbers and with visible pre-existing nodules decreased in size in the interval. There is a new nodule in the lateral base of the right upper lobe measuring 9 mm. Upper Abdomen: No acute abnormality. Musculoskeletal: No acute or significant osseous findings. Review of the MIP images confirms the above findings. IMPRESSION: 1. Again noted prominence of the pulmonary trunk. No embolus through the segmental divisions. Subsegmental arteries obscured by breathing motion. 2. Panchamber cardiomegaly with prominent central pulmonary veins. 3. Small right and trace left pleural effusions with increased lower zonal septal thickening and ground-glass/nodular disease, most likely due to CHF or fluid overload. 4. Waxing and waning of chronic nodular disease also seen and may suggest changes of atypical pulmonary edema or infectious  process. Pulmonary medicine consult recommended unless already done. Underlying neoplasm is possible but less likely at this age. 5. Mildly enlarged mediastinal and hilar nodes. Electronically Signed   By: Telford Nab M.D.   On: 12/31/2021 03:33   DG Chest Port 1 View  Result Date: 12/31/2021 CLINICAL DATA:  Possible sepsis EXAM: PORTABLE CHEST 1 VIEW COMPARISON:  None Available. FINDINGS: Cardiac shadow is within normal limits. The lungs are well aerated bilaterally. Mild bibasilar atelectasis is seen. No focal confluent infiltrate or effusion is noted. No bony abnormality is seen. IMPRESSION: Mild bibasilar atelectasis.  Some Electronically Signed   By: Inez Catalina M.D.   On: 12/31/2021 01:06    03/25/2019 Right and Left heart cath Findings:   Ao = 122/88 (104) LV = 125/15 RA = 2 RV = 35/6 PA = 37/14 (25) PCW = 18 Fick cardiac output/index = 5.3/3.2 SVR = 1543 PVR = 1.3 WU Ao sat = 99% PA sat = 74%, 76%   Assessment: 1. Normal coronary arteries 2. 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 Check cMRI.  Glori Bickers, MD   ECHO 06/15/2021  1. Left ventricular ejection fraction, by estimation, is 30 to 35%. The  left ventricle has moderately decreased function. The left ventricle  demonstrates global hypokinesis. The left ventricular internal cavity size  was mildly dilated. Left ventricular  diastolic parameters were normal.   2. Right ventricular systolic function is normal. The right ventricular  size is normal.   3. Left atrial size was moderately dilated.   4. Right atrial size was mildly dilated.   5. The mitral valve is normal in structure. Mild mitral valve  regurgitation.   6. The aortic valve is normal in structure. Aortic valve regurgitation is  not visualized.   TELEMETRY reviewed by me: Sinus tachycardia rate low 110s.  EKG reviewed by me: sinus tachycardia rate 116,  LVH, similar ST - T wave segments to prior   ASSESSMENT AND PLAN:  Cheryl Mercer is a 54yoF with a PMH of NICM with recent LVEF 30-35%, global hypo, biatrial and LV dilation 05/2021, insulin dependent diabetes, hyperlipidemia, history of PE (not on [facility]), who presented to [facility] ED 12/31/2021 with a chills, productive cough, and elevated blood glucose after being without her insulin for a number of days. She was febrile to 103 with ane elevated lactate to 3.4, worsening leukocytosis, and was initially hypotensive. Respiratory panel positive for rhinovirus. Cardiology is consulted due to her history of NICM.   #Sepsis 2/2 rhinovirus infection #Acute on chronic HFrEF (LVEF 30-35% 05/2021) #Type 1 diabetes The patient presents with a 3-day history of productive cough, was found to be febrile with elevated lactate, leukocytosis, and met sepsis criteria on admission.  Her respiratory panel is positive for rhinovirus and at my time of evaluation this afternoon she has a productive cough and is overall feeling unwell, but somewhat better than the morning of admission.  Troponins negative, BNP elevated to 1700, and she appears somewhat volume overloaded appearing in her lower extremities, likely driven by her viral infection.  She saw Darylene Price with the heart failure clinic for a few visits in January, but otherwise has not been followed regularly by cardiology on an outpatient basis, due to some noncompliance and frustrations with her chronic condition it seems. -Supportive care for her infection and management of diabetes as per primary team -Wean midodrine as tolerated -S/p 40 mg of IV Lasix x1, further dosing was held due to hypotension, likely restart IV dosing tomorrow morning -Continue GDMT with Jardiance -Restart home carvedilol 6.25 twice daily, spironolactone 25 mg daily as her blood pressure allows -Hold home torsemide 40 mg with anticipated IV diuretics as above -Continue atorvastatin 40 mg  daily -Repeat echocardiogram complete -Strict I's/O, daily weights -Encouraged outpatient cardiology follow-up after discharge. -We will continue to follow  This patient's plan of care was discussed and created with Dr. Clayborn Bigness and he is in agreement.  Signed: Tristan Schroeder , PA-C 12/31/2021, 12:03 PM [facility] Cardiology

## 2021-12-31 NOTE — ED Provider Notes (Addendum)
? ?[facility] ?Provider Note ? ? ? Event Date/Time  ? First MD Initiated Contact with Patient 12/31/21 0026   ?  (approximate) ? ? ?History  ? ?Cough (Per pt, cough for the past 2-3 days, chest hurts when she coughs, have not taken insulin. EMS BS reads high.) ? ? ?HPI ? ?Cheryl Mercer is a 36 y.o. female with history of CHF, insulin-dependent diabetes, PE not currently on anticoagulation, hyperlipidemia who presents to the emergency department with her mother for complaints of chills, productive cough with green sputum production, chest and back pain from coughing frequently for the past several days.  She has also been without her insulin.  Blood sugar with EMS was high.  She denies abdominal pain, vomiting or diarrhea.  No known sick contacts.  States she has not had any lower extremity swelling or pain.  States this does not feel similar to her CHF exacerbation or PE. ? ? ? ?History provided by patient, mother, EMS. ? ? ? ?Past Medical History:  ?Diagnosis Date  ? CHF (congestive heart failure) ([place])   ? Chronic back pain   ? Chronic combined systolic and diastolic congestive heart failure ([place]) 09/07/2019  ? Community acquired pneumonia 12/16/2019  ? Diabetes mellitus without complication ([place])   ? History of COVID-19 11/07/2019  ? Migraines   ? Pregnancy induced hypertension   ? Pulmonary embolus ([place]) 12/16/2019  ? Pulmonary infarct ([place]) 12/17/2019  ? ? ?Past Surgical History:  ?Procedure Laterality Date  ? CERVICAL BIOPSY  W/ LOOP ELECTRODE EXCISION  2009  ? Laparoscopic tubal sterilization with Falope rings.  2009  ? RIGHT/LEFT HEART CATH AND CORONARY ANGIOGRAPHY N/A 03/25/2019  ? Procedure: RIGHT/LEFT HEART CATH AND CORONARY ANGIOGRAPHY;  Surgeon: Jolaine Artist, MD;  Location: [place] [facility];  Service: Cardiovascular;  Laterality: N/A;  ? TUBAL LIGATION    ? ? ?MEDICATIONS:  ?Prior to Admission medications   ?Medication Sig Start Date End Date Taking? Authorizing  Provider  ?atorvastatin (LIPITOR) 40 MG tablet Take 1 tablet (40 mg total) by mouth daily at 6 PM. 06/15/21   Val Riles, MD  ?blood glucose meter kit and supplies Dispense based on patient and insurance preference. Check blood sugar 4 times daily, three times before meals and once before bedtime. (FOR ICD-10 E10.9, E11.9). ?Patient not taking: Reported on 09/05/2021 08/30/21   Alisa Graff, FNP  ?carvedilol (COREG) 6.25 MG tablet Take 1 tablet (6.25 mg total) by mouth 2 (two) times daily with a meal. 06/15/21   Val Riles, MD  ?empagliflozin (JARDIANCE) 10 MG TABS tablet Take 1 tablet (10 mg total) by mouth daily before breakfast. 09/05/21   Alisa Graff, FNP  ?gabapentin (NEURONTIN) 300 MG capsule Take 1 capsule (300 mg total) by mouth 3 (three) times daily. ?Patient not taking: Reported on 08/30/2021 07/02/20   Isla Pence, MD  ?insulin aspart (NOVOLOG FLEXPEN) 100 UNIT/ML FlexPen Inject 2-15 Units into the skin 3 (three) times daily after meals. CBG 121 - 150: 2 units, 151 - 200: 3 units, 201 - 250: 5 units, 251 - 300: 8 units, 301 - 350: 11 units, 351 - 400: 15u 06/15/21   Val Riles, MD  ?insulin glargine (LANTUS) 100 UNIT/ML Solostar Pen Inject 40 Units into the skin daily. 06/15/21   Val Riles, MD  ?metFORMIN (GLUCOPHAGE) 500 MG tablet Take 1 tablet (500 mg total) by mouth 2 (two) times daily with a meal. 06/15/21   Val Riles, MD  ?potassium  chloride SA (KLOR-CON) 20 MEQ tablet Take 1 tablet (20 mEq total) by mouth daily. 06/15/21   Val Riles, MD  ?sacubitril-valsartan (ENTRESTO) 24-26 MG Take 1 tablet by mouth 2 (two) times daily. 06/15/21   Val Riles, MD  ?spironolactone (ALDACTONE) 25 MG tablet Take 1 tablet (25 mg total) by mouth daily. 06/15/21   Val Riles, MD  ?torsemide (DEMADEX) 20 MG tablet Take 2 tablets (40 mg total) by mouth daily. 09/13/21 12/12/21  Alisa Graff, FNP  ? ? ?Physical Exam  ? ?Triage Vital Signs: ?ED Triage Vitals  ?Enc Vitals Group  ?   BP  12/31/21 0026 (!) 149/108  ?   Pulse Rate 12/31/21 0026 (!) 115  ?   Resp 12/31/21 0026 (!) 24  ?   Temp 12/31/21 0026 99.5 ?F (37.5 ?C)  ?   Temp Source 12/31/21 0026 Oral  ?   SpO2 12/31/21 0026 99 %  ?   Weight 12/31/21 0030 133 lb 13.1 oz (60.7 kg)  ?   Height 12/31/21 0030 4' 9" (1.448 m)  ?   Head Circumference --   ?   Peak Flow --   ?   Pain Score 12/31/21 0027 8  ?   Pain Loc --   ?   Pain Edu? --   ?   Excl. in [place]? --   ? ? ?Most recent vital signs: ?Vitals:  ? 12/31/21 0740 12/31/21 0750  ?BP: 96/66 100/76  ?Pulse:    ?Resp: (!) 27 (!) 22  ?Temp:    ?SpO2:  92%  ? ? ?CONSTITUTIONAL: Alert and oriented and responds appropriately to questions.  Chronically ill-appearing, appears uncomfortable ?HEAD: Normocephalic, atraumatic ?EYES: Conjunctivae clear, pupils appear equal, sclera nonicteric ?ENT: normal nose; moist mucous membranes ?NECK: Supple, normal ROM ?CARD: Regular and tachycardic; S1 and S2 appreciated; no murmurs, no clicks, no rubs, no gallops ?RESP: Patient is tachypneic and coughing, breath sounds clear and equal bilaterally; no wheezes, no rhonchi, no rales, no hypoxia or respiratory distress, speaking full sentences ?ABD/GI: Normal bowel sounds; non-distended; soft, non-tender, no rebound, no guarding, no peritoneal signs ?BACK: The back appears normal ?EXT: Normal ROM in all joints; no deformity noted, no edema; no cyanosis, no calf tenderness or calf swelling ?SKIN: Normal color for age and race; warm; no rash on exposed skin ?NEURO: Moves all extremities equally, normal speech ?PSYCH: The patient's mood and manner are appropriate. ? ? ?ED Results / Procedures / Treatments  ? ?LABS: ?(all labs ordered are listed, but only abnormal results are displayed) ?Labs Reviewed  ?LACTIC ACID, PLASMA - Abnormal; Notable for the following components:  ?    Result Value  ? Lactic Acid, Venous 3.4 (*)   ? All other components within normal limits  ?COMPREHENSIVE METABOLIC PANEL - Abnormal; Notable for the  following components:  ? Sodium 127 (*)   ? Potassium 3.3 (*)   ? Chloride 87 (*)   ? Glucose, Bld 677 (*)   ? Calcium 7.5 (*)   ? Albumin 2.2 (*)   ? AST 66 (*)   ? Alkaline Phosphatase 224 (*)   ? All other components within normal limits  ?CBC WITH DIFFERENTIAL/PLATELET - Abnormal; Notable for the following components:  ? WBC 12.1 (*)   ? HCT 35.8 (*)   ? Neutro Abs 10.3 (*)   ? All other components within normal limits  ?PROTIME-INR - Abnormal; Notable for the following components:  ? Prothrombin Time 16.0 (*)   ? INR  1.3 (*)   ? All other components within normal limits  ?APTT - Abnormal; Notable for the following components:  ? aPTT 42 (*)   ? All other components within normal limits  ?URINALYSIS, COMPLETE (UACMP) WITH MICROSCOPIC - Abnormal; Notable for the following components:  ? Color, Urine YELLOW (*)   ? APPearance HAZY (*)   ? Specific Gravity, Urine 1.031 (*)   ? Glucose, UA >=500 (*)   ? Protein, ur 100 (*)   ? Bacteria, UA RARE (*)   ? All other components within normal limits  ?BRAIN NATRIURETIC PEPTIDE - Abnormal; Notable for the following components:  ? B Natriuretic Peptide 1,709.8 (*)   ? All other components within normal limits  ?LACTIC ACID, PLASMA - Abnormal; Notable for the following components:  ? Lactic Acid, Venous 3.0 (*)   ? All other components within normal limits  ?PROTIME-INR - Abnormal; Notable for the following components:  ? Prothrombin Time 16.9 (*)   ? INR 1.4 (*)   ? All other components within normal limits  ?CBC - Abnormal; Notable for the following components:  ? WBC 14.4 (*)   ? Hemoglobin 11.8 (*)   ? HCT 34.7 (*)   ? All other components within normal limits  ?CBG MONITORING, ED - Abnormal; Notable for the following components:  ? Glucose-Capillary 583 (*)   ? All other components within normal limits  ?CBG MONITORING, ED - Abnormal; Notable for the following components:  ? Glucose-Capillary 403 (*)   ? All other components within normal limits  ?CBG MONITORING, ED -  Abnormal; Notable for the following components:  ? Glucose-Capillary 241 (*)   ? All other components within normal limits  ?CBG MONITORING, ED - Abnormal; Notable for the following components:  ? Glucose-Cap

## 2021-12-31 NOTE — Assessment & Plan Note (Addendum)
--   Pneumonia appears viral. Just empirically covering with oral antibiotic. Continue nebulizers or inhalers. ?-- No wheezing no respiratory distress. Discontinue steroids. ?

## 2021-12-31 NOTE — Assessment & Plan Note (Addendum)
--  Sepsis criteria includes fever, tachycardia and tachypnea with leukocytosis and suspected respiratory tract infection.  --Rhinovirus on respiratory culture.   ?--With hypotension and fluids were given and antihypertensive medications were held.  ?-- Empiric antibiotics for now--change to po azithromycin given chronic lung changes ?-- no indication for steroids. Patient not wheezing. Sats stable on room air. ?-- Sepsis resolved. ?

## 2021-12-31 NOTE — Assessment & Plan Note (Deleted)
Continue carvedilol 

## 2022-01-01 ENCOUNTER — Ambulatory Visit: Payer: Medicare Other | Admitting: Family

## 2022-01-01 ENCOUNTER — Inpatient Hospital Stay
Admit: 2022-01-01 | Discharge: 2022-01-01 | Disposition: A | Discharge status: Home / Self Care | Payer: Medicare Other | Attending: Internal Medicine | Admitting: Internal Medicine

## 2022-01-01 DIAGNOSIS — I420 Dilated cardiomyopathy: Secondary | ICD-10-CM | POA: Diagnosis not present

## 2022-01-01 DIAGNOSIS — I509 Heart failure, unspecified: Secondary | ICD-10-CM | POA: Diagnosis not present

## 2022-01-01 DIAGNOSIS — B348 Other viral infections of unspecified site: Secondary | ICD-10-CM | POA: Diagnosis not present

## 2022-01-01 DIAGNOSIS — A419 Sepsis, unspecified organism: Secondary | ICD-10-CM | POA: Diagnosis not present

## 2022-01-01 LAB — ECHOCARDIOGRAM COMPLETE
AR max vel: 2.24 cm2
AV Area VTI: 2.17 cm2
AV Area mean vel: 2.11 cm2
AV Mean grad: 3 mmHg
AV Peak grad: 4.5 mmHg
Ao pk vel: 1.06 m/s
Area-P 1/2: 5.88 cm2
Calc EF: 34.3 %
Height: 57 in
MV VTI: 1.49 cm2
S' Lateral: 5 cm
Single Plane A2C EF: 32.6 %
Single Plane A4C EF: 36.2 %
Weight: 2141.11 oz

## 2022-01-01 LAB — GLUCOSE, CAPILLARY
Glucose-Capillary: 283 mg/dL — ABNORMAL HIGH (ref 70–99)
Glucose-Capillary: 328 mg/dL — ABNORMAL HIGH (ref 70–99)
Glucose-Capillary: 150 mg/dL — ABNORMAL HIGH (ref 70–99)
Glucose-Capillary: 178 mg/dL — ABNORMAL HIGH (ref 70–99)

## 2022-01-01 LAB — HEMOGLOBIN A1C
Hgb A1c MFr Bld: 15.1 % — ABNORMAL HIGH (ref 4.8–5.6)
Mean Plasma Glucose: 387 mg/dL

## 2022-01-01 MED ORDER — CARVEDILOL 3.125 MG PO TABS
3.1250 mg | ORAL_TABLET | Freq: Two times a day (BID) | ORAL | Status: DC
  Administered 2022-01-01: 3.125 mg via ORAL
  Filled 2022-01-01: qty 1

## 2022-01-01 MED ORDER — LIDOCAINE 5 % EX PTCH
1.0000 | MEDICATED_PATCH | CUTANEOUS | Status: DC
  Administered 2022-01-02: 1 via TRANSDERMAL
  Filled 2022-01-01: qty 1

## 2022-01-01 MED ORDER — AZITHROMYCIN 250 MG PO TABS
250.0000 mg | ORAL_TABLET | Freq: Every day | ORAL | Status: DC
  Administered 2022-01-02: 250 mg via ORAL
  Filled 2022-01-01: qty 1

## 2022-01-01 MED ORDER — AZITHROMYCIN 250 MG PO TABS: 250.0000 mg | ORAL_TABLET | Freq: Every day | ORAL | Status: DC

## 2022-01-01 MED ORDER — FUROSEMIDE 10 MG/ML IJ SOLN
40.0000 mg | Freq: Two times a day (BID) | INTRAMUSCULAR | Status: DC
  Administered 2022-01-01 – 2022-01-02 (×2): 40 mg via INTRAVENOUS
  Filled 2022-01-01 (×2): qty 4

## 2022-01-01 NOTE — Progress Notes (Signed)
?[place] CARDIOLOGY CONSULT NOTE  ? ?    ?Patient ID: ?Cheryl Mercer ?MRN: [id number] ?DOB/AGE: 1986/03/06 36 y.o. ? ?Admit date: 12/31/2021 ?Referring Physician Dr. Loletha Grayer ?Primary Physician  ?Primary Cardiologist ?, most recently seen by Darylene Price, NP  ?Reason for Consultation history of NICM, HFrEF ? ?HPI: Cheryl Mercer is a 85yoF with a PMH of NICM with recent LVEF 30-35%, global hypo, biatrial and LV dilation 05/2021, insulin dependent diabetes, hyperlipidemia, history of PE (not on [facility]), who presented to [facility] ED 12/31/2021 with a chills, productive cough, and elevated blood glucose after being without her insulin for a number of days. She was febrile to 103 with ane elevated lactate to 3.4, worsening leukocytosis, and was initially hypotensive. Respiratory panel positive for rhinovirus. Cardiology is consulted due to her history of NICM.  ? ?Interval History: ?- overall clinical improvement, BP maintained off midodrine ?- still with some productive cough, can feel some extra fluid on her lungs and legs (received 2L at least yesterday)  ?- no chest pain, palpitations, fever ?- main complaint is the warm temperature in her hospital room  ?-echo performed this morning ? ?Review of systems complete and found to be negative unless listed above  ? ? ? ?Past Medical History:  ?Diagnosis Date  ? CHF (congestive heart failure) ([place])   ? Chronic back pain   ? Chronic combined systolic and diastolic congestive heart failure ([place]) 09/07/2019  ? Community acquired pneumonia 12/16/2019  ? Diabetes mellitus without complication ([place])   ? History of COVID-19 11/07/2019  ? Migraines   ? Pregnancy induced hypertension   ? Pulmonary embolus ([place]) 12/16/2019  ? Pulmonary infarct ([place]) 12/17/2019  ?  ?Past Surgical History:  ?Procedure Laterality Date  ? CERVICAL BIOPSY  W/ LOOP ELECTRODE EXCISION  2009  ? Laparoscopic tubal sterilization with [place] rings.  2009  ? RIGHT/LEFT HEART CATH AND  CORONARY ANGIOGRAPHY N/A 03/25/2019  ? Procedure: RIGHT/LEFT HEART CATH AND CORONARY ANGIOGRAPHY;  Surgeon: Jolaine Artist, MD;  Location: [facility] [facility];  Service: Cardiovascular;  Laterality: N/A;  ? TUBAL LIGATION    ?  ?Medications Prior to Admission  ?Medication Sig Dispense Refill Last Dose  ? atorvastatin (LIPITOR) 40 MG tablet Take 1 tablet (40 mg total) by mouth daily at 6 PM. 30 tablet 0 12/26/2021 at 1800  ? carvedilol (COREG) 6.25 MG tablet Take 1 tablet (6.25 mg total) by mouth 2 (two) times daily with a meal. 60 tablet 1 12/26/2021 at 0800  ? empagliflozin (JARDIANCE) 10 MG TABS tablet Take 1 tablet (10 mg total) by mouth daily before breakfast. 30 tablet 5 12/26/2021 at 0800  ? insulin aspart (NOVOLOG FLEXPEN) 100 UNIT/ML FlexPen Inject 2-15 Units into the skin 3 (three) times daily after meals. CBG 121 - 150: 2 units, 151 - 200: 3 units, 201 - 250: 5 units, 251 - 300: 8 units, 301 - 350: 11 units, 351 - 400: 15u 15 mL 3 12/26/2021 at 1200  ? insulin glargine (LANTUS) 100 UNIT/ML Solostar Pen Inject 40 Units into the skin daily. 15 mL 0 12/26/2021 at 1200  ? Naproxen Sodium (ALEVE) 220 MG CAPS Take 220 mg by mouth 2 (two) times daily as needed.   prn at prn  ? potassium chloride SA (KLOR-CON) 20 MEQ tablet Take 1 tablet (20 mEq total) by mouth daily. 30 tablet 0 12/26/2021 at 0800  ? sacubitril-valsartan (ENTRESTO) 24-26 MG Take 1 tablet by mouth 2 (two) times daily. 60 tablet 11  12/26/2021 at 1800  ? blood glucose meter kit and supplies Dispense based on patient and insurance preference. Check blood sugar 4 times daily, three times before meals and once before bedtime. (FOR ICD-10 E10.9, E11.9). (Patient not taking: Reported on 09/05/2021) 1 each 0   ? gabapentin (NEURONTIN) 300 MG capsule Take 1 capsule (300 mg total) by mouth 3 (three) times daily. (Patient not taking: Reported on 08/30/2021) 90 capsule 0 Not Taking  ? metFORMIN (GLUCOPHAGE) 500 MG tablet Take 1 tablet (500 mg total) by mouth 2 (two)  times daily with a meal. 60 tablet 0 12/26/2021 at 1800  ? spironolactone (ALDACTONE) 25 MG tablet Take 1 tablet (25 mg total) by mouth daily. 30 tablet 1 12/26/2021 at 0800  ? torsemide (DEMADEX) 20 MG tablet Take 2 tablets (40 mg total) by mouth daily. 60 tablet 5 12/26/2021 at 0800  ? ? ?Social History  ? ?Socioeconomic History  ? Marital status: Married  ?  Spouse name: Not on file  ? Number of children: 4  ? Years of education: Not on file  ? Highest education level: Not on file  ?Occupational History  ? Not on file  ?Tobacco Use  ? Smoking status: Never  ? Smokeless tobacco: Never  ?Vaping Use  ? Vaping Use: Never used  ?Substance and Sexual Activity  ? Alcohol use: No  ?  Alcohol/week: 6.0 standard drinks  ?  Types: 6 Shots of liquor per week  ? Drug use: No  ?  Types: Marijuana  ? Sexual activity: Not Currently  ?  Birth control/protection: Surgical  ?  Comment: band placed around tubes in 2009 surgically by Dr. Glo Herring  ?Other Topics Concern  ? Not on file  ?Social History Narrative  ? Not on file  ? ?Social Determinants of Health  ? ?Financial Resource Strain: Not on file  ?Food Insecurity: Not on file  ?Transportation Needs: Not on file  ?Physical Activity: Not on file  ?Stress: Not on file  ?Social Connections: Not on file  ?Intimate Partner Violence: Not on file  ?  ?Family History  ?Problem Relation Age of Onset  ? Hypertension Mother   ? Anesthesia problems Neg Hx   ?  ? ? ?PHYSICAL EXAM ?General: Ill-appearing Hispanic female,  in no acute distress.  Laying flat in hospital bed, female visitor sleeping in [place] ?HEENT:  Normocephalic and atraumatic. ?Neck:  No JVD.  ?Lungs: Normal respiratory effort on room air.  Bibasilar crackles ?Heart:  tachy RRR . Normal S1 and S2 without gallops or murmurs. Radial & DP pulses 2+ bilaterally. ?Abdomen: Non-distended appearing.  ?Msk: Normal strength and tone for age. ?Extremities: Warm and well perfused. No clubbing, cyanosis.  Trace bilateral lower extremity  edema.  ?Neuro: Alert and oriented X 3. ?Psych:  Answers questions appropriately.  ? ?Labs: ?  ?Lab Results  ?Component Value Date  ? WBC 14.4 (H) 12/31/2021  ? HGB 11.8 (L) 12/31/2021  ? HCT 34.7 (L) 12/31/2021  ? MCV 87.4 12/31/2021  ? PLT 262 12/31/2021  ?  ?Recent Labs  ?Lab 12/31/21 ?0040 12/31/21 ?9702  ?NA 127*  --   ?K 3.3*  --   ?CL 87*  --   ?CO2 29  --   ?BUN 8  --   ?CREATININE 0.73 0.59  ?CALCIUM 7.5*  --   ?PROT 6.7  --   ?BILITOT 1.0  --   ?ALKPHOS 224*  --   ?ALT 19  --   ?AST 66*  --   ?  GLUCOSE 677*  --   ? ? ?Lab Results  ?Component Value Date  ? CKTOTAL 116 12/16/2019  ? ?  ?Lab Results  ?Component Value Date  ? CHOL 135 07/10/2020  ? CHOL 151 03/24/2019  ? ?Lab Results  ?Component Value Date  ? HDL 40 (L) 07/10/2020  ? HDL 38 (L) 03/24/2019  ? ?Lab Results  ?Component Value Date  ? [place] 75 07/10/2020  ? [place] 95 03/24/2019  ? ?Lab Results  ?Component Value Date  ? TRIG 100 07/10/2020  ? TRIG 92 03/24/2019  ? ?Lab Results  ?Component Value Date  ? CHOLHDL 3.4 07/10/2020  ? CHOLHDL 4.0 03/24/2019  ? ?No results found for: LDLDIRECT  ?  ?Radiology: CT Angio Chest PE W and/or Wo Contrast ? ?Result Date: 12/31/2021 ?CLINICAL DATA:  Suspected pulmonary embolism. D-dimer unknown at this time. EXAM: CT ANGIOGRAPHY CHEST WITH CONTRAST TECHNIQUE: Multidetector CT imaging of the chest was performed using the standard protocol during bolus administration of intravenous contrast. Multiplanar CT image reconstructions and MIPs were obtained to evaluate the vascular anatomy. RADIATION DOSE REDUCTION: This exam was performed according to the departmental dose-optimization program which includes automated exposure control, adjustment of the mA and/or kV according to patient size and/or use of iterative reconstruction technique. CONTRAST:  16m OMNIPAQUE IOHEXOL 350 MG/ML SOLN COMPARISON:  Portable chest today, CTA chest 06/14/2021, CTA chest 07/02/2020. FINDINGS: Cardiovascular: There is moderate pan chamber  cardiomegaly. There is a prominent pulmonary trunk 3.4 cm indicating arterial hypertension. No arterial filling defects are seen to the segmental level but the subsegmental arterial bed is obscured by breath

## 2022-01-01 NOTE — TOC Initial Note (Signed)
Transition of Care (TOC) - Initial/Assessment Note  ? ? ?Patient Details  ?Name: Cheryl Mercer ?MRN: [id number] ?Date of Birth: 22-Jan-1986 ? ?Transition of Care (TOC) CM/SW Contact:    ?Truddie Hidden, RN ?Phone Number: ?01/01/2022, 10:15 AM ? ?Clinical Narrative:                 ? ?Transition of Care (TOC) Screening Note ? ? ?Patient Details  ?Name: Cheryl Mercer ?Date of Birth: Mar 08, 1986 ? ? ?Transition of Care (TOC) CM/SW Contact:    ?Truddie Hidden, RN ?Phone Number: ?01/01/2022, 10:15 AM ? ? ? ?Transition of Care Department [facility]) has reviewed patient and no TOC needs have been identified at this time. We will continue to monitor patient advancement through interdisciplinary progression rounds. If new patient transition needs arise, please place a TOC consult. ? ? ? ?  ?  ? ? ?Patient Goals and CMS Choice ?  ?  ?  ? ?Expected Discharge Plan and Services ?  ?  ?  ?  ?  ?                ?  ?  ?  ?  ?  ?  ?  ?  ?  ?  ? ?Prior Living Arrangements/Services ?  ?  ?  ?       ?  ?  ?  ?  ? ?Activities of Daily Living ?Home Assistive Devices/Equipment: None ?ADL Screening (condition at time of admission) ?Patient's cognitive ability adequate to safely complete daily activities?: Yes ?Is the patient deaf or have difficulty hearing?: No ?Does the patient have difficulty seeing, even when wearing glasses/contacts?: No ?Does the patient have difficulty concentrating, remembering, or making decisions?: No ?Patient able to express need for assistance with ADLs?: No ?Does the patient have difficulty dressing or bathing?: No ?Independently performs ADLs?: Yes (appropriate for developmental age) ?Does the patient have difficulty walking or climbing stairs?: No ?Weakness of Legs: Both ?Weakness of Arms/Hands: None ? ?Permission Sought/Granted ?  ?  ?   ?   ?   ?   ? ?Emotional Assessment ?  ?  ?  ?  ?  ?  ? ?Admission diagnosis:  Hyperglycemia [R73.9] ?Hypotension [I95.9] ?Sepsis (HCC) [A41.9] ?Acute on chronic  congestive heart failure, unspecified heart failure type (HCC) [I50.9] ?Acute sepsis (HCC) [A41.9] ?Patient Active Problem List  ? Diagnosis Date Noted  ? No pertinent past medical history 12/31/2021  ? Sepsis (HCC) 12/31/2021  ? Hypotension 12/31/2021  ? Rhinovirus infection   ? CHF (congestive heart failure) (HCC) 06/14/2021  ? Non compliance w medication regimen 06/14/2021  ? Hyponatremia 06/14/2021  ? Acute exacerbation of CHF (congestive heart failure) (HCC) 07/10/2020  ? Apnea   ? Pulmonary infarct (HCC) 12/17/2019  ? Pulmonary embolus (HCC) 12/16/2019  ? CAP (community acquired pneumonia) 12/16/2019  ? Dilated cardiomyopathy (HCC) 11/10/2019  ? History of COVID-19 11/07/2019  ? Acute on chronic combined systolic (congestive) and diastolic (congestive) heart failure (HCC) 11/07/2019  ? Elevated troponin 11/07/2019  ? Viral pneumonia   ? Tachycardia   ? Multifocal pneumonia 09/07/2019  ? Chronic systolic CHF (congestive heart failure) (HCC) 09/07/2019  ? Dyslipidemia 09/07/2019  ? Back pain 09/07/2019  ? Atypical pneumonia 09/07/2019  ? Uncontrolled type 2 diabetes mellitus with hyperglycemia, with long-term current use of insulin (HCC) 03/23/2019  ? Peripheral edema 03/23/2019  ? Cardiomegaly 03/23/2019  ? Proteinuria 03/23/2019  ? Acute bacterial tonsillitis 05/18/2013  ?  Class:  Acute  ? Lower urinary tract infectious disease 07/30/2012  ? ?PCP:  Pcp, No ?Pharmacy:   ?[id number], [place] - [address] ?[address] ?[place] [address] ?Phone: [phone] Fax: [phone] ? ? ? ? ?Social Determinants of Health (SDOH) Interventions ?  ? ?Readmission Risk Interventions ?   ? View : No data to display.  ?  ?  ?  ? ? ? ?

## 2022-01-01 NOTE — Progress Notes (Signed)
Inpatient Diabetes Program Recommendations ? ?AACE/ADA: New Consensus Statement on Inpatient Glycemic Control  ? ?Target Ranges:  Prepandial:   less than 140 mg/dL ?     Peak postprandial:   less than 180 mg/dL (1-2 hours) ?     Critically ill patients:  140 - 180 mg/dL  ? ? Latest Reference Range & Units 12/31/21 01:00 12/31/21 05:02 12/31/21 06:52 12/31/21 07:27 12/31/21 11:41 12/31/21 16:18 12/31/21 21:42 01/01/22 08:00  ?Glucose-Capillary 70 - 99 mg/dL 381 (HH) 829 (H) 937 (H) 292 (H) 317 (H) 377 (H) 432 (H) 283 (H)  ? ?Review of Glycemic Control ? ?Diabetes history: DM ?Outpatient Diabetes medications: Lantus 40 units daily, Novolog 2-15 units TID with meals, Metformin 500 mg BID, Jardiance 10 mg daily ?Current orders for Inpatient glycemic control: Semglee 40 units daily, Novolog 0-15 units TID with meals, Novolog 0-5 units QHS, Novolog 8 units TID with meals, Jardiance 10 mg daily ? ?Inpatient Diabetes Program Recommendations:   ? ?Insulin: Patient received Solumedrol 80 mg on 12/31/21. Noted insulin changes and no other steroids ordered. Will follow trends. ? ?Thanks, ?Orlando Penner, RN, MSN, CDE ?Diabetes Coordinator ?Inpatient Diabetes Program ?[phone] (Team Pager from 8am to 5pm) ? ?

## 2022-01-01 NOTE — Progress Notes (Signed)
*  PRELIMINARY RESULTS* ?Echocardiogram ?2D Echocardiogram has been performed. ? ?Cheryl Mercer ?01/01/2022, 10:28 AM ?

## 2022-01-01 NOTE — Progress Notes (Signed)
?  Progress Note ? ? ?Patient: Cheryl Mercer [id number] DOB: Jun 16, 1986 DOA: 12/31/2021     1 ?DOS: the patient was seen and examined on 01/01/2022 ?  ?Brief hospital course: ?36 year old female with systolic congestive heart failure and dilated cardiomyopathy, type 2 diabetes mellitus and hypertension presented with a 2 to 3-day history of cough with green phlegm with shortness of breath.  She had a fever of 103.4 tachycardic.  She became hypotensive requiring IV fluids and holding her heart failure medications.  She also had a very high sugar in the emergency room.  CT scan of the chest showed waxing and waning chronic nodular disease, enlarged mediastinal and hilar lymph nodes, panchamber cardiomegaly and prominence of the pulmonary trunk.  No pulmonary embolism ? ?Rhinovirus was positive on the culture.   ? ?Assessment and Plan: ?* Sepsis ([place]) ?--Sepsis criteria includes fever, tachycardia and tachypnea with leukocytosis and suspected respiratory tract infection.  --Rhinovirus on respiratory culture.   ?--With hypotension and fluids were given and antihypertensive medications were held.  ?-- Empiric antibiotics for now--change to po azithromycin given chronic lung changes ?-- no indication for steroids. Patient not wheezing. Sats stable on room air. ?-- Sepsis resolved. ? ?CAP (community acquired pneumonia) ?-- Pneumonia appears viral. Just empirically covering with oral antibiotic. Continue nebulizers or inhalers. ?-- No wheezing no respiratory distress. Discontinue steroids. ? ?Hypotension ?--much improved BP. D/c steroids and midodrine ? ?Chronic systolic CHF (congestive heart failure) (HCC) ?-- Blood pressure much improved. IV Lasix being given with cardiology. ?-- Blood pressure remaining stable without midodrine ?-- holding spironolactone and torsemide.  ?--resumed Coreg and jardiance by cardiology.  ? ?Dilated cardiomyopathy ([place]) ?--Management as above.  Last EF on record from October 2022 with  EF 30 to 35%.   ?-- Patient [facility] cardiology. Resume Coreg and Jardiance ?--IV lasix today ?--holding enteresto and spironolactone ? ?Uncontrolled type 2 diabetes mellitus with hyperglycemia, with long-term current use of insulin ([place]) ?-Started on Semglee insulin 40 units daily and sliding scale insulin plus short acting insulin prior to meals. ?--d/c steroids ? ? ? ? ?  ? ?Subjective: overall feels a lot better. Family at bedside. She has some dry cough. Walking around in the room and to the bathroom. Diuresis in well. ? ?Physical Exam: ?Vitals:  ? 01/01/22 0331 01/01/22 0801 01/01/22 0823 01/01/22 1129  ?BP: (!) 145/101 91/72 113/82 (!) 154/97  ?Pulse: (!) 104 (!) 104 (!) 105 (!) 102  ?Resp: 20 19 19 18   ?Temp:  (!) 97.5 ?F (36.4 ?C) (!) 97.5 ?F (36.4 ?C) 97.6 ?F (36.4 ?C)  ?TempSrc:   Oral Oral  ?SpO2: 100% 100% 100% 97%  ?Weight:      ?Height:      ? ?respiratory decreased breath sounds basis. No respiratory distress. Cardiovascular both heart sounds are normal. Abdomen soft benign ?Family Communication: family at bedside ? ?Disposition: ?Status is: Inpatient ?Remains inpatient appropriate because: continue IV diuresis with Lasix. If continues to show improvement discharge tomorrow. ? Planned Discharge Destination: Home ? ? ? ?Time spent: 30 minutes ? ?Author: ?Fritzi Mandes, MD ?01/01/2022 2:43 PM ? ?For on call review [email or web address].  ?

## 2022-01-02 DIAGNOSIS — I420 Dilated cardiomyopathy: Secondary | ICD-10-CM | POA: Diagnosis not present

## 2022-01-02 DIAGNOSIS — B348 Other viral infections of unspecified site: Secondary | ICD-10-CM | POA: Diagnosis not present

## 2022-01-02 DIAGNOSIS — A419 Sepsis, unspecified organism: Secondary | ICD-10-CM | POA: Diagnosis not present

## 2022-01-02 DIAGNOSIS — I509 Heart failure, unspecified: Secondary | ICD-10-CM | POA: Diagnosis not present

## 2022-01-02 LAB — BASIC METABOLIC PANEL
Anion gap: 6 (ref 5–15)
BUN: 16 mg/dL (ref 6–20)
CO2: 33 mmol/L — ABNORMAL HIGH (ref 22–32)
Calcium: 8 mg/dL — ABNORMAL LOW (ref 8.9–10.3)
Chloride: 93 mmol/L — ABNORMAL LOW (ref 98–111)
Creatinine, Ser: 0.68 mg/dL (ref 0.44–1.00)
GFR, Estimated: >60 mL/min (ref >60)
Glucose, Bld: 205 mg/dL — ABNORMAL HIGH (ref 70–99)
Potassium: 3.8 mmol/L (ref 3.5–5.1)
Sodium: 132 mmol/L — ABNORMAL LOW (ref 135–145)

## 2022-01-02 LAB — CBC
HCT: 38.1 % (ref 36.0–46.0)
Hemoglobin: 12.4 g/dL (ref 12.0–15.0)
MCH: 29.2 pg (ref 26.0–34.0)
MCHC: 32.5 g/dL (ref 30.0–36.0)
MCV: 89.6 fL (ref 80.0–100.0)
Platelets: 337 10*3/uL (ref 150–400)
RBC: 4.25 MIL/uL (ref 3.87–5.11)
RDW: 13.4 % (ref 11.5–15.5)
WBC: 10.9 10*3/uL — ABNORMAL HIGH (ref 4.0–10.5)
nRBC: 0.2 % (ref 0.0–0.2)

## 2022-01-02 LAB — GLUCOSE, CAPILLARY: Glucose-Capillary: 168 mg/dL — ABNORMAL HIGH (ref 70–99)

## 2022-01-02 MED ORDER — CARVEDILOL 6.25 MG PO TABS: 6.2500 mg | ORAL_TABLET | Freq: Two times a day (BID) | ORAL | 2 refills | Status: DC

## 2022-01-02 MED ORDER — SPIRONOLACTONE 25 MG PO TABS: 25.0000 mg | ORAL_TABLET | Freq: Every day | ORAL | 1 refills | Status: DC

## 2022-01-02 MED ORDER — SACUBITRIL-VALSARTAN 24-26 MG PO TABS
1.0000 | ORAL_TABLET | Freq: Two times a day (BID) | ORAL | Status: DC
  Administered 2022-01-02: 1 via ORAL
  Filled 2022-01-02: qty 1

## 2022-01-02 MED ORDER — ENTRESTO 24-26 MG PO TABS: 1.0000 | ORAL_TABLET | Freq: Two times a day (BID) | ORAL | 2 refills | Status: DC

## 2022-01-02 MED ORDER — ALBUTEROL SULFATE (2.5 MG/3ML) 0.083% IN NEBU
2.5000 mg | INHALATION_SOLUTION | Freq: Three times a day (TID) | RESPIRATORY_TRACT | Status: DC
  Administered 2022-01-02: 2.5 mg via RESPIRATORY_TRACT
  Filled 2022-01-02: qty 3

## 2022-01-02 MED ORDER — INSULIN GLARGINE 100 UNIT/ML SOLOSTAR PEN: 40.0000 [IU] | PEN_INJECTOR | Freq: Every day | SUBCUTANEOUS | 2 refills | Status: DC

## 2022-01-02 MED ORDER — ATORVASTATIN CALCIUM 40 MG PO TABS: 40.0000 mg | ORAL_TABLET | Freq: Every day | ORAL | 1 refills | Status: DC

## 2022-01-02 MED ORDER — GUAIFENESIN-DM 100-10 MG/5ML PO SYRP: 5.0000 mL | ORAL_SOLUTION | ORAL | 0 refills | Status: DC | PRN

## 2022-01-02 MED ORDER — ATORVASTATIN CALCIUM 40 MG PO TABS: 40.0000 mg | ORAL_TABLET | Freq: Every day | ORAL | 0 refills | Status: DC

## 2022-01-02 MED ORDER — PEN NEEDLES 3/16" 31G X 5 MM MISC
1.0000 | 2 refills | Status: DC | PRN
Start: 2022-01-02 — End: 2022-06-12

## 2022-01-02 MED ORDER — NOVOLOG FLEXPEN 100 UNIT/ML ~~LOC~~ SOPN: 2.0000 [IU] | PEN_INJECTOR | Freq: Three times a day (TID) | SUBCUTANEOUS | 3 refills | Status: DC

## 2022-01-02 MED ORDER — CARVEDILOL 6.25 MG PO TABS
6.2500 mg | ORAL_TABLET | Freq: Two times a day (BID) | ORAL | Status: DC
  Administered 2022-01-02: 6.25 mg via ORAL
  Filled 2022-01-02: qty 1

## 2022-01-02 MED ORDER — METFORMIN HCL 500 MG PO TABS: 500.0000 mg | ORAL_TABLET | Freq: Two times a day (BID) | ORAL | 0 refills | Status: DC

## 2022-01-02 MED ORDER — EMPAGLIFLOZIN 10 MG PO TABS: 10.0000 mg | ORAL_TABLET | Freq: Every day | ORAL | 1 refills | Status: DC

## 2022-01-02 MED ORDER — TORSEMIDE 20 MG PO TABS: 40.0000 mg | ORAL_TABLET | Freq: Every day | ORAL | 2 refills | Status: DC

## 2022-01-02 MED ORDER — LIDOCAINE 5 % EX PTCH: 1.0000 | MEDICATED_PATCH | CUTANEOUS | 0 refills | Status: DC

## 2022-01-02 MED ORDER — SPIRONOLACTONE 25 MG PO TABS: 25.0000 mg | ORAL_TABLET | Freq: Every day | ORAL | 2 refills | Status: DC

## 2022-01-02 MED ORDER — AZITHROMYCIN 250 MG PO TABS: 250.0000 mg | ORAL_TABLET | Freq: Every day | ORAL | 0 refills | Status: DC

## 2022-01-02 MED ORDER — POTASSIUM CHLORIDE CRYS ER 20 MEQ PO TBCR: 20.0000 meq | EXTENDED_RELEASE_TABLET | Freq: Every day | ORAL | 0 refills | Status: DC

## 2022-01-02 MED ORDER — AZITHROMYCIN 250 MG PO TABS: ORAL_TABLET | ORAL | 0 refills | Status: DC

## 2022-01-02 MED ORDER — TORSEMIDE 20 MG PO TABS: 40.0000 mg | ORAL_TABLET | Freq: Every day | ORAL | 1 refills | Status: DC

## 2022-01-02 MED ORDER — METFORMIN HCL 500 MG PO TABS: 500.0000 mg | ORAL_TABLET | Freq: Two times a day (BID) | ORAL | 1 refills | Status: DC

## 2022-01-02 NOTE — Discharge Instructions (Addendum)
Pt advised carb control and 2 g sodium diet and comply with her meds and f/u

## 2022-01-02 NOTE — Discharge Summary (Signed)
Physician Discharge Summary   Patient: Cheryl Mercer MRN: [id number] DOB: March 15, 1986  Admit date:     12/31/2021  Discharge date: 01/02/22  Discharge Physician: Enedina Finner   PCP: Pcp, No   Recommendations at discharge:    F/u Dr Juliann Pares in 1-2 weeks F/u Dr Karna Christmas in 1-2 weeks Keep appt with your PCP  Discharge Diagnoses: Sepsis POA--improved Rhinovirus infection Pleuritic chest pain Severe CMP with acute on chronic systolic Medication noncompliance  Hospital Course: 36 year old female with systolic congestive heart failure and dilated cardiomyopathy, type 2 diabetes mellitus and hypertension presented with a 2 to 3-day history of cough with green phlegm with shortness of breath.  She had a fever of 103.4 tachycardic.  She became hypotensive requiring IV fluids and holding her heart failure medications.  She also had a very high sugar in the emergency room.  CT scan of the chest showed waxing and waning chronic nodular disease, enlarged mediastinal and hilar lymph nodes, panchamber cardiomegaly and prominence of the pulmonary trunk.  No pulmonary embolism  Rhinovirus was positive on the culture.    Assessment and Plan: * Sepsis (HCC) --Sepsis criteria includes fever, tachycardia and tachypnea with leukocytosis and suspected respiratory tract infection.  --Rhinovirus on respiratory culture.   --With hypotension and fluids were given and antihypertensive medications were held.  -- Empiric antibiotics for now--change to po azithromycin given chronic lung changes -- no indication for steroids. Patient not wheezing. Sats stable on room air. -- Sepsis resolved.  CAP (community acquired pneumonia) -- Pneumonia appears viral. Just empirically covering with oral antibiotic. Continue nebulizers or inhalers. -- No wheezing no respiratory distress. Discontinue steroids.  Hypotension --much improved BP. D/c steroids and midodrine  Chronic systolic CHF (congestive heart  failure) (HCC) -- Blood pressure much improved. IV Lasix being given with cardiology. -- Blood pressure remaining stable without midodrine -- holding spironolactone and torsemide.  --resumed Coreg and jardiance by cardiology.   Dilated cardiomyopathy (HCC) --Management as above.  Last EF on record from October 2022 with EF 30 to 35%.   -- Patient follows with [facility] clinic cardiology. Resume Coreg and Jardiance --IV lasix today --holding enteresto and spironolactone  Uncontrolled type 2 diabetes mellitus with hyperglycemia, with long-term current use of insulin (HCC) -Started on Semglee insulin 40 units daily and sliding scale insulin plus short acting insulin prior to meals. --d/c steroids         Consultants: [facility] cardiology, pulmonary Procedures performed: none  Disposition: Home Diet recommendation:  Discharge Diet Orders (From admission, onward)     Start     Ordered   01/02/22 0000  Diet - low sodium heart healthy        01/02/22 0835           Cardiac and Carb modified diet DISCHARGE MEDICATION: Allergies as of 01/02/2022       Reactions   Ibuprofen Anaphylaxis        Medication List     STOP taking these medications    Aleve 220 MG Caps Generic drug: Naproxen Sodium       TAKE these medications    atorvastatin 40 MG tablet Commonly known as: LIPITOR Take 1 tablet (40 mg total) by mouth daily at 6 PM.   azithromycin 250 MG tablet Commonly known as: ZITHROMAX Take as directed   carvedilol 6.25 MG tablet Commonly known as: COREG Take 1 tablet (6.25 mg total) by mouth 2 (two) times daily with a meal.   empagliflozin 10 MG Tabs  tablet Commonly known as: Jardiance Take 1 tablet (10 mg total) by mouth daily before breakfast.   Entresto 24-26 MG Generic drug: sacubitril-valsartan Take 1 tablet by mouth 2 (two) times daily.   guaiFENesin-dextromethorphan 100-10 MG/5ML syrup Commonly known as: ROBITUSSIN DM Take 5 mLs by mouth every 4  (four) hours as needed for cough.   insulin glargine 100 UNIT/ML Solostar Pen Commonly known as: LANTUS Inject 40 Units into the skin daily.   lidocaine 5 % Commonly known as: LIDODERM Place 1 patch onto the skin daily. Remove & Discard patch within 12 hours or as directed by MD Start taking on: Jan 03, 2022   metFORMIN 500 MG tablet Commonly known as: GLUCOPHAGE Take 1 tablet (500 mg total) by mouth 2 (two) times daily with a meal.   NovoLOG FlexPen 100 UNIT/ML FlexPen Generic drug: insulin aspart Inject 2-15 Units into the skin 3 (three) times daily after meals. CBG 121 - 150: 2 units, 151 - 200: 3 units, 201 - 250: 5 units, 251 - 300: 8 units, 301 - 350: 11 units, 351 - 400: 15u   potassium chloride SA 20 MEQ tablet Commonly known as: KLOR-CON M Take 1 tablet (20 mEq total) by mouth daily.   spironolactone 25 MG tablet Commonly known as: ALDACTONE Take 1 tablet (25 mg total) by mouth daily.   torsemide 20 MG tablet Commonly known as: DEMADEX Take 2 tablets (40 mg total) by mouth daily.        Follow-up Information     Dorothyann Peng D, MD. Schedule an appointment as soon as possible for a visit in 1 week(s).   Specialties: Cardiology, Internal Medicine Why: chf f/u Contact information: [address] [place] [address] [phone]                Discharge Exam: Ceasar Mons Weights   12/31/21 0030  Weight: 60.7 kg     Condition at discharge: fair  The results of significant diagnostics from this hospitalization (including imaging, microbiology, ancillary and laboratory) are listed below for reference.   Imaging Studies: CT Angio Chest PE W and/or Wo Contrast  Result Date: 12/31/2021 CLINICAL DATA:  Suspected pulmonary embolism. D-dimer unknown at this time. EXAM: CT ANGIOGRAPHY CHEST WITH CONTRAST TECHNIQUE: Multidetector CT imaging of the chest was performed using the standard protocol during bolus administration of intravenous contrast.  Multiplanar CT image reconstructions and MIPs were obtained to evaluate the vascular anatomy. RADIATION DOSE REDUCTION: This exam was performed according to the departmental dose-optimization program which includes automated exposure control, adjustment of the mA and/or kV according to patient size and/or use of iterative reconstruction technique. CONTRAST:  42mL OMNIPAQUE IOHEXOL 350 MG/ML SOLN COMPARISON:  Portable chest today, CTA chest 06/14/2021, CTA chest 07/02/2020. FINDINGS: Cardiovascular: There is moderate pan chamber cardiomegaly. There is a prominent pulmonary trunk 3.4 cm indicating arterial hypertension. No arterial filling defects are seen to the segmental level but the subsegmental arterial bed is obscured by breathing motion. There is no aortic aneurysm or visible atherosclerosis or dissection. The great vessels are patent. Central pulmonary veins are prominent but no more than previously. No coronary artery calcification is seen. Mediastinum/Nodes: There are mildly prominent mediastinal and hilar lymph nodes with multiple mediastinal locations involved and was seen previously including left-sided prevascular nodes of 1.1 cm in short axis right paratracheal nodes up to 1.2 cm in short axis with a similar sized bilateral hilar nodes, and subcarinal lymph nodes up to 1.7 cm short axis. The lymphadenopathy  is unchanged compared with 06/14/2021 but the lymph nodes are few mm larger than in 2021. Visualized thyroid and axillary spaces are unremarkable. There is no new intrathoracic adenopathy. Thoracic esophagus and trachea show no focal abnormality. Lungs/Pleura: There is interval development of a small right and trace layering left pleural effusions. There is no pneumothorax. There is worsening interstitial septal thickening in the right-greater-than-left lower lung fields, increasing associated ground-glass and small nodular opacities in both lower and right middle lobes. In the upper lobes there were  previously numerous nodules which are much decreased in numbers and with visible pre-existing nodules decreased in size in the interval. There is a new nodule in the lateral base of the right upper lobe measuring 9 mm. Upper Abdomen: No acute abnormality. Musculoskeletal: No acute or significant osseous findings. Review of the MIP images confirms the above findings. IMPRESSION: 1. Again noted prominence of the pulmonary trunk. No embolus through the segmental divisions. Subsegmental arteries obscured by breathing motion. 2. Panchamber cardiomegaly with prominent central pulmonary veins. 3. Small right and trace left pleural effusions with increased lower zonal septal thickening and ground-glass/nodular disease, most likely due to CHF or fluid overload. 4. Waxing and waning of chronic nodular disease also seen and may suggest changes of atypical pulmonary edema or infectious process. Pulmonary medicine consult recommended unless already done. Underlying neoplasm is possible but less likely at this age. 5. Mildly enlarged mediastinal and hilar nodes. Electronically Signed   By: Almira Bar M.D.   On: 12/31/2021 03:33   DG Chest Port 1 View  Result Date: 12/31/2021 CLINICAL DATA:  Possible sepsis EXAM: PORTABLE CHEST 1 VIEW COMPARISON:  None Available. FINDINGS: Cardiac shadow is within normal limits. The lungs are well aerated bilaterally. Mild bibasilar atelectasis is seen. No focal confluent infiltrate or effusion is noted. No bony abnormality is seen. IMPRESSION: Mild bibasilar atelectasis.  Some Electronically Signed   By: Alcide Clever M.D.   On: 12/31/2021 01:06   ECHOCARDIOGRAM COMPLETE  Result Date: 01/01/2022    ECHOCARDIOGRAM REPORT   Patient Name:   SYDNE KRAHL Date of Exam: 01/01/2022 Medical Rec #:  [id number]              Height:       57.0 in Accession #:    [id number]             Weight:       133.8 lb Date of Birth:  1986/05/19             BSA:          1.516 m Patient Age:    35  years               BP:           113/82 mmHg Patient Gender: F                      HR:           104 bpm. Exam Location:  [facility] Procedure: 2D Echo, Color Doppler and Cardiac Doppler Indications:     I50.21 congestive heart failure-Acute Systolic  History:         Patient has prior history of Echocardiogram examinations, most                  recent 06/15/2021. CHF; Risk Factors:Dyslipidemia and Diabetes.  Sonographer:     Humphrey Rolls Referring Phys:  [id number] Alford Highland Diagnosing Phys: Alwyn Pea  MD IMPRESSIONS  1. Left ventricular ejection fraction, by estimation, is 25 to 30%. The left ventricle has severely decreased function. The left ventricle demonstrates global hypokinesis. The left ventricular internal cavity size was moderately to severely dilated. Left ventricular diastolic parameters are consistent with Grade III diastolic dysfunction (restrictive).  2. Right ventricular systolic function is moderately reduced. The right ventricular size is moderately enlarged. Moderately increased right ventricular wall thickness.  3. Left atrial size was mildly dilated.  4. Right atrial size was mildly dilated.  5. The mitral valve is grossly normal. Mild mitral valve regurgitation.  6. The aortic valve is grossly normal. Aortic valve regurgitation is not visualized. Aortic valve sclerosis is present, with no evidence of aortic valve stenosis. FINDINGS  Left Ventricle: Left ventricular ejection fraction, by estimation, is 25 to 30%. The left ventricle has severely decreased function. The left ventricle demonstrates global hypokinesis. The left ventricular internal cavity size was moderately to severely  dilated. There is no left ventricular hypertrophy. Left ventricular diastolic parameters are consistent with Grade III diastolic dysfunction (restrictive). Right Ventricle: The right ventricular size is moderately enlarged. Moderately increased right ventricular wall thickness. Right ventricular systolic  function is moderately reduced. Left Atrium: Left atrial size was mildly dilated. Right Atrium: Right atrial size was mildly dilated. Pericardium: There is no evidence of pericardial effusion. Mitral Valve: The mitral valve is grossly normal. Mild mitral valve regurgitation. MV peak gradient, 5.2 mmHg. The mean mitral valve gradient is 2.0 mmHg. Tricuspid Valve: The tricuspid valve is grossly normal. Tricuspid valve regurgitation is mild. Aortic Valve: The aortic valve is grossly normal. Aortic valve regurgitation is not visualized. Aortic valve sclerosis is present, with no evidence of aortic valve stenosis. Aortic valve mean gradient measures 3.0 mmHg. Aortic valve peak gradient measures 4.5 mmHg. Aortic valve area, by VTI measures 2.17 cm. Pulmonic Valve: The pulmonic valve was grossly normal. Pulmonic valve regurgitation is not visualized. Aorta: The ascending aorta was not well visualized. IAS/Shunts: No atrial level shunt detected by color flow Doppler.  LEFT VENTRICLE PLAX 2D LVIDd:         5.59 cm      Diastology LVIDs:         5.00 cm      LV e' medial:    6.31 cm/s LV PW:         1.05 cm      LV E/e' medial:  18.1 LV IVS:        0.61 cm      LV e' lateral:   7.72 cm/s LVOT diam:     2.00 cm      LV E/e' lateral: 14.8 LV SV:         34 LV SV Index:   22 LVOT Area:     3.14 cm  LV Volumes (MOD) LV vol d, MOD A2C: 143.0 ml LV vol d, MOD A4C: 139.0 ml LV vol s, MOD A2C: 96.4 ml LV vol s, MOD A4C: 88.7 ml LV SV MOD A2C:     46.6 ml LV SV MOD A4C:     139.0 ml LV SV MOD BP:      48.9 ml RIGHT VENTRICLE RV Basal diam:  4.44 cm RV S prime:     9.79 cm/s LEFT ATRIUM             Index        RIGHT ATRIUM           Index LA diam:  4.40 cm 2.90 cm/m   RA Area:     21.00 cm LA Vol (A2C):   44.1 ml 29.08 ml/m  RA Volume:   64.30 ml  42.41 ml/m LA Vol (A4C):   60.0 ml 39.57 ml/m LA Biplane Vol: 52.8 ml 34.82 ml/m  AORTIC VALVE                    PULMONIC VALVE AV Area (Vmax):    2.24 cm     PV Vmax:        0.67 m/s AV Area (Vmean):   2.11 cm     PV Vmean:      50.500 cm/s AV Area (VTI):     2.17 cm     PV VTI:        0.101 m AV Vmax:           106.00 cm/s  PV Peak grad:  1.8 mmHg AV Vmean:          80.700 cm/s  PV Mean grad:  1.0 mmHg AV VTI:            0.156 m AV Peak Grad:      4.5 mmHg AV Mean Grad:      3.0 mmHg LVOT Vmax:         75.70 cm/s LVOT Vmean:        54.300 cm/s LVOT VTI:          0.108 m LVOT/AV VTI ratio: 0.69  AORTA Ao Root diam: 2.90 cm MITRAL VALVE                TRICUSPID VALVE MV Area (PHT): 5.88 cm     TR Peak grad:   34.1 mmHg MV Area VTI:   1.49 cm     TR Vmax:        292.00 cm/s MV Peak grad:  5.2 mmHg MV Mean grad:  2.0 mmHg     SHUNTS MV Vmax:       1.14 m/s     Systemic VTI:  0.11 m MV Vmean:      62.4 cm/s    Systemic Diam: 2.00 cm MV Decel Time: 129 msec MV E velocity: 114.00 cm/s MV A velocity: 39.40 cm/s MV E/A ratio:  2.89 Dwayne D Callwood MD Electronically signed by Alwyn Pea MD Signature Date/Time: 01/01/2022/1:44:53 PM    Final     Microbiology: Results for orders placed or performed during the hospital encounter of 12/31/21  Resp Panel by RT-PCR (Flu A&B, Covid) Nasopharyngeal Swab     Status: None   Collection Time: 12/31/21  1:02 AM   Specimen: Nasopharyngeal Swab; Nasopharyngeal(NP) swabs in vial transport medium  Result Value Ref Range Status   SARS Coronavirus 2 by RT PCR NEGATIVE NEGATIVE Final    Comment: (NOTE) SARS-CoV-2 target nucleic acids are NOT DETECTED.  The SARS-CoV-2 RNA is generally detectable in upper respiratory specimens during the acute phase of infection. The lowest concentration of SARS-CoV-2 viral copies this assay can detect is 138 copies/mL. A negative result does not preclude SARS-Cov-2 infection and should not be used as the sole basis for treatment or other patient management decisions. A negative result may occur with  improper specimen collection/handling, submission of specimen other than nasopharyngeal swab, presence  of viral mutation(s) within the areas targeted by this assay, and inadequate number of viral copies(<138 copies/mL). A negative result must be combined with clinical observations, patient history, and epidemiological information. The expected result  is Negative.  Fact Sheet for Patients:  BloggerCourse.com  Fact Sheet for Healthcare Providers:  SeriousBroker.it  This test is no t yet approved or cleared by the [place] FDA and  has been authorized for detection and/or diagnosis of SARS-CoV-2 by FDA under an Emergency Use Authorization (EUA). This EUA will remain  in effect (meaning this test can be used) for the duration of the COVID-19 declaration under Section 564(b)(1) of the Act, 21 U.S.C.section 360bbb-3(b)(1), unless the authorization is terminated  or revoked sooner.       Influenza A by PCR NEGATIVE NEGATIVE Final   Influenza B by PCR NEGATIVE NEGATIVE Final    Comment: (NOTE) The Xpert Xpress SARS-CoV-2/FLU/RSV plus assay is intended as an aid in the diagnosis of influenza from Nasopharyngeal swab specimens and should not be used as a sole basis for treatment. Nasal washings and aspirates are unacceptable for Xpert Xpress SARS-CoV-2/FLU/RSV testing.  Fact Sheet for Patients: BloggerCourse.com  Fact Sheet for Healthcare Providers: SeriousBroker.it  This test is not yet approved or cleared by the [place] FDA and has been authorized for detection and/or diagnosis of SARS-CoV-2 by FDA under an Emergency Use Authorization (EUA). This EUA will remain in effect (meaning this test can be used) for the duration of the COVID-19 declaration under Section 564(b)(1) of the Act, 21 U.S.C. section 360bbb-3(b)(1), unless the authorization is terminated or revoked.  Performed at [facility], [address] [place], [place] [address]   Blood Culture (routine  x 2)     Status: None (Preliminary result)   Collection Time: 12/31/21  1:28 AM   Specimen: BLOOD  Result Value Ref Range Status   Specimen Description BLOOD LEFT ARM  Final   Special Requests   Final    BOTTLES DRAWN AEROBIC AND ANAEROBIC Blood Culture results may not be optimal due to an excessive volume of blood received in culture bottles   Culture   Final    NO GROWTH 2 DAYS Performed at [facility], [address] [place], [place] [address]    Report Status PENDING  Incomplete  Blood Culture (routine x 2)     Status: None (Preliminary result)   Collection Time: 12/31/21  3:04 AM   Specimen: BLOOD  Result Value Ref Range Status   Specimen Description BLOOD LEFT [facility]  Final   Special Requests   Final    BOTTLES DRAWN AEROBIC AND ANAEROBIC Blood Culture adequate volume   Culture   Final    NO GROWTH 2 DAYS Performed at [facility], [address], [place], [place] [address]    Report Status PENDING  Incomplete  Respiratory (~20 pathogens) panel by PCR     Status: Abnormal   Collection Time: 12/31/21  6:45 AM   Specimen: Nasopharyngeal Swab; Respiratory  Result Value Ref Range Status   Adenovirus NOT DETECTED NOT DETECTED Final   Coronavirus 229E NOT DETECTED NOT DETECTED Final    Comment: (NOTE) The Coronavirus on the Respiratory Panel, DOES NOT test for the novel  Coronavirus (2019 nCoV)    Coronavirus HKU1 NOT DETECTED NOT DETECTED Final   Coronavirus NL63 NOT DETECTED NOT DETECTED Final   Coronavirus OC43 NOT DETECTED NOT DETECTED Final   Metapneumovirus NOT DETECTED NOT DETECTED Final   Rhinovirus / Enterovirus DETECTED (A) NOT DETECTED Final   Influenza A NOT DETECTED NOT DETECTED Final   Influenza B NOT DETECTED NOT DETECTED Final   Parainfluenza Virus 1 NOT DETECTED NOT DETECTED Final  Parainfluenza Virus 2 NOT DETECTED NOT DETECTED Final   Parainfluenza Virus 3 NOT DETECTED NOT DETECTED Final   Parainfluenza Virus 4 NOT DETECTED NOT  DETECTED Final   Respiratory Syncytial Virus NOT DETECTED NOT DETECTED Final   Bordetella pertussis NOT DETECTED NOT DETECTED Final   Bordetella Parapertussis NOT DETECTED NOT DETECTED Final   Chlamydophila pneumoniae NOT DETECTED NOT DETECTED Final   Mycoplasma pneumoniae NOT DETECTED NOT DETECTED Final    Comment: Performed at [facility], [address] [place], [place] [address]    Labs: CBC: Recent Labs  Lab 12/31/21 0040 12/31/21 0550 01/02/22 0616  WBC 12.1* 14.4* 10.9*  NEUTROABS 10.3*  --   --   HGB 12.1 11.8* 12.4  HCT 35.8* 34.7* 38.1  MCV 88.4 87.4 89.6  PLT 234 262 337   Basic Metabolic Panel: Recent Labs  Lab 12/31/21 0040 12/31/21 0550 01/02/22 0616  NA 127*  --  132*  K 3.3*  --  3.8  CL 87*  --  93*  CO2 29  --  33*  GLUCOSE 677*  --  205*  BUN 8  --  16  CREATININE 0.73 0.59 0.68  CALCIUM 7.5*  --  8.0*  MG  --  1.8  --   PHOS  --  2.2*  --    Liver Function Tests: Recent Labs  Lab 12/31/21 0040  AST 66*  ALT 19  ALKPHOS 224*  BILITOT 1.0  PROT 6.7  ALBUMIN 2.2*   CBG: Recent Labs  Lab 01/01/22 0800 01/01/22 1129 01/01/22 1632 01/01/22 2147 01/02/22 0817  GLUCAP 283* 328* 150* 178* 168*    Discharge time spent: greater than 30 minutes.  Signed: Enedina Finner, MD [facility] 01/02/2022

## 2022-01-03 LAB — URINE CULTURE: Culture: >=100000 — AB

## 2022-01-05 LAB — CULTURE, BLOOD (ROUTINE X 2)
Culture: NO GROWTH
Culture: NO GROWTH
Special Requests: ADEQUATE

## 2022-01-06 ENCOUNTER — Emergency Department (HOSPITAL_COMMUNITY): Payer: Medicare Other

## 2022-01-06 ENCOUNTER — Other Ambulatory Visit: Payer: Self-pay

## 2022-01-06 ENCOUNTER — Encounter (HOSPITAL_COMMUNITY): Payer: Self-pay | Admitting: Emergency Medicine

## 2022-01-06 ENCOUNTER — Emergency Department (HOSPITAL_COMMUNITY)
Admission: EM | Admit: 2022-01-06 | Discharge: 2022-01-06 | Disposition: A | Discharge status: Home / Self Care | Payer: Medicare Other | Attending: Emergency Medicine | Admitting: Emergency Medicine

## 2022-01-06 ENCOUNTER — Telehealth: Payer: Self-pay

## 2022-01-06 DIAGNOSIS — R109 Unspecified abdominal pain: Secondary | ICD-10-CM | POA: Diagnosis not present

## 2022-01-06 DIAGNOSIS — E119 Type 2 diabetes mellitus without complications: Secondary | ICD-10-CM | POA: Insufficient documentation

## 2022-01-06 DIAGNOSIS — Z794 Long term (current) use of insulin: Secondary | ICD-10-CM | POA: Insufficient documentation

## 2022-01-06 DIAGNOSIS — I5042 Chronic combined systolic (congestive) and diastolic (congestive) heart failure: Secondary | ICD-10-CM | POA: Insufficient documentation

## 2022-01-06 DIAGNOSIS — Z7984 Long term (current) use of oral hypoglycemic drugs: Secondary | ICD-10-CM | POA: Diagnosis not present

## 2022-01-06 DIAGNOSIS — R079 Chest pain, unspecified: Secondary | ICD-10-CM | POA: Diagnosis present

## 2022-01-06 DIAGNOSIS — R0781 Pleurodynia: Secondary | ICD-10-CM | POA: Diagnosis not present

## 2022-01-06 LAB — URINALYSIS, MICROSCOPIC (REFLEX)

## 2022-01-06 LAB — I-STAT BETA HCG BLOOD, ED (MC, WL, AP ONLY): I-stat hCG, quantitative: <5 m[IU]/mL (ref <5)

## 2022-01-06 LAB — COMPREHENSIVE METABOLIC PANEL
ALT: 24 U/L (ref 0–44)
AST: 67 U/L — ABNORMAL HIGH (ref 15–41)
Albumin: 1.8 g/dL — ABNORMAL LOW (ref 3.5–5.0)
Alkaline Phosphatase: 374 U/L — ABNORMAL HIGH (ref 38–126)
Anion gap: 5 (ref 5–15)
BUN: 11 mg/dL (ref 6–20)
CO2: 28 mmol/L (ref 22–32)
Calcium: 8.3 mg/dL — ABNORMAL LOW (ref 8.9–10.3)
Chloride: 105 mmol/L (ref 98–111)
Creatinine, Ser: 0.6 mg/dL (ref 0.44–1.00)
GFR, Estimated: >60 mL/min (ref >60)
Glucose, Bld: 296 mg/dL — ABNORMAL HIGH (ref 70–99)
Potassium: 4.1 mmol/L (ref 3.5–5.1)
Sodium: 138 mmol/L (ref 135–145)
Total Bilirubin: 0.4 mg/dL (ref 0.3–1.2)
Total Protein: 6.2 g/dL — ABNORMAL LOW (ref 6.5–8.1)

## 2022-01-06 LAB — URINALYSIS, ROUTINE W REFLEX MICROSCOPIC
Bilirubin Urine: NEGATIVE
Glucose, UA: >=500 mg/dL — AB
Ketones, ur: NEGATIVE mg/dL
Leukocytes,Ua: NEGATIVE
Nitrite: NEGATIVE
Protein, ur: 100 mg/dL — AB
Specific Gravity, Urine: 1.02 (ref 1.005–1.030)
pH: 6 (ref 5.0–8.0)

## 2022-01-06 LAB — PROTIME-INR
INR: 1 (ref 0.8–1.2)
Prothrombin Time: 13.2 seconds (ref 11.4–15.2)

## 2022-01-06 LAB — CBC
HCT: 38.5 % (ref 36.0–46.0)
Hemoglobin: 12.4 g/dL (ref 12.0–15.0)
MCH: 29.7 pg (ref 26.0–34.0)
MCHC: 32.2 g/dL (ref 30.0–36.0)
MCV: 92.1 fL (ref 80.0–100.0)
Platelets: 475 10*3/uL — ABNORMAL HIGH (ref 150–400)
RBC: 4.18 MIL/uL (ref 3.87–5.11)
RDW: 13.3 % (ref 11.5–15.5)
WBC: 13.4 10*3/uL — ABNORMAL HIGH (ref 4.0–10.5)
nRBC: 0 % (ref 0.0–0.2)

## 2022-01-06 LAB — LACTIC ACID, PLASMA: Lactic Acid, Venous: 1.3 mmol/L (ref 0.5–1.9)

## 2022-01-06 LAB — BRAIN NATRIURETIC PEPTIDE: B Natriuretic Peptide: 670.2 pg/mL — ABNORMAL HIGH (ref 0.0–100.0)

## 2022-01-06 LAB — LIPASE, BLOOD: Lipase: 62 U/L — ABNORMAL HIGH (ref 11–51)

## 2022-01-06 LAB — TROPONIN I (HIGH SENSITIVITY): Troponin I (High Sensitivity): 19 ng/L — ABNORMAL HIGH (ref <18)

## 2022-01-06 MED ORDER — OXYCODONE-ACETAMINOPHEN 5-325 MG PO TABS: 1.0000 | ORAL_TABLET | Freq: Four times a day (QID) | ORAL | 0 refills | Status: DC | PRN

## 2022-01-06 MED ORDER — BENZONATATE 100 MG PO CAPS: 100.0000 mg | ORAL_CAPSULE | Freq: Three times a day (TID) | ORAL | 0 refills | Status: DC

## 2022-01-06 MED ORDER — LIDOCAINE 5 % EX PTCH
1.0000 | MEDICATED_PATCH | CUTANEOUS | Status: DC
  Administered 2022-01-06: 1 via TRANSDERMAL
  Filled 2022-01-06: qty 1

## 2022-01-06 MED ORDER — LIDOCAINE 5 % EX PTCH: 1.0000 | MEDICATED_PATCH | CUTANEOUS | 0 refills | Status: DC

## 2022-01-06 NOTE — ED Triage Notes (Signed)
Patient coming from home, complaint of left sided rib pain. States she was seen and c/ at [facility] and was having this pain before d/c.

## 2022-01-06 NOTE — ED Provider Triage Note (Signed)
Emergency Medicine Provider Triage Evaluation Note  Cheryl Mercer , a 36 y.o. female  was evaluated in triage.  Pt complains of left sided rib pain x 3 days. Pt was recently discharged from [place] 3 days ago after sepsis related to pneumonia.  She states when she was discharged she was having left-sided rib pain, and asked not to be discharged so soon.  Ever since she got home she is continued to have severe pain that is only getting worse, especially with deep breathing.  Review of Systems  Positive: Rib pain, nasal congestion, cough, shortness of breath Negative: Nausea, vomiting  Physical Exam  BP (!) 141/94 (BP Location: Left Arm)   Pulse (!) 105   Temp 98.3 F (36.8 C)   Resp 18   SpO2 100%  Gen:   Awake, no distress   Resp:  Normal effort  MSK:   Moves extremities without difficulty  Other:  Pt holding left side in pain  Medical Decision Making  Medically screening exam initiated at 9:46 AM.  Appropriate orders placed.  Cheryl Mercer was informed that the remainder of the evaluation will be completed by another provider, this initial triage assessment does not replace that evaluation, and the importance of remaining in the ED until their evaluation is complete.  Will order appropriate labs based on my medical screening evaluation for suspected sepsis due to tachycardia and recent source of infection. To include: CBC, CMP, hCG, lactic acid, blood cultures, chest x-ray   Myeshia Fojtik T, PA-C 01/06/22 [id number]

## 2022-01-06 NOTE — Telephone Encounter (Signed)
Cheryl Mercer was hospitalized 5/16-5/18/2023.   I cannot see when Cheryl Mercer is schedule to see Dr. Jeanene Erb and Dr. Karna Christmas.   I can work her in for follow-up visit. But it might be more beneficial if Cheryl Mercer has scheduled her specialist follow-up or has already seen them

## 2022-01-06 NOTE — Telephone Encounter (Signed)
Patient was seen at ER today (in epic)and was told to follow up with PCP. Patient has a new patient appointment scheduled with Dr Selena Batten but not until July. Dr Selena Batten can you work patient in sooner for N/P and ER or able to see patient for ER follow up and then keep the NP appointment in July?

## 2022-01-06 NOTE — Discharge Instructions (Addendum)
Take pain medicine as prescribed as needed.  I will also prescribe cough suppressant pills to take.  Lidoderm patches will also be sent in for topical relief.  Also provided an information packet on chest wall pain.  Please do not hesitate to return to the emergency department if the worrisome signs and symptoms we discussed become apparent.

## 2022-01-06 NOTE — ED Provider Notes (Signed)
[facility] EMERGENCY DEPARTMENT Provider Note   CSN: [id number] Arrival date & time: 01/06/22  0935     History {Add pertinent medical, surgical, social history, OB history to HPI:1} Chief Complaint  Patient presents with   Abdominal Pain   Chest Pain    Cheryl Mercer is a 36 y.o. female.   Abdominal Pain Associated symptoms: chest pain   Chest Pain Associated symptoms: abdominal pain     36 year old female presents to the ED with left sided chest pain. Patient states that pain has been persistent since her discharge from the hospital. She was unable to pick up her cough medicine as well as lidoderm patches from the pharmacy due to an "insurance issue." She has been coughing persistently. Her chest pain is worse with coughing and pressure such as lying on affected side. She denies trauma to area. She also endorses SOB when she begins to cough and has associated pain; she is not short of breath otherwise. She denies abdominal pain, N/V/D, urinary/vaginal symptoms, change in bowel habits, fever, chills, night sweats, sore throat, congestion.  PMH significant for pulmonary infarct, chronic systolic and diastolic CHF, DM, chronic back pain, dilated cardiomyopathy,   Home Medications Prior to Admission medications   Medication Sig Start Date End Date Taking? Authorizing Provider  atorvastatin (LIPITOR) 40 MG tablet Take 1 tablet (40 mg total) by mouth daily at 6 PM. 01/02/22   Enedina Finner, MD  azithromycin (ZITHROMAX) 250 MG tablet Take 1 tablet (250 mg total) by mouth daily. 01/02/22   Enedina Finner, MD  carvedilol (COREG) 6.25 MG tablet Take 1 tablet (6.25 mg total) by mouth 2 (two) times daily with a meal. 01/02/22   Enedina Finner, MD  empagliflozin (JARDIANCE) 10 MG TABS tablet Take 1 tablet (10 mg total) by mouth daily before breakfast. 01/03/22   Enedina Finner, MD  guaiFENesin-dextromethorphan (ROBITUSSIN DM) 100-10 MG/5ML syrup Take 5 mLs by mouth every 4 (four)  hours as needed for cough. 01/02/22   Enedina Finner, MD  insulin aspart (NOVOLOG FLEXPEN) 100 UNIT/ML FlexPen Inject 2-15 Units into the skin 3 (three) times daily after meals. CBG 121 - 150: 2 units, 151 - 200: 3 units, 201 - 250: 5 units, 251 - 300: 8 units, 301 - 350: 11 units, 351 - 400: 15u 01/02/22   Enedina Finner, MD  insulin glargine (LANTUS) 100 UNIT/ML Solostar Pen Inject 40 Units into the skin daily. 01/02/22   Enedina Finner, MD  Insulin Pen Needle (PEN NEEDLES 3/16") 31G X 5 MM MISC 1 each by Does not apply route as needed. 01/02/22   Enedina Finner, MD  lidocaine (LIDODERM) 5 % Place 1 patch onto the skin daily. Remove & Discard patch within 12 hours or as directed by MD 01/03/22   Enedina Finner, MD  metFORMIN (GLUCOPHAGE) 500 MG tablet Take 1 tablet (500 mg total) by mouth 2 (two) times daily with a meal. 01/02/22   Enedina Finner, MD  potassium chloride SA (KLOR-CON M) 20 MEQ tablet Take 1 tablet (20 mEq total) by mouth daily. 01/02/22   Enedina Finner, MD  sacubitril-valsartan (ENTRESTO) 24-26 MG Take 1 tablet by mouth 2 (two) times daily. 01/02/22   Enedina Finner, MD  spironolactone (ALDACTONE) 25 MG tablet Take 1 tablet (25 mg total) by mouth daily. 01/02/22   Enedina Finner, MD  torsemide (DEMADEX) 20 MG tablet Take 2 tablets (40 mg total) by mouth daily. 01/02/22   Enedina Finner, MD  Allergies    Ibuprofen    Review of Systems   Review of Systems  Cardiovascular:  Positive for chest pain.  Gastrointestinal:  Positive for abdominal pain.   Physical Exam Updated Vital Signs BP (!) 141/94 (BP Location: Left Arm)   Pulse (!) 105   Temp 98.3 F (36.8 C)   Resp 18   SpO2 100%  Physical Exam  ED Results / Procedures / Treatments   Labs (all labs ordered are listed, but only abnormal results are displayed) Labs Reviewed  CULTURE, BLOOD (ROUTINE X 2)  CULTURE, BLOOD (ROUTINE X 2)  LIPASE, BLOOD  COMPREHENSIVE METABOLIC PANEL  CBC  URINALYSIS, ROUTINE W REFLEX MICROSCOPIC  LACTIC ACID, PLASMA   LACTIC ACID, PLASMA  PROTIME-INR  I-STAT BETA HCG BLOOD, ED ([facility], [facility], [facility] ONLY)    EKG None  Radiology No results found.  Procedures Procedures  {Document cardiac monitor, telemetry assessment procedure when appropriate:1}  Medications Ordered in ED Medications - No data to display  ED Course/ Medical Decision Making/ A&P                           Medical Decision Making Amount and/or Complexity of Data Reviewed Labs: ordered. ECG/medicine tests: ordered.  Risk Prescription drug management.   ***  {Document critical care time when appropriate:1} {Document review of labs and clinical decision tools ie heart score, Chads2Vasc2 etc:1}  {Document your independent review of radiology images, and any outside records:1} {Document your discussion with family members, caretakers, and with consultants:1} {Document social determinants of health affecting pt's care:1} {Document your decision making why or why not admission, treatments were needed:1} Final Clinical Impression(s) / ED Diagnoses Final diagnoses:  None    Rx / DC Orders ED Discharge Orders     None

## 2022-01-07 NOTE — Progress Notes (Unsigned)
Patient ID: Cheryl Mercer, female    DOB: Jan 28, 1986, 36 y.o.   MRN: [id number]  HPI  Cheryl Mercer is a 36 y/o female with a history of DM, HTN, chronic back pain, migraines, PE and chronic heart failure.   Echo report from 01/01/22 reviewed and showed an EF of 25-30% along with mild MR. Echo report from 06/15/21 reviewed and showed an EF of 30-35% along with moderate LAE, mild LVH and mild MR.   RHC/LHC done 03/25/2019 and showed: Ao = 122/88 (104) LV = 125/15 RA = 2 RV = 35/6 PA = 37/14 (25) PCW = 18 Fick cardiac output/index = 5.3/3.2 SVR = 1543 PVR = 1.3 WU Ao sat = 99% PA sat = 74%, 76%  Assessment: 1. Normal coronary arteries 2. 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 Well-compensated hemodynamics  Was in the ED 01/06/22 due to chest/abdominal pain. Evaluated and released. Admitted 12/31/21 due to 2 to 3-day history of cough with green phlegm with shortness of breath. She had a fever of 103.4 and tachycardic. IVF given for hypotension. Cardiology consult obtained. + for rhinovirus. Given IV antibiotics due to sepsis with transition to oral meds. Initially given IV lasix with transition to oral diuretics. Discharged after 2 days. Was in the ED 08/28/21 due to leg swelling due to acute on chronic HF.Given IV lasix. Noted to be hyperglycemic with initial glucose of 720 with repeat of 509.She was released.   She presents today for a follow-up visit with a chief complaint of moderate shortness of breath with minimal exertion. Describes this as chronic in nature. She has associated fatigue, pedal edema (worsening), palpitations, difficulty sleeping, leg pain and weight gain along with this. She denies any dizziness, abdominal distention, chest pain or cough.   She says that her legs are so swollen that they feel like they are going to "pop".   Past Medical History:  Diagnosis Date   CHF (congestive heart failure) (HCC)    Chronic back pain    Chronic combined systolic and diastolic congestive  heart failure (HCC) 09/07/2019   Community acquired pneumonia 12/16/2019   Diabetes mellitus without complication (HCC)    History of COVID-19 11/07/2019   Migraines    Pregnancy induced hypertension    Pulmonary embolus (HCC) 12/16/2019   Pulmonary infarct (HCC) 12/17/2019   Past Surgical History:  Procedure Laterality Date   CERVICAL BIOPSY  W/ LOOP ELECTRODE EXCISION  2009   Laparoscopic tubal sterilization with Falope rings.  2009   RIGHT/LEFT HEART CATH AND CORONARY ANGIOGRAPHY N/A 03/25/2019   Procedure: RIGHT/LEFT HEART CATH AND CORONARY ANGIOGRAPHY;  Surgeon: Dolores Patty, MD;  Location: [facility] INVASIVE CV LAB;  Service: Cardiovascular;  Laterality: N/A;   TUBAL LIGATION     Family History  Problem Relation Age of Onset   Hypertension Mother    Anesthesia problems Neg Hx    Social History   Tobacco Use   Smoking status: Never   Smokeless tobacco: Never  Substance Use Topics   Alcohol use: No    Alcohol/week: 6.0 standard drinks    Types: 6 Shots of liquor per week   Allergies  Allergen Reactions   Ibuprofen Anaphylaxis   Prior to Admission medications   Medication Sig Start Date End Date Taking? Authorizing Provider  atorvastatin (LIPITOR) 40 MG tablet Take 1 tablet (40 mg total) by mouth daily at 6 PM. 01/02/22  Yes Enedina Finner, MD  benzonatate (TESSALON) 100 MG capsule Take 1 capsule (100 mg total) by  mouth every 8 (eight) hours. 01/06/22  Yes Sherian Maroon A, PA  carvedilol (COREG) 6.25 MG tablet Take 1 tablet (6.25 mg total) by mouth 2 (two) times daily with a meal. 01/02/22  Yes Enedina Finner, MD  empagliflozin (JARDIANCE) 10 MG TABS tablet Take 1 tablet (10 mg total) by mouth daily before breakfast. 01/03/22  Yes Enedina Finner, MD  insulin aspart (NOVOLOG FLEXPEN) 100 UNIT/ML FlexPen Inject 2-15 Units into the skin 3 (three) times daily after meals. CBG 121 - 150: 2 units, 151 - 200: 3 units, 201 - 250: 5 units, 251 - 300: 8 units, 301 - 350: 11 units, 351 - 400:  15u 01/02/22  Yes Enedina Finner, MD  insulin glargine (LANTUS) 100 UNIT/ML Solostar Pen Inject 40 Units into the skin daily. 01/02/22  Yes Enedina Finner, MD  Insulin Pen Needle (PEN NEEDLES 3/16") 31G X 5 MM MISC 1 each by Does not apply route as needed. 01/02/22  Yes Enedina Finner, MD  metFORMIN (GLUCOPHAGE) 500 MG tablet Take 1 tablet (500 mg total) by mouth 2 (two) times daily with a meal. 01/02/22  Yes Enedina Finner, MD  oxyCODONE-acetaminophen (PERCOCET/ROXICET) 5-325 MG tablet Take 1 tablet by mouth every 6 (six) hours as needed for severe pain. 01/06/22  Yes Sherian Maroon A, PA  potassium chloride SA (KLOR-CON M) 20 MEQ tablet Take 1 tablet (20 mEq total) by mouth daily. 01/02/22  Yes Enedina Finner, MD  sacubitril-valsartan (ENTRESTO) 24-26 MG Take 1 tablet by mouth 2 (two) times daily. 01/02/22  Yes Enedina Finner, MD  spironolactone (ALDACTONE) 25 MG tablet Take 1 tablet (25 mg total) by mouth daily. 01/02/22  Yes Enedina Finner, MD  torsemide (DEMADEX) 20 MG tablet Take 2 tablets (40 mg total) by mouth daily. 01/02/22  Yes Enedina Finner, MD  lidocaine (LIDODERM) 5 % Place 1 patch onto the skin daily. Remove & Discard patch within 12 hours or as directed by MD Patient not taking: Reported on 01/08/2022 01/06/22   Cheryl Garter, PA    Review of Systems  Constitutional:  Positive for fatigue. Negative for appetite change.  HENT:  Negative for congestion, postnasal drip and sore throat.   Eyes: Negative.   Respiratory:  Positive for shortness of breath. Negative for cough.   Cardiovascular:  Positive for palpitations and leg swelling (worsening). Negative for chest pain.  Gastrointestinal:  Negative for abdominal distention and abdominal pain.  Endocrine: Negative.   Genitourinary: Negative.   Musculoskeletal:  Positive for arthralgias (leg pain due to swelling). Negative for back pain.  Skin: Negative.   Allergic/Immunologic: Negative.   Neurological:  Positive for numbness (& tingling in feet). Negative  for dizziness and light-headedness.  Hematological:  Negative for adenopathy. Does not bruise/bleed easily.  Psychiatric/Behavioral:  Positive for sleep disturbance (sleeping on 1.5-2 pillows due to SOB). Negative for dysphoric mood. The patient is not nervous/anxious.    Vitals:   01/08/22 1504  BP: 121/85  Pulse: 100  Resp: 20  SpO2: 100%  Weight: 135 lb 2 oz (61.3 kg)  Height: 5' (1.524 m)   Wt Readings from Last 3 Encounters:  01/08/22 135 lb 2 oz (61.3 kg)  12/31/21 133 lb 13.1 oz (60.7 kg)  09/13/21 131 lb 6 oz (59.6 kg)   Lab Results  Component Value Date   CREATININE 0.60 01/06/2022   CREATININE 0.68 01/02/2022   CREATININE 0.59 12/31/2021   Physical Exam Vitals and nursing note reviewed. Exam conducted with a chaperone present (mom).  Constitutional:  Appearance: Normal appearance.  HENT:     Head: Normocephalic and atraumatic.  Cardiovascular:     Rate and Rhythm: Regular rhythm. Tachycardia present.  Pulmonary:     Effort: Pulmonary effort is normal. No respiratory distress.     Breath sounds: Wheezing (expiratory in lower lobes) present. No rales.  Abdominal:     General: There is distension.     Palpations: Abdomen is soft.  Musculoskeletal:        General: No tenderness.     Cervical back: Normal range of motion and neck supple.     Right lower leg: Edema (3+ pitting) present.     Left lower leg: Edema (3+ pitting) present.  Skin:    General: Skin is warm and dry.  Neurological:     General: No focal deficit present.     Mental Status: She is alert and oriented to person, place, and time.  Psychiatric:        Mood and Affect: Mood normal.        Behavior: Behavior normal.        Thought Content: Thought content normal.   Assessment & Plan:  1: Acute on Chronic heart failure with reduced ejection fraction- - NYHA class III - moderately fluid overloaded today - weighing daily; reminded to call for an overnight weight gain of > 2 pounds or a  weekly weight gain of >5 pounds - weight up 4 pounds from last visit here 4 months ago - says that she's keeping her daily fluid intake to 60 ounces  - not adding salt to her food - same day surgery unable to do IV lasix today so this was scheduled for tomorrow; she will get 80mg  IV Lasix/ PO potassium along with checking BMP/BNP - she watching fursocix video and questions answered; 1 furoscix dose provided and she will do this once she gets home today - will also start verquovo 2.5mg  BID - on GDMT of carvedilol, entresto and spironolactone & jardiance - BNP 01/06/22 was 670.2  2: HTN- - BP looks good (121/85) - has PCP scheduled with [facility] for July - BMP 01/06/22 reviewed and showed sodium 138, potassium 4.1, creatinine 0.6 & GFR >60  3: DM- - home glucose this morning was 247 - A1c 12/31/21 was 15.1%  4: Lymphedema-  - stage 2 - elevating her legs but edema persists - encouraged to wear compression socks daily with removal at bedtime; currently she says that the compression socks hurt her legs - limited in her ability to exercise due to symptoms - consider compression boots if edema persists   Patient did not bring her medications nor a list. Each medication was verbally reviewed with the patient and she was encouraged to bring the bottles to every visit to confirm accuracy of list.   Return in 1 week, sooner if needed.

## 2022-01-08 ENCOUNTER — Ambulatory Visit: Payer: Medicare Other | Attending: Family | Admitting: Family

## 2022-01-08 ENCOUNTER — Encounter: Payer: Self-pay | Admitting: Family

## 2022-01-08 ENCOUNTER — Telehealth: Payer: Self-pay

## 2022-01-08 VITALS — BP 121/85 | HR 100 | Resp 20 | Ht 60.0 in | Wt 135.1 lb

## 2022-01-08 DIAGNOSIS — Z86711 Personal history of pulmonary embolism: Secondary | ICD-10-CM | POA: Insufficient documentation

## 2022-01-08 DIAGNOSIS — M549 Dorsalgia, unspecified: Secondary | ICD-10-CM | POA: Insufficient documentation

## 2022-01-08 DIAGNOSIS — Z79899 Other long term (current) drug therapy: Secondary | ICD-10-CM | POA: Diagnosis not present

## 2022-01-08 DIAGNOSIS — Z7984 Long term (current) use of oral hypoglycemic drugs: Secondary | ICD-10-CM | POA: Insufficient documentation

## 2022-01-08 DIAGNOSIS — I890 Lymphedema, not elsewhere classified: Secondary | ICD-10-CM | POA: Diagnosis not present

## 2022-01-08 DIAGNOSIS — G8929 Other chronic pain: Secondary | ICD-10-CM | POA: Insufficient documentation

## 2022-01-08 DIAGNOSIS — I10 Essential (primary) hypertension: Secondary | ICD-10-CM | POA: Diagnosis not present

## 2022-01-08 DIAGNOSIS — I5023 Acute on chronic systolic (congestive) heart failure: Secondary | ICD-10-CM | POA: Diagnosis present

## 2022-01-08 DIAGNOSIS — E1165 Type 2 diabetes mellitus with hyperglycemia: Secondary | ICD-10-CM | POA: Diagnosis not present

## 2022-01-08 DIAGNOSIS — E119 Type 2 diabetes mellitus without complications: Secondary | ICD-10-CM | POA: Insufficient documentation

## 2022-01-08 DIAGNOSIS — Z794 Long term (current) use of insulin: Secondary | ICD-10-CM | POA: Diagnosis not present

## 2022-01-08 DIAGNOSIS — I110 Hypertensive heart disease with heart failure: Secondary | ICD-10-CM | POA: Insufficient documentation

## 2022-01-08 NOTE — Telephone Encounter (Signed)
Pt called back and said she has an appt with a cardiologist today with [facility] and said she is not aware of any appts with the other 2 mentioned. She is going to see them today and follow up with [place] on what they want her to do next.

## 2022-01-08 NOTE — Patient Instructions (Addendum)
Continue weighing daily and call for an overnight weight gain of 3 pounds or more or a weekly weight gain of more than 5 pounds.   Begin Verquvo 2.5mg  as 1 tablet in the morning and 1 tablet in the evening.    Take the verquovo as 1 tablet in the morning and 1 tablet in the evening.    Your provider has order Furoscix for you. This is an on-body infuser that gives you a dose of Furosemide.   For questions regarding the device call Furoscix Direct at [phone]  Ensure you write down the time you start your infusion so that if there is a problem you will know how long the infusion lasted  Use Furoscix only AS DIRECTED by our office

## 2022-01-08 NOTE — Telephone Encounter (Signed)
Cheryl Mercer from [facility] transitions called and stated that pt was admitted but now discharged from [facility]. On 5.18.23 and if you like her med records or a follow up

## 2022-01-09 ENCOUNTER — Ambulatory Visit
Admission: RE | Admit: 2022-01-09 | Discharge: 2022-01-09 | Disposition: A | Discharge status: Home / Self Care | Payer: Medicare Other | Source: Ambulatory Visit | Attending: Family | Admitting: Family

## 2022-01-09 VITALS — BP 118/77 | HR 92 | Temp 97.4°F | Resp 17

## 2022-01-09 DIAGNOSIS — I509 Heart failure, unspecified: Secondary | ICD-10-CM | POA: Diagnosis present

## 2022-01-09 LAB — BASIC METABOLIC PANEL
Anion gap: 8 (ref 5–15)
BUN: 10 mg/dL (ref 6–20)
CO2: 30 mmol/L (ref 22–32)
Calcium: 8.5 mg/dL — ABNORMAL LOW (ref 8.9–10.3)
Chloride: 99 mmol/L (ref 98–111)
Creatinine, Ser: 0.51 mg/dL (ref 0.44–1.00)
GFR, Estimated: >60 mL/min (ref >60)
Glucose, Bld: 152 mg/dL — ABNORMAL HIGH (ref 70–99)
Potassium: 3.5 mmol/L (ref 3.5–5.1)
Sodium: 137 mmol/L (ref 135–145)

## 2022-01-09 LAB — BRAIN NATRIURETIC PEPTIDE: B Natriuretic Peptide: 983.1 pg/mL — ABNORMAL HIGH (ref 0.0–100.0)

## 2022-01-09 MED ORDER — POTASSIUM CHLORIDE CRYS ER 20 MEQ PO TBCR
EXTENDED_RELEASE_TABLET | ORAL | Status: AC
  Administered 2022-01-09: 40 meq via ORAL
  Filled 2022-01-09: qty 2

## 2022-01-09 MED ORDER — FUROSEMIDE 10 MG/ML IJ SOLN
INTRAMUSCULAR | Status: AC
  Administered 2022-01-09: 80 mg via INTRAVENOUS
  Filled 2022-01-09: qty 8

## 2022-01-09 MED ORDER — POTASSIUM CHLORIDE CRYS ER 20 MEQ PO TBCR: 40.0000 meq | EXTENDED_RELEASE_TABLET | Freq: Once | ORAL | Status: AC

## 2022-01-09 MED ORDER — SODIUM CHLORIDE FLUSH 0.9 % IV SOLN
INTRAVENOUS | Status: AC
  Filled 2022-01-09: qty 10

## 2022-01-09 MED ORDER — FUROSEMIDE 10 MG/ML IJ SOLN: 80.0000 mg | Freq: Once | INTRAMUSCULAR | Status: AC

## 2022-01-09 NOTE — Telephone Encounter (Signed)
Pts mom called back and said pt is getting fluid drained at the hospital right now and said that the cardiologist put her on verquvo and sent her home with a machine with a 1 time use to drain fluid. She said that they wanted to see if we can get her in sooner to see PCP. Does it matter when its scheduled? Please advise. Callback is [phone]

## 2022-01-10 NOTE — Telephone Encounter (Signed)
Tried to call to schedule, unable to lvm

## 2022-01-11 LAB — CULTURE, BLOOD (ROUTINE X 2)
Culture: NO GROWTH
Culture: NO GROWTH
Special Requests: ADEQUATE
Special Requests: ADEQUATE

## 2022-01-13 NOTE — Progress Notes (Unsigned)
Patient ID: Cheryl Mercer, female    DOB: February 03, 1986, 36 y.o.   MRN: [id number]  HPI  Cheryl Mercer is a 36 y/o female with a history of DM, HTN, chronic back pain, migraines, PE and chronic heart failure.   Echo report from 01/01/22 reviewed and showed an EF of 25-30% along with mild MR. Echo report from 06/15/21 reviewed and showed an EF of 30-35% along with moderate LAE, mild LVH and mild MR.   RHC/LHC done 03/25/2019 and showed: Ao = 122/88 (104) LV = 125/15 RA = 2 RV = 35/6 PA = 37/14 (25) PCW = 18 Fick cardiac output/index = 5.3/3.2 SVR = 1543 PVR = 1.3 WU Ao sat = 99% PA sat = 74%, 76%  Assessment: 1. Normal coronary arteries 2. 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 Well-compensated hemodynamics  Was in the ED 01/06/22 due to chest/abdominal pain. Evaluated and released. Admitted 12/31/21 due to 2 to 3-day history of cough with green phlegm with shortness of breath. She had a fever of 103.4 and tachycardic. IVF given for hypotension. Cardiology consult obtained. + for rhinovirus. Given IV antibiotics due to sepsis with transition to oral meds. Initially given IV lasix with transition to oral diuretics. Discharged after 2 days. Was in the ED 08/28/21 due to leg swelling due to acute on chronic HF.Given IV lasix. Noted to be hyperglycemic with initial glucose of 720 with repeat of 509.She was released.   She presents today for a follow-up visit with a chief complaint of minimal shortness of breath with moderate exertion. Describes this as chronic in nature although is better from when she was here last week. She has associated fatigue, cough, pedal edema ("much improved"), palpitations, neuropathy, left rib pain and difficulty sleeping due to rib pain. She denies any abdominal distention, chest pain, dizziness or weight gain.   Received 80mg  IV lasix/ PO potassium last week along with 1 dose of [place] furoscix and starting on verquovo. She says that the furoscix "was great" and she feels  like she had better results from that versus the IV lasix.   She has not taken her medications yet today because she hasn't eaten anything.   Past Medical History:  Diagnosis Date   CHF (congestive heart failure) (HCC)    Chronic back pain    Chronic combined systolic and diastolic congestive heart failure (HCC) 09/07/2019   Community acquired pneumonia 12/16/2019   Diabetes mellitus without complication (HCC)    History of COVID-19 11/07/2019   Migraines    Pregnancy induced hypertension    Pulmonary embolus (HCC) 12/16/2019   Pulmonary infarct (HCC) 12/17/2019   Past Surgical History:  Procedure Laterality Date   CERVICAL BIOPSY  W/ LOOP ELECTRODE EXCISION  2009   Laparoscopic tubal sterilization with Falope rings.  2009   RIGHT/LEFT HEART CATH AND CORONARY ANGIOGRAPHY N/A 03/25/2019   Procedure: RIGHT/LEFT HEART CATH AND CORONARY ANGIOGRAPHY;  Surgeon: Dolores Patty, MD;  Location: [facility];  Service: Cardiovascular;  Laterality: N/A;   TUBAL LIGATION     Family History  Problem Relation Age of Onset   Hypertension Mother    Anesthesia problems Neg Hx    Social History   Tobacco Use   Smoking status: Never   Smokeless tobacco: Never  Substance Use Topics   Alcohol use: No    Alcohol/week: 6.0 standard drinks    Types: 6 Shots of liquor per week   Allergies  Allergen Reactions   Ibuprofen Anaphylaxis  Prior to Admission medications   Medication Sig Start Date End Date Taking? Authorizing Provider  atorvastatin (LIPITOR) 40 MG tablet Take 1 tablet (40 mg total) by mouth daily at 6 PM. 01/02/22  Yes Enedina Finner, MD  benzonatate (TESSALON) 100 MG capsule Take 1 capsule (100 mg total) by mouth every 8 (eight) hours. 01/06/22  Yes Sherian Maroon A, PA  carvedilol (COREG) 6.25 MG tablet Take 1 tablet (6.25 mg total) by mouth 2 (two) times daily with a meal. 01/02/22  Yes Enedina Finner, MD  empagliflozin (JARDIANCE) 10 MG TABS tablet Take 1 tablet (10 mg total)  by mouth daily before breakfast. 01/03/22  Yes Enedina Finner, MD  insulin aspart (NOVOLOG FLEXPEN) 100 UNIT/ML FlexPen Inject 2-15 Units into the skin 3 (three) times daily after meals. CBG 121 - 150: 2 units, 151 - 200: 3 units, 201 - 250: 5 units, 251 - 300: 8 units, 301 - 350: 11 units, 351 - 400: 15u 01/02/22  Yes Enedina Finner, MD  insulin glargine (LANTUS) 100 UNIT/ML Solostar Pen Inject 40 Units into the skin daily. 01/02/22  Yes Enedina Finner, MD  Insulin Pen Needle (PEN NEEDLES 3/16") 31G X 5 MM MISC 1 each by Does not apply route as needed. 01/02/22  Yes Enedina Finner, MD  metFORMIN (GLUCOPHAGE) 500 MG tablet Take 1 tablet (500 mg total) by mouth 2 (two) times daily with a meal. 01/02/22  Yes Enedina Finner, MD  potassium chloride SA (KLOR-CON M) 20 MEQ tablet Take 1 tablet (20 mEq total) by mouth daily. 01/02/22  Yes Enedina Finner, MD  sacubitril-valsartan (ENTRESTO) 24-26 MG Take 1 tablet by mouth 2 (two) times daily. 01/02/22  Yes Enedina Finner, MD  spironolactone (ALDACTONE) 25 MG tablet Take 1 tablet (25 mg total) by mouth daily. 01/02/22  Yes Enedina Finner, MD  torsemide (DEMADEX) 20 MG tablet Take 2 tablets (40 mg total) by mouth daily. 01/02/22  Yes Enedina Finner, MD  Vericiguat 2.5 MG TABS Take 5 mg by mouth daily.   Yes [provider]  lidocaine (LIDODERM) 5 % Place 1 patch onto the skin daily. Remove & Discard patch within 12 hours or as directed by MD Patient not taking: Reported on 01/08/2022 01/06/22   Peter Garter, PA   Review of Systems  Constitutional:  Positive for fatigue. Negative for appetite change.  HENT:  Negative for congestion, postnasal drip and sore throat.   Eyes: Negative.   Respiratory:  Positive for cough and shortness of breath.   Cardiovascular:  Positive for palpitations and leg swelling ("much better"). Negative for chest pain.  Gastrointestinal:  Negative for abdominal distention and abdominal pain.  Endocrine: Negative.   Genitourinary: Negative.    Musculoskeletal:  Positive for arthralgias (left side of ribs). Negative for back pain.  Skin: Negative.   Allergic/Immunologic: Negative.   Neurological:  Positive for numbness (& tingling in feet). Negative for dizziness and light-headedness.  Hematological:  Negative for adenopathy. Does not bruise/bleed easily.  Psychiatric/Behavioral:  Positive for sleep disturbance (sleeping on 1.5-2 pillows). Negative for dysphoric mood. The patient is not nervous/anxious.    Vitals:   01/14/22 0907  BP: (!) 145/103  Pulse: (!) 107  Resp: 14  SpO2: 100%  Weight: 124 lb 2 oz (56.3 kg)  Height: 4\' 11"  (1.499 m)   Wt Readings from Last 3 Encounters:  01/14/22 124 lb 2 oz (56.3 kg)  01/08/22 135 lb 2 oz (61.3 kg)  12/31/21 133 lb 13.1 oz (60.7 kg)  Lab Results  Component Value Date   CREATININE 0.51 01/09/2022   CREATININE 0.60 01/06/2022   CREATININE 0.68 01/02/2022   Physical Exam Vitals and nursing note reviewed. Exam conducted with a chaperone present (2 kids).  Constitutional:      Appearance: Normal appearance.  HENT:     Head: Normocephalic and atraumatic.  Cardiovascular:     Rate and Rhythm: Regular rhythm. Tachycardia present.  Pulmonary:     Effort: Pulmonary effort is normal. No respiratory distress.     Breath sounds: No wheezing, rhonchi or rales.  Abdominal:     General: There is no distension.     Palpations: Abdomen is soft.  Musculoskeletal:        General: No tenderness.     Cervical back: Normal range of motion and neck supple.     Right lower leg: No tenderness. Edema (trace pitting) present.     Left lower leg: No tenderness. Edema (trace pitting) present.  Skin:    General: Skin is warm and dry.  Neurological:     General: No focal deficit present.     Mental Status: She is alert and oriented to person, place, and time.  Psychiatric:        Mood and Affect: Mood normal.        Behavior: Behavior normal.        Thought Content: Thought content normal.    Assessment & Plan:  1: Chronic heart failure with reduced ejection fraction- - NYHA class II - euvolemic today - weighing daily; reminded to call for an overnight weight gain of > 2 pounds or a weekly weight gain of >5 pounds - weight down 11 pounds from last visit here 6 days ago - says that she's keeping her daily fluid intake to 60 ounces  - not adding salt to her food - received 80mg  IV lasix/ PO potassium along with 1 dose of furoscix given last week; feels like she had a better response to the SQ furoscix versus IV lasix - to take verquovo as 2 tablets once daily (5mg  total); plan to increase to 10mg  daily at next visit - check BMP/BNP today - on GDMT of carvedilol, entresto and spironolactone & jardiance - referral placed to [facility] cardiology to get established (patient's choice as they are in the same building) - consider GDMT titration at next visit - BNP 01/09/22 was 983.1 - PharmD reconciled medications with the patient  2: HTN- - BP elevated (145/103) but she hasn't taken her medications yet today - instructed to get a BP monitor and check her BP daily - emphasized taking her medications prior to her appointments to assess the effectiveness of her medications - has PCP scheduled with [facility] for July - BMP 01/09/22 reviewed and showed sodium 137, potassium 3.5, creatinine 0.51 & GFR >60  3: DM- - glucose in clinic this morning was 334 - A1c 12/31/21 was 15.1%  4: Lymphedema-  - resolved   Patient did not bring her medications nor a list. Each medication was verbally reviewed with the patient and she was encouraged to bring the bottles to every visit to confirm accuracy of list.   Return in 2 weeks, sooner if needed

## 2022-01-14 ENCOUNTER — Encounter: Payer: Self-pay | Admitting: Family

## 2022-01-14 ENCOUNTER — Other Ambulatory Visit
Admission: RE | Admit: 2022-01-14 | Discharge: 2022-01-14 | Disposition: A | Discharge status: Home / Self Care | Payer: Medicare Other | Source: Ambulatory Visit | Attending: Family | Admitting: Family

## 2022-01-14 ENCOUNTER — Ambulatory Visit (HOSPITAL_BASED_OUTPATIENT_CLINIC_OR_DEPARTMENT_OTHER): Payer: Medicare Other | Admitting: Family

## 2022-01-14 VITALS — BP 145/103 | HR 107 | Resp 14 | Ht 59.0 in | Wt 124.1 lb

## 2022-01-14 DIAGNOSIS — E1165 Type 2 diabetes mellitus with hyperglycemia: Secondary | ICD-10-CM | POA: Insufficient documentation

## 2022-01-14 DIAGNOSIS — R002 Palpitations: Secondary | ICD-10-CM | POA: Insufficient documentation

## 2022-01-14 DIAGNOSIS — I10 Essential (primary) hypertension: Secondary | ICD-10-CM

## 2022-01-14 DIAGNOSIS — I340 Nonrheumatic mitral (valve) insufficiency: Secondary | ICD-10-CM | POA: Insufficient documentation

## 2022-01-14 DIAGNOSIS — G43909 Migraine, unspecified, not intractable, without status migrainosus: Secondary | ICD-10-CM | POA: Insufficient documentation

## 2022-01-14 DIAGNOSIS — R059 Cough, unspecified: Secondary | ICD-10-CM | POA: Insufficient documentation

## 2022-01-14 DIAGNOSIS — I110 Hypertensive heart disease with heart failure: Secondary | ICD-10-CM | POA: Insufficient documentation

## 2022-01-14 DIAGNOSIS — Z7984 Long term (current) use of oral hypoglycemic drugs: Secondary | ICD-10-CM | POA: Insufficient documentation

## 2022-01-14 DIAGNOSIS — M549 Dorsalgia, unspecified: Secondary | ICD-10-CM | POA: Insufficient documentation

## 2022-01-14 DIAGNOSIS — Z86711 Personal history of pulmonary embolism: Secondary | ICD-10-CM | POA: Insufficient documentation

## 2022-01-14 DIAGNOSIS — R0781 Pleurodynia: Secondary | ICD-10-CM | POA: Insufficient documentation

## 2022-01-14 DIAGNOSIS — G8929 Other chronic pain: Secondary | ICD-10-CM | POA: Insufficient documentation

## 2022-01-14 DIAGNOSIS — I5022 Chronic systolic (congestive) heart failure: Secondary | ICD-10-CM | POA: Diagnosis not present

## 2022-01-14 DIAGNOSIS — Z79899 Other long term (current) drug therapy: Secondary | ICD-10-CM | POA: Insufficient documentation

## 2022-01-14 DIAGNOSIS — Z794 Long term (current) use of insulin: Secondary | ICD-10-CM | POA: Insufficient documentation

## 2022-01-14 DIAGNOSIS — I5042 Chronic combined systolic (congestive) and diastolic (congestive) heart failure: Secondary | ICD-10-CM | POA: Insufficient documentation

## 2022-01-14 DIAGNOSIS — I890 Lymphedema, not elsewhere classified: Secondary | ICD-10-CM

## 2022-01-14 LAB — BASIC METABOLIC PANEL
Anion gap: 7 (ref 5–15)
BUN: 11 mg/dL (ref 6–20)
CO2: 27 mmol/L (ref 22–32)
Calcium: 8.9 mg/dL (ref 8.9–10.3)
Chloride: 102 mmol/L (ref 98–111)
Creatinine, Ser: 0.52 mg/dL (ref 0.44–1.00)
GFR, Estimated: >60 mL/min (ref >60)
Glucose, Bld: 349 mg/dL — ABNORMAL HIGH (ref 70–99)
Potassium: 3.6 mmol/L (ref 3.5–5.1)
Sodium: 136 mmol/L (ref 135–145)

## 2022-01-14 LAB — GLUCOSE, CAPILLARY: Glucose-Capillary: 334 mg/dL — ABNORMAL HIGH (ref 70–99)

## 2022-01-14 LAB — BRAIN NATRIURETIC PEPTIDE: B Natriuretic Peptide: 758 pg/mL — ABNORMAL HIGH (ref 0.0–100.0)

## 2022-01-14 NOTE — Patient Instructions (Addendum)
Continue weighing daily and call for an overnight weight gain of 3 pounds or more or a weekly weight gain of more than 5 pounds.   If you have voicemail, please make sure your mailbox is cleaned out so that we may leave a message and please make sure to listen to any voicemails.    Get a blood pressure monitor and check it once a day   Take your verquovo once daily   Take your medications before coming to your appointments

## 2022-01-14 NOTE — Progress Notes (Signed)
Cheryl Mercer - PHARMACIST COUNSELING NOTE  Evidence Based Medicine for HFpEF  ACE/ARB/ARNI: Sacubitril-valsartan 24-26 mg twice daily Beta Blocker: Carvedilol 6.25 mg twice daily Aldosterone Antagonist: Spironolactone 25 mg daily Diuretic: Torsemide 40 mg daily SGLT2i: Empagliflozin 10 mg daily Vericiguat 5 mg daily  Adherence Assessment  Do you ever forget to take your medication? [] Yes [x] No  Do you ever skip doses due to side effects? [] Yes [x] No  Do you have trouble affording your medicines? [] Yes [x] No  Are you ever unable to pick up your medication due to transportation difficulties? [] Yes [] No  Do you ever stop taking your medications because you don't believe they are helping? [] Yes [x] No  Do you check your weight daily? [] Yes [] No   Adherence strategy: takes morning medications together and evening medications at 6PM  Barriers to obtaining medications: n/a  Vital signs: HR 82, BP 142/11, weight (pounds) 124 lb ECHO: Date 01/01/2022, EF 25-30%, notes Grade III diastolic dysfunction, mild mitral valve regurgitation Cath: Date 03/25/2019, EF 15%     Latest Ref Rng & Units 01/09/2022    2:17 PM 01/06/2022    9:54 AM 01/02/2022    6:16 AM  BMP  Glucose 70 - 99 mg/dL 152   296   205    BUN 6 - 20 mg/dL 10   11   16     Creatinine 0.44 - 1.00 mg/dL 0.51   0.60   0.68    Sodium 135 - 145 mmol/L 137   138   132    Potassium 3.5 - 5.1 mmol/L 3.5   4.1   3.8    Chloride 98 - 111 mmol/L 99   105   93    CO2 22 - 32 mmol/L 30   28   33    Calcium 8.9 - 10.3 mg/dL 8.5   8.3   8.0      Past Medical History:  Diagnosis Date   CHF (congestive heart failure) (HCC)    Chronic back pain    Chronic combined systolic and diastolic congestive heart failure ([place]) 09/07/2019   Community acquired pneumonia 12/16/2019   Diabetes mellitus without complication ([place])    History of COVID-19 11/07/2019   Migraines    Pregnancy induced  hypertension    Pulmonary embolus ([place]) 12/16/2019   Pulmonary infarct ([place]) 12/17/2019    ASSESSMENT 36 year old female who presents to the HF clinic for follow up. PMH relevant to DM, HTN, chronic back pain, migraines, PE, and HFrEF. On GDMT including carvedilol, Entresto, spironolactone and Jardiance. Recently received Furocix injection with subjective improvement.   Recent ED Visit (past 6 months): Date - 08/28/21, [facility] - leg swelling  PLAN Reconciled medications Counseled on adverse effects of medications as well as importance of daily blood pressure and weight assessment Recommend BMP to assess renal function and potassium Consider increasing dose of carvedilol to target at next visit  Time spent: 30 minutes  Wynelle Cleveland, PharmD Pharmacy Resident  01/14/2022 9:55 AM   Current Outpatient Medications:    atorvastatin (LIPITOR) 40 MG tablet, Take 1 tablet (40 mg total) by mouth daily at 6 PM., Disp: 30 tablet, Rfl: 1   benzonatate (TESSALON) 100 MG capsule, Take 1 capsule (100 mg total) by mouth every 8 (eight) hours., Disp: 21 capsule, Rfl: 0   carvedilol (COREG) 6.25 MG tablet, Take 1 tablet (6.25 mg total) by mouth 2 (two) times daily with a meal., Disp: 60 tablet, Rfl:  2   empagliflozin (JARDIANCE) 10 MG TABS tablet, Take 1 tablet (10 mg total) by mouth daily before breakfast., Disp: 30 tablet, Rfl: 1   insulin aspart (NOVOLOG FLEXPEN) 100 UNIT/ML FlexPen, Inject 2-15 Units into the skin 3 (three) times daily after meals. CBG 121 - 150: 2 units, 151 - 200: 3 units, 201 - 250: 5 units, 251 - 300: 8 units, 301 - 350: 11 units, 351 - 400: 15u, Disp: 15 mL, Rfl: 3   insulin glargine (LANTUS) 100 UNIT/ML Solostar Pen, Inject 40 Units into the skin daily., Disp: 1 mL, Rfl: 2   Insulin Pen Needle (PEN NEEDLES 3/16") 31G X 5 MM MISC, 1 each by Does not apply route as needed., Disp: 100 each, Rfl: 2   lidocaine (LIDODERM) 5 %, Place 1 patch onto the skin daily. Remove & Discard patch  within 12 hours or as directed by MD (Patient not taking: Reported on 01/08/2022), Disp: 30 patch, Rfl: 0   metFORMIN (GLUCOPHAGE) 500 MG tablet, Take 1 tablet (500 mg total) by mouth 2 (two) times daily with a meal., Disp: 60 tablet, Rfl: 1   potassium chloride SA (KLOR-CON M) 20 MEQ tablet, Take 1 tablet (20 mEq total) by mouth daily., Disp: 30 tablet, Rfl: 0   sacubitril-valsartan (ENTRESTO) 24-26 MG, Take 1 tablet by mouth 2 (two) times daily., Disp: 60 tablet, Rfl: 2   spironolactone (ALDACTONE) 25 MG tablet, Take 1 tablet (25 mg total) by mouth daily., Disp: 30 tablet, Rfl: 2   torsemide (DEMADEX) 20 MG tablet, Take 2 tablets (40 mg total) by mouth daily., Disp: 60 tablet, Rfl: 2   Vericiguat 2.5 MG TABS, Take 5 mg by mouth daily., Disp: , Rfl:    COUNSELING POINTS/CLINICAL PEARLS   DRUGS TO CAUTION IN HEART FAILURE  Drug or Class Mechanism  Analgesics NSAIDs COX-2 inhibitors Glucocorticoids  Sodium and water retention, increased systemic vascular resistance, decreased response to diuretics   Diabetes Medications Metformin Thiazolidinediones Rosiglitazone (Avandia) Pioglitazone (Actos) DPP4 Inhibitors Saxagliptin (Onglyza) Sitagliptin (Januvia)   Lactic acidosis Possible calcium channel blockade   Unknown  Antihypertensives Alpha Blockers Doxazosin Calcium Channel Blockers Diltiazem Verapamil Nifedipine Central Alpha Adrenergics Moxonidine Peripheral Vasodilators Minoxidil  Increases renin and aldosterone  Negative inotrope    Possible sympathetic withdrawal  Unknown  Adapted from Page Carleene Overlie, et al. "Drugs That May Cause or Exacerbate Heart Failure: A Scientific Statement from the American Heart  Association." Circulation 2016; 134:e32-e69. DOI: 10.1161/CIR.0000000000000426   MEDICATION ADHERENCES TIPS AND STRATEGIES Taking medication as prescribed improves patient outcomes in heart failure (reduces hospitalizations, improves symptoms, increases  survival) Side effects of medications can be managed by decreasing doses, switching agents, stopping drugs, or adding additional therapy. Please let someone in the [facility] know if you have having bothersome side effects so we can modify your regimen. Do not alter your medication regimen without talking to [place].  Medication reminders can help patients remember to take drugs on time. If you are missing or forgetting doses you can try linking behaviors, using pill boxes, or an electronic reminder like an alarm on your phone or an app. Some people can also get automated phone calls as medication reminders.

## 2022-01-15 NOTE — Telephone Encounter (Signed)
I called both contacts and pts sister answered and said she will have the pt give [place] a call back

## 2022-01-15 NOTE — Telephone Encounter (Signed)
Tried to call again to schedule but was unsuccessful

## 2022-01-27 NOTE — Progress Notes (Deleted)
Patient ID: Cheryl Mercer, female    DOB: 1986-08-12, 36 y.o.   MRN: [id number]  HPI  [name] is a 36 y/o female with a history of DM, HTN, chronic back pain, migraines, PE and chronic heart failure.   Echo report from 01/01/22 reviewed and showed an EF of 25-30% along with mild MR. Echo report from 06/15/21 reviewed and showed an EF of 30-35% along with moderate LAE, mild LVH and mild MR.   RHC/LHC done 03/25/2019 and showed: Ao = 122/88 (104) LV = 125/15 RA = 2 RV = 35/6 PA = 37/14 (25) PCW = 18 Fick cardiac output/index = 5.3/3.2 SVR = 1543 PVR = 1.3 WU Ao sat = 99% PA sat = 74%, 76%  Assessment: 1. Normal coronary arteries 2. 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 Well-compensated hemodynamics  Was in the ED 01/06/22 due to chest/abdominal pain. Evaluated and released. Admitted 12/31/21 due to 2 to 3-day history of cough with green phlegm with shortness of breath. She had a fever of 103.4 and tachycardic. IVF given for hypotension. Cardiology consult obtained. + for rhinovirus. Given IV antibiotics due to sepsis with transition to oral meds. Initially given IV lasix with transition to oral diuretics. Discharged after 2 days. Was in the ED 08/28/21 due to leg swelling due to acute on chronic HF.Given IV lasix. Noted to be hyperglycemic with initial glucose of 720 with repeat of 509.She was released.   She presents today for a follow-up visit with a chief complaint of   Past Medical History:  Diagnosis Date   CHF (congestive heart failure) (HCC)    Chronic back pain    Chronic combined systolic and diastolic congestive heart failure (HCC) 09/07/2019   Community acquired pneumonia 12/16/2019   Diabetes mellitus without complication (HCC)    History of COVID-19 11/07/2019   Migraines    Pregnancy induced hypertension    Pulmonary embolus (HCC) 12/16/2019   Pulmonary infarct (HCC) 12/17/2019   Past Surgical History:  Procedure Laterality Date   CERVICAL BIOPSY  W/ LOOP ELECTRODE  EXCISION  2009   Laparoscopic tubal sterilization with Falope rings.  2009   RIGHT/LEFT HEART CATH AND CORONARY ANGIOGRAPHY N/A 03/25/2019   Procedure: RIGHT/LEFT HEART CATH AND CORONARY ANGIOGRAPHY;  Surgeon: Dolores Patty, MD;  Location: [facility];  Service: Cardiovascular;  Laterality: N/A;   TUBAL LIGATION     Family History  Problem Relation Age of Onset   Hypertension Mother    Anesthesia problems Neg Hx    Social History   Tobacco Use   Smoking status: Never   Smokeless tobacco: Never  Substance Use Topics   Alcohol use: No    Alcohol/week: 6.0 standard drinks of alcohol    Types: 6 Shots of liquor per week   Allergies  Allergen Reactions   Ibuprofen Anaphylaxis    Review of Systems  Constitutional:  Positive for fatigue. Negative for appetite change.  HENT:  Negative for congestion, postnasal drip and sore throat.   Eyes: Negative.   Respiratory:  Positive for cough and shortness of breath.   Cardiovascular:  Positive for palpitations and leg swelling ("much better"). Negative for chest pain.  Gastrointestinal:  Negative for abdominal distention and abdominal pain.  Endocrine: Negative.   Genitourinary: Negative.   Musculoskeletal:  Positive for arthralgias (left side of ribs). Negative for back pain.  Skin: Negative.   Allergic/Immunologic: Negative.   Neurological:  Positive for numbness (& tingling in feet). Negative for dizziness and  light-headedness.  Hematological:  Negative for adenopathy. Does not bruise/bleed easily.  Psychiatric/Behavioral:  Positive for sleep disturbance (sleeping on 1.5-2 pillows). Negative for dysphoric mood. The patient is not nervous/anxious.       Physical Exam Vitals and nursing note reviewed. Exam conducted with a chaperone present (2 kids).  Constitutional:      Appearance: Normal appearance.  HENT:     Head: Normocephalic and atraumatic.  Cardiovascular:     Rate and Rhythm: Regular rhythm. Tachycardia  present.  Pulmonary:     Effort: Pulmonary effort is normal. No respiratory distress.     Breath sounds: No wheezing, rhonchi or rales.  Abdominal:     General: There is no distension.     Palpations: Abdomen is soft.  Musculoskeletal:        General: No tenderness.     Cervical back: Normal range of motion and neck supple.     Right lower leg: No tenderness. Edema (trace pitting) present.     Left lower leg: No tenderness. Edema (trace pitting) present.  Skin:    General: Skin is warm and dry.  Neurological:     General: No focal deficit present.     Mental Status: She is alert and oriented to person, place, and time.  Psychiatric:        Mood and Affect: Mood normal.        Behavior: Behavior normal.        Thought Content: Thought content normal.    Assessment & Plan:  1: Chronic heart failure with reduced ejection fraction- - NYHA class II - euvolemic today - weighing daily; reminded to call for an overnight weight gain of > 2 pounds or a weekly weight gain of >5 pounds - weight 124.2 pounds from last visit here 2 weeks ago - says that she's keeping her daily fluid intake to 60 ounces  - not adding salt to her food  - to take verquovo as 2 tablets once daily (5mg  total); plan to increase to 10mg  daily at next visit  - on GDMT of carvedilol, entresto and spironolactone & jardiance - to see [facility] cardiology ) 02/20/22 - consider GDMT titration at next visit - BNP 01/14/22 was 758.0 - PharmD reconciled medications with the patient  2: HTN- - BP  - instructed to get a BP monitor and check her BP daily - emphasized taking her medications prior to her appointments to assess the effectiveness of her medications - has PCP scheduled with [facility] for 03/04/22 - BMP 01/14/22 reviewed and showed sodium 136, potassium 3.6, creatinine 0.52 & GFR >60  3: DM- - glucose in clinic this morning was  - A1c 12/31/21 was 15.1%  4: Lymphedema-  - resolved   Patient did not  bring her medications nor a list. Each medication was verbally reviewed with the patient and she was encouraged to bring the bottles to every visit to confirm accuracy of list.

## 2022-01-28 ENCOUNTER — Ambulatory Visit: Payer: Medicare Other | Admitting: Family

## 2022-01-28 ENCOUNTER — Telehealth: Payer: Self-pay | Admitting: Family

## 2022-01-28 NOTE — Telephone Encounter (Signed)
Patient did not show for her Heart Failure Clinic appointment on 01/28/22. Will attempt to reschedule.

## 2022-02-19 NOTE — Progress Notes (Deleted)
Primary Care Provider: Pcp, No [facility] Cardiologist: None Advanced Heart Failure Cardiologist Had Been Dr. Gala Romney Electrophysiologist: None  Clinic Note: No chief complaint on file.   ===================================  ASSESSMENT/PLAN   Problem List Items Addressed This Visit   None   ===================================  HPI:    Cheryl Mercer is a 36 y.o. Hispanic female with PMH notable for NICM with chronic HFrEF, HTN, poorly controlled DM-2 who is being seen today TO REINITIATE "PRIMARY CARDIOLOGY CARE " at the request of Cheryl Freeze, FNP.  Cheryl Mercer was last seen on ***  Last seen advanced heart failure clinic at [facility] on 07/24/2020 by Cheryl Medici, PA. -> Noted history: 03/2019: Admitted with dyspnea and diagnosed with CHF.  Treated with IV diuresis and initiated on medications. 2D echo showed severely reduced LVEF at 10-15% and moderately reduced RV. RHC/LHC  showed no CAD. Well compensated hemodynamics with low filling pressures. 03/25/19 cMRI w/ EF at 15% and no significant LGE.  Started on guidelines directed medical therapy>>Entresto, Jardiance, spironolactone and digoxin. Lasix was discontinued. At f/u visit, Coreg was added to regimen for tachycardia and HTN. Seen in follow-up 1 month later doing well.  Unfortunately was lost to follow-up in advanced heart failure clinic.  Had been seen by general cardiology several times and had been readmitted. Had COVID in January 2021 Admitted April 2021 with PE and pulm infarction-started on Eliquis. Admitted January 2021 with CHF => apparently she had lost her insurance and was no longer on Entresto or Jardiance. => Was discharged on Coreg, lisinopril, Aldactone and oral Lasix.  Eliquis was discontinued. Final visit 07/24/2020: Noted she was "carrying fluid ".  Noting exertional dyspnea orthopnea.  Poor urine response to the 20 mg p.o. Lasix.  Increase to 40 mg but still no UOP.   Admitted to high sodium intake. ReDs Clip elevated to 43%. => Did not qualify for At Home Trial.  Volume overloaded -> NYHA class II-III => Lasix increased to 60 mg daily, converted from lisinopril to Entresto.  Jardiance not restarted because of elevated A1c (and concern for GU infections) Intention was 34-month follow-up echo and consider ICD if EF remained <35%.  Lost to follow-up until seen by Cheryl Kindred, FNP at the [facility] CHF clinic in January 2023 following hospitalization. \ Recent Hospitalizations: *** . Echo report from 01/01/22 reviewed and showed an EF of 25-30% along with mild MR. Echo report from 06/15/21 reviewed and showed an EF of 30-35% along with moderate LAE, mild LVH and mild MR.    RHC/LHC done 03/25/2019 and showed: Ao = 122/88 (104) LV = 125/15 RA = 2 RV = 35/6 PA = 37/14 (25) PCW = 18 Fick cardiac output/index = 5.3/3.2 SVR = 1543 PVR = 1.3 WU Ao sat = 99% PA sat = 74%, 76%  Assessment: 1. Normal coronary arteries 2. 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 Well-compensated hemodynamics   Was in the ED 01/06/22 due to chest/abdominal pain. Evaluated and released. Admitted 12/31/21 due to 2 to 3-day history of cough with green phlegm with shortness of breath. She had a fever of 103.4 and tachycardic. IVF given for hypotension. Cardiology consult obtained. + for rhinovirus. Given IV antibiotics due to sepsis with transition to oral meds. Initially given IV lasix with transition to oral diuretics. Discharged after 2 days. Was in the ED 08/28/21 due to leg swelling due to acute on chronic HF.Given IV lasix. Noted to be hyperglycemic with initial glucose of 720 with repeat  of 509.She was released.    She presents today for a follow-up visit with a chief complaint of minimal shortness of breath with moderate exertion. Describes this as chronic in nature although is better from when she was here last week. She has associated fatigue, cough, pedal edema ("much improved"), palpitations, neuropathy,  left rib pain and difficulty sleeping due to rib pain. She denies any abdominal distention, chest pain, dizziness or weight gain.    Received 80mg  IV lasix/ PO potassium last week along with 1 dose of Rochelle furoscix and starting on verquovo. She says that the furoscix "was great" and she feels like she had better results from that versus the IV lasix.    She has not taken her medications yet today because she hasn't eaten anything  Reviewed  CV studies:    The following studies were reviewed today: (if available, images/films reviewed: From Epic Chart or Care Everywhere) TTE: 03/23/2019: EF 10-15%, severely dilated, global HK.  Moderately reduced RV systolic function.  RVP 34 mmHg.  Moderate LA and mild RA dilation. 11/07/2019: EF 20-25%.  Severely decreased function with global HK.  Mild dilation.  Indeterminate filling.  Mildly elevated PAP.  Moderately use RV EF.  Mild LA dilation.  Mild MR.  Normal RAP. 12/20/2019: EF 25 to 30%.  Global HK.  GRII DD.  Elevated LAP.  Normal PAP.  Mild MR.  Normal aortic valve.  Normal to mildly elevated RAP. 06/15/2021: EF 30 to 35%.  Moderately reduced function with global HK.  Normal diastolic function.  Normal RV function.  Moderate LAE mild RAE.  Mild MR. 01/01/2022: EF 25 to 30%.  Severely reduced function.  Level HK.  Moderate dilated LV with GR 3 DD-restrictive physiology That.  Mildly increased RV thickness, moderately large.  Moderately reduced function.  Blood pressure mildly dilated.  Mild MR   Interval History:   Cheryl Mercer   CV Review of Symptoms (Summary) Cardiovascular ROS: {roscv:310661}  REVIEWED OF SYSTEMS   ROS  I have reviewed and (if needed) personally updated the patient's problem list, medications, allergies, past medical and surgical history, social and family history.   PAST MEDICAL HISTORY   Past Medical History:  Diagnosis Date   CHF (congestive heart failure) (HCC)    Chronic back pain    Chronic combined  systolic and diastolic congestive heart failure (HCC) 09/07/2019   Community acquired pneumonia 12/16/2019   Diabetes mellitus without complication (HCC)    History of COVID-19 11/07/2019   Migraines    Pregnancy induced hypertension    Pulmonary embolus (HCC) 12/16/2019   Pulmonary infarct (HCC) 12/17/2019    PAST SURGICAL HISTORY   Past Surgical History:  Procedure Laterality Date   CERVICAL BIOPSY  W/ LOOP ELECTRODE EXCISION  2009   Laparoscopic tubal sterilization with Falope rings.  2009   RIGHT/LEFT HEART CATH AND CORONARY ANGIOGRAPHY N/A 03/25/2019   Procedure: RIGHT/LEFT HEART CATH AND CORONARY ANGIOGRAPHY;  Surgeon: 05/25/2019, MD;  Location: [facility];  Service: Cardiovascular;  Laterality: N/A;   TUBAL LIGATION      There is no immunization history for the selected administration types on file for this patient.  MEDICATIONS/ALLERGIES   No outpatient medications have been marked as taking for the 02/20/22 encounter (Appointment) with 04/23/22, MD.    Allergies  Allergen Reactions   Ibuprofen Anaphylaxis    SOCIAL HISTORY/FAMILY HISTORY   Reviewed in Epic:   Social History   Tobacco Use  Smoking status: Never   Smokeless tobacco: Never  Vaping Use   Vaping Use: Never used  Substance Use Topics   Alcohol use: No    Alcohol/week: 6.0 standard drinks of alcohol    Types: 6 Shots of liquor per week   Drug use: No    Types: Marijuana   Social History   Social History Narrative   Not on file   Family History  Problem Relation Age of Onset   Hypertension Mother    Anesthesia problems Neg Hx     OBJCTIVE -PE, EKG, labs   Wt Readings from Last 3 Encounters:  01/14/22 124 lb 2 oz (56.3 kg)  01/08/22 135 lb 2 oz (61.3 kg)  12/31/21 133 lb 13.1 oz (60.7 kg)    Physical Exam: There were no vitals taken for this visit. Physical Exam   Adult ECG Report  Rate: *** ;  Rhythm: {rhythm:17366};   Narrative Interpretation:  ***  Recent Labs:  ***  Lab Results  Component Value Date   CHOL 135 07/10/2020   HDL 40 (L) 07/10/2020   LDLCALC 75 07/10/2020   TRIG 100 07/10/2020   CHOLHDL 3.4 07/10/2020   Lab Results  Component Value Date   CREATININE 0.52 01/14/2022   BUN 11 01/14/2022   NA 136 01/14/2022   K 3.6 01/14/2022   CL 102 01/14/2022   CO2 27 01/14/2022      Latest Ref Rng & Units 01/06/2022    9:54 AM 01/02/2022    6:16 AM 12/31/2021    5:50 AM  CBC  WBC 4.0 - 10.5 K/uL 13.4  10.9  14.4   Hemoglobin 12.0 - 15.0 g/dL 16.5  79.0  38.3   Hematocrit 36.0 - 46.0 % 38.5  38.1  34.7   Platelets 150 - 400 K/uL 475  337  262     Lab Results  Component Value Date   HGBA1C 15.1 (H) 12/31/2021   No results found for: "TSH"  ==================================================  COVID-19 Education: The signs and symptoms of COVID-19 were discussed with the patient and how to seek care for testing (follow up with PCP or arrange E-visit).    I spent a total of *** minutes with the patient spent in direct patient consultation.  Additional time spent with chart review  / charting (studies, outside notes, etc): *** min Total Time: *** min  Current medicines are reviewed at length with the patient today.  (+/- concerns) ***  This visit occurred during the SARS-CoV-2 public health emergency.  Safety protocols were in place, including screening questions prior to the visit, additional usage of staff PPE, and extensive cleaning of exam room while observing appropriate contact time as indicated for disinfecting solutions.  Notice: This dictation was prepared with Dragon dictation along with smart phrase technology. Any transcriptional errors that result from this process are unintentional and may not be corrected upon review.   Studies Ordered:  No orders of the defined types were placed in this encounter.  No orders of the defined types were placed in this encounter.   Patient Instructions /  Medication Changes & Studies & Tests Ordered   There are no Patient Instructions on file for this visit.    Bryan Lemma, M.D., M.S. Interventional Cardiologist   Pager # [phone] Phone # [phone] [address] [place], [place] [address]   Thank you for choosing [facility] [place]!!

## 2022-02-20 ENCOUNTER — Encounter: Payer: Self-pay | Admitting: Cardiology

## 2022-02-20 ENCOUNTER — Ambulatory Visit: Payer: Medicare Other | Admitting: Cardiology

## 2022-03-04 ENCOUNTER — Ambulatory Visit: Payer: Medicaid Other | Admitting: Family Medicine

## 2022-03-20 ENCOUNTER — Ambulatory Visit: Payer: Medicaid Other | Admitting: Family

## 2022-06-07 ENCOUNTER — Emergency Department
Admission: EM | Admit: 2022-06-07 | Discharge: 2022-06-07 | Disposition: A | Discharge status: Home / Self Care | Payer: Medicare Other | Attending: Emergency Medicine | Admitting: Emergency Medicine

## 2022-06-07 ENCOUNTER — Encounter: Payer: Self-pay | Admitting: Emergency Medicine

## 2022-06-07 ENCOUNTER — Emergency Department: Payer: Medicare Other

## 2022-06-07 ENCOUNTER — Other Ambulatory Visit: Payer: Self-pay

## 2022-06-07 DIAGNOSIS — I110 Hypertensive heart disease with heart failure: Secondary | ICD-10-CM | POA: Diagnosis not present

## 2022-06-07 DIAGNOSIS — I509 Heart failure, unspecified: Secondary | ICD-10-CM | POA: Diagnosis not present

## 2022-06-07 DIAGNOSIS — E119 Type 2 diabetes mellitus without complications: Secondary | ICD-10-CM | POA: Insufficient documentation

## 2022-06-07 DIAGNOSIS — R0602 Shortness of breath: Secondary | ICD-10-CM | POA: Diagnosis present

## 2022-06-07 LAB — CBC
HCT: 35.5 % — ABNORMAL LOW (ref 36.0–46.0)
Hemoglobin: 11.5 g/dL — ABNORMAL LOW (ref 12.0–15.0)
MCH: 30.2 pg (ref 26.0–34.0)
MCHC: 32.4 g/dL (ref 30.0–36.0)
MCV: 93.2 fL (ref 80.0–100.0)
Platelets: 362 10*3/uL (ref 150–400)
RBC: 3.81 MIL/uL — ABNORMAL LOW (ref 3.87–5.11)
RDW: 13.5 % (ref 11.5–15.5)
WBC: 9.5 10*3/uL (ref 4.0–10.5)
nRBC: 0 % (ref 0.0–0.2)

## 2022-06-07 LAB — BRAIN NATRIURETIC PEPTIDE: B Natriuretic Peptide: 875.8 pg/mL — ABNORMAL HIGH (ref 0.0–100.0)

## 2022-06-07 LAB — BASIC METABOLIC PANEL
Anion gap: 7 (ref 5–15)
BUN: 15 mg/dL (ref 6–20)
CO2: 28 mmol/L (ref 22–32)
Calcium: 7.9 mg/dL — ABNORMAL LOW (ref 8.9–10.3)
Chloride: 100 mmol/L (ref 98–111)
Creatinine, Ser: 0.76 mg/dL (ref 0.44–1.00)
GFR, Estimated: >60 mL/min (ref >60)
Glucose, Bld: 360 mg/dL — ABNORMAL HIGH (ref 70–99)
Potassium: 3.5 mmol/L (ref 3.5–5.1)
Sodium: 135 mmol/L (ref 135–145)

## 2022-06-07 LAB — TROPONIN I (HIGH SENSITIVITY)
Troponin I (High Sensitivity): 19 ng/L — ABNORMAL HIGH (ref <18)
Troponin I (High Sensitivity): 20 ng/L — ABNORMAL HIGH (ref <18)

## 2022-06-07 MED ORDER — FUROSEMIDE 10 MG/ML IJ SOLN
80.0000 mg | Freq: Once | INTRAMUSCULAR | Status: AC
  Administered 2022-06-07: 80 mg via INTRAVENOUS
  Filled 2022-06-07: qty 8

## 2022-06-07 NOTE — Discharge Instructions (Signed)
Continue take your normal medications as prescribed.  Follow-up in the CHF clinic.  Return to the ER immediately if you have new or worsening shortness of breath, weakness, chest pain, or any other new or worsening symptoms; you may also return at any time if you change your mind and wish to resume your care in the hospital.

## 2022-06-07 NOTE — ED Provider Notes (Signed)
[facility] Provider Note    Event Date/Time   First MD Initiated Contact with Patient 06/07/22 1127     (approximate)   History   Shortness of Breath   HPI  Cheryl Mercer is a 36 y.o. female with a history of CHF, dilated cardiomyopathy, pulmonary infarct, hypertension diabetes, and chronic back pain who presents with increased shortness of breath for approximately last week, gradual onset, intermittent, associated with orthopnea and with worsening bilateral leg swelling.  This is despite her being compliant with her torsemide and other medications.  She feels like she is fluid overloaded.  I reviewed the past medical records.  The patient was most recently admitted in May.  Per the hospitalist discharge summary she presented with productive cough and fever and was treated for sepsis and community-acquired pneumonia.   Physical Exam   Triage Vital Signs: ED Triage Vitals  Enc Vitals Group     BP 06/07/22 0953 (!) 150/115     Pulse Rate 06/07/22 0953 (!) 103     Resp 06/07/22 0953 20     Temp 06/07/22 0953 97.9 F (36.6 C)     Temp Source 06/07/22 0953 Oral     SpO2 06/07/22 0953 97 %     Weight 06/07/22 0958 144 lb 6.4 oz (65.5 kg)     Height 06/07/22 0950 4\' 11"  (1.499 m)     Head Circumference --      Peak Flow --      Pain Score 06/07/22 0950 8     Pain Loc --      Pain Edu? --      Excl. in [place]? --     Most recent vital signs: Vitals:   06/07/22 1400 06/07/22 1430  BP: (!) 175/113 (!) 159/112  Pulse: (!) 113 (!) 105  Resp: 20 18  Temp:    SpO2: 98% 95%     General: Awake, no distress.  CV:  Good peripheral perfusion.  Resp:  Normal effort.  Bilateral rales in the bases. Abd:  No distention.  Other:  2+ edema to bilateral lower extremities.   ED Results / Procedures / Treatments   Labs (all labs ordered are listed, but only abnormal results are displayed) Labs Reviewed  BASIC METABOLIC PANEL - Abnormal; Notable for  the following components:      Result Value   Glucose, Bld 360 (*)    Calcium 7.9 (*)    All other components within normal limits  CBC - Abnormal; Notable for the following components:   RBC 3.81 (*)    Hemoglobin 11.5 (*)    HCT 35.5 (*)    All other components within normal limits  BRAIN NATRIURETIC PEPTIDE - Abnormal; Notable for the following components:   B Natriuretic Peptide 875.8 (*)    All other components within normal limits  TROPONIN I (HIGH SENSITIVITY) - Abnormal; Notable for the following components:   Troponin I (High Sensitivity) 19 (*)    All other components within normal limits  TROPONIN I (HIGH SENSITIVITY) - Abnormal; Notable for the following components:   Troponin I (High Sensitivity) 20 (*)    All other components within normal limits  POC URINE PREG, ED     EKG  ED ECG REPORT I, Arta Silence, the attending physician, personally viewed and interpreted this ECG.  Date: 06/07/2022 EKG Time: 0951 Rate: 105 Rhythm: Sinus tachycardia QRS Axis: normal Intervals: Prolonged QTc ST/T Wave abnormalities: normal Narrative Interpretation: Nonspecific ST  abnormalities with no evidence of acute ischemia   RADIOLOGY  Chest x-ray: I independently viewed and interpreted the images; cardiomegaly with bilateral interstitial opacities  PROCEDURES:  Critical Care performed: No  Procedures   MEDICATIONS ORDERED IN ED: Medications  furosemide (LASIX) injection 80 mg (80 mg Intravenous Given 06/07/22 1211)     IMPRESSION / MDM / ASSESSMENT AND PLAN / ED COURSE  I reviewed the triage vital signs and the nursing notes.  36 year old female with history of CHF and other PMH as noted above presents with increased shortness of breath and bilateral lower extremity edema over the last week.  She feels similar to prior CHF exacerbations.  She states she has been compliant with her medications.  On exam there is significant peripheral edema and some rales to  the bases although O2 saturation is in the high 90s on room air and she does not demonstrate respiratory distress at rest.  Differential diagnosis includes, but is not limited to, CHF exacerbation, acute bronchitis, viral infection, less likely ACS.  There is no evidence of PE given the lack of any significant hypoxia or chest pain as well as the edema and abnormal breath sounds on exam.  Chest x-ray shows bilateral interstitial opacities consistent with CHF.  BNP is elevated.  Initial troponin is not significantly elevated.  I have ordered IV Lasix.  We will plan to observe the patient for a few hours in the ED and reassess.  Patient's presentation is most consistent with acute presentation with potential threat to life or bodily function.  The patient is on the cardiac monitor to evaluate for evidence of arrhythmia and/or significant heart rate changes.  ----------------------------------------- 3:15 PM on 06/07/2022 -----------------------------------------  Repeat troponin shows no change.  The patient has been borderline tachycardic throughout her ED stay but this has not worsened.  On reassessment she appears much more comfortable and is able to lie down.  She states that she has had significant urine output and is feeling somewhat better.  At this time, I feel it would be reasonable for the patient to be admitted for further IV Lasix and diuresis, however given that she is overall stable, is not requiring oxygen, and has no respiratory distress, discharge is reasonable as well.  I discussed this with the patient.  She initially elected to be admitted, however has now changed her mind and states she would like to go home.  I counseled her on the results of the work-up.  I gave her strict return precautions and she expressed understanding.  She understands she may return at any time if she has any new or worsening symptoms or changes her mind and wishes to resume her care in the  hospital.   FINAL CLINICAL IMPRESSION(S) / ED DIAGNOSES   Final diagnoses:  Acute on chronic congestive heart failure, unspecified heart failure type ([place])     Rx / DC Orders   ED Discharge Orders     None        Note:  This document was prepared using Dragon voice recognition software and may include unintentional dictation errors.   Arta Silence, MD 06/07/22 1517

## 2022-06-07 NOTE — ED Provider Triage Note (Signed)
Emergency Medicine Provider Triage Evaluation Note  Cheryl Mercer , a 36 y.o. female  was evaluated in triage.  Pt complains of shortness of breath and bilateral lower extremity edema with history of CHF.   Physical Exam  BP (!) 150/115 (BP Location: Left Arm)   Pulse (!) 103   Temp 97.9 F (36.6 C) (Oral)   Resp 20   Ht 4\' 11"  (1.499 m)   LMP 04/14/2022   SpO2 97%   BMI 25.07 kg/m  Gen:   Awake, no distress   Resp:  Normal effort  MSK:   Moves extremities without difficulty  Other:    Medical Decision Making  Medically screening exam initiated at 9:55 AM.  Appropriate orders placed.  Cheryl Mercer was informed that the remainder of the evaluation will be completed by another provider, this initial triage assessment does not replace that evaluation, and the importance of remaining in the ED until their evaluation is complete.    Victorino Dike, FNP 06/07/22 1049

## 2022-06-07 NOTE — ED Triage Notes (Signed)
C/O lower extremity swelling, worse yesterday. STates today awoke and swelling worse and also SOB.  Has history of CHF, states has all regular medications at home.

## 2022-06-12 ENCOUNTER — Encounter: Payer: Self-pay | Admitting: Emergency Medicine

## 2022-06-12 ENCOUNTER — Other Ambulatory Visit: Payer: Self-pay

## 2022-06-12 ENCOUNTER — Inpatient Hospital Stay: Admit: 2022-06-12 | Payer: Medicare Other

## 2022-06-12 ENCOUNTER — Emergency Department: Payer: Medicare Other

## 2022-06-12 ENCOUNTER — Inpatient Hospital Stay
Admission: EM | Admit: 2022-06-12 | Discharge: 2022-06-15 | DRG: 291 | Disposition: A | Discharge status: Home / Self Care | Payer: Medicare Other | Attending: Internal Medicine | Admitting: Internal Medicine

## 2022-06-12 DIAGNOSIS — Z7984 Long term (current) use of oral hypoglycemic drugs: Secondary | ICD-10-CM | POA: Diagnosis not present

## 2022-06-12 DIAGNOSIS — E1165 Type 2 diabetes mellitus with hyperglycemia: Secondary | ICD-10-CM | POA: Diagnosis present

## 2022-06-12 DIAGNOSIS — E669 Obesity, unspecified: Secondary | ICD-10-CM | POA: Diagnosis present

## 2022-06-12 DIAGNOSIS — E876 Hypokalemia: Secondary | ICD-10-CM | POA: Diagnosis present

## 2022-06-12 DIAGNOSIS — R0601 Orthopnea: Secondary | ICD-10-CM | POA: Diagnosis present

## 2022-06-12 DIAGNOSIS — Z91148 Patient's other noncompliance with medication regimen for other reason: Secondary | ICD-10-CM | POA: Diagnosis not present

## 2022-06-12 DIAGNOSIS — I959 Hypotension, unspecified: Secondary | ICD-10-CM | POA: Diagnosis not present

## 2022-06-12 DIAGNOSIS — Z8249 Family history of ischemic heart disease and other diseases of the circulatory system: Secondary | ICD-10-CM | POA: Diagnosis not present

## 2022-06-12 DIAGNOSIS — Z794 Long term (current) use of insulin: Secondary | ICD-10-CM | POA: Diagnosis not present

## 2022-06-12 DIAGNOSIS — I5023 Acute on chronic systolic (congestive) heart failure: Secondary | ICD-10-CM | POA: Diagnosis present

## 2022-06-12 DIAGNOSIS — E1140 Type 2 diabetes mellitus with diabetic neuropathy, unspecified: Secondary | ICD-10-CM

## 2022-06-12 DIAGNOSIS — Z79899 Other long term (current) drug therapy: Secondary | ICD-10-CM

## 2022-06-12 DIAGNOSIS — I509 Heart failure, unspecified: Secondary | ICD-10-CM

## 2022-06-12 DIAGNOSIS — R0609 Other forms of dyspnea: Secondary | ICD-10-CM

## 2022-06-12 DIAGNOSIS — I428 Other cardiomyopathies: Secondary | ICD-10-CM | POA: Diagnosis present

## 2022-06-12 DIAGNOSIS — E1142 Type 2 diabetes mellitus with diabetic polyneuropathy: Secondary | ICD-10-CM | POA: Diagnosis present

## 2022-06-12 DIAGNOSIS — E785 Hyperlipidemia, unspecified: Secondary | ICD-10-CM | POA: Diagnosis present

## 2022-06-12 DIAGNOSIS — I5043 Acute on chronic combined systolic (congestive) and diastolic (congestive) heart failure: Secondary | ICD-10-CM

## 2022-06-12 DIAGNOSIS — Z6830 Body mass index (BMI) 30.0-30.9, adult: Secondary | ICD-10-CM

## 2022-06-12 DIAGNOSIS — R600 Localized edema: Principal | ICD-10-CM

## 2022-06-12 DIAGNOSIS — E119 Type 2 diabetes mellitus without complications: Secondary | ICD-10-CM

## 2022-06-12 DIAGNOSIS — Z23 Encounter for immunization: Secondary | ICD-10-CM

## 2022-06-12 DIAGNOSIS — I2489 Other forms of acute ischemic heart disease: Secondary | ICD-10-CM | POA: Diagnosis present

## 2022-06-12 DIAGNOSIS — Z8616 Personal history of COVID-19: Secondary | ICD-10-CM | POA: Diagnosis not present

## 2022-06-12 DIAGNOSIS — I110 Hypertensive heart disease with heart failure: Secondary | ICD-10-CM | POA: Diagnosis present

## 2022-06-12 DIAGNOSIS — Z86711 Personal history of pulmonary embolism: Secondary | ICD-10-CM

## 2022-06-12 HISTORY — DX: Polyneuropathy, unspecified: G62.9

## 2022-06-12 LAB — BASIC METABOLIC PANEL
Anion gap: 10 (ref 5–15)
BUN: 12 mg/dL (ref 6–20)
CO2: 28 mmol/L (ref 22–32)
Calcium: 8.1 mg/dL — ABNORMAL LOW (ref 8.9–10.3)
Chloride: 99 mmol/L (ref 98–111)
Creatinine, Ser: 0.69 mg/dL (ref 0.44–1.00)
GFR, Estimated: >60 mL/min (ref >60)
Glucose, Bld: 437 mg/dL — ABNORMAL HIGH (ref 70–99)
Potassium: 2.9 mmol/L — ABNORMAL LOW (ref 3.5–5.1)
Sodium: 137 mmol/L (ref 135–145)

## 2022-06-12 LAB — GLUCOSE, CAPILLARY
Glucose-Capillary: 254 mg/dL — ABNORMAL HIGH (ref 70–99)
Glucose-Capillary: 138 mg/dL — ABNORMAL HIGH (ref 70–99)

## 2022-06-12 LAB — BRAIN NATRIURETIC PEPTIDE: B Natriuretic Peptide: 867.5 pg/mL — ABNORMAL HIGH (ref 0.0–100.0)

## 2022-06-12 LAB — CBC
HCT: 34.7 % — ABNORMAL LOW (ref 36.0–46.0)
Hemoglobin: 11.4 g/dL — ABNORMAL LOW (ref 12.0–15.0)
MCH: 30.5 pg (ref 26.0–34.0)
MCHC: 32.9 g/dL (ref 30.0–36.0)
MCV: 92.8 fL (ref 80.0–100.0)
Platelets: 365 10*3/uL (ref 150–400)
RBC: 3.74 MIL/uL — ABNORMAL LOW (ref 3.87–5.11)
RDW: 13.1 % (ref 11.5–15.5)
WBC: 9.1 10*3/uL (ref 4.0–10.5)
nRBC: 0 % (ref 0.0–0.2)

## 2022-06-12 LAB — TROPONIN I (HIGH SENSITIVITY): Troponin I (High Sensitivity): 20 ng/L — ABNORMAL HIGH (ref <18)

## 2022-06-12 MED ORDER — ONDANSETRON HCL 4 MG/2ML IJ SOLN: 4.0000 mg | Freq: Four times a day (QID) | INTRAMUSCULAR | Status: DC | PRN

## 2022-06-12 MED ORDER — ENOXAPARIN SODIUM 40 MG/0.4ML IJ SOSY: 0.5000 mg/kg | PREFILLED_SYRINGE | INTRAMUSCULAR | Status: DC

## 2022-06-12 MED ORDER — SODIUM CHLORIDE 0.9 % IV SOLN: 250.0000 mL | INTRAVENOUS | Status: DC | PRN

## 2022-06-12 MED ORDER — ENOXAPARIN SODIUM 40 MG/0.4ML IJ SOSY
40.0000 mg | PREFILLED_SYRINGE | INTRAMUSCULAR | Status: DC
  Administered 2022-06-12 – 2022-06-14 (×3): 40 mg via SUBCUTANEOUS
  Filled 2022-06-12 (×3): qty 0.4

## 2022-06-12 MED ORDER — INSULIN GLARGINE-YFGN 100 UNIT/ML ~~LOC~~ SOLN
40.0000 [IU] | Freq: Every day | SUBCUTANEOUS | Status: DC
  Administered 2022-06-12 – 2022-06-15 (×4): 40 [IU] via SUBCUTANEOUS
  Filled 2022-06-12 (×4): qty 0.4

## 2022-06-12 MED ORDER — FUROSEMIDE 10 MG/ML IJ SOLN
80.0000 mg | Freq: Once | INTRAMUSCULAR | Status: AC
  Administered 2022-06-12: 80 mg via INTRAVENOUS
  Filled 2022-06-12: qty 8

## 2022-06-12 MED ORDER — INFLUENZA VAC SPLIT QUAD 0.5 ML IM SUSY
0.5000 mL | PREFILLED_SYRINGE | INTRAMUSCULAR | Status: AC
  Administered 2022-06-13: 0.5 mL via INTRAMUSCULAR
  Filled 2022-06-12: qty 0.5

## 2022-06-12 MED ORDER — POTASSIUM CHLORIDE 20 MEQ PO PACK
40.0000 meq | PACK | Freq: Two times a day (BID) | ORAL | Status: DC
  Administered 2022-06-12 (×2): 40 meq via ORAL
  Filled 2022-06-12 (×3): qty 2

## 2022-06-12 MED ORDER — ATORVASTATIN CALCIUM 20 MG PO TABS
40.0000 mg | ORAL_TABLET | Freq: Every day | ORAL | Status: DC
  Administered 2022-06-12 – 2022-06-14 (×3): 40 mg via ORAL
  Filled 2022-06-12 (×3): qty 2

## 2022-06-12 MED ORDER — POTASSIUM CHLORIDE 10 MEQ/100ML IV SOLN
10.0000 meq | INTRAVENOUS | Status: AC
  Administered 2022-06-12: 10 meq via INTRAVENOUS
  Filled 2022-06-12 (×2): qty 100

## 2022-06-12 MED ORDER — CARVEDILOL 6.25 MG PO TABS
6.2500 mg | ORAL_TABLET | Freq: Two times a day (BID) | ORAL | Status: DC
  Administered 2022-06-12 – 2022-06-14 (×4): 6.25 mg via ORAL
  Filled 2022-06-12 (×4): qty 1

## 2022-06-12 MED ORDER — SACUBITRIL-VALSARTAN 24-26 MG PO TABS
1.0000 | ORAL_TABLET | Freq: Two times a day (BID) | ORAL | Status: DC
  Administered 2022-06-12 – 2022-06-14 (×5): 1 via ORAL
  Filled 2022-06-12 (×5): qty 1

## 2022-06-12 MED ORDER — SODIUM CHLORIDE 0.9% FLUSH: 3.0000 mL | INTRAVENOUS | Status: DC | PRN

## 2022-06-12 MED ORDER — IBUPROFEN 400 MG PO TABS: 800.0000 mg | ORAL_TABLET | Freq: Three times a day (TID) | ORAL | Status: DC | PRN

## 2022-06-12 MED ORDER — SPIRONOLACTONE 25 MG PO TABS
25.0000 mg | ORAL_TABLET | Freq: Every day | ORAL | Status: DC
  Administered 2022-06-12 – 2022-06-14 (×3): 25 mg via ORAL
  Filled 2022-06-12 (×3): qty 1

## 2022-06-12 MED ORDER — ACETAMINOPHEN 325 MG PO TABS
650.0000 mg | ORAL_TABLET | ORAL | Status: DC | PRN
  Administered 2022-06-12 – 2022-06-14 (×3): 650 mg via ORAL
  Filled 2022-06-12 (×3): qty 2

## 2022-06-12 MED ORDER — POTASSIUM CHLORIDE CRYS ER 20 MEQ PO TBCR
20.0000 meq | EXTENDED_RELEASE_TABLET | Freq: Every day | ORAL | Status: DC
  Administered 2022-06-12: 20 meq via ORAL
  Filled 2022-06-12: qty 1

## 2022-06-12 MED ORDER — FUROSEMIDE 10 MG/ML IJ SOLN
40.0000 mg | Freq: Four times a day (QID) | INTRAMUSCULAR | Status: DC
  Administered 2022-06-12 – 2022-06-13 (×4): 40 mg via INTRAVENOUS
  Filled 2022-06-12 (×4): qty 4

## 2022-06-12 MED ORDER — EMPAGLIFLOZIN 10 MG PO TABS
10.0000 mg | ORAL_TABLET | Freq: Every day | ORAL | Status: DC
  Administered 2022-06-13 – 2022-06-15 (×3): 10 mg via ORAL
  Filled 2022-06-12 (×3): qty 1

## 2022-06-12 MED ORDER — ZOLPIDEM TARTRATE 5 MG PO TABS: 5.0000 mg | ORAL_TABLET | Freq: Every evening | ORAL | Status: DC | PRN

## 2022-06-12 MED ORDER — LABETALOL HCL 5 MG/ML IV SOLN
10.0000 mg | Freq: Once | INTRAVENOUS | Status: AC
  Administered 2022-06-12: 10 mg via INTRAVENOUS
  Filled 2022-06-12: qty 4

## 2022-06-12 MED ORDER — SODIUM CHLORIDE 0.9% FLUSH
3.0000 mL | Freq: Two times a day (BID) | INTRAVENOUS | Status: DC
  Administered 2022-06-12 – 2022-06-15 (×6): 3 mL via INTRAVENOUS

## 2022-06-12 MED ORDER — INSULIN ASPART 100 UNIT/ML IJ SOLN
0.0000 [IU] | Freq: Three times a day (TID) | INTRAMUSCULAR | Status: DC
  Administered 2022-06-12: 8 [IU] via SUBCUTANEOUS
  Administered 2022-06-13: 2 [IU] via SUBCUTANEOUS
  Administered 2022-06-13 (×2): 3 [IU] via SUBCUTANEOUS
  Administered 2022-06-14: 8 [IU] via SUBCUTANEOUS
  Administered 2022-06-14: 2 [IU] via SUBCUTANEOUS
  Administered 2022-06-14 – 2022-06-15 (×2): 5 [IU] via SUBCUTANEOUS
  Filled 2022-06-12 (×8): qty 1

## 2022-06-12 MED ORDER — ACETAMINOPHEN 325 MG PO TABS
650.0000 mg | ORAL_TABLET | Freq: Once | ORAL | Status: AC
  Administered 2022-06-12: 650 mg via ORAL
  Filled 2022-06-12: qty 2

## 2022-06-12 NOTE — ED Triage Notes (Signed)
Patient ambulatory to triage with steady gait, without difficulty or distress noted; pt reports having increased LE swelling with SHOB; hx CHF

## 2022-06-12 NOTE — Consult Note (Signed)
[facility] CARDIOLOGY CONSULT NOTE       Patient ID: Cheryl Mercer MRN: [id number] DOB/AGE: [birth year] 36 y.o.  Admit date: 06/12/2022 Referring Physician Dr. Illene Regulus Primary Physician none Primary Cardiologist none Reason for Consultation AoCHF  HPI: Cheryl Mercer is a 35yoF with a PMH of NICM (25-30%, global hypo, mod-sev dilated LV, G3 DD, mod reduced RVEF, mild MR, biatrial dilation 12/2021), poorly controlled insulin dependent DM (A1c 15% 12/2021), hyperlipidemia, history of PE (not on [facility]), who presented to [facility] ED 06/12/2022 with worsening lower extremity edema, orthopnea, dyspnea on exertion similar to her prior HF exacerbations x 2 weeks for which cardiology is consulted for further assistance.  She initially presented to [facility] ED on 06/07/2022 with shortness of breath for 1 week with progressive orthopnea and bilateral leg swelling and felt like she was fluid overloaded.  She was diuresed with IV Lasix with good response and clinical improvement and admission was considered for further diuresis, but the patient elected to go home.  She presents today 10/26 with progressively worsening peripheral edema and orthopnea over the past 2 weeks.  When questioned she openly admits to only taking her cardiac medicines twice weekly for no other reason other than "being tired of taking them every day."  She admits to compliance with her insulin.  She has not seen a cardiologist on an outpatient basis and many years, last saw Clarisa Kindred at the heart failure clinic in May of this year for 1 visit.  At my time of evaluation she says she is very tired and needs some rest, has significant discomfort that brings her to tears from her right IV site where the potassium is being infused.  She feels as though her peripheral edema and orthopnea have not changed after she received IV Lasix 80 mg x 1 in the ED.  She denies chest pain, shortness of breath at rest, palpitations or other  complaints.  She denies history of tobacco use, she denies any alcohol for the past month and a half.  When asked further about her regular alcohol use, she says she drinks beer "on the weekends, probably more than usual" and does not give me specifics.  Denies illicit substance use.  Vitals are notable for initial significant hypertension at 165/109, with improvement to 135/97 most recently, she is in sinus rhythm with heart rates in the 90s.  Labs are notable for hypokalemia with potassium 2.9, elevated blood glucose at 437, BUN/creatinine 12/0.69 and GFR greater than 60.  Elevated BNP similar to 5 days ago at 867.5.  High-sensitivity troponin minimally elevated at 20, similar to 19-20 when she was in the ER 5 days ago.  She has no leukocytosis with WBCs 9.1, H&H stable at 11.4/34.7, platelets 396.  Her chest x-ray shows stable cardiomegaly with bilateral mild interstitial thickening which was thought to may reflect underlying chronic interstitial disease versus superimposed interstitial infection or edema.   Review of systems complete and found to be negative unless listed above     Past Medical History:  Diagnosis Date   Chronic back pain    Chronic combined systolic and diastolic congestive heart failure (HCC) 09/07/2019   Diabetes mellitus without complication (HCC)    History of COVID-19 11/07/2019   Migraines    Pregnancy induced hypertension    Pulmonary embolus (HCC) 12/16/2019   Pulmonary infarct (HCC) 12/17/2019    Past Surgical History:  Procedure Laterality Date   CERVICAL BIOPSY  W/ LOOP ELECTRODE EXCISION  2009  Laparoscopic tubal sterilization with Falope rings.  2009   RIGHT/LEFT HEART CATH AND CORONARY ANGIOGRAPHY N/A 03/25/2019   Procedure: RIGHT/LEFT HEART CATH AND CORONARY ANGIOGRAPHY;  Surgeon: Dolores Patty, MD;  Location: [facility] INVASIVE CV LAB;  Service: Cardiovascular;  Laterality: N/A;   TUBAL LIGATION      (Not in a hospital admission)  Social History    Socioeconomic History   Marital status: Married    Spouse name: Not on file   Number of children: 4   Years of education: Not on file   Highest education level: Not on file  Occupational History   Not on file  Tobacco Use   Smoking status: Never   Smokeless tobacco: Never  Vaping Use   Vaping Use: Never used  Substance and Sexual Activity   Alcohol use: No    Alcohol/week: 6.0 standard drinks of alcohol    Types: 6 Shots of liquor per week   Drug use: No    Types: Marijuana   Sexual activity: Not Currently    Birth control/protection: Surgical    Comment: band placed around tubes in 2009 surgically by Dr. Emelda Fear  Other Topics Concern   Not on file  Social History Narrative   Not on file   Social Determinants of Health   Financial Resource Strain: Medium Risk (04/07/2019)   Overall Financial Resource Strain (CARDIA)    Difficulty of Paying Living Expenses: Somewhat hard  Food Insecurity: Food Insecurity Present (04/07/2019)   Hunger Vital Sign    Worried About Running Out of Food in the Last Year: Sometimes true    Ran Out of Food in the Last Year: Never true  Transportation Needs: No Transportation Needs (04/07/2019)   PRAPARE - Administrator, Civil Service (Medical): No    Lack of Transportation (Non-Medical): No  Physical Activity: Not on file  Stress: Not on file  Social Connections: Not on file  Intimate Partner Violence: Not on file    Family History  Problem Relation Age of Onset   Hypertension Mother    Anesthesia problems Neg Hx      Vitals:   06/12/22 0623 06/12/22 0753 06/12/22 1130 06/12/22 1243  BP: (!) 154/130 (!) 165/109 (!) 135/97   Pulse: (!) 111 (!) 109 91   Resp: (!) 22 17 15    Temp: 97.9 F (36.6 C) (!) 97.5 F (36.4 C)  (!) 97.5 F (36.4 C)  TempSrc: Oral Oral  Oral  SpO2: 100% 98% 98%   Weight:      Height:        PHYSICAL EXAM General: Young Hispanic female lying at incline in hospital bed, who she refers to her  "baby daddy" at bedside, well nourished, appears to be very uncomfortable with IV potassium infusion in her right AC. HEENT:  Normocephalic and atraumatic. Neck:  No JVD.  Lungs: Normal respiratory effort on room air. Clear bilaterally to auscultation. No wheezes, crackles, rhonchi.  Heart: HRRR . Normal S1 and S2 without gallops or murmurs.  Abdomen: Non-distended appearing.  Msk: Normal strength and tone for age. Extremities: Warm and well perfused. No clubbing, cyanosis.  Generally edematous bilateral lower extremities without pitting.  Neuro: Alert and oriented X 3. Psych:  Answers questions appropriately.   Labs: Basic Metabolic Panel: Recent Labs    06/12/22 0631  NA 137  K 2.9*  CL 99  CO2 28  GLUCOSE 437*  BUN 12  CREATININE 0.69  CALCIUM 8.1*   Liver Function Tests:  No results for input(s): "AST", "ALT", "ALKPHOS", "BILITOT", "PROT", "ALBUMIN" in the last 72 hours. No results for input(s): "LIPASE", "AMYLASE" in the last 72 hours. CBC: Recent Labs    06/12/22 0631  WBC 9.1  HGB 11.4*  HCT 34.7*  MCV 92.8  PLT 365   Cardiac Enzymes: Recent Labs    06/12/22 0631  TROPONINIHS 20*   BNP: Recent Labs    06/12/22 0631  BNP 867.5*   D-Dimer: No results for input(s): "DDIMER" in the last 72 hours. Hemoglobin A1C: No results for input(s): "HGBA1C" in the last 72 hours. Fasting Lipid Panel: No results for input(s): "CHOL", "HDL", "LDLCALC", "TRIG", "CHOLHDL", "LDLDIRECT" in the last 72 hours. Thyroid Function Tests: No results for input(s): "TSH", "T4TOTAL", "T3FREE", "THYROIDAB" in the last 72 hours.  Invalid input(s): "FREET3" Anemia Panel: No results for input(s): "VITAMINB12", "FOLATE", "FERRITIN", "TIBC", "IRON", "RETICCTPCT" in the last 72 hours.   Radiology: [facility] Chest Port 1 View  Result Date: 06/12/2022 CLINICAL DATA:  Increased bilateral leg swelling EXAM: PORTABLE CHEST 1 VIEW COMPARISON:  06/07/2022 FINDINGS: Bilateral diffuse mild  interstitial thickening. No pleural effusion or pneumothorax. No focal consolidation. Stable mild cardiomegaly. No acute osseous abnormality. IMPRESSION: 1. Stable cardiomegaly. 2. Bilateral mild interstitial thickening which may reflect underlying chronic interstitial disease versus superimposed interstitial infection or mild interstitial edema. Electronically Signed   By: Elige Ko M.D.   On: 06/12/2022 08:24   DG Chest 2 View  Result Date: 06/07/2022 CLINICAL DATA:  Shortness of breath, lower extremity swelling, history of CHF. EXAM: CHEST - 2 VIEW COMPARISON:  Chest x-rays dated 01/06/2022, 12/31/2021 and 06/13/2021. FINDINGS: Stable cardiomegaly. Persistent reticulonodular opacities bilaterally, basilar predominant, similar to the appearance/distribution on chest CT of 12/31/2021. No new lung findings. No pleural effusion or pneumothorax is seen. Osseous structures about the chest are unremarkable. IMPRESSION: 1. Stable cardiomegaly. 2. Persistent reticulonodular opacities bilaterally, basilar predominant, similar to the appearance/distribution on previous chest x-rays and the chest CT angiogram of 12/31/2021, compatible with chronic interstitial lung disease or atypical waxing and waning infectious process as suggested on the chest CT angiogram report. 3. No new findings. No evidence of a superimposed consolidating pneumonia. Electronically Signed   By: Bary Richard M.D.   On: 06/07/2022 10:15    ECHO 01/01/2022  1. Left ventricular ejection fraction, by estimation, is 25 to 30%. The  left ventricle has severely decreased function. The left ventricle  demonstrates global hypokinesis. The left ventricular internal cavity size  was moderately to severely dilated.  Left ventricular diastolic parameters are consistent with Grade III  diastolic dysfunction (restrictive).   2. Right ventricular systolic function is moderately reduced. The right  ventricular size is moderately enlarged. Moderately  increased right  ventricular wall thickness.   3. Left atrial size was mildly dilated.   4. Right atrial size was mildly dilated.   5. The mitral valve is grossly normal. Mild mitral valve regurgitation.   6. The aortic valve is grossly normal. Aortic valve regurgitation is not  visualized. Aortic valve sclerosis is present, with no evidence of aortic  valve stenosis.   R/LHC 03/2019 Findings:   Ao = 122/88 (104) LV = 125/15 RA = 2 RV = 35/6 PA = 37/14 (25) PCW = 18 Fick cardiac output/index = 5.3/3.2 SVR = 1543 PVR = 1.3 WU Ao sat = 99% PA sat = 74%, 76%   Assessment: 1. Normal coronary arteries 2. 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 Check cMRI.    Glori Bickers, MD  9:37 AM  TELEMETRY reviewed by me (LT) 06/12/2022 : None available for review  EKG reviewed by me: Sinus tachycardia rate 110, nonspecific T wave abnormalities similar to prior from 10/21 and 5/22  Data reviewed by me (LT) 06/12/2022: ED note, admission H&P, ED note from 10/21, CBC, BMP, troponins, BNP, I's and O's, vitals  Principal Problem:   Acute on chronic combined systolic (congestive) and diastolic (congestive) heart failure (HCC) Active Problems:   Uncontrolled type 2 diabetes mellitus with hyperglycemia, with long-term current use of insulin (HCC)   Hypokalemia    ASSESSMENT AND PLAN:  Cheryl Mercer is a 78yoF with a PMH of NICM (25-30%, global hypo, mod-sev dilated LV, G3 DD, mod reduced RVEF, mild MR, biatrial dilation 12/2021), poorly controlled insulin dependent DM (A1c 15% 12/2021), hyperlipidemia, history of PE (not on [facility]), who presented to [facility] ED 06/12/2022 with worsening lower extremity edema, orthopnea, dyspnea on exertion similar to her prior HF exacerbations for which cardiology was consulted for further assistance.  #Acute on chronic HFrEF (25-30%) / NICM Presents with a several week  history of worsening peripheral edema, orthopnea, and dyspnea on exertion.  Admits to noncompliance with her home medications, only taking them twice weekly stating "she just does not like taking them." BNP elevated to 860 and appears volume overloaded in her lower extremities on exam.  The etiology of her cardiomyopathy was felt to be HTN related, exacerbated by medication noncompliance and alcohol use.  She had normal coronaries by [facility] in 2020. -S/p IV Lasix 80 mg x 1, okay with IV Lasix 40 mg every 6 hours -Continue daily potassium replenishment, prefers K+ packets -Continue GDMT with carvedilol 6.25 mg twice daily, Entresto 24-26 twice daily, Jardiance 10 mg daily, spironolactone 25 mg daily -Echocardiogram complete. Her EF has improved from 10-15% (2020) to 25-30% (2023) with GDMT, she unfortunately has not had regular outpatient cardiology follow-up, only infrequent visits to the heart failure clinic.  -Reinforced the importance of compliance with her medications in addition to outpatient cardiology follow-up.  #Poorly controlled diabetes A1c 15% in May 2023.  Says she is compliant with her insulin at home, recheck A1c tomorrow morning  #Demand supply ischemia Troponin minimally elevated at 20 most consistent with demand/supply mismatch from her HF exacerbation and not ACS   This patient's plan of care was discussed and created with Dr. Nehemiah Massed and he is in agreement.  Signed: Tristan Schroeder , PA-C 06/12/2022, 12:44 PM [facility]

## 2022-06-12 NOTE — ED Notes (Signed)
Informed RN bed assigned 

## 2022-06-12 NOTE — Assessment & Plan Note (Deleted)
Change Lasix to 40 mg IV twice daily.  Coreg and Entresto, spironolactone and Jardiance prescribed.  Compliance with medications explained.  EF less than 20%.  Will need close clinical follow-up with cardiology as outpatient.

## 2022-06-12 NOTE — ED Provider Notes (Signed)
Patient received 80 of Lasix, has been diuresing however still markedly tachycardic, still feels short of breath as well.  Feel she will require admission for further diuresis given that she failed outpatient management   Cheryl Drafts, MD 06/12/22 [phone]

## 2022-06-12 NOTE — ED Provider Notes (Signed)
[facility] Provider Note    Event Date/Time   First MD Initiated Contact with Patient 06/12/22 [phone]     (approximate)   History   Leg Swelling   HPI  BRIONA KORPELA is a 36 y.o. female   Past medical history of CHF, PE, diabetes who presents to the emergency department with gradually worsening bilateral lower extremity edema as well as orthopnea, exertional dyspnea that is consistent with her prior CHF exacerbations.  She was seen on October 21 with similar symptoms and was diuresed with 80 IV Lasix in the emergency department with resolution of symptoms, has been taking torsemide but has not been able to follow-up with her cardiologist and has had gradual recurrence of symptoms.  Denies chest pain, cough, fever.  History was obtained via the patient and a review of external medical notes including emergency department visit dated 06/07/2020      Physical Exam   Triage Vital Signs: ED Triage Vitals  Enc Vitals Group     BP 06/12/22 0623 (!) 154/130     Pulse Rate 06/12/22 0623 (!) 111     Resp 06/12/22 0623 (!) 22     Temp 06/12/22 0623 97.9 F (36.6 C)     Temp Source 06/12/22 0623 Oral     SpO2 06/12/22 0623 100 %     Weight 06/12/22 0614 144 lb (65.3 kg)     Height 06/12/22 0614 4\' 9"  (1.448 m)     Head Circumference --      Peak Flow --      Pain Score 06/12/22 0613 8     Pain Loc --      Pain Edu? --      Excl. in GC? --     Most recent vital signs: Vitals:   06/12/22 0623  BP: (!) 154/130  Pulse: (!) 111  Resp: (!) 22  Temp: 97.9 F (36.6 C)  SpO2: 100%    General: Awake, no distress.  CV:  Good peripheral perfusion.  Resp:  Normal effort.  Bilateral rales at the bases Abd:  No distention.  Other:  Bilateral lower extremity edema   ED Results / Procedures / Treatments   Labs (all labs ordered are listed, but only abnormal results are displayed) Labs Reviewed  CBC - Abnormal; Notable for the following  components:      Result Value   RBC 3.74 (*)    Hemoglobin 11.4 (*)    HCT 34.7 (*)    All other components within normal limits  BASIC METABOLIC PANEL  BRAIN NATRIURETIC PEPTIDE  TROPONIN I (HIGH SENSITIVITY)     I reviewed labs and they are notable for normal white blood cell count 9.1.  EKG  ED ECG REPORT I, 06/14/22, the attending physician, personally viewed and interpreted this ECG.   Date: 06/12/2022  EKG Time: 0625  Rate: 110  Rhythm: normal EKG, normal sinus rhythm  Intervals:none  ST&T Change: no ischemic changes    RADIOLOGY I independently reviewed and interpreted this x-ray and see bilateral opacities diffusely consistent w pulmonary edema   PROCEDURES:  Critical Care performed: No  Procedures   MEDICATIONS ORDERED IN ED: Medications  furosemide (LASIX) injection 80 mg (has no administration in time range)     IMPRESSION / MDM / ASSESSMENT AND PLAN / ED COURSE  I reviewed the triage vital signs and the nursing notes.  Differential diagnosis includes, but is not limited to, HF exacerbation, ACS, PE, respiratory infection   The patient is on the cardiac monitor to evaluate for evidence of arrhythmia and/or significant heart rate changes.  MDM: Patient with clinical evidence of fluid overload with rales and bilateral lower extremity edema orthopnea and exertional dyspnea with a history of CHF that got better with diuresis a few days ago.  I considered ACS or PE but I think this is less likely given no chest pain and clinical evidence of another diagnosis.  Given 80 IV Lasix, assess for effects, check labs including troponins, and advised the patient she will need close follow-up with her cardiologist to review her diuretics if she is discharged from the emergency department today.  Patient's presentation is most consistent with acute presentation with potential threat to life or bodily function.       FINAL  CLINICAL IMPRESSION(S) / ED DIAGNOSES   Final diagnoses:  Bilateral lower extremity edema  Exertional dyspnea  Orthopnea     Rx / DC Orders   ED Discharge Orders     None        Note:  This document was prepared using Dragon voice recognition software and may include unintentional dictation errors.    Lucillie Garfinkel, MD 06/12/22 0730

## 2022-06-12 NOTE — Subjective & Objective (Signed)
Cheryl Mercer is a 36 y.o. femal history of chronic combined HF with last ECHO 01/01/22 with EF 25-30%, Grade III DDD, PE, diabetes who presents to the emergency department with gradually worsening bilateral lower extremity edema as well as orthopnea, exertional dyspnea that is consistent with her prior CHF exacerbations.  She was seen on October 21 with similar symptoms and was diuresed with 80 IV Lasix in the emergency department with resolution of symptoms, has been taking torsemide as an oujtpatinent but has not been able to follow-up with her cardiologist and is not in heart failure clinic. She has had gradual recurrence of symptoms. Denies chest pain, cough, fever.

## 2022-06-12 NOTE — H&P (Signed)
History and Physical    Cheryl Mercer DGL:[id number] DOB: 03-26-1986 DOA: 06/12/2022  DOS: the patient was seen and examined on 06/12/2022  PCP: [facility], [facility], MD   Patient coming from: Home  I have personally briefly reviewed patient's old medical records in [place] is a 36 y.o. femal history of chronic combined HF with last ECHO 01/01/22 with EF 25-30%, Grade III DDD, PE, diabetes who presents to the emergency department with gradually worsening bilateral lower extremity edema as well as orthopnea, exertional dyspnea that is consistent with her prior CHF exacerbations.  She was seen on October 21 with similar symptoms and was diuresed with 80 IV Lasix in the emergency department with resolution of symptoms, has been taking torsemide as an oujtpatinent but has not been able to follow-up with her cardiologist and is not in heart failure clinic. She has had gradual recurrence of symptoms. Denies chest pain, cough, fever.   ED Course: Afebrile, 165/109  HR 109  17. EDP exam notable for nl respiratory effort, peripheral edema noted. Lab: Glucose 437, Anion Gap 10, CBC nl, BNP 867.5, Troponins 19 - 20. CXR - cardiomegaly, increased interstial thickening c/w mild CHF. EXK - sinus tachycardia. Patient received Lasix 80 mg IV in ED. TRH called to admit for continued mgt acute on chronic combined HF and diabetes  Review of Systems:  Review of Systems  Constitutional:  Negative for chills, fever and weight loss.  HENT: Negative.    Eyes: Negative.   Respiratory:  Positive for shortness of breath.   Cardiovascular:  Positive for chest pain and leg swelling. Negative for palpitations.  Gastrointestinal:  Positive for abdominal pain, nausea and vomiting.  Genitourinary: Negative.   Musculoskeletal: Negative.   Skin: Negative.   Neurological:  Positive for tingling and sensory change.       Reports tingling in her feet and that they feel cold all the  time, even when warm to the touch  Endo/Heme/Allergies: Negative.   Psychiatric/Behavioral: Negative.  Suicidal ideas: \.     Past Medical History:  Diagnosis Date   Chronic back pain    Chronic combined systolic and diastolic congestive heart failure ([place]) 09/07/2019   Diabetes mellitus without complication ([place])    History of COVID-19 11/07/2019   Migraines    Pregnancy induced hypertension    Pulmonary embolus ([place]) 12/16/2019   Pulmonary infarct ([place]) 12/17/2019    Past Surgical History:  Procedure Laterality Date   CERVICAL BIOPSY  W/ LOOP ELECTRODE EXCISION  2009   Laparoscopic tubal sterilization with Falope rings.  2009   RIGHT/LEFT HEART CATH AND CORONARY ANGIOGRAPHY N/A 03/25/2019   Procedure: RIGHT/LEFT HEART CATH AND CORONARY ANGIOGRAPHY;  Surgeon: Jolaine Artist, MD;  Location: [facility];  Service: Cardiovascular;  Laterality: N/A;   TUBAL LIGATION     Soc Hx - married, 4 children. Not working.   reports that she has never smoked. She has never used smokeless tobacco. She reports that she does not drink alcohol and does not use drugs.  Allergies  Allergen Reactions   Ibuprofen Anaphylaxis    Family History  Problem Relation Age of Onset   Hypertension Mother    Anesthesia problems Neg Hx     Prior to Admission medications   Medication Sig Start Date End Date Taking? Authorizing Provider  atorvastatin (LIPITOR) 40 MG tablet Take 1 tablet (40 mg total) by mouth daily at 6 PM. 01/02/22  Yes Fritzi Mandes, MD  carvedilol (COREG) 6.25 MG tablet Take 1 tablet (6.25 mg total) by mouth 2 (two) times daily with a meal. 01/02/22  Yes Fritzi Mandes, MD  empagliflozin (JARDIANCE) 10 MG TABS tablet Take 1 tablet (10 mg total) by mouth daily before breakfast. 01/03/22  Yes Fritzi Mandes, MD  ibuprofen (ADVIL) 800 MG tablet Take 800 mg by mouth every 8 (eight) hours as needed. 06/03/22  Yes [provider]  insulin aspart (NOVOLOG FLEXPEN) 100 UNIT/ML FlexPen  Inject 2-15 Units into the skin 3 (three) times daily after meals. CBG 121 - 150: 2 units, 151 - 200: 3 units, 201 - 250: 5 units, 251 - 300: 8 units, 301 - 350: 11 units, 351 - 400: 15u 01/02/22  Yes Fritzi Mandes, MD  insulin glargine (LANTUS) 100 UNIT/ML Solostar Pen Inject 40 Units into the skin daily. 01/02/22  Yes Fritzi Mandes, MD  metFORMIN (GLUCOPHAGE) 500 MG tablet Take 1 tablet (500 mg total) by mouth 2 (two) times daily with a meal. 01/02/22  Yes Fritzi Mandes, MD  potassium chloride SA (KLOR-CON M) 20 MEQ tablet Take 1 tablet (20 mEq total) by mouth daily. 01/02/22  Yes Fritzi Mandes, MD  sacubitril-valsartan (ENTRESTO) 24-26 MG Take 1 tablet by mouth 2 (two) times daily. 01/02/22  Yes Fritzi Mandes, MD  spironolactone (ALDACTONE) 25 MG tablet Take 1 tablet (25 mg total) by mouth daily. 01/02/22  Yes Fritzi Mandes, MD  torsemide (DEMADEX) 20 MG tablet Take 2 tablets (40 mg total) by mouth daily. 01/02/22  Yes Fritzi Mandes, MD  amoxicillin (AMOXIL) 500 MG tablet Take 500 mg by mouth every 8 (eight) hours. Patient not taking: Reported on 06/12/2022 06/03/22   [provider]  benzonatate (TESSALON) 100 MG capsule Take 1 capsule (100 mg total) by mouth every 8 (eight) hours. Patient not taking: Reported on 06/12/2022 01/06/22   Dion Saucier A, PA  lidocaine (LIDODERM) 5 % Place 1 patch onto the skin daily. Remove & Discard patch within 12 hours or as directed by MD Patient not taking: Reported on 01/08/2022 01/06/22   Wilnette Kales, PA  Vericiguat 2.5 MG TABS Take 5 mg by mouth daily. Patient not taking: Reported on 06/12/2022    [provider]    Physical Exam: Vitals:   06/12/22 0614 06/12/22 0623 06/12/22 0753 06/12/22 1130  BP:  (!) 154/130 (!) 165/109 (!) 135/97  Pulse:  (!) 111 (!) 109 91  Resp:  (!) 22 17 15   Temp:  97.9 F (36.6 C) (!) 97.5 F (36.4 C)   TempSrc:  Oral Oral   SpO2:  100% 98% 98%  Weight: 65.3 kg     Height: 4\' 9"  (1.448 m)       Physical  Exam Vitals and nursing note reviewed.  Constitutional:      General: She is not in acute distress.    Appearance: Normal appearance. She is obese. She is ill-appearing.  HENT:     Head: Normocephalic and atraumatic.     Nose: Nose normal.     Mouth/Throat:     Mouth: Mucous membranes are dry.     Pharynx: Oropharynx is clear. No oropharyngeal exudate.  Eyes:     Extraocular Movements: Extraocular movements intact.     Conjunctiva/sclera: Conjunctivae normal.     Pupils: Pupils are equal, round, and reactive to light.  Neck:     Vascular: No carotid bruit.  Cardiovascular:     Rate and Rhythm: Regular rhythm. Tachycardia present.  Pulses: Normal pulses.     Heart sounds: Murmur heard.     Comments: 2+ radial pulses, 1+ right DP pulse, trace left DP pulse.  MM - short, early systolic mm best at apex.  Pulmonary:     Effort: Pulmonary effort is normal. No respiratory distress.     Breath sounds: Normal breath sounds. No wheezing or rales.  Abdominal:     General: Bowel sounds are normal. There is no distension.     Palpations: Abdomen is soft. There is no mass.     Tenderness: There is abdominal tenderness. There is no guarding.  Musculoskeletal:        General: Normal range of motion.     Cervical back: Normal range of motion and neck supple.     Right lower leg: Edema present.     Left lower leg: Edema present.     Comments: Trace distal LE edema  Lymphadenopathy:     Cervical: No cervical adenopathy.  Skin:    General: Skin is warm and dry.     Capillary Refill: Capillary refill takes less than 2 seconds.     Coloration: Skin is not jaundiced.     Findings: No bruising or lesion.  Neurological:     General: No focal deficit present.     Mental Status: She is alert and oriented to person, place, and time.     Cranial Nerves: No cranial nerve deficit.     Sensory: Sensory deficit present.     Comments: Diminished light touch distal LE right worse then left   Psychiatric:        Mood and Affect: Mood normal.        Behavior: Behavior normal.      Labs on Admission: I have personally reviewed following labs and imaging studies  CBC: Recent Labs  Lab 06/07/22 0954 06/12/22 0631  WBC 9.5 9.1  HGB 11.5* 11.4*  HCT 35.5* 34.7*  MCV 93.2 92.8  PLT 362 [id number]   Basic Metabolic Panel: Recent Labs  Lab 06/07/22 0954 06/12/22 0631  NA 135 137  K 3.5 2.9*  CL 100 99  CO2 28 28  GLUCOSE 360* 437*  BUN 15 12  CREATININE 0.76 0.69  CALCIUM 7.9* 8.1*   GFR: Estimated Creatinine Clearance: 76.4 mL/min (by C-G formula based on SCr of 0.69 mg/dL). Liver Function Tests: No results for input(s): "AST", "ALT", "ALKPHOS", "BILITOT", "PROT", "ALBUMIN" in the last 168 hours. No results for input(s): "LIPASE", "AMYLASE" in the last 168 hours. No results for input(s): "AMMONIA" in the last 168 hours. Coagulation Profile: No results for input(s): "INR", "PROTIME" in the last 168 hours. Cardiac Enzymes: No results for input(s): "CKTOTAL", "CKMB", "CKMBINDEX", "TROPONINI" in the last 168 hours. BNP (last 3 results) No results for input(s): "PROBNP" in the last 8760 hours. HbA1C: No results for input(s): "HGBA1C" in the last 72 hours. CBG: No results for input(s): "GLUCAP" in the last 168 hours. Lipid Profile: No results for input(s): "CHOL", "HDL", "LDLCALC", "TRIG", "CHOLHDL", "LDLDIRECT" in the last 72 hours. Thyroid Function Tests: No results for input(s): "TSH", "T4TOTAL", "FREET4", "T3FREE", "THYROIDAB" in the last 72 hours. Anemia Panel: No results for input(s): "VITAMINB12", "FOLATE", "FERRITIN", "TIBC", "IRON", "RETICCTPCT" in the last 72 hours. Urine analysis:    Component Value Date/Time   COLORURINE YELLOW 01/06/2022 1000   APPEARANCEUR CLEAR 01/06/2022 1000   LABSPEC 1.020 01/06/2022 1000   PHURINE 6.0 01/06/2022 1000   GLUCOSEU >=500 (A) 01/06/2022 1000   HGBUR TRACE (  A) 01/06/2022 1000   BILIRUBINUR NEGATIVE 01/06/2022 1000    KETONESUR NEGATIVE 01/06/2022 1000   PROTEINUR 100 (A) 01/06/2022 1000   UROBILINOGEN 0.2 02/04/2019 1720   NITRITE NEGATIVE 01/06/2022 1000   LEUKOCYTESUR NEGATIVE 01/06/2022 1000    Radiological Exams on Admission: I have personally reviewed images DG Chest Port 1 View  Result Date: 06/12/2022 CLINICAL DATA:  Increased bilateral leg swelling EXAM: PORTABLE CHEST 1 VIEW COMPARISON:  06/07/2022 FINDINGS: Bilateral diffuse mild interstitial thickening. No pleural effusion or pneumothorax. No focal consolidation. Stable mild cardiomegaly. No acute osseous abnormality. IMPRESSION: 1. Stable cardiomegaly. 2. Bilateral mild interstitial thickening which may reflect underlying chronic interstitial disease versus superimposed interstitial infection or mild interstitial edema. Electronically Signed   By: Kathreen Devoid M.D.   On: 06/12/2022 08:24    EKG: I have personally reviewed EKG: Sinus tachycardia w/o acute changes  Assessment/Plan Principal Problem:   Acute on chronic combined systolic (congestive) and diastolic (congestive) heart failure (HCC) Active Problems:   Uncontrolled type 2 diabetes mellitus with hyperglycemia, with long-term current use of insulin (HCC)   Hypokalemia    Assessment and Plan: * Acute on chronic combined systolic (congestive) and diastolic (congestive) heart failure (HCC) Patient with combined chronic HF now with exacerbation, BNP 867.5 and symptomatic. She has similar episode 06/07/22 which responded to IV lasix 80 mg in ED and she was discharged. Last ECHO 01/01/22. Last Cardiology OV note 01/08/22 - started on vericiquat and ambulatory furosic infusion both of which she is not using.   Plan Tele admit  IV lasix 40 mg q 6  HF protocols  Continue home cardiac meds  Cardiology consult - referral to heart failure clinic.  Uncontrolled type 2 diabetes mellitus with hyperglycemia, with long-term current use of insulin ([place]) Uncontrolled diabetic with last A1C in  [facility] 12/31/21 15.1%. She is on basal insulin, metformin, jardiance and sliding scale.  Plan A1C  Continue jardiance  Continue basal insulin at home dose  Hold metformin while critically ill  Sliding scale coverage.   Hypokalemia Patient with K 2.9. Per chart takes oral potassium at home  Plan Continue home dose oral K  10 meq runs x 3.        DVT prophylaxis: Lovenox Code Status: Full Code Family Communication: husband present during interview and exam. No questions  Disposition Plan: home when stable  Consults called: cardiology/heart failure team - Dr. Nehemiah Massed contacted by secure chat  Admission status: Inpatient, Telemetry bed   Adella Hare, MD [facility] 06/12/2022, 12:02 PM

## 2022-06-12 NOTE — Assessment & Plan Note (Addendum)
Hemoglobin A1c down to 11.1.  Continue Semglee insulin 40 units daily and Jardiance.

## 2022-06-12 NOTE — Assessment & Plan Note (Addendum)
Potassium improved

## 2022-06-12 NOTE — Inpatient Diabetes Management (Signed)
Inpatient Diabetes Program Recommendations  AACE/ADA: New Consensus Statement on Inpatient Glycemic Control   Target Ranges:  Prepandial:   less than 140 mg/dL      Peak postprandial:   less than 180 mg/dL (1-2 hours)      Critically ill patients:  140 - 180 mg/dL    Latest Reference Range & Units 06/12/22 06:31  CO2 22 - 32 mmol/L 28  Glucose 70 - 99 mg/dL 437 (H)  Anion gap 5 - 15  10   Review of Glycemic Control  Diabetes history: DM Outpatient Diabetes medications: Lantus 40 units daily, Novolog 2-15 units TID with meals, Metformin 500 mg BID, Jardiance 10 mg daily Current orders for Inpatient glycemic control: None; in ED  Inpatient Diabetes Program Recommendations:    Insulin: If patient is admitted, please consider ordering Semglee 25 units Q24H, CBGs AC&HS, Novolog 0-15 units AC&HS, and Novolog 4 units TID with meals for meal coverage if patient eats at least 50% of meals.   Thanks, Barnie Alderman, RN, MSN, [place] Diabetes Coordinator Inpatient Diabetes Program [phone] (Team Pager from 8am to [place])

## 2022-06-13 ENCOUNTER — Inpatient Hospital Stay
Admit: 2022-06-13 | Discharge: 2022-06-13 | Disposition: A | Discharge status: Home / Self Care | Payer: Medicare Other | Attending: Cardiology | Admitting: Cardiology

## 2022-06-13 ENCOUNTER — Encounter: Payer: Self-pay | Admitting: Internal Medicine

## 2022-06-13 DIAGNOSIS — E1165 Type 2 diabetes mellitus with hyperglycemia: Secondary | ICD-10-CM

## 2022-06-13 DIAGNOSIS — E876 Hypokalemia: Secondary | ICD-10-CM | POA: Diagnosis not present

## 2022-06-13 DIAGNOSIS — I5023 Acute on chronic systolic (congestive) heart failure: Secondary | ICD-10-CM

## 2022-06-13 DIAGNOSIS — E669 Obesity, unspecified: Secondary | ICD-10-CM

## 2022-06-13 DIAGNOSIS — Z794 Long term (current) use of insulin: Secondary | ICD-10-CM

## 2022-06-13 LAB — HEMOGLOBIN A1C
Hgb A1c MFr Bld: 11.1 % — ABNORMAL HIGH (ref 4.8–5.6)
Mean Plasma Glucose: 271.87 mg/dL

## 2022-06-13 LAB — BASIC METABOLIC PANEL
Anion gap: 7 (ref 5–15)
BUN: 14 mg/dL (ref 6–20)
CO2: 29 mmol/L (ref 22–32)
Calcium: 7.8 mg/dL — ABNORMAL LOW (ref 8.9–10.3)
Chloride: 102 mmol/L (ref 98–111)
Creatinine, Ser: 0.73 mg/dL (ref 0.44–1.00)
GFR, Estimated: >60 mL/min (ref >60)
Glucose, Bld: 223 mg/dL — ABNORMAL HIGH (ref 70–99)
Potassium: 3.8 mmol/L (ref 3.5–5.1)
Sodium: 138 mmol/L (ref 135–145)

## 2022-06-13 LAB — ECHOCARDIOGRAM COMPLETE
AR max vel: 1.92 cm2
AV Area VTI: 2.45 cm2
AV Area mean vel: 2.22 cm2
AV Mean grad: 1 mmHg
AV Peak grad: 2.5 mmHg
Ao pk vel: 0.8 m/s
Area-P 1/2: 5.06 cm2
Calc EF: 14 %
Height: 57 in
S' Lateral: 5 cm
Single Plane A2C EF: 12.8 %
Single Plane A4C EF: 15.8 %
Weight: 2246.93 oz

## 2022-06-13 LAB — GLUCOSE, CAPILLARY
Glucose-Capillary: 188 mg/dL — ABNORMAL HIGH (ref 70–99)
Glucose-Capillary: 128 mg/dL — ABNORMAL HIGH (ref 70–99)
Glucose-Capillary: 199 mg/dL — ABNORMAL HIGH (ref 70–99)
Glucose-Capillary: 268 mg/dL — ABNORMAL HIGH (ref 70–99)

## 2022-06-13 MED ORDER — GABAPENTIN 100 MG PO CAPS
100.0000 mg | ORAL_CAPSULE | Freq: Three times a day (TID) | ORAL | Status: DC
  Administered 2022-06-13 – 2022-06-15 (×7): 100 mg via ORAL
  Filled 2022-06-13 (×7): qty 1

## 2022-06-13 MED ORDER — POTASSIUM CHLORIDE CRYS ER 20 MEQ PO TBCR
40.0000 meq | EXTENDED_RELEASE_TABLET | Freq: Two times a day (BID) | ORAL | Status: DC
  Administered 2022-06-13: 40 meq via ORAL
  Filled 2022-06-13: qty 2

## 2022-06-13 MED ORDER — POTASSIUM CHLORIDE CRYS ER 20 MEQ PO TBCR
20.0000 meq | EXTENDED_RELEASE_TABLET | Freq: Two times a day (BID) | ORAL | Status: DC
  Administered 2022-06-13 – 2022-06-14 (×2): 20 meq via ORAL
  Filled 2022-06-13 (×2): qty 1

## 2022-06-13 MED ORDER — GABAPENTIN 100 MG PO CAPS
100.0000 mg | ORAL_CAPSULE | Freq: Once | ORAL | Status: AC
  Administered 2022-06-13: 100 mg via ORAL
  Filled 2022-06-13: qty 1

## 2022-06-13 MED ORDER — LIVING WELL WITH DIABETES BOOK
Freq: Once | Status: AC
  Filled 2022-06-13: qty 1

## 2022-06-13 MED ORDER — FUROSEMIDE 10 MG/ML IJ SOLN
40.0000 mg | Freq: Two times a day (BID) | INTRAMUSCULAR | Status: DC
  Administered 2022-06-13 – 2022-06-14 (×2): 40 mg via INTRAVENOUS
  Filled 2022-06-13 (×2): qty 4

## 2022-06-13 NOTE — Progress Notes (Signed)
       CROSS COVER NOTE  NAME: Cheryl Mercer MRN: [id number] DOB : April 15, 1986       HPI/Events of Note   Patient with past history of peripheral neuropathy previously treated with gabapentin but had stopped taking the med. Now requestiing restart due to severe burning in her legs interfereing with her sleep  Assessment and  Interventions   Assessment:  Plan: One dose gabapentin ordered to monitor response Day team to followup and place as scheduled if preferred       Kathlene Cote NP [facility]

## 2022-06-13 NOTE — Inpatient Diabetes Management (Signed)
Inpatient Diabetes Program Recommendations  AACE/ADA: New Consensus Statement on Inpatient Glycemic Control (2015)  Target Ranges:  Prepandial:   less than 140 mg/dL      Peak postprandial:   less than 180 mg/dL (1-2 hours)      Critically ill patients:  140 - 180 mg/dL   Lab Results  Component Value Date   GLUCAP 128 (H) 06/13/2022   HGBA1C 11.1 (H) 06/13/2022    Latest Reference Range & Units 12/31/21 05:50 06/13/22 05:57  Hemoglobin A1C 4.8 - 5.6 % 15.1 (H) 11.1 (H)  (H): Data is abnormally high  Inpatient Diabetes Program Recommendations:   Noted A1C decreased from 15.1 12/31/21 to current 11.1. Agree with treatment plan.  Thank you, Nani Gasser. Colm Lyford, RN, MSN, CDE  Diabetes Coordinator Inpatient Glycemic Control Team Team Pager [phone] (8am-5pm) 06/13/2022 11:37 AM

## 2022-06-13 NOTE — Assessment & Plan Note (Signed)
Hold further Lasix dosing.  Decrease Coreg dose with holding parameters.  Holding parameters on Entresto.  Decrease spironolactone dose.  Continue Jardiance prescribed.  Compliance with medications explained.  EF less than 20%.  Will need close clinical follow-up with cardiology as outpatient.

## 2022-06-13 NOTE — Assessment & Plan Note (Signed)
BMI down to 28.39 with diuresis.

## 2022-06-13 NOTE — Progress Notes (Signed)
*  PRELIMINARY RESULTS* Echocardiogram 2D Echocardiogram has been performed.  Cheryl Mercer 06/13/2022, 9:22 AM

## 2022-06-13 NOTE — Consult Note (Signed)
   Heart Failure Nurse Navigator Note  She presented to the emergency room with complaints of worsening bilateral lower extremity edema, exertional dyspnea, and orthopnea.  BNP 875.  Chest x-ray revealed pulmonary edema.  HFrEF less than 20%.  Left ventricle with global hypokinesis.  Left ventricular internal cavity is severely dilated.  Mild concentric LVH.  Left ventricular diastolic parameters were indeterminate.  Right ventricular systolic function is severely reduced.  Right ventricular size is severely enlarged.  Smoke seen.  Left atrium is severely dilated.  Right atrium is severely dilated.  Mild mitral regurgitation.  Comorbidities:  Poorly controlled diabetes with hemoglobin A1c of 15 Noncompliance with medications  Medications:  Atorvastatin 40 mg daily Carvedilol 6.25 mg 2 times a day Jardiance 10 mg daily Potassium chloride 40 mEq 2 times a day Entresto 24/26 mg 2 times a day Spironolactone 25 mg daily 640 mg IV 2 times daily   Labs:  Sodium 138, potassium 3.8 chloride 102, CO2 29, BUN 14, creatinine 0.73, estimated GFR than 60. Weight is 63.7 kg Intake 240 mL Output 2300 mL Blood pressure 107/73  Initial meeting with patient on this admission. She was sitting up in the bed in no acute distress.  She admits that she needs to make lifestyle changes and become more serious with taking her medications.  She states that she has a 35 year old and a 35 year old at home that have been talking with her about being more serious.   Discussed diet, she states that is 1 thing that she felt she had been doing better with the eat fresh or frozen vegetables and lean meats, do not eat anything prepackaged, processed etc.  She states that very rarely eat at restaurants.  She does not use salt at the table but states that her son had told her he had heard that using pink salt would be healthier for her.  Advised her that salt is salt whether it is normal table salt, pink salt,  Himalayan salt or sea salt,  that salt is not her friend.  Also discussed fluid restriction of 64 ounces daily and what that entails.  She states that she drinks 3 bottles of water and then will have diet Dr. Malachi Bonds with one of her meals.  Discussed reading labels and knowing exactly amount the fluid ounce and the bottles of water she is drinking along with her soda.   She states at this time she is unemployed, but her husband is working.  She feels that she is able to afford her medications.  I told her should that change to let Otila Kluver in the heart failure clinic aware of that.  She did not feel that transportation would be a problem either.  She was given a weekly medication box for setting up her medications along with a bag to bring her bottles of medication to her appointments.  She had no further questions.  She has follow-up in the outpatient heart failure clinic on October 30 at 12:30 in the afternoon.  She has a 26% no-show ratio was 15 out of 58 appointments.  She was given the living with heart failure teaching booklet, zone magnet, follow on low-sodium and heart failure along with weight chart.  She had no further questions.  Pricilla Riffle RN [facility]

## 2022-06-13 NOTE — Progress Notes (Signed)
  Progress Note   Patient: Cheryl Mercer KTG:[id number] DOB: [birth year] DOA: 06/12/2022     1 DOS: the patient was seen and examined on 06/13/2022     Assessment and Plan: * Acute on chronic systolic CHF (congestive heart failure) (HCC) Change Lasix to 40 mg IV twice daily.  Coreg and Entresto, spironolactone and Jardiance prescribed.  Compliance with medications explained.  EF less than 20%.  Will need close clinical follow-up with cardiology as outpatient.  Uncontrolled type 2 diabetes mellitus with hyperglycemia, with long-term current use of insulin (HCC) Hemoglobin A1c down to 11.1.  Continue Semglee insulin 40 units daily and Jardiance.  Obesity (BMI 30-39.9) BMI 30.39 with current height and weight in computer.  Hypokalemia Potassium improved from admission with supplementation.  With Aldactone hopefully would not need supplementation upon discharge.        Subjective: The patient has not been taking her medications as directed.  Coming in with lower extremity edema.  She states her breathing is better.  Admitted with CHF exacerbation  Physical Exam: Vitals:   06/13/22 0254 06/13/22 0415 06/13/22 0839 06/13/22 1126  BP: (!) 120/94  (!) 130/98 107/73  Pulse: 95  92 93  Resp: 16  20 18   Temp:   97.8 F (36.6 C) 98.2 F (36.8 C)  TempSrc:   Oral Oral  SpO2: 100%  99% 100%  Weight:  63.7 kg    Height:       Physical Exam HENT:     Head: Normocephalic.     Mouth/Throat:     Pharynx: No oropharyngeal exudate.  Eyes:     General: Lids are normal.     Conjunctiva/sclera: Conjunctivae normal.  Cardiovascular:     Rate and Rhythm: Normal rate and regular rhythm.     Heart sounds: S1 normal and S2 normal. Murmur heard.     Systolic murmur is present with a grade of 3/6.  Pulmonary:     Breath sounds: Examination of the right-lower field reveals decreased breath sounds. Examination of the left-lower field reveals decreased breath sounds. Decreased breath  sounds present. No wheezing, rhonchi or rales.  Abdominal:     Palpations: Abdomen is soft.     Tenderness: There is no abdominal tenderness.  Musculoskeletal:     Right lower leg: Swelling present.     Left lower leg: Swelling present.  Skin:    General: Skin is warm.     Findings: No rash.  Neurological:     Mental Status: She is alert and oriented to person, place, and time.     Data Reviewed: EF less than 20%. Potassium 3.8, creatinine 0.73, BNP 867.5, hemoglobin 11.4  Family Communication: Spoke with family at the bedside  Disposition: Status is: Inpatient Remains inpatient appropriate because: Requiring IV diuresis today  Planned Discharge Destination: Home    Time spent: 28 minutes Case discussed with cardiology Author: Loletha Grayer, MD 06/13/2022 1:45 PM  For on call review [email or web address].

## 2022-06-13 NOTE — Progress Notes (Signed)
[facility] CLINIC CARDIOLOGY CONSULT NOTE       Patient ID: Cheryl Mercer MRN: [id number] DOB/AGE: September 27, 1985 36 y.o.  Admit date: 06/12/2022 Referring Physician Dr. Illene Regulus Primary Physician none Primary Cardiologist none Reason for Consultation AoCHF  HPI: Cheryl Mercer is a 35yoF with a PMH of NICM (25-30%, global hypo, mod-sev dilated LV, G3 DD, mod reduced RVEF, mild MR, biatrial dilation 12/2021), poorly controlled insulin dependent DM (A1c 15% 12/2021), hyperlipidemia, history of PE (not on [facility]), who presented to [facility] ED 06/12/2022 with worsening lower extremity edema, orthopnea, dyspnea on exertion similar to her prior HF exacerbations x 2 weeks for which cardiology is consulted for further assistance.  Interval history:  -Feels better today although remains fatigued, less short of breath and her peripheral edema has improved.  Not quite back to baseline. -She acknowledges the importance of taking her medicines now, seems to have good family support. -No chest pain, palpitations -Diuresing well, I's and O's are not accurate   Review of systems complete and found to be negative unless listed above     Past Medical History:  Diagnosis Date   Chronic back pain    Chronic combined systolic and diastolic congestive heart failure (HCC) 09/07/2019   Diabetes mellitus without complication (HCC)    History of COVID-19 11/07/2019   Migraines    Peripheral neuropathy    Pregnancy induced hypertension    Pulmonary embolus (HCC) 12/16/2019   Pulmonary infarct (HCC) 12/17/2019    Past Surgical History:  Procedure Laterality Date   CERVICAL BIOPSY  W/ LOOP ELECTRODE EXCISION  2009   Laparoscopic tubal sterilization with Falope rings.  2009   RIGHT/LEFT HEART CATH AND CORONARY ANGIOGRAPHY N/A 03/25/2019   Procedure: RIGHT/LEFT HEART CATH AND CORONARY ANGIOGRAPHY;  Surgeon: Dolores Patty, MD;  Location: [facility] INVASIVE CV LAB;  Service: Cardiovascular;   Laterality: N/A;   TUBAL LIGATION      Medications Prior to Admission  Medication Sig Dispense Refill Last Dose   atorvastatin (LIPITOR) 40 MG tablet Take 1 tablet (40 mg total) by mouth daily at 6 PM. 30 tablet 1 Past Month   carvedilol (COREG) 6.25 MG tablet Take 1 tablet (6.25 mg total) by mouth 2 (two) times daily with a meal. 60 tablet 2 Past Week   empagliflozin (JARDIANCE) 10 MG TABS tablet Take 1 tablet (10 mg total) by mouth daily before breakfast. 30 tablet 1 Past Week   ibuprofen (ADVIL) 800 MG tablet Take 800 mg by mouth every 8 (eight) hours as needed.   Past Month   insulin aspart (NOVOLOG FLEXPEN) 100 UNIT/ML FlexPen Inject 2-15 Units into the skin 3 (three) times daily after meals. CBG 121 - 150: 2 units, 151 - 200: 3 units, 201 - 250: 5 units, 251 - 300: 8 units, 301 - 350: 11 units, 351 - 400: 15u 15 mL 3 Past Month   insulin glargine (LANTUS) 100 UNIT/ML Solostar Pen Inject 40 Units into the skin daily. 1 mL 2 Past Week   metFORMIN (GLUCOPHAGE) 500 MG tablet Take 1 tablet (500 mg total) by mouth 2 (two) times daily with a meal. 60 tablet 1 Past Month   potassium chloride SA (KLOR-CON M) 20 MEQ tablet Take 1 tablet (20 mEq total) by mouth daily. 30 tablet 0 Past Week   sacubitril-valsartan (ENTRESTO) 24-26 MG Take 1 tablet by mouth 2 (two) times daily. 60 tablet 2 Past Week   spironolactone (ALDACTONE) 25 MG tablet Take 1 tablet (25 mg total) by  mouth daily. 30 tablet 2 Past Week   torsemide (DEMADEX) 20 MG tablet Take 2 tablets (40 mg total) by mouth daily. 60 tablet 2 Past Week   amoxicillin (AMOXIL) 500 MG tablet Take 500 mg by mouth every 8 (eight) hours. (Patient not taking: Reported on 06/12/2022)   Not Taking   benzonatate (TESSALON) 100 MG capsule Take 1 capsule (100 mg total) by mouth every 8 (eight) hours. (Patient not taking: Reported on 06/12/2022) 21 capsule 0 Not Taking   lidocaine (LIDODERM) 5 % Place 1 patch onto the skin daily. Remove & Discard patch within 12  hours or as directed by MD (Patient not taking: Reported on 01/08/2022) 30 patch 0    Vericiguat 2.5 MG TABS Take 5 mg by mouth daily. (Patient not taking: Reported on 06/12/2022)   Not Taking    Social History   Socioeconomic History   Marital status: Married    Spouse name: Not on file   Number of children: 4   Years of education: Not on file   Highest education level: Not on file  Occupational History   Not on file  Tobacco Use   Smoking status: Never   Smokeless tobacco: Never  Vaping Use   Vaping Use: Never used  Substance and Sexual Activity   Alcohol use: No    Alcohol/week: 6.0 standard drinks of alcohol    Types: 6 Shots of liquor per week   Drug use: No    Types: Marijuana   Sexual activity: Not Currently    Birth control/protection: Surgical    Comment: band placed around tubes in 2009 surgically by Dr. Emelda Fear  Other Topics Concern   Not on file  Social History Narrative   Not on file   Social Determinants of Health   Financial Resource Strain: Medium Risk (04/07/2019)   Overall Financial Resource Strain (CARDIA)    Difficulty of Paying Living Expenses: Somewhat hard  Food Insecurity: Food Insecurity Present (04/07/2019)   Hunger Vital Sign    Worried About Running Out of Food in the Last Year: Sometimes true    Ran Out of Food in the Last Year: Never true  Transportation Needs: No Transportation Needs (04/07/2019)   PRAPARE - Administrator, Civil Service (Medical): No    Lack of Transportation (Non-Medical): No  Physical Activity: Not on file  Stress: Not on file  Social Connections: Not on file  Intimate Partner Violence: Not on file    Family History  Problem Relation Age of Onset   Hypertension Mother    Anesthesia problems Neg Hx      Vitals:   06/12/22 2148 06/13/22 0011 06/13/22 0254 06/13/22 0415  BP: 126/85 123/89 (!) 120/94   Pulse: 96 98 95   Resp:  18 16   Temp:  97.9 F (36.6 C)    TempSrc:  Oral    SpO2: 100% 99% 100%    Weight:    63.7 kg  Height:        PHYSICAL EXAM General: Young Hispanic female lying flat in hospital bed, sleeping.  Awakens to voice. HEENT:  Normocephalic and atraumatic. Neck:  No JVD.  Lungs: Normal respiratory effort on room air. Clear bilaterally to auscultation. No wheezes, crackles, rhonchi.  Heart: HRRR . Normal S1 and S2 without gallops or murmurs.  Abdomen: Non-distended appearing.  Msk: Normal strength and tone for age. Extremities: Warm and well perfused. No clubbing, cyanosis.  Much improved peripheral edema, just a trace amount bilaterally.  Neuro: Alert and oriented X 3. Psych:  Answers questions appropriately.   Labs: Basic Metabolic Panel: Recent Labs    06/12/22 0631 06/13/22 0557  NA 137 138  K 2.9* 3.8  CL 99 102  CO2 28 29  GLUCOSE 437* 223*  BUN 12 14  CREATININE 0.69 0.73  CALCIUM 8.1* 7.8*    Liver Function Tests: No results for input(s): "AST", "ALT", "ALKPHOS", "BILITOT", "PROT", "ALBUMIN" in the last 72 hours. No results for input(s): "LIPASE", "AMYLASE" in the last 72 hours. CBC: Recent Labs    06/12/22 0631  WBC 9.1  HGB 11.4*  HCT 34.7*  MCV 92.8  PLT 365    Cardiac Enzymes: Recent Labs    06/12/22 0631  TROPONINIHS 20*    BNP: Recent Labs    06/12/22 0631  BNP 867.5*    D-Dimer: No results for input(s): "DDIMER" in the last 72 hours. Hemoglobin A1C: No results for input(s): "HGBA1C" in the last 72 hours. Fasting Lipid Panel: No results for input(s): "CHOL", "HDL", "LDLCALC", "TRIG", "CHOLHDL", "LDLDIRECT" in the last 72 hours. Thyroid Function Tests: No results for input(s): "TSH", "T4TOTAL", "T3FREE", "THYROIDAB" in the last 72 hours.  Invalid input(s): "FREET3" Anemia Panel: No results for input(s): "VITAMINB12", "FOLATE", "FERRITIN", "TIBC", "IRON", "RETICCTPCT" in the last 72 hours.   Radiology: [facility] Chest Port 1 View  Result Date: 06/12/2022 CLINICAL DATA:  Increased bilateral leg swelling EXAM:  PORTABLE CHEST 1 VIEW COMPARISON:  06/07/2022 FINDINGS: Bilateral diffuse mild interstitial thickening. No pleural effusion or pneumothorax. No focal consolidation. Stable mild cardiomegaly. No acute osseous abnormality. IMPRESSION: 1. Stable cardiomegaly. 2. Bilateral mild interstitial thickening which may reflect underlying chronic interstitial disease versus superimposed interstitial infection or mild interstitial edema. Electronically Signed   By: Kathreen Devoid M.D.   On: 06/12/2022 08:24   DG Chest 2 View  Result Date: 06/07/2022 CLINICAL DATA:  Shortness of breath, lower extremity swelling, history of CHF. EXAM: CHEST - 2 VIEW COMPARISON:  Chest x-rays dated 01/06/2022, 12/31/2021 and 06/13/2021. FINDINGS: Stable cardiomegaly. Persistent reticulonodular opacities bilaterally, basilar predominant, similar to the appearance/distribution on chest CT of 12/31/2021. No new lung findings. No pleural effusion or pneumothorax is seen. Osseous structures about the chest are unremarkable. IMPRESSION: 1. Stable cardiomegaly. 2. Persistent reticulonodular opacities bilaterally, basilar predominant, similar to the appearance/distribution on previous chest x-rays and the chest CT angiogram of 12/31/2021, compatible with chronic interstitial lung disease or atypical waxing and waning infectious process as suggested on the chest CT angiogram report. 3. No new findings. No evidence of a superimposed consolidating pneumonia. Electronically Signed   By: Franki Cabot M.D.   On: 06/07/2022 10:15    ECHO 01/01/2022  1. Left ventricular ejection fraction, by estimation, is 25 to 30%. The  left ventricle has severely decreased function. The left ventricle  demonstrates global hypokinesis. The left ventricular internal cavity size  was moderately to severely dilated.  Left ventricular diastolic parameters are consistent with Grade III  diastolic dysfunction (restrictive).   2. Right ventricular systolic function is  moderately reduced. The right  ventricular size is moderately enlarged. Moderately increased right  ventricular wall thickness.   3. Left atrial size was mildly dilated.   4. Right atrial size was mildly dilated.   5. The mitral valve is grossly normal. Mild mitral valve regurgitation.   6. The aortic valve is grossly normal. Aortic valve regurgitation is not  visualized. Aortic valve sclerosis is present, with no evidence of aortic  valve stenosis.  R/LHC 03/2019 Findings:   Ao = 122/88 (104) LV = 125/15 RA = 2 RV = 35/6 PA = 37/14 (25) PCW = 18 Fick cardiac output/index = 5.3/3.2 SVR = 1543 PVR = 1.3 WU Ao sat = 99% PA sat = 74%, 76%   Assessment: 1. Normal coronary arteries 2. 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 Check cMRI.    Arvilla Meres, MD  9:37 AM  TELEMETRY reviewed by me (LT) 06/13/2022 : Sinus rhythm rate 90s EKG reviewed by me: Sinus tachycardia rate 110, nonspecific T wave abnormalities similar to prior from 10/21 and 5/22  Data reviewed by me (LT) 06/13/2022: Hospitalist progress note, I's and O's, vitals, telemetry, CBC, BMP, cross cover note  Principal Problem:   Acute on chronic combined systolic (congestive) and diastolic (congestive) heart failure (HCC) Active Problems:   Uncontrolled type 2 diabetes mellitus with hyperglycemia, with long-term current use of insulin (HCC)   Hypokalemia    ASSESSMENT AND PLAN:  Cheryl Mercer is a 35yoF with a PMH of NICM (25-30%, global hypo, mod-sev dilated LV, G3 DD, mod reduced RVEF, mild MR, biatrial dilation 12/2021), poorly controlled insulin dependent DM (A1c 15% 12/2021), hyperlipidemia, history of PE (not on [facility]), who [facility] to [facility] ED 06/12/2022 with worsening lower extremity edema, orthopnea, dyspnea on exertion similar to her prior HF exacerbations for which cardiology was consulted for further  assistance.  #Acute on chronic HFrEF (25-30%) / NICM Presents with a several week history of worsening peripheral edema, orthopnea, and dyspnea on exertion.  Admits to noncompliance with her home medications, only taking them twice weekly stating "she just does not like taking them."  She does acknowledge the importance of taking her medications, and seems motivated to do so moving forward.  BNP elevated to 860 and appears volume overloaded in her lower extremities on exam.  The etiology of her cardiomyopathy was felt to be HTN related, exacerbated by medication noncompliance and alcohol use.  She had normal coronaries by [facility] in 2020. -S/p IV Lasix 80 mg x 1, Lasix IV 40 mg x 4 with clinical improvement.  Change to twice daily dosing  -Can discharge on torsemide 20 mg once daily -Continue daily potassium replenishment, prefers K+ tablets -Continue GDMT with carvedilol 6.25 mg twice daily, Entresto 24-26 twice daily, Jardiance 10 mg daily, spironolactone 25 mg daily -Echocardiogram complete. Her EF has improved from 10-15% (2020) to 25-30% (2023) with GDMT. -Reinforced the importance of compliance with her medications  -We will have her follow-up with Dr. Darrold Junker 1 to 2 weeks after discharge.  #Poorly controlled diabetes A1c 15% in May 2023.  Reports compliance with insulin.  Repeat A1c reflects this at 11.1% -Continue Jardiance, consider addition of GLP-1 as well  #Demand supply ischemia Troponin minimally elevated at 20 most consistent with demand/supply mismatch from her HF exacerbation and not ACS   Anticipate readiness for discharge from a cardiac standpoint 10/28.  This patient's plan of care was discussed and created with Dr. Gwen Pounds and he is in agreement.  Signed: Rebeca Allegra , PA-C 06/13/2022, 8:32 AM [facility]

## 2022-06-14 DIAGNOSIS — E876 Hypokalemia: Secondary | ICD-10-CM | POA: Diagnosis not present

## 2022-06-14 DIAGNOSIS — I959 Hypotension, unspecified: Secondary | ICD-10-CM | POA: Diagnosis not present

## 2022-06-14 DIAGNOSIS — E1165 Type 2 diabetes mellitus with hyperglycemia: Secondary | ICD-10-CM | POA: Diagnosis not present

## 2022-06-14 DIAGNOSIS — I5023 Acute on chronic systolic (congestive) heart failure: Secondary | ICD-10-CM | POA: Diagnosis not present

## 2022-06-14 LAB — GLUCOSE, CAPILLARY
Glucose-Capillary: 249 mg/dL — ABNORMAL HIGH (ref 70–99)
Glucose-Capillary: 135 mg/dL — ABNORMAL HIGH (ref 70–99)
Glucose-Capillary: 276 mg/dL — ABNORMAL HIGH (ref 70–99)
Glucose-Capillary: 210 mg/dL — ABNORMAL HIGH (ref 70–99)

## 2022-06-14 LAB — BASIC METABOLIC PANEL
Anion gap: 6 (ref 5–15)
BUN: 15 mg/dL (ref 6–20)
CO2: 30 mmol/L (ref 22–32)
Calcium: 8 mg/dL — ABNORMAL LOW (ref 8.9–10.3)
Chloride: 103 mmol/L (ref 98–111)
Creatinine, Ser: 0.77 mg/dL (ref 0.44–1.00)
GFR, Estimated: >60 mL/min (ref >60)
Glucose, Bld: 195 mg/dL — ABNORMAL HIGH (ref 70–99)
Potassium: 4 mmol/L (ref 3.5–5.1)
Sodium: 139 mmol/L (ref 135–145)

## 2022-06-14 LAB — MAGNESIUM: Magnesium: 1.6 mg/dL — ABNORMAL LOW (ref 1.7–2.4)

## 2022-06-14 MED ORDER — SPIRONOLACTONE 25 MG PO TABS: 25.0000 mg | ORAL_TABLET | Freq: Every day | ORAL | 0 refills | Status: DC

## 2022-06-14 MED ORDER — ENTRESTO 24-26 MG PO TABS: 1.0000 | ORAL_TABLET | Freq: Two times a day (BID) | ORAL | 0 refills | Status: DC

## 2022-06-14 MED ORDER — INSULIN PEN NEEDLE 33G X 5 MM MISC: 1.0000 | Freq: Three times a day (TID) | 0 refills | Status: DC

## 2022-06-14 MED ORDER — TORSEMIDE 20 MG PO TABS
20.0000 mg | ORAL_TABLET | Freq: Every day | ORAL | Status: DC
  Administered 2022-06-15: 20 mg via ORAL
  Filled 2022-06-14: qty 1

## 2022-06-14 MED ORDER — INSULIN GLARGINE 100 UNIT/ML SOLOSTAR PEN: 40.0000 [IU] | PEN_INJECTOR | Freq: Every day | SUBCUTANEOUS | 0 refills | Status: DC

## 2022-06-14 MED ORDER — POTASSIUM CHLORIDE CRYS ER 20 MEQ PO TBCR: 20.0000 meq | EXTENDED_RELEASE_TABLET | Freq: Every day | ORAL | 0 refills | Status: DC

## 2022-06-14 MED ORDER — POTASSIUM CHLORIDE CRYS ER 20 MEQ PO TBCR
20.0000 meq | EXTENDED_RELEASE_TABLET | Freq: Every day | ORAL | Status: DC
  Administered 2022-06-15: 20 meq via ORAL
  Filled 2022-06-14: qty 1

## 2022-06-14 MED ORDER — INSULIN LISPRO (1 UNIT DIAL) 100 UNIT/ML (KWIKPEN): PEN_INJECTOR | SUBCUTANEOUS | 0 refills | Status: DC

## 2022-06-14 MED ORDER — SPIRONOLACTONE 12.5 MG HALF TABLET
12.5000 mg | ORAL_TABLET | Freq: Every day | ORAL | Status: DC
  Administered 2022-06-15: 12.5 mg via ORAL
  Filled 2022-06-14: qty 1

## 2022-06-14 MED ORDER — ATORVASTATIN CALCIUM 40 MG PO TABS: 40.0000 mg | ORAL_TABLET | Freq: Every day | ORAL | 0 refills | Status: DC

## 2022-06-14 MED ORDER — GABAPENTIN 100 MG PO CAPS: 100.0000 mg | ORAL_CAPSULE | Freq: Three times a day (TID) | ORAL | 0 refills | Status: DC

## 2022-06-14 MED ORDER — SACUBITRIL-VALSARTAN 24-26 MG PO TABS
1.0000 | ORAL_TABLET | Freq: Two times a day (BID) | ORAL | Status: DC
  Administered 2022-06-14 – 2022-06-15 (×2): 1 via ORAL
  Filled 2022-06-14 (×2): qty 1

## 2022-06-14 MED ORDER — EMPAGLIFLOZIN 10 MG PO TABS: 10.0000 mg | ORAL_TABLET | Freq: Every day | ORAL | 0 refills | Status: DC

## 2022-06-14 MED ORDER — CARVEDILOL 6.25 MG PO TABS: 6.2500 mg | ORAL_TABLET | Freq: Two times a day (BID) | ORAL | 0 refills | Status: DC

## 2022-06-14 MED ORDER — MAGNESIUM SULFATE 2 GM/50ML IV SOLN
2.0000 g | Freq: Once | INTRAVENOUS | Status: AC
  Administered 2022-06-14: 2 g via INTRAVENOUS
  Filled 2022-06-14: qty 50

## 2022-06-14 MED ORDER — CARVEDILOL 3.125 MG PO TABS
3.1250 mg | ORAL_TABLET | Freq: Two times a day (BID) | ORAL | Status: DC
  Administered 2022-06-14 – 2022-06-15 (×2): 3.125 mg via ORAL
  Filled 2022-06-14 (×2): qty 1

## 2022-06-14 MED ORDER — TORSEMIDE 20 MG PO TABS: 40.0000 mg | ORAL_TABLET | Freq: Every day | ORAL | 0 refills | Status: DC

## 2022-06-14 NOTE — Progress Notes (Signed)
Pts BP 89/63 MAP 72 - MD made aware/ will continue to monitor and recheck BP at 2pm as ordered

## 2022-06-14 NOTE — Progress Notes (Signed)
  Transition of Care [facility]) Screening Note   Patient Details  Name: Cheryl Mercer Date of Birth: 07-Oct-1985   Transition of Care [facility]) CM/SW Contact:    Alberteen Sam, LCSW Phone Number: 06/14/2022, 10:05 AM    Transition of Care Department [facility]) has reviewed patient and no TOC needs have been identified at this time. We will continue to monitor patient advancement through interdisciplinary progression rounds. If new patient transition needs arise, please place a TOC consult.  [place], [place]

## 2022-06-14 NOTE — Assessment & Plan Note (Signed)
This afternoon's blood pressure is low.  We will hold afternoon Lasix dose.  Holding parameters on Coreg and Entresto this afternoon.

## 2022-06-14 NOTE — Progress Notes (Signed)
  Progress Note   Patient: Cheryl Mercer YIF:[id number] DOB: 1986/05/09 DOA: 06/12/2022     2 DOS: the patient was seen and examined on 06/14/2022     Assessment and Plan: * Hypotension This afternoon's blood pressure is low.  We will hold afternoon Lasix dose.  Holding parameters on Coreg and Entresto this afternoon.  Acute on chronic systolic CHF (congestive heart failure) ([place]) Hold further Lasix dosing.  Decrease Coreg dose with holding parameters.  Holding parameters on Entresto.  Decrease spironolactone dose.  Continue Jardiance prescribed.  Compliance with medications explained.  EF less than 20%.  Will need close clinical follow-up with cardiology as outpatient.  Uncontrolled type 2 diabetes mellitus with hyperglycemia, with long-term current use of insulin (HCC) Hemoglobin A1c down to 11.1.  Continue Semglee insulin 40 units daily and Jardiance.  Hypomagnesemia We will give magnesium supplementation today  Obesity (BMI 30-39.9) BMI down to 28.39 with diuresis.  Hypokalemia Potassium improved        Subjective: Patient swelling is down.  Still short of breath with ambulation.  Blood pressure this afternoon is low.  Physical Exam: Vitals:   06/14/22 0820 06/14/22 0910 06/14/22 1159 06/14/22 1404  BP: 112/81  (!) 89/63 (!) 88/68  Pulse: (!) 103  93   Resp: 17  17   Temp:   97.6 F (36.4 C)   TempSrc:      SpO2: (!) 89% 99% 100%   Weight:      Height:       Physical Exam HENT:     Head: Normocephalic.     Mouth/Throat:     Pharynx: No oropharyngeal exudate.  Eyes:     General: Lids are normal.     Conjunctiva/sclera: Conjunctivae normal.  Cardiovascular:     Rate and Rhythm: Normal rate and regular rhythm.     Heart sounds: S1 normal and S2 normal. Murmur heard.     Systolic murmur is present with a grade of 3/6.  Pulmonary:     Breath sounds: Examination of the right-lower field reveals decreased breath sounds. Examination of the left-lower  field reveals decreased breath sounds. Decreased breath sounds present. No wheezing, rhonchi or rales.  Abdominal:     Palpations: Abdomen is soft.     Tenderness: There is no abdominal tenderness.  Musculoskeletal:     Right lower leg: No swelling.     Left lower leg: No swelling.  Skin:    General: Skin is warm.     Findings: No rash.  Neurological:     Mental Status: She is alert and oriented to person, place, and time.     Data Reviewed: Creatinine 0.77, potassium 4.0, magnesium 1.6  Disposition: Status is: Inpatient Remains inpatient appropriate because: Hypotensive this afternoon so discharge was canceled.  Planned Discharge Destination: Home    Time spent: 28 minutes  Author: Loletha Grayer, MD 06/14/2022 3:18 PM  For on call review [email or web address].

## 2022-06-14 NOTE — Progress Notes (Signed)
SUBJECTIVE: Patient is having some leg pain while lying down.  She states she has cramps in the legs.  She was walking earlier with her husband.   Vitals:   06/14/22 0448 06/14/22 0614 06/14/22 0820 06/14/22 0910  BP:  108/86 112/81   Pulse:  100 (!) 103   Resp:  17 17   Temp:  98 F (36.7 C)    TempSrc:      SpO2:  98% (!) 89% 99%  Weight: 59.5 kg     Height:        Intake/Output Summary (Last 24 hours) at 06/14/2022 1156 Last data filed at 06/13/2022 2152 Gross per 24 hour  Intake 363 ml  Output --  Net 363 ml    LABS: Basic Metabolic Panel: Recent Labs    06/13/22 0557 06/14/22 0452  NA 138 139  K 3.8 4.0  CL 102 103  CO2 29 30  GLUCOSE 223* 195*  BUN 14 15  CREATININE 0.73 0.77  CALCIUM 7.8* 8.0*  MG  --  1.6*   Liver Function Tests: No results for input(s): "AST", "ALT", "ALKPHOS", "BILITOT", "PROT", "ALBUMIN" in the last 72 hours. No results for input(s): "LIPASE", "AMYLASE" in the last 72 hours. CBC: Recent Labs    06/12/22 0631  WBC 9.1  HGB 11.4*  HCT 34.7*  MCV 92.8  PLT 365   Cardiac Enzymes: No results for input(s): "CKTOTAL", "CKMB", "CKMBINDEX", "TROPONINI" in the last 72 hours. BNP: Invalid input(s): "POCBNP" D-Dimer: No results for input(s): "DDIMER" in the last 72 hours. Hemoglobin A1C: Recent Labs    06/13/22 0557  HGBA1C 11.1*   Fasting Lipid Panel: No results for input(s): "CHOL", "HDL", "LDLCALC", "TRIG", "CHOLHDL", "LDLDIRECT" in the last 72 hours. Thyroid Function Tests: No results for input(s): "TSH", "T4TOTAL", "T3FREE", "THYROIDAB" in the last 72 hours.  Invalid input(s): "FREET3" Anemia Panel: No results for input(s): "VITAMINB12", "FOLATE", "FERRITIN", "TIBC", "IRON", "RETICCTPCT" in the last 72 hours.   PHYSICAL EXAM General: Well developed, well nourished, in no acute distress HEENT:  Normocephalic and atramatic Neck:  No JVD.  Lungs: Clear bilaterally to auscultation and percussion. Heart: HRRR . Normal S1  and S2 without gallops or murmurs.  Abdomen: Bowel sounds are positive, abdomen soft and non-tender  Msk:  Back normal, normal gait. Normal strength and tone for age. Extremities: No clubbing, cyanosis or edema.   Neuro: Alert and oriented X 3. Psych:  Good affect, responds appropriately  TELEMETRY: Sinus rhythm  ASSESSMENT AND PLAN: HFrEF with severe LV dysfunction and uncontrolled diabetes.  He is being planned for discharge.  Follow-up with [facility] clinic cardiology within 1 to 2 weeks.  Principal Problem:   Acute on chronic systolic CHF (congestive heart failure) (HCC) Active Problems:   Uncontrolled type 2 diabetes mellitus with hyperglycemia, with long-term current use of insulin (HCC)   Hypokalemia   Obesity (BMI 30-39.9)    Tyonna Talerico A, MD, [facility] 06/14/2022 11:56 AM

## 2022-06-14 NOTE — Assessment & Plan Note (Signed)
We will give magnesium supplementation today

## 2022-06-15 DIAGNOSIS — I959 Hypotension, unspecified: Secondary | ICD-10-CM | POA: Diagnosis not present

## 2022-06-15 DIAGNOSIS — E1165 Type 2 diabetes mellitus with hyperglycemia: Secondary | ICD-10-CM | POA: Diagnosis not present

## 2022-06-15 DIAGNOSIS — E1140 Type 2 diabetes mellitus with diabetic neuropathy, unspecified: Secondary | ICD-10-CM

## 2022-06-15 DIAGNOSIS — I5023 Acute on chronic systolic (congestive) heart failure: Secondary | ICD-10-CM | POA: Diagnosis not present

## 2022-06-15 DIAGNOSIS — E876 Hypokalemia: Secondary | ICD-10-CM | POA: Diagnosis not present

## 2022-06-15 LAB — BASIC METABOLIC PANEL
Anion gap: 8 (ref 5–15)
BUN: 16 mg/dL (ref 6–20)
CO2: 27 mmol/L (ref 22–32)
Calcium: 8 mg/dL — ABNORMAL LOW (ref 8.9–10.3)
Chloride: 103 mmol/L (ref 98–111)
Creatinine, Ser: 0.78 mg/dL (ref 0.44–1.00)
GFR, Estimated: >60 mL/min (ref >60)
Glucose, Bld: 219 mg/dL — ABNORMAL HIGH (ref 70–99)
Potassium: 4.2 mmol/L (ref 3.5–5.1)
Sodium: 138 mmol/L (ref 135–145)

## 2022-06-15 LAB — GLUCOSE, CAPILLARY: Glucose-Capillary: 219 mg/dL — ABNORMAL HIGH (ref 70–99)

## 2022-06-15 LAB — MAGNESIUM: Magnesium: 2.1 mg/dL (ref 1.7–2.4)

## 2022-06-15 MED ORDER — BLOOD GLUCOSE MONITOR KIT: PACK | 0 refills | Status: DC

## 2022-06-15 MED ORDER — TORSEMIDE 20 MG PO TABS: ORAL_TABLET | ORAL | 0 refills | Status: DC

## 2022-06-15 MED ORDER — SPIRONOLACTONE 25 MG PO TABS: 12.5000 mg | ORAL_TABLET | Freq: Every day | ORAL | 0 refills | Status: DC

## 2022-06-15 MED ORDER — POTASSIUM CHLORIDE CRYS ER 10 MEQ PO TBCR: 10.0000 meq | EXTENDED_RELEASE_TABLET | Freq: Every day | ORAL | 0 refills | Status: DC

## 2022-06-15 MED ORDER — CARVEDILOL 3.125 MG PO TABS: 3.1250 mg | ORAL_TABLET | Freq: Two times a day (BID) | ORAL | 0 refills | Status: DC

## 2022-06-15 NOTE — Assessment & Plan Note (Signed)
Gabapentin prescribed

## 2022-06-15 NOTE — Progress Notes (Signed)
Cheryl Mercer to be D/C'd Home per MD order.  Discussed prescriptions and follow up appointments with the patient. Prescriptions given to patient, medication list explained in detail. Pt verbalized understanding.  Allergies as of 06/15/2022       Reactions   Ibuprofen Anaphylaxis        Medication List     STOP taking these medications    amoxicillin 500 MG tablet Commonly known as: AMOXIL   benzonatate 100 MG capsule Commonly known as: TESSALON   ibuprofen 800 MG tablet Commonly known as: ADVIL   lidocaine 5 % Commonly known as: Lidoderm   metFORMIN 500 MG tablet Commonly known as: GLUCOPHAGE   NovoLOG FlexPen 100 UNIT/ML FlexPen Generic drug: insulin aspart Replaced by: insulin lispro 100 UNIT/ML KwikPen   Vericiguat 2.5 MG Tabs       TAKE these medications    atorvastatin 40 MG tablet Commonly known as: LIPITOR Take 1 tablet (40 mg total) by mouth daily at 6 PM.   blood glucose meter kit and supplies Kit Dispense based on patient and insurance preference. Check your sugars prior to meals and at bedtime   carvedilol 3.125 MG tablet Commonly known as: COREG Take 1 tablet (3.125 mg total) by mouth 2 (two) times daily with a meal. What changed:  medication strength how much to take   empagliflozin 10 MG Tabs tablet Commonly known as: Jardiance Take 1 tablet (10 mg total) by mouth daily before breakfast.   Entresto 24-26 MG Generic drug: sacubitril-valsartan Take 1 tablet by mouth 2 (two) times daily.   gabapentin 100 MG capsule Commonly known as: NEURONTIN Take 1 capsule (100 mg total) by mouth 3 (three) times daily.   insulin glargine 100 UNIT/ML Solostar Pen Commonly known as: LANTUS Inject 40 Units into the skin daily.   insulin lispro 100 UNIT/ML KwikPen Commonly known as: HumaLOG KwikPen Inject 2-15 Units into the skin 3 (three) times daily after meals. CBG 121 - 150: 2 units, 151 - 200: 3 units, 201 - 250: 5 units, 251 - 300: 8  units, 301 - 350: 11 units, 351 - 400: 15u Replaces: NovoLOG FlexPen 100 UNIT/ML FlexPen   Insulin Pen Needle 33G X 5 MM Misc 1 Dose by Does not apply route 3 (three) times daily before meals.   potassium chloride 10 MEQ tablet Commonly known as: KLOR-CON M Take 1 tablet (10 mEq total) by mouth daily. Start taking on: June 16, 2022 What changed:  medication strength how much to take   spironolactone 25 MG tablet Commonly known as: ALDACTONE Take 0.5 tablets (12.5 mg total) by mouth daily. Start taking on: June 16, 2022 What changed: how much to take   torsemide 20 MG tablet Commonly known as: DEMADEX Take one tablet daily (if you gain 3lbs in a day or five pounds in a week can take an extra pill in the afternoon) Start taking on: June 16, 2022 What changed:  how much to take how to take this when to take this additional instructions        Vitals:   06/15/22 0820 06/15/22 1128  BP: (!) 112/90 102/66  Pulse: 100 91  Resp: 18   Temp: 98 F (36.7 C)   SpO2: 100% 100%    Tele box removed and returned. Skin clean, dry and intact without evidence of skin break down, no evidence of skin tears noted. IV catheter discontinued intact. Site without signs and symptoms of complications. Dressing and pressure applied. Pt denies pain  at this time. No complaints noted.  An After Visit Summary was printed and given to the patient. Patient escorted via [place], and D/C home via private auto.  Rolley Sims

## 2022-06-15 NOTE — Discharge Summary (Signed)
Physician Discharge Summary   Patient: Cheryl Mercer MRN: [id number] DOB: [birth year]  Admit date:     06/12/2022  Discharge date: 06/15/22  Discharge Physician: Loletha Grayer   PCP: Pccm, Ander Gaster, MD   Recommendations at discharge:   Follow-up PCP 5 days Follow-up cardiologist Follow-up CHF clinic  Discharge Diagnoses: Principal Problem:   Acute on chronic systolic CHF (congestive heart failure) (HCC) Active Problems:   Hypotension   Uncontrolled type 2 diabetes mellitus with hyperglycemia, with long-term current use of insulin (HCC)   Hypokalemia   Obesity (BMI 30-39.9)   Hypomagnesemia   Diabetic neuropathy [facility])   Hospital Course: 36 year old female with past medical history of systolic congestive heart failure, type 2 diabetes mellitus.  She presents to the hospital with lower extremity edema and some shortness of breath.  Patient was admitted with acute systolic congestive heart failure.  The patient was diuresed with IV Lasix and restarted on her medications.  Echocardiogram shows an EF less than 20%.  Smoke seen on echocardiogram.  Case discussed with cardiology and no need for anticoagulation.  On 06/14/2022.  Blood pressure went low and I held IV Lasix on the afternoon of 06/14/2022.  I also cut back the Coreg dose and Aldactone dose.  Patient would be a candidate for a defibrillator but need to follow-up for appointments.  1 month prescriptions prescribed into the pharmacy with no refills.  Glucometer, lancets and test strips, insulins and insulin pen needles prescribed to pharmacy.  Assessment and Plan: * Acute on chronic systolic CHF (congestive heart failure) ([place]) Discharged home on Entresto, lower dose Coreg, lower dose spironolactone, lower dose torsemide and Jardiance.  Compliance with medications explained.  EF less than 20%.  Will need close clinical follow-up with cardiology, CHF clinic to avoid future hospitalizations.  Hypotension Improved  after holding IV Lasix dose yesterday afternoon.  Recommended purchasing a blood pressure cuff.  Uncontrolled type 2 diabetes mellitus with hyperglycemia, with long-term current use of insulin (HCC) Hemoglobin A1c down to 11.1.  Continue Semglee insulin 40 units daily and Jardiance.  Patient also has short acting insulin prior to meals per sliding scale.  Diabetic neuropathy (HCC) Gabapentin prescribed  Hypomagnesemia Received IV magnesium administered  Obesity (BMI 30-39.9) BMI down to 28.39 with diuresis.  Hypokalemia Potassium improved with supplementation.  Since I cut back on the dose of Aldactone I will give low-dose potassium to go home with.  Recommend checking a BMP as outpatient and the goal would be increasing Aldactone dose and getting rid of the potassium supplementation.         Consultants: Cardiology Procedures performed: None Disposition: Home Diet recommendation:  Carb modified diet DISCHARGE MEDICATION: Allergies as of 06/15/2022       Reactions   Ibuprofen Anaphylaxis        Medication List     STOP taking these medications    amoxicillin 500 MG tablet Commonly known as: AMOXIL   benzonatate 100 MG capsule Commonly known as: TESSALON   ibuprofen 800 MG tablet Commonly known as: ADVIL   lidocaine 5 % Commonly known as: Lidoderm   metFORMIN 500 MG tablet Commonly known as: GLUCOPHAGE   NovoLOG FlexPen 100 UNIT/ML FlexPen Generic drug: insulin aspart Replaced by: insulin lispro 100 UNIT/ML KwikPen   Vericiguat 2.5 MG Tabs       TAKE these medications    atorvastatin 40 MG tablet Commonly known as: LIPITOR Take 1 tablet (40 mg total) by mouth daily at 6 PM.   blood  glucose meter kit and supplies Kit Dispense based on patient and insurance preference. Check your sugars prior to meals and at bedtime   carvedilol 3.125 MG tablet Commonly known as: COREG Take 1 tablet (3.125 mg total) by mouth 2 (two) times daily with a  meal. What changed:  medication strength how much to take   empagliflozin 10 MG Tabs tablet Commonly known as: Jardiance Take 1 tablet (10 mg total) by mouth daily before breakfast.   Entresto 24-26 MG Generic drug: sacubitril-valsartan Take 1 tablet by mouth 2 (two) times daily.   gabapentin 100 MG capsule Commonly known as: NEURONTIN Take 1 capsule (100 mg total) by mouth 3 (three) times daily.   insulin glargine 100 UNIT/ML Solostar Pen Commonly known as: LANTUS Inject 40 Units into the skin daily.   insulin lispro 100 UNIT/ML KwikPen Commonly known as: HumaLOG KwikPen Inject 2-15 Units into the skin 3 (three) times daily after meals. CBG 121 - 150: 2 units, 151 - 200: 3 units, 201 - 250: 5 units, 251 - 300: 8 units, 301 - 350: 11 units, 351 - 400: 15u Replaces: NovoLOG FlexPen 100 UNIT/ML FlexPen   Insulin Pen Needle 33G X 5 MM Misc 1 Dose by Does not apply route 3 (three) times daily before meals.   potassium chloride 10 MEQ tablet Commonly known as: KLOR-CON M Take 1 tablet (10 mEq total) by mouth daily. Start taking on: June 16, 2022 What changed:  medication strength how much to take   spironolactone 25 MG tablet Commonly known as: ALDACTONE Take 0.5 tablets (12.5 mg total) by mouth daily. Start taking on: June 16, 2022 What changed: how much to take   torsemide 20 MG tablet Commonly known as: DEMADEX Take one tablet daily (if you gain 3lbs in a day or five pounds in a week can take an extra pill in the afternoon) Start taking on: June 16, 2022 What changed:  how much to take how to take this when to take this additional instructions        Follow-up Information     Paraschos, Alexander, MD. Go in 1 week(s).   Specialty: Cardiology Contact information: [facility] [place] [address] [phone]         your medical doctor Follow up in 5 day(s).                 Discharge  Exam: Filed Weights   06/13/22 0415 06/14/22 0448 06/15/22 0549  Weight: 63.7 kg 59.5 kg 59.6 kg   Physical Exam HENT:     Head: Normocephalic.     Mouth/Throat:     Pharynx: No oropharyngeal exudate.  Eyes:     General: Lids are normal.     Conjunctiva/sclera: Conjunctivae normal.  Cardiovascular:     Rate and Rhythm: Normal rate and regular rhythm.     Heart sounds: S1 normal and S2 normal. Murmur heard.     Systolic murmur is present with a grade of 3/6.  Pulmonary:     Breath sounds: Examination of the right-lower field reveals decreased breath sounds. Examination of the left-lower field reveals decreased breath sounds. Decreased breath sounds present. No wheezing, rhonchi or rales.  Abdominal:     Palpations: Abdomen is soft.     Tenderness: There is no abdominal tenderness.  Musculoskeletal:     Right lower leg: No swelling.     Left lower leg: No swelling.  Skin:    General: Skin is warm.  Findings: No rash.  Neurological:     Mental Status: She is alert and oriented to person, place, and time.      Condition at discharge: stable  The results of significant diagnostics from this hospitalization (including imaging, microbiology, ancillary and laboratory) are listed below for reference.   Imaging Studies: ECHOCARDIOGRAM COMPLETE  Result Date: 06/13/2022    ECHOCARDIOGRAM REPORT   Patient Name:   EILEEN KANGAS Date of Exam: 06/13/2022 Medical Rec #:  [id number]              Height:       57.0 in Accession #:    [id number]             Weight:       140.4 lb Date of Birth:  09-Oct-1985             BSA:          1.548 m Patient Age:    36 years               BP:           135/97 mmHg Patient Gender: F                      HR:           91 bpm. Exam Location:  [facility] Procedure: 2D Echo, Cardiac Doppler, Color Doppler and Strain Analysis Indications:     CHF-acute systolic D98.33  History:         Patient has prior history of Echocardiogram examinations, most                   recent 01/01/2022. CHF; Risk Factors:Diabetes. Pulmonary                  embolus.  Sonographer:     Sherrie Sport Referring Phys:  [id number] Laurel TANG Diagnosing Phys: Neoma Laming  Sonographer Comments: Image quality was good. Global longitudinal strain was attempted. IMPRESSIONS  1. SMOKE seen,( low flow state). Left ventricular ejection fraction, by estimation, is <20%. The left ventricle has severely decreased function. The left ventricle demonstrates global hypokinesis. The left ventricular internal cavity size was severely dilated. There is mild concentric left ventricular hypertrophy. Left ventricular diastolic parameters are indeterminate.  2. Right ventricular systolic function is severely reduced. The right ventricular size is severely enlarged. Mildly increased right ventricular wall thickness.  3. SMOKE seen. Left atrial size was severely dilated.  4. Right atrial size was severely dilated.  5. The mitral valve is myxomatous. Mild mitral valve regurgitation.  6. The aortic valve is calcified. Aortic valve regurgitation is not visualized.  7. Aortic dilatation noted. Conclusion(s)/Recommendation(s): Findings consistent with dilated cardiomyopathy. FINDINGS  Left Ventricle: SMOKE seen,( low flow state). Left ventricular ejection fraction, by estimation, is <20%. The left ventricle has severely decreased function. The left ventricle demonstrates global hypokinesis. The left ventricular internal cavity size was severely dilated. There is mild concentric left ventricular hypertrophy. Left ventricular diastolic parameters are indeterminate. Right Ventricle: The right ventricular size is severely enlarged. Mildly increased right ventricular wall thickness. Right ventricular systolic function is severely reduced. Left Atrium: SMOKE seen. Left atrial size was severely dilated. Right Atrium: Right atrial size was severely dilated. Pericardium: There is no evidence of pericardial effusion. Mitral Valve:  The mitral valve is myxomatous. Mild mitral valve regurgitation. Tricuspid Valve: The tricuspid valve is grossly normal. Tricuspid valve regurgitation is mild. Aortic Valve: The aortic  valve is calcified. Aortic valve regurgitation is not visualized. Aortic valve mean gradient measures 1.0 mmHg. Aortic valve peak gradient measures 2.5 mmHg. Aortic valve area, by VTI measures 2.45 cm. Pulmonic Valve: The pulmonic valve was grossly normal. Pulmonic valve regurgitation is trivial. Aorta: Aortic dilatation noted. IAS/Shunts: No atrial level shunt detected by color flow Doppler.  LEFT VENTRICLE PLAX 2D LVIDd:         5.30 cm      Diastology LVIDs:         5.00 cm      LV e' medial:    7.83 cm/s LV PW:         1.20 cm      LV E/e' medial:  13.7 LV IVS:        1.00 cm      LV e' lateral:   7.51 cm/s LVOT diam:     2.00 cm      LV E/e' lateral: 14.2 LV SV:         27 LV SV Index:   18 LVOT Area:     3.14 cm  LV Volumes (MOD) LV vol d, MOD A2C: 133.0 ml LV vol d, MOD A4C: 158.0 ml LV vol s, MOD A2C: 116.0 ml LV vol s, MOD A4C: 133.0 ml LV SV MOD A2C:     17.0 ml LV SV MOD A4C:     158.0 ml LV SV MOD BP:      20.5 ml RIGHT VENTRICLE RV Basal diam:  3.70 cm RV Mid diam:    3.40 cm RV S prime:     9.25 cm/s TAPSE (M-mode): 2.4 cm LEFT ATRIUM             Index        RIGHT ATRIUM           Index LA diam:        3.70 cm 2.39 cm/m   RA Area:     14.80 cm LA Vol (A2C):   74.2 ml 47.94 ml/m  RA Volume:   40.60 ml  26.23 ml/m LA Vol (A4C):   44.4 ml 28.69 ml/m LA Biplane Vol: 57.9 ml 37.41 ml/m  AORTIC VALVE AV Area (Vmax):    1.92 cm AV Area (Vmean):   2.22 cm AV Area (VTI):     2.45 cm AV Vmax:           79.70 cm/s AV Vmean:          53.400 cm/s AV VTI:            0.111 m AV Peak Grad:      2.5 mmHg AV Mean Grad:      1.0 mmHg LVOT Vmax:         48.70 cm/s LVOT Vmean:        37.700 cm/s LVOT VTI:          0.087 m LVOT/AV VTI ratio: 0.78  AORTA Ao Root diam: 2.90 cm MITRAL VALVE                TRICUSPID VALVE MV Area  (PHT): 5.06 cm     TR Peak grad:   28.3 mmHg MV Decel Time: 150 msec     TR Vmax:        266.00 cm/s MV E velocity: 107.00 cm/s  SHUNTS                             Systemic VTI:  0.09 m                             Systemic Diam: 2.00 cm Neoma Laming Electronically signed by Neoma Laming Signature Date/Time: 06/13/2022/11:47:18 AM    Final    DG Chest Port 1 View  Result Date: 06/12/2022 CLINICAL DATA:  Increased bilateral leg swelling EXAM: PORTABLE CHEST 1 VIEW COMPARISON:  06/07/2022 FINDINGS: Bilateral diffuse mild interstitial thickening. No pleural effusion or pneumothorax. No focal consolidation. Stable mild cardiomegaly. No acute osseous abnormality. IMPRESSION: 1. Stable cardiomegaly. 2. Bilateral mild interstitial thickening which may reflect underlying chronic interstitial disease versus superimposed interstitial infection or mild interstitial edema. Electronically Signed   By: Kathreen Devoid M.D.   On: 06/12/2022 08:24   DG Chest 2 View  Result Date: 06/07/2022 CLINICAL DATA:  Shortness of breath, lower extremity swelling, history of CHF. EXAM: CHEST - 2 VIEW COMPARISON:  Chest x-rays dated 01/06/2022, 12/31/2021 and 06/13/2021. FINDINGS: Stable cardiomegaly. Persistent reticulonodular opacities bilaterally, basilar predominant, similar to the appearance/distribution on chest CT of 12/31/2021. No new lung findings. No pleural effusion or pneumothorax is seen. Osseous structures about the chest are unremarkable. IMPRESSION: 1. Stable cardiomegaly. 2. Persistent reticulonodular opacities bilaterally, basilar predominant, similar to the appearance/distribution on previous chest x-rays and the chest CT angiogram of 12/31/2021, compatible with chronic interstitial lung disease or atypical waxing and waning infectious process as suggested on the chest CT angiogram report. 3. No new findings. No evidence of a superimposed consolidating pneumonia. Electronically Signed   By:  Franki Cabot M.D.   On: 06/07/2022 10:15    Microbiology: Results for orders placed or performed during the hospital encounter of 01/06/22  Culture, blood (Routine x 2)     Status: None   Collection Time: 01/06/22  9:46 AM   Specimen: BLOOD  Result Value Ref Range Status   Specimen Description BLOOD LEFT ANTECUBITAL  Final   Special Requests   Final    BOTTLES DRAWN AEROBIC AND ANAEROBIC Blood Culture adequate volume   Culture   Final    NO GROWTH 5 DAYS Performed at [facility], [place] [address], [place], [place] [address]    Report Status 01/11/2022 FINAL  Final  Culture, blood (Routine x 2)     Status: None   Collection Time: 01/06/22  9:51 AM   Specimen: BLOOD  Result Value Ref Range Status   Specimen Description BLOOD BLOOD RIGHT ARM  Final   Special Requests   Final    BOTTLES DRAWN AEROBIC AND ANAEROBIC Blood Culture adequate volume   Culture   Final    NO GROWTH 5 DAYS Performed at [facility], [place] [address] [place], [place] [address]    Report Status 01/11/2022 FINAL  Final    Labs: CBC: Recent Labs  Lab 06/12/22 0631  WBC 9.1  HGB 11.4*  HCT 34.7*  MCV 92.8  PLT 165   Basic Metabolic Panel: Recent Labs  Lab 06/12/22 0631 06/13/22 0557 06/14/22 0452 06/15/22 0507  NA 137 138 139 138  K 2.9* 3.8 4.0 4.2  CL 99 102 103 103  CO2 _0 GLUCOSE 437* 223* 195* 219*  BUN _1 CREATININE 0.69 0.73 0.77 0.78  CALCIUM 8.1* 7.8* 8.0* 8.0*  MG  --   --  1.6* 2.1   Liver Function Tests: No results for input(s): "AST", "ALT", "ALKPHOS", "BILITOT", "PROT", "ALBUMIN" in the last 168 hours. CBG: Recent Labs  Lab 06/14/22 0819 06/14/22 1159 06/14/22 1624 06/14/22 2051 06/15/22 0718  GLUCAP 249* 135* 276* 210* 219*    Discharge time spent: greater than 30 minutes.  Signed: Loletha Grayer, MD [facility] 06/15/2022

## 2022-06-16 ENCOUNTER — Ambulatory Visit: Payer: Medicaid Other | Admitting: Family

## 2022-06-19 ENCOUNTER — Ambulatory Visit: Payer: Medicaid Other | Admitting: Family

## 2022-06-19 ENCOUNTER — Telehealth: Payer: Self-pay | Admitting: Family

## 2022-06-19 NOTE — Telephone Encounter (Signed)
Patient did not show for her Heart Failure Clinic appointment on 06/19/22. Will attempt to reschedule.   This is the 6th appointment that she has missed.

## 2022-06-24 ENCOUNTER — Ambulatory Visit: Payer: Medicaid Other | Admitting: Family

## 2022-06-24 ENCOUNTER — Telehealth: Payer: Self-pay | Admitting: Family

## 2022-06-24 NOTE — Telephone Encounter (Signed)
Patient did not show for her Heart Failure Clinic appointment on 06/24/22 even after patient confirmed. Will attempt to reschedule.    This is the 7th appointment she has missed.

## 2022-06-24 NOTE — Progress Notes (Deleted)
Patient ID: Cheryl Mercer, female    DOB: 1985/12/21, 36 y.o.   MRN: [id number]  HPI  Cheryl Mercer is a 36 y/o female with a history of DM, HTN, chronic back pain, migraines, PE and chronic heart failure.   Echo report from 06/13/22 showed an EF of <20% with mild LVH, severe RV size, severe LAE, severe RAE and mild MR. Echo report from 01/01/22 reviewed and showed an EF of 25-30% along with mild MR. Echo report from 06/15/21 reviewed and showed an EF of 30-35% along with moderate LAE, mild LVH and mild MR.   RHC/LHC done 03/25/2019 and showed: Ao = 122/88 (104) LV = 125/15 RA = 2 RV = 35/6 PA = 37/14 (25) PCW = 18 Fick cardiac output/index = 5.3/3.2 SVR = 1543 PVR = 1.3 WU Ao sat = 99% PA sat = 74%, 76%  Assessment: 1. Normal coronary arteries 2. 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 Well-compensated hemodynamics  Admitted 06/12/22 due to lower extremity edema and some shortness of breath due to HF exacerbation. Initially given IV lasix with transition to oral diuretics. Cardiology consult obtained. Carvedilol and aldactone doses reduced due to hypotension. IV magnesium given. Discharged after 3 days. Was in the ED 06/07/22 due to HF exacerbation where she was treated and released. Was in the ED 01/06/22 due to chest/abdominal pain. Evaluated and released. Admitted 12/31/21 due to 2 to 3-day history of cough with green phlegm with shortness of breath. She had a fever of 103.4 and tachycardic. IVF given for hypotension. Cardiology consult obtained. + for rhinovirus. Given IV antibiotics due to sepsis with transition to oral meds. Initially given IV lasix with transition to oral diuretics. Discharged after 2 days.   She presents today for a follow-up visit with a chief complaint of   Past Medical History:  Diagnosis Date   Chronic back pain    Chronic combined systolic and diastolic congestive heart failure ([place]) 09/07/2019   Diabetes mellitus without complication ([place])    History of COVID-19  11/07/2019   Migraines    Peripheral neuropathy    Pregnancy induced hypertension    Pulmonary embolus ([place]) 12/16/2019   Pulmonary infarct ([place]) 12/17/2019   Past Surgical History:  Procedure Laterality Date   CERVICAL BIOPSY  W/ LOOP ELECTRODE EXCISION  2009   Laparoscopic tubal sterilization with Falope rings.  2009   RIGHT/LEFT HEART CATH AND CORONARY ANGIOGRAPHY N/A 03/25/2019   Procedure: RIGHT/LEFT HEART CATH AND CORONARY ANGIOGRAPHY;  Surgeon: Jolaine Artist, MD;  Location: [facility];  Service: Cardiovascular;  Laterality: N/A;   TUBAL LIGATION     Family History  Problem Relation Age of Onset   Hypertension Mother    Anesthesia problems Neg Hx    Social History   Tobacco Use   Smoking status: Never   Smokeless tobacco: Never  Substance Use Topics   Alcohol use: No    Alcohol/week: 6.0 standard drinks of alcohol    Types: 6 Shots of liquor per week   Allergies  Allergen Reactions   Ibuprofen Anaphylaxis    Review of Systems  Constitutional:  Positive for fatigue. Negative for appetite change.  HENT:  Negative for congestion, postnasal drip and sore throat.   Eyes: Negative.   Respiratory:  Positive for cough and shortness of breath.   Cardiovascular:  Positive for palpitations and leg swelling ("much better"). Negative for chest pain.  Gastrointestinal:  Negative for abdominal distention and abdominal pain.  Endocrine: Negative.  Genitourinary: Negative.   Musculoskeletal:  Positive for arthralgias (left side of ribs). Negative for back pain.  Skin: Negative.   Allergic/Immunologic: Negative.   Neurological:  Positive for numbness (& tingling in feet). Negative for dizziness and light-headedness.  Hematological:  Negative for adenopathy. Does not bruise/bleed easily.  Psychiatric/Behavioral:  Positive for sleep disturbance (sleeping on 1.5-2 pillows). Negative for dysphoric mood. The patient is not nervous/anxious.       Physical Exam Vitals  and nursing note reviewed. Exam conducted with a chaperone present (2 kids).  Constitutional:      Appearance: Normal appearance.  HENT:     Head: Normocephalic and atraumatic.  Cardiovascular:     Rate and Rhythm: Regular rhythm. Tachycardia present.  Pulmonary:     Effort: Pulmonary effort is normal. No respiratory distress.     Breath sounds: No wheezing, rhonchi or rales.  Abdominal:     General: There is no distension.     Palpations: Abdomen is soft.  Musculoskeletal:        General: No tenderness.     Cervical back: Normal range of motion and neck supple.     Right lower leg: No tenderness. Edema (trace pitting) present.     Left lower leg: No tenderness. Edema (trace pitting) present.  Skin:    General: Skin is warm and dry.  Neurological:     General: No focal deficit present.     Mental Status: She is alert and oriented to person, place, and time.  Psychiatric:        Mood and Affect: Mood normal.        Behavior: Behavior normal.        Thought Content: Thought content normal.    Assessment & Plan:  1: Chronic heart failure with reduced ejection fraction- - NYHA class II - euvolemic today - weighing daily; reminded to call for an overnight weight gain of > 2 pounds or a weekly weight gain of >5 pounds - weight 124.2 pounds from last visit here 6 months ago - says that she's keeping her daily fluid intake to 60 ounces  - not adding salt to her food - on GDMT of carvedilol, entresto and spironolactone & jardiance - referral placed to [facility] cardiology to get established (patient's choice as they are in the same building)  - BNP 06/12/22 was 867.5   2: HTN- - BP  - has PCP scheduled with [facility] for July - BMP 06/15/22 reviewed and showed sodium 138, potassium 4.2, creatinine 0.78 & GFR >60 - recheck BMP today  3: DM- - glucose in clinic this morning was  - A1c 06/13/22 was 11.1%  4: Lymphedema-  - resolved   Patient did not bring her medications  nor a list. Each medication was verbally reviewed with the patient and she was encouraged to bring the bottles to every visit to confirm accuracy of list.

## 2022-07-03 ENCOUNTER — Ambulatory Visit: Payer: Medicare Other | Attending: Cardiovascular Disease | Admitting: Cardiovascular Disease

## 2022-07-03 NOTE — Progress Notes (Deleted)
Cardiology Office Note   Date:  07/03/2022   ID:  ARNITRA SOKOLOSKI, DOB 1986/06/18, MRN [id number]  PCP:  [facility], Ander Gaster, MD  Cardiologist:  Kathlyn Sacramento, MD   No chief complaint on file.     History of Present Illness: Cheryl Mercer is a 36 y.o. female who presents for ***    Past Medical History:  Diagnosis Date   Chronic back pain    Chronic combined systolic and diastolic congestive heart failure ([place]) 09/07/2019   Diabetes mellitus without complication ([place])    History of COVID-19 11/07/2019   Migraines    Peripheral neuropathy    Pregnancy induced hypertension    Pulmonary embolus ([place]) 12/16/2019   Pulmonary infarct ([place]) 12/17/2019    Past Surgical History:  Procedure Laterality Date   CERVICAL BIOPSY  W/ LOOP ELECTRODE EXCISION  2009   Laparoscopic tubal sterilization with Falope rings.  2009   RIGHT/LEFT HEART CATH AND CORONARY ANGIOGRAPHY N/A 03/25/2019   Procedure: RIGHT/LEFT HEART CATH AND CORONARY ANGIOGRAPHY;  Surgeon: Jolaine Artist, MD;  Location: [facility];  Service: Cardiovascular;  Laterality: N/A;   TUBAL LIGATION       Current Outpatient Medications  Medication Sig Dispense Refill   atorvastatin (LIPITOR) 40 MG tablet Take 1 tablet (40 mg total) by mouth daily at 6 PM. 30 tablet 0   blood glucose meter kit and supplies KIT Dispense based on patient and insurance preference. Check your sugars prior to meals and at bedtime 1 each 0   carvedilol (COREG) 3.125 MG tablet Take 1 tablet (3.125 mg total) by mouth 2 (two) times daily with a meal. 60 tablet 0   empagliflozin (JARDIANCE) 10 MG TABS tablet Take 1 tablet (10 mg total) by mouth daily before breakfast. 30 tablet 0   gabapentin (NEURONTIN) 100 MG capsule Take 1 capsule (100 mg total) by mouth 3 (three) times daily. 90 capsule 0   insulin glargine (LANTUS) 100 UNIT/ML Solostar Pen Inject 40 Units into the skin daily. 15 mL 0   insulin lispro (HUMALOG  KWIKPEN) 100 UNIT/ML KwikPen Inject 2-15 Units into the skin 3 (three) times daily after meals. CBG 121 - 150: 2 units, 151 - 200: 3 units, 201 - 250: 5 units, 251 - 300: 8 units, 301 - 350: 11 units, 351 - 400: 15u 15 mL 0   Insulin Pen Needle 33G X 5 MM MISC 1 Dose by Does not apply route 3 (three) times daily before meals. 200 each 0   potassium chloride SA (KLOR-CON M) 10 MEQ tablet Take 1 tablet (10 mEq total) by mouth daily. 30 tablet 0   sacubitril-valsartan (ENTRESTO) 24-26 MG Take 1 tablet by mouth 2 (two) times daily. 60 tablet 0   spironolactone (ALDACTONE) 25 MG tablet Take 0.5 tablets (12.5 mg total) by mouth daily. 15 tablet 0   torsemide (DEMADEX) 20 MG tablet Take one tablet daily (if you gain 3lbs in a day or five pounds in a week can take an extra pill in the afternoon) 60 tablet 0   No current facility-administered medications for this visit.    Allergies:   Ibuprofen    Social History:  The patient  reports that she has never smoked. She has never used smokeless tobacco. She reports that she does not drink alcohol and does not use drugs.   Family History:  The patient's ***family history includes Hypertension in her mother.    ROS:  Please see the  history of present illness.   Otherwise, review of systems are positive for {NONE DEFAULTED:18576}.   All other systems are reviewed and negative.    PHYSICAL EXAM: VS:  LMP 04/18/2022 (Approximate)  , BMI There is no height or weight on file to calculate BMI. GEN: Well nourished, well developed, in no acute distress  HEENT: normal  Neck: no JVD, carotid bruits, or masses Cardiac: ***RRR; no murmurs, rubs, or gallops,no edema  Respiratory:  clear to auscultation bilaterally, normal work of breathing GI: soft, nontender, nondistended, + BS MS: no deformity or atrophy  Skin: warm and dry, no rash Neuro:  Strength and sensation are intact Psych: euthymic mood, full affect   EKG:  EKG {ACTION; IS/IS YEL:[id number]} ordered  today. The ekg ordered today demonstrates ***   Recent Labs: 01/06/2022: ALT 24 06/12/2022: B Natriuretic Peptide 867.5; Hemoglobin 11.4; Platelets 365 06/15/2022: BUN 16; Creatinine, Ser 0.78; Magnesium 2.1; Potassium 4.2; Sodium 138    Lipid Panel    Component Value Date/Time   CHOL 135 07/10/2020 0550   TRIG 100 07/10/2020 0550   HDL 40 (L) 07/10/2020 0550   CHOLHDL 3.4 07/10/2020 0550   VLDL 20 07/10/2020 0550   LDLCALC 75 07/10/2020 0550      Wt Readings from Last 3 Encounters:  06/15/22 131 lb 6.3 oz (59.6 kg)  06/07/22 144 lb 6.4 oz (65.5 kg)  01/14/22 124 lb 2 oz (56.3 kg)      Other studies Reviewed: Additional studies/ records that were reviewed today include: ***. Review of the above records demonstrates: ***      No data to display            ASSESSMENT AND PLAN:  1.  ***    Disposition:   FU with *** in {gen number [id number]:[id number]} {Days to years:10300}  Signed,  Kathlyn Sacramento, MD  07/03/2022 8:55 AM    [place]

## 2022-07-04 ENCOUNTER — Encounter: Payer: Self-pay | Admitting: Cardiovascular Disease

## 2022-07-25 LAB — HISTOPLASMA ANTIGEN, URINE: Histoplasma Antigen, urine: <0.5 (ref <0.5)

## 2022-09-09 ENCOUNTER — Emergency Department
Admission: EM | Admit: 2022-09-09 | Discharge: 2022-09-09 | Disposition: A | Discharge status: Home / Self Care | Payer: 59 | Attending: Emergency Medicine | Admitting: Emergency Medicine

## 2022-09-09 ENCOUNTER — Encounter: Payer: Self-pay | Admitting: *Deleted

## 2022-09-09 ENCOUNTER — Other Ambulatory Visit: Payer: Self-pay

## 2022-09-09 DIAGNOSIS — M7989 Other specified soft tissue disorders: Secondary | ICD-10-CM | POA: Diagnosis present

## 2022-09-09 DIAGNOSIS — I509 Heart failure, unspecified: Secondary | ICD-10-CM | POA: Diagnosis not present

## 2022-09-09 DIAGNOSIS — R600 Localized edema: Secondary | ICD-10-CM | POA: Insufficient documentation

## 2022-09-09 LAB — CBC
HCT: 36.1 % (ref 36.0–46.0)
Hemoglobin: 12.4 g/dL (ref 12.0–15.0)
MCH: 30.6 pg (ref 26.0–34.0)
MCHC: 34.3 g/dL (ref 30.0–36.0)
MCV: 89.1 fL (ref 80.0–100.0)
Platelets: 271 10*3/uL (ref 150–400)
RBC: 4.05 MIL/uL (ref 3.87–5.11)
RDW: 15 % (ref 11.5–15.5)
WBC: 8.3 10*3/uL (ref 4.0–10.5)
nRBC: 0 % (ref 0.0–0.2)

## 2022-09-09 LAB — BASIC METABOLIC PANEL
Anion gap: 8 (ref 5–15)
BUN: 10 mg/dL (ref 6–20)
CO2: 28 mmol/L (ref 22–32)
Calcium: 7.9 mg/dL — ABNORMAL LOW (ref 8.9–10.3)
Chloride: 96 mmol/L — ABNORMAL LOW (ref 98–111)
Creatinine, Ser: 0.66 mg/dL (ref 0.44–1.00)
GFR, Estimated: >60 mL/min (ref >60)
Glucose, Bld: 405 mg/dL — ABNORMAL HIGH (ref 70–99)
Potassium: 3 mmol/L — ABNORMAL LOW (ref 3.5–5.1)
Sodium: 132 mmol/L — ABNORMAL LOW (ref 135–145)

## 2022-09-09 LAB — TROPONIN I (HIGH SENSITIVITY): Troponin I (High Sensitivity): 14 ng/L (ref <18)

## 2022-09-09 MED ORDER — TORSEMIDE 20 MG PO TABS
20.0000 mg | ORAL_TABLET | Freq: Once | ORAL | Status: AC
  Administered 2022-09-09: 20 mg via ORAL
  Filled 2022-09-09: qty 1

## 2022-09-09 NOTE — Discharge Instructions (Signed)
Please take twice your normal dosage of diuretic medicines for the next 7 days. Please keep track of the volume of liquids that you are intaking daily (anything that falls off a spoon when held upside down).  You may want to set a goal such as 2-2.5 L a day.

## 2022-09-09 NOTE — ED Triage Notes (Signed)
Pt has swelling to lower legs/feet for 2 days.  Pt not taking fluid pills for 2 days   no chest pain or sob.  Pt alert  speech  clear.

## 2022-09-09 NOTE — ED Provider Notes (Signed)
[facility] Provider Note   Event Date/Time   First MD Initiated Contact with Patient 09/09/22 1905     (approximate) History  Leg Swelling  HPI Cheryl Mercer is a 37 y.o. female with a stated past medical history of CHF who presents complaining of bilateral lower extremity swelling that has been worsening over the last 2 days in the setting of not taking her Lasix medication.  Patient states that she normally takes her Lasix approximately 2-3 times per week "when I remember".  Patient does not know what her goal weight is nor has she been keeping track of fluid intake per day. ROS: Patient currently denies any vision changes, tinnitus, difficulty speaking, facial droop, sore throat, chest pain, shortness of breath, abdominal pain, nausea/vomiting/diarrhea, dysuria, or weakness/numbness/paresthesias in any extremity   Physical Exam  Triage Vital Signs: ED Triage Vitals  Enc Vitals Group     BP 09/09/22 1812 (!) 150/137     Pulse Rate 09/09/22 1812 (!) 110     Resp 09/09/22 1812 18     Temp 09/09/22 1812 98.2 F (36.8 C)     Temp Source 09/09/22 1812 Oral     SpO2 09/09/22 1812 97 %     Weight 09/09/22 1809 140 lb (63.5 kg)     Height 09/09/22 1809 5\' 1"  (1.549 m)     Head Circumference --      Peak Flow --      Pain Score 09/09/22 1809 6     Pain Loc --      Pain Edu? --      Excl. in [place]? --    Most recent vital signs: Vitals:   09/09/22 2034 09/09/22 2222  BP: (!) 152/105 (!) 159/115  Pulse: (!) 107 (!) 105  Resp: 19 16  Temp: 98.4 F (36.9 C)   SpO2: 100% 99%   General: Awake, oriented x4. CV:  Good peripheral perfusion.  Resp:  Normal effort.  Abd:  No distention.  Other:  Middle-aged overweight Hispanic female laying in bed in no acute distress.  2+ pitting edema to bilateral lower extremities ED Results / Procedures / Treatments  Labs (all labs ordered are listed, but only abnormal results are displayed) Labs Reviewed  BASIC  METABOLIC PANEL - Abnormal; Notable for the following components:      Result Value   Sodium 132 (*)    Potassium 3.0 (*)    Chloride 96 (*)    Glucose, Bld 405 (*)    Calcium 7.9 (*)    All other components within normal limits  CBC  TROPONIN I (HIGH SENSITIVITY)   EKG ED ECG REPORT I, Naaman Plummer, the attending physician, personally viewed and interpreted this ECG. Date: 09/09/2022 EKG Time: 1814 Rate: 110 Rhythm: Tachycardic sinus rhythm QRS Axis: normal Intervals: normal ST/T Wave abnormalities: normal Narrative Interpretation: Tachycardic sinus rhythm.  No evidence of acute ischemia  PROCEDURES: Critical Care performed: No .1-3 Lead EKG Interpretation  Performed by: Naaman Plummer, MD Authorized by: Naaman Plummer, MD     Interpretation: normal     ECG rate:  71   ECG rate assessment: normal     Rhythm: sinus rhythm     Ectopy: none     Conduction: normal    MEDICATIONS ORDERED IN ED: Medications  torsemide (DEMADEX) tablet 20 mg (20 mg Oral Given 09/09/22 2216)   IMPRESSION / MDM / ASSESSMENT AND PLAN / ED COURSE  I reviewed the  triage vital signs and the nursing notes.                             The patient is on the cardiac monitor to evaluate for evidence of arrhythmia and/or significant heart rate changes. Patient's presentation is most consistent with acute presentation with potential threat to life or bodily function. 37 year old female with a  Endorses LE edema Endorses Non adherence to medication regimen  Workup: ECG, CBC, BMP, Troponin Findings: EKG: No STEMI and no evidence of Brugadas sign, delta wave, epsilon wave, significantly prolonged QTc, or malignant arrhythmia. Based on history, exam and findings, presentation most consistent with acute on chronic heart failure. Low suspicion for PNA, ACS, tamponade, aortic dissection. Interventions: Diuresis Patient in no respiratory distress and satting well on room air Disposition (Stable):  Discharge    FINAL CLINICAL IMPRESSION(S) / ED DIAGNOSES   Final diagnoses:  Bilateral lower extremity edema   Rx / DC Orders   ED Discharge Orders     None      Note:  This document was prepared using Dragon voice recognition software and may include unintentional dictation errors.   Naaman Plummer, MD 09/09/22 2227

## 2022-10-02 ENCOUNTER — Other Ambulatory Visit: Payer: Self-pay | Admitting: Family

## 2023-01-18 IMAGING — DX DG CHEST 1V PORT
1 series · 1 of 1 positions shown · non-contrast
Comparison: None Available.

CLINICAL DATA: Possible sepsis

EXAM:
PORTABLE CHEST 1 VIEW

[chest ap]
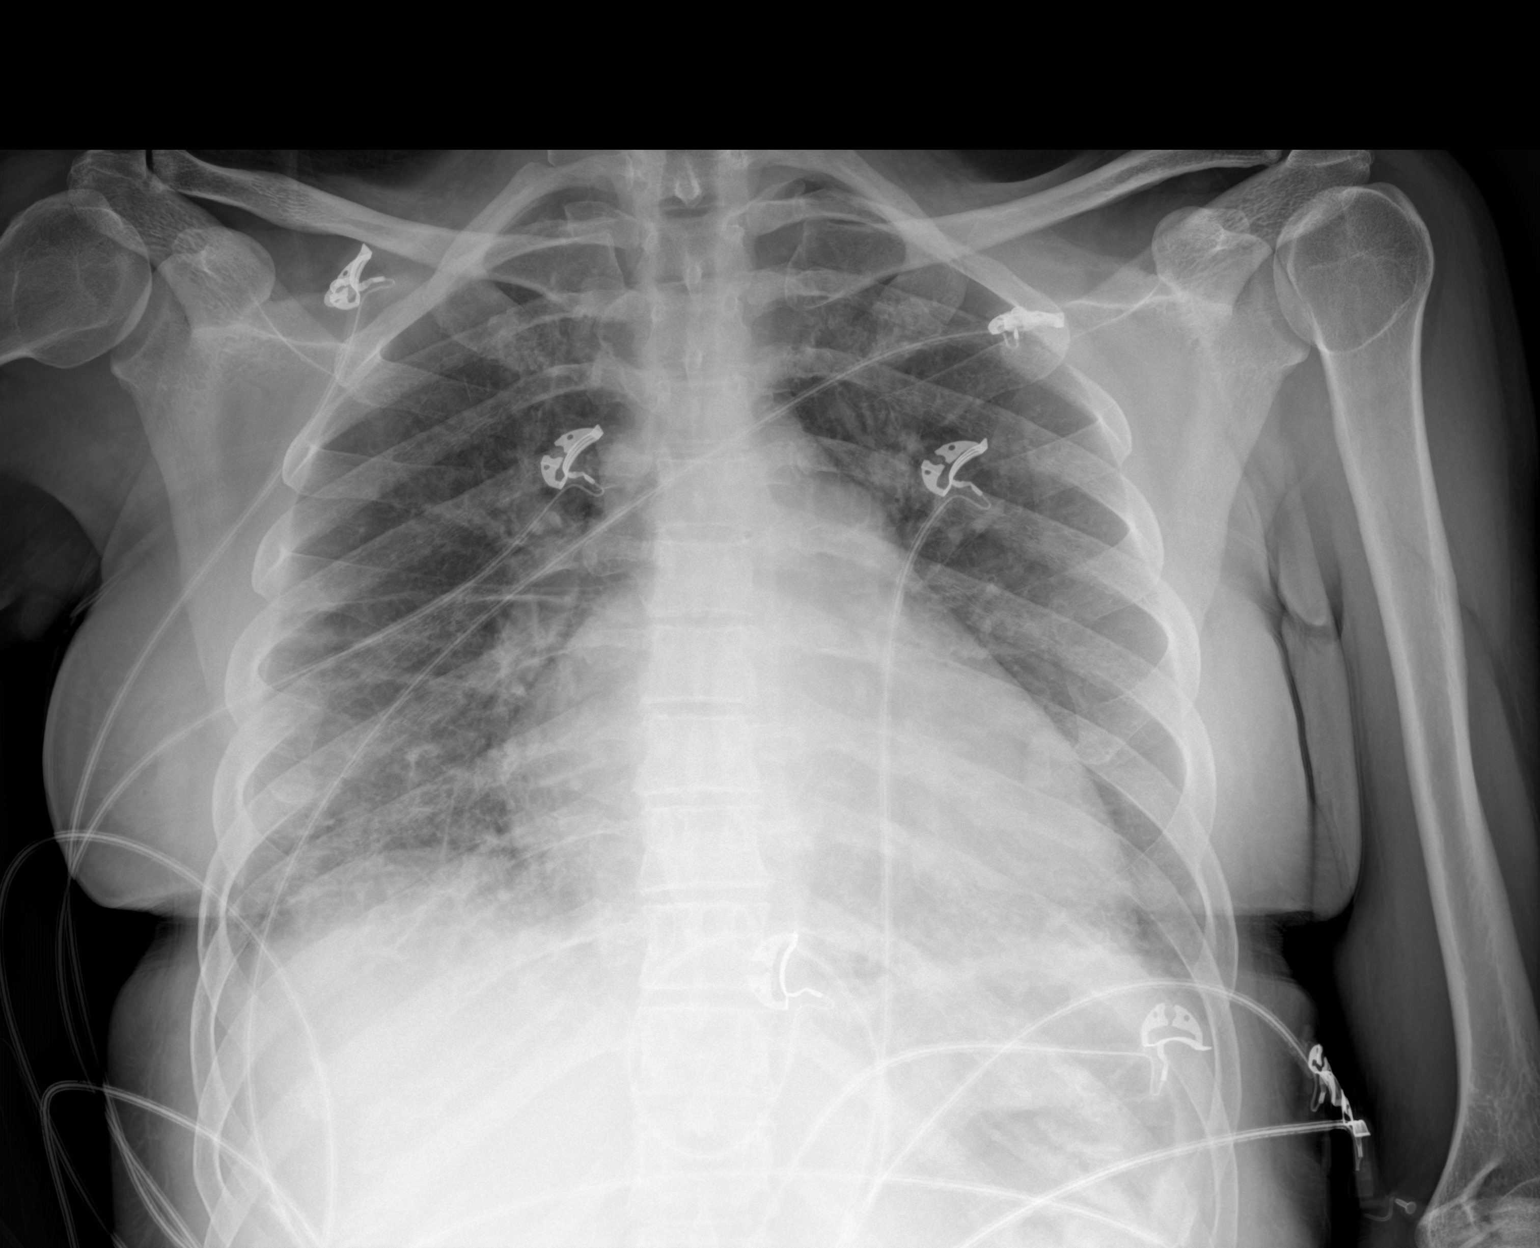

[1 of 1 positions shown; findings below may reference images not displayed]

FINDINGS: Cardiac shadow is within normal limits. The lungs are well aerated
bilaterally. Mild bibasilar atelectasis is seen. No focal confluent
infiltrate or effusion is noted. No bony abnormality is seen.
IMPRESSION: Mild bibasilar atelectasis.  Some

## 2023-01-18 IMAGING — CT CT ANGIO CHEST
2 of 6 series · 17 of 46 positions shown · IV contrast (APPLIED)
Comparison: Portable chest today, CTA chest 06/14/2021, CTA chest
07/02/2020.

CLINICAL DATA: Suspected pulmonary embolism. D-dimer unknown at
this time.

EXAM:
CT ANGIOGRAPHY CHEST WITH CONTRAST
TECHNIQUE: Multidetector CT imaging of the chest was performed using the
standard protocol during bolus administration of intravenous
contrast. Multiplanar CT image reconstructions and MIPs were
obtained to evaluate the vascular anatomy.

[Series 6: thins · axial · 0.69mm/px · z∈[-798,-565]mm · 14 of 365 slices shown]
[im 16/365  lung]
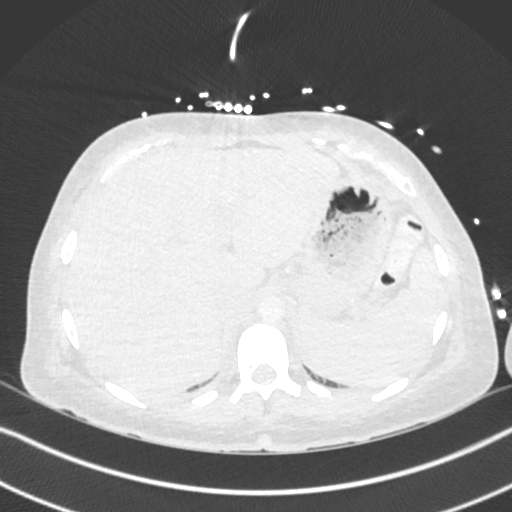
[im 48/365  soft-tissue]
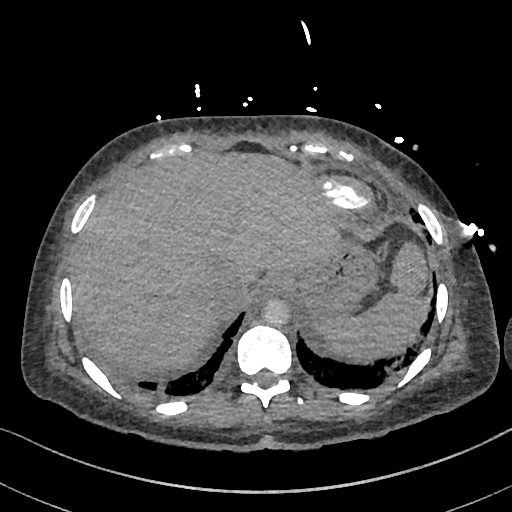
[im 64/365  lung]
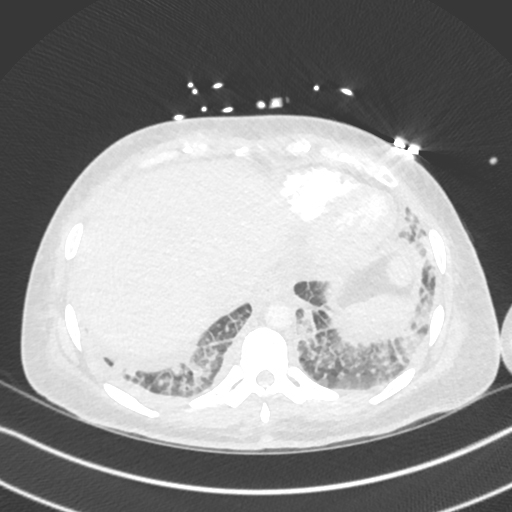
[im 95/365  soft-tissue]
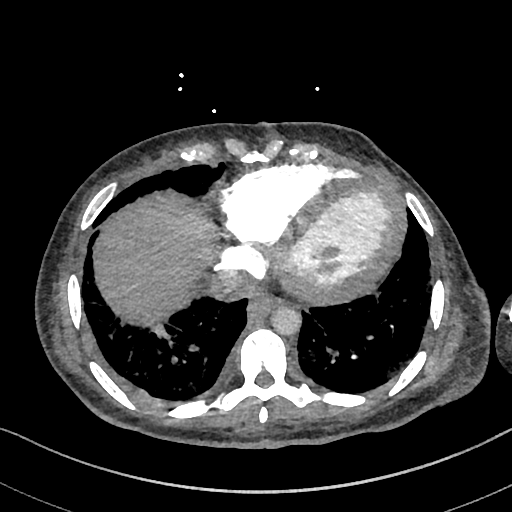
[im 127/365  lung]
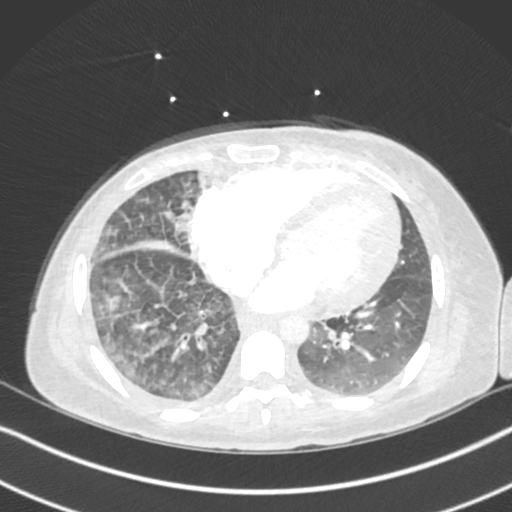
[im 143/365  soft-tissue]
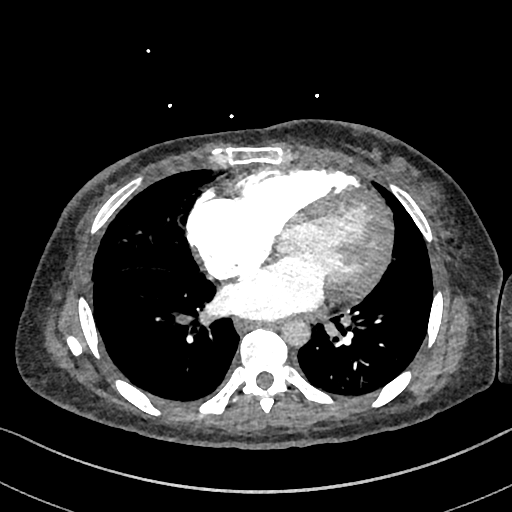
[im 175/365  lung]
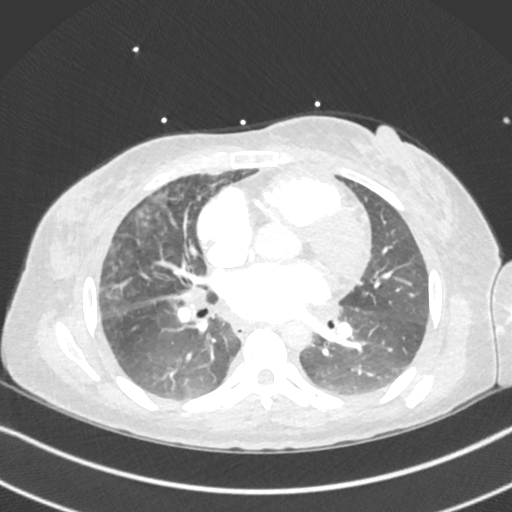
[im 190/365  soft-tissue]
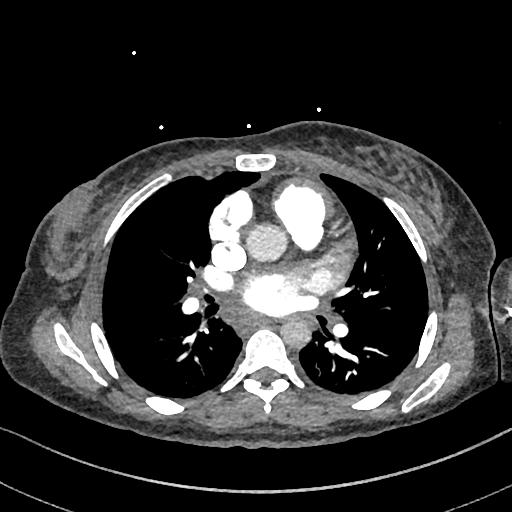
[im 222/365  lung]
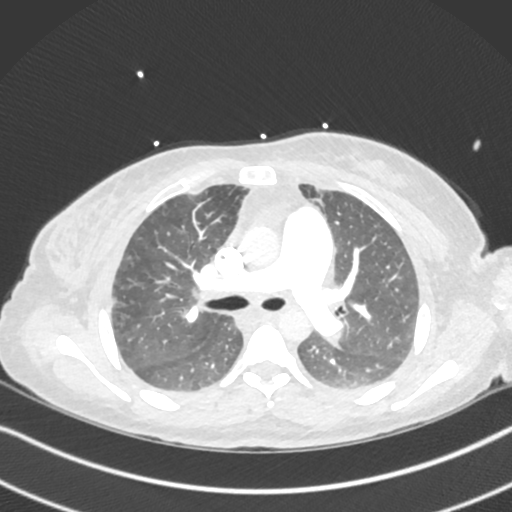
[im 238/365  soft-tissue]
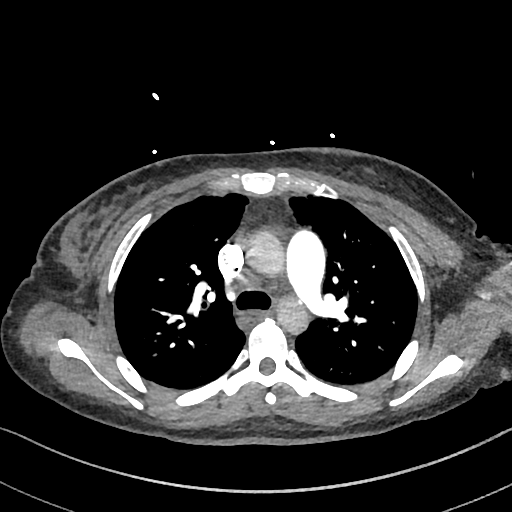
[im 270/365  lung]
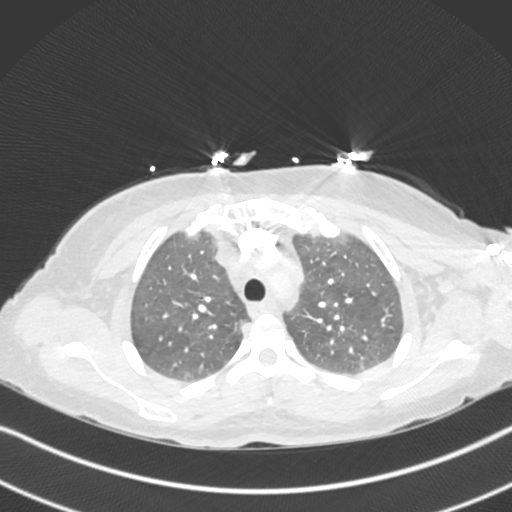
[im 301/365  soft-tissue]
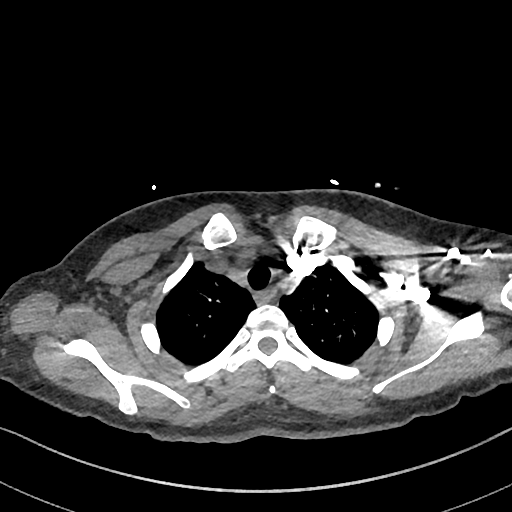
[im 317/365  lung]
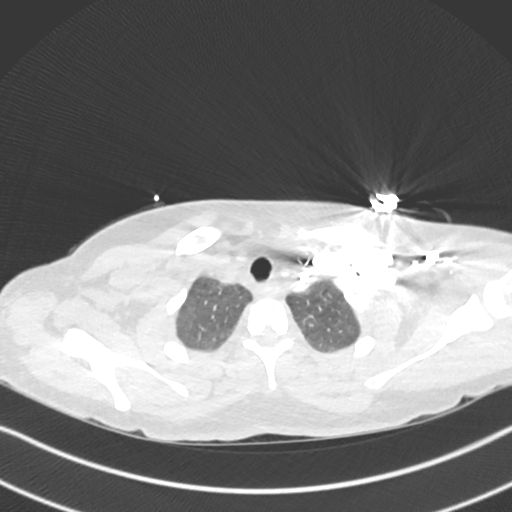
[im 349/365  soft-tissue]
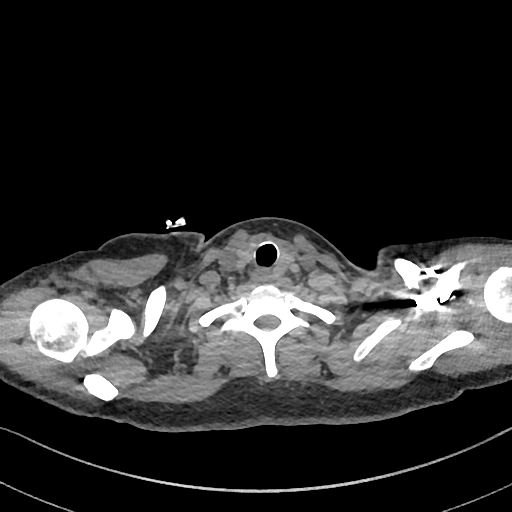

[Series 7: cor · coronal · 0.51mm/px · 3 of 111 slices shown]
[im 28/111  soft-tissue]
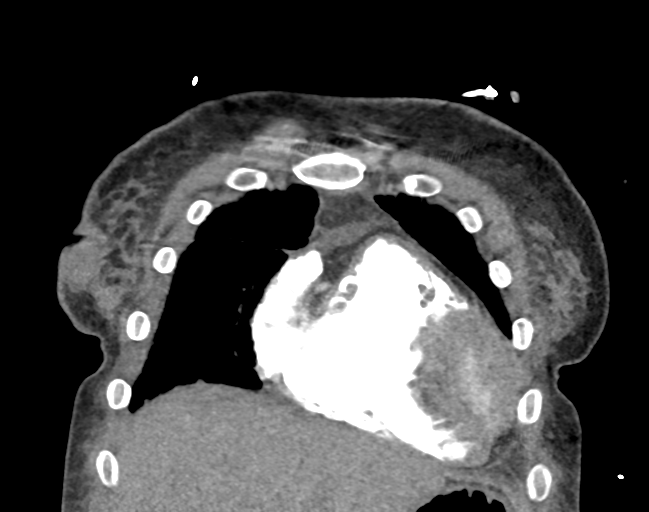
[im 56/111  soft-tissue]
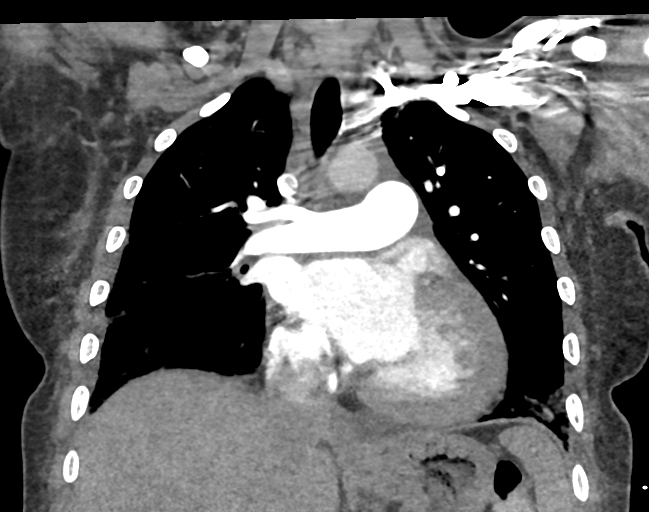
[im 83/111  soft-tissue]
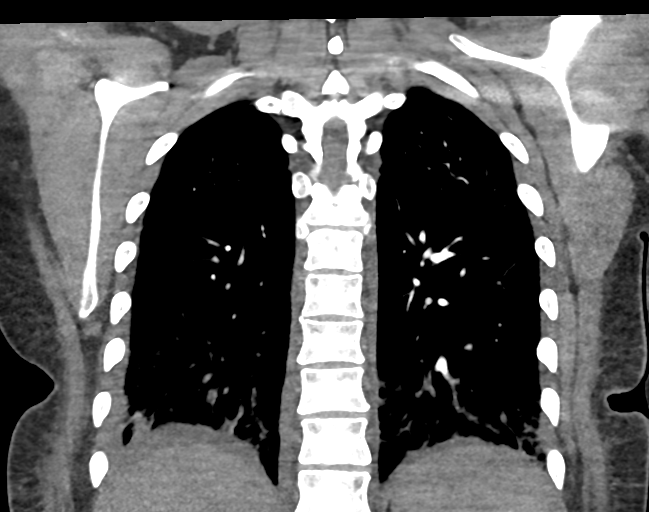

[17 of 46 positions shown; findings below may reference images not displayed]

RADIATION DOSE REDUCTION: This exam was performed according to the
departmental dose-optimization program which includes automated
exposure control, adjustment of the mA and/or kV according to
patient size and/or use of iterative reconstruction technique.

CONTRAST:  75mL OMNIPAQUE IOHEXOL 350 MG/ML SOLN
FINDINGS: Cardiovascular: There is moderate pan chamber cardiomegaly. There is
a prominent pulmonary trunk 3.4 cm indicating arterial hypertension.

No arterial filling defects are seen to the segmental level but the
subsegmental arterial bed is obscured by breathing motion.

There is no aortic aneurysm or visible atherosclerosis or
dissection. The great vessels are patent. Central pulmonary veins
are prominent but no more than previously. No coronary artery
calcification is seen.

Mediastinum/Nodes: There are mildly prominent mediastinal and hilar
lymph nodes with multiple mediastinal locations involved and was
seen previously including left-sided prevascular nodes of 1.1 cm in
short axis right paratracheal nodes up to 1.2 cm in short axis with
a similar sized bilateral hilar nodes, and subcarinal lymph nodes up
to 1.7 cm short axis.

The lymphadenopathy is unchanged compared with 06/14/2021 but the
lymph nodes are few mm larger than in 6862.

Visualized thyroid and axillary spaces are unremarkable. There is no
new intrathoracic adenopathy. Thoracic esophagus and trachea show no
focal abnormality.

Lungs/Pleura: There is interval development of a small right and
trace layering left pleural effusions. There is no pneumothorax.
There is worsening interstitial septal thickening in the
right-greater-than-left lower lung fields, increasing associated
ground-glass and small nodular opacities in both lower and right
middle lobes.

In the upper lobes there were previously numerous nodules which are
much decreased in numbers and with visible pre-existing nodules
decreased in size in the interval. There is a new nodule in the
lateral base of the right upper lobe measuring 9 mm.

Upper Abdomen: No acute abnormality.

Musculoskeletal: No acute or significant osseous findings.

Review of the MIP images confirms the above findings.
IMPRESSION: 1. Again noted prominence of the pulmonary trunk. No embolus through
the segmental divisions. Subsegmental arteries obscured by breathing
motion.
2. Panchamber cardiomegaly with prominent central pulmonary veins.
3. Small right and trace left pleural effusions with increased lower
zonal septal thickening and ground-glass/nodular disease, most
likely due to CHF or fluid overload.
4. Waxing and waning of chronic nodular disease also seen and may
suggest changes of atypical pulmonary edema or infectious process.
Pulmonary medicine consult recommended unless already done.
Underlying neoplasm is possible but less likely at this age.
5. Mildly enlarged mediastinal and hilar nodes.

## 2023-01-24 IMAGING — CR DG CHEST 2V
2 series · 2 of 2 positions shown · non-contrast
Comparison: Chest x-ray December 31, 21.

CLINICAL DATA: Suspected Sepsis

EXAM:
CHEST - 2 VIEW

[chest lat]
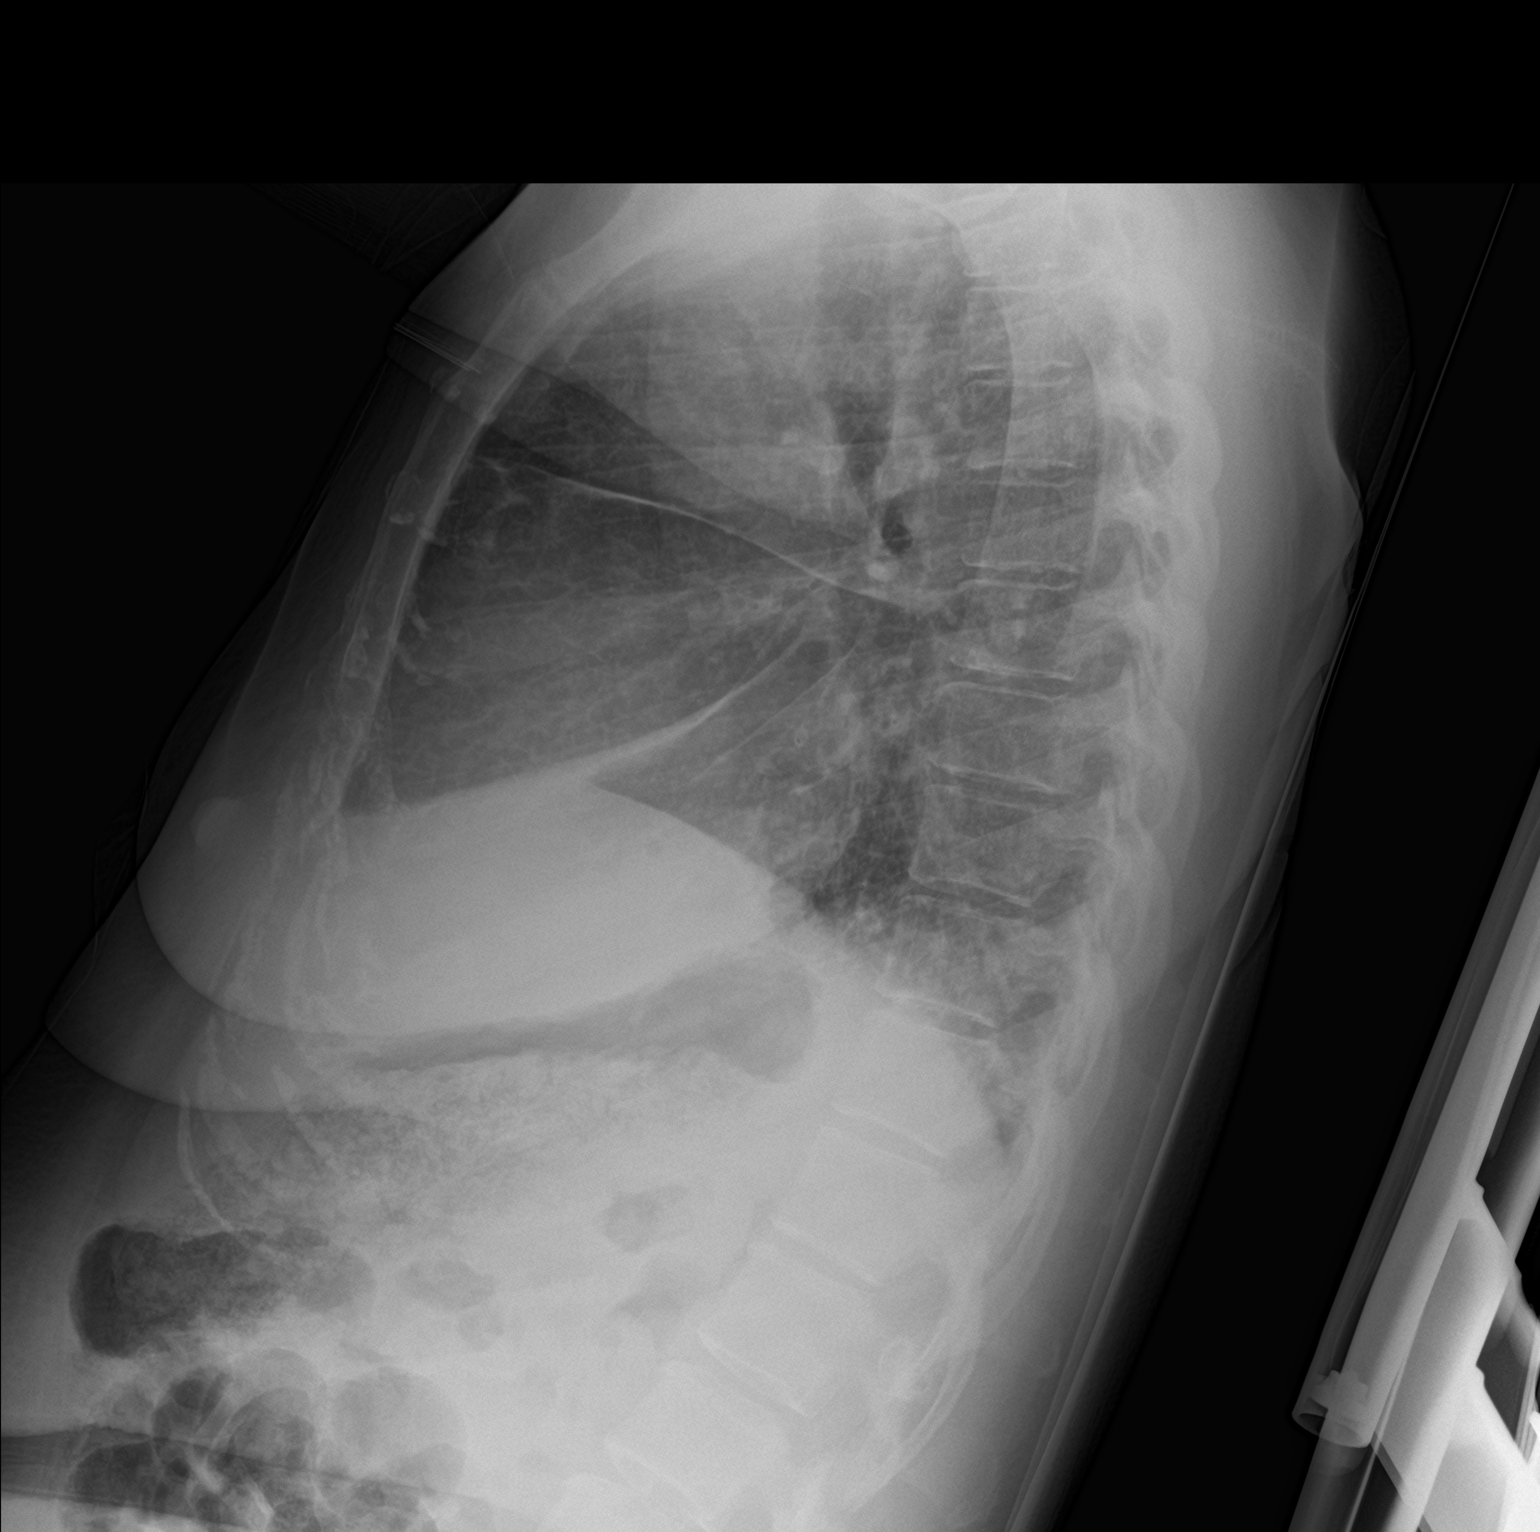

[chest ap]
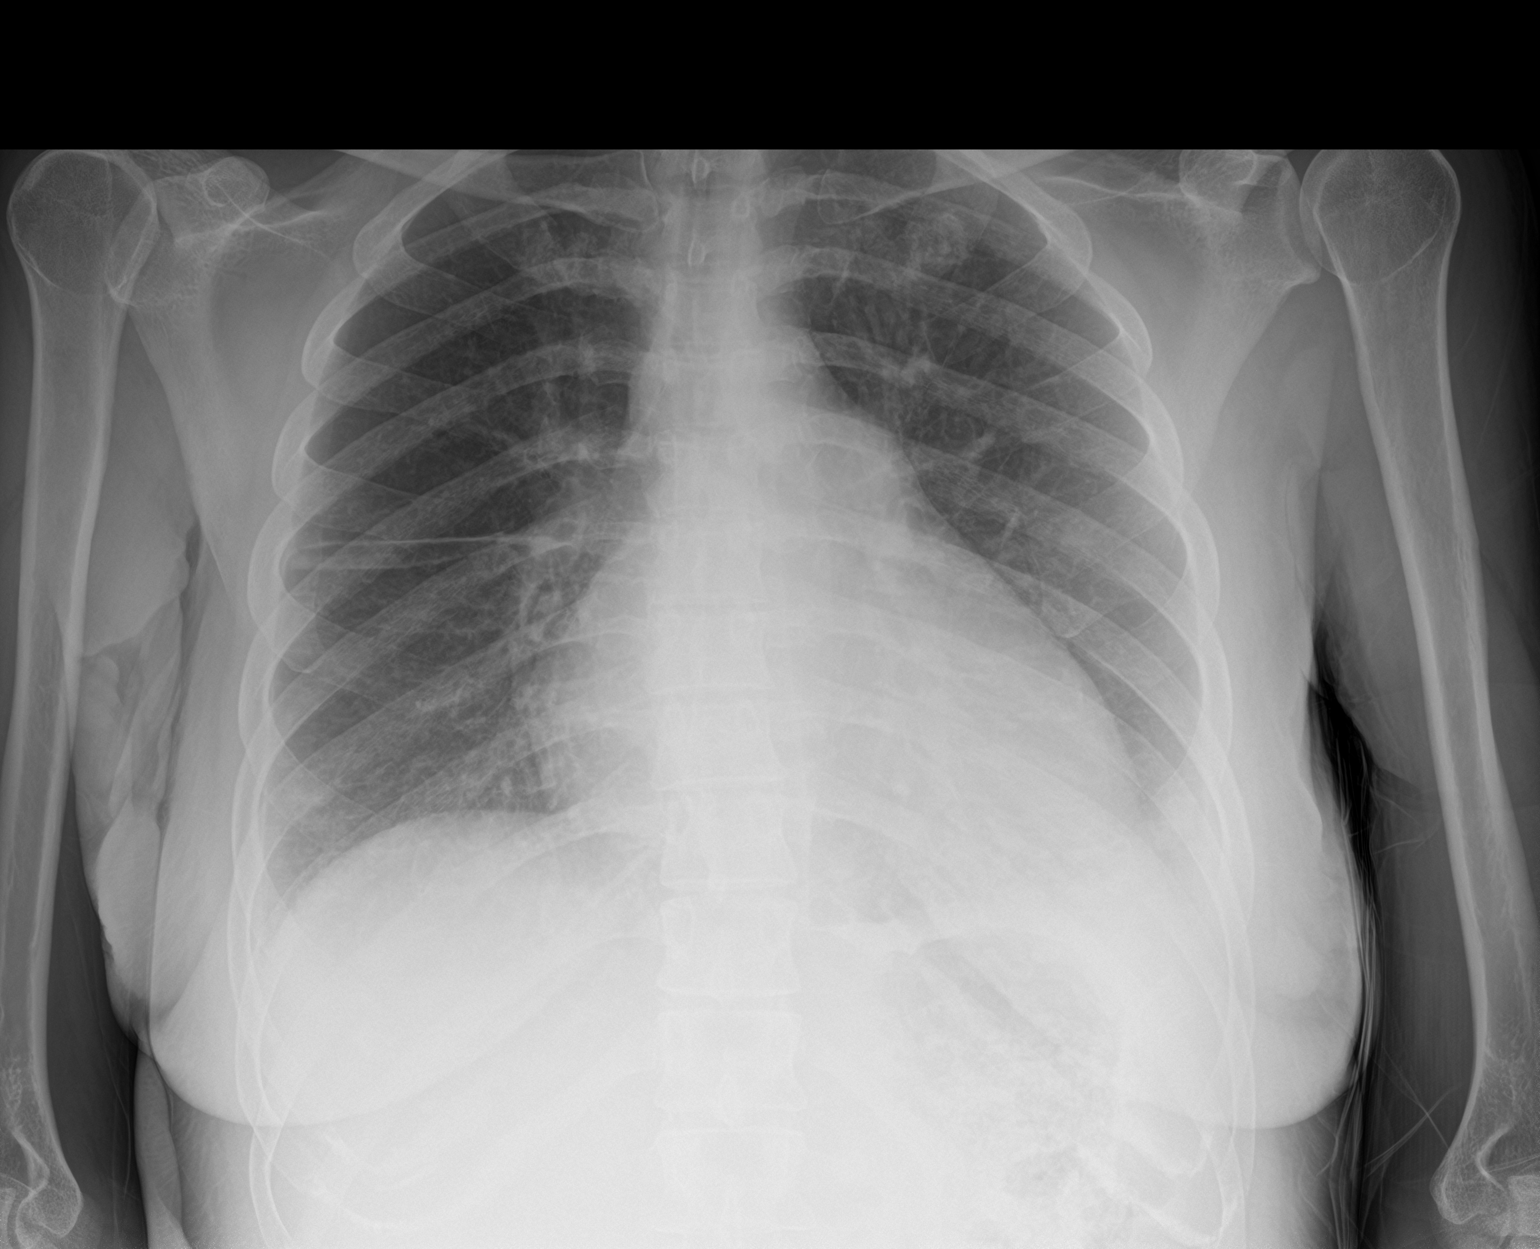

[2 of 2 positions shown; findings below may reference images not displayed]

FINDINGS: Enlarged cardiac silhouette. Small bilateral pleural effusions. Left
greater than right bibasilar opacities. No displaced fracture.
IMPRESSION: 1. Small bilateral pleural effusions. Overlying left greater than
right bibasilar opacities, which could represent atelectasis,
consolidation and/or edema.
2. Cardiomegaly.
3. Nodular disease better characterized on prior CT chest.

## 2023-02-12 ENCOUNTER — Other Ambulatory Visit: Payer: Self-pay

## 2023-02-12 ENCOUNTER — Encounter: Payer: Self-pay | Admitting: Emergency Medicine

## 2023-02-12 DIAGNOSIS — R600 Localized edema: Secondary | ICD-10-CM | POA: Insufficient documentation

## 2023-02-12 DIAGNOSIS — R2243 Localized swelling, mass and lump, lower limb, bilateral: Secondary | ICD-10-CM | POA: Diagnosis present

## 2023-02-12 DIAGNOSIS — Z5321 Procedure and treatment not carried out due to patient leaving prior to being seen by health care provider: Secondary | ICD-10-CM | POA: Diagnosis not present

## 2023-02-12 NOTE — ED Triage Notes (Addendum)
Patient endorses bilateral lower leg swelling x 2 weeks. Patient has not been taking lasix d/t not having it.

## 2023-02-13 ENCOUNTER — Emergency Department
Admission: EM | Admit: 2023-02-13 | Discharge: 2023-02-13 | Discharge status: Against Medical Advice | Payer: 59 | Attending: Emergency Medicine | Admitting: Emergency Medicine

## 2023-02-13 LAB — CBC WITH DIFFERENTIAL/PLATELET
Abs Immature Granulocytes: 0.05 10*3/uL (ref 0.00–0.07)
Basophils Absolute: 0.1 10*3/uL (ref 0.0–0.1)
Basophils Relative: 1 %
Eosinophils Absolute: 0.3 10*3/uL (ref 0.0–0.5)
Eosinophils Relative: 3 %
HCT: 37.5 % (ref 36.0–46.0)
Hemoglobin: 12.8 g/dL (ref 12.0–15.0)
Immature Granulocytes: 1 %
Lymphocytes Relative: 19 %
Lymphs Abs: 1.8 10*3/uL (ref 0.7–4.0)
MCH: 30.9 pg (ref 26.0–34.0)
MCHC: 34.1 g/dL (ref 30.0–36.0)
MCV: 90.6 fL (ref 80.0–100.0)
Monocytes Absolute: 0.5 10*3/uL (ref 0.1–1.0)
Monocytes Relative: 5 %
Neutro Abs: 6.8 10*3/uL (ref 1.7–7.7)
Neutrophils Relative %: 71 %
Platelets: 313 10*3/uL (ref 150–400)
RBC: 4.14 MIL/uL (ref 3.87–5.11)
RDW: 13.1 % (ref 11.5–15.5)
WBC: 9.4 10*3/uL (ref 4.0–10.5)
nRBC: 0 % (ref 0.0–0.2)

## 2023-02-13 LAB — COMPREHENSIVE METABOLIC PANEL
ALT: 12 U/L (ref 0–44)
AST: 34 U/L (ref 15–41)
Albumin: 2.6 g/dL — ABNORMAL LOW (ref 3.5–5.0)
Alkaline Phosphatase: 163 U/L — ABNORMAL HIGH (ref 38–126)
Anion gap: 12 (ref 5–15)
BUN: 11 mg/dL (ref 6–20)
CO2: 28 mmol/L (ref 22–32)
Calcium: 9.5 mg/dL (ref 8.9–10.3)
Chloride: 92 mmol/L — ABNORMAL LOW (ref 98–111)
Creatinine, Ser: 0.73 mg/dL (ref 0.44–1.00)
GFR, Estimated: >60 mL/min (ref >60)
Glucose, Bld: 411 mg/dL — ABNORMAL HIGH (ref 70–99)
Potassium: 3.1 mmol/L — ABNORMAL LOW (ref 3.5–5.1)
Sodium: 132 mmol/L — ABNORMAL LOW (ref 135–145)
Total Bilirubin: 0.7 mg/dL (ref 0.3–1.2)
Total Protein: 6.7 g/dL (ref 6.5–8.1)

## 2023-02-13 LAB — BRAIN NATRIURETIC PEPTIDE: B Natriuretic Peptide: 1242.5 pg/mL — ABNORMAL HIGH (ref 0.0–100.0)

## 2023-02-13 NOTE — ED Notes (Signed)
Patient called x 2 from lobby for room placement.  No answer from lobby

## 2023-02-13 NOTE — ED Notes (Signed)
No answer multiple times for room

## 2023-02-23 ENCOUNTER — Inpatient Hospital Stay (HOSPITAL_COMMUNITY): Payer: 59

## 2023-02-23 ENCOUNTER — Other Ambulatory Visit: Payer: Self-pay

## 2023-02-23 ENCOUNTER — Encounter (HOSPITAL_COMMUNITY): Payer: Self-pay

## 2023-02-23 ENCOUNTER — Inpatient Hospital Stay (HOSPITAL_COMMUNITY)
Admission: EM | Admit: 2023-02-23 | Discharge: 2023-02-26 | DRG: 291 | Disposition: A | Discharge status: Home / Self Care | Payer: 59 | Attending: Internal Medicine | Admitting: Internal Medicine

## 2023-02-23 ENCOUNTER — Emergency Department (HOSPITAL_COMMUNITY): Payer: 59

## 2023-02-23 DIAGNOSIS — Z8249 Family history of ischemic heart disease and other diseases of the circulatory system: Secondary | ICD-10-CM

## 2023-02-23 DIAGNOSIS — Z86711 Personal history of pulmonary embolism: Secondary | ICD-10-CM

## 2023-02-23 DIAGNOSIS — Z8679 Personal history of other diseases of the circulatory system: Secondary | ICD-10-CM | POA: Diagnosis not present

## 2023-02-23 DIAGNOSIS — I5021 Acute systolic (congestive) heart failure: Secondary | ICD-10-CM

## 2023-02-23 DIAGNOSIS — E785 Hyperlipidemia, unspecified: Secondary | ICD-10-CM | POA: Diagnosis present

## 2023-02-23 DIAGNOSIS — F32A Depression, unspecified: Secondary | ICD-10-CM | POA: Diagnosis present

## 2023-02-23 DIAGNOSIS — E0841 Diabetes mellitus due to underlying condition with diabetic mononeuropathy: Secondary | ICD-10-CM | POA: Diagnosis not present

## 2023-02-23 DIAGNOSIS — Z5986 Financial insecurity: Secondary | ICD-10-CM | POA: Diagnosis not present

## 2023-02-23 DIAGNOSIS — R252 Cramp and spasm: Secondary | ICD-10-CM | POA: Diagnosis not present

## 2023-02-23 DIAGNOSIS — Z8616 Personal history of COVID-19: Secondary | ICD-10-CM | POA: Diagnosis not present

## 2023-02-23 DIAGNOSIS — Z79899 Other long term (current) drug therapy: Secondary | ICD-10-CM

## 2023-02-23 DIAGNOSIS — I5043 Acute on chronic combined systolic (congestive) and diastolic (congestive) heart failure: Secondary | ICD-10-CM | POA: Diagnosis not present

## 2023-02-23 DIAGNOSIS — Z7984 Long term (current) use of oral hypoglycemic drugs: Secondary | ICD-10-CM

## 2023-02-23 DIAGNOSIS — Z886 Allergy status to analgesic agent status: Secondary | ICD-10-CM

## 2023-02-23 DIAGNOSIS — I10 Essential (primary) hypertension: Secondary | ICD-10-CM | POA: Diagnosis not present

## 2023-02-23 DIAGNOSIS — E1140 Type 2 diabetes mellitus with diabetic neuropathy, unspecified: Secondary | ICD-10-CM | POA: Diagnosis present

## 2023-02-23 DIAGNOSIS — I5023 Acute on chronic systolic (congestive) heart failure: Secondary | ICD-10-CM | POA: Diagnosis present

## 2023-02-23 DIAGNOSIS — E876 Hypokalemia: Secondary | ICD-10-CM | POA: Diagnosis present

## 2023-02-23 DIAGNOSIS — I083 Combined rheumatic disorders of mitral, aortic and tricuspid valves: Secondary | ICD-10-CM | POA: Diagnosis present

## 2023-02-23 DIAGNOSIS — I5082 Biventricular heart failure: Secondary | ICD-10-CM | POA: Diagnosis present

## 2023-02-23 DIAGNOSIS — N179 Acute kidney failure, unspecified: Secondary | ICD-10-CM | POA: Diagnosis present

## 2023-02-23 DIAGNOSIS — E1165 Type 2 diabetes mellitus with hyperglycemia: Secondary | ICD-10-CM | POA: Diagnosis present

## 2023-02-23 DIAGNOSIS — Z794 Long term (current) use of insulin: Secondary | ICD-10-CM | POA: Diagnosis not present

## 2023-02-23 DIAGNOSIS — F1090 Alcohol use, unspecified, uncomplicated: Secondary | ICD-10-CM | POA: Diagnosis present

## 2023-02-23 DIAGNOSIS — I422 Other hypertrophic cardiomyopathy: Secondary | ICD-10-CM | POA: Diagnosis present

## 2023-02-23 DIAGNOSIS — I509 Heart failure, unspecified: Principal | ICD-10-CM

## 2023-02-23 DIAGNOSIS — I2489 Other forms of acute ischemic heart disease: Secondary | ICD-10-CM | POA: Diagnosis present

## 2023-02-23 DIAGNOSIS — R7989 Other specified abnormal findings of blood chemistry: Secondary | ICD-10-CM | POA: Diagnosis present

## 2023-02-23 DIAGNOSIS — Z91148 Patient's other noncompliance with medication regimen for other reason: Secondary | ICD-10-CM

## 2023-02-23 DIAGNOSIS — Z91128 Patient's intentional underdosing of medication regimen for other reason: Secondary | ICD-10-CM

## 2023-02-23 DIAGNOSIS — I110 Hypertensive heart disease with heart failure: Secondary | ICD-10-CM | POA: Diagnosis present

## 2023-02-23 DIAGNOSIS — Z87892 Personal history of anaphylaxis: Secondary | ICD-10-CM | POA: Diagnosis not present

## 2023-02-23 DIAGNOSIS — I444 Left anterior fascicular block: Secondary | ICD-10-CM | POA: Diagnosis present

## 2023-02-23 DIAGNOSIS — E119 Type 2 diabetes mellitus without complications: Secondary | ICD-10-CM

## 2023-02-23 LAB — ECHOCARDIOGRAM COMPLETE
AR max vel: 1.58 cm2
AV Peak grad: 3.8 mmHg
Ao pk vel: 0.98 m/s
Area-P 1/2: 6.71 cm2
Calc EF: 15.8 %
Est EF: <20
Height: 61 in
S' Lateral: 4.9 cm
Single Plane A2C EF: 17.2 %
Single Plane A4C EF: 19.6 %
Weight: 2208 oz

## 2023-02-23 LAB — COMPREHENSIVE METABOLIC PANEL
ALT: 12 U/L (ref 0–44)
AST: 36 U/L (ref 15–41)
Albumin: 2.2 g/dL — ABNORMAL LOW (ref 3.5–5.0)
Alkaline Phosphatase: 142 U/L — ABNORMAL HIGH (ref 38–126)
Anion gap: 12 (ref 5–15)
BUN: 8 mg/dL (ref 6–20)
CO2: 27 mmol/L (ref 22–32)
Calcium: 8.5 mg/dL — ABNORMAL LOW (ref 8.9–10.3)
Chloride: 91 mmol/L — ABNORMAL LOW (ref 98–111)
Creatinine, Ser: 1.1 mg/dL — ABNORMAL HIGH (ref 0.44–1.00)
GFR, Estimated: >60 mL/min (ref >60)
Glucose, Bld: 472 mg/dL — ABNORMAL HIGH (ref 70–99)
Potassium: 2.7 mmol/L — CL (ref 3.5–5.1)
Sodium: 130 mmol/L — ABNORMAL LOW (ref 135–145)
Total Bilirubin: 0.7 mg/dL (ref 0.3–1.2)
Total Protein: 6.6 g/dL (ref 6.5–8.1)

## 2023-02-23 LAB — CBC
HCT: 37.3 % (ref 36.0–46.0)
Hemoglobin: 12.9 g/dL (ref 12.0–15.0)
MCH: 30.6 pg (ref 26.0–34.0)
MCHC: 34.6 g/dL (ref 30.0–36.0)
MCV: 88.6 fL (ref 80.0–100.0)
Platelets: 360 10*3/uL (ref 150–400)
RBC: 4.21 MIL/uL (ref 3.87–5.11)
RDW: 13.2 % (ref 11.5–15.5)
WBC: 9 10*3/uL (ref 4.0–10.5)
nRBC: 0.2 % (ref 0.0–0.2)

## 2023-02-23 LAB — RENAL FUNCTION PANEL
Albumin: 2.5 g/dL — ABNORMAL LOW (ref 3.5–5.0)
Anion gap: 14 (ref 5–15)
BUN: 8 mg/dL (ref 6–20)
CO2: 25 mmol/L (ref 22–32)
Calcium: 8.9 mg/dL (ref 8.9–10.3)
Chloride: 95 mmol/L — ABNORMAL LOW (ref 98–111)
Creatinine, Ser: 0.97 mg/dL (ref 0.44–1.00)
GFR, Estimated: >60 mL/min (ref >60)
Glucose, Bld: 372 mg/dL — ABNORMAL HIGH (ref 70–99)
Phosphorus: 3.6 mg/dL (ref 2.5–4.6)
Potassium: 3.8 mmol/L (ref 3.5–5.1)
Sodium: 134 mmol/L — ABNORMAL LOW (ref 135–145)

## 2023-02-23 LAB — TROPONIN I (HIGH SENSITIVITY)
Troponin I (High Sensitivity): 29 ng/L — ABNORMAL HIGH (ref <18)
Troponin I (High Sensitivity): 32 ng/L — ABNORMAL HIGH (ref <18)

## 2023-02-23 LAB — GLUCOSE, CAPILLARY: Glucose-Capillary: 270 mg/dL — ABNORMAL HIGH (ref 70–99)

## 2023-02-23 LAB — HCG, SERUM, QUALITATIVE: Preg, Serum: NEGATIVE

## 2023-02-23 LAB — MAGNESIUM
Magnesium: 1.4 mg/dL — ABNORMAL LOW (ref 1.7–2.4)
Magnesium: 1.3 mg/dL — ABNORMAL LOW (ref 1.7–2.4)

## 2023-02-23 LAB — HIV ANTIBODY (ROUTINE TESTING W REFLEX): HIV Screen 4th Generation wRfx: NONREACTIVE

## 2023-02-23 LAB — CBG MONITORING, ED
Glucose-Capillary: 440 mg/dL — ABNORMAL HIGH (ref 70–99)
Glucose-Capillary: 214 mg/dL — ABNORMAL HIGH (ref 70–99)
Glucose-Capillary: 197 mg/dL — ABNORMAL HIGH (ref 70–99)

## 2023-02-23 LAB — BRAIN NATRIURETIC PEPTIDE: B Natriuretic Peptide: 1432.8 pg/mL — ABNORMAL HIGH (ref 0.0–100.0)

## 2023-02-23 MED ORDER — INSULIN ASPART 100 UNIT/ML IJ SOLN
0.0000 [IU] | Freq: Three times a day (TID) | INTRAMUSCULAR | Status: DC
  Administered 2023-02-23: 6 [IU] via SUBCUTANEOUS

## 2023-02-23 MED ORDER — SODIUM CHLORIDE 0.9% FLUSH
3.0000 mL | Freq: Two times a day (BID) | INTRAVENOUS | Status: DC
  Administered 2023-02-23 – 2023-02-26 (×6): 3 mL via INTRAVENOUS

## 2023-02-23 MED ORDER — SACUBITRIL-VALSARTAN 24-26 MG PO TABS
1.0000 | ORAL_TABLET | Freq: Two times a day (BID) | ORAL | Status: DC
  Administered 2023-02-23: 1 via ORAL
  Filled 2023-02-23: qty 1

## 2023-02-23 MED ORDER — POTASSIUM CHLORIDE 20 MEQ PO PACK
80.0000 meq | PACK | Freq: Once | ORAL | Status: AC
  Administered 2023-02-23: 80 meq via ORAL
  Filled 2023-02-23: qty 4

## 2023-02-23 MED ORDER — CARVEDILOL 3.125 MG PO TABS
3.1250 mg | ORAL_TABLET | Freq: Two times a day (BID) | ORAL | Status: DC
  Administered 2023-02-23: 3.125 mg via ORAL
  Filled 2023-02-23: qty 1

## 2023-02-23 MED ORDER — FUROSEMIDE 10 MG/ML IJ SOLN
60.0000 mg | Freq: Two times a day (BID) | INTRAMUSCULAR | Status: DC
  Administered 2023-02-23 – 2023-02-24 (×3): 60 mg via INTRAVENOUS
  Filled 2023-02-23 (×3): qty 6

## 2023-02-23 MED ORDER — SODIUM CHLORIDE 0.9 % IV SOLN: 250.0000 mL | INTRAVENOUS | Status: DC | PRN

## 2023-02-23 MED ORDER — SPIRONOLACTONE 12.5 MG HALF TABLET
12.5000 mg | ORAL_TABLET | Freq: Every day | ORAL | Status: DC
  Administered 2023-02-23: 12.5 mg via ORAL
  Filled 2023-02-23: qty 1

## 2023-02-23 MED ORDER — ENOXAPARIN SODIUM 40 MG/0.4ML IJ SOSY
40.0000 mg | PREFILLED_SYRINGE | INTRAMUSCULAR | Status: DC
  Administered 2023-02-23 – 2023-02-25 (×3): 40 mg via SUBCUTANEOUS
  Filled 2023-02-23 (×3): qty 0.4

## 2023-02-23 MED ORDER — GABAPENTIN 100 MG PO CAPS
100.0000 mg | ORAL_CAPSULE | Freq: Three times a day (TID) | ORAL | Status: DC
  Administered 2023-02-23 – 2023-02-26 (×10): 100 mg via ORAL
  Filled 2023-02-23 (×10): qty 1

## 2023-02-23 MED ORDER — EMPAGLIFLOZIN 10 MG PO TABS
10.0000 mg | ORAL_TABLET | Freq: Every day | ORAL | Status: DC
  Administered 2023-02-23 – 2023-02-25 (×3): 10 mg via ORAL
  Filled 2023-02-23 (×3): qty 1

## 2023-02-23 MED ORDER — INSULIN ASPART 100 UNIT/ML IJ SOLN
0.0000 [IU] | Freq: Three times a day (TID) | INTRAMUSCULAR | Status: DC
  Administered 2023-02-23: 5 [IU] via SUBCUTANEOUS
  Administered 2023-02-23: 3 [IU] via SUBCUTANEOUS
  Administered 2023-02-24: 11 [IU] via SUBCUTANEOUS
  Administered 2023-02-24: 2 [IU] via SUBCUTANEOUS
  Administered 2023-02-24: 5 [IU] via SUBCUTANEOUS
  Administered 2023-02-25: 3 [IU] via SUBCUTANEOUS
  Administered 2023-02-25: 2 [IU] via SUBCUTANEOUS
  Administered 2023-02-25: 15 [IU] via SUBCUTANEOUS
  Administered 2023-02-26: 11 [IU] via SUBCUTANEOUS
  Administered 2023-02-26: 3 [IU] via SUBCUTANEOUS

## 2023-02-23 MED ORDER — INSULIN GLARGINE-YFGN 100 UNIT/ML ~~LOC~~ SOLN
40.0000 [IU] | Freq: Every day | SUBCUTANEOUS | Status: DC
  Administered 2023-02-23 – 2023-02-25 (×3): 40 [IU] via SUBCUTANEOUS
  Filled 2023-02-23 (×4): qty 0.4

## 2023-02-23 MED ORDER — ATORVASTATIN CALCIUM 40 MG PO TABS
40.0000 mg | ORAL_TABLET | Freq: Every day | ORAL | Status: DC
  Administered 2023-02-23 – 2023-02-25 (×3): 40 mg via ORAL
  Filled 2023-02-23 (×3): qty 1

## 2023-02-23 MED ORDER — SODIUM CHLORIDE 0.9% FLUSH: 3.0000 mL | INTRAVENOUS | Status: DC | PRN

## 2023-02-23 MED ORDER — ONDANSETRON HCL 4 MG PO TABS: 4.0000 mg | ORAL_TABLET | Freq: Four times a day (QID) | ORAL | Status: DC | PRN

## 2023-02-23 MED ORDER — SACUBITRIL-VALSARTAN 49-51 MG PO TABS
1.0000 | ORAL_TABLET | Freq: Two times a day (BID) | ORAL | Status: DC
  Administered 2023-02-23: 1 via ORAL
  Filled 2023-02-23: qty 1

## 2023-02-23 MED ORDER — POTASSIUM CHLORIDE CRYS ER 20 MEQ PO TBCR
40.0000 meq | EXTENDED_RELEASE_TABLET | Freq: Once | ORAL | Status: AC
  Administered 2023-02-23: 40 meq via ORAL
  Filled 2023-02-23: qty 2

## 2023-02-23 MED ORDER — ACETAMINOPHEN 650 MG RE SUPP: 650.0000 mg | Freq: Four times a day (QID) | RECTAL | Status: DC | PRN

## 2023-02-23 MED ORDER — HYDRALAZINE HCL 10 MG PO TABS
10.0000 mg | ORAL_TABLET | Freq: Once | ORAL | Status: AC
  Administered 2023-02-23: 10 mg via ORAL
  Filled 2023-02-23: qty 1

## 2023-02-23 MED ORDER — FUROSEMIDE 10 MG/ML IJ SOLN
40.0000 mg | INTRAMUSCULAR | Status: AC
  Administered 2023-02-23: 40 mg via INTRAVENOUS
  Filled 2023-02-23: qty 4

## 2023-02-23 MED ORDER — HYDRALAZINE HCL 20 MG/ML IJ SOLN: 10.0000 mg | Freq: Three times a day (TID) | INTRAMUSCULAR | Status: DC | PRN

## 2023-02-23 MED ORDER — DIGOXIN 125 MCG PO TABS
0.1250 mg | ORAL_TABLET | Freq: Every day | ORAL | Status: DC
  Administered 2023-02-23 – 2023-02-26 (×4): 0.125 mg via ORAL
  Filled 2023-02-23 (×4): qty 1

## 2023-02-23 MED ORDER — ACETAMINOPHEN 325 MG PO TABS
650.0000 mg | ORAL_TABLET | Freq: Four times a day (QID) | ORAL | Status: DC | PRN
  Administered 2023-02-23 – 2023-02-26 (×4): 650 mg via ORAL
  Filled 2023-02-23 (×4): qty 2

## 2023-02-23 MED ORDER — ONDANSETRON HCL 4 MG/2ML IJ SOLN: 4.0000 mg | Freq: Four times a day (QID) | INTRAMUSCULAR | Status: DC | PRN

## 2023-02-23 MED ORDER — INSULIN ASPART 100 UNIT/ML IJ SOLN
0.0000 [IU] | Freq: Every day | INTRAMUSCULAR | Status: DC
  Administered 2023-02-24: 2 [IU] via SUBCUTANEOUS

## 2023-02-23 MED ORDER — POTASSIUM CHLORIDE 10 MEQ/100ML IV SOLN
10.0000 meq | Freq: Once | INTRAVENOUS | Status: AC
  Administered 2023-02-23: 10 meq via INTRAVENOUS
  Filled 2023-02-23: qty 100

## 2023-02-23 NOTE — ED Notes (Signed)
The secretary tried to page the attending several times to call, but I didn't get a call back. I messaged attending through messaging and notified Dr Veverly Fells of pt's glucose [phone] at 0916 this morning and obtained new orders.

## 2023-02-23 NOTE — ED Triage Notes (Signed)
Pt complaining of bilateral lower leg swelling for the last 2 weeks, has not been on lasix because she doesn't have any more. Causing some shortness of breath.

## 2023-02-23 NOTE — H&P (Signed)
History and Physical    Cheryl Mercer ZOX:[id number] DOB: 07/17/1986 DOA: 02/23/2023  PCP: [facility], MD   Patient coming from: Home   Chief Complaint:  Chief Complaint  Patient presents with   Shortness of Breath    HPI:  37 year old female medical history of chronic systolic heart failure with reduced EF less than 20%, insulin-dependent diabetes mellitus type 2, hypertension and diabetic neuropathy vented to emergency department with complaining of progressively worsening shortness of breath and bilateral lower extremity edema.  Patient is reporting that she ran out off her all blood pressure regimen and torsemide almost for 2 months as she did not have any insurance and unable to get access to primary care doctor as well.  She only has insulin at home.  Patient is complaining of orthopnea, PND, dyspnea on exertion and chest pressure while lying down.  Denies any palpitation, headache, nausea and vomiting.  ED Course:  Initial presentation to ED heart rate 115, respiratory 22 and blood pressure 170/121 O2 sat 92% room air. BNP elevated 1432 (baseline BNP around 867) High sens troponin 29 elevated.  EKG showed sinus tachycardia, possible left atrial enlargement and left anterior fascicular block, no ST and T wave abnormality. CMP corrected sodium 136, low chloride 91, elevated creatinine 1.1(baseline creatinine 0.73), elevated blood glucose 474, bicarb 27, normal anion gap 12, slightly low calcium 8.5, low albumin 2.2 and elevated ALP 142. CBC unremarkable. Chest x-ray showed mild cardiomegaly and interstitial opacity of the both lung possibly pulmonary edema. In the ED patient got less 40 mg IV once and KCl total 50 meq.  Hospitalist team has been consulted admitting patient for management of CHF exacerbation.  Review of Systems:  Review of Systems  Constitutional:  Negative for chills, fever, malaise/fatigue and weight loss.  Respiratory:  Positive for cough.    Cardiovascular:  Positive for orthopnea, leg swelling and PND. Negative for chest pain and palpitations.       Chest pressure  Gastrointestinal:  Negative for abdominal pain, heartburn, nausea and vomiting.  Musculoskeletal:  Negative for myalgias and neck pain.  Neurological:  Negative for dizziness and headaches.  Psychiatric/Behavioral:  The patient is not nervous/anxious.        Labile mood-crying    Past Medical History:  Diagnosis Date   Chronic back pain    Chronic combined systolic and diastolic congestive heart failure (HCC) 09/07/2019   Diabetes mellitus without complication (HCC)    History of COVID-19 11/07/2019   Migraines    Peripheral neuropathy    Pregnancy induced hypertension    Pulmonary embolus (HCC) 12/16/2019   Pulmonary infarct (HCC) 12/17/2019    Past Surgical History:  Procedure Laterality Date   CERVICAL BIOPSY  W/ LOOP ELECTRODE EXCISION  2009   Laparoscopic tubal sterilization with Falope rings.  2009   RIGHT/LEFT HEART CATH AND CORONARY ANGIOGRAPHY N/A 03/25/2019   Procedure: RIGHT/LEFT HEART CATH AND CORONARY ANGIOGRAPHY;  Surgeon: Dolores Patty, MD;  Location: [facility];  Service: Cardiovascular;  Laterality: N/A;   TUBAL LIGATION       reports that she has never smoked. She has never used smokeless tobacco. She reports that she does not drink alcohol and does not use drugs.  Allergies  Allergen Reactions   Ibuprofen Anaphylaxis    Family History  Problem Relation Age of Onset   Hypertension Mother    Anesthesia problems Neg Hx     Prior to Admission medications   Medication Sig Start Date  End Date Taking? Authorizing Provider  atorvastatin (LIPITOR) 40 MG tablet Take 1 tablet (40 mg total) by mouth daily at 6 PM. 06/14/22   Alford Highland, MD  blood glucose meter kit and supplies KIT Dispense based on patient and insurance preference. Check your sugars prior to meals and at bedtime 06/15/22   Alford Highland, MD   carvedilol (COREG) 3.125 MG tablet Take 1 tablet (3.125 mg total) by mouth 2 (two) times daily with a meal. 06/15/22   Alford Highland, MD  empagliflozin (JARDIANCE) 10 MG TABS tablet Take 1 tablet (10 mg total) by mouth daily before breakfast. 06/14/22   Alford Highland, MD  gabapentin (NEURONTIN) 100 MG capsule Take 1 capsule (100 mg total) by mouth 3 (three) times daily. 06/14/22   Alford Highland, MD  insulin glargine (LANTUS) 100 UNIT/ML Solostar Pen Inject 40 Units into the skin daily. 06/14/22   Alford Highland, MD  insulin lispro (HUMALOG KWIKPEN) 100 UNIT/ML KwikPen Inject 2-15 Units into the skin 3 (three) times daily after meals. CBG 121 - 150: 2 units, 151 - 200: 3 units, 201 - 250: 5 units, 251 - 300: 8 units, 301 - 350: 11 units, 351 - 400: 15u 06/14/22   Wieting, Richard, MD  Insulin Pen Needle 33G X 5 MM MISC 1 Dose by Does not apply route 3 (three) times daily before meals. 06/14/22   Alford Highland, MD  potassium chloride SA (KLOR-CON M) 10 MEQ tablet Take 1 tablet (10 mEq total) by mouth daily. 06/16/22   Alford Highland, MD  sacubitril-valsartan (ENTRESTO) 24-26 MG Take 1 tablet by mouth 2 (two) times daily. 06/14/22   Alford Highland, MD  spironolactone (ALDACTONE) 25 MG tablet Take 0.5 tablets (12.5 mg total) by mouth daily. 06/16/22   Alford Highland, MD  torsemide (DEMADEX) 20 MG tablet Take one tablet daily (if you gain 3lbs in a day or five pounds in a week can take an extra pill in the afternoon) 06/16/22   Alford Highland, MD     Physical Exam: Vitals:   02/23/23 0231 02/23/23 0233 02/23/23 0234 02/23/23 0623  BP: (!) 170/121     Pulse: (!) 115     Resp: (!) 22     Temp: 97.8 F (36.6 C)   97.8 F (36.6 C)  TempSrc:    Oral  SpO2: 92% 92%    Weight:   62.6 kg   Height:   5\' 1"  (1.549 m)     Physical Exam Constitutional:      General: She is not in acute distress.    Appearance: She is ill-appearing.  Cardiovascular:     Rate and Rhythm: Normal  rate and regular rhythm.     Heart sounds: Murmur heard.  Pulmonary:     Breath sounds: Examination of the right-middle field reveals rales. Examination of the left-middle field reveals rales. Examination of the right-lower field reveals rales. Examination of the left-lower field reveals rales. Rales present.     Comments: Rales bilateral lower lung field Chest:     Chest wall: No tenderness.  Abdominal:     General: Bowel sounds are normal.     Palpations: Abdomen is soft.  Musculoskeletal:     Right lower leg: Edema present.     Left lower leg: Edema present.     Comments: Bilateral lower extremity pitting edema 1+  Skin:    Capillary Refill: Capillary refill takes less than 2 seconds.  Neurological:     Mental Status: She is alert  and oriented to person, place, and time.  Psychiatric:        Mood and Affect: Mood is not anxious.     Comments: Depressed mood and crying      Labs on Admission: I have personally reviewed following labs and imaging studies  CBC: Recent Labs  Lab 02/23/23 0243  WBC 9.0  HGB 12.9  HCT 37.3  MCV 88.6  PLT 360   Basic Metabolic Panel: Recent Labs  Lab 02/23/23 0239 02/23/23 0519  NA 130*  --   K 2.7*  --   CL 91*  --   CO2 27  --   GLUCOSE 472*  --   BUN 8  --   CREATININE 1.10*  --   CALCIUM 8.5*  --   MG  --  1.4*   GFR: Estimated Creatinine Clearance: 60.5 mL/min (A) (by C-G formula based on SCr of 1.1 mg/dL (H)). Liver Function Tests: Recent Labs  Lab 02/23/23 0239  AST 36  ALT 12  ALKPHOS 142*  BILITOT 0.7  PROT 6.6  ALBUMIN 2.2*   No results for input(s): "LIPASE", "AMYLASE" in the last 168 hours. No results for input(s): "AMMONIA" in the last 168 hours. Coagulation Profile: No results for input(s): "INR", "PROTIME" in the last 168 hours. Cardiac Enzymes: Recent Labs  Lab 02/23/23 0239 02/23/23 0519  TROPONINIHS 29* 32*   BNP (last 3 results) Recent Labs    06/12/22 0631 02/12/23 2350 02/23/23 0239   BNP 867.5* 1,242.5* 1,432.8*   HbA1C: No results for input(s): "HGBA1C" in the last 72 hours. CBG: No results for input(s): "GLUCAP" in the last 168 hours. Lipid Profile: No results for input(s): "CHOL", "HDL", "LDLCALC", "TRIG", "CHOLHDL", "LDLDIRECT" in the last 72 hours. Thyroid Function Tests: No results for input(s): "TSH", "T4TOTAL", "FREET4", "T3FREE", "THYROIDAB" in the last 72 hours. Anemia Panel: No results for input(s): "VITAMINB12", "FOLATE", "FERRITIN", "TIBC", "IRON", "RETICCTPCT" in the last 72 hours. Urine analysis:    Component Value Date/Time   COLORURINE YELLOW 01/06/2022 1000   APPEARANCEUR CLEAR 01/06/2022 1000   LABSPEC 1.020 01/06/2022 1000   PHURINE 6.0 01/06/2022 1000   GLUCOSEU >=500 (A) 01/06/2022 1000   HGBUR TRACE (A) 01/06/2022 1000   BILIRUBINUR NEGATIVE 01/06/2022 1000   KETONESUR NEGATIVE 01/06/2022 1000   PROTEINUR 100 (A) 01/06/2022 1000   UROBILINOGEN 0.2 02/04/2019 1720   NITRITE NEGATIVE 01/06/2022 1000   LEUKOCYTESUR NEGATIVE 01/06/2022 1000    Radiological Exams on Admission: I have personally reviewed images DG Chest 2 View  Result Date: 02/23/2023 CLINICAL DATA:  Shortness of breath EXAM: CHEST - 2 VIEW COMPARISON:  Chest x-ray 06/12/2022.  CT of the chest 12/31/2021 FINDINGS: The heart is mildly enlarged. There are interstitial opacities in both lower lungs. There is no pleural effusion or pneumothorax. No acute fractures are seen. IMPRESSION: 1. Mild cardiomegaly. 2. Interstitial opacities in both lower lungs, possibly pulmonary edema. Electronically Signed   By: Darliss Cheney M.D.   On: 02/23/2023 02:58    EKG: My personal interpretation of EKG shows: Sinus tachycardia with evidence of left atrial enlargement.  No ST anterior abnormality.    Assessment/Plan: Principal Problem:   CHF exacerbation (HCC) Active Problems:   Acute exacerbation of CHF (congestive heart failure) (HCC)   Hypokalemia   History of chronic CHF EF less  than 20%   Elevated troponin   Diabetic neuropathy (HCC)   AKI (acute kidney injury) (HCC)   Essential hypertension   Diabetes mellitus type 2, insulin dependent (  HCC)    Assessment and Plan: CHF exacerbation Systolic heart failure EF less than 20% Hypertrophic cardiomyopathy -Per chart review previous echocardiogram from 05/2022 showed low ejection fraction less than 20%, dilated left ventricle, dilated right ventricle, Dilated left atrium, mitral regurgitation, mild tricuspid regurgitation, calcified aortic valve and trivial pulmonary regurgitation. -Patient reported she ran out of blood pressure regimen and lungs with torsemide for last 2 months.  She has orthopnea, dyspnea, PND and bilateral worsening lower extremity swelling for over the course of 2 weeks.  Unable to lie flat.  Also having some chest pressure.  Denies any substernal chest pain, palpitation, numbness and headache. -Blood on presentation 171/121.  And heart rate 113. -Elevated BNP 1432 -Chest x-ray showed evidence of pulmonary edema -In the ED got Lasix 40 mg IV once. - Continue Lasix 80 mg IV twice daily. - Resuming Coreg 3.125 mg twice daily, Entresto twice daily, Aldactone 12.5 mg daily and Jardiance 10 mg. -Consulted cardiology and heart failure team.  Patient will benefit from LifeVest versus AICD placement? - Please reach out to cardiology in the a.m. -Strict I's/O - Obtaining echocardiogram. - Continue fluid restriction 1.8 L/day and salt restriction 2 g/day -Continue telemonitoring. -On discharge patient need at least 1 month of supply of medication, need to follow-up with case management for insurance, need to follow-up with cardiology and heart failure clinic outpatient.  Hypokalemia -Potassium 2.7 on presentation. -In the ED got KCl IV and oral KCl once -Giving another KCl oral 80 mEq -Check potassium 3 times daily and replete as needed  Elevated troponin Elevate troponin from demand  ischemia in the setting of CHF exacerbation. -Patient is complaining of chest pressure mostly related to CHF exacerbation. - Elevated troponin 29.  EKG shows sinus rhythm with evidence of left atrial enlargement. No concern for ACS - Continue to trend troponin. -Continue telemonitoring   Essential hypertension Uncontrolled high blood pressure-medication noncompliance -History of hypertension and ran out of blood pressure regimen 2 months ago. - Resumed home Coreg, Entresto and spironolactone. At home torsemide 20 mg daily.  Currently on Lasix 60 mg IV twice daily.  Diabetic neuropathy -Resumed home gabapentin 100 mg 3 times daily  AKI - Creatinine on presentation 1.1.  Baseline grade Denine within normal range and normal renal function - Prerenal AKI in the setting of CHF exacerbation and elevated blood pressure - Hoping that with diuretics and volume offload renal function will improve. -Monitor urine output and renal function. - Avoid nephrotoxic agent  DM type II -Patient reported compliant with insulin regimen however blood glucose 472 on initial presentation.  Normal bicarb and normal anion gap. -Resumed previous home dose of glargine 40 units at bedtime, and continue sliding scale SSI with meals and POC blood glucose checks 3 times daily with meals and with time. -Checking A1c   DVT prophylaxis: Lovenox Code Status: Full Code Diet: Carb consistent and heart healthy diet, fluid restriction 1.8 L/day and salt restriction 2 g/day Family Communication: Patient's children at the bedside Disposition Plan: Plan to discharge to home in 2 to 3 days Consults: Cardiology and heart failure team.  Please call them in the morning. Admission status: Inpatient, Step Down Unit  Severity of Illness: The appropriate patient status for this patient is INPATIENT. Inpatient status is judged to be reasonable and necessary in order to provide the required intensity of service to ensure the  patient's safety. The patient's presenting symptoms, physical exam findings, and initial radiographic and laboratory data in the  context of their chronic comorbidities is felt to place them at high risk for further clinical deterioration. Furthermore, it is not anticipated that the patient will be medically stable for discharge from the hospital within 2 midnights of admission.   * I certify that at the point of admission it is my clinical judgment that the patient will require inpatient hospital care spanning beyond 2 midnights from the point of admission due to high intensity of service, high risk for further deterioration and high frequency of surveillance required.Marland Kitchen    Tereasa Coop MD [facility]  How to contact the [facility] Attending or Consulting provider 7A - 7P or covering provider during after hours 7P -7A, for this patient?   Check the care team in [facility] and look for a) attending/consulting [facility] provider listed and b) the [facility] team listed Log into [email or web address] and use [place]'s universal password to access. If you do not have the password, please contact the hospital operator. Locate the [facility] provider you are looking for under [facility] and page to a number that you can be directly reached. If you still have difficulty reaching the provider, please page the [facility] (Director on Call) for the Hospitalists listed on amion for assistance.  02/23/2023, 6:25 AM

## 2023-02-23 NOTE — Progress Notes (Signed)
Echocardiogram 2D Echocardiogram has been performed.  Cheryl Mercer 02/23/2023, 4:26 PM

## 2023-02-23 NOTE — ED Notes (Signed)
Misty Stanley PA aware of Potassium 2.7

## 2023-02-23 NOTE — Consult Note (Signed)
Advanced Heart Failure Team Consult Note   Primary Physician: [facility], MD PCP-Cardiologist:  None AHF cardiologist: Bensimhon, MD  Reason for Consultation: Acute on chronic biventricular systolic heart failure  HPI:    Cheryl Mercer is seen today for evaluation of acute on chronic biventricular systolic heart failure at the request of Dr. Dartha Lodge.   Cheryl Mercer is a 37 y.o.  hispanic female with chronic systolic heart failure, h/o PE, migraines, hypertension and poorly controlled type 2 diabetes mellitus. She has previously no showed to multiple AHF clinic visits. Last seen in AHF clinic 5/23. Has been admitted several time with CHF exacerbations 2/2 med noncompliance.   Today she presented to the ED with worsening SOB and BLE edema over the last 2 months, worsening over the last 2 weeks. She has been out of her BP meds and diuretics for about 2 months but apparently still had some Torsemide that she found and was taking those every now and then. Reports compliance with her meds when she has them but has been having issues filling her meds at the pharmacy per patient report. She reports "giving up" and not going back for meds as it was too hard. Recently she has been unable to lay flat and unable to walk short distances as she quickly gets sob. Has been depressed and stressed over the last month. Has been going out and been drinking from Thursday- Sunday about 5 24 oz beers/day. Smokes marijuana every now and then. Appetite has been good, eats out occasionally. Does not work, her mom and children live with her.   In the ED patient tachycardic, SBP 170s, BNP 1432, HsTrop 29>32, K 2.7, SCr 0.97, Mg 1.3. CXR with pulmonary edema. Pregnancy test (-). K has been repleted and she was started on IV lasix.   Resting comfortably in bed, daughter at bedside. Reports breathing has improved already. +orthopnea.   Cardiac studies reviewed:  Echo today pending Echo 10/23 EF <20%,  RV severely reduced. Mild concentric LVH, LV with GHK, smoke seen in LA, RA severely dilated, mild MR Echo 5/23 EF 25-30%, GIIIDD, RV mod reduced, LA/RA mildly dilated Echo 10/22 EF 30-35%, normal RV Echo 21' EF at 10-15% and moderately reduced RV RHC/LHC 03/25/19 showed no CAD. Well compensated hemodynamics with low filling pressures.  03/25/19 cMRI w/ EF at 15% and no significant LGE   Home Medications Prior to Admission medications   Medication Sig Start Date End Date Taking? Authorizing Provider  insulin glargine (LANTUS) 100 UNIT/ML Solostar Pen Inject 40 Units into the skin daily. 06/14/22  Yes Wieting, Richard, MD  insulin lispro (HUMALOG KWIKPEN) 100 UNIT/ML KwikPen Inject 2-15 Units into the skin 3 (three) times daily after meals. CBG 121 - 150: 2 units, 151 - 200: 3 units, 201 - 250: 5 units, 251 - 300: 8 units, 301 - 350: 11 units, 351 - 400: 15u 06/14/22  Yes Wieting, Richard, MD  torsemide (DEMADEX) 20 MG tablet Take one tablet daily (if you gain 3lbs in a day or five pounds in a week can take an extra pill in the afternoon) 06/16/22  Yes Wieting, Richard, MD  atorvastatin (LIPITOR) 40 MG tablet Take 1 tablet (40 mg total) by mouth daily at 6 PM. Patient not taking: Reported on 02/23/2023 06/14/22   Alford Highland, MD  blood glucose meter kit and supplies KIT Dispense based on patient and insurance preference. Check your sugars prior to meals and at bedtime 06/15/22   Alford Highland, MD  carvedilol (COREG) 3.125 MG tablet Take 1 tablet (3.125 mg total) by mouth 2 (two) times daily with a meal. Patient not taking: Reported on 02/23/2023 06/15/22   Alford Highland, MD  empagliflozin (JARDIANCE) 10 MG TABS tablet Take 1 tablet (10 mg total) by mouth daily before breakfast. Patient not taking: Reported on 02/23/2023 06/14/22   Alford Highland, MD  gabapentin (NEURONTIN) 100 MG capsule Take 1 capsule (100 mg total) by mouth 3 (three) times daily. Patient not taking: Reported on 02/23/2023  06/14/22   Alford Highland, MD  Insulin Pen Needle 33G X 5 MM MISC 1 Dose by Does not apply route 3 (three) times daily before meals. 06/14/22   Alford Highland, MD  potassium chloride SA (KLOR-CON M) 10 MEQ tablet Take 1 tablet (10 mEq total) by mouth daily. Patient not taking: Reported on 02/23/2023 06/16/22   Alford Highland, MD  sacubitril-valsartan (ENTRESTO) 24-26 MG Take 1 tablet by mouth 2 (two) times daily. Patient not taking: Reported on 02/23/2023 06/14/22   Alford Highland, MD  spironolactone (ALDACTONE) 25 MG tablet Take 0.5 tablets (12.5 mg total) by mouth daily. Patient not taking: Reported on 02/23/2023 06/16/22   Alford Highland, MD   Past Medical History: Past Medical History:  Diagnosis Date   Chronic back pain    Chronic combined systolic and diastolic congestive heart failure (HCC) 09/07/2019   Diabetes mellitus without complication (HCC)    History of COVID-19 11/07/2019   Migraines    Peripheral neuropathy    Pregnancy induced hypertension    Pulmonary embolus (HCC) 12/16/2019   Pulmonary infarct (HCC) 12/17/2019   Past Surgical History: Past Surgical History:  Procedure Laterality Date   CERVICAL BIOPSY  W/ LOOP ELECTRODE EXCISION  2009   Laparoscopic tubal sterilization with Falope rings.  2009   RIGHT/LEFT HEART CATH AND CORONARY ANGIOGRAPHY N/A 03/25/2019   Procedure: RIGHT/LEFT HEART CATH AND CORONARY ANGIOGRAPHY;  Surgeon: Dolores Patty, MD;  Location: [facility];  Service: Cardiovascular;  Laterality: N/A;   TUBAL LIGATION      Family History: Family History  Problem Relation Age of Onset   Hypertension Mother    Anesthesia problems Neg Hx     Social History: Social History   Socioeconomic History   Marital status: Married    Spouse name: Not on file   Number of children: 4   Years of education: Not on file   Highest education level: Not on file  Occupational History   Not on file  Tobacco Use   Smoking status: Never    Smokeless tobacco: Never  Vaping Use   Vaping Use: Never used  Substance and Sexual Activity   Alcohol use: No    Alcohol/week: 6.0 standard drinks of alcohol    Types: 6 Shots of liquor per week   Drug use: No    Types: Marijuana   Sexual activity: Not Currently    Birth control/protection: Surgical    Comment: band placed around tubes in 2009 surgically by Dr. Emelda Fear  Other Topics Concern   Not on file  Social History Narrative   Not on file   Social Determinants of Health   Financial Resource Strain: Medium Risk (04/07/2019)   Overall Financial Resource Strain (CARDIA)    Difficulty of Paying Living Expenses: Somewhat hard  Food Insecurity: No Food Insecurity (06/14/2022)   Hunger Vital Sign    Worried About Running Out of Food in the Last Year: Never true    Ran Out of Food  in the Last Year: Never true  Transportation Needs: No Transportation Needs (06/14/2022)   PRAPARE - Administrator, Civil Service (Medical): No    Lack of Transportation (Non-Medical): No  Physical Activity: Not on file  Stress: Not on file  Social Connections: Not on file    Allergies:  Allergies  Allergen Reactions   Ibuprofen Anaphylaxis    Objective:    Vital Signs:   Temp:  [97.4 F (36.3 C)-98 F (36.7 C)] 98 F (36.7 C) (07/08 1417) Pulse Rate:  [101-116] 102 (07/08 1500) Resp:  [12-22] 13 (07/08 1500) BP: (137-170)/(97-121) 148/112 (07/08 1500) SpO2:  [92 %-100 %] 100 % (07/08 1500) Weight:  [62.6 kg] 62.6 kg (07/08 0234) Last BM Date : 02/23/23  Weight change: Filed Weights   02/23/23 0234  Weight: 62.6 kg    Intake/Output:  No intake or output data in the 24 hours ending 02/23/23 1553    Physical Exam  General:  well appearing.  No respiratory difficulty HEENT: normal Neck: supple. JVD ~12 cm. Carotids 2+ bilat; no bruits. No lymphadenopathy or thyromegaly appreciated. Cor: PMI nondisplaced. Tachy/reg rate & rhythm. No rubs, gallops or murmurs. Lungs:  clear Abdomen: soft, nontender, nondistended. No hepatosplenomegaly. No bruits or masses. Good bowel sounds. Extremities: no cyanosis, clubbing, rash, +2 BLE edema  Neuro: alert & oriented x 3, cranial nerves grossly intact. moves all 4 extremities w/o difficulty. Affect pleasant.   Telemetry   ST low 100s (Personally reviewed)    EKG    ST 112 bpm, left anterior fascicular block  Labs   Basic Metabolic Panel: Recent Labs  Lab 02/23/23 0239 02/23/23 0519 02/23/23 1035  NA 130*  --  134*  K 2.7*  --  3.8  CL 91*  --  95*  CO2 27  --  25  GLUCOSE 472*  --  372*  BUN 8  --  8  CREATININE 1.10*  --  0.97  CALCIUM 8.5*  --  8.9  MG  --  1.4* 1.3*  PHOS  --   --  3.6    Liver Function Tests: Recent Labs  Lab 02/23/23 0239 02/23/23 1035  AST 36  --   ALT 12  --   ALKPHOS 142*  --   BILITOT 0.7  --   PROT 6.6  --   ALBUMIN 2.2* 2.5*   No results for input(s): "LIPASE", "AMYLASE" in the last 168 hours. No results for input(s): "AMMONIA" in the last 168 hours.  CBC: Recent Labs  Lab 02/23/23 0243  WBC 9.0  HGB 12.9  HCT 37.3  MCV 88.6  PLT 360   Cardiac Enzymes: No results for input(s): "CKTOTAL", "CKMB", "CKMBINDEX", "TROPONINI" in the last 168 hours.  BNP: BNP (last 3 results) Recent Labs    06/12/22 0631 02/12/23 2350 02/23/23 0239  BNP 867.5* 1,242.5* 1,432.8*   ProBNP (last 3 results) No results for input(s): "PROBNP" in the last 8760 hours.  CBG: Recent Labs  Lab 02/23/23 0909 02/23/23 1306  GLUCAP 440* 214*   Coagulation Studies: No results for input(s): "LABPROT", "INR" in the last 72 hours.  Imaging   DG Chest 2 View  Result Date: 02/23/2023 CLINICAL DATA:  Shortness of breath EXAM: CHEST - 2 VIEW COMPARISON:  Chest x-ray 06/12/2022.  CT of the chest 12/31/2021 FINDINGS: The heart is mildly enlarged. There are interstitial opacities in both lower lungs. There is no pleural effusion or pneumothorax. No acute fractures are seen.  IMPRESSION: 1.  Mild cardiomegaly. 2. Interstitial opacities in both lower lungs, possibly pulmonary edema. Electronically Signed   By: Darliss Cheney M.D.   On: 02/23/2023 02:58    Medications:     Current Medications:  atorvastatin  40 mg Oral q1800   carvedilol  3.125 mg Oral BID WC   empagliflozin  10 mg Oral QAC breakfast   enoxaparin (LOVENOX) injection  40 mg Subcutaneous Q24H   furosemide  60 mg Intravenous BID   gabapentin  100 mg Oral TID   insulin aspart  0-15 Units Subcutaneous TID WC   insulin aspart  0-5 Units Subcutaneous QHS   insulin glargine-yfgn  40 Units Subcutaneous Daily   sacubitril-valsartan  1 tablet Oral BID   sodium chloride flush  3 mL Intravenous Q12H   spironolactone  12.5 mg Oral Daily    Infusions:  sodium chloride      Patient Profile   Cheryl Mercer is a 37 y.o. hispanic female with chronic systolic heart failure, h/o PE, migraines, hypertension and poorly controlled type 2 diabetes mellitus. AHF team to see for A/C biventricular HFrEF.  Assessment/Plan  Acute on chronic biventricular systolic heart failure - echo 09/81 EF <20%, RV severely reduced. Mild concentric LVH, LV with GHK, smoke seen in LA, RA severely dilated, mild MR - NYHA IV on admission, suspect CM 2/2 uncontrolled hypertension, ETOH, medication noncompliance and DM - Volume overloaded on exam. Good UOP per patient report, no UOP documented. Continue lasix 60 mg IV BID - hold BB with acute exacerbation - Continue Jardiance 10 mg daily - Continue spironolactone 12.5 mg daily - Increase Entresto 24/26>49/51 - Start digoxin 0.125 daily - echo done today, read pending - strict I&O, daily weights - asking about ICD, timing TBD.  Elevated troponin - no CAD on cath in 2020 - HsTrop 29>32 - LDL 75 11/21, update lipid panel - Continue statin - denies CP Hypertension - SBP 170s on admission - SBP now in 140s - one time hydralazine PO now  - increasing Entresto as  above DM II - reports compliance with insulin at home - Hgb A1c 11.1 10/2, update - SSI while admitted per primary H/o PE w/ pulmonary infarct - treated with eliquis x6 months 4/21 Alcohol use - Recently had been drinking 5 24oz beer Thur-Sunday to cope with depression - cessation encouraged   Length of Stay: 0  Alen Bleacher, NP  02/23/2023, 3:53 PM  Advanced Heart Failure Team Pager [phone] (M-F; 7a - 5p)  Please contact [facility] for night-coverage after hours (4p -7a ) and weekends on [email or web address]

## 2023-02-23 NOTE — Progress Notes (Signed)
Progress note: -Admitted earlier today with acute on chronic systolic CHF with EF of less than 20%/hypertrophic cardiomyopathy. -Apparently, patient has been noncompliant with medication, including diuretics, for about 2 months.  As per H&P done earlier: "37 year old female medical history of chronic systolic heart failure with reduced EF less than 20%, insulin-dependent diabetes mellitus type 2, hypertension and diabetic neuropathy vented to emergency department with complaining of progressively worsening shortness of breath and bilateral lower extremity edema.  Patient is reporting that she ran out off her all blood pressure regimen and torsemide almost for 2 months as she did not have any insurance and unable to get access to primary care doctor as well.  She only has insulin at home.  Patient is complaining of orthopnea, PND, dyspnea on exertion and chest pressure while lying down.  Denies any palpitation, headache, nausea and vomiting.   ED Course:  Initial presentation to ED heart rate 115, respiratory 22 and blood pressure 170/121 O2 sat 92% room air. BNP elevated 1432 (baseline BNP around 867) High sens troponin 29 elevated.  EKG showed sinus tachycardia, possible left atrial enlargement and left anterior fascicular block, no ST and T wave abnormality. CMP corrected sodium 136, low chloride 91, elevated creatinine 1.1(baseline creatinine 0.73), elevated blood glucose 474, bicarb 27, normal anion gap 12, slightly low calcium 8.5, low albumin 2.2 and elevated ALP 142. CBC unremarkable. Chest x-ray showed mild cardiomegaly and interstitial opacity of the both lung possibly pulmonary edema. In the ED patient got less 40 mg IV once and KCl total 50 meq.   Hospitalist team has been consulted admitting patient for management of CHF exacerbation".   02/23/2023: Continue IV Lasix.  Patient is slowly improving.  Cardiology team has been consulted.  Further management depend on hospital course.

## 2023-02-23 NOTE — ED Provider Notes (Signed)
[place] EMERGENCY DEPARTMENT AT [facility] Provider Note   CSN: [id number] Arrival date & time: 02/23/23  0215     History  Chief Complaint  Patient presents with   Shortness of Breath    Cheryl Mercer is a 37 y.o. female.  The history is provided by the patient and medical records.  Shortness of Breath  37 year old female with history of CHF (EF <20% on last echo), dyslipidemia, cardiomyopathy, hypokalemia, presenting to the ED with LE edema.  Patient admits that she has been off lasix for about 2 months in total-- states her prescription ran out, there was an issue with insurance and she essentially just gave up.  She has also not really been watching her diet (she was modifying salt at first but no longer).  Swelling in legs has dramatically increased over the past 2 weeks.  She is having SOB as well, waxes and wanes in severity.  No cough, fever, chills. No chest pain reported.    Home Medications Prior to Admission medications   Medication Sig Start Date End Date Taking? Authorizing Provider  atorvastatin (LIPITOR) 40 MG tablet Take 1 tablet (40 mg total) by mouth daily at 6 PM. 06/14/22   Alford Highland, MD  blood glucose meter kit and supplies KIT Dispense based on patient and insurance preference. Check your sugars prior to meals and at bedtime 06/15/22   Alford Highland, MD  carvedilol (COREG) 3.125 MG tablet Take 1 tablet (3.125 mg total) by mouth 2 (two) times daily with a meal. 06/15/22   Alford Highland, MD  empagliflozin (JARDIANCE) 10 MG TABS tablet Take 1 tablet (10 mg total) by mouth daily before breakfast. 06/14/22   Alford Highland, MD  gabapentin (NEURONTIN) 100 MG capsule Take 1 capsule (100 mg total) by mouth 3 (three) times daily. 06/14/22   Alford Highland, MD  insulin glargine (LANTUS) 100 UNIT/ML Solostar Pen Inject 40 Units into the skin daily. 06/14/22   Alford Highland, MD  insulin lispro (HUMALOG KWIKPEN) 100 UNIT/ML KwikPen  Inject 2-15 Units into the skin 3 (three) times daily after meals. CBG 121 - 150: 2 units, 151 - 200: 3 units, 201 - 250: 5 units, 251 - 300: 8 units, 301 - 350: 11 units, 351 - 400: 15u 06/14/22   Wieting, Richard, MD  Insulin Pen Needle 33G X 5 MM MISC 1 Dose by Does not apply route 3 (three) times daily before meals. 06/14/22   Alford Highland, MD  potassium chloride SA (KLOR-CON M) 10 MEQ tablet Take 1 tablet (10 mEq total) by mouth daily. 06/16/22   Alford Highland, MD  sacubitril-valsartan (ENTRESTO) 24-26 MG Take 1 tablet by mouth 2 (two) times daily. 06/14/22   Alford Highland, MD  spironolactone (ALDACTONE) 25 MG tablet Take 0.5 tablets (12.5 mg total) by mouth daily. 06/16/22   Alford Highland, MD  torsemide (DEMADEX) 20 MG tablet Take one tablet daily (if you gain 3lbs in a day or five pounds in a week can take an extra pill in the afternoon) 06/16/22   Alford Highland, MD      Allergies    Ibuprofen    Review of Systems   Review of Systems  Respiratory:  Positive for shortness of breath.   Cardiovascular:  Positive for leg swelling.  All other systems reviewed and are negative.   Physical Exam Updated Vital Signs BP (!) 170/121 (BP Location: Right Arm)   Pulse (!) 115   Temp 97.8 F (36.6 C)  Resp (!) 22   Ht 5\' 1"  (1.549 m)   Wt 62.6 kg   LMP 02/02/2023   SpO2 92%   BMI 26.07 kg/m   Physical Exam Vitals and nursing note reviewed.  Constitutional:      Appearance: She is well-developed.  HENT:     Head: Normocephalic and atraumatic.  Eyes:     Conjunctiva/sclera: Conjunctivae normal.     Pupils: Pupils are equal, round, and reactive to light.  Cardiovascular:     Rate and Rhythm: Normal rate and regular rhythm.     Heart sounds: Normal heart sounds.  Pulmonary:     Effort: Pulmonary effort is normal.     Breath sounds: Normal breath sounds.  Abdominal:     General: Bowel sounds are normal.     Palpations: Abdomen is soft.  Musculoskeletal:         General: Normal range of motion.     Cervical back: Normal range of motion.     Comments: 2+ pitting edema of BLE, grossly symmetric, no weeping  Skin:    General: Skin is warm and dry.  Neurological:     Mental Status: She is alert and oriented to person, place, and time.     ED Results / Procedures / Treatments   Labs (all labs ordered are listed, but only abnormal results are displayed) Labs Reviewed  BRAIN NATRIURETIC PEPTIDE - Abnormal; Notable for the following components:      Result Value   B Natriuretic Peptide 1,432.8 (*)    All other components within normal limits  COMPREHENSIVE METABOLIC PANEL - Abnormal; Notable for the following components:   Sodium 130 (*)    Potassium 2.7 (*)    Chloride 91 (*)    Glucose, Bld 472 (*)    Creatinine, Ser 1.10 (*)    Calcium 8.5 (*)    Albumin 2.2 (*)    Alkaline Phosphatase 142 (*)    All other components within normal limits  TROPONIN I (HIGH SENSITIVITY) - Abnormal; Notable for the following components:   Troponin I (High Sensitivity) 29 (*)    All other components within normal limits  CBC  HCG, SERUM, QUALITATIVE  MAGNESIUM  TROPONIN I (HIGH SENSITIVITY)    EKG EKG Interpretation Date/Time:  Monday February 23 2023 02:42:30 EDT Ventricular Rate:  112 PR Interval:  166 QRS Duration:  96 QT Interval:  348 QTC Calculation: 475 R Axis:   -45  Text Interpretation: Sinus tachycardia Possible Left atrial enlargement Left anterior fascicular block Minimal voltage criteria for LVH, may be normal variant ( Cornell product ) Nonspecific T wave abnormality Abnormal ECG When compared with ECG of 12-Feb-2023 23:51, PREVIOUS ECG IS PRESENT Confirmed by Marily Memos [phone]) on 02/23/2023 3:21:13 AM  Radiology DG Chest 2 View  Result Date: 02/23/2023 CLINICAL DATA:  Shortness of breath EXAM: CHEST - 2 VIEW COMPARISON:  Chest x-ray 06/12/2022.  CT of the chest 12/31/2021 FINDINGS: The heart is mildly enlarged. There are interstitial  opacities in both lower lungs. There is no pleural effusion or pneumothorax. No acute fractures are seen. IMPRESSION: 1. Mild cardiomegaly. 2. Interstitial opacities in both lower lungs, possibly pulmonary edema. Electronically Signed   By: Darliss Cheney M.D.   On: 02/23/2023 02:58    Procedures Procedures    Medications Ordered in ED Medications  furosemide (LASIX) injection 40 mg (has no administration in time range)  potassium chloride SA (KLOR-CON M) CR tablet 40 mEq (has no administration in time range)  potassium chloride 10 mEq in 100 mL IVPB (has no administration in time range)    ED Course/ Medical Decision Making/ A&P                             Medical Decision Making Amount and/or Complexity of Data Reviewed Labs: ordered. Radiology: ordered and independent interpretation performed. ECG/medicine tests: ordered and independent interpretation performed.  Risk Prescription drug management. Decision regarding hospitalization.   37 year old female presenting to the ED with lower extremity edema and shortness of breath.  Has been off her Lasix for about 2 months as prescription ran out and then had some issues with the pharmacy.  She does have 2+ pitting edema bilateral lower extremities, this is grossly symmetric.  She is in no acute respiratory distress but sats are marginal on room air at 92%.  Workup is consistent with CHF-- BNP 1432, trop 29.  No active chest pain, suspect likely some demand ischemia.  K+ low at 2.7, hx of same.  Will give IV and PO replacement, added Mg+.  CXR with some pulmonary edema.  Given dose of IV lasix.  She will require admission for diuresis.    Discussed with hospitalist, Dr. Janalyn Shy-- will admit for ongoing care.  Final Clinical Impression(s) / ED Diagnoses Final diagnoses:  Acute on chronic congestive heart failure, unspecified heart failure type (HCC)  Non compliance w medication regimen  Hypokalemia    Rx / DC Orders ED Discharge  Orders     None         Garlon Hatchet, PA-C 02/23/23 0509    Palumbo, April, MD 02/23/23 1610

## 2023-02-23 NOTE — Progress Notes (Signed)
Heart Failure Navigator Progress Note  Assessed for Heart & Vascular TOC clinic readiness.  Patient does not meet criteria due to Advanced Heart Failure Team at [facility]. .   Navigator will sign off at this time.   Rhae Hammock, BSN, Scientist, clinical (histocompatibility and immunogenetics) Only

## 2023-02-23 NOTE — ED Notes (Signed)
ED TO INPATIENT HANDOFF REPORT  ED Nurse Name and Phone #: Percival Spanish [phone]  S Name/Age/Gender Cheryl Mercer 37 y.o. female Room/Bed: 040C/040C  Code Status   Code Status: Full Code  Home/SNF/Other Home Patient oriented to: self, place, time, and situation Is this baseline? Yes   Triage Complete: Triage complete  Chief Complaint CHF exacerbation [facility]) [I50.9]  Triage Note Pt complaining of bilateral lower leg swelling for the last 2 weeks, has not been on lasix because she doesn't have any more. Causing some shortness of breath.   Allergies Allergies  Allergen Reactions   Ibuprofen Anaphylaxis    Level of Care/Admitting Diagnosis ED Disposition     ED Disposition  Admit   Condition  --   Comment  Hospital Area: [facility] [[id number]]  Level of Care: Telemetry Medical [104]  May admit patient to [facility] or [facility] if equivalent level of care is available:: No  Covid Evaluation: Asymptomatic - no recent exposure (last 10 days) testing not required  Diagnosis: CHF exacerbation [facility]) [[id number]]  Admitting Physician: Tereasa Coop [[id number]]  Attending Physician: Tereasa Coop [[id number]]  Certification:: I certify this patient will need inpatient services for at least 2 midnights  Estimated Length of Stay: 4          B Medical/Surgery History Past Medical History:  Diagnosis Date   Chronic back pain    Chronic combined systolic and diastolic congestive heart failure (HCC) 09/07/2019   Diabetes mellitus without complication (HCC)    History of COVID-19 11/07/2019   Migraines    Peripheral neuropathy    Pregnancy induced hypertension    Pulmonary embolus (HCC) 12/16/2019   Pulmonary infarct (HCC) 12/17/2019   Past Surgical History:  Procedure Laterality Date   CERVICAL BIOPSY  W/ LOOP ELECTRODE EXCISION  2009   Laparoscopic tubal sterilization with Falope rings.  2009   RIGHT/LEFT HEART CATH AND CORONARY ANGIOGRAPHY N/A  03/25/2019   Procedure: RIGHT/LEFT HEART CATH AND CORONARY ANGIOGRAPHY;  Surgeon: Dolores Patty, MD;  Location: [facility] INVASIVE CV LAB;  Service: Cardiovascular;  Laterality: N/A;   TUBAL LIGATION       A IV Location/Drains/Wounds Patient Lines/Drains/Airways Status     Active Line/Drains/Airways     Name Placement date Placement time Site Days   Peripheral IV 02/23/23 20 G 1.88" Anterior;Right Forearm 02/23/23  0516  Forearm  less than 1            Intake/Output Last 24 hours No intake or output data in the 24 hours ending 02/23/23 1830  Labs/Imaging Results for orders placed or performed during the hospital encounter of 02/23/23 (from the past 48 hour(s))  Brain natriuretic peptide     Status: Abnormal   Collection Time: 02/23/23  2:39 AM  Result Value Ref Range   B Natriuretic Peptide 1,432.8 (H) 0.0 - 100.0 pg/mL    Comment: Performed at [facility], [address] [place], [place] [address]  Troponin I (High Sensitivity)     Status: Abnormal   Collection Time: 02/23/23  2:39 AM  Result Value Ref Range   Troponin I (High Sensitivity) 29 (H) <18 ng/L    Comment: (NOTE) Elevated high sensitivity troponin I (hsTnI) values and significant  changes across serial measurements may suggest ACS but many other  chronic and acute conditions are known to elevate hsTnI results.  Refer to the "Links" section for chest pain algorithms and additional  guidance. Performed at [facility], [address] [place], [place] [address]   Comprehensive metabolic panel     Status: Abnormal   Collection Time: 02/23/23  2:39 AM  Result Value Ref Range   Sodium 130 (L) 135 - 145 mmol/L   Potassium 2.7 (LL) 3.5 - 5.1 mmol/L    Comment: CRITICAL RESULT CALLED TO, READ BACK BY AND VERIFIED WITH MOON,K RN @0400  02/23/23 SATRAINR   Chloride 91 (L) 98 - 111 mmol/L   CO2 27 22 - 32 mmol/L   Glucose, Bld 472 (H) 70 - 99 mg/dL    Comment: Glucose reference range applies only to  samples taken after fasting for at least 8 hours.   BUN 8 6 - 20 mg/dL   Creatinine, Ser 9.62 (H) 0.44 - 1.00 mg/dL   Calcium 8.5 (L) 8.9 - 10.3 mg/dL   Total Protein 6.6 6.5 - 8.1 g/dL   Albumin 2.2 (L) 3.5 - 5.0 g/dL   AST 36 15 - 41 U/L   ALT 12 0 - 44 U/L   Alkaline Phosphatase 142 (H) 38 - 126 U/L   Total Bilirubin 0.7 0.3 - 1.2 mg/dL   GFR, Estimated >95 >28 mL/min    Comment: (NOTE) Calculated using the CKD-EPI Creatinine Equation (2021)    Anion gap 12 5 - 15    Comment: Performed at [facility], [address], [place], [place] [address]  CBC     Status: None   Collection Time: 02/23/23  2:43 AM  Result Value Ref Range   WBC 9.0 4.0 - 10.5 K/uL   RBC 4.21 3.87 - 5.11 MIL/uL   Hemoglobin 12.9 12.0 - 15.0 g/dL   HCT 40.1 02.7 - 25.3 %   MCV 88.6 80.0 - 100.0 fL   MCH 30.6 26.0 - 34.0 pg   MCHC 34.6 30.0 - 36.0 g/dL   RDW 66.4 40.3 - 47.4 %   Platelets 360 150 - 400 K/uL   nRBC 0.2 0.0 - 0.2 %    Comment: Performed at [facility], [address] [place], [place] [address]  hCG, serum, qualitative     Status: None   Collection Time: 02/23/23  2:43 AM  Result Value Ref Range   Preg, Serum NEGATIVE NEGATIVE    Comment:        THE SENSITIVITY OF THIS METHODOLOGY IS >10 mIU/mL. Performed at [facility], [address] [place], [place] [address]   Troponin I (High Sensitivity)     Status: Abnormal   Collection Time: 02/23/23  5:19 AM  Result Value Ref Range   Troponin I (High Sensitivity) 32 (H) <18 ng/L    Comment: (NOTE) Elevated high sensitivity troponin I (hsTnI) values and significant  changes across serial measurements may suggest ACS but many other  chronic and acute conditions are known to elevate hsTnI results.  Refer to the "Links" section for chest pain algorithms and additional  guidance. Performed at [facility], [address] [place], [place] [address]   Magnesium     Status: Abnormal   Collection Time: 02/23/23  5:19  AM  Result Value Ref Range   Magnesium 1.4 (L) 1.7 - 2.4 mg/dL    Comment: Performed at [facility], [address], [place], [place] [address]  CBG monitoring, ED     Status: Abnormal   Collection Time: 02/23/23  9:09 AM  Result Value Ref Range   Glucose-Capillary 440 (H) 70 - 99 mg/dL  Comment: Glucose reference range applies only to samples taken after fasting for at least 8 hours.  HIV Antibody (routine testing w rflx)     Status: None   Collection Time: 02/23/23 10:35 AM  Result Value Ref Range   HIV Screen 4th Generation wRfx Non Reactive Non Reactive    Comment: Performed at [facility], [address], [place], [place] [address]  Renal function panel     Status: Abnormal   Collection Time: 02/23/23 10:35 AM  Result Value Ref Range   Sodium 134 (L) 135 - 145 mmol/L   Potassium 3.8 3.5 - 5.1 mmol/L   Chloride 95 (L) 98 - 111 mmol/L   CO2 25 22 - 32 mmol/L   Glucose, Bld 372 (H) 70 - 99 mg/dL    Comment: Glucose reference range applies only to samples taken after fasting for at least 8 hours.   BUN 8 6 - 20 mg/dL   Creatinine, Ser 6.04 0.44 - 1.00 mg/dL   Calcium 8.9 8.9 - 54.0 mg/dL   Phosphorus 3.6 2.5 - 4.6 mg/dL   Albumin 2.5 (L) 3.5 - 5.0 g/dL   GFR, Estimated >98 >11 mL/min    Comment: (NOTE) Calculated using the CKD-EPI Creatinine Equation (2021)    Anion gap 14 5 - 15    Comment: Performed at [facility], [address], [place], [place] [address]  Magnesium     Status: Abnormal   Collection Time: 02/23/23 10:35 AM  Result Value Ref Range   Magnesium 1.3 (L) 1.7 - 2.4 mg/dL    Comment: Performed at [facility], [address], [place], [place] [address]  CBG monitoring, ED     Status: Abnormal   Collection Time: 02/23/23  1:06 PM  Result Value Ref Range   Glucose-Capillary 214 (H) 70 - 99 mg/dL    Comment: Glucose reference range applies only to samples taken after fasting for at least 8 hours.  CBG monitoring, ED     Status:  Abnormal   Collection Time: 02/23/23  5:05 PM  Result Value Ref Range   Glucose-Capillary 197 (H) 70 - 99 mg/dL    Comment: Glucose reference range applies only to samples taken after fasting for at least 8 hours.   ECHOCARDIOGRAM COMPLETE  Result Date: 02/23/2023    ECHOCARDIOGRAM REPORT   Patient Name:   Cheryl Mercer Date of Exam: 02/23/2023 Medical Rec #:  [id number]              Height:       61.0 in Accession #:    [id number]             Weight:       138.0 lb Date of Birth:  Jun 23, 1986             BSA:          1.613 m Patient Age:    36 years               BP:           148/112 mmHg Patient Gender: F                      HR:           104 bpm. Exam Location:  Inpatient Procedure: 2D Echo, Cardiac Doppler and Color Doppler Indications:    CHF- Acute Systolic  History:        Patient has  prior history of Echocardiogram examinations, most                 recent 06/13/2022. CHF; Risk Factors:Hypertension, Diabetes and                 Dyslipidemia.  Sonographer:    Raeford Razor Referring Phys: [id number] SUBRINA SUNDIL IMPRESSIONS  1. Left ventricular ejection fraction, by estimation, is <20%. The left ventricle has severely decreased function. The left ventricle demonstrates global hypokinesis. The left ventricular internal cavity size was moderately to severely dilated. There is  mild left ventricular hypertrophy. Left ventricular diastolic parameters are consistent with Grade III diastolic dysfunction (restrictive).  2. Right ventricular systolic function is moderately reduced. The right ventricular size is mildly enlarged.  3. Left atrial size was moderately dilated.  4. Right atrial size was moderately dilated.  5. The mitral valve is normal in structure. Mild mitral valve regurgitation. No evidence of mitral stenosis.  6. The aortic valve is normal in structure. Aortic valve regurgitation is not visualized. No aortic stenosis is present.  7. Pulmonic valve regurgitation is moderate.  8. The inferior  vena cava is normal in size with greater than 50% respiratory variability, suggesting right atrial pressure of 3 mmHg. FINDINGS  Left Ventricle: Left ventricular ejection fraction, by estimation, is <20%. The left ventricle has severely decreased function. The left ventricle demonstrates global hypokinesis. The left ventricular internal cavity size was moderately to severely dilated. There is mild left ventricular hypertrophy. Left ventricular diastolic parameters are consistent with Grade III diastolic dysfunction (restrictive). Right Ventricle: The right ventricular size is mildly enlarged. No increase in right ventricular wall thickness. Right ventricular systolic function is moderately reduced. Left Atrium: Left atrial size was moderately dilated. Right Atrium: Right atrial size was moderately dilated. Pericardium: There is no evidence of pericardial effusion. Mitral Valve: The mitral valve is normal in structure. Mild mitral valve regurgitation. No evidence of mitral valve stenosis. Tricuspid Valve: The tricuspid valve is normal in structure. Tricuspid valve regurgitation is mild . No evidence of tricuspid stenosis. Aortic Valve: The aortic valve is normal in structure. Aortic valve regurgitation is not visualized. No aortic stenosis is present. Aortic valve peak gradient measures 3.8 mmHg. Pulmonic Valve: The pulmonic valve was normal in structure. Pulmonic valve regurgitation is moderate. No evidence of pulmonic stenosis. Aorta: The aortic root is normal in size and structure. Venous: The inferior vena cava is normal in size with greater than 50% respiratory variability, suggesting right atrial pressure of 3 mmHg. IAS/Shunts: No atrial level shunt detected by color flow Doppler.  LEFT VENTRICLE PLAX 2D LVIDd:         5.40 cm      Diastology LVIDs:         4.90 cm      LV e' medial:    2.50 cm/s LV PW:         1.40 cm      LV E/e' medial:  10.6 LV IVS:        1.00 cm      LV e' lateral:   2.07 cm/s LVOT diam:      1.90 cm      LV E/e' lateral: 12.9 LV SV:         25 LV SV Index:   15 LVOT Area:     2.84 cm  LV Volumes (MOD) LV vol d, MOD A2C: 221.0 ml LV vol d, MOD A4C: 148.0 ml LV vol s, MOD A2C: 183.0 ml  LV vol s, MOD A4C: 119.0 ml LV SV MOD A2C:     38.0 ml LV SV MOD A4C:     148.0 ml LV SV MOD BP:      30.3 ml RIGHT VENTRICLE            IVC RV Basal diam:  4.00 cm    IVC diam: 1.50 cm RV Mid diam:    4.20 cm RV S prime:     8.92 cm/s TAPSE (M-mode): 1.3 cm LEFT ATRIUM              Index        RIGHT ATRIUM           Index LA diam:        4.00 cm  2.48 cm/m   RA Area:     22.60 cm LA Vol (A2C):   103.0 ml 63.85 ml/m  RA Volume:   80.30 ml  49.78 ml/m LA Vol (A4C):   51.5 ml  31.92 ml/m LA Biplane Vol: 72.2 ml  44.76 ml/m  AORTIC VALVE AV Area (Vmax): 1.58 cm AV Vmax:        97.50 cm/s AV Peak Grad:   3.8 mmHg LVOT Vmax:      54.20 cm/s LVOT Vmean:     36.800 cm/s LVOT VTI:       0.088 m  AORTA Ao Root diam: 2.70 cm Ao Asc diam:  2.20 cm MITRAL VALVE MV Area (PHT): 6.71 cm    SHUNTS MV Decel Time: 113 msec    Systemic VTI:  0.09 m MV E velocity: 26.60 cm/s  Systemic Diam: 1.90 cm MV A velocity: 95.90 cm/s MV E/A ratio:  0.28 Arvilla Meres MD Electronically signed by Arvilla Meres MD Signature Date/Time: 02/23/2023/5:17:42 PM    Final    DG Chest 2 View  Result Date: 02/23/2023 CLINICAL DATA:  Shortness of breath EXAM: CHEST - 2 VIEW COMPARISON:  Chest x-ray 06/12/2022.  CT of the chest 12/31/2021 FINDINGS: The heart is mildly enlarged. There are interstitial opacities in both lower lungs. There is no pleural effusion or pneumothorax. No acute fractures are seen. IMPRESSION: 1. Mild cardiomegaly. 2. Interstitial opacities in both lower lungs, possibly pulmonary edema. Electronically Signed   By: Darliss Cheney M.D.   On: 02/23/2023 02:58    Pending Labs Unresulted Labs (From admission, onward)     Start     Ordered   02/24/23 0500  Comprehensive metabolic panel  Tomorrow morning,   R        02/23/23  0636   02/24/23 0500  CBC  Tomorrow morning,   R        02/23/23 0636   02/24/23 0500  Lipid panel  Tomorrow morning,   R        02/23/23 1658   02/23/23 0637  Hemoglobin A1c  (Glycemic Control (SSI)  Q 4 Hours / Glycemic Control (SSI)  AC +/- HS)  Once,   R       Comments: To assess prior glycemic control    02/23/23 0636            Vitals/Pain Today's Vitals   02/23/23 1600 02/23/23 1700 02/23/23 1800 02/23/23 1801  BP: (!) 146/98 (!) 138/106 (!) 147/108   Pulse: (!) 105 (!) 103 (!) 110   Resp: 17 19 15    Temp:    97.8 F (36.6 C)  TempSrc:    Oral  SpO2: 98% 100% 100%   Weight:  Height:      PainSc:        Isolation Precautions No active isolations  Medications Medications  atorvastatin (LIPITOR) tablet 40 mg (40 mg Oral Given 02/23/23 1740)  spironolactone (ALDACTONE) tablet 12.5 mg (12.5 mg Oral Given 02/23/23 0927)  empagliflozin (JARDIANCE) tablet 10 mg (10 mg Oral Given 02/23/23 0927)  insulin glargine-yfgn (SEMGLEE) injection 40 Units (40 Units Subcutaneous Given 02/23/23 0920)  gabapentin (NEURONTIN) capsule 100 mg (100 mg Oral Given 02/23/23 1529)  furosemide (LASIX) injection 60 mg (60 mg Intravenous Given 02/23/23 1736)  enoxaparin (LOVENOX) injection 40 mg (40 mg Subcutaneous Given 02/23/23 1736)  acetaminophen (TYLENOL) tablet 650 mg (650 mg Oral Given 02/23/23 1155)    Or  acetaminophen (TYLENOL) suppository 650 mg ( Rectal See Alternative 02/23/23 1155)  sodium chloride flush (NS) 0.9 % injection 3 mL (3 mLs Intravenous Given 02/23/23 0928)  sodium chloride flush (NS) 0.9 % injection 3 mL (has no administration in time range)  0.9 %  sodium chloride infusion (has no administration in time range)  ondansetron (ZOFRAN) tablet 4 mg (has no administration in time range)    Or  ondansetron (ZOFRAN) injection 4 mg (has no administration in time range)  hydrALAZINE (APRESOLINE) injection 10 mg (has no administration in time range)  insulin aspart (novoLOG) injection  0-15 Units (3 Units Subcutaneous Given 02/23/23 1734)  insulin aspart (novoLOG) injection 0-5 Units (has no administration in time range)  sacubitril-valsartan (ENTRESTO) 49-51 mg per tablet (has no administration in time range)  digoxin (LANOXIN) tablet 0.125 mg (0.125 mg Oral Given 02/23/23 1828)  furosemide (LASIX) injection 40 mg (40 mg Intravenous Given 02/23/23 0519)  potassium chloride SA (KLOR-CON M) CR tablet 40 mEq (40 mEq Oral Given 02/23/23 0519)  potassium chloride 10 mEq in 100 mL IVPB (0 mEq Intravenous Stopped 02/23/23 0642)  potassium chloride (KLOR-CON) packet 80 mEq (80 mEq Oral Given 02/23/23 0728)  hydrALAZINE (APRESOLINE) tablet 10 mg (10 mg Oral Given 02/23/23 1828)    Mobility walks     Focused Assessments Cardiac Assessment Handoff:  Cardiac Rhythm: Sinus tachycardia Lab Results  Component Value Date   CKTOTAL 116 12/16/2019   Lab Results  Component Value Date   DDIMER 1.80 (H) 06/14/2021   Does the Patient currently have chest pain? No    R Recommendations: See Admitting Provider Note  Report given to:   Additional Notes:

## 2023-02-24 ENCOUNTER — Encounter (HOSPITAL_COMMUNITY): Payer: Self-pay | Admitting: Internal Medicine

## 2023-02-24 ENCOUNTER — Other Ambulatory Visit (HOSPITAL_COMMUNITY): Payer: Self-pay

## 2023-02-24 DIAGNOSIS — I5043 Acute on chronic combined systolic (congestive) and diastolic (congestive) heart failure: Secondary | ICD-10-CM | POA: Diagnosis not present

## 2023-02-24 DIAGNOSIS — I5023 Acute on chronic systolic (congestive) heart failure: Secondary | ICD-10-CM | POA: Diagnosis not present

## 2023-02-24 LAB — COMPREHENSIVE METABOLIC PANEL
ALT: 12 U/L (ref 0–44)
AST: 39 U/L (ref 15–41)
Albumin: 1.9 g/dL — ABNORMAL LOW (ref 3.5–5.0)
Alkaline Phosphatase: 145 U/L — ABNORMAL HIGH (ref 38–126)
Anion gap: 11 (ref 5–15)
BUN: 16 mg/dL (ref 6–20)
CO2: 33 mmol/L — ABNORMAL HIGH (ref 22–32)
Calcium: 8.3 mg/dL — ABNORMAL LOW (ref 8.9–10.3)
Chloride: 90 mmol/L — ABNORMAL LOW (ref 98–111)
Creatinine, Ser: 1.12 mg/dL — ABNORMAL HIGH (ref 0.44–1.00)
GFR, Estimated: >60 mL/min (ref >60)
Glucose, Bld: 349 mg/dL — ABNORMAL HIGH (ref 70–99)
Potassium: 3.3 mmol/L — ABNORMAL LOW (ref 3.5–5.1)
Sodium: 134 mmol/L — ABNORMAL LOW (ref 135–145)
Total Bilirubin: 0.7 mg/dL (ref 0.3–1.2)
Total Protein: 6.2 g/dL — ABNORMAL LOW (ref 6.5–8.1)

## 2023-02-24 LAB — GLUCOSE, CAPILLARY
Glucose-Capillary: 302 mg/dL — ABNORMAL HIGH (ref 70–99)
Glucose-Capillary: 147 mg/dL — ABNORMAL HIGH (ref 70–99)
Glucose-Capillary: 244 mg/dL — ABNORMAL HIGH (ref 70–99)
Glucose-Capillary: 241 mg/dL — ABNORMAL HIGH (ref 70–99)

## 2023-02-24 LAB — BASIC METABOLIC PANEL
Anion gap: 11 (ref 5–15)
BUN: 14 mg/dL (ref 6–20)
CO2: 32 mmol/L (ref 22–32)
Calcium: 8.7 mg/dL — ABNORMAL LOW (ref 8.9–10.3)
Chloride: 91 mmol/L — ABNORMAL LOW (ref 98–111)
Creatinine, Ser: 0.85 mg/dL (ref 0.44–1.00)
GFR, Estimated: >60 mL/min (ref >60)
Glucose, Bld: 154 mg/dL — ABNORMAL HIGH (ref 70–99)
Potassium: 3.8 mmol/L (ref 3.5–5.1)
Sodium: 134 mmol/L — ABNORMAL LOW (ref 135–145)

## 2023-02-24 LAB — CBC
HCT: 41.8 % (ref 36.0–46.0)
Hemoglobin: 14.1 g/dL (ref 12.0–15.0)
MCH: 30.3 pg (ref 26.0–34.0)
MCHC: 33.7 g/dL (ref 30.0–36.0)
MCV: 89.9 fL (ref 80.0–100.0)
Platelets: 386 10*3/uL (ref 150–400)
RBC: 4.65 MIL/uL (ref 3.87–5.11)
RDW: 13.4 % (ref 11.5–15.5)
WBC: 9.4 10*3/uL (ref 4.0–10.5)
nRBC: 0 % (ref 0.0–0.2)

## 2023-02-24 LAB — LIPID PANEL
Cholesterol: 198 mg/dL (ref 0–200)
HDL: 49 mg/dL (ref >40)
LDL Cholesterol: 125 mg/dL — ABNORMAL HIGH (ref 0–99)
Total CHOL/HDL Ratio: 4 RATIO
Triglycerides: 118 mg/dL (ref <150)
VLDL: 24 mg/dL (ref 0–40)

## 2023-02-24 LAB — MAGNESIUM: Magnesium: 1.4 mg/dL — ABNORMAL LOW (ref 1.7–2.4)

## 2023-02-24 MED ORDER — SACUBITRIL-VALSARTAN 97-103 MG PO TABS
1.0000 | ORAL_TABLET | Freq: Two times a day (BID) | ORAL | Status: DC
  Administered 2023-02-24 – 2023-02-26 (×5): 1 via ORAL
  Filled 2023-02-24 (×5): qty 1

## 2023-02-24 MED ORDER — POTASSIUM CHLORIDE CRYS ER 20 MEQ PO TBCR
40.0000 meq | EXTENDED_RELEASE_TABLET | Freq: Once | ORAL | Status: AC
  Administered 2023-02-24: 40 meq via ORAL
  Filled 2023-02-24: qty 2

## 2023-02-24 MED ORDER — SPIRONOLACTONE 25 MG PO TABS
25.0000 mg | ORAL_TABLET | Freq: Every day | ORAL | Status: DC
  Administered 2023-02-24 – 2023-02-26 (×3): 25 mg via ORAL
  Filled 2023-02-24 (×3): qty 1

## 2023-02-24 MED ORDER — MAGNESIUM SULFATE 4 GM/100ML IV SOLN
4.0000 g | Freq: Once | INTRAVENOUS | Status: AC
  Administered 2023-02-24: 4 g via INTRAVENOUS
  Filled 2023-02-24: qty 100

## 2023-02-24 NOTE — Inpatient Diabetes Management (Signed)
Inpatient Diabetes Program Recommendations  AACE/ADA: New Consensus Statement on Inpatient Glycemic Control (2015)  Target Ranges:  Prepandial:   less than 140 mg/dL      Peak postprandial:   less than 180 mg/dL (1-2 hours)      Critically ill patients:  140 - 180 mg/dL   Lab Results  Component Value Date   GLUCAP 147 (H) 02/24/2023   HGBA1C 11.1 (H) 06/13/2022    Review of Glycemic Control  Latest Reference Range & Units 02/23/23 09:09 02/23/23 13:06 02/23/23 17:05 02/23/23 22:32 02/24/23 06:20 02/24/23 10:55  Glucose-Capillary 70 - 99 mg/dL 865 (H) 784 (H) 696 (H) 270 (H) 302 (H) 147 (H)   Diabetes history: DM 2 Outpatient Diabetes medications:  Lantus 40 units Daily, Humalog 2-15 units tid Current orders for Inpatient glycemic control:  Novolog 0-15 units tid + hs Semglee 40 units Daily Jardiance 10 mg Daily  Inpatient Diabetes Program Recommendations:    -  Add Novolog hs scale -  Add Novolog 3 units tid meal coverage if eating >50% of meals  Thanks,  Christena Deem RN, MSN, BC-ADM Inpatient Diabetes Coordinator Team Pager [phone] (8a-5p)

## 2023-02-24 NOTE — Progress Notes (Signed)
RN provided education on strict intake and output. RN placed urine collection basin in toilet and informed patient to notify staff to collect and measure. Patient verbalize understanding.

## 2023-02-24 NOTE — Progress Notes (Signed)
   02/24/23 0957  Vitals  Temp 98.4 F (36.9 C)  Temp Source Oral  BP 115/71  MAP (mmHg) 79  BP Location Left Arm  BP Method Automatic  Patient Position (if appropriate) Sitting  Pulse Rate (!) 110 (post ambulation to bathroom)  Pulse Rate Source Monitor  ECG Heart Rate (!) 114  Resp 15  MEWS COLOR  MEWS Score Color Yellow  Oxygen Therapy  SpO2 99 %  O2 Device Nasal Cannula  Pain Assessment  Pain Scale 0-10  Pain Score 0  MEWS Score  MEWS Temp 0  MEWS Systolic 0  MEWS Pulse 2  MEWS RR 0  MEWS LOC 0  MEWS Score 2   VS obtained after patient ambulated from bathroom. RN administer scheduled PO meds and will continue to monitor HR.

## 2023-02-24 NOTE — Progress Notes (Signed)
Advanced Heart Failure Rounding Note  PCP-Cardiologist: None   Subjective:    I/Os incomplete. Several unmeasured urinary occurences yesterday but she reports robust UOP. Scr 0.97>>1.12 CO2 33 K 3.3   BP remains elevated 150s/110s   Feeling better. Breathing improved.    Objective:   Weight Range: 62.1 kg Body mass index is 25.85 kg/m.   Vital Signs:   Temp:  [97.4 F (36.3 C)-98.5 F (36.9 C)] 97.8 F (36.6 C) (07/09 0727) Pulse Rate:  [101-116] 108 (07/09 0727) Resp:  [12-22] 19 (07/09 0727) BP: (135-157)/(97-119) 157/119 (07/09 0727) SpO2:  [97 %-100 %] 98 % (07/09 0727) Weight:  [62 kg-62.1 kg] 62.1 kg (07/09 0500) Last BM Date : 02/23/23  Weight change: Filed Weights   02/23/23 0234 02/23/23 2132 02/24/23 0500  Weight: 62.6 kg 62 kg 62.1 kg    Intake/Output:   Intake/Output Summary (Last 24 hours) at 02/24/2023 0833 Last data filed at 02/24/2023 0800 Gross per 24 hour  Intake 480 ml  Output --  Net 480 ml      Physical Exam    General:  Well appearing. No resp difficulty HEENT: Normal Neck: Supple. JVP not elevated . Carotids 2+ bilat; no bruits. No lymphadenopathy or thyromegaly appreciated. Cor: PMI nondisplaced. Regular rate & rhythm. No rubs, gallops or murmurs. Lungs: Clear Abdomen: Soft, nontender, nondistended. No hepatosplenomegaly. No bruits or masses. Good bowel sounds. Extremities: No cyanosis, clubbing, rash, edema Neuro: Alert & orientedx3, cranial nerves grossly intact. moves all 4 extremities w/o difficulty. Affect pleasant   Telemetry   Sinus tach 110s.   EKG    No new EKG to review   Labs    CBC Recent Labs    02/23/23 0243 02/24/23 0027  WBC 9.0 9.4  HGB 12.9 14.1  HCT 37.3 41.8  MCV 88.6 89.9  PLT 360 386   Basic Metabolic Panel Recent Labs    16/10/96 0519 02/23/23 1035 02/24/23 0027  NA  --  134* 134*  K  --  3.8 3.3*  CL  --  95* 90*  CO2  --  25 33*  GLUCOSE  --  372* 349*  BUN  --  8 16   CREATININE  --  0.97 1.12*  CALCIUM  --  8.9 8.3*  MG 1.4* 1.3*  --   PHOS  --  3.6  --    Liver Function Tests Recent Labs    02/23/23 0239 02/23/23 1035 02/24/23 0027  AST 36  --  39  ALT 12  --  12  ALKPHOS 142*  --  145*  BILITOT 0.7  --  0.7  PROT 6.6  --  6.2*  ALBUMIN 2.2* 2.5* 1.9*   No results for input(s): "LIPASE", "AMYLASE" in the last 72 hours. Cardiac Enzymes No results for input(s): "CKTOTAL", "CKMB", "CKMBINDEX", "TROPONINI" in the last 72 hours.  BNP: BNP (last 3 results) Recent Labs    06/12/22 0631 02/12/23 2350 02/23/23 0239  BNP 867.5* 1,242.5* 1,432.8*    ProBNP (last 3 results) No results for input(s): "PROBNP" in the last 8760 hours.   D-Dimer No results for input(s): "DDIMER" in the last 72 hours. Hemoglobin A1C No results for input(s): "HGBA1C" in the last 72 hours. Fasting Lipid Panel Recent Labs    02/24/23 0027  CHOL 198  HDL 49  LDLCALC 125*  TRIG 118  CHOLHDL 4.0   Thyroid Function Tests No results for input(s): "TSH", "T4TOTAL", "T3FREE", "THYROIDAB" in the last 72 hours.  Invalid input(s): "FREET3"  Other results:   Imaging    ECHOCARDIOGRAM COMPLETE  Result Date: 02/23/2023    ECHOCARDIOGRAM REPORT   Patient Name:   Cheryl Mercer Date of Exam: 02/23/2023 Medical Rec #:  [id number]              Height:       61.0 in Accession #:    [id number]             Weight:       138.0 lb Date of Birth:  10-May-1986             BSA:          1.613 m Patient Age:    36 years               BP:           148/112 mmHg Patient Gender: F                      HR:           104 bpm. Exam Location:  Inpatient Procedure: 2D Echo, Cardiac Doppler and Color Doppler Indications:    CHF- Acute Systolic  History:        Patient has prior history of Echocardiogram examinations, most                 recent 06/13/2022. CHF; Risk Factors:Hypertension, Diabetes and                 Dyslipidemia.  Sonographer:    Raeford Razor Referring Phys: [id number]  SUBRINA SUNDIL IMPRESSIONS  1. Left ventricular ejection fraction, by estimation, is <20%. The left ventricle has severely decreased function. The left ventricle demonstrates global hypokinesis. The left ventricular internal cavity size was moderately to severely dilated. There is  mild left ventricular hypertrophy. Left ventricular diastolic parameters are consistent with Grade III diastolic dysfunction (restrictive).  2. Right ventricular systolic function is moderately reduced. The right ventricular size is mildly enlarged.  3. Left atrial size was moderately dilated.  4. Right atrial size was moderately dilated.  5. The mitral valve is normal in structure. Mild mitral valve regurgitation. No evidence of mitral stenosis.  6. The aortic valve is normal in structure. Aortic valve regurgitation is not visualized. No aortic stenosis is present.  7. Pulmonic valve regurgitation is moderate.  8. The inferior vena cava is normal in size with greater than 50% respiratory variability, suggesting right atrial pressure of 3 mmHg. FINDINGS  Left Ventricle: Left ventricular ejection fraction, by estimation, is <20%. The left ventricle has severely decreased function. The left ventricle demonstrates global hypokinesis. The left ventricular internal cavity size was moderately to severely dilated. There is mild left ventricular hypertrophy. Left ventricular diastolic parameters are consistent with Grade III diastolic dysfunction (restrictive). Right Ventricle: The right ventricular size is mildly enlarged. No increase in right ventricular wall thickness. Right ventricular systolic function is moderately reduced. Left Atrium: Left atrial size was moderately dilated. Right Atrium: Right atrial size was moderately dilated. Pericardium: There is no evidence of pericardial effusion. Mitral Valve: The mitral valve is normal in structure. Mild mitral valve regurgitation. No evidence of mitral valve stenosis. Tricuspid Valve: The  tricuspid valve is normal in structure. Tricuspid valve regurgitation is mild . No evidence of tricuspid stenosis. Aortic Valve: The aortic valve is normal in structure. Aortic valve regurgitation is not visualized. No aortic stenosis is present. Aortic valve peak gradient measures 3.8  mmHg. Pulmonic Valve: The pulmonic valve was normal in structure. Pulmonic valve regurgitation is moderate. No evidence of pulmonic stenosis. Aorta: The aortic root is normal in size and structure. Venous: The inferior vena cava is normal in size with greater than 50% respiratory variability, suggesting right atrial pressure of 3 mmHg. IAS/Shunts: No atrial level shunt detected by color flow Doppler.  LEFT VENTRICLE PLAX 2D LVIDd:         5.40 cm      Diastology LVIDs:         4.90 cm      LV e' medial:    2.50 cm/s LV PW:         1.40 cm      LV E/e' medial:  10.6 LV IVS:        1.00 cm      LV e' lateral:   2.07 cm/s LVOT diam:     1.90 cm      LV E/e' lateral: 12.9 LV SV:         25 LV SV Index:   15 LVOT Area:     2.84 cm  LV Volumes (MOD) LV vol d, MOD A2C: 221.0 ml LV vol d, MOD A4C: 148.0 ml LV vol s, MOD A2C: 183.0 ml LV vol s, MOD A4C: 119.0 ml LV SV MOD A2C:     38.0 ml LV SV MOD A4C:     148.0 ml LV SV MOD BP:      30.3 ml RIGHT VENTRICLE            IVC RV Basal diam:  4.00 cm    IVC diam: 1.50 cm RV Mid diam:    4.20 cm RV S prime:     8.92 cm/s TAPSE (M-mode): 1.3 cm LEFT ATRIUM              Index        RIGHT ATRIUM           Index LA diam:        4.00 cm  2.48 cm/m   RA Area:     22.60 cm LA Vol (A2C):   103.0 ml 63.85 ml/m  RA Volume:   80.30 ml  49.78 ml/m LA Vol (A4C):   51.5 ml  31.92 ml/m LA Biplane Vol: 72.2 ml  44.76 ml/m  AORTIC VALVE AV Area (Vmax): 1.58 cm AV Vmax:        97.50 cm/s AV Peak Grad:   3.8 mmHg LVOT Vmax:      54.20 cm/s LVOT Vmean:     36.800 cm/s LVOT VTI:       0.088 m  AORTA Ao Root diam: 2.70 cm Ao Asc diam:  2.20 cm MITRAL VALVE MV Area (PHT): 6.71 cm    SHUNTS MV Decel Time: 113  msec    Systemic VTI:  0.09 m MV E velocity: 26.60 cm/s  Systemic Diam: 1.90 cm MV A velocity: 95.90 cm/s MV E/A ratio:  0.28 Arvilla Meres MD Electronically signed by Arvilla Meres MD Signature Date/Time: 02/23/2023/5:17:42 PM    Final      Medications:     Scheduled Medications:  atorvastatin  40 mg Oral q1800   digoxin  0.125 mg Oral Daily   empagliflozin  10 mg Oral QAC breakfast   enoxaparin (LOVENOX) injection  40 mg Subcutaneous Q24H   furosemide  60 mg Intravenous BID   gabapentin  100 mg Oral TID   insulin aspart  0-15 Units  Subcutaneous TID WC   insulin aspart  0-5 Units Subcutaneous QHS   insulin glargine-yfgn  40 Units Subcutaneous Daily   sacubitril-valsartan  1 tablet Oral BID   sodium chloride flush  3 mL Intravenous Q12H   spironolactone  12.5 mg Oral Daily    Infusions:  sodium chloride      PRN Medications: sodium chloride, acetaminophen **OR** acetaminophen, hydrALAZINE, ondansetron **OR** ondansetron (ZOFRAN) IV, sodium chloride flush    Patient Profile   37 year old with chronic systolic heart failure, history of PE, hypertension, poorly controlled type 2 diabetes with extremely poor compliance, multiple no-shows presenting with acute decompensated heart failure exacerbation. Echo w/ biventricular failure, LVEF < 20%, RV moderately reduced.   Assessment/Plan   Acute on chronic biventricular systolic heart failure - echo 96/04 EF <20%, RV severely reduced. Mild concentric LVH, LV with GHK, smoke seen in LA, RA severely dilated, mild MR - Echo this admit EF < 20%, RV moderately reduced  - NYHA IV on admission, suspect CM 2/2 uncontrolled hypertension, ETOH, medication noncompliance and DM - Good UOP per patient report, no UOP documented. Not significantly volume overloaded on exam today. Check ReDs measurement. Can likely transition to PO diuretics today  - Increase spironolactone to 25 mg daily  - Increase Entresto to 97-103 bid - Continue Jardiance  10 mg daily - Hold BB with acute exacerbation - Continue digoxin 0.125 daily   Elevated troponin - no CAD on cath in 2020 - HsTrop 29>32. Denies CP. Likely demand ischemia from ADCHF   Hypertension - SBP 170s on admission - remains elevated, triturating GDMT per above    DM II - reports compliance with insulin at home - Hgb A1c 11.1 10/2. Repeat A1c pending  - SSI while admitted per primary  H/o PE w/ pulmonary infarct - treated with eliquis x6 months 4/21  Alcohol use - Recently had been drinking 5 24oz beer Thur-Sunday to cope with depression - cessation encouraged  7. Hyperlipidemia - LDL elevated 125, Goal in setting of DM < 70  - poor compliance w/ statin PTA. This has been restarted (Atorva 40 mg)    Length of Stay: 1  Josalynn Johndrow, PA-C  02/24/2023, 8:33 AM  Advanced Heart Failure Team Pager [phone] (M-F; 7a - 5p)  Please contact [facility] for night-coverage after hours (5p -7a ) and weekends on [email or web address]

## 2023-02-24 NOTE — Progress Notes (Signed)
REDS Clip  READING= 34%  CHEST RULER = 28 Clip Station = A  Thank you for allowing pharmacy to participate in this patient's care,  Sherron Monday, PharmD, BCCCP Clinical Pharmacist  Phone: [phone] 02/24/2023 11:07 AM  Please check AMION for all [facility] phone numbers After 10:00 PM, call Main Pharmacy [phone]

## 2023-02-24 NOTE — TOC Progression Note (Signed)
Transition of Care [facility]) - Progression Note    Patient Details  Name: Cheryl Mercer MRN: [id number] Date of Birth: [birth year]  Transition [facility] Care [facility]) CM/SW Contact  Reva Bores, LCSWA Phone Number: 02/24/2023, 3:45 PM  Clinical Narrative:  CSW met with the pt at her bedside to collect the desired items from the food pantry for her and her family. CSW collected items and brought them to the pts beside. TOC will continue to follow.     Expected Discharge Plan: Home/Self Care Barriers to Discharge: Continued Medical Work up  Expected Discharge Plan and Services In-house Referral: Clinical Social Work     Living arrangements for the past 2 months: Mobile Home                                       Social Determinants of Health (SDOH) Interventions SDOH Screenings   Food Insecurity: Food Insecurity Present (02/24/2023)  Housing: High Risk (02/24/2023)  Transportation Needs: No Transportation Needs (06/14/2022)  Utilities: At Risk (02/24/2023)  Alcohol Screen: Low Risk  (02/24/2023)  Depression (PHQ2-9): Low Risk  (01/14/2022)  Financial Resource Strain: High Risk (02/24/2023)  Tobacco Use: Low Risk  (02/24/2023)    Readmission Risk Interventions     No data to display

## 2023-02-24 NOTE — TOC Initial Note (Addendum)
Transition of Care [facility]) - Initial/Assessment Note    Patient Details  Name: Cheryl Mercer MRN: [id number] Date of Birth: 05-Feb-1986  Transition of Care [facility]) CM/SW Contact:    Reva Bores, LCSWA Phone Number: 02/24/2023, 12:19 PM  Clinical Narrative: CSW met with the pt at the bedside. Pt stated that she currently lives in home with her four children. Pt stated that she is waiting for her children to come to the hospital to meet and discuss her possible stent surgery, she says she does not want to make the decision alone. Pt stated that she shares a vehicle with her adult children. Pt stated financially things have been challenging. CSW provided food bank resources. Pt is currently receiving food stamps. Pt's PCP is [facility] in [place].   TOC will continue to follow.               Expected Discharge Plan: Home/Self Care Barriers to Discharge: Continued Medical Work up   Patient Goals and CMS Choice Patient states their goals for this hospitalization and ongoing recovery are:: return home to work          Expected Discharge Plan and Services In-house Referral: Clinical Social Work     Living arrangements for the past 2 months: Mobile Home                                      Prior Living Arrangements/Services Living arrangements for the past 2 months: Mobile Home Lives with:: Adult Children, Minor Children Patient language and need for interpreter reviewed:: Yes Do you feel safe going back to the place where you live?: Yes      Need for Family Participation in Patient Care: No (Comment) Care giver support system in place?: Yes (comment)   Criminal Activity/Legal Involvement Pertinent to Current Situation/Hospitalization: No - Comment as needed  Activities of Daily Living      Permission Sought/Granted                  Emotional Assessment Appearance:: Appears stated age Attitude/Demeanor/Rapport: Engaged Affect (typically observed):  Appropriate Orientation: : Oriented to Self, Oriented to Place, Oriented to  Time Alcohol / Substance Use: Alcohol Use, Tobacco Use Psych Involvement: No (comment)  Admission diagnosis:  Hypokalemia [E87.6] CHF exacerbation (HCC) [I50.9] Non compliance w medication regimen [Z91.148] Acute on chronic congestive heart failure, unspecified heart failure type (HCC) [I50.9] Patient Active Problem List   Diagnosis Date Noted   History of chronic CHF EF less than 20% 02/23/2023   CHF exacerbation (HCC) 02/23/2023   AKI (acute kidney injury) (HCC) 02/23/2023   Essential hypertension 02/23/2023   Diabetic neuropathy (HCC) 06/15/2022   Hypomagnesemia 06/14/2022   Obesity (BMI 30-39.9) 06/13/2022   Hypokalemia 06/12/2022   No pertinent past medical history 12/31/2021   Sepsis (HCC) 12/31/2021   Hypotension 12/31/2021   Rhinovirus    CHF (congestive heart failure) (HCC) 06/14/2021   Non compliance w medication regimen 06/14/2021   Hyponatremia 06/14/2021   Acute exacerbation of CHF (congestive heart failure) (HCC) 07/10/2020   Apnea    Pulmonary infarct (HCC) 12/17/2019   Pulmonary embolus (HCC) 12/16/2019   CAP (community acquired pneumonia) 12/16/2019   Dilated cardiomyopathy (HCC) 11/10/2019   History of COVID-19 11/07/2019   Elevated troponin 11/07/2019   Viral pneumonia    Tachycardia    Multifocal pneumonia 09/07/2019   Acute on chronic systolic CHF (congestive heart  failure) (HCC) 09/07/2019   Dyslipidemia 09/07/2019   Back pain 09/07/2019   Atypical pneumonia 09/07/2019   Diabetes mellitus type 2, insulin dependent (HCC) 03/23/2019   Peripheral edema 03/23/2019   Cardiomegaly 03/23/2019   Proteinuria 03/23/2019   Acute bacterial tonsillitis 05/18/2013    Class: Acute   Lower urinary tract infectious disease 07/30/2012   PCP:  Earline Mayotte, MD Pharmacy:   [facility] [phone] - [address] [place] - [address] [place] [address] Phone:  [phone] Fax: [phone]     Social Determinants of Health (SDOH) Social History: SDOH Screenings   Food Insecurity: Food Insecurity Present (02/24/2023)  Housing: High Risk (02/24/2023)  Transportation Needs: No Transportation Needs (06/14/2022)  Utilities: At Risk (02/24/2023)  Alcohol Screen: Low Risk  (02/24/2023)  Depression (PHQ2-9): Low Risk  (01/14/2022)  Financial Resource Strain: High Risk (02/24/2023)  Tobacco Use: Low Risk  (02/24/2023)   SDOH Interventions:     Readmission Risk Interventions     No data to display

## 2023-02-24 NOTE — TOC Benefit Eligibility Note (Signed)
Pharmacy Patient Advocate Encounter  Insurance verification completed.    The patient is insured through [facility]   Ran test claim for Farxiga 10 mg and the current 30 day co-pay is $0.00.  Ran test claim for Jardiance 10 mg and the current 30 day co-pay is $0.00.  Ran test claim for Entresto 24-26 mg and the current 30 day co-pay is $0.00.  This test claim was processed through [facility]- copay amounts may vary at other pharmacies due to pharmacy/plan contracts, or as the patient moves through the different stages of their insurance plan.    Roland Earl, CPHT Pharmacy Patient Advocate Specialist [facility] Patient Advocate Team Direct Number: [phone]  Fax: [phone]

## 2023-02-24 NOTE — Progress Notes (Signed)
PROGRESS NOTE    Cheryl Mercer  ZOX:[id number] DOB: January 30, 1986 DOA: 02/23/2023 PCP: [facility], [facility], MD  Outpatient Specialists:     Brief Narrative:  Patient is a 37 year old female with past medical history significant for chronic systolic heart failure, with EF of less than 20%, type 2 diabetes mellitus on insulin, diabetic neuropathy and hypertension.  Patient was not compliant with her medications for about 2 months.  Patient was admitted with CHF exacerbation.  Patient was started on IV Lasix 60 Mg twice daily.  Symptoms have improved.  Advanced heart failure team is being consulted.  Electrolyte abnormalities noted.  Echocardiogram done on 02/23/2023 revealed: 1. Left ventricular ejection fraction, by estimation, is <20%. The left  ventricle has severely decreased function. The left ventricle demonstrates  global hypokinesis. The left ventricular internal cavity size was  moderately to severely dilated. There is   mild left ventricular hypertrophy. Left ventricular diastolic parameters  are consistent with Grade III diastolic dysfunction (restrictive).   2. Right ventricular systolic function is moderately reduced. The right  ventricular size is mildly enlarged.   3. Left atrial size was moderately dilated.   4. Right atrial size was moderately dilated.   5. The mitral valve is normal in structure. Mild mitral valve  regurgitation. No evidence of mitral stenosis.   6. The aortic valve is normal in structure. Aortic valve regurgitation is  not visualized. No aortic stenosis is present.   7. Pulmonic valve regurgitation is moderate.   8. The inferior vena cava is normal in size with greater than 50%  respiratory variability, suggesting right atrial pressure of 3 mmHg.   02/24/2023: Patient seen.  Symptoms seem to be improving.  Leg edema is improving.  Orthopnea is also improving.  Heart failure team input is highly appreciated.  Patient is currently on IV Lasix 60 Mg  twice daily.   Assessment & Plan:   Principal Problem:   CHF exacerbation (HCC) Active Problems:   Diabetes mellitus type 2, insulin dependent (HCC)   Elevated troponin   Acute exacerbation of CHF (congestive heart failure) (HCC)   Hypokalemia   Diabetic neuropathy (HCC)   History of chronic CHF EF less than 20%   AKI (acute kidney injury) (HCC)   Essential hypertension   CHF exacerbation Systolic heart failure EF less than 20% Hypertrophic cardiomyopathy -Per chart review previous echocardiogram from 05/2022 showed low ejection fraction less than 20%, dilated left ventricle, dilated right ventricle, Dilated left atrium, mitral regurgitation, mild tricuspid regurgitation, calcified aortic valve and trivial pulmonary regurgitation. -Patient reported she ran out of blood pressure regimen and lungs with torsemide for last 2 months.  She has orthopnea, dyspnea, PND and bilateral worsening lower extremity swelling for over the course of 2 weeks.  Unable to lie flat.  Also having some chest pressure.  Denies any substernal chest pain, palpitation, numbness and headache. -Blood on presentation 171/121.  And heart rate 113. -Elevated BNP 1432 -Chest x-ray showed evidence of pulmonary edema -In the ED got Lasix 40 mg IV once. - Continue Lasix 80 mg IV twice daily. - Resuming Coreg 3.125 mg twice daily, Entresto twice daily, Aldactone 12.5 mg daily and Jardiance 10 mg. -Consulted cardiology and heart failure team.  Patient will benefit from LifeVest versus AICD placement? - Please reach out to cardiology in the a.m. -Strict I's/O - Obtaining echocardiogram. - Continue fluid restriction 1.8 L/day and salt restriction 2 g/day -Continue telemonitoring. -On discharge patient need at least 1 month of supply of  medication, need to follow-up with case management for insurance, need to follow-up with cardiology and heart failure clinic outpatient. 02/24/2023: See above documentation.  Patient is  improving with IV Lasix.  Echocardiogram is noted.  Cardiology input is appreciated.  Need for compliance has been discussed.  Continue to manage expectantly.   Hypokalemia -Potassium 2.7 on presentation. -In the ED got KCl IV and oral KCl once -Giving another KCl oral 80 mEq -Check potassium 3 times daily and replete as needed 02/24/2023: Continue to monitor and replete.  Potassium is 3.8 today.   Elevated troponin Elevate troponin from demand ischemia in the setting of CHF exacerbation. -Patient is complaining of chest pressure mostly related to CHF exacerbation. - Elevated troponin 29.  EKG shows sinus rhythm with evidence of left atrial enlargement. No concern for ACS - Continue to trend troponin. -Continue telemonitoring     Essential hypertension Uncontrolled high blood pressure-medication noncompliance -History of hypertension and ran out of blood pressure regimen 2 months ago. - Resumed home Coreg, Entresto and spironolactone. At home torsemide 20 mg daily.  Currently on Lasix 60 mg IV twice daily. 02/24/2023: Blood pressure is controlled.   Diabetic neuropathy -Resumed home gabapentin 100 mg 3 times daily   AKI - Creatinine on presentation 1.1.  Baseline grade Denine within normal range and normal renal function - Prerenal AKI in the setting of CHF exacerbation and elevated blood pressure - Hoping that with diuretics and volume offload renal function will improve. -Monitor urine output and renal function. - Avoid nephrotoxic agent 02/24/2023: Renal function has resolved.  Serum creatinine 0.85 with estimated GFR of greater than 60 mL/min per 1.73 m.   DM type II -Patient reported compliant with insulin regimen however blood glucose 472 on initial presentation.  Normal bicarb and normal anion gap. -Resumed previous home dose of glargine 40 units at bedtime, and continue sliding scale SSI with meals and POC blood glucose checks 3 times daily with meals and with  time. - A1c still pending.  DVT prophylaxis: Subcutaneous Lovenox. Code Status: Full code. Family Communication:  Disposition Plan: Home eventually.   Consultants:  Advanced heart failure team.  Procedures:  Echocardiogram (see above)  Antimicrobials:  None   Subjective: Shortness of breath is improving. Orthopnea is improving. Lower extremity edema is improving.  Objective: Vitals:   02/24/23 0957 02/24/23 1028 02/24/23 1057 02/24/23 1549  BP: 115/71  (!) 141/92 (!) 123/93  Pulse: (!) 110  (!) 108 (!) 105  Resp: 15 14 15 14   Temp: 98.4 F (36.9 C)  97.9 F (36.6 C) 97.6 F (36.4 C)  TempSrc: Oral  Oral Oral  SpO2: 99%  96% 100%  Weight:      Height:        Intake/Output Summary (Last 24 hours) at 02/24/2023 1832 Last data filed at 02/24/2023 1822 Gross per 24 hour  Intake 930 ml  Output 3450 ml  Net -2520 ml   Filed Weights   02/23/23 0234 02/23/23 2132 02/24/23 0500  Weight: 62.6 kg 62 kg 62.1 kg    Examination:  General exam: Appears calm and comfortable  Respiratory system: Clear to auscultation.  Decreased air entry. Cardiovascular system: S1 & S2 heard, systolic murmur. Gastrointestinal system: Abdomen is soft and nontender.  Central nervous system: Alert and oriented.  Extremities: Fullness of the ankle.  Data Reviewed: I have personally reviewed following labs and imaging studies  CBC: Recent Labs  Lab 02/23/23 0243 02/24/23 0027  WBC 9.0 9.4  HGB 12.9 14.1  HCT 37.3 41.8  MCV 88.6 89.9  PLT 360 386   Basic Metabolic Panel: Recent Labs  Lab 02/23/23 0239 02/23/23 0519 02/23/23 1035 02/24/23 0027 02/24/23 1440  NA 130*  --  134* 134* 134*  K 2.7*  --  3.8 3.3* 3.8  CL 91*  --  95* 90* 91*  CO2 27  --  25 33* 32  GLUCOSE 472*  --  372* 349* 154*  BUN 8  --  8 16 14   CREATININE 1.10*  --  0.97 1.12* 0.85  CALCIUM 8.5*  --  8.9 8.3* 8.7*  MG  --  1.4* 1.3* 1.4*  --   PHOS  --   --  3.6  --   --    GFR: Estimated Creatinine  Clearance: 78.2 mL/min (by C-G formula based on SCr of 0.85 mg/dL). Liver Function Tests: Recent Labs  Lab 02/23/23 0239 02/23/23 1035 02/24/23 0027  AST 36  --  39  ALT 12  --  12  ALKPHOS 142*  --  145*  BILITOT 0.7  --  0.7  PROT 6.6  --  6.2*  ALBUMIN 2.2* 2.5* 1.9*   No results for input(s): "LIPASE", "AMYLASE" in the last 168 hours. No results for input(s): "AMMONIA" in the last 168 hours. Coagulation Profile: No results for input(s): "INR", "PROTIME" in the last 168 hours. Cardiac Enzymes: No results for input(s): "CKTOTAL", "CKMB", "CKMBINDEX", "TROPONINI" in the last 168 hours. BNP (last 3 results) No results for input(s): "PROBNP" in the last 8760 hours. HbA1C: No results for input(s): "HGBA1C" in the last 72 hours. CBG: Recent Labs  Lab 02/23/23 1705 02/23/23 2232 02/24/23 0620 02/24/23 1055 02/24/23 1551  GLUCAP 197* 270* 302* 147* 244*   Lipid Profile: Recent Labs    02/24/23 0027  CHOL 198  HDL 49  LDLCALC 125*  TRIG 118  CHOLHDL 4.0   Thyroid Function Tests: No results for input(s): "TSH", "T4TOTAL", "FREET4", "T3FREE", "THYROIDAB" in the last 72 hours. Anemia Panel: No results for input(s): "VITAMINB12", "FOLATE", "FERRITIN", "TIBC", "IRON", "RETICCTPCT" in the last 72 hours. Urine analysis:    Component Value Date/Time   COLORURINE YELLOW 01/06/2022 1000   APPEARANCEUR CLEAR 01/06/2022 1000   LABSPEC 1.020 01/06/2022 1000   PHURINE 6.0 01/06/2022 1000   GLUCOSEU >=500 (A) 01/06/2022 1000   HGBUR TRACE (A) 01/06/2022 1000   BILIRUBINUR NEGATIVE 01/06/2022 1000   KETONESUR NEGATIVE 01/06/2022 1000   PROTEINUR 100 (A) 01/06/2022 1000   UROBILINOGEN 0.2 02/04/2019 1720   NITRITE NEGATIVE 01/06/2022 1000   LEUKOCYTESUR NEGATIVE 01/06/2022 1000   Sepsis Labs: @LABRCNTIP (procalcitonin:4,lacticidven:4)  )No results found for this or any previous visit (from the past 240 hour(s)).       Radiology Studies: ECHOCARDIOGRAM  COMPLETE  Result Date: 02/23/2023    ECHOCARDIOGRAM REPORT   Patient Name:   PRANATHI DORAN Date of Exam: 02/23/2023 Medical Rec #:  [id number]              Height:       61.0 in Accession #:    [id number]             Weight:       138.0 lb Date of Birth:  02-Oct-1985             BSA:          1.613 m Patient Age:    36 years  BP:           148/112 mmHg Patient Gender: F                      HR:           104 bpm. Exam Location:  Inpatient Procedure: 2D Echo, Cardiac Doppler and Color Doppler Indications:    CHF- Acute Systolic  History:        Patient has prior history of Echocardiogram examinations, most                 recent 06/13/2022. CHF; Risk Factors:Hypertension, Diabetes and                 Dyslipidemia.  Sonographer:    Raeford Razor Referring Phys: [id number] SUBRINA SUNDIL IMPRESSIONS  1. Left ventricular ejection fraction, by estimation, is <20%. The left ventricle has severely decreased function. The left ventricle demonstrates global hypokinesis. The left ventricular internal cavity size was moderately to severely dilated. There is  mild left ventricular hypertrophy. Left ventricular diastolic parameters are consistent with Grade III diastolic dysfunction (restrictive).  2. Right ventricular systolic function is moderately reduced. The right ventricular size is mildly enlarged.  3. Left atrial size was moderately dilated.  4. Right atrial size was moderately dilated.  5. The mitral valve is normal in structure. Mild mitral valve regurgitation. No evidence of mitral stenosis.  6. The aortic valve is normal in structure. Aortic valve regurgitation is not visualized. No aortic stenosis is present.  7. Pulmonic valve regurgitation is moderate.  8. The inferior vena cava is normal in size with greater than 50% respiratory variability, suggesting right atrial pressure of 3 mmHg. FINDINGS  Left Ventricle: Left ventricular ejection fraction, by estimation, is <20%. The left ventricle has severely  decreased function. The left ventricle demonstrates global hypokinesis. The left ventricular internal cavity size was moderately to severely dilated. There is mild left ventricular hypertrophy. Left ventricular diastolic parameters are consistent with Grade III diastolic dysfunction (restrictive). Right Ventricle: The right ventricular size is mildly enlarged. No increase in right ventricular wall thickness. Right ventricular systolic function is moderately reduced. Left Atrium: Left atrial size was moderately dilated. Right Atrium: Right atrial size was moderately dilated. Pericardium: There is no evidence of pericardial effusion. Mitral Valve: The mitral valve is normal in structure. Mild mitral valve regurgitation. No evidence of mitral valve stenosis. Tricuspid Valve: The tricuspid valve is normal in structure. Tricuspid valve regurgitation is mild . No evidence of tricuspid stenosis. Aortic Valve: The aortic valve is normal in structure. Aortic valve regurgitation is not visualized. No aortic stenosis is present. Aortic valve peak gradient measures 3.8 mmHg. Pulmonic Valve: The pulmonic valve was normal in structure. Pulmonic valve regurgitation is moderate. No evidence of pulmonic stenosis. Aorta: The aortic root is normal in size and structure. Venous: The inferior vena cava is normal in size with greater than 50% respiratory variability, suggesting right atrial pressure of 3 mmHg. IAS/Shunts: No atrial level shunt detected by color flow Doppler.  LEFT VENTRICLE PLAX 2D LVIDd:         5.40 cm      Diastology LVIDs:         4.90 cm      LV e' medial:    2.50 cm/s LV PW:         1.40 cm      LV E/e' medial:  10.6 LV IVS:        1.00 cm  LV e' lateral:   2.07 cm/s LVOT diam:     1.90 cm      LV E/e' lateral: 12.9 LV SV:         25 LV SV Index:   15 LVOT Area:     2.84 cm  LV Volumes (MOD) LV vol d, MOD A2C: 221.0 ml LV vol d, MOD A4C: 148.0 ml LV vol s, MOD A2C: 183.0 ml LV vol s, MOD A4C: 119.0 ml LV SV  MOD A2C:     38.0 ml LV SV MOD A4C:     148.0 ml LV SV MOD BP:      30.3 ml RIGHT VENTRICLE            IVC RV Basal diam:  4.00 cm    IVC diam: 1.50 cm RV Mid diam:    4.20 cm RV S prime:     8.92 cm/s TAPSE (M-mode): 1.3 cm LEFT ATRIUM              Index        RIGHT ATRIUM           Index LA diam:        4.00 cm  2.48 cm/m   RA Area:     22.60 cm LA Vol (A2C):   103.0 ml 63.85 ml/m  RA Volume:   80.30 ml  49.78 ml/m LA Vol (A4C):   51.5 ml  31.92 ml/m LA Biplane Vol: 72.2 ml  44.76 ml/m  AORTIC VALVE AV Area (Vmax): 1.58 cm AV Vmax:        97.50 cm/s AV Peak Grad:   3.8 mmHg LVOT Vmax:      54.20 cm/s LVOT Vmean:     36.800 cm/s LVOT VTI:       0.088 m  AORTA Ao Root diam: 2.70 cm Ao Asc diam:  2.20 cm MITRAL VALVE MV Area (PHT): 6.71 cm    SHUNTS MV Decel Time: 113 msec    Systemic VTI:  0.09 m MV E velocity: 26.60 cm/s  Systemic Diam: 1.90 cm MV A velocity: 95.90 cm/s MV E/A ratio:  0.28 Arvilla Meres MD Electronically signed by Arvilla Meres MD Signature Date/Time: 02/23/2023/5:17:42 PM    Final    DG Chest 2 View  Result Date: 02/23/2023 CLINICAL DATA:  Shortness of breath EXAM: CHEST - 2 VIEW COMPARISON:  Chest x-ray 06/12/2022.  CT of the chest 12/31/2021 FINDINGS: The heart is mildly enlarged. There are interstitial opacities in both lower lungs. There is no pleural effusion or pneumothorax. No acute fractures are seen. IMPRESSION: 1. Mild cardiomegaly. 2. Interstitial opacities in both lower lungs, possibly pulmonary edema. Electronically Signed   By: Darliss Cheney M.D.   On: 02/23/2023 02:58        Scheduled Meds:  atorvastatin  40 mg Oral q1800   digoxin  0.125 mg Oral Daily   empagliflozin  10 mg Oral QAC breakfast   enoxaparin (LOVENOX) injection  40 mg Subcutaneous Q24H   gabapentin  100 mg Oral TID   insulin aspart  0-15 Units Subcutaneous TID WC   insulin aspart  0-5 Units Subcutaneous QHS   insulin glargine-yfgn  40 Units Subcutaneous Daily   sacubitril-valsartan  1  tablet Oral BID   sodium chloride flush  3 mL Intravenous Q12H   spironolactone  25 mg Oral Daily   Continuous Infusions:  sodium chloride       LOS: 1 day    Time spent: 35  minutes.    Berton Mount, MD  [facility] Pager #: [phone] 7PM-7AM contact night coverage as above

## 2023-02-25 DIAGNOSIS — I5023 Acute on chronic systolic (congestive) heart failure: Secondary | ICD-10-CM | POA: Diagnosis not present

## 2023-02-25 LAB — GLUCOSE, CAPILLARY
Glucose-Capillary: 354 mg/dL — ABNORMAL HIGH (ref 70–99)
Glucose-Capillary: 150 mg/dL — ABNORMAL HIGH (ref 70–99)
Glucose-Capillary: 188 mg/dL — ABNORMAL HIGH (ref 70–99)
Glucose-Capillary: 318 mg/dL — ABNORMAL HIGH (ref 70–99)

## 2023-02-25 LAB — RENAL FUNCTION PANEL
Albumin: 1.8 g/dL — ABNORMAL LOW (ref 3.5–5.0)
Anion gap: 10 (ref 5–15)
BUN: 18 mg/dL (ref 6–20)
CO2: 28 mmol/L (ref 22–32)
Calcium: 8 mg/dL — ABNORMAL LOW (ref 8.9–10.3)
Chloride: 94 mmol/L — ABNORMAL LOW (ref 98–111)
Creatinine, Ser: 1.02 mg/dL — ABNORMAL HIGH (ref 0.44–1.00)
GFR, Estimated: >60 mL/min (ref >60)
Glucose, Bld: 354 mg/dL — ABNORMAL HIGH (ref 70–99)
Phosphorus: 3.7 mg/dL (ref 2.5–4.6)
Potassium: 4.2 mmol/L (ref 3.5–5.1)
Sodium: 132 mmol/L — ABNORMAL LOW (ref 135–145)

## 2023-02-25 LAB — MAGNESIUM: Magnesium: 1.8 mg/dL (ref 1.7–2.4)

## 2023-02-25 LAB — HEMOGLOBIN A1C
Hgb A1c MFr Bld: 13.5 % — ABNORMAL HIGH (ref 4.8–5.6)
Mean Plasma Glucose: 340.75 mg/dL

## 2023-02-25 MED ORDER — LIVING WELL WITH DIABETES BOOK
Freq: Once | Status: AC
  Filled 2023-02-25: qty 1

## 2023-02-25 MED ORDER — TORSEMIDE 20 MG PO TABS
20.0000 mg | ORAL_TABLET | Freq: Every day | ORAL | Status: DC
  Administered 2023-02-25: 20 mg via ORAL
  Filled 2023-02-25: qty 1

## 2023-02-25 MED ORDER — INSULIN ASPART 100 UNIT/ML IJ SOLN
0.0000 [IU] | Freq: Every day | INTRAMUSCULAR | Status: DC
  Administered 2023-02-25: 4 [IU] via SUBCUTANEOUS

## 2023-02-25 MED ORDER — INSULIN ASPART 100 UNIT/ML IJ SOLN
3.0000 [IU] | Freq: Three times a day (TID) | INTRAMUSCULAR | Status: DC
  Administered 2023-02-25 – 2023-02-26 (×2): 3 [IU] via SUBCUTANEOUS

## 2023-02-25 MED ORDER — MAGNESIUM SULFATE 2 GM/50ML IV SOLN
2.0000 g | Freq: Once | INTRAVENOUS | Status: AC
  Administered 2023-02-25: 2 g via INTRAVENOUS
  Filled 2023-02-25: qty 50

## 2023-02-25 MED ORDER — CARVEDILOL 6.25 MG PO TABS
6.2500 mg | ORAL_TABLET | Freq: Two times a day (BID) | ORAL | Status: DC
  Administered 2023-02-25 – 2023-02-26 (×2): 6.25 mg via ORAL
  Filled 2023-02-25 (×2): qty 1

## 2023-02-25 MED ORDER — TORSEMIDE 20 MG PO TABS
40.0000 mg | ORAL_TABLET | Freq: Once | ORAL | Status: AC
  Administered 2023-02-25: 40 mg via ORAL
  Filled 2023-02-25: qty 2

## 2023-02-25 MED ORDER — TORSEMIDE 20 MG PO TABS: 20.0000 mg | ORAL_TABLET | Freq: Once | ORAL | Status: DC

## 2023-02-25 MED ORDER — TORSEMIDE 20 MG PO TABS
40.0000 mg | ORAL_TABLET | Freq: Every day | ORAL | Status: DC
  Administered 2023-02-26: 40 mg via ORAL
  Filled 2023-02-25: qty 2

## 2023-02-25 NOTE — Plan of Care (Signed)
  Problem: Education: Goal: Ability to demonstrate management of disease process will improve Outcome: Progressing   Problem: Activity: Goal: Capacity to carry out activities will improve Outcome: Progressing   

## 2023-02-25 NOTE — Progress Notes (Addendum)
Advanced Heart Failure Rounding Note  PCP-Cardiologist: None   Subjective:    K 4.2, SCr 1.02  Diuresed well with IV lasix, -4L UOP yesterday. Weight unchanged. Family bringing her multiple bottles of water, drinks way more than 2L/day and likes to chew 3-4 cups of ice daily.   BP remains elevated 130s/90s  Feels fine, still unable to lay flat.   Objective:   Weight Range: 63.2 kg Body mass index is 26.31 kg/m.   Vital Signs:   Temp:  [97.6 F (36.4 C)-98.6 F (37 C)] 98.3 F (36.8 C) (07/10 0407) Pulse Rate:  [105-115] 109 (07/10 0407) Resp:  [14-18] 18 (07/10 0407) BP: (115-147)/(71-102) 139/97 (07/10 0407) SpO2:  [96 %-100 %] 98 % (07/10 0407) Weight:  [63.2 kg] 63.2 kg (07/10 0500) Last BM Date : 02/24/23  Weight change: Filed Weights   02/23/23 2132 02/24/23 0500 02/25/23 0500  Weight: 62 kg 62.1 kg 63.2 kg    Intake/Output:   Intake/Output Summary (Last 24 hours) at 02/25/2023 0731 Last data filed at 02/25/2023 0330 Gross per 24 hour  Intake 790 ml  Output 4050 ml  Net -3260 ml    Physical Exam  General:  chronically ill appearing.  No respiratory difficulty HEENT: normal Neck: supple. JVD difficult to see but does not appear elevated. Carotids 2+ bilat; no bruits. No lymphadenopathy or thyromegaly appreciated. Cor: PMI nondisplaced. Tachy rate & reg rhythm. No rubs, gallops or murmurs. Lungs: clear Abdomen: soft, nontender, nondistended. No hepatosplenomegaly. No bruits or masses. Good bowel sounds. Extremities: no cyanosis, clubbing, rash, trace BLE edema  Neuro: alert & oriented x 3, cranial nerves grossly intact. moves all 4 extremities w/o difficulty. Affect pleasant.   Telemetry   ST low 100s (Personally reviewed)     EKG    No new EKG to review   Labs    CBC Recent Labs    02/23/23 0243 02/24/23 0027  WBC 9.0 9.4  HGB 12.9 14.1  HCT 37.3 41.8  MCV 88.6 89.9  PLT 360 386   Basic Metabolic Panel Recent Labs     02/23/23 1035 02/24/23 0027 02/24/23 1440  NA 134* 134* 134*  K 3.8 3.3* 3.8  CL 95* 90* 91*  CO2 25 33* 32  GLUCOSE 372* 349* 154*  BUN 8 16 14   CREATININE 0.97 1.12* 0.85  CALCIUM 8.9 8.3* 8.7*  MG 1.3* 1.4*  --   PHOS 3.6  --   --    Liver Function Tests Recent Labs    02/23/23 0239 02/23/23 1035 02/24/23 0027  AST 36  --  39  ALT 12  --  12  ALKPHOS 142*  --  145*  BILITOT 0.7  --  0.7  PROT 6.6  --  6.2*  ALBUMIN 2.2* 2.5* 1.9*   No results for input(s): "LIPASE", "AMYLASE" in the last 72 hours. Cardiac Enzymes No results for input(s): "CKTOTAL", "CKMB", "CKMBINDEX", "TROPONINI" in the last 72 hours.  BNP: BNP (last 3 results) Recent Labs    06/12/22 0631 02/12/23 2350 02/23/23 0239  BNP 867.5* 1,242.5* 1,432.8*    ProBNP (last 3 results) No results for input(s): "PROBNP" in the last 8760 hours.   D-Dimer No results for input(s): "DDIMER" in the last 72 hours. Hemoglobin A1C No results for input(s): "HGBA1C" in the last 72 hours. Fasting Lipid Panel Recent Labs    02/24/23 0027  CHOL 198  HDL 49  LDLCALC 125*  TRIG 118  CHOLHDL 4.0   Thyroid  Function Tests No results for input(s): "TSH", "T4TOTAL", "T3FREE", "THYROIDAB" in the last 72 hours.  Invalid input(s): "FREET3"  Other results:  Imaging   No results found.  Medications:   Scheduled Medications:  atorvastatin  40 mg Oral q1800   digoxin  0.125 mg Oral Daily   empagliflozin  10 mg Oral QAC breakfast   enoxaparin (LOVENOX) injection  40 mg Subcutaneous Q24H   gabapentin  100 mg Oral TID   insulin aspart  0-15 Units Subcutaneous TID WC   insulin aspart  0-5 Units Subcutaneous QHS   insulin glargine-yfgn  40 Units Subcutaneous Daily   sacubitril-valsartan  1 tablet Oral BID   sodium chloride flush  3 mL Intravenous Q12H   spironolactone  25 mg Oral Daily    Infusions:  sodium chloride      PRN Medications: sodium chloride, acetaminophen **OR** acetaminophen,  hydrALAZINE, ondansetron **OR** ondansetron (ZOFRAN) IV, sodium chloride flush  Patient Profile   37 year old with chronic systolic heart failure, history of PE, hypertension, poorly controlled type 2 diabetes with extremely poor compliance, multiple no-shows presenting with acute decompensated heart failure exacerbation. Echo w/ biventricular failure, LVEF < 20%, RV moderately reduced.   Assessment/Plan  Acute on chronic biventricular systolic heart failure - echo 16/10 EF <20%, RV severely reduced. Mild concentric LVH, LV with GHK, smoke seen in LA, RA severely dilated, mild MR - Echo this admit EF < 20%, RV moderately reduced  - NYHA IV on admission, suspect CM 2/2 uncontrolled hypertension, ETOH, medication noncompliance and DM - Volume appears ok, however weight unchanged. Suspect d/t high volume intake. Will start 40mg  Torsemide daily, discussed 2L fluid restriction and diet - Continue spironolactone to 25 mg daily  - Continue Entresto to 97-103 bid - Stop SGLT2i, A1c 13.5 - Start coreg 6.25 mg BID - Continue digoxin 0.125 daily  Elevated troponin - no CAD on cath in 2020 - HsTrop 29>32. Denies CP. Likely demand ischemia from ADCHF   Hypertension - SBP 170s on admission - remains elevated, titrating GDMT per above   DM II - reports compliance with insulin at home - Hgb A1c 11.1 10/2.  - stopping SGLT2i with A1c 13.5 - SSI while admitted per primary  H/o PE w/ pulmonary infarct - treated with eliquis x6 months 4/21  Alcohol use - Recently had been drinking 5 24oz beer Thur-Sunday to cope with depression - cessation encouraged  7. Hyperlipidemia - LDL elevated 125, Goal in setting of DM < 70  - poor compliance w/ statin PTA. This has been restarted (Atorva 40 mg)    Length of Stay: 2  Alen Bleacher, NP  02/25/2023, 7:31 AM  Advanced Heart Failure Team Pager [phone] (M-F; 7a - 5p)  Please contact [facility] for night-coverage after hours (5p -7a ) and weekends  on [email or web address]

## 2023-02-25 NOTE — Progress Notes (Signed)
PROGRESS NOTE    Cheryl Mercer  NFA:[id number] DOB: 10-Sep-1985 DOA: 02/23/2023 PCP: [facility], [facility], MD     Brief Narrative:  Cheryl Mercer is a 37 year old female with past medical history significant for chronic systolic heart failure, with EF of less than 20%, type 2 diabetes mellitus on insulin, diabetic neuropathy and hypertension.  Patient was not compliant with her medications for about 2 months.  Patient was admitted with CHF exacerbation.  Patient was started on IV Lasix 60 Mg twice daily. Advanced heart failure team is being consulted.   New events last 24 hours / Subjective: Patient without any acute complaints this morning.  Continues to have some leg edema.  Urine output recorded 3950 mL yesterday  Assessment & Plan:   Principal Problem:   Acute on chronic systolic CHF (congestive heart failure) (HCC) Active Problems:   Hypokalemia   History of chronic CHF EF less than 20%   Elevated troponin   Diabetic neuropathy (HCC)   Essential hypertension   Diabetes mellitus type 2, insulin dependent (HCC)   Acute on chronic systolic CHF exacerbation, hypertrophic cardiomyopathy -EF <20% -BNP 1432 -Lasix IV --> torsemide -Coreg, Entresto, Aldactone, Jardiance, digoxin -Heart failure team following -Continue fluid restriction diet  Demand ischemia -Secondary to above  Hypertension -Coreg, Entresto, spironolactone, torsemide  Diabetes mellitus, uncontrolled with hyperglycemia and diabetic neuropathy -Semglee, NovoLog, sliding scale insulin -Gabapentin -Hemoglobin A1c 13.5  Hyperlipidemia -Lipitor   DVT prophylaxis:  enoxaparin (LOVENOX) injection 40 mg Start: 02/23/23 1800 SCDs Start: 02/23/23 4696  Code Status: Full code Family Communication: None Disposition Plan: Plan to discharge home once cleared from cardiology standpoint Status is: Inpatient Remains inpatient appropriate because: Continue CHF medication  titration    Antimicrobials:  Anti-infectives (From admission, onward)    None        Objective: Vitals:   02/25/23 0407 02/25/23 0500 02/25/23 0759 02/25/23 0926  BP: (!) 139/97  (!) 156/116 (!) 160/103  Pulse: (!) 109   (!) 113  Resp: 18  17 16   Temp: 98.3 F (36.8 C)  98.4 F (36.9 C) 98 F (36.7 C)  TempSrc: Oral  Oral Oral  SpO2: 98%   97%  Weight:  63.2 kg    Height:        Intake/Output Summary (Last 24 hours) at 02/25/2023 1017 Last data filed at 02/25/2023 0330 Gross per 24 hour  Intake 690 ml  Output 2250 ml  Net -1560 ml   Filed Weights   02/23/23 2132 02/24/23 0500 02/25/23 0500  Weight: 62 kg 62.1 kg 63.2 kg    Examination:  General exam: Appears calm and comfortable  Respiratory system: Clear to auscultation. Respiratory effort normal. No respiratory distress. No conversational dyspnea.  Cardiovascular system: S1 & S2 heard, tachycardic, regular rhythm, bilateral minimal pedal edema Gastrointestinal system: Abdomen is nondistended, soft and nontender. Normal bowel sounds heard. Central nervous system: Alert and oriented. No focal neurological deficits. Speech clear.  Extremities: Symmetric in appearance  Skin: No rashes, lesions or ulcers on exposed skin  Psychiatry: Judgement and insight appear normal. Mood & affect appropriate.   Data Reviewed: I have personally reviewed following labs and imaging studies  CBC: Recent Labs  Lab 02/23/23 0243 02/24/23 0027  WBC 9.0 9.4  HGB 12.9 14.1  HCT 37.3 41.8  MCV 88.6 89.9  PLT 360 386   Basic Metabolic Panel: Recent Labs  Lab 02/23/23 0239 02/23/23 0519 02/23/23 1035 02/24/23 0027 02/24/23 1440 02/25/23 0657  NA 130*  --  134* 134* 134* 132*  K 2.7*  --  3.8 3.3* 3.8 4.2  CL 91*  --  95* 90* 91* 94*  CO2 27  --  25 33* 32 28  GLUCOSE 472*  --  372* 349* 154* 354*  BUN 8  --  8 16 14 18   CREATININE 1.10*  --  0.97 1.12* 0.85 1.02*  CALCIUM 8.5*  --  8.9 8.3* 8.7* 8.0*  MG  --  1.4*  1.3* 1.4*  --  1.8  PHOS  --   --  3.6  --   --  3.7   GFR: Estimated Creatinine Clearance: 65.6 mL/min (A) (by C-G formula based on SCr of 1.02 mg/dL (H)). Liver Function Tests: Recent Labs  Lab 02/23/23 0239 02/23/23 1035 02/24/23 0027 02/25/23 0657  AST 36  --  39  --   ALT 12  --  12  --   ALKPHOS 142*  --  145*  --   BILITOT 0.7  --  0.7  --   PROT 6.6  --  6.2*  --   ALBUMIN 2.2* 2.5* 1.9* 1.8*   No results for input(s): "LIPASE", "AMYLASE" in the last 168 hours. No results for input(s): "AMMONIA" in the last 168 hours. Coagulation Profile: No results for input(s): "INR", "PROTIME" in the last 168 hours. Cardiac Enzymes: No results for input(s): "CKTOTAL", "CKMB", "CKMBINDEX", "TROPONINI" in the last 168 hours. BNP (last 3 results) No results for input(s): "PROBNP" in the last 8760 hours. HbA1C: Recent Labs    02/25/23 0025  HGBA1C 13.5*   CBG: Recent Labs  Lab 02/24/23 0620 02/24/23 1055 02/24/23 1551 02/24/23 2100 02/25/23 0556  GLUCAP 302* 147* 244* 241* 354*   Lipid Profile: Recent Labs    02/24/23 0027  CHOL 198  HDL 49  LDLCALC 125*  TRIG 118  CHOLHDL 4.0   Thyroid Function Tests: No results for input(s): "TSH", "T4TOTAL", "FREET4", "T3FREE", "THYROIDAB" in the last 72 hours. Anemia Panel: No results for input(s): "VITAMINB12", "FOLATE", "FERRITIN", "TIBC", "IRON", "RETICCTPCT" in the last 72 hours. Sepsis Labs: No results for input(s): "PROCALCITON", "LATICACIDVEN" in the last 168 hours.  No results found for this or any previous visit (from the past 240 hour(s)).    Radiology Studies: ECHOCARDIOGRAM COMPLETE  Result Date: 02/23/2023    ECHOCARDIOGRAM REPORT   Patient Name:   ZETTA STONEMAN Date of Exam: 02/23/2023 Medical Rec #:  [id number]              Height:       61.0 in Accession #:    [id number]             Weight:       138.0 lb Date of Birth:  April 10, 1986             BSA:          1.613 m Patient Age:    36 years                BP:           148/112 mmHg Patient Gender: F                      HR:           104 bpm. Exam Location:  Inpatient Procedure: 2D Echo, Cardiac Doppler and Color Doppler Indications:    CHF- Acute Systolic  History:        Patient has prior history of  Echocardiogram examinations, most                 recent 06/13/2022. CHF; Risk Factors:Hypertension, Diabetes and                 Dyslipidemia.  Sonographer:    Raeford Razor Referring Phys: [id number] SUBRINA SUNDIL IMPRESSIONS  1. Left ventricular ejection fraction, by estimation, is <20%. The left ventricle has severely decreased function. The left ventricle demonstrates global hypokinesis. The left ventricular internal cavity size was moderately to severely dilated. There is  mild left ventricular hypertrophy. Left ventricular diastolic parameters are consistent with Grade III diastolic dysfunction (restrictive).  2. Right ventricular systolic function is moderately reduced. The right ventricular size is mildly enlarged.  3. Left atrial size was moderately dilated.  4. Right atrial size was moderately dilated.  5. The mitral valve is normal in structure. Mild mitral valve regurgitation. No evidence of mitral stenosis.  6. The aortic valve is normal in structure. Aortic valve regurgitation is not visualized. No aortic stenosis is present.  7. Pulmonic valve regurgitation is moderate.  8. The inferior vena cava is normal in size with greater than 50% respiratory variability, suggesting right atrial pressure of 3 mmHg. FINDINGS  Left Ventricle: Left ventricular ejection fraction, by estimation, is <20%. The left ventricle has severely decreased function. The left ventricle demonstrates global hypokinesis. The left ventricular internal cavity size was moderately to severely dilated. There is mild left ventricular hypertrophy. Left ventricular diastolic parameters are consistent with Grade III diastolic dysfunction (restrictive). Right Ventricle: The right ventricular size is  mildly enlarged. No increase in right ventricular wall thickness. Right ventricular systolic function is moderately reduced. Left Atrium: Left atrial size was moderately dilated. Right Atrium: Right atrial size was moderately dilated. Pericardium: There is no evidence of pericardial effusion. Mitral Valve: The mitral valve is normal in structure. Mild mitral valve regurgitation. No evidence of mitral valve stenosis. Tricuspid Valve: The tricuspid valve is normal in structure. Tricuspid valve regurgitation is mild . No evidence of tricuspid stenosis. Aortic Valve: The aortic valve is normal in structure. Aortic valve regurgitation is not visualized. No aortic stenosis is present. Aortic valve peak gradient measures 3.8 mmHg. Pulmonic Valve: The pulmonic valve was normal in structure. Pulmonic valve regurgitation is moderate. No evidence of pulmonic stenosis. Aorta: The aortic root is normal in size and structure. Venous: The inferior vena cava is normal in size with greater than 50% respiratory variability, suggesting right atrial pressure of 3 mmHg. IAS/Shunts: No atrial level shunt detected by color flow Doppler.  LEFT VENTRICLE PLAX 2D LVIDd:         5.40 cm      Diastology LVIDs:         4.90 cm      LV e' medial:    2.50 cm/s LV PW:         1.40 cm      LV E/e' medial:  10.6 LV IVS:        1.00 cm      LV e' lateral:   2.07 cm/s LVOT diam:     1.90 cm      LV E/e' lateral: 12.9 LV SV:         25 LV SV Index:   15 LVOT Area:     2.84 cm  LV Volumes (MOD) LV vol d, MOD A2C: 221.0 ml LV vol d, MOD A4C: 148.0 ml LV vol s, MOD A2C: 183.0 ml LV vol s, MOD  A4C: 119.0 ml LV SV MOD A2C:     38.0 ml LV SV MOD A4C:     148.0 ml LV SV MOD BP:      30.3 ml RIGHT VENTRICLE            IVC RV Basal diam:  4.00 cm    IVC diam: 1.50 cm RV Mid diam:    4.20 cm RV S prime:     8.92 cm/s TAPSE (M-mode): 1.3 cm LEFT ATRIUM              Index        RIGHT ATRIUM           Index LA diam:        4.00 cm  2.48 cm/m   RA Area:     22.60  cm LA Vol (A2C):   103.0 ml 63.85 ml/m  RA Volume:   80.30 ml  49.78 ml/m LA Vol (A4C):   51.5 ml  31.92 ml/m LA Biplane Vol: 72.2 ml  44.76 ml/m  AORTIC VALVE AV Area (Vmax): 1.58 cm AV Vmax:        97.50 cm/s AV Peak Grad:   3.8 mmHg LVOT Vmax:      54.20 cm/s LVOT Vmean:     36.800 cm/s LVOT VTI:       0.088 m  AORTA Ao Root diam: 2.70 cm Ao Asc diam:  2.20 cm MITRAL VALVE MV Area (PHT): 6.71 cm    SHUNTS MV Decel Time: 113 msec    Systemic VTI:  0.09 m MV E velocity: 26.60 cm/s  Systemic Diam: 1.90 cm MV A velocity: 95.90 cm/s MV E/A ratio:  0.28 Arvilla Meres MD Electronically signed by Arvilla Meres MD Signature Date/Time: 02/23/2023/5:17:42 PM    Final       Scheduled Meds:  atorvastatin  40 mg Oral q1800   digoxin  0.125 mg Oral Daily   empagliflozin  10 mg Oral QAC breakfast   enoxaparin (LOVENOX) injection  40 mg Subcutaneous Q24H   gabapentin  100 mg Oral TID   insulin aspart  0-15 Units Subcutaneous TID WC   insulin aspart  0-5 Units Subcutaneous QHS   insulin aspart  0-5 Units Subcutaneous QHS   insulin aspart  3 Units Subcutaneous TID WC   insulin glargine-yfgn  40 Units Subcutaneous Daily   sacubitril-valsartan  1 tablet Oral BID   sodium chloride flush  3 mL Intravenous Q12H   spironolactone  25 mg Oral Daily   torsemide  20 mg Oral Daily   Continuous Infusions:  sodium chloride       LOS: 2 days   Time spent: 35 minutes   Noralee Stain, DO [facility] 02/25/2023, 10:17 AM   Available via Epic secure chat 7am-7pm After these hours, please refer to coverage provider listed on [email or web address]

## 2023-02-25 NOTE — Inpatient Diabetes Management (Signed)
Inpatient Diabetes Program Recommendations  AACE/ADA: New Consensus Statement on Inpatient Glycemic Control (2015)  Target Ranges:  Prepandial:   less than 140 mg/dL      Peak postprandial:   less than 180 mg/dL (1-2 hours)      Critically ill patients:  140 - 180 mg/dL   Lab Results  Component Value Date   GLUCAP 150 (H) 02/25/2023   HGBA1C 13.5 (H) 02/25/2023    Review of Glycemic Control  Diabetes history: type 2 Outpatient Diabetes medications: Lantus 40 units daily, Humalog 2-15 units TID Current orders for Inpatient glycemic control: Semglee 40 units daily, Novolog 0-15 units correction scale TID, Novolog 0-5 units HS scale, Novolog 3 units TID, Jardiance 10 mg daily  Inpatient Diabetes Program Recommendations:   Spoke with patient. Stated that she was diagnosed with diabetes in 2021. Has most recently been on Lantus and Humalog, but states that she has not been taking her insulin like she should. States that she has been taking her Lantus divided in two doses of 20 units BID because the full dose of 40 units at one time makes her feel strange. She will be seeing her PCP in [place] after this admission. Her HgbA1C has been 13.5% and her blood sugars have been running in the 300's at home. She does have a blood glucose meter at home, but needs some more lancets and strips. She also needs insulin pen needles. These were added to the Diabetes Recommendations for discharge orders. States that she would like to have Living Well with Diabetes booklet ordered so that she can have one at home.   Will continue to follow blood sugars while in the hospital.  Smith Mince RN BSN CDE Diabetes Coordinator Pager: [phone]  8am-5pm

## 2023-02-25 NOTE — Progress Notes (Signed)
   02/25/23 0926  Assess: MEWS Score  Temp 98 F (36.7 C)  BP (!) 160/103  MAP (mmHg) 116  Pulse Rate (!) 113  Resp 16  SpO2 97 %  O2 Device Room Air  Assess: MEWS Score  MEWS Temp 0  MEWS Systolic 0  MEWS Pulse 2  MEWS RR 0  MEWS LOC 0  MEWS Score 2  MEWS Score Color Yellow  Assess: if the MEWS score is Yellow or Red  Were vital signs taken at a resting state? Yes  Focused Assessment No change from prior assessment  Does the patient meet 2 or more of the SIRS criteria? No  MEWS guidelines implemented  Yes, yellow  Treat  MEWS Interventions Considered administering scheduled or prn medications/treatments as ordered  Take Vital Signs  Increase Vital Sign Frequency  Yellow: Q2hr x1, continue Q4hrs until patient remains green for 12hrs  Escalate  MEWS: Escalate Yellow: Discuss with charge nurse and consider notifying provider and/or RRT  Notify: Charge Nurse/RN  Name of Charge Nurse/RN Notified Janece Canterbury RN  Assess: SIRS CRITERIA  SIRS Temperature  0  SIRS Pulse 1  SIRS Respirations  0  SIRS WBC 0  SIRS Score Sum  1   Patient is a chronic sinus tachycardia. Patient is lying in bed with no signs of distress. VS stable. RN administer schedule PO meds.

## 2023-02-26 ENCOUNTER — Other Ambulatory Visit (HOSPITAL_COMMUNITY): Payer: Self-pay

## 2023-02-26 DIAGNOSIS — I5023 Acute on chronic systolic (congestive) heart failure: Secondary | ICD-10-CM | POA: Diagnosis not present

## 2023-02-26 DIAGNOSIS — I2489 Other forms of acute ischemic heart disease: Secondary | ICD-10-CM | POA: Insufficient documentation

## 2023-02-26 DIAGNOSIS — E785 Hyperlipidemia, unspecified: Secondary | ICD-10-CM | POA: Insufficient documentation

## 2023-02-26 LAB — BASIC METABOLIC PANEL
Anion gap: 10 (ref 5–15)
BUN: 25 mg/dL — ABNORMAL HIGH (ref 6–20)
CO2: 30 mmol/L (ref 22–32)
Calcium: 8.2 mg/dL — ABNORMAL LOW (ref 8.9–10.3)
Chloride: 94 mmol/L — ABNORMAL LOW (ref 98–111)
Creatinine, Ser: 1.11 mg/dL — ABNORMAL HIGH (ref 0.44–1.00)
GFR, Estimated: >60 mL/min (ref >60)
Glucose, Bld: 253 mg/dL — ABNORMAL HIGH (ref 70–99)
Potassium: 4.1 mmol/L (ref 3.5–5.1)
Sodium: 134 mmol/L — ABNORMAL LOW (ref 135–145)

## 2023-02-26 LAB — GLUCOSE, CAPILLARY
Glucose-Capillary: 331 mg/dL — ABNORMAL HIGH (ref 70–99)
Glucose-Capillary: 190 mg/dL — ABNORMAL HIGH (ref 70–99)

## 2023-02-26 LAB — MAGNESIUM: Magnesium: 2.3 mg/dL (ref 1.7–2.4)

## 2023-02-26 LAB — DIGOXIN LEVEL: Digoxin Level: <0.2 ng/mL — ABNORMAL LOW (ref 0.8–2.0)

## 2023-02-26 MED ORDER — INSULIN GLARGINE-YFGN 100 UNIT/ML ~~LOC~~ SOLN
45.0000 [IU] | Freq: Every day | SUBCUTANEOUS | Status: DC
  Administered 2023-02-26: 45 [IU] via SUBCUTANEOUS
  Filled 2023-02-26: qty 0.45

## 2023-02-26 MED ORDER — ROPINIROLE HCL 0.25 MG PO TABS
0.2500 mg | ORAL_TABLET | Freq: Once | ORAL | Status: AC | PRN
  Administered 2023-02-26: 0.25 mg via ORAL
  Filled 2023-02-26: qty 1

## 2023-02-26 MED ORDER — BLOOD GLUCOSE TEST VI STRP
1.0000 | ORAL_STRIP | Freq: Three times a day (TID) | 0 refills | Status: DC
Start: 2023-02-26 — End: 2023-12-03
  Filled 2023-02-26: qty 100, 34d supply, fill #0

## 2023-02-26 MED ORDER — TORSEMIDE 20 MG PO TABS
40.0000 mg | ORAL_TABLET | Freq: Every day | ORAL | 1 refills | Status: DC
  Filled 2023-02-26: qty 60, 30d supply, fill #0

## 2023-02-26 MED ORDER — LANCETS MISC
1.0000 | Freq: Three times a day (TID) | 0 refills | Status: AC
Start: 2023-02-26 — End: ?
  Filled 2023-02-26: qty 100, 33d supply, fill #0

## 2023-02-26 MED ORDER — LANCET DEVICE MISC
1.0000 | Freq: Three times a day (TID) | 0 refills | Status: DC
Start: 2023-02-26 — End: 2023-12-03
  Filled 2023-02-26: qty 1, fill #0

## 2023-02-26 MED ORDER — CARVEDILOL 6.25 MG PO TABS
6.2500 mg | ORAL_TABLET | Freq: Two times a day (BID) | ORAL | 1 refills | Status: DC
  Filled 2023-02-26: qty 60, 30d supply, fill #0

## 2023-02-26 MED ORDER — DIGOXIN 125 MCG PO TABS
0.1250 mg | ORAL_TABLET | Freq: Every day | ORAL | 1 refills | Status: DC
  Filled 2023-02-26: qty 30, 30d supply, fill #0

## 2023-02-26 MED ORDER — BLOOD GLUCOSE MONITOR SYSTEM W/DEVICE KIT
1.0000 | PACK | Freq: Three times a day (TID) | 0 refills | Status: DC
  Filled 2023-02-26: qty 1, 30d supply, fill #0

## 2023-02-26 MED ORDER — INSULIN PEN NEEDLE 31G X 8 MM MISC
1.0000 | Freq: Three times a day (TID) | 0 refills | Status: DC
Start: 2023-02-26 — End: 2023-12-03
  Filled 2023-02-26: qty 100, 34d supply, fill #0

## 2023-02-26 MED ORDER — ATORVASTATIN CALCIUM 40 MG PO TABS
40.0000 mg | ORAL_TABLET | Freq: Every day | ORAL | 1 refills | Status: DC
  Filled 2023-02-26: qty 30, 30d supply, fill #0

## 2023-02-26 MED ORDER — TRAMADOL HCL 50 MG PO TABS: 50.0000 mg | ORAL_TABLET | Freq: Four times a day (QID) | ORAL | Status: DC | PRN

## 2023-02-26 MED ORDER — SPIRONOLACTONE 25 MG PO TABS
25.0000 mg | ORAL_TABLET | Freq: Every day | ORAL | 1 refills | Status: DC
  Filled 2023-02-26: qty 30, 30d supply, fill #0

## 2023-02-26 MED ORDER — SACUBITRIL-VALSARTAN 97-103 MG PO TABS
1.0000 | ORAL_TABLET | Freq: Two times a day (BID) | ORAL | 1 refills | Status: DC
  Filled 2023-02-26: qty 60, 30d supply, fill #0

## 2023-02-26 NOTE — Progress Notes (Signed)
[facility] night cross cover note:   I was notified by RN that the patient is complaining of muscle cramps in the bilateral lower extremities.  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  I subsequently placed order for a one-time prn dose of Requip for muscle cramps.  She has an existing order for BMP to be checked in the morning.  Have also ordered a serum magnesium level for the morning as well.   Newton Pigg, DO Hospitalist

## 2023-02-26 NOTE — Care Management Important Message (Signed)
Important Message  Patient Details  Name: Cheryl Mercer MRN: [id number] Date of Birth: 1986/03/26   Medicare Important Message Given:  Yes     Renie Ora 02/26/2023, 8:48 AM

## 2023-02-26 NOTE — Progress Notes (Signed)
Advanced Heart Failure Rounding Note  PCP-Cardiologist: None   Subjective:    K 4.1, SCr 1.1  Not much UOP documented, patient reports good UOP. Weight down 4lbs.   BP much improved. SBP 110-120s  Feels ok, no complaints. Unable to lay flat.   Objective:   Weight Range: 61.4 kg Body mass index is 25.54 kg/m.   Vital Signs:   Temp:  [97.5 F (36.4 C)-98.4 F (36.9 C)] 98.3 F (36.8 C) (07/11 0718) Pulse Rate:  [92-113] 103 (07/11 0718) Resp:  [16-19] 18 (07/11 0718) BP: (108-160)/(63-116) 128/85 (07/11 0718) SpO2:  [97 %-100 %] 97 % (07/11 0718) Weight:  [61.4 kg] 61.4 kg (07/11 0325) Last BM Date : 02/24/23  Weight change: Filed Weights   02/24/23 0500 02/25/23 0500 02/26/23 0325  Weight: 62.1 kg 63.2 kg 61.4 kg    Intake/Output:   Intake/Output Summary (Last 24 hours) at 02/26/2023 0726 Last data filed at 02/26/2023 0400 Gross per 24 hour  Intake 840 ml  Output 302 ml  Net 538 ml    Physical Exam  General:  well appearing.  No respiratory difficulty HEENT: normal Neck: supple. JVD ~6 cm. Carotids 2+ bilat; no bruits. No lymphadenopathy or thyromegaly appreciated. Cor: PMI nondisplaced. Tachy rate & reg rhythm. No rubs, gallops or murmurs. Lungs: clear Abdomen: soft, nontender, nondistended. No hepatosplenomegaly. No bruits or masses. Good bowel sounds. Extremities: no cyanosis, clubbing, rash, edema  Neuro: alert & oriented x 3, cranial nerves grossly intact. moves all 4 extremities w/o difficulty. Affect pleasant.   Telemetry   ST low 100s (Personally reviewed)     EKG    No new EKG to review   Labs    CBC Recent Labs    02/24/23 0027  WBC 9.4  HGB 14.1  HCT 41.8  MCV 89.9  PLT 386   Basic Metabolic Panel Recent Labs    16/10/96 1035 02/24/23 0027 02/25/23 0657 02/26/23 0117  NA 134*   < > 132* 134*  K 3.8   < > 4.2 4.1  CL 95*   < > 94* 94*  CO2 25   < > 28 30  GLUCOSE 372*   < > 354* 253*  BUN 8   < > 18 25*  CREATININE  0.97   < > 1.02* 1.11*  CALCIUM 8.9   < > 8.0* 8.2*  MG 1.3*   < > 1.8 2.3  PHOS 3.6  --  3.7  --    < > = values in this interval not displayed.   Liver Function Tests Recent Labs    02/24/23 0027 02/25/23 0657  AST 39  --   ALT 12  --   ALKPHOS 145*  --   BILITOT 0.7  --   PROT 6.2*  --   ALBUMIN 1.9* 1.8*   No results for input(s): "LIPASE", "AMYLASE" in the last 72 hours. Cardiac Enzymes No results for input(s): "CKTOTAL", "CKMB", "CKMBINDEX", "TROPONINI" in the last 72 hours.  BNP: BNP (last 3 results) Recent Labs    06/12/22 0631 02/12/23 2350 02/23/23 0239  BNP 867.5* 1,242.5* 1,432.8*    ProBNP (last 3 results) No results for input(s): "PROBNP" in the last 8760 hours.   D-Dimer No results for input(s): "DDIMER" in the last 72 hours. Hemoglobin A1C Recent Labs    02/25/23 0025  HGBA1C 13.5*   Fasting Lipid Panel Recent Labs    02/24/23 0027  CHOL 198  HDL 49  LDLCALC 125*  TRIG 118  CHOLHDL 4.0   Thyroid Function Tests No results for input(s): "TSH", "T4TOTAL", "T3FREE", "THYROIDAB" in the last 72 hours.  Invalid input(s): "FREET3"  Other results:  Imaging   No results found.  Medications:   Scheduled Medications:  atorvastatin  40 mg Oral q1800   carvedilol  6.25 mg Oral BID WC   digoxin  0.125 mg Oral Daily   enoxaparin (LOVENOX) injection  40 mg Subcutaneous Q24H   gabapentin  100 mg Oral TID   insulin aspart  0-15 Units Subcutaneous TID WC   insulin aspart  0-5 Units Subcutaneous QHS   insulin aspart  3 Units Subcutaneous TID WC   insulin glargine-yfgn  40 Units Subcutaneous Daily   sacubitril-valsartan  1 tablet Oral BID   sodium chloride flush  3 mL Intravenous Q12H   spironolactone  25 mg Oral Daily   torsemide  40 mg Oral Daily    Infusions:  sodium chloride      PRN Medications: sodium chloride, acetaminophen **OR** acetaminophen, hydrALAZINE, ondansetron **OR** ondansetron (ZOFRAN) IV, sodium chloride  flush  Patient Profile   37 year old with chronic systolic heart failure, history of PE, hypertension, poorly controlled type 2 diabetes with extremely poor compliance, multiple no-shows presenting with acute decompensated heart failure exacerbation. Echo w/ biventricular failure, LVEF < 20%, RV moderately reduced.   Assessment/Plan  Acute on chronic biventricular systolic heart failure - echo 16/10 EF <20%, RV severely reduced. Mild concentric LVH, LV with GHK, smoke seen in LA, RA severely dilated, mild MR - Echo this admit EF < 20%, RV moderately reduced  - NYHA IV on admission, suspect CM 2/2 uncontrolled hypertension, ETOH, medication noncompliance and DM - Volume appears ok. Continue 40mg  Torsemide daily, discussed 2L fluid restriction and diet - Continue spironolactone to 25 mg daily  - Continue Entresto 97-103 bid - Stop SGLT2i, A1c 13.5 - Continue coreg 6.25 mg BID - Continue digoxin 0.125 daily  Elevated troponin - no CAD on cath in 2020 - HsTrop 29>32. Denies CP. Likely demand ischemia from ADCHF   Hypertension - SBP 170s on admission - BP much improved, SBP 110s-120s - suspect she would not be compliant with TID hydral.  DM II - reports compliance with insulin at home - Hgb A1c 11.1 10/2.  - stopping SGLT2i with A1c 13.5 - SSI while admitted, per primary  H/o PE w/ pulmonary infarct - treated with eliquis x6 months 4/21  Alcohol use - Recently had been drinking 5 24oz beer Thur-Sunday to cope with depression - cessation encouraged  7. Hyperlipidemia - LDL elevated 125, Goal in setting of DM < 70  - poor compliance w/ statin PTA. This has been restarted (Atorva 40 mg)   Appears ready for discharge today pending MD eval. Once again reiterated importance of adhering to meds and coming to follow up appts. Verbalizes understanding. Has f/u scheduled in AHF clinic (patient preferred [place] [facility]).   AHF meds at discharge: (please send meds to [facility]) Atorvastatin 40 mg  daily Coreg 6.25 mg BID Digoxin 0.125mg  daily Entresto 97/103 mg BID Spironolactone 25 mg daily Torsemide 40 mg daily  Length of Stay: 3  Alen Bleacher, NP  02/26/2023, 7:26 AM  Advanced Heart Failure Team Pager [phone] (M-F; 7a - 5p)  Please contact [facility] for night-coverage after hours (5p -7a ) and weekends on [email or web address]

## 2023-02-26 NOTE — Discharge Summary (Signed)
Physician Discharge Summary  Cheryl Mercer ZOX:[id number] DOB: [birth year] DOA: 02/23/2023  PCP: Earline Mayotte, MD  Admit date: 02/23/2023 Discharge date: 02/26/2023  Admitted From: Home Disposition:  Home   Recommendations for Outpatient Follow-up:  Follow up with cardiology in 1 week  Discharge Condition: Stable CODE STATUS: Full code Diet recommendation: Heart healthy diet, fluid restriction  Brief/Interim Summary: Cheryl Mercer is a 37 year old female with past medical history significant for chronic systolic heart failure, with EF of less than 20%, type 2 diabetes mellitus on insulin, diabetic neuropathy and hypertension.  Patient was not compliant with her medications for about 2 months.  Patient was admitted with CHF exacerbation.  Patient was started on IV Lasix 60 Mg twice daily. Advanced heart failure team was consulted.  She diuresed well with IV Lasix which was transitioned to torsemide.  She was educated again on medication compliance, diet and fluid restriction.    Discharge Diagnoses:   Principal Problem:   Acute on chronic systolic CHF (congestive heart failure) (HCC) Active Problems:   Hypokalemia   History of chronic CHF EF less than 20%   Elevated troponin   Diabetic neuropathy (HCC)   Essential hypertension   Diabetes mellitus type 2, insulin dependent (HCC)   Demand ischemia   HLD (hyperlipidemia)  Discharge Instructions  Discharge Instructions     (HEART FAILURE PATIENTS) Call MD:  Anytime you have any of the following symptoms: 1) 3 pound weight gain in 24 hours or 5 pounds in 1 week 2) shortness of breath, with or without a dry hacking cough 3) swelling in the hands, feet or stomach 4) if you have to sleep on extra pillows at night in order to breathe.   Complete by: As directed    Call MD for:  difficulty breathing, headache or visual disturbances   Complete by: As directed    Call MD for:  extreme fatigue   Complete by: As  directed    Call MD for:  persistant dizziness or light-headedness   Complete by: As directed    Call MD for:  persistant nausea and vomiting   Complete by: As directed    Call MD for:  severe uncontrolled pain   Complete by: As directed    Call MD for:  temperature >100.4   Complete by: As directed    Diet - low sodium heart healthy   Complete by: As directed    Discharge instructions   Complete by: As directed    You were cared for by a hospitalist during your hospital stay. If you have any questions about your discharge medications or the care you received while you were in the hospital after you are discharged, you can call the unit and ask to speak with the hospitalist on call if the hospitalist that took care of you is not available. Once you are discharged, your primary care physician will handle any further medical issues. Please note that NO REFILLS for any discharge medications will be authorized once you are discharged, as it is imperative that you return to your primary care physician (or establish a relationship with a primary care physician if you do not have one) for your aftercare needs so that they can reassess your need for medications and monitor your lab values.   Increase activity slowly   Complete by: As directed       Allergies as of 02/26/2023       Reactions   Ibuprofen Anaphylaxis  Medication List     STOP taking these medications    empagliflozin 10 MG Tabs tablet Commonly known as: Jardiance   Entresto 24-26 MG Generic drug: sacubitril-valsartan Replaced by: sacubitril-valsartan 97-103 MG   gabapentin 100 MG capsule Commonly known as: NEURONTIN   potassium chloride 10 MEQ tablet Commonly known as: KLOR-CON M       TAKE these medications    atorvastatin 40 MG tablet Commonly known as: LIPITOR Take 1 tablet (40 mg total) by mouth daily at 6 PM.   blood glucose meter kit and supplies Kit Dispense based on patient and insurance  preference. Check your sugars prior to meals and at bedtime   Blood Glucose Monitoring Suppl Devi 1 each by Does not apply route 3 (three) times daily. May dispense any manufacturer covered by patient's insurance.   BLOOD GLUCOSE TEST STRIPS Strp 1 each by Does not apply route 3 (three) times daily. Use as directed to check blood sugar. May dispense any manufacturer covered by patient's insurance and fits patient's device.   carvedilol 6.25 MG tablet Commonly known as: COREG Take 1 tablet (6.25 mg total) by mouth 2 (two) times daily with a meal. What changed:  medication strength how much to take   digoxin 0.125 MG tablet Commonly known as: LANOXIN Take 1 tablet (0.125 mg total) by mouth daily.   insulin glargine 100 UNIT/ML Solostar Pen Commonly known as: LANTUS Inject 40 Units into the skin daily.   insulin lispro 100 UNIT/ML KwikPen Commonly known as: HumaLOG KwikPen Inject 2-15 Units into the skin 3 (three) times daily after meals. CBG 121 - 150: 2 units, 151 - 200: 3 units, 201 - 250: 5 units, 251 - 300: 8 units, 301 - 350: 11 units, 351 - 400: 15u   Insulin Pen Needle 33G X 5 MM Misc 1 Dose by Does not apply route 3 (three) times daily before meals. What changed: Another medication with the same name was added. Make sure you understand how and when to take each.   Pen Needles 31G X 5 MM Misc 1 each by Does not apply route 3 (three) times daily. May dispense any manufacturer covered by patient's insurance. What changed: You were already taking a medication with the same name, and this prescription was added. Make sure you understand how and when to take each.   Lancet Device Misc 1 each by Does not apply route 3 (three) times daily. May dispense any manufacturer covered by patient's insurance.   Lancets Misc 1 each by Does not apply route 3 (three) times daily. Use as directed to check blood sugar. May dispense any manufacturer covered by patient's insurance and fits  patient's device.   sacubitril-valsartan 97-103 MG Commonly known as: ENTRESTO Take 1 tablet by mouth 2 (two) times daily. Replaces: Entresto 24-26 MG   spironolactone 25 MG tablet Commonly known as: ALDACTONE Take 1 tablet (25 mg total) by mouth daily. Start taking on: February 27, 2023 What changed: how much to take   Torsemide 40 MG Tabs Take 40 mg by mouth daily. Start taking on: February 27, 2023 What changed:  medication strength how much to take how to take this when to take this additional instructions        Follow-up Information     [facility] Follow up on 03/06/2023.   Specialty: Cardiology Why: Follow up in the [facility] Clinic 7/19 at [address], free valet Contact information: [address] [id number] mc [place] [address] [phone]               Allergies  Allergen Reactions   Ibuprofen Anaphylaxis    Consultations: Cardiology    Procedures/Studies: ECHOCARDIOGRAM COMPLETE  Result Date: 02/23/2023    ECHOCARDIOGRAM REPORT   Patient Name:   Cheryl Mercer Date of Exam: 02/23/2023 Medical Rec #:  [id number]              Height:       61.0 in Accession #:    [id number]             Weight:       138.0 lb Date of Birth:  05/08/1986             BSA:          1.613 m Patient Age:    36 years               BP:           148/112 mmHg Patient Gender: F                      HR:           104 bpm. Exam Location:  Inpatient Procedure: 2D Echo, Cardiac Doppler and Color Doppler Indications:    CHF- Acute Systolic  History:        Patient has prior history of Echocardiogram examinations, most                 recent 06/13/2022. CHF; Risk Factors:Hypertension, Diabetes and                 Dyslipidemia.  Sonographer:    Raeford Razor Referring Phys: [id number] SUBRINA SUNDIL IMPRESSIONS  1. Left ventricular ejection fraction, by estimation, is <20%. The left ventricle has severely  decreased function. The left ventricle demonstrates global hypokinesis. The left ventricular internal cavity size was moderately to severely dilated. There is  mild left ventricular hypertrophy. Left ventricular diastolic parameters are consistent with Grade III diastolic dysfunction (restrictive).  2. Right ventricular systolic function is moderately reduced. The right ventricular size is mildly enlarged.  3. Left atrial size was moderately dilated.  4. Right atrial size was moderately dilated.  5. The mitral valve is normal in structure. Mild mitral valve regurgitation. No evidence of mitral stenosis.  6. The aortic valve is normal in structure. Aortic valve regurgitation is not visualized. No aortic stenosis is present.  7. Pulmonic valve regurgitation is moderate.  8. The inferior vena cava is normal in size with greater than 50% respiratory variability, suggesting right atrial pressure of 3 mmHg. FINDINGS  Left Ventricle: Left ventricular ejection fraction, by estimation, is <20%. The left ventricle has severely decreased function. The left ventricle demonstrates global hypokinesis. The left ventricular internal cavity size was moderately to severely dilated. There is mild left ventricular hypertrophy. Left ventricular diastolic parameters are consistent with Grade III diastolic dysfunction (restrictive). Right Ventricle: The right ventricular size is mildly enlarged. No increase in right ventricular wall thickness. Right ventricular systolic function is moderately reduced. Left Atrium: Left atrial size was moderately dilated. Right Atrium: Right atrial size was moderately dilated. Pericardium: There is no evidence of pericardial effusion. Mitral Valve: The mitral valve is normal in structure. Mild mitral valve regurgitation. No evidence of mitral valve stenosis. Tricuspid Valve: The tricuspid valve is normal in structure. Tricuspid valve regurgitation is mild . No evidence of tricuspid stenosis.  Aortic Valve:  The aortic valve is normal in structure. Aortic valve regurgitation is not visualized. No aortic stenosis is present. Aortic valve peak gradient measures 3.8 mmHg. Pulmonic Valve: The pulmonic valve was normal in structure. Pulmonic valve regurgitation is moderate. No evidence of pulmonic stenosis. Aorta: The aortic root is normal in size and structure. Venous: The inferior vena cava is normal in size with greater than 50% respiratory variability, suggesting right atrial pressure of 3 mmHg. IAS/Shunts: No atrial level shunt detected by color flow Doppler.  LEFT VENTRICLE PLAX 2D LVIDd:         5.40 cm      Diastology LVIDs:         4.90 cm      LV e' medial:    2.50 cm/s LV PW:         1.40 cm      LV E/e' medial:  10.6 LV IVS:        1.00 cm      LV e' lateral:   2.07 cm/s LVOT diam:     1.90 cm      LV E/e' lateral: 12.9 LV SV:         25 LV SV Index:   15 LVOT Area:     2.84 cm  LV Volumes (MOD) LV vol d, MOD A2C: 221.0 ml LV vol d, MOD A4C: 148.0 ml LV vol s, MOD A2C: 183.0 ml LV vol s, MOD A4C: 119.0 ml LV SV MOD A2C:     38.0 ml LV SV MOD A4C:     148.0 ml LV SV MOD BP:      30.3 ml RIGHT VENTRICLE            IVC RV Basal diam:  4.00 cm    IVC diam: 1.50 cm RV Mid diam:    4.20 cm RV S prime:     8.92 cm/s TAPSE (M-mode): 1.3 cm LEFT ATRIUM              Index        RIGHT ATRIUM           Index LA diam:        4.00 cm  2.48 cm/m   RA Area:     22.60 cm LA Vol (A2C):   103.0 ml 63.85 ml/m  RA Volume:   80.30 ml  49.78 ml/m LA Vol (A4C):   51.5 ml  31.92 ml/m LA Biplane Vol: 72.2 ml  44.76 ml/m  AORTIC VALVE AV Area (Vmax): 1.58 cm AV Vmax:        97.50 cm/s AV Peak Grad:   3.8 mmHg LVOT Vmax:      54.20 cm/s LVOT Vmean:     36.800 cm/s LVOT VTI:       0.088 m  AORTA Ao Root diam: 2.70 cm Ao Asc diam:  2.20 cm MITRAL VALVE MV Area (PHT): 6.71 cm    SHUNTS MV Decel Time: 113 msec    Systemic VTI:  0.09 m MV E velocity: 26.60 cm/s  Systemic Diam: 1.90 cm MV A velocity: 95.90 cm/s MV E/A ratio:  0.28  Arvilla Meres MD Electronically signed by Arvilla Meres MD Signature Date/Time: 02/23/2023/5:17:42 PM    Final    DG Chest 2 View  Result Date: 02/23/2023 CLINICAL DATA:  Shortness of breath EXAM: CHEST - 2 VIEW COMPARISON:  Chest x-ray 06/12/2022.  CT of the chest 12/31/2021 FINDINGS: The heart is mildly enlarged. There are interstitial  opacities in both lower lungs. There is no pleural effusion or pneumothorax. No acute fractures are seen. IMPRESSION: 1. Mild cardiomegaly. 2. Interstitial opacities in both lower lungs, possibly pulmonary edema. Electronically Signed   By: Darliss Cheney M.D.   On: 02/23/2023 02:58       Discharge Exam: Vitals:   02/26/23 0325 02/26/23 0718  BP: 124/80 128/85  Pulse: (!) 109 (!) 103  Resp: 17 18  Temp: 98.3 F (36.8 C) 98.3 F (36.8 C)  SpO2: 98% 97%    General: Pt is alert, awake, not in acute distress Cardiovascular: Tachycardic, regular rhythm, S1/S2 +, minimal edema Respiratory: CTA bilaterally, no wheezing, no rhonchi, no respiratory distress, no conversational dyspnea  Abdominal: Soft, NT, ND, bowel sounds + Extremities: no cyanosis Psych: Normal mood and affect, stable judgement and insight     The results of significant diagnostics from this hospitalization (including imaging, microbiology, ancillary and laboratory) are listed below for reference.     Microbiology: No results found for this or any previous visit (from the past 240 hour(s)).   Labs: BNP (last 3 results) Recent Labs    06/12/22 0631 02/12/23 2350 02/23/23 0239  BNP 867.5* 1,242.5* 1,432.8*   Basic Metabolic Panel: Recent Labs  Lab 02/23/23 0519 02/23/23 1035 02/24/23 0027 02/24/23 1440 02/25/23 0657 02/26/23 0117  NA  --  134* 134* 134* 132* 134*  K  --  3.8 3.3* 3.8 4.2 4.1  CL  --  95* 90* 91* 94* 94*  CO2  --  25 33* 32 28 30  GLUCOSE  --  372* 349* 154* 354* 253*  BUN  --  8 16 14 18  25*  CREATININE  --  0.97 1.12* 0.85 1.02* 1.11*  CALCIUM  --   8.9 8.3* 8.7* 8.0* 8.2*  MG 1.4* 1.3* 1.4*  --  1.8 2.3  PHOS  --  3.6  --   --  3.7  --    Liver Function Tests: Recent Labs  Lab 02/23/23 0239 02/23/23 1035 02/24/23 0027 02/25/23 0657  AST 36  --  39  --   ALT 12  --  12  --   ALKPHOS 142*  --  145*  --   BILITOT 0.7  --  0.7  --   PROT 6.6  --  6.2*  --   ALBUMIN 2.2* 2.5* 1.9* 1.8*   No results for input(s): "LIPASE", "AMYLASE" in the last 168 hours. No results for input(s): "AMMONIA" in the last 168 hours. CBC: Recent Labs  Lab 02/23/23 0243 02/24/23 0027  WBC 9.0 9.4  HGB 12.9 14.1  HCT 37.3 41.8  MCV 88.6 89.9  PLT 360 386   Cardiac Enzymes: No results for input(s): "CKTOTAL", "CKMB", "CKMBINDEX", "TROPONINI" in the last 168 hours. BNP: Invalid input(s): "POCBNP" CBG: Recent Labs  Lab 02/25/23 0556 02/25/23 1058 02/25/23 1524 02/25/23 2106 02/26/23 0607  GLUCAP 354* 150* 188* 318* 331*   D-Dimer No results for input(s): "DDIMER" in the last 72 hours. Hgb A1c Recent Labs    02/25/23 0025  HGBA1C 13.5*   Lipid Profile Recent Labs    02/24/23 0027  CHOL 198  HDL 49  LDLCALC 125*  TRIG 118  CHOLHDL 4.0   Thyroid function studies No results for input(s): "TSH", "T4TOTAL", "T3FREE", "THYROIDAB" in the last 72 hours.  Invalid input(s): "FREET3" Anemia work up No results for input(s): "VITAMINB12", "FOLATE", "FERRITIN", "TIBC", "IRON", "RETICCTPCT" in the last 72 hours. Urinalysis    Component Value Date/Time  COLORURINE YELLOW 01/06/2022 1000   APPEARANCEUR CLEAR 01/06/2022 1000   LABSPEC 1.020 01/06/2022 1000   PHURINE 6.0 01/06/2022 1000   GLUCOSEU >=500 (A) 01/06/2022 1000   HGBUR TRACE (A) 01/06/2022 1000   BILIRUBINUR NEGATIVE 01/06/2022 1000   KETONESUR NEGATIVE 01/06/2022 1000   PROTEINUR 100 (A) 01/06/2022 1000   UROBILINOGEN 0.2 02/04/2019 1720   NITRITE NEGATIVE 01/06/2022 1000   LEUKOCYTESUR NEGATIVE 01/06/2022 1000   Sepsis Labs Recent Labs  Lab 02/23/23 0243  02/24/23 0027  WBC 9.0 9.4   Microbiology No results found for this or any previous visit (from the past 240 hour(s)).   Patient was seen and examined on the day of discharge and was found to be in stable condition. Time coordinating discharge: 35 minutes including assessment and coordination of care, as well as examination of the patient.   SIGNED:  Noralee Stain, DO [facility] 02/26/2023, 10:55 AM

## 2023-02-26 NOTE — Plan of Care (Signed)
  Problem: Education: Goal: Ability to demonstrate management of disease process will improve Outcome: Progressing   Problem: Activity: Goal: Capacity to carry out activities will improve Outcome: Progressing   Problem: Cardiac: Goal: Ability to achieve and maintain adequate cardiopulmonary perfusion will improve Outcome: Progressing   RN provided education on fluid restrictions and the importance of daily weights.

## 2023-02-26 NOTE — Plan of Care (Signed)
  Problem: Education: Goal: Ability to demonstrate management of disease process will improve Outcome: Progressing Goal: Ability to verbalize understanding of medication therapies will improve Outcome: Progressing   Problem: Activity: Goal: Capacity to carry out activities will improve Outcome: Progressing   Problem: Nutritional: Goal: Maintenance of adequate nutrition will improve Outcome: Progressing Goal: Progress toward achieving an optimal weight will improve Outcome: Progressing   Problem: Skin Integrity: Goal: Risk for impaired skin integrity will decrease Outcome: Progressing   Problem: Pain Managment: Goal: General experience of comfort will improve Outcome: Progressing   Problem: Safety: Goal: Ability to remain free from injury will improve Outcome: Progressing

## 2023-02-26 NOTE — TOC Transition Note (Addendum)
Transition of Care [facility]) - CM/SW Discharge Note   Patient Details  Name: Cheryl Mercer MRN: [id number] Date of Birth: [birth year]  Transition of Care [facility]) CM/SW Contact:  Leone Haven, RN Phone Number: 02/26/2023, 11:59 AM   Clinical Narrative:    Patient is for dc today, per Patient her PCP is [facility] in [place] 336 [phone].  Secretary will make her a follow up apt. She states she has transport home, she Is just waiting to here if they fixed her vehicle.  TOC to fill meds.  Patient will be on entresto, NCM gave her the 10.00 copay coupon for refills.  Per benefit check copay will be zero dollars.  She has follow up apt with Vernona Rieger on 7/19 at 12 to be established as a new patient.      Barriers to Discharge: Continued Medical Work up   Patient Goals and CMS Choice      Discharge Placement                         Discharge Plan and Services Additional resources added to the After Visit Summary for   In-house Referral: Clinical Social Work                                   Social Determinants of Health (SDOH) Interventions SDOH Screenings   Food Insecurity: Food Insecurity Present (02/24/2023)  Housing: High Risk (02/24/2023)  Transportation Needs: No Transportation Needs (06/14/2022)  Utilities: At Risk (02/24/2023)  Alcohol Screen: Low Risk  (02/24/2023)  Depression (PHQ2-9): Low Risk  (01/14/2022)  Financial Resource Strain: High Risk (02/24/2023)  Tobacco Use: Low Risk  (02/24/2023)     Readmission Risk Interventions     No data to display

## 2023-02-26 NOTE — Progress Notes (Signed)
RN went over discharge instructions with patient at bedside. RN made patient aware of follow up instructions and medication. RN and patient verified TOC medications and patient perform teach back method on discharge medications. RN emphasized the importance of daily weights and maintaining low sodium diet at discharge. Patient verbalize understanding. NT removed PIV. Transportation is being arranged. TOC meds at bedside. Tele monitor removed and CCMD notified of discharge.

## 2023-02-26 NOTE — Progress Notes (Addendum)
Pt Dig Lab was Drawn at 1010 by Lab tech. Held digoxin waiting for Results.

## 2023-03-05 ENCOUNTER — Telehealth (HOSPITAL_COMMUNITY): Payer: Self-pay

## 2023-03-05 NOTE — Telephone Encounter (Signed)
Called and was unable to leave patient a voice message to confirm/remind patient of their appointment at the [facility] on 03/06/23.

## 2023-03-06 ENCOUNTER — Other Ambulatory Visit: Payer: Self-pay

## 2023-03-06 ENCOUNTER — Ambulatory Visit: Payer: 59 | Admitting: Primary Care

## 2023-03-06 ENCOUNTER — Ambulatory Visit (HOSPITAL_COMMUNITY)
Admit: 2023-03-06 | Discharge: 2023-03-06 | Disposition: A | Discharge status: Home / Self Care | Payer: 59 | Attending: Family Medicine | Admitting: Family Medicine

## 2023-03-06 ENCOUNTER — Encounter (HOSPITAL_COMMUNITY): Payer: Self-pay

## 2023-03-06 VITALS — BP 148/98 | HR 109 | Wt 137.2 lb

## 2023-03-06 DIAGNOSIS — Z91148 Patient's other noncompliance with medication regimen for other reason: Secondary | ICD-10-CM | POA: Diagnosis not present

## 2023-03-06 DIAGNOSIS — I5022 Chronic systolic (congestive) heart failure: Secondary | ICD-10-CM

## 2023-03-06 DIAGNOSIS — I10 Essential (primary) hypertension: Secondary | ICD-10-CM | POA: Diagnosis not present

## 2023-03-06 DIAGNOSIS — I5082 Biventricular heart failure: Secondary | ICD-10-CM | POA: Diagnosis not present

## 2023-03-06 DIAGNOSIS — E1142 Type 2 diabetes mellitus with diabetic polyneuropathy: Secondary | ICD-10-CM | POA: Diagnosis not present

## 2023-03-06 DIAGNOSIS — F1011 Alcohol abuse, in remission: Secondary | ICD-10-CM

## 2023-03-06 DIAGNOSIS — Z86711 Personal history of pulmonary embolism: Secondary | ICD-10-CM

## 2023-03-06 DIAGNOSIS — E785 Hyperlipidemia, unspecified: Secondary | ICD-10-CM

## 2023-03-06 DIAGNOSIS — E1165 Type 2 diabetes mellitus with hyperglycemia: Secondary | ICD-10-CM | POA: Diagnosis not present

## 2023-03-06 DIAGNOSIS — Z79899 Other long term (current) drug therapy: Secondary | ICD-10-CM | POA: Diagnosis not present

## 2023-03-06 DIAGNOSIS — G43909 Migraine, unspecified, not intractable, without status migrainosus: Secondary | ICD-10-CM | POA: Insufficient documentation

## 2023-03-06 DIAGNOSIS — F1090 Alcohol use, unspecified, uncomplicated: Secondary | ICD-10-CM | POA: Insufficient documentation

## 2023-03-06 DIAGNOSIS — I110 Hypertensive heart disease with heart failure: Secondary | ICD-10-CM | POA: Diagnosis not present

## 2023-03-06 LAB — BASIC METABOLIC PANEL
Anion gap: 9 (ref 5–15)
BUN: 21 mg/dL — ABNORMAL HIGH (ref 6–20)
CO2: 27 mmol/L (ref 22–32)
Calcium: 8.6 mg/dL — ABNORMAL LOW (ref 8.9–10.3)
Chloride: 98 mmol/L (ref 98–111)
Creatinine, Ser: 0.92 mg/dL (ref 0.44–1.00)
GFR, Estimated: >60 mL/min (ref >60)
Glucose, Bld: 282 mg/dL — ABNORMAL HIGH (ref 70–99)
Potassium: 4.5 mmol/L (ref 3.5–5.1)
Sodium: 134 mmol/L — ABNORMAL LOW (ref 135–145)

## 2023-03-06 LAB — DIGOXIN LEVEL: Digoxin Level: 0.2 ng/mL — ABNORMAL LOW (ref 0.8–2.0)

## 2023-03-06 LAB — BRAIN NATRIURETIC PEPTIDE: B Natriuretic Peptide: 382.8 pg/mL — ABNORMAL HIGH (ref 0.0–100.0)

## 2023-03-06 NOTE — Patient Instructions (Signed)
Medication Changes:  We recommend that you continue on your current medications as directed. Please refer to the Current Medication list given to you today.   *If you need a refill on your cardiac medications before your next appointment, please call your pharmacy*  Lab Work:  Labs done today, your results will be available in MyChart, we will contact you for abnormal readings.\  Your physician has requested that you have an echocardiogram. Echocardiography is a painless test that uses sound waves to create images of your heart. It provides your doctor with information about the size and shape of your heart and how well your heart's chambers and valves are working. This procedure takes approximately one hour. There are no restrictions for this procedure. Please do NOT wear cologne, perfume, aftershave, or lotions (deodorant is allowed). Please arrive 15 minutes prior to your appointment time.    Follow-Up in:   Your physician recommends that you schedule a follow-up appointment in: 3 weeks with pharmacy  6 weeks with APP and 12 weeks with Dr. Gala Romney    Do the following things EVERYDAY: Weigh yourself in the morning before breakfast. Write it down and keep it in a log. Take your medicines as prescribed Eat low salt foods--Limit salt (sodium) to 2000 mg per day.  Stay as active as you can everyday Limit all fluids for the day to less than 2 liters    Need to Contact us:  If you have any questions or concerns before your next appointment please send [place] a message through [place] or call our office at [phone].    TO LEAVE A MESSAGE FOR THE NURSE SELECT OPTION 2, PLEASE LEAVE A MESSAGE INCLUDING: YOUR NAME DATE OF BIRTH CALL BACK NUMBER REASON FOR CALL**this is important as we prioritize the call backs  YOU WILL RECEIVE A CALL BACK THE SAME DAY AS LONG AS YOU CALL BEFORE 4:00 PM   At the [facility], you and your health needs are our priority. As part of  our continuing mission to provide you with exceptional heart care, we have created designated Provider Care Teams. These Care Teams include your primary Cardiologist (physician) and Advanced Practice Providers (APPs- Physician Assistants and Nurse Practitioners) who all work together to provide you with the care you need, when you need it.   You may see any of the following providers on your designated Care Team at your next follow up: Dr Arvilla Meres Dr Marca Ancona Dr. Marcos Eke, NP Robbie Lis, [facility], [place] Brynda Peon, NP Karle Plumber, PharmD   Please be sure to bring in all your medications bottles to every appointment.    Thank you for choosing [facility]

## 2023-03-06 NOTE — Progress Notes (Signed)
ADVANCED [facility] CLINIC NOTE   Primary Care: [facility], [facility], [facility] Primary Cardiologist: Dr. Gala Romney  HPI: Ms. Cryderman is a 37 y.o.  hispanic female with chronic systolic heart failure, h/o PE, migraines, hypertension and poorly controlled type 2 diabetes mellitus. She has previously no showed to multiple AHF clinic visits. Last seen in AHF clinic 5/23. Has been admitted several time with CHF exacerbations 2/2 med noncompliance.    Admitted 7/24 with a/c HF. Echo showed EF < 20%. RV severely reduced. Mild concentric LVH, LV with GHK, smoke seen in LA, RA severely dilated, mild MR. Felt to be 2/2 to ETOH, HTN and medication noncompliance. GDMT titrated and she was discharged home.   Today she returns for HF follow up. Overall feeling fine. She is SOB walking on flat ground. Does OK with ADLs if she takes her time. Has some chest discomfort she attributes to her anxiety about coming to doctor's appt. Occasional dizziness, no syncope. Denies palpitations, abnormal bleeding, edema, or PND but does endorse orthopnea. Appetite ok. No fever or chills. Weight at home 137 pounds. Taking all medications.No tobacco use, smokes THC occasionally, no further ETOH. Unemployed, has 4 children ages, 33, 52, 70, 69.     Cardiac studies  - Echo (7/24): EF < 20%  - Echo 10/23 EF <20%, RV severely reduced. Mild concentric LVH, LV with GHK, smoke seen in LA, RA severely dilated, mild MR  - Echo 5/23 EF 25-30%, GIIIDD, RV mod reduced, LA/RA mildly dilated  - Echo 10/22 EF 30-35%, normal RV  - Echo (2021): EF at 10-15% and moderately reduced RV  - RHC/LHC 03/25/19 showed no CAD. Well compensated hemodynamics with low filling pressures.   - cMRI (8/20): w/ EF at 15% and no significant LGE   Past Medical History:  Diagnosis Date   Chronic back pain    Chronic combined systolic and diastolic congestive heart failure (HCC) 09/07/2019   Diabetes mellitus without complication (HCC)    History of COVID-19  11/07/2019   Migraines    Peripheral neuropathy    Pregnancy induced hypertension    Pulmonary embolus (HCC) 12/16/2019   Pulmonary infarct (HCC) 12/17/2019   Current Outpatient Medications  Medication Sig Dispense Refill   atorvastatin (LIPITOR) 40 MG tablet Take 1 tablet (40 mg total) by mouth daily at 6 PM. 30 tablet 1   blood glucose meter kit and supplies KIT Dispense based on patient and insurance preference. Check your sugars prior to meals and at bedtime 1 each 0   Blood Glucose Monitoring Suppl (BLOOD GLUCOSE MONITOR SYSTEM) w/Device KIT Use 3 (three) times daily. 1 kit 0   carvedilol (COREG) 6.25 MG tablet Take 1 tablet (6.25 mg total) by mouth 2 (two) times daily with a meal. 60 tablet 1   digoxin (LANOXIN) 0.125 MG tablet Take 1 tablet (0.125 mg total) by mouth daily. 30 tablet 1   Glucose Blood (BLOOD GLUCOSE TEST STRIPS) STRP Use 3 (three) times daily. Use as directed to check blood sugar. 100 strip 0   insulin glargine (LANTUS) 100 UNIT/ML Solostar Pen Inject 40 Units into the skin daily. 15 mL 0   insulin lispro (HUMALOG KWIKPEN) 100 UNIT/ML KwikPen Inject 2-15 Units into the skin 3 (three) times daily after meals. CBG 121 - 150: 2 units, 151 - 200: 3 units, 201 - 250: 5 units, 251 - 300: 8 units, 301 - 350: 11 units, 351 - 400: 15u 15 mL 0   Insulin Pen Needle 31G X 8 MM  MISC Use to inject insulin as directed. 100 each 0   Insulin Pen Needle 33G X 5 MM MISC 1 Dose by Does not apply route 3 (three) times daily before meals. 200 each 0   Lancet Device MISC Use 3 (three) times daily. 1 each 0   Lancets MISC Use 3 (three) times daily. Use as directed to check blood sugar. 100 each 0   sacubitril-valsartan (ENTRESTO) 97-103 MG Take 1 tablet by mouth 2 (two) times daily. 60 tablet 1   spironolactone (ALDACTONE) 25 MG tablet Take 1 tablet (25 mg total) by mouth daily. 30 tablet 1   torsemide (DEMADEX) 20 MG tablet Take 2 tablets (40 mg total) by mouth daily. 60 tablet 1   No  current facility-administered medications for this encounter.   Allergies  Allergen Reactions   Ibuprofen Anaphylaxis   Social History   Socioeconomic History   Marital status: Married    Spouse name: Not on file   Number of children: 4   Years of education: Not on file   Highest education level: Not on file  Occupational History   Not on file  Tobacco Use   Smoking status: Never   Smokeless tobacco: Never  Vaping Use   Vaping status: Some Days  Substance and Sexual Activity   Alcohol use: No    Alcohol/week: 6.0 standard drinks of alcohol    Types: 6 Shots of liquor per week   Drug use: No    Types: Marijuana   Sexual activity: Not Currently    Birth control/protection: Surgical    Comment: band placed around tubes in 2009 surgically by Dr. Emelda Fear  Other Topics Concern   Not on file  Social History Narrative   Not on file   Social Determinants of Health   Financial Resource Strain: High Risk (02/24/2023)   Overall Financial Resource Strain (CARDIA)    Difficulty of Paying Living Expenses: Hard  Food Insecurity: Food Insecurity Present (02/24/2023)   Hunger Vital Sign    Worried About Running Out of Food in the Last Year: Sometimes true    Ran Out of Food in the Last Year: Sometimes true  Transportation Needs: No Transportation Needs (06/14/2022)   PRAPARE - Administrator, Civil Service (Medical): No    Lack of Transportation (Non-Medical): No  Physical Activity: Not on file  Stress: Not on file  Social Connections: Not on file  Intimate Partner Violence: Not At Risk (06/14/2022)   Humiliation, Afraid, Rape, and Kick questionnaire    Fear of Current or Ex-Partner: No    Emotionally Abused: No    Physically Abused: No    Sexually Abused: No   Family History  Problem Relation Age of Onset   Hypertension Mother    Anesthesia problems Neg Hx    BP (!) 148/98   Pulse (!) 109   Wt 62.2 kg (137 lb 3.2 oz)   LMP 02/02/2023   SpO2 99%   BMI 25.90  kg/m   Wt Readings from Last 3 Encounters:  03/06/23 62.2 kg (137 lb 3.2 oz)  02/26/23 61.4 kg (135 lb 4.8 oz)  02/12/23 62.6 kg (138 lb)   PHYSICAL EXAM: General:  NAD. No resp difficulty, walked into clinic HEENT: Normal Neck: Supple. No JVD. Carotids 2+ bilat; no bruits. No lymphadenopathy or thryomegaly appreciated. Cor: PMI nondisplaced. Regular rate & rhythm. No rubs, gallops or murmurs. Lungs: Clear Abdomen: Soft, nontender, nondistended. No hepatosplenomegaly. No bruits or masses. Good bowel sounds. Extremities:  No cyanosis, clubbing, rash, edema Neuro: Alert & oriented x 3, cranial nerves grossly intact. Moves all 4 extremities w/o difficulty. Affect pleasant.  ECG (personally reviewed): ST 106 bpm  ASSESSMENT & PLAN: Chronic biventricular systolic heart failure - Echo (10/23): EF <20%, RV severely reduced. Mild concentric LVH, LV with GHK, smoke seen in LA, RA severely dilated, mild MR - Echo (7/24): EF < 20%, RV moderately reduced  - Suspect CM 2/2 uncontrolled hypertension, ETOH, medication noncompliance and DM - NYHA IIb-III, volume OK today.  - Continue torsemide 40 mg daily. - Continue spironolactone 25 mg daily  - Continue Entresto 97-103 mg bid - Continue Coreg 6.25 mg bid - Continue digoxin 0.125 daily - Off SGLT2i, A1c 13.5 - Labs today. - Consider Cardiac Rehab - Repeat echo in 3 months.   2. HTN - BP improved, no med changes today as she has not taken her AM med yet - Suspect she would not be compliant with TID hydral.   3. DM II - A1C 13.5 - Has new PCP appt today. - Off SGLT2i with A1c 13.5   4. H/o PE w/ pulmonary infarct - treated with eliquis x 6 months 4/21   5. Alcohol use - NO further ETOH use since discharge. - Congratulated.   6. HLD - LDL elevated 125, Goal in setting of DM < 70  - Continue atorva 40 mg (recently restarted)  - repeat LFTs and Lipids in 6-8 weeks   Follow up in 3 weeks with PharmD, 6 weeks with APP and 12 weeks  with Dr. Gala Romney + echo  [place], FNP-BC 03/06/23

## 2023-03-26 ENCOUNTER — Inpatient Hospital Stay (HOSPITAL_COMMUNITY)
Admission: RE | Admit: 2023-03-26 | Discharge: 2023-03-26 | Disposition: A | Discharge status: Home / Self Care | Payer: 59 | Source: Ambulatory Visit

## 2023-04-16 ENCOUNTER — Telehealth (HOSPITAL_COMMUNITY): Payer: Self-pay

## 2023-04-16 NOTE — Telephone Encounter (Signed)
Called  to confirm/remind patient of their appointment at the [facility] on 04/17/23. However was unable to leave patient a voice message.

## 2023-04-17 ENCOUNTER — Encounter (HOSPITAL_COMMUNITY): Payer: 59

## 2023-10-26 ENCOUNTER — Encounter (HOSPITAL_COMMUNITY): Payer: Self-pay

## 2023-10-26 ENCOUNTER — Other Ambulatory Visit: Payer: Self-pay

## 2023-10-26 ENCOUNTER — Emergency Department (HOSPITAL_COMMUNITY)
Admission: EM | Admit: 2023-10-26 | Discharge: 2023-10-26 | Discharge status: Against Medical Advice | Attending: Emergency Medicine | Admitting: Emergency Medicine

## 2023-10-26 ENCOUNTER — Emergency Department (HOSPITAL_COMMUNITY)

## 2023-10-26 DIAGNOSIS — Z5321 Procedure and treatment not carried out due to patient leaving prior to being seen by health care provider: Secondary | ICD-10-CM | POA: Insufficient documentation

## 2023-10-26 DIAGNOSIS — S4992XA Unspecified injury of left shoulder and upper arm, initial encounter: Secondary | ICD-10-CM | POA: Insufficient documentation

## 2023-10-26 DIAGNOSIS — W010XXA Fall on same level from slipping, tripping and stumbling without subsequent striking against object, initial encounter: Secondary | ICD-10-CM | POA: Insufficient documentation

## 2023-10-26 NOTE — ED Notes (Signed)
 Pt left without being seen.

## 2023-10-26 NOTE — ED Triage Notes (Signed)
 Pt arrives via POV. PT states she accidentally tripped and fell this morning. Pt states she reached up and grabbed a rail with her right hand to keep her from hitting the ground. Since then patient has been experiencing pain to her right arm and shoulder. CMS intact. Pt AxOx4.

## 2023-10-26 NOTE — ED Notes (Signed)
 Pt called for xray x3 no answer

## 2023-11-03 ENCOUNTER — Emergency Department (HOSPITAL_COMMUNITY)

## 2023-11-03 ENCOUNTER — Emergency Department (HOSPITAL_COMMUNITY)
Admission: EM | Admit: 2023-11-03 | Discharge: 2023-11-03 | Discharge status: Against Medical Advice | Attending: Emergency Medicine | Admitting: Emergency Medicine

## 2023-11-03 ENCOUNTER — Encounter (HOSPITAL_COMMUNITY): Payer: Self-pay

## 2023-11-03 ENCOUNTER — Ambulatory Visit (HOSPITAL_COMMUNITY)
Admission: RE | Admit: 2023-11-03 | Discharge: 2023-11-03 | Disposition: A | Discharge status: Home / Self Care | Source: Ambulatory Visit | Attending: Family Medicine | Admitting: Family Medicine

## 2023-11-03 ENCOUNTER — Other Ambulatory Visit: Payer: Self-pay

## 2023-11-03 VITALS — BP 162/104 | HR 112 | Temp 97.7°F | Resp 28

## 2023-11-03 DIAGNOSIS — R0602 Shortness of breath: Secondary | ICD-10-CM | POA: Insufficient documentation

## 2023-11-03 DIAGNOSIS — Z5321 Procedure and treatment not carried out due to patient leaving prior to being seen by health care provider: Secondary | ICD-10-CM | POA: Diagnosis not present

## 2023-11-03 DIAGNOSIS — E1165 Type 2 diabetes mellitus with hyperglycemia: Secondary | ICD-10-CM

## 2023-11-03 DIAGNOSIS — I509 Heart failure, unspecified: Secondary | ICD-10-CM

## 2023-11-03 DIAGNOSIS — R600 Localized edema: Secondary | ICD-10-CM | POA: Diagnosis not present

## 2023-11-03 DIAGNOSIS — Z794 Long term (current) use of insulin: Secondary | ICD-10-CM

## 2023-11-03 LAB — CBC WITH DIFFERENTIAL/PLATELET
Abs Immature Granulocytes: 0.05 10*3/uL (ref 0.00–0.07)
Basophils Absolute: 0.1 10*3/uL (ref 0.0–0.1)
Basophils Relative: 1 %
Eosinophils Absolute: 0.7 10*3/uL — ABNORMAL HIGH (ref 0.0–0.5)
Eosinophils Relative: 8 %
HCT: 37.6 % (ref 36.0–46.0)
Hemoglobin: 12.4 g/dL (ref 12.0–15.0)
Immature Granulocytes: 1 %
Lymphocytes Relative: 14 %
Lymphs Abs: 1.3 10*3/uL (ref 0.7–4.0)
MCH: 31.1 pg (ref 26.0–34.0)
MCHC: 33 g/dL (ref 30.0–36.0)
MCV: 94.2 fL (ref 80.0–100.0)
Monocytes Absolute: 0.6 10*3/uL (ref 0.1–1.0)
Monocytes Relative: 7 %
Neutro Abs: 6.3 10*3/uL (ref 1.7–7.7)
Neutrophils Relative %: 69 %
Platelets: 261 10*3/uL (ref 150–400)
RBC: 3.99 MIL/uL (ref 3.87–5.11)
RDW: 14.7 % (ref 11.5–15.5)
WBC: 8.9 10*3/uL (ref 4.0–10.5)
nRBC: 0 % (ref 0.0–0.2)

## 2023-11-03 LAB — BASIC METABOLIC PANEL
Anion gap: 8 (ref 5–15)
BUN: 16 mg/dL (ref 6–20)
CO2: 27 mmol/L (ref 22–32)
Calcium: 8 mg/dL — ABNORMAL LOW (ref 8.9–10.3)
Chloride: 98 mmol/L (ref 98–111)
Creatinine, Ser: 1.21 mg/dL — ABNORMAL HIGH (ref 0.44–1.00)
GFR, Estimated: 60 mL/min — ABNORMAL LOW (ref >60)
Glucose, Bld: 337 mg/dL — ABNORMAL HIGH (ref 70–99)
Potassium: 3.4 mmol/L — ABNORMAL LOW (ref 3.5–5.1)
Sodium: 133 mmol/L — ABNORMAL LOW (ref 135–145)

## 2023-11-03 LAB — BRAIN NATRIURETIC PEPTIDE: B Natriuretic Peptide: 1882.2 pg/mL — ABNORMAL HIGH (ref 0.0–100.0)

## 2023-11-03 LAB — POCT FASTING CBG KUC MANUAL ENTRY: POCT Glucose (KUC): 379 mg/dL — AB (ref 70–99)

## 2023-11-03 NOTE — ED Triage Notes (Signed)
 Pt c/o SOB and bilat leg [id number]. Pt has 2+ edema of legs bilat. Pt is able to speak in complete sentences. Pt states she has been out of her lasix for the past couple of months.

## 2023-11-03 NOTE — ED Notes (Signed)
 Pt called for room x2 with no answer. Pt not in triage or in scans.

## 2023-11-03 NOTE — Discharge Instructions (Signed)
 Labs Reviewed  POCT FASTING CBG [facility] MANUAL ENTRY - Abnormal; Notable for the following components:      Result Value   POCT Glucose ([facility]) 379 (*)    All other components within normal limits

## 2023-11-03 NOTE — ED Triage Notes (Addendum)
 Pt reports hx CHF. C/O BLE edema, swelling to torso with dypsnea onset approx 2 wks ago. States has appt set with new PCP at end of April. C/O generalized pain, chest pain. States has been out of meds "for months", but still taking her insulin.

## 2023-11-03 NOTE — ED Notes (Signed)
 Patient is being discharged from the Urgent Care and sent to the Emergency Department via private vehicle with family . Per Dr Tracie Harrier, patient is in need of higher level of care due to dypnea, edema with CHF. Patient is aware and verbalizes understanding of plan of care.  Vitals:   11/03/23 1622  BP: (!) 162/104  Pulse: (!) 112  Resp: (!) 28  Temp: 97.7 F (36.5 C)  SpO2: 98%

## 2023-11-03 NOTE — ED Notes (Signed)
 Pt called, Pt's family stated she went home due to wait time. Pt stated she will try to come back

## 2023-11-03 NOTE — ED Provider Triage Note (Signed)
 Emergency Medicine Provider Triage Evaluation Note  ZANIA KALISZ , a 38 y.o. female  was evaluated in triage.  Pt complains of shortness of breath and bilateral leg swelling.  History of CHF has been out of Lasix for 2 months  Review of Systems  Positive: Bilateral leg edema, shortness of breath, orthopnea Negative: Fever, chills, chest pain, nausea, vomiting, diarrhea, abdominal pain  Physical Exam  There were no vitals taken for this visit. Gen:   Awake, no distress   Resp:  Normal effort, speaking in complete sentences MSK:   Moves extremities without difficulty  Other:  Bilateral edema in lower extremities  Medical Decision Making  Medically screening exam initiated at 5:16 PM.  Appropriate orders placed.  Ataya Murdy Mcelvain was informed that the remainder of the evaluation will be completed by another provider, this initial triage assessment does not replace that evaluation, and the importance of remaining in the ED until their evaluation is complete.  Labs and imaging ordered   Gretta Began 11/03/23 [id number]

## 2023-11-06 NOTE — ED Provider Notes (Signed)
 [facility]   [id number] 11/03/23 Arrival Time: 1557  ASSESSMENT & PLAN:  1. SOB (shortness of breath)   2. Acute on chronic congestive heart failure, unspecified heart failure type (HCC)   3. Type 2 diabetes mellitus with hyperglycemia, with long-term current use of insulin (HCC)    To ED for further evaluation; she is hesitant to go for fear of wait times. Daughter is with her and will take her via POV. Declines EMS transport.  Labs Reviewed  POCT FASTING CBG [facility] MANUAL ENTRY - Abnormal; Notable for the following components:      Result Value   POCT Glucose ([facility]) 379 (*)    All other components within normal limits   ECG: Performed today and interpreted by me: normal EKG, normal sinus rhythm. No STEMI.  Reviewed expectations re: course of current medical issues. Questions answered. Outlined signs and symptoms indicating need for more acute intervention. Patient verbalized understanding. After Visit Summary given.   SUBJECTIVE:  History from: patient. Cheryl Mercer is a 38 y.o. female with CHF, DM who presents with complaints of bilateral LE edema and dyspnea; gradual onset; x 2 weeks; legs now stay swollen; does have SOB at rest; has to sleep with head elevated. Denies CP at this time but questions episode of dull CP several days ago. Without assoc n/diaphoresis. "Been outta my meds for months". Is taking Insulin. Ambulatory here.    Social History   Tobacco Use  Smoking Status Never  Smokeless Tobacco Never   Social History   Substance and Sexual Activity  Alcohol Use No   Alcohol/week: 6.0 standard drinks of alcohol   Types: 6 Shots of liquor per week    OBJECTIVE:  Vitals:   11/03/23 1622  BP: (!) 162/104  Pulse: (!) 112  Resp: (!) 28  Temp: 97.7 F (36.5 C)  TempSrc: Oral  SpO2: 98%  Abnormal VS noted.  General appearance: appears fatigued Eyes: PERRLA; EOMI; conjunctivae normal HENT: normocephalic; atraumatic Neck: supple with  FROM Lungs: with labored respirations; speaks full sentences without difficulty;  bilateral rales at bases Heart: regular; tachycardic Abdomen: soft, non-tender; no guarding or rebound tenderness Extremities: with 2+ pitting edema Skin: warm and dry; without rash or lesions Neuro: normal gait Psychological: alert and cooperative; normal mood and affect  Labs: Results for orders placed or performed during the hospital encounter of 11/03/23  POC CBG monitoring   Collection Time: 11/03/23  4:35 PM  Result Value Ref Range   POCT Glucose ([facility]) 379 (A) 70 - 99 mg/dL   Labs Reviewed  POCT FASTING CBG [facility] MANUAL ENTRY - Abnormal; Notable for the following components:      Result Value   POCT Glucose ([facility]) 379 (*)    All other components within normal limits    Imaging: No results found.   Allergies  Allergen Reactions   Ibuprofen Anaphylaxis    Past Medical History:  Diagnosis Date   Chronic back pain    Chronic combined systolic and diastolic congestive heart failure (HCC) 09/07/2019   Diabetes mellitus without complication (HCC)    History of COVID-19 11/07/2019   Migraines    Peripheral neuropathy    Pregnancy induced hypertension    Pulmonary embolus (HCC) 12/16/2019   Pulmonary infarct (HCC) 12/17/2019   Social History   Socioeconomic History   Marital status: Married    Spouse name: Not on file   Number of children: 4   Years of education: Not on file   Highest  education level: Not on file  Occupational History   Not on file  Tobacco Use   Smoking status: Never   Smokeless tobacco: Never  Vaping Use   Vaping status: Some Days  Substance and Sexual Activity   Alcohol use: No    Alcohol/week: 6.0 standard drinks of alcohol    Types: 6 Shots of liquor per week   Drug use: Not Currently    Types: Marijuana   Sexual activity: Not Currently    Birth control/protection: Surgical    Comment: band placed around tubes in 2009 surgically by Dr. Emelda Fear  Other  Topics Concern   Not on file  Social History Narrative   Not on file   Social Drivers of Health   Financial Resource Strain: High Risk (02/24/2023)   Overall Financial Resource Strain (CARDIA)    Difficulty of Paying Living Expenses: Hard  Food Insecurity: Food Insecurity Present (02/24/2023)   Hunger Vital Sign    Worried About Running Out of Food in the Last Year: Sometimes true    Ran Out of Food in the Last Year: Sometimes true  Transportation Needs: No Transportation Needs (06/14/2022)   PRAPARE - Administrator, Civil Service (Medical): No    Lack of Transportation (Non-Medical): No  Physical Activity: Not on file  Stress: Not on file  Social Connections: Not on file  Intimate Partner Violence: Not At Risk (06/14/2022)   Humiliation, Afraid, Rape, and Kick questionnaire    Fear of Current or Ex-Partner: No    Emotionally Abused: No    Physically Abused: No    Sexually Abused: No   Family History  Problem Relation Age of Onset   Hypertension Mother    Anesthesia problems Neg Hx    Past Surgical History:  Procedure Laterality Date   CERVICAL BIOPSY  W/ LOOP ELECTRODE EXCISION  2009   Laparoscopic tubal sterilization with Falope rings.  2009   RIGHT/LEFT HEART CATH AND CORONARY ANGIOGRAPHY N/A 03/25/2019   Procedure: RIGHT/LEFT HEART CATH AND CORONARY ANGIOGRAPHY;  Surgeon: Dolores Patty, MD;  Location: [facility];  Service: Cardiovascular;  Laterality: N/A;   TUBAL LIGATION        Mardella Layman, MD 11/06/23 [phone]

## 2023-12-03 ENCOUNTER — Ambulatory Visit (INDEPENDENT_AMBULATORY_CARE_PROVIDER_SITE_OTHER): Admitting: General Practice

## 2023-12-03 ENCOUNTER — Encounter: Payer: Self-pay | Admitting: General Practice

## 2023-12-03 VITALS — BP 132/94 | HR 110 | Temp 98.1°F | Ht 59.0 in | Wt 146.0 lb

## 2023-12-03 DIAGNOSIS — E119 Type 2 diabetes mellitus without complications: Secondary | ICD-10-CM

## 2023-12-03 DIAGNOSIS — Z8679 Personal history of other diseases of the circulatory system: Secondary | ICD-10-CM

## 2023-12-03 DIAGNOSIS — Z7689 Persons encountering health services in other specified circumstances: Secondary | ICD-10-CM | POA: Diagnosis not present

## 2023-12-03 DIAGNOSIS — E785 Hyperlipidemia, unspecified: Secondary | ICD-10-CM

## 2023-12-03 DIAGNOSIS — I5023 Acute on chronic systolic (congestive) heart failure: Secondary | ICD-10-CM

## 2023-12-03 DIAGNOSIS — R55 Syncope and collapse: Secondary | ICD-10-CM | POA: Insufficient documentation

## 2023-12-03 DIAGNOSIS — Z794 Long term (current) use of insulin: Secondary | ICD-10-CM

## 2023-12-03 DIAGNOSIS — Z86711 Personal history of pulmonary embolism: Secondary | ICD-10-CM

## 2023-12-03 DIAGNOSIS — I10 Essential (primary) hypertension: Secondary | ICD-10-CM | POA: Diagnosis not present

## 2023-12-03 DIAGNOSIS — I5022 Chronic systolic (congestive) heart failure: Secondary | ICD-10-CM

## 2023-12-03 LAB — COMPREHENSIVE METABOLIC PANEL WITH GFR
ALT: 8 U/L (ref 0–35)
AST: 26 U/L (ref 0–37)
Albumin: 2.9 g/dL — ABNORMAL LOW (ref 3.5–5.2)
Alkaline Phosphatase: 184 U/L — ABNORMAL HIGH (ref 39–117)
BUN: 15 mg/dL (ref 6–23)
CO2: 33 meq/L — ABNORMAL HIGH (ref 19–32)
Calcium: 8.3 mg/dL — ABNORMAL LOW (ref 8.4–10.5)
Chloride: 93 meq/L — ABNORMAL LOW (ref 96–112)
Creatinine, Ser: 1 mg/dL (ref 0.40–1.20)
GFR: 72.48 mL/min (ref >60.00)
Glucose, Bld: 398 mg/dL — ABNORMAL HIGH (ref 70–99)
Potassium: 3.4 meq/L — ABNORMAL LOW (ref 3.5–5.1)
Sodium: 133 meq/L — ABNORMAL LOW (ref 135–145)
Total Bilirubin: 0.8 mg/dL (ref 0.2–1.2)
Total Protein: 6.5 g/dL (ref 6.0–8.3)

## 2023-12-03 LAB — CBC
HCT: 37.4 % (ref 36.0–46.0)
Hemoglobin: 12.6 g/dL (ref 12.0–15.0)
MCHC: 33.6 g/dL (ref 30.0–36.0)
MCV: 92.6 fl (ref 78.0–100.0)
Platelets: 358 10*3/uL (ref 150.0–400.0)
RBC: 4.04 Mil/uL (ref 3.87–5.11)
RDW: 14.4 % (ref 11.5–15.5)
WBC: 8.6 10*3/uL (ref 4.0–10.5)

## 2023-12-03 LAB — LIPID PANEL
Cholesterol: 189 mg/dL (ref 0–200)
HDL: 49.3 mg/dL (ref >39.00)
LDL Cholesterol: 112 mg/dL — ABNORMAL HIGH (ref 0–99)
NonHDL: 140
Total CHOL/HDL Ratio: 4
Triglycerides: 138 mg/dL (ref 0.0–149.0)
VLDL: 27.6 mg/dL (ref 0.0–40.0)

## 2023-12-03 LAB — HEMOGLOBIN A1C: Hgb A1c MFr Bld: 12.9 % — ABNORMAL HIGH (ref 4.6–6.5)

## 2023-12-03 LAB — TSH: TSH: 3.15 u[IU]/mL (ref 0.35–5.50)

## 2023-12-03 LAB — BRAIN NATRIURETIC PEPTIDE: Pro B Natriuretic peptide (BNP): 1145 pg/mL — ABNORMAL HIGH (ref 0.0–100.0)

## 2023-12-03 MED ORDER — SACUBITRIL-VALSARTAN 97-103 MG PO TABS: 1.0000 | ORAL_TABLET | Freq: Two times a day (BID) | ORAL | 1 refills | Status: DC

## 2023-12-03 MED ORDER — LANCET DEVICE MISC: 1.0000 | Freq: Three times a day (TID) | 0 refills | Status: AC

## 2023-12-03 MED ORDER — INSULIN GLARGINE 100 UNIT/ML SOLOSTAR PEN: 40.0000 [IU] | PEN_INJECTOR | Freq: Every day | SUBCUTANEOUS | 0 refills | Status: DC

## 2023-12-03 MED ORDER — INSULIN LISPRO (1 UNIT DIAL) 100 UNIT/ML (KWIKPEN): PEN_INJECTOR | SUBCUTANEOUS | 0 refills | Status: AC

## 2023-12-03 MED ORDER — FUROSEMIDE 20 MG PO TABS
20.0000 mg | ORAL_TABLET | Freq: Every day | ORAL | 0 refills | Status: DC
Start: 2023-12-03 — End: 2023-12-21

## 2023-12-03 MED ORDER — INSULIN PEN NEEDLE 31G X 8 MM MISC: 1.0000 | Freq: Three times a day (TID) | 0 refills | Status: AC

## 2023-12-03 MED ORDER — BLOOD PRESSURE MONITOR/ARM DEVI: 0 refills | Status: AC

## 2023-12-03 MED ORDER — BLOOD GLUCOSE MONITOR SYSTEM W/DEVICE KIT: 1.0000 | PACK | Freq: Three times a day (TID) | 0 refills | Status: AC

## 2023-12-03 MED ORDER — BLOOD GLUCOSE TEST VI STRP: 1.0000 | ORAL_STRIP | Freq: Three times a day (TID) | 0 refills | Status: AC

## 2023-12-03 MED ORDER — LANCETS MISC. MISC: 1.0000 | Freq: Three times a day (TID) | 0 refills | Status: AC

## 2023-12-03 NOTE — Assessment & Plan Note (Signed)
 EMR reviewed briefly.

## 2023-12-03 NOTE — Progress Notes (Signed)
 New Patient Office Visit  Subjective    Patient ID: Cheryl Mercer, female    DOB: 1986-07-24  Age: 38 y.o. MRN: [id number]  CC:  Chief Complaint  Patient presents with   New Patient (Initial Visit)   Leg Swelling    X 3 weeks; very painful. Patient states it feels like the skin is ripping. Legs are also red.     HPI Cheryl Mercer is a 38 y.o. female presents to establish care.  Last PCP/physical/labs: few years ago.   Bilateral leg swelling/CHF/PE/hyperlipidemia/dilated cardiomyopathy/hypertension/demand ischemia: symptoms onset for three weeks. Worse in the past week.  "Feels like the skin is ripping". Both legs feel really cold. She has tingling and constant dull pain. She does not work and does try to elevate her legs while sitting. She has tried over the counter leg cramp medication which did not provide any relief. She has been using a heat pad which has provided some relief.  She was evaluated at the [facility], ER on [address] then again on 11/03/2023 for bilateral leg swelling as well as shortness of breath.  Both of those times she left before seen by provider however she did have blood work done on 11/03/2023 which showed her BNP was 1882.2.  She was asked to follow-up with heart failure clinic she has not seen them since July 2024.  Upon reviewing the chart, her BNP was last checked previously on 03/06/2023 which was 382.8 and prior to that February 23, 2023 which was 1432.8.  She was evaluated at the heart failure clinic on 03/06/2023, and cardiologist stated that the echo in October 2023 EF was less than 20%, RV severely reduced.  Mild concentric LVH, LV with GHK, smoke seen in LAA, RA severely dilated, mild MR.  Echo in July 2024 shows EF less than 20%, RV moderately reduced.  Suspect CM 2/2 uncontrolled hypertension, EtOH, medication, noncompliance and DM.  She was asked to continue torsemide 40 mg daily, spironolactone 25 mg daily, Entresto 97-1 03 mg twice daily, Coreg 6.25  mg twice daily, digoxin 0.125 daily.  She was asked to repeat her echocardiogram and consider cardiac rehab.  She has a history of PE with pulmonary infarct and was treated with Eliquis for 6 months in April/21.  She has a history of hyperlipidemia and her goal setting was less than 70 her last lipid panel was checked in 2024 which was elevated.  She was asked to continue atorvastatin 40 mg once daily and have lipids rechecked in 6 to 8 weeks which she has not been able to do.  She was asked to follow-up in 3 weeks with their pharmacist, 6 weeks with their nurse practitioner in 12 weeks with the physician and have an echocardiogram however patient reports that she has not been able to follow-up with cardiology or any other providers in the meantime she has not identified any barriers just states that she was not able to go.  Today she is very concerned about her legs and the swelling which has been worsening over the last 3 weeks.  She reports that she has had chest pain intermittently and went to the ER earlier in the week but left before she was seen or evaluated.  She denies any chest pain, shortness of breath or difficulty breathing today.  She reports that she has been on Entresto after her discharge as they filled that for her at the hospital recently.  And would like a refill.  Type 2 DM-  diagnosed many years ago. Currently managed on insulin 40 units of lantus. At times ,she does divides it up into 2 20 unit of lantus. She is also on sliding scale for her meals and usually takes 3-4 units  of humalog twice daily.  Her last A1C was 13.5 in July 2024.  She checks her blood sugar at home when she is able to.  She is out of supplies at the moment and would like a refill.     Outpatient Encounter Medications as of 12/03/2023  Medication Sig   Blood Pressure Monitoring (BLOOD PRESSURE MONITOR/ARM) DEVI Use to check BP daily. Dx. I10   furosemide (LASIX) 20 MG tablet Take 1 tablet (20 mg total) by mouth  daily. Take two tablets twice daily for 5 days and then decrease to once daily.   Lancets Misc. MISC 1 each by Does not apply route in the morning, at noon, and at bedtime. May substitute to any manufacturer covered by patient's insurance.   Lancets MISC Use 3 (three) times daily. Use as directed to check blood sugar.   [DISCONTINUED] blood glucose meter kit and supplies KIT Dispense based on patient and insurance preference. Check your sugars prior to meals and at bedtime   [DISCONTINUED] Blood Glucose Monitoring Suppl (BLOOD GLUCOSE MONITOR SYSTEM) w/Device KIT Use 3 (three) times daily.   [DISCONTINUED] Glucose Blood (BLOOD GLUCOSE TEST STRIPS) STRP Use 3 (three) times daily. Use as directed to check blood sugar.   [DISCONTINUED] insulin glargine (LANTUS) 100 UNIT/ML Solostar Pen Inject 40 Units into the skin daily.   [DISCONTINUED] insulin lispro (HUMALOG KWIKPEN) 100 UNIT/ML KwikPen Inject 2-15 Units into the skin 3 (three) times daily after meals. CBG 121 - 150: 2 units, 151 - 200: 3 units, 201 - 250: 5 units, 251 - 300: 8 units, 301 - 350: 11 units, 351 - 400: 15u   [DISCONTINUED] Insulin Pen Needle 31G X 8 MM MISC Use to inject insulin as directed.   [DISCONTINUED] Lancet Device MISC Use 3 (three) times daily.   [DISCONTINUED] sacubitril-valsartan (ENTRESTO) 97-103 MG Take 1 tablet by mouth 2 (two) times daily.   Blood Glucose Monitoring Suppl (BLOOD GLUCOSE MONITOR SYSTEM) w/Device KIT Use 3 (three) times daily.   Glucose Blood (BLOOD GLUCOSE TEST STRIPS) STRP Use 3 (three) times daily. Use as directed to check blood sugar.   insulin glargine (LANTUS) 100 UNIT/ML Solostar Pen Inject 40 Units into the skin daily.   insulin lispro (HUMALOG KWIKPEN) 100 UNIT/ML KwikPen Inject 2-15 Units into the skin 3 (three) times daily after meals. CBG 121 - 150: 2 units, 151 - 200: 3 units, 201 - 250: 5 units, 251 - 300: 8 units, 301 - 350: 11 units, 351 - 400: 15u   Insulin Pen Needle 31G X 8 MM MISC Use to  inject insulin as directed.   Lancet Device MISC Use 3 (three) times daily.   sacubitril-valsartan (ENTRESTO) 97-103 MG Take 1 tablet by mouth 2 (two) times daily.   [DISCONTINUED] Insulin Pen Needle 33G X 5 MM MISC 1 Dose by Does not apply route 3 (three) times daily before meals. (Patient not taking: Reported on 12/03/2023)   No facility-administered encounter medications on file as of 12/03/2023.    Past Medical History:  Diagnosis Date   Arthritis    Chronic back pain    Chronic combined systolic and diastolic congestive heart failure (HCC) 09/07/2019   Depression    Diabetes mellitus without complication (HCC)    Fainting spell  History of COVID-19 11/07/2019   Migraines    Peripheral neuropathy    Pregnancy induced hypertension    Pulmonary embolus (HCC) 12/16/2019   Pulmonary infarct (HCC) 12/17/2019   Rhinovirus    Sepsis (HCC) 12/31/2021   Tachycardia    Viral pneumonia     Past Surgical History:  Procedure Laterality Date   CERVICAL BIOPSY  W/ LOOP ELECTRODE EXCISION  2009   Laparoscopic tubal sterilization with Falope rings.  2009   RIGHT/LEFT HEART CATH AND CORONARY ANGIOGRAPHY N/A 03/25/2019   Procedure: RIGHT/LEFT HEART CATH AND CORONARY ANGIOGRAPHY;  Surgeon: Mardell Shade, MD;  Location: [facility];  Service: Cardiovascular;  Laterality: N/A;   TUBAL LIGATION      Family History  Problem Relation Age of Onset   Stroke Mother    Miscarriages / [place] Mother    Depression Mother    Cancer Mother    Arthritis Mother    Hypertension Mother    Heart attack Mother    Mental illness Mother    Hypertension Father    Hyperlipidemia Father    Heart disease Father    Early death Father    Drug abuse Father    Depression Father    Alcohol abuse Father    Asthma Brother    Hypertension Maternal Grandmother    Heart attack Maternal Grandmother    Early death Maternal Grandmother    Diabetes Maternal Grandmother    COPD Maternal Grandmother     Cancer Maternal Grandmother    Arthritis Maternal Grandmother    Stroke Maternal Grandmother    Hypertension Maternal Grandfather    Hyperlipidemia Maternal Grandfather    Heart disease Maternal Grandfather    Heart attack Maternal Grandfather    Early death Maternal Grandfather    Diabetes Maternal Grandfather    COPD Maternal Grandfather    Arthritis Maternal Grandfather    Stroke Maternal Grandfather    Hypertension Paternal Grandmother    Hyperlipidemia Paternal Grandmother    Early death Paternal Grandmother    COPD Paternal Grandmother    Miscarriages / Stillbirths Paternal Grandmother    Stroke Paternal Grandmother    Kidney disease Paternal Grandfather    Hypertension Paternal Grandfather    Hyperlipidemia Paternal Grandfather    Heart attack Paternal Grandfather    Early death Paternal Grandfather    Anesthesia problems Neg Hx     Social History   Socioeconomic History   Marital status: Married    Spouse name: Not on file   Number of children: 4   Years of education: Not on file   Highest education level: High school graduate  Occupational History   Not on file  Tobacco Use   Smoking status: Former    Types: Cigarettes   Smokeless tobacco: Never  Vaping Use   Vaping status: Some Days  Substance and Sexual Activity   Alcohol use: Yes    Alcohol/week: 6.0 standard drinks of alcohol    Types: 6 Shots of liquor per week   Drug use: Not Currently    Types: Marijuana   Sexual activity: Yes    Birth control/protection: Surgical    Comment: band placed around tubes in 2009 surgically by Dr. Monty App  Other Topics Concern   Not on file  Social History Narrative   Not on file   Social Drivers of Health   Financial Resource Strain: High Risk (02/24/2023)   Overall Financial Resource Strain (CARDIA)    Difficulty of Paying Living Expenses:  Hard  Food Insecurity: Food Insecurity Present (02/24/2023)   Hunger Vital Sign    Worried About Running Out of Food in  the Last Year: Sometimes true    Ran Out of Food in the Last Year: Sometimes true  Transportation Needs: No Transportation Needs (06/14/2022)   PRAPARE - Administrator, Civil Service (Medical): No    Lack of Transportation (Non-Medical): No  Physical Activity: Not on file  Stress: Not on file  Social Connections: Not on file  Intimate Partner Violence: Not At Risk (06/14/2022)   Humiliation, Afraid, Rape, and Kick questionnaire    Fear of Current or Ex-Partner: No    Emotionally Abused: No    Physically Abused: No    Sexually Abused: No    Review of Systems  Constitutional:  Negative for chills and fever.  Respiratory:  Negative for shortness of breath.   Cardiovascular:  Positive for leg swelling. Negative for chest pain and palpitations.  Gastrointestinal:  Negative for abdominal pain, constipation, diarrhea, heartburn, nausea and vomiting.  Genitourinary:  Negative for dysuria, frequency and urgency.  Neurological:  Negative for dizziness and headaches.  Endo/Heme/Allergies:  Negative for polydipsia.  Psychiatric/Behavioral:  Negative for depression and suicidal ideas. The patient is not nervous/anxious.         Objective    BP (!) 132/94 (BP Location: Left Arm, Patient Position: Sitting, Cuff Size: Normal)   Pulse (!) 110   Temp 98.1 F (36.7 C) (Oral)   Ht 4\' 11"  (1.499 m)   Wt 146 lb (66.2 kg)   SpO2 97%   BMI 29.49 kg/m   Physical Exam Vitals and nursing note reviewed.  Constitutional:      Appearance: She is ill-appearing.  Cardiovascular:     Rate and Rhythm: Regular rhythm. Tachycardia present.     Pulses:          Dorsalis pedis pulses are 2+ on the right side and 1+ on the left side.       Posterior tibial pulses are 1+ on the right side and 1+ on the left side.     Heart sounds: Normal heart sounds.  Pulmonary:     Effort: Pulmonary effort is normal.     Breath sounds: Normal breath sounds.  Musculoskeletal:        General: Swelling  present.     Right lower leg: Edema present.     Left lower leg: Edema present.  Skin:    General: Skin is warm.          Comments: Firm leg swelling  Neurological:     Mental Status: She is alert and oriented to person, place, and time.  Psychiatric:        Mood and Affect: Mood normal.        Behavior: Behavior normal.        Thought Content: Thought content normal.        Judgment: Judgment normal.         Assessment & Plan:  Essential hypertension Assessment & Plan: Uncontrolled.  Patient is not aware which medications she is supposed to be on for blood pressure management. Will obtain most recent labs and restart the Entresto at this time.  Rx sent.  Urgent referral placed for cardiology follow-up. Upon reviewing chart and notes from March 06 2023 it was discovered that patient was supposed to be taking many more medications than just Entresto.  Will review labs and restart medications as deemed.  Orders: -  Blood Pressure Monitor/Arm; Use to check BP daily. Dx. [email or web address]  Dispense: 1 each; Refill: 0 -     CBC -     Comprehensive metabolic panel with GFR -     Lipid panel -     TSH  Establishing care with new doctor, encounter for Assessment & Plan: EMR reviewed briefly.     Diabetes mellitus type 2, insulin dependent (HCC) Assessment & Plan: Repeat hemoglobin A1c pending.  Reviewed labs from 11/03/2023.  Uncontrolled type 2 diabetes with insulin dependence.  Urgent referral placed for endocrinology.  Refill provided for glucometer and supplies.   Patient also provided with freestyle libre sample and booklet. Attempted to download the app and apply the sensor for the patient however patient did not have a cell phone that is working at this time.  Patient educated on how to apply the freestyle libre as well as how to download the app.  Refills provided to patient for Lantus 40 units daily and Humalog sliding scale.  Orders: -     Insulin Glargine; Inject 40  Units into the skin daily.  Dispense: 15 mL; Refill: 0 -     Insulin Lispro (1 Unit Dial); Inject 2-15 Units into the skin 3 (three) times daily after meals. CBG 121 - 150: 2 units, 151 - 200: 3 units, 201 - 250: 5 units, 251 - 300: 8 units, 301 - 350: 11 units, 351 - 400: 15u  Dispense: 15 mL; Refill: 0 -     Insulin Pen Needle; Use to inject insulin as directed.  Dispense: 100 each; Refill: 0 -     Ambulatory referral to Endocrinology -     Hemoglobin A1c -     Lipid panel -     TSH -     Lancets Misc.; 1 each by Does not apply route in the morning, at noon, and at bedtime. May substitute to any manufacturer covered by patient's insurance.  Dispense: 100 each; Refill: 0 -     Blood Glucose Monitor System; Use 3 (three) times daily.  Dispense: 1 kit; Refill: 0 -     Blood Glucose Test; Use 3 (three) times daily. Use as directed to check blood sugar.  Dispense: 100 strip; Refill: 0 -     Lancet Device; Use 3 (three) times daily.  Dispense: 1 each; Refill: 0  Acute on chronic systolic CHF (congestive heart failure) (HCC) Assessment & Plan: Uncontrolled.  Discussed at length with patient regarding appointment compliance as well as medication compliance.  Last echocardiogram showed EF less than 20%.  Patient advised that she will need a very close follow-up with cardiology and CHF clinic.  Strict ER precautions provided today.  Exam stable today.  Other than bilateral leg swelling.  Stat referral placed for CHF clinic, phone number for ride to patient to call make an appointment as she is already established there.  Rx sent for [facility].   CBC, CMP, lipid panel, BNP pending.  Orders: -     AMB referral to CHF clinic -     Furosemide; Take 1 tablet (20 mg total) by mouth daily. Take two tablets twice daily for 5 days and then decrease to once daily.  Dispense: 35 tablet; Refill: 0 -     Brain natriuretic peptide -     Sacubitril-Valsartan; Take 1 tablet by mouth 2 (two) times daily.   Dispense: 60 tablet; Refill: 1  Acute on chronic systolic congestive heart failure (HCC) -     AMB  referral to CHF clinic  Chronic systolic congestive heart failure (HCC) -     AMB referral to CHF clinic  History of pulmonary embolus (PE) Assessment & Plan: Diagnosed in April 2021.  Completed Eliquis.  No concerns today.   History of chronic CHF EF less than 20% Assessment & Plan: Uncontrolled. Discussed the importance of appointment compliance as well as medication adherence with patient at length. BNP, CMP, CBC pending. Refill sent for [facility] per patient request.  Urgent referral placed for cardiology.  Patient advised to please follow-up with heart failure clinic.  Strict ER precautions given to patient.  Follow-up in 1 week.   Dyslipidemia Assessment & Plan: Repeat lipid panel pending. Consider restarting atorvastatin 40 mg once daily.  Patient was not aware that she supposed to be taking this medication. Reviewed notes from July 2024 and was discovered that patient was started on atorvastatin a year ago however she is not sure how long she took it for.  Depending on labs we will restart atorvastatin or other therapy as deemed.  Await results.      Return in about 1 week (around 12/10/2023) for heart failure.   [name], NP

## 2023-12-03 NOTE — Assessment & Plan Note (Signed)
 Uncontrolled. Discussed the importance of appointment compliance as well as medication adherence with patient at length. BNP, CMP, CBC pending. Refill sent for [facility] per patient request.  Urgent referral placed for cardiology.  Patient advised to please follow-up with heart failure clinic.  Strict ER precautions given to patient.  Follow-up in 1 week.

## 2023-12-03 NOTE — Assessment & Plan Note (Signed)
 Diagnosed in April 2021.  Completed Eliquis.  No concerns today.

## 2023-12-03 NOTE — Assessment & Plan Note (Signed)
 Repeat hemoglobin A1c pending.  Reviewed labs from 11/03/2023.  Uncontrolled type 2 diabetes with insulin dependence.  Urgent referral placed for endocrinology.  Refill provided for glucometer and supplies.   Patient also provided with freestyle libre sample and booklet. Attempted to download the app and apply the sensor for the patient however patient did not have a cell phone that is working at this time.  Patient educated on how to apply the freestyle libre as well as how to download the app.  Refills provided to patient for Lantus 40 units daily and Humalog sliding scale.

## 2023-12-03 NOTE — Assessment & Plan Note (Signed)
 Repeat lipid panel pending. Consider restarting atorvastatin 40 mg once daily.  Patient was not aware that she supposed to be taking this medication. Reviewed notes from July 2024 and was discovered that patient was started on atorvastatin a year ago however she is not sure how long she took it for.  Depending on labs we will restart atorvastatin or other therapy as deemed.  Await results.

## 2023-12-03 NOTE — Assessment & Plan Note (Signed)
 Uncontrolled.  Patient is not aware which medications she is supposed to be on for blood pressure management. Will obtain most recent labs and restart the Entresto at this time.  Rx sent.  Urgent referral placed for cardiology follow-up. Upon reviewing chart and notes from March 06 2023 it was discovered that patient was supposed to be taking many more medications than just Entresto.  Will review labs and restart medications as deemed.

## 2023-12-03 NOTE — Patient Instructions (Addendum)
 Stop by the lab prior to leaving today. I will notify you of your results once received.   Start lasix 20 mg once in the morning and once in the evening for five days. Then decrease to one tablet a day.   Start monitoring your blood pressure daily, around the same time of day, for the next 2-3 weeks.  Ensure that you have rested for 30 minutes prior to checking your blood pressure.   Record your readings and notify me if you see numbers consistently at or above 130 on top and/or 90 on bottom.  Start checking your blood sugar levels.  Appropriate times to check your blood sugar levels are:  -Before any meal (breakfast, lunch, dinner) -Two hours after any meal (breakfast, lunch, dinner) -Bedtime  Record your readings and notify me if you continue to consistently run at or above 150.   Call and schedule appt with cardiology today.  [phone].    Please go to the ER if your leg swelling worsens, chest pain, shortness of breath or difficulty breathing.   Follow up in one week.  It was a pleasure to meet you today! Please don't hesitate to contact me with any questions. Welcome to [facility]!

## 2023-12-03 NOTE — Assessment & Plan Note (Signed)
 Uncontrolled.  Discussed at length with patient regarding appointment compliance as well as medication compliance.  Last echocardiogram showed EF less than 20%.  Patient advised that she will need a very close follow-up with cardiology and CHF clinic.  Strict ER precautions provided today.  Exam stable today.  Other than bilateral leg swelling.  Stat referral placed for CHF clinic, phone number for ride to patient to call make an appointment as she is already established there.  Rx sent for [facility].   CBC, CMP, lipid panel, BNP pending.

## 2023-12-18 ENCOUNTER — Telehealth (HOSPITAL_COMMUNITY): Payer: Self-pay | Admitting: Internal Medicine

## 2023-12-18 NOTE — Telephone Encounter (Signed)
 Called to confirm/remind patient of their appointment at [facility] on 12/18/2023.   Appointment:   [x] Confirmed  [] Left mess   [] No answer/No voice mail  [] VM Full/unable to leave message  [] Phone not in service  Patient reminded to bring all medications and/or complete list.  Confirmed patient has transportation. Gave directions, instructed to utilize valet parking.

## 2023-12-21 ENCOUNTER — Ambulatory Visit (HOSPITAL_COMMUNITY)
Admission: RE | Admit: 2023-12-21 | Discharge: 2023-12-21 | Disposition: A | Discharge status: Home / Self Care | Source: Ambulatory Visit | Attending: Internal Medicine | Admitting: Internal Medicine

## 2023-12-21 ENCOUNTER — Ambulatory Visit (INDEPENDENT_AMBULATORY_CARE_PROVIDER_SITE_OTHER)

## 2023-12-21 ENCOUNTER — Encounter (HOSPITAL_COMMUNITY): Payer: Self-pay

## 2023-12-21 ENCOUNTER — Encounter (HOSPITAL_COMMUNITY): Payer: Self-pay | Admitting: Internal Medicine

## 2023-12-21 VITALS — BP 160/100 | Ht 59.0 in | Wt 144.0 lb

## 2023-12-21 VITALS — BP 160/100 | HR 100 | Wt 144.6 lb

## 2023-12-21 DIAGNOSIS — Z86711 Personal history of pulmonary embolism: Secondary | ICD-10-CM

## 2023-12-21 DIAGNOSIS — E1142 Type 2 diabetes mellitus with diabetic polyneuropathy: Secondary | ICD-10-CM | POA: Insufficient documentation

## 2023-12-21 DIAGNOSIS — I110 Hypertensive heart disease with heart failure: Secondary | ICD-10-CM | POA: Insufficient documentation

## 2023-12-21 DIAGNOSIS — Z0000 Encounter for general adult medical examination without abnormal findings: Secondary | ICD-10-CM

## 2023-12-21 DIAGNOSIS — I5022 Chronic systolic (congestive) heart failure: Secondary | ICD-10-CM | POA: Insufficient documentation

## 2023-12-21 DIAGNOSIS — F1011 Alcohol abuse, in remission: Secondary | ICD-10-CM

## 2023-12-21 DIAGNOSIS — Z79899 Other long term (current) drug therapy: Secondary | ICD-10-CM | POA: Insufficient documentation

## 2023-12-21 DIAGNOSIS — I10 Essential (primary) hypertension: Secondary | ICD-10-CM

## 2023-12-21 DIAGNOSIS — Z5986 Financial insecurity: Secondary | ICD-10-CM | POA: Insufficient documentation

## 2023-12-21 DIAGNOSIS — I5023 Acute on chronic systolic (congestive) heart failure: Secondary | ICD-10-CM

## 2023-12-21 DIAGNOSIS — E1165 Type 2 diabetes mellitus with hyperglycemia: Secondary | ICD-10-CM

## 2023-12-21 DIAGNOSIS — E785 Hyperlipidemia, unspecified: Secondary | ICD-10-CM | POA: Insufficient documentation

## 2023-12-21 DIAGNOSIS — I5082 Biventricular heart failure: Secondary | ICD-10-CM | POA: Insufficient documentation

## 2023-12-21 DIAGNOSIS — Z91148 Patient's other noncompliance with medication regimen for other reason: Secondary | ICD-10-CM | POA: Insufficient documentation

## 2023-12-21 DIAGNOSIS — Z794 Long term (current) use of insulin: Secondary | ICD-10-CM | POA: Insufficient documentation

## 2023-12-21 MED ORDER — SACUBITRIL-VALSARTAN 97-103 MG PO TABS: 1.0000 | ORAL_TABLET | Freq: Two times a day (BID) | ORAL | 1 refills | Status: DC

## 2023-12-21 MED ORDER — FUROSEMIDE 40 MG PO TABS: 40.0000 mg | ORAL_TABLET | Freq: Every day | ORAL | 3 refills | Status: DC

## 2023-12-21 NOTE — H&P (Incomplete)
 Advanced Heart Failure Team History and Physical Note   PCP:  [name], NP  PCP-Cardiology: Dr. Mitzie Anda   Reason for Admission: Direct admit for a/c systolic heart failure    HPI:    [name] is a 38 y.o.  hispanic female with chronic systolic heart failure, h/o PE, migraines, hypertension and poorly controlled type 2 diabetes mellitus. She has previously no showed to multiple AHF clinic visits. Last seen in AHF clinic 5/23. Has been admitted several time with CHF exacerbations 2/2 med noncompliance.    Admitted 7/24 with a/c HF. Echo showed EF < 20%. RV severely reduced. Mild concentric LVH, LV with GHK, smoke seen in LA, RA severely dilated, mild MR. Felt to be 2/2 to ETOH, HTN and medication noncompliance. GDMT titrated and she was discharged home.   Seen yesterday in clinic. We had not seen her in almost a year. Only taking lasix  and Entresto . But not taking Entresto  regularly either. Has been out of other meds for about a year. SOB with any activity. Occasioanl CP. Marked LE edema. Occasional orthostasis. Denied tobacco use. Drinks "a couple of cups of mixed drinks per week"  Sugars running in 400 range.   She is now being direct admitted for HF optimization w/ IV diuretics, resumption of oral GDMT regimen and control of blood glucose levels.   Cardiac studies   - Echo (7/24): EF < 20% - Echo 10/23 EF <20%, RV severely reduced. Mild concentric LVH, LV with GHK, smoke seen in LA, RA severely dilated, mild MR - Echo 5/23 EF 25-30%, GIIIDD, RV mod reduced, LA/RA mildly dilated - Echo 10/22 EF 30-35%, normal RV - Echo (2021): EF at 10-15% and moderately reduced RV - RHC/LHC 03/25/19 showed no CAD. Well compensated hemodynamics with low filling pressures.  - cMRI (8/20): w/ EF at 15% and no significant LGE    Home Medications Prior to Admission medications   Medication Sig Start Date End Date Taking? Authorizing Provider  Blood Glucose Monitoring Suppl (BLOOD GLUCOSE  MONITOR SYSTEM) w/Device KIT Use 3 (three) times daily. 12/03/23   [name], NP  Blood Pressure Monitoring (BLOOD PRESSURE MONITOR/ARM) DEVI Use to check BP daily. Dx. [email or web address] Patient not taking: Reported on 12/21/2023 12/03/23   [name], NP  furosemide  (LASIX ) 40 MG tablet Take 1 tablet (40 mg total) by mouth daily. 12/21/23   Bensimhon, Rheta Celestine, MD  Glucose Blood (BLOOD GLUCOSE TEST STRIPS) STRP Use 3 (three) times daily. Use as directed to check blood sugar. 12/03/23   [name], NP  insulin  glargine (LANTUS ) 100 UNIT/ML Solostar Pen Inject 40 Units into the skin daily. 12/03/23   [name], NP  insulin  lispro (HUMALOG  KWIKPEN) 100 UNIT/ML KwikPen Inject 2-15 Units into the skin 3 (three) times daily after meals. CBG 121 - 150: 2 units, 151 - 200: 3 units, 201 - 250: 5 units, 251 - 300: 8 units, 301 - 350: 11 units, 351 - 400: 15u 12/03/23   [name], NP  Insulin  Pen Needle 31G X 8 MM MISC Use to inject insulin  as directed. 12/03/23   [name], NP  Lancet Device MISC Use 3 (three) times daily. 12/03/23   [name], NP  Lancets Misc. MISC 1 each by Does not apply route in the morning, at noon, and at bedtime. May substitute to any manufacturer covered by patient's insurance. 12/03/23 01/02/24  [name], NP  Lancets MISC Use 3 (three) times daily. Use as directed to check blood sugar. 02/26/23  Daren Eck, DO  sacubitril -valsartan  (ENTRESTO ) 97-103 MG Take 1 tablet by mouth 2 (two) times daily. 12/21/23   Bensimhon, Rheta Celestine, MD    Past Medical History: Past Medical History:  Diagnosis Date   Arthritis    Chronic back pain    Chronic combined systolic and diastolic congestive heart failure (HCC) 09/07/2019   Depression    Diabetes mellitus without complication (HCC)    Fainting spell    History of COVID-19 11/07/2019   Migraines    Peripheral neuropathy    Pregnancy induced hypertension    Pulmonary embolus (HCC) 12/16/2019   Pulmonary infarct (HCC) 12/17/2019    Rhinovirus    Sepsis (HCC) 12/31/2021   Tachycardia    Viral pneumonia     Past Surgical History: Past Surgical History:  Procedure Laterality Date   CERVICAL BIOPSY  W/ LOOP ELECTRODE EXCISION  2009   Laparoscopic tubal sterilization with Falope rings.  2009   RIGHT/LEFT HEART CATH AND CORONARY ANGIOGRAPHY N/A 03/25/2019   Procedure: RIGHT/LEFT HEART CATH AND CORONARY ANGIOGRAPHY;  Surgeon: Mardell Shade, MD;  Location: [facility];  Service: Cardiovascular;  Laterality: N/A;   TUBAL LIGATION      Family History: *** Family History  Problem Relation Age of Onset   Stroke Mother    Miscarriages / [place] Mother    Depression Mother    Cancer Mother    Arthritis Mother    Hypertension Mother    Heart attack Mother    Mental illness Mother    Hypertension Father    Hyperlipidemia Father    Heart disease Father    Early death Father    Drug abuse Father    Depression Father    Alcohol abuse Father    Asthma Brother    Hypertension Maternal Grandmother    Heart attack Maternal Grandmother    Early death Maternal Grandmother    Diabetes Maternal Grandmother    COPD Maternal Grandmother    Cancer Maternal Grandmother    Arthritis Maternal Grandmother    Stroke Maternal Grandmother    Hypertension Maternal Grandfather    Hyperlipidemia Maternal Grandfather    Heart disease Maternal Grandfather    Heart attack Maternal Grandfather    Early death Maternal Grandfather    Diabetes Maternal Grandfather    COPD Maternal Grandfather    Arthritis Maternal Grandfather    Stroke Maternal Grandfather    Hypertension Paternal Grandmother    Hyperlipidemia Paternal Grandmother    Early death Paternal Grandmother    COPD Paternal Grandmother    Miscarriages / Stillbirths Paternal Grandmother    Stroke Paternal Grandmother    Kidney disease Paternal Grandfather    Hypertension Paternal Grandfather    Hyperlipidemia Paternal Grandfather    Heart attack Paternal  Grandfather    Early death Paternal Grandfather    Anesthesia problems Neg Hx     Social History: Social History   Socioeconomic History   Marital status: Married    Spouse name: Not on file   Number of children: 4   Years of education: Not on file   Highest education level: High school graduate  Occupational History   Not on file  Tobacco Use   Smoking status: Former    Types: Cigarettes   Smokeless tobacco: Never  Vaping Use   Vaping status: Some Days  Substance and Sexual Activity   Alcohol use: Yes    Alcohol/week: 6.0 standard drinks of alcohol    Types: 6 Shots of  liquor per week   Drug use: Not Currently    Types: Marijuana   Sexual activity: Yes    Birth control/protection: Surgical    Comment: band placed around tubes in 2009 surgically by Dr. Monty App  Other Topics Concern   Not on file  Social History Narrative   Not on file   Social Drivers of Health   Financial Resource Strain: High Risk (12/21/2023)   Overall Financial Resource Strain (CARDIA)    Difficulty of Paying Living Expenses: Hard  Food Insecurity: Food Insecurity Present (12/21/2023)   Hunger Vital Sign    Worried About Running Out of Food in the Last Year: Sometimes true    Ran Out of Food in the Last Year: Sometimes true  Transportation Needs: No Transportation Needs (12/21/2023)   PRAPARE - Administrator, Civil Service (Medical): No    Lack of Transportation (Non-Medical): No  Physical Activity: Insufficiently Active (12/21/2023)   Exercise Vital Sign    Days of Exercise per Week: 7 days    Minutes of Exercise per Session: 20 min  Stress: No Stress Concern Present (12/21/2023)   [employer] of Occupational Health - Occupational Stress Questionnaire    Feeling of Stress : Not at all  Social Connections: Unknown (12/21/2023)   Social Connection and Isolation Panel [NHANES]    Frequency of Communication with Friends and Family: More than three times a week    Frequency of Social  Gatherings with Friends and Family: More than three times a week    Attends Religious Services: More than 4 times per year    Active Member of [employer] or Organizations: Yes    Attends Banker Meetings: More than 4 times per year    Marital Status: Patient declined    Allergies:  Allergies  Allergen Reactions   Ibuprofen  Anaphylaxis    Objective:    Vital Signs:   BP: ()/()  Arterial Line BP: ()/()    There were no vitals filed for this visit.   Physical Exam     General:  Well appearing. No respiratory difficulty HEENT: Normal Neck: Supple. no JVD. Carotids 2+ bilat; no bruits. No lymphadenopathy or thyromegaly appreciated. Cor: PMI nondisplaced. Regular rate & rhythm. No rubs, gallops or murmurs. Lungs: Clear Abdomen: Soft, nontender, nondistended. No hepatosplenomegaly. No bruits or masses. Good bowel sounds. Extremities: No cyanosis, clubbing, rash, edema Neuro: Alert & oriented x 3, cranial nerves grossly intact. moves all 4 extremities w/o difficulty. Affect pleasant.   Telemetry   ***  EKG   ***  Labs     Basic Metabolic Panel: No results for input(s): "NA", "K", "CL", "CO2", "GLUCOSE", "BUN", "CREATININE", "CALCIUM ", "MG", "PHOS" in the last 168 hours.  Liver Function Tests: No results for input(s): "AST", "ALT", "ALKPHOS", "BILITOT", "PROT", "ALBUMIN" in the last 168 hours. No results for input(s): "LIPASE", "AMYLASE" in the last 168 hours. No results for input(s): "AMMONIA" in the last 168 hours.  CBC: No results for input(s): "WBC", "NEUTROABS", "HGB", "HCT", "MCV", "PLT" in the last 168 hours.  Cardiac Enzymes: No results for input(s): "CKTOTAL", "CKMB", "CKMBINDEX", "TROPONINI" in the last 168 hours.  BNP: BNP (last 3 results) Recent Labs    02/23/23 0239 03/06/23 0916 11/03/23 1718  BNP 1,432.8* 382.8* 1,882.2*    ProBNP (last 3 results) Recent Labs    12/03/23 1235  PROBNP 1,145.0*     CBG: No results for input(s):  "GLUCAP" in the last 168 hours.  Coagulation Studies: No results  for input(s): "LABPROT", "INR" in the last 72 hours.  Imaging: No results found.   Patient Profile    Assessment/Plan   1. Acute on Chronic biventricular systolic heart failure - Echo (10/23): EF <20%, RV severely reduced. Mild concentric LVH, LV with GHK, smoke seen in LA, RA severely dilated, mild MR - Echo (7/24): EF < 20%, RV moderately reduced  - Suspect CM 2/2 uncontrolled hypertension, ETOH, medication noncompliance and DM - now admitted w/ NYHA Class IIIb symptoms + marked volume overload, in setting of poor med compliance - start IV Lasix   - restart GDMT regimen as renal fx permits. Check BMP     - Continue torsemide  40 mg daily. - Continue spironolactone  25 mg daily  - Continue Entresto  97-103 mg bid - Continue Coreg  6.25 mg bid - Continue digoxin  0.125 daily - Off SGLT2i, A1c 13.5   2. HTN - elevated in setting of poor compliance    3. DM II, Poorly Controlled  - Off SGLT2i with A1c 13.5 -> 12.9 - Has new PCP appt today.    4. H/o PE w/ pulmonary infarct - treated with eliquis  x 6 months 4/21   5. Alcohol use - NO further ETOH use since discharge. - Congratulated   6. HLD - Recent LP (4/25) LDL 112.  Goal in setting of DM < 70  - Continue atorva 40 mg (recently restarted)  - repeat LFTs and Lipids in 6-8 weeks   Ruddy Corral, PA-C 12/21/2023, 4:40 PM Total Time Spent **** Advanced Heart Failure Team Pager [phone] (M-F; 7a - 5p)  Please contact [facility] for night-coverage after hours (4p -7a ) and weekends on [email or web address]

## 2023-12-21 NOTE — Progress Notes (Incomplete)
 ADVANCED HF CLINIC NOTE   Primary Care: [name], NP Primary Cardiologist: Dr. Julane Ny  Chief complaint: HF  HPI: Cheryl Mercer is a 38 y.o.  hispanic female with chronic systolic heart failure, h/o PE, migraines, hypertension and poorly controlled type 2 diabetes mellitus. She has previously no showed to multiple AHF clinic visits. Last seen in AHF clinic 5/23. Has been admitted several time with CHF exacerbations 2/2 med noncompliance.    Admitted 7/24 with a/c HF. Echo showed EF < 20%. RV severely reduced. Mild concentric LVH, LV with GHK, smoke seen in LA, RA severely dilated, mild MR. Felt to be 2/2 to ETOH, HTN and medication noncompliance. GDMT titrated and she was discharged home.   Today she returns for HF follow up.   We have not seen her in almost a year. Now only taking lasix  and Entresto . But not taking Entresto  regularly either. Has been out of other meds for about a year. SOB with any activity. Occasioanl CP. Marked LE edema. Occasional orthostasis. Denies tobacco use. Drinks "a couple of cups of mixed drinks per week"  Sugars running in 400 range.        Cardiac studies  - Echo (7/24): EF < 20% - Echo 10/23 EF <20%, RV severely reduced. Mild concentric LVH, LV with GHK, smoke seen in LA, RA severely dilated, mild MR - Echo 5/23 EF 25-30%, GIIIDD, RV mod reduced, LA/RA mildly dilated - Echo 10/22 EF 30-35%, normal RV - Echo (2021): EF at 10-15% and moderately reduced RV - RHC/LHC 03/25/19 showed no CAD. Well compensated hemodynamics with low filling pressures.  - cMRI (8/20): w/ EF at 15% and no significant LGE   Past Medical History:  Diagnosis Date   Arthritis    Chronic back pain    Chronic combined systolic and diastolic congestive heart failure (HCC) 09/07/2019   Depression    Diabetes mellitus without complication (HCC)    Fainting spell    History of COVID-19 11/07/2019   Migraines    Peripheral neuropathy    Pregnancy induced hypertension     Pulmonary embolus (HCC) 12/16/2019   Pulmonary infarct (HCC) 12/17/2019   Rhinovirus    Sepsis (HCC) 12/31/2021   Tachycardia    Viral pneumonia    Current Outpatient Medications  Medication Sig Dispense Refill   Blood Glucose Monitoring Suppl (BLOOD GLUCOSE MONITOR SYSTEM) w/Device KIT Use 3 (three) times daily. 1 kit 0   furosemide  (LASIX ) 20 MG tablet Take 20 mg by mouth daily.     Glucose Blood (BLOOD GLUCOSE TEST STRIPS) STRP Use 3 (three) times daily. Use as directed to check blood sugar. 100 strip 0   insulin  glargine (LANTUS ) 100 UNIT/ML Solostar Pen Inject 40 Units into the skin daily. 15 mL 0   insulin  lispro (HUMALOG  KWIKPEN) 100 UNIT/ML KwikPen Inject 2-15 Units into the skin 3 (three) times daily after meals. CBG 121 - 150: 2 units, 151 - 200: 3 units, 201 - 250: 5 units, 251 - 300: 8 units, 301 - 350: 11 units, 351 - 400: 15u 15 mL 0   Insulin  Pen Needle 31G X 8 MM MISC Use to inject insulin  as directed. 100 each 0   Lancet Device MISC Use 3 (three) times daily. 1 each 0   Lancets Misc. MISC 1 each by Does not apply route in the morning, at noon, and at bedtime. May substitute to any manufacturer covered by patient's insurance. 100 each 0   Lancets MISC Use 3 (three) times  daily. Use as directed to check blood sugar. 100 each 0   sacubitril -valsartan  (ENTRESTO ) 97-103 MG Take 1 tablet by mouth 2 (two) times daily. 60 tablet 1   Blood Pressure Monitoring (BLOOD PRESSURE MONITOR/ARM) DEVI Use to check BP daily. Dx. Cheryl Mercer (Patient not taking: Reported on 12/21/2023) 1 each 0   No current facility-administered medications for this encounter.   Allergies  Allergen Reactions   Ibuprofen  Anaphylaxis   Social History   Socioeconomic History   Marital status: Married    Spouse name: Not on file   Number of children: 4   Years of education: Not on file   Highest education level: High school graduate  Occupational History   Not on file  Tobacco Use   Smoking status: Former     Types: Cigarettes   Smokeless tobacco: Never  Vaping Use   Vaping status: Some Days  Substance and Sexual Activity   Alcohol use: Yes    Alcohol/week: 6.0 standard drinks of alcohol    Types: 6 Shots of liquor per week   Drug use: Not Currently    Types: Marijuana   Sexual activity: Yes    Birth control/protection: Surgical    Comment: band placed around tubes in 2009 surgically by Dr. Monty App  Other Topics Concern   Not on file  Social History Narrative   Not on file   Social Drivers of Health   Financial Resource Strain: High Risk (02/24/2023)   Overall Financial Resource Strain (CARDIA)    Difficulty of Paying Living Expenses: Hard  Food Insecurity: Food Insecurity Present (02/24/2023)   Hunger Vital Sign    Worried About Running Out of Food in the Last Year: Sometimes true    Ran Out of Food in the Last Year: Sometimes true  Transportation Needs: No Transportation Needs (06/14/2022)   PRAPARE - Administrator, Civil Service (Medical): No    Lack of Transportation (Non-Medical): No  Physical Activity: Not on file  Stress: Not on file  Social Connections: Not on file  Intimate Partner Violence: Not At Risk (06/14/2022)   Humiliation, Afraid, Rape, and Kick questionnaire    Fear of Current or Ex-Partner: No    Emotionally Abused: No    Physically Abused: No    Sexually Abused: No   Family History  Problem Relation Age of Onset   Stroke Mother    Miscarriages / [place] Mother    Depression Mother    Cancer Mother    Arthritis Mother    Hypertension Mother    Heart attack Mother    Mental illness Mother    Hypertension Father    Hyperlipidemia Father    Heart disease Father    Early death Father    Drug abuse Father    Depression Father    Alcohol abuse Father    Asthma Brother    Hypertension Maternal Grandmother    Heart attack Maternal Grandmother    Early death Maternal Grandmother    Diabetes Maternal Grandmother    COPD Maternal  Grandmother    Cancer Maternal Grandmother    Arthritis Maternal Grandmother    Stroke Maternal Grandmother    Hypertension Maternal Grandfather    Hyperlipidemia Maternal Grandfather    Heart disease Maternal Grandfather    Heart attack Maternal Grandfather    Early death Maternal Grandfather    Diabetes Maternal Grandfather    COPD Maternal Grandfather    Arthritis Maternal Grandfather    Stroke Maternal Grandfather  Hypertension Paternal Grandmother    Hyperlipidemia Paternal Grandmother    Early death Paternal Grandmother    COPD Paternal Grandmother    Miscarriages / Stillbirths Paternal Grandmother    Stroke Paternal Grandmother    Kidney disease Paternal Grandfather    Hypertension Paternal Grandfather    Hyperlipidemia Paternal Grandfather    Heart attack Paternal Grandfather    Early death Paternal Grandfather    Anesthesia problems Neg Hx    BP (!) 160/100   Pulse 100   Wt 65.6 kg (144 lb 9.6 oz)   SpO2 97%   BMI 29.21 kg/m   Wt Readings from Last 3 Encounters:  12/21/23 65.6 kg (144 lb 9.6 oz)  12/03/23 66.2 kg (146 lb)  03/06/23 62.2 kg (137 lb 3.2 oz)   PHYSICAL EXAM: General:  NAD. No resp difficulty, walked into clinic HEENT: Normal Neck: Supple. No JVD. Carotids 2+ bilat; no bruits. No lymphadenopathy or thryomegaly appreciated. Cor: PMI nondisplaced. Regular rate & rhythm. No rubs, gallops or murmurs. Lungs: Clear Abdomen: Soft, nontender, nondistended. No hepatosplenomegaly. No bruits or masses. Good bowel sounds. Extremities: No cyanosis, clubbing, rash, edema Neuro: Alert & oriented x 3, cranial nerves grossly intact. Moves all 4 extremities w/o difficulty. Affect pleasant.  ECG (personally reviewed): ST 106 bpm  ASSESSMENT & PLAN: Chronic biventricular systolic heart failure - Echo (10/23): EF <20%, RV severely reduced. Mild concentric LVH, LV with GHK, smoke seen in LA, RA severely dilated, mild MR - Echo (7/24): EF < 20%, RV moderately  reduced  - Suspect CM 2/2 uncontrolled hypertension, ETOH, medication noncompliance and DM - NYHA IIb-III, volume OK today.  - Continue torsemide  40 mg daily. - Continue spironolactone  25 mg daily  - Continue Entresto  97-103 mg bid - Continue Coreg  6.25 mg bid - Continue digoxin  0.125 daily - Off SGLT2i, A1c 13.5   2. HTN - BP improved, no med changes today as she has not taken her AM med yet - Suspect she would not be compliant with TID hydral.   3. DM II - A1C 13.5 - Has new PCP appt today. - Off SGLT2i with A1c 13.5 -> 12.9   4. H/o PE w/ pulmonary infarct - treated with eliquis  x 6 months 4/21   5. Alcohol use - NO further ETOH use since discharge. - Congratulated.   6. HLD - LDL elevated 125, Goal in setting of DM < 70  - Continue atorva 40 mg (recently restarted)  - repeat LFTs and Lipids in 6-8 weeks   Jules Oar, MD  12:28 PM

## 2023-12-21 NOTE — Progress Notes (Signed)
 Because this visit was a virtual/telehealth visit,  certain criteria was not obtained, such a blood pressure, CBG if applicable, and timed get up and go. Any medications not marked as "taking" were not mentioned during the medication reconciliation part of the visit. Any vitals not documented were not able to be obtained due to this being a telehealth visit or patient was unable to self-report a recent blood pressure reading due to a lack of equipment at home via telehealth. Vitals that have been documented are verbally provided by the patient.  Subjective:   Cheryl Mercer is a 38 y.o. who presents for a Medicare Wellness preventive visit.  Visit Complete: Virtual I connected with  Cheryl Mercer on 12/21/23 by a video and audio enabled telemedicine application and verified that I am speaking with the correct person using two identifiers.  Patient Location: Home  Provider Location: Home Office  I discussed the limitations of evaluation and management by telemedicine. The patient expressed understanding and agreed to proceed.  Vital Signs: Because this visit was a virtual/telehealth visit, some criteria may be missing or patient reported. Any vitals not documented were not able to be obtained and vitals that have been documented are patient reported.  VideoDeclined- This patient declined Librarian, academic. Therefore the visit was completed with audio only.  Persons Participating in Visit: Patient.  AWV Questionnaire: No: Patient Medicare AWV questionnaire was not completed prior to this visit.  Cardiac Risk Factors include: advanced age (>60men, >32 women);sedentary lifestyle;diabetes mellitus;hypertension     Objective:    Today's Vitals   12/21/23 1514  BP: (!) 160/100  Weight: 144 lb (65.3 kg)  Height: 4\' 11"  (1.499 m)  PainSc: 7    Body mass index is 29.08 kg/m.     12/21/2023    3:13 PM 10/26/2023   11:33 AM 02/23/2023    2:35 AM  02/12/2023   11:49 PM 09/09/2022    6:11 PM 06/12/2022    5:02 PM 06/12/2022    6:17 AM  Advanced Directives  Does Patient Have a Medical Advance Directive? No No No No No No No  Would patient like information on creating a medical advance directive? No - Patient declined     No - Patient declined No - Patient declined    Current Medications (verified) Outpatient Encounter Medications as of 12/21/2023  Medication Sig   Blood Glucose Monitoring Suppl (BLOOD GLUCOSE MONITOR SYSTEM) w/Device KIT Use 3 (three) times daily.   furosemide  (LASIX ) 40 MG tablet Take 1 tablet (40 mg total) by mouth daily.   Glucose Blood (BLOOD GLUCOSE TEST STRIPS) STRP Use 3 (three) times daily. Use as directed to check blood sugar.   insulin  glargine (LANTUS ) 100 UNIT/ML Solostar Pen Inject 40 Units into the skin daily.   insulin  lispro (HUMALOG  KWIKPEN) 100 UNIT/ML KwikPen Inject 2-15 Units into the skin 3 (three) times daily after meals. CBG 121 - 150: 2 units, 151 - 200: 3 units, 201 - 250: 5 units, 251 - 300: 8 units, 301 - 350: 11 units, 351 - 400: 15u   Insulin  Pen Needle 31G X 8 MM MISC Use to inject insulin  as directed.   Lancet Device MISC Use 3 (three) times daily.   Lancets Misc. MISC 1 each by Does not apply route in the morning, at noon, and at bedtime. May substitute to any manufacturer covered by patient's insurance.   Lancets MISC Use 3 (three) times daily. Use as directed to check blood sugar.  sacubitril -valsartan  (ENTRESTO ) 97-103 MG Take 1 tablet by mouth 2 (two) times daily.   Blood Pressure Monitoring (BLOOD PRESSURE MONITOR/ARM) DEVI Use to check BP daily. Dx. I10 (Patient not taking: Reported on 12/21/2023)   No facility-administered encounter medications on file as of 12/21/2023.    Allergies (verified) Ibuprofen    History: Past Medical History:  Diagnosis Date   Arthritis    Chronic back pain    Chronic combined systolic and diastolic congestive heart failure (HCC) 09/07/2019    Depression    Diabetes mellitus without complication (HCC)    Fainting spell    History of COVID-19 11/07/2019   Migraines    Peripheral neuropathy    Pregnancy induced hypertension    Pulmonary embolus (HCC) 12/16/2019   Pulmonary infarct (HCC) 12/17/2019   Rhinovirus    Sepsis (HCC) 12/31/2021   Tachycardia    Viral pneumonia    Past Surgical History:  Procedure Laterality Date   CERVICAL BIOPSY  W/ LOOP ELECTRODE EXCISION  2009   Laparoscopic tubal sterilization with Falope rings.  2009   RIGHT/LEFT HEART CATH AND CORONARY ANGIOGRAPHY N/A 03/25/2019   Procedure: RIGHT/LEFT HEART CATH AND CORONARY ANGIOGRAPHY;  Surgeon: Mardell Shade, MD;  Location: [facility];  Service: Cardiovascular;  Laterality: N/A;   TUBAL LIGATION     Family History  Problem Relation Age of Onset   Stroke Mother    Miscarriages / [place] Mother    Depression Mother    Cancer Mother    Arthritis Mother    Hypertension Mother    Heart attack Mother    Mental illness Mother    Hypertension Father    Hyperlipidemia Father    Heart disease Father    Early death Father    Drug abuse Father    Depression Father    Alcohol abuse Father    Asthma Brother    Hypertension Maternal Grandmother    Heart attack Maternal Grandmother    Early death Maternal Grandmother    Diabetes Maternal Grandmother    COPD Maternal Grandmother    Cancer Maternal Grandmother    Arthritis Maternal Grandmother    Stroke Maternal Grandmother    Hypertension Maternal Grandfather    Hyperlipidemia Maternal Grandfather    Heart disease Maternal Grandfather    Heart attack Maternal Grandfather    Early death Maternal Grandfather    Diabetes Maternal Grandfather    COPD Maternal Grandfather    Arthritis Maternal Grandfather    Stroke Maternal Grandfather    Hypertension Paternal Grandmother    Hyperlipidemia Paternal Grandmother    Early death Paternal Grandmother    COPD Paternal Grandmother     Miscarriages / Stillbirths Paternal Grandmother    Stroke Paternal Grandmother    Kidney disease Paternal Grandfather    Hypertension Paternal Grandfather    Hyperlipidemia Paternal Grandfather    Heart attack Paternal Grandfather    Early death Paternal Grandfather    Anesthesia problems Neg Hx    Social History   Socioeconomic History   Marital status: Married    Spouse name: Not on file   Number of children: 4   Years of education: Not on file   Highest education level: High school graduate  Occupational History   Not on file  Tobacco Use   Smoking status: Former    Types: Cigarettes   Smokeless tobacco: Never  Vaping Use   Vaping status: Some Days  Substance and Sexual Activity   Alcohol use: Yes  Alcohol/week: 6.0 standard drinks of alcohol    Types: 6 Shots of liquor per week   Drug use: Not Currently    Types: Marijuana   Sexual activity: Yes    Birth control/protection: Surgical    Comment: band placed around tubes in 2009 surgically by Dr. Monty App  Other Topics Concern   Not on file  Social History Narrative   Not on file   Social Drivers of Health   Financial Resource Strain: High Risk (12/21/2023)   Overall Financial Resource Strain (CARDIA)    Difficulty of Paying Living Expenses: Hard  Food Insecurity: Food Insecurity Present (12/21/2023)   Hunger Vital Sign    Worried About Running Out of Food in the Last Year: Sometimes true    Ran Out of Food in the Last Year: Sometimes true  Transportation Needs: No Transportation Needs (12/21/2023)   PRAPARE - Administrator, Civil Service (Medical): No    Lack of Transportation (Non-Medical): No  Physical Activity: Insufficiently Active (12/21/2023)   Exercise Vital Sign    Days of Exercise per Week: 7 days    Minutes of Exercise per Session: 20 min  Stress: No Stress Concern Present (12/21/2023)   [employer] of Occupational Health - Occupational Stress Questionnaire    Feeling of Stress : Not at  all  Social Connections: Unknown (12/21/2023)   Social Connection and Isolation Panel [NHANES]    Frequency of Communication with Friends and Family: More than three times a week    Frequency of Social Gatherings with Friends and Family: More than three times a week    Attends Religious Services: More than 4 times per year    Active Member of [employer] or Organizations: Yes    Attends Engineer, structural: More than 4 times per year    Marital Status: Patient declined    Tobacco Counseling Counseling given: Not Answered    Clinical Intake:  Pre-visit preparation completed: Yes  Pain : 0-10 Pain Score: 7  Pain Type: Acute pain, Chronic pain Pain Location: Leg Pain Orientation: Left, Right Pain Descriptors / Indicators: Aching, Sharp, Constant, Tingling Pain Onset: Today     BMI - recorded: 29.08 Nutritional Status: BMI 25 -29 Overweight Nutritional Risks: None Diabetes: Yes CBG done?: No Did pt. bring in CBG monitor from home?: No  Lab Results  Component Value Date   HGBA1C 12.9 (H) 12/03/2023   HGBA1C 13.5 (H) 02/25/2023   HGBA1C 11.1 (H) 06/13/2022     How often do you need to have someone help you when you read instructions, pamphlets, or other written materials from your doctor or pharmacy?: 1 - Never What is the last grade level you completed in school?: GED     Information entered by :: [employer]   Activities of Daily Living     12/21/2023    3:18 PM  In your present state of health, do you have any difficulty performing the following activities:  Hearing? 0  Vision? 0  Difficulty concentrating or making decisions? 0  Walking or climbing stairs? 0  Dressing or bathing? 0  Doing errands, shopping? 0  Preparing Food and eating ? N  Using the Toilet? N  In the past six months, have you accidently leaked urine? N  Do you have problems with loss of bowel control? N  Managing your Medications? N  Managing your Finances? N  Housekeeping  or managing your Housekeeping? N    Patient Care Team: [name], NP as  PCP - General (General Practice) Bensimhon, Rheta Celestine, MD as PCP - Advanced Heart Failure (Cardiology)  Indicate any recent Medical Services you may have received from other than [facility] providers in the past year (date may be approximate).     Assessment:   This is a routine wellness examination for Cheryl Mercer.  Hearing/Vision screen Hearing Screening - Comments:: No hearing difficulties Vision Screening - Comments:: Wears glasses   Goals Addressed               This Visit's Progress     Patient Stated (pt-stated)        Getting back on track       Depression Screen     12/21/2023    3:20 PM 12/03/2023   12:10 PM 01/14/2022    9:13 AM 09/13/2021    8:23 AM 09/05/2021    2:01 PM  PHQ 2/9 Scores  PHQ - 2 Score 4 6 1  0 0  PHQ- 9 Score 12 21       Fall Risk     12/21/2023    3:17 PM 12/03/2023   12:10 PM 01/14/2022    9:13 AM 01/08/2022    3:05 PM 09/13/2021    8:22 AM  Fall Risk   Falls in the past year? 1 1 0 0 0  Number falls in past yr: 1 1 0 0 0  Injury with Fall? 1 1 0 0 0  Risk for fall due to : History of fall(s) Impaired mobility No Fall Risks    Follow up Falls prevention discussed;Falls evaluation completed Falls evaluation completed Falls evaluation completed;Education provided;Falls prevention discussed Falls evaluation completed Falls evaluation completed    MEDICARE RISK AT HOME:  Medicare Risk at Home Any stairs in or around the home?: No If so, are there any without handrails?: No Home free of loose throw rugs in walkways, pet beds, electrical cords, etc?: Yes Adequate lighting in your home to reduce risk of falls?: Yes Life alert?: No Use of a cane, walker or w/c?: No Grab bars in the bathroom?: Yes Shower chair or bench in shower?: Yes Elevated toilet seat or a handicapped toilet?: Yes  TIMED UP AND GO:  Was the test performed?  No  Cognitive Function: 6CIT completed         12/21/2023    3:17 PM  6CIT Screen  What Year? 0 points  What month? 0 points  What time? 0 points  Count back from 20 0 points  Months in reverse 0 points  Repeat phrase 0 points  Total Score 0 points    Immunizations Immunization History  Administered Date(s) Administered   Influenza,inj,Quad PF,6+ Mos 06/13/2022   Tdap 06/01/2012    Screening Tests Health Maintenance  Topic Date Due   FOOT EXAM  Never done   OPHTHALMOLOGY EXAM  Never done   Diabetic kidney evaluation - Urine ACR  Never done   Hepatitis C Screening  Never done   Pneumococcal Vaccine 61-52 Years old (1 of 2 - PCV) Never done   Cervical Cancer Screening (HPV/Pap Cotest)  Never done   DTaP/Tdap/Td (2 - Td or Tdap) 06/01/2022   COVID-19 Vaccine (1 - 2024-25 season) Never done   INFLUENZA VACCINE  03/18/2024   HEMOGLOBIN A1C  06/03/2024   Diabetic kidney evaluation - eGFR measurement  12/02/2024   Medicare Annual Wellness (AWV)  12/20/2024   HIV Screening  Completed   HPV VACCINES  Aged Out   Meningococcal B Vaccine  Aged  Out    Health Maintenance  Health Maintenance Due  Topic Date Due   FOOT EXAM  Never done   OPHTHALMOLOGY EXAM  Never done   Diabetic kidney evaluation - Urine ACR  Never done   Hepatitis C Screening  Never done   Pneumococcal Vaccine 73-78 Years old (1 of 2 - PCV) Never done   Cervical Cancer Screening (HPV/Pap Cotest)  Never done   DTaP/Tdap/Td (2 - Td or Tdap) 06/01/2022   COVID-19 Vaccine (1 - 2024-25 season) Never done   Health Maintenance Items Addressed:patient will discussed health maintenance  Additional Screening:  Vision Screening: Recommended annual ophthalmology exams for early detection of glaucoma and other disorders of the eye.  Dental Screening: Recommended annual dental exams for proper oral hygiene  Community Resource Referral / Chronic Care Management: CRR required this visit?  No   CCM required this visit?  No     Plan:     I have  personally reviewed and noted the following in the patient's chart:   Medical and social history Use of alcohol, tobacco or illicit drugs  Current medications and supplements including opioid prescriptions. Patient is not currently taking opioid prescriptions. Functional ability and status Nutritional status Physical activity Advanced directives List of other physicians Hospitalizations, surgeries, and ER visits in previous 12 months Vitals Screenings to include cognitive, depression, and falls Referrals and appointments  In addition, I have reviewed and discussed with patient certain preventive protocols, quality metrics, and best practice recommendations. A written personalized care plan for preventive services as well as general preventive health recommendations were provided to patient.     Cheryl Mercer, [place]   12/21/2023   After Visit Summary: (MyChart) Due to this being a telephonic visit, the after visit summary with patients personalized plan was offered to patient via MyChart   Notes: Nothing significant to report at this time.

## 2023-12-21 NOTE — Patient Instructions (Signed)
 Great to see you today!!!  INCREASE Furosemide  to 40 mg Daily  RESTART Entresto  97/103 mg Twice daily   Direct admit to hospital tomorrow (Tuesday 12/22/23), the hospital will call you when a room is available, once called please report to the Admitting Office inside Main Entrance A of [facility]  Your physician recommends that you schedule a follow-up appointment in: 1 month

## 2023-12-22 ENCOUNTER — Encounter (HOSPITAL_COMMUNITY): Payer: Self-pay | Admitting: Internal Medicine

## 2023-12-22 ENCOUNTER — Inpatient Hospital Stay (HOSPITAL_COMMUNITY)
Admission: AD | Admit: 2023-12-22 | Discharge: 2023-12-25 | DRG: 291 | Disposition: A | Discharge status: Home / Self Care | Source: Ambulatory Visit | Attending: Internal Medicine | Admitting: Internal Medicine

## 2023-12-22 ENCOUNTER — Inpatient Hospital Stay (HOSPITAL_COMMUNITY)

## 2023-12-22 ENCOUNTER — Other Ambulatory Visit: Payer: Self-pay

## 2023-12-22 ENCOUNTER — Telehealth: Payer: Self-pay | Admitting: *Deleted

## 2023-12-22 DIAGNOSIS — Z811 Family history of alcohol abuse and dependence: Secondary | ICD-10-CM

## 2023-12-22 DIAGNOSIS — E875 Hyperkalemia: Secondary | ICD-10-CM | POA: Diagnosis not present

## 2023-12-22 DIAGNOSIS — Z8616 Personal history of COVID-19: Secondary | ICD-10-CM

## 2023-12-22 DIAGNOSIS — E876 Hypokalemia: Secondary | ICD-10-CM | POA: Diagnosis present

## 2023-12-22 DIAGNOSIS — Z813 Family history of other psychoactive substance abuse and dependence: Secondary | ICD-10-CM

## 2023-12-22 DIAGNOSIS — I10 Essential (primary) hypertension: Secondary | ICD-10-CM

## 2023-12-22 DIAGNOSIS — Z91148 Patient's other noncompliance with medication regimen for other reason: Secondary | ICD-10-CM | POA: Diagnosis not present

## 2023-12-22 DIAGNOSIS — Z86711 Personal history of pulmonary embolism: Secondary | ICD-10-CM | POA: Diagnosis not present

## 2023-12-22 DIAGNOSIS — I5043 Acute on chronic combined systolic (congestive) and diastolic (congestive) heart failure: Secondary | ICD-10-CM | POA: Diagnosis not present

## 2023-12-22 DIAGNOSIS — F1010 Alcohol abuse, uncomplicated: Secondary | ICD-10-CM | POA: Diagnosis present

## 2023-12-22 DIAGNOSIS — I5082 Biventricular heart failure: Secondary | ICD-10-CM

## 2023-12-22 DIAGNOSIS — E1142 Type 2 diabetes mellitus with diabetic polyneuropathy: Secondary | ICD-10-CM | POA: Diagnosis present

## 2023-12-22 DIAGNOSIS — I420 Dilated cardiomyopathy: Secondary | ICD-10-CM | POA: Diagnosis present

## 2023-12-22 DIAGNOSIS — Z823 Family history of stroke: Secondary | ICD-10-CM | POA: Diagnosis not present

## 2023-12-22 DIAGNOSIS — E1165 Type 2 diabetes mellitus with hyperglycemia: Secondary | ICD-10-CM | POA: Diagnosis present

## 2023-12-22 DIAGNOSIS — F17290 Nicotine dependence, other tobacco product, uncomplicated: Secondary | ICD-10-CM | POA: Diagnosis present

## 2023-12-22 DIAGNOSIS — Z794 Long term (current) use of insulin: Secondary | ICD-10-CM | POA: Diagnosis not present

## 2023-12-22 DIAGNOSIS — Z79899 Other long term (current) drug therapy: Secondary | ICD-10-CM | POA: Diagnosis not present

## 2023-12-22 DIAGNOSIS — I110 Hypertensive heart disease with heart failure: Secondary | ICD-10-CM | POA: Diagnosis present

## 2023-12-22 DIAGNOSIS — I5023 Acute on chronic systolic (congestive) heart failure: Secondary | ICD-10-CM

## 2023-12-22 DIAGNOSIS — E785 Hyperlipidemia, unspecified: Secondary | ICD-10-CM

## 2023-12-22 DIAGNOSIS — Z8249 Family history of ischemic heart disease and other diseases of the circulatory system: Secondary | ICD-10-CM

## 2023-12-22 DIAGNOSIS — Z5941 Food insecurity: Secondary | ICD-10-CM | POA: Diagnosis not present

## 2023-12-22 DIAGNOSIS — E119 Type 2 diabetes mellitus without complications: Secondary | ICD-10-CM

## 2023-12-22 LAB — COMPREHENSIVE METABOLIC PANEL WITH GFR
ALT: 13 U/L (ref 0–44)
AST: 36 U/L (ref 15–41)
Albumin: 2.1 g/dL — ABNORMAL LOW (ref 3.5–5.0)
Alkaline Phosphatase: 124 U/L (ref 38–126)
Anion gap: 14 (ref 5–15)
BUN: 10 mg/dL (ref 6–20)
CO2: 30 mmol/L (ref 22–32)
Calcium: 8.7 mg/dL — ABNORMAL LOW (ref 8.9–10.3)
Chloride: 95 mmol/L — ABNORMAL LOW (ref 98–111)
Creatinine, Ser: 1.05 mg/dL — ABNORMAL HIGH (ref 0.44–1.00)
GFR, Estimated: >60 mL/min (ref >60)
Glucose, Bld: 149 mg/dL — ABNORMAL HIGH (ref 70–99)
Potassium: 2.8 mmol/L — ABNORMAL LOW (ref 3.5–5.1)
Sodium: 139 mmol/L (ref 135–145)
Total Bilirubin: 0.9 mg/dL (ref 0.0–1.2)
Total Protein: 6.8 g/dL (ref 6.5–8.1)

## 2023-12-22 LAB — CBC
HCT: 41.4 % (ref 36.0–46.0)
Hemoglobin: 14.3 g/dL (ref 12.0–15.0)
MCH: 31 pg (ref 26.0–34.0)
MCHC: 34.5 g/dL (ref 30.0–36.0)
MCV: 89.6 fL (ref 80.0–100.0)
Platelets: 310 10*3/uL (ref 150–400)
RBC: 4.62 MIL/uL (ref 3.87–5.11)
RDW: 13.1 % (ref 11.5–15.5)
WBC: 11.4 10*3/uL — ABNORMAL HIGH (ref 4.0–10.5)
nRBC: 0 % (ref 0.0–0.2)

## 2023-12-22 LAB — RAPID URINE DRUG SCREEN, HOSP PERFORMED
Amphetamines: POSITIVE — AB
Barbiturates: NOT DETECTED
Benzodiazepines: NOT DETECTED
Cocaine: NOT DETECTED
Opiates: NOT DETECTED
Tetrahydrocannabinol: NOT DETECTED

## 2023-12-22 LAB — GLUCOSE, CAPILLARY
Glucose-Capillary: 178 mg/dL — ABNORMAL HIGH (ref 70–99)
Glucose-Capillary: 232 mg/dL — ABNORMAL HIGH (ref 70–99)

## 2023-12-22 LAB — BRAIN NATRIURETIC PEPTIDE: B Natriuretic Peptide: 1451.1 pg/mL — ABNORMAL HIGH (ref 0.0–100.0)

## 2023-12-22 MED ORDER — SODIUM CHLORIDE 0.9% FLUSH
3.0000 mL | Freq: Two times a day (BID) | INTRAVENOUS | Status: DC
  Administered 2023-12-22 – 2023-12-24 (×5): 3 mL via INTRAVENOUS

## 2023-12-22 MED ORDER — SACUBITRIL-VALSARTAN 97-103 MG PO TABS
1.0000 | ORAL_TABLET | Freq: Two times a day (BID) | ORAL | Status: DC
Start: 2023-12-22 — End: 2023-12-22

## 2023-12-22 MED ORDER — SODIUM CHLORIDE 0.9 % IV SOLN
250.0000 mL | INTRAVENOUS | Status: AC | PRN
  Administered 2023-12-23: 250 mL via INTRAVENOUS

## 2023-12-22 MED ORDER — INSULIN ASPART 100 UNIT/ML IJ SOLN
0.0000 [IU] | Freq: Three times a day (TID) | INTRAMUSCULAR | Status: DC
  Administered 2023-12-22: 3 [IU] via SUBCUTANEOUS
  Administered 2023-12-23: 8 [IU] via SUBCUTANEOUS

## 2023-12-22 MED ORDER — ACETAMINOPHEN 325 MG PO TABS
650.0000 mg | ORAL_TABLET | ORAL | Status: DC | PRN
  Administered 2023-12-22 – 2023-12-24 (×3): 650 mg via ORAL
  Filled 2023-12-22 (×3): qty 2

## 2023-12-22 MED ORDER — SODIUM CHLORIDE 0.9% FLUSH: 3.0000 mL | INTRAVENOUS | Status: DC | PRN

## 2023-12-22 MED ORDER — SPIRONOLACTONE 25 MG PO TABS
25.0000 mg | ORAL_TABLET | Freq: Every day | ORAL | Status: DC
  Administered 2023-12-22: 25 mg via ORAL
  Filled 2023-12-22: qty 1

## 2023-12-22 MED ORDER — ATORVASTATIN CALCIUM 40 MG PO TABS
40.0000 mg | ORAL_TABLET | Freq: Every day | ORAL | Status: DC
  Administered 2023-12-22 – 2023-12-25 (×4): 40 mg via ORAL
  Filled 2023-12-22 (×4): qty 1

## 2023-12-22 MED ORDER — SACUBITRIL-VALSARTAN 49-51 MG PO TABS
1.0000 | ORAL_TABLET | Freq: Two times a day (BID) | ORAL | Status: DC
  Administered 2023-12-22: 1 via ORAL
  Filled 2023-12-22: qty 1

## 2023-12-22 MED ORDER — FUROSEMIDE 10 MG/ML IJ SOLN
80.0000 mg | Freq: Two times a day (BID) | INTRAMUSCULAR | Status: DC
  Administered 2023-12-22: 80 mg via INTRAVENOUS
  Filled 2023-12-22: qty 8

## 2023-12-22 MED ORDER — HEPARIN SODIUM (PORCINE) 5000 UNIT/ML IJ SOLN
5000.0000 [IU] | Freq: Three times a day (TID) | INTRAMUSCULAR | Status: DC
  Administered 2023-12-22 – 2023-12-25 (×10): 5000 [IU] via SUBCUTANEOUS
  Filled 2023-12-22 (×10): qty 1

## 2023-12-22 MED ORDER — DIGOXIN 125 MCG PO TABS
0.1250 mg | ORAL_TABLET | Freq: Every day | ORAL | Status: DC
  Administered 2023-12-22 – 2023-12-25 (×4): 0.125 mg via ORAL
  Filled 2023-12-22 (×4): qty 1

## 2023-12-22 NOTE — Plan of Care (Signed)
 Alert and oriented, admitted as a direct admit for diurese.  Educated on plan of care.   Problem: Education: Goal: Knowledge of General Education information will improve Description: Including pain rating scale, medication(s)/side effects and non-pharmacologic comfort measures Outcome: Progressing   Problem: Health Behavior/Discharge Planning: Goal: Ability to manage health-related needs will improve Outcome: Progressing   Problem: Clinical Measurements: Goal: Ability to maintain clinical measurements within normal limits will improve Outcome: Progressing Goal: Will remain free from infection Outcome: Progressing Goal: Diagnostic test results will improve Outcome: Progressing

## 2023-12-22 NOTE — Progress Notes (Signed)
 Heart Failure Navigator Progress Note  Assessed for Heart & Vascular TOC clinic readiness.  Patient does not meet criteria due to Advanced Heart Failure Team patient of Dr. Gala Romney. .   Navigator will sign off at this time.   Rhae Hammock, BSN, Scientist, clinical (histocompatibility and immunogenetics) Only

## 2023-12-22 NOTE — Progress Notes (Signed)
 Arrived to [facility] as a Direct Admit from Admissions.  CHF Team paged regarding patients arrival for orders and to evaluate patient.

## 2023-12-22 NOTE — Progress Notes (Signed)
 Placed on telemetry monitor and vital signs obtained.  Oriented to unit routine and call bell usage.      12/22/23 1429 12/22/23 1435  Vitals  Temp  --  98.1 F (36.7 C)  Temp Source  --  Oral  BP  --  (!) 127/93  MAP (mmHg)  --  104  BP Location  --  Right Arm  BP Method  --  Automatic  Patient Position (if appropriate)  --  Sitting  Pulse Rate  --  (!) 101  Pulse Rate Source  --  Monitor  Resp  --  19  MEWS COLOR  MEWS Score Color  --  Green  Oxygen Therapy  SpO2  --  99 %  O2 Device  --  Room Air  Height and Weight  Height 4\' 11"  (1.499 m)  --   Weight 62 kg  --   Type of Scale Used Standing  --   Type of Weight Actual  --   BSA (Calculated - sq m) 1.61 sq meters  --   BMI (Calculated) 27.59  --   Weight in (lb) to have BMI = 25 123.5  --   ECG Monitoring  Telemetry Box Number [id number]  --   Tele Box Verification Completed by Second Verifier Completed (Roxy NT)  --

## 2023-12-22 NOTE — Care Management Important Message (Signed)
 Important Message  Patient Details  Name: Cheryl Mercer MRN: [id number] Date of Birth: [birth year]   Important Message Given:  Yes - Medicare IM     Janith Melnick 12/22/2023, 3:15 PM

## 2023-12-22 NOTE — Progress Notes (Signed)
 Complex Care Management Note Care Guide Note  12/22/2023 Name: Cheryl Mercer MRN: [id number] DOB: [birth year]  Cheryl Mercer is a 38 y.o. year old female who is a primary care patient of [name], NP . The community resource team was consulted for assistance with food , utilities   SDOH screenings and interventions completed:  Yes     SDOH Interventions Today    Flowsheet Row Most Recent Value  SDOH Interventions   Food Insecurity Interventions Community Resources Provided, [id number] Referral  [[place] food finder , out of the garden]  Utilities Interventions --  [Provided utility resources]  Financial Strain Interventions [id number] Referral      Provided resources for food and utilities   Care guide performed the following interventions: Patient provided with information about care guide support team and interviewed to confirm resource needs.  Follow Up Plan:  No further follow up planned at this time. The patient has been provided with needed resources.  Encounter Outcome:  Patient Visit Completed  [name]  [facility] HealthPopulation Health Care Guide  Direct Dial :[phone] Fax:[phone] Website: [email or web address]

## 2023-12-23 ENCOUNTER — Other Ambulatory Visit: Payer: Self-pay

## 2023-12-23 ENCOUNTER — Other Ambulatory Visit (HOSPITAL_COMMUNITY)

## 2023-12-23 ENCOUNTER — Telehealth (HOSPITAL_COMMUNITY): Payer: Self-pay | Admitting: Pharmacy Technician

## 2023-12-23 ENCOUNTER — Other Ambulatory Visit (HOSPITAL_COMMUNITY): Payer: Self-pay

## 2023-12-23 DIAGNOSIS — I5043 Acute on chronic combined systolic (congestive) and diastolic (congestive) heart failure: Secondary | ICD-10-CM

## 2023-12-23 LAB — MAGNESIUM: Magnesium: 1.2 mg/dL — ABNORMAL LOW (ref 1.7–2.4)

## 2023-12-23 LAB — BETA-HYDROXYBUTYRIC ACID: Beta-Hydroxybutyric Acid: 0.09 mmol/L (ref 0.05–0.27)

## 2023-12-23 LAB — BASIC METABOLIC PANEL WITH GFR
Anion gap: 14 (ref 5–15)
Anion gap: 12 (ref 5–15)
BUN: 12 mg/dL (ref 6–20)
BUN: 9 mg/dL (ref 6–20)
CO2: 29 mmol/L (ref 22–32)
CO2: 31 mmol/L (ref 22–32)
Calcium: 8.3 mg/dL — ABNORMAL LOW (ref 8.9–10.3)
Calcium: 8.4 mg/dL — ABNORMAL LOW (ref 8.9–10.3)
Chloride: 90 mmol/L — ABNORMAL LOW (ref 98–111)
Chloride: 95 mmol/L — ABNORMAL LOW (ref 98–111)
Creatinine, Ser: 1.05 mg/dL — ABNORMAL HIGH (ref 0.44–1.00)
Creatinine, Ser: 0.99 mg/dL (ref 0.44–1.00)
GFR, Estimated: >60 mL/min (ref >60)
GFR, Estimated: >60 mL/min (ref >60)
Glucose, Bld: 390 mg/dL — ABNORMAL HIGH (ref 70–99)
Glucose, Bld: 160 mg/dL — ABNORMAL HIGH (ref 70–99)
Potassium: 2.9 mmol/L — ABNORMAL LOW (ref 3.5–5.1)
Potassium: 3.4 mmol/L — ABNORMAL LOW (ref 3.5–5.1)
Sodium: 133 mmol/L — ABNORMAL LOW (ref 135–145)
Sodium: 138 mmol/L (ref 135–145)

## 2023-12-23 LAB — COOXEMETRY PANEL
Carboxyhemoglobin: 1 % (ref 0.5–1.5)
Methemoglobin: <0.7 % (ref 0.0–1.5)
O2 Saturation: 59 %
Total hemoglobin: 16.7 g/dL — ABNORMAL HIGH (ref 12.0–16.0)

## 2023-12-23 LAB — GLUCOSE, CAPILLARY
Glucose-Capillary: 292 mg/dL — ABNORMAL HIGH (ref 70–99)
Glucose-Capillary: 173 mg/dL — ABNORMAL HIGH (ref 70–99)
Glucose-Capillary: 288 mg/dL — ABNORMAL HIGH (ref 70–99)
Glucose-Capillary: 127 mg/dL — ABNORMAL HIGH (ref 70–99)

## 2023-12-23 LAB — LACTIC ACID, PLASMA: Lactic Acid, Venous: 2.6 mmol/L (ref 0.5–1.9)

## 2023-12-23 MED ORDER — POTASSIUM CHLORIDE CRYS ER 20 MEQ PO TBCR
40.0000 meq | EXTENDED_RELEASE_TABLET | ORAL | Status: AC
  Administered 2023-12-23 (×2): 40 meq via ORAL
  Filled 2023-12-23 (×2): qty 2

## 2023-12-23 MED ORDER — POTASSIUM CHLORIDE CRYS ER 20 MEQ PO TBCR
40.0000 meq | EXTENDED_RELEASE_TABLET | Freq: Once | ORAL | Status: AC
  Administered 2023-12-23: 40 meq via ORAL
  Filled 2023-12-23: qty 2

## 2023-12-23 MED ORDER — INSULIN GLARGINE-YFGN 100 UNIT/ML ~~LOC~~ SOLN
25.0000 [IU] | Freq: Every day | SUBCUTANEOUS | Status: DC
  Administered 2023-12-23: 25 [IU] via SUBCUTANEOUS
  Filled 2023-12-23: qty 0.25

## 2023-12-23 MED ORDER — MAGNESIUM SULFATE 2 GM/50ML IV SOLN
2.0000 g | Freq: Once | INTRAVENOUS | Status: AC
  Administered 2023-12-23: 2 g via INTRAVENOUS
  Filled 2023-12-23: qty 50

## 2023-12-23 MED ORDER — SPIRONOLACTONE 25 MG PO TABS
50.0000 mg | ORAL_TABLET | Freq: Every day | ORAL | Status: DC
  Administered 2023-12-23: 50 mg via ORAL
  Filled 2023-12-23: qty 2

## 2023-12-23 MED ORDER — INSULIN GLARGINE-YFGN 100 UNIT/ML ~~LOC~~ SOLN
10.0000 [IU] | Freq: Once | SUBCUTANEOUS | Status: AC
  Administered 2023-12-23: 10 [IU] via SUBCUTANEOUS
  Filled 2023-12-23: qty 0.1

## 2023-12-23 MED ORDER — TRAMADOL HCL 50 MG PO TABS
50.0000 mg | ORAL_TABLET | Freq: Three times a day (TID) | ORAL | Status: DC | PRN
  Administered 2023-12-23 – 2023-12-24 (×2): 50 mg via ORAL
  Filled 2023-12-23 (×3): qty 1

## 2023-12-23 MED ORDER — SACUBITRIL-VALSARTAN 97-103 MG PO TABS
1.0000 | ORAL_TABLET | Freq: Two times a day (BID) | ORAL | Status: DC
  Administered 2023-12-23 – 2023-12-25 (×5): 1 via ORAL
  Filled 2023-12-23 (×5): qty 1

## 2023-12-23 MED ORDER — POTASSIUM CHLORIDE CRYS ER 20 MEQ PO TBCR
60.0000 meq | EXTENDED_RELEASE_TABLET | Freq: Once | ORAL | Status: AC
  Administered 2023-12-23: 60 meq via ORAL
  Filled 2023-12-23: qty 3

## 2023-12-23 MED ORDER — INSULIN ASPART 100 UNIT/ML IJ SOLN: 0.0000 [IU] | Freq: Three times a day (TID) | INTRAMUSCULAR | Status: DC

## 2023-12-23 MED ORDER — INSULIN ASPART 100 UNIT/ML IJ SOLN
0.0000 [IU] | Freq: Three times a day (TID) | INTRAMUSCULAR | Status: DC
  Administered 2023-12-23: 11 [IU] via SUBCUTANEOUS
  Administered 2023-12-23: 4 [IU] via SUBCUTANEOUS
  Administered 2023-12-24: 11 [IU] via SUBCUTANEOUS

## 2023-12-23 MED ORDER — SODIUM CHLORIDE 0.9% FLUSH
10.0000 mL | INTRAVENOUS | Status: DC | PRN
  Administered 2023-12-25: 10 mL

## 2023-12-23 MED ORDER — INSULIN ASPART 100 UNIT/ML IJ SOLN: 0.0000 [IU] | Freq: Every day | INTRAMUSCULAR | Status: DC

## 2023-12-23 MED ORDER — FUROSEMIDE 10 MG/ML IJ SOLN
80.0000 mg | Freq: Two times a day (BID) | INTRAMUSCULAR | Status: DC
  Administered 2023-12-23 – 2023-12-24 (×4): 80 mg via INTRAVENOUS
  Filled 2023-12-23 (×4): qty 8

## 2023-12-23 MED ORDER — POTASSIUM CHLORIDE 10 MEQ/100ML IV SOLN
10.0000 meq | INTRAVENOUS | Status: DC
Start: 2023-12-23 — End: 2023-12-23
  Administered 2023-12-23: 10 meq via INTRAVENOUS
  Filled 2023-12-23: qty 100

## 2023-12-23 MED ORDER — MAGNESIUM SULFATE 4 GM/100ML IV SOLN
4.0000 g | Freq: Once | INTRAVENOUS | Status: AC
  Administered 2023-12-23: 4 g via INTRAVENOUS
  Filled 2023-12-23: qty 100

## 2023-12-23 MED ORDER — MELATONIN 3 MG PO TABS
3.0000 mg | ORAL_TABLET | Freq: Every day | ORAL | Status: DC
Start: 2023-12-23 — End: 2023-12-25
  Administered 2023-12-23 – 2023-12-24 (×2): 3 mg via ORAL
  Filled 2023-12-23 (×2): qty 1

## 2023-12-23 MED ORDER — SODIUM CHLORIDE 0.9% FLUSH
10.0000 mL | Freq: Two times a day (BID) | INTRAVENOUS | Status: DC
  Administered 2023-12-23: 20 mL
  Administered 2023-12-24 – 2023-12-25 (×3): 10 mL

## 2023-12-23 MED ORDER — CHLORHEXIDINE GLUCONATE CLOTH 2 % EX PADS
6.0000 | MEDICATED_PAD | Freq: Every day | CUTANEOUS | Status: DC
  Administered 2023-12-23 – 2023-12-25 (×3): 6 via TOPICAL

## 2023-12-23 MED ORDER — LIVING WELL WITH DIABETES BOOK
Freq: Once | Status: AC
  Filled 2023-12-23: qty 1

## 2023-12-23 MED ORDER — INSULIN GLARGINE-YFGN 100 UNIT/ML ~~LOC~~ SOLN
35.0000 [IU] | Freq: Every day | SUBCUTANEOUS | Status: DC
  Administered 2023-12-24: 35 [IU] via SUBCUTANEOUS
  Filled 2023-12-23 (×2): qty 0.35

## 2023-12-23 NOTE — Telephone Encounter (Signed)
 Patient Product/process development scientist completed.    The patient is insured through [facility]. Patient has Medicare and is not eligible for a copay card, but may be able to apply for patient assistance or Medicare RX Payment Plan (Patient Must reach out to their plan, if eligible for payment plan), if available.    Ran test claim for Entresto 24-26 mg and the current 30 day co-pay is $0.00.  Ran test claim for Farxiga 10 mg and the current 30 day co-pay is $0.00.  Ran test claim for Jardiance 10 mg and the current 30 day co-pay is $0.00.    This test claim was processed through [facility]- copay amounts may vary at other pharmacies due to pharmacy/plan contracts, or as the patient moves through the different stages of their insurance plan.     Roland Earl, CPHT Pharmacy Technician III Certified Patient Advocate [facility] Patient Advocate Team Direct Number: [phone]  Fax: [phone]

## 2023-12-23 NOTE — Progress Notes (Signed)
 Reported critical Lactic Acid level of 2.6 to [place] Lee, NP. New orders for Labs and PICC received.

## 2023-12-23 NOTE — Progress Notes (Addendum)
 Advanced Heart Failure Rounding Note  Cardiologist: None  Chief Complaint: Acute Heart Failure Subjective:    UOP 2L (I/Os not complete) on Lasix  80 mg IV bid. K 2.9. Mag pending.  Hypertensive overnight. 140/90s.  Feeling better this morning, just tired. No SOB, CP.   Objective:    Weight Range: 61.1 kg Body mass index is 27.23 kg/m.   Vital Signs:   Temp:  [97.4 F (36.3 C)-98.1 F (36.7 C)] 97.4 F (36.3 C) (05/07 0531) Pulse Rate:  [101-110] 106 (05/07 0531) Resp:  [19-20] 20 (05/07 0531) BP: (118-155)/(86-105) 118/105 (05/07 0531) SpO2:  [98 %-100 %] 100 % (05/07 0531) Weight:  [61.1 kg-62 kg] 61.1 kg (05/07 0531)   Weight change: Filed Weights   12/22/23 1429 12/23/23 0531  Weight: 62 kg 61.1 kg   Intake/Output:  Intake/Output Summary (Last 24 hours) at 12/23/2023 0719 Last data filed at 12/23/2023 0535 Gross per 24 hour  Intake 243 ml  Output 2000 ml  Net -1757 ml    Physical Exam    General: Appears older than age, tired. No distress on RA Cardiac: JVP ~8cm. S1 and S2 present. No murmurs or rub. Abdomen: Soft, non-tender, non-distended.  Extremities: Warm and dry.  Trace BLE edema.  Neuro: Alert and oriented x3. Affect pleasant. Moves all extremities without difficulty.  Telemetry   ST 100s (personally reviewed)  EKG    ST 102 bpm  Labs    CBC Recent Labs    12/22/23 1455  WBC 11.4*  HGB 14.3  HCT 41.4  MCV 89.6  PLT 310   Basic Metabolic Panel Recent Labs    38/75/64 1455 12/23/23 0238  NA 139 133*  K 2.8* 2.9*  CL 95* 90*  CO2 30 29  GLUCOSE 149* 390*  BUN 10 12  CREATININE 1.05* 1.05*  CALCIUM  8.7* 8.3*   Liver Function Tests Recent Labs    12/22/23 1455  AST 36  ALT 13  ALKPHOS 124  BILITOT 0.9  PROT 6.8  ALBUMIN 2.1*   BNP (last 3 results) Recent Labs    03/06/23 0916 11/03/23 1718 12/22/23 1455  BNP 382.8* 1,882.2* 1,451.1*   ProBNP (last 3 results) Recent Labs    12/03/23 1235  PROBNP 1,145.0*    Imaging   DG Chest 2 View Result Date: 12/22/2023 CLINICAL DATA:  33295 CHF (congestive heart failure) (HCC) 97293 EXAM: CHEST - 2 VIEW COMPARISON:  11/03/2023 FINDINGS: Similar mild cardiac enlargement without acute CHF. Minor bibasilar interstitial fibrotic pattern. No large effusion or pneumothorax. Trachea midline. No osseous abnormality. IMPRESSION: Cardiomegaly without acute process. Minor bibasilar fibrotic pattern. Electronically Signed   By: Melven Stable.  Shick M.D.   On: 12/22/2023 17:42   Medications:    Scheduled Medications:  atorvastatin   40 mg Oral Daily   digoxin   0.125 mg Oral Daily   furosemide   80 mg Intravenous BID   heparin   5,000 Units Subcutaneous Q8H   insulin  aspart  0-15 Units Subcutaneous TID WC   sacubitril -valsartan   1 tablet Oral BID   sodium chloride  flush  3 mL Intravenous Q12H   spironolactone   25 mg Oral Daily   Infusions:  sodium chloride      potassium chloride  10 mEq (12/23/23 0517)   PRN Medications: sodium chloride , acetaminophen , sodium chloride  flush  Patient Profile   38 y.o. Hispanic female with chronic systolic heart failure, h/o PE, migraines, hypertension, poorly controlled type 2 diabetes mellitus, poor compliance and ETOH use, admitted for a/c CHF w/ marked  volume overload.   Assessment/Plan   1. Acute on Chronic biventricular systolic heart failure - Echo (10/23): EF <20%, RV severely reduced. Mild concentric LVH, LV with GHK, smoke seen in LA, RA severely dilated, mild MR - Echo (7/24): EF < 20%, RV moderately reduced  - Suspect CM 2/2 uncontrolled hypertension, ETOH, medication noncompliance and DM - admitted w/ NYHA Class IIIb symptoms + marked volume overload, in setting of poor med compliance - continue IV Lasix  80 mg bid again today  - increase Entresto  to 97/103 mg bid - increase spiro to 50 mg daily to assist with K - continue digoxin  0.125 mg daily - Hold Coreg  until diuresed  - No SGLT2i with uncontrolled T2DM, A1c 12.9 - not  candidate for ICD or advanced therapies with noncompliance - update echo pending today - will check lactic acid with afternoon labs   2. HTN - remains up overnight - meds as above   3. DM II, Poorly Controlled  - Off SGLT2i with A1c 13.5 > 12.9 - SSI. Adding Lantus  today - consult diabetes coordinator    4. H/o PE w/ pulmonary infarct - treated with eliquis  x 6 months 4/21   5. Alcohol use - still drinking some. Likely underreporting.    6. HLD - Recent LP (4/25) LDL 112.  Goal in setting of DM < 70  - continue atorv 40 mg daily - repeat LFTs and Lipids in 6-8 weeks  7. Hypokalemia - K 2.8 on admit, 2.9 this morning. Mg pending. - refusing IV replacement - increase spiro - aggressive PO supp. Re-time AM lasix  dose after PO KCL - BMET this afternoon  Length of Stay: 1  [place] Lee, NP  12/23/2023, 7:19 AM  Advanced Heart Failure Team Pager [phone] (M-F; 7a - 5p)  Please contact [facility] for night-coverage after hours (5p -7a ) and weekends on [email or web address]  Patient seen and examined with the above-signed Advanced Practice Provider and/or Housestaff. I personally reviewed laboratory data, imaging studies and relevant notes. I independently examined the patient and formulated the important aspects of the plan. I have edited the note to reflect any of my changes or salient points. I have personally discussed the plan with the patient and/or family.  Continues to diurese. Breathing better. K remains low. BP still high  General:  Sitting up on side of bed No resp difficulty HEENT: normal Neck: supple. JVP 8-9 Carotids 2+ bilat; no bruits. No lymphadenopathy or thryomegaly appreciated. Cor: PMI nondisplaced. Regular rate & rhythm. No rubs, gallops or murmurs. Lungs: clear Abdomen: soft, nontender, nondistended. No hepatosplenomegaly. No bruits or masses. Good bowel sounds. Extremities: no cyanosis, clubbing, rash, 2+ edema Neuro: alert & orientedx3, cranial nerves grossly  intact. moves all 4 extremities w/o difficulty. Affect pleasant  Remains volume overloaded and hypertensive. Continue IV diuresis. Continue to titrate GDMT. Place TED hose. Supp K   Appreciate recs from DM Coordinator.   Jules Oar, MD  1:19 PM

## 2023-12-23 NOTE — Inpatient Diabetes Management (Addendum)
 Inpatient Diabetes Program Recommendations  AACE/ADA: New Consensus Statement on Inpatient Glycemic Control (2015)  Target Ranges:  Prepandial:   less than 140 mg/dL      Peak postprandial:   less than 180 mg/dL (1-2 hours)      Critically ill patients:  140 - 180 mg/dL   Lab Results  Component Value Date   GLUCAP 292 (H) 12/23/2023   HGBA1C 12.9 (H) 12/03/2023    Latest Reference Range & Units 02/25/23 00:25 12/03/23 12:35  Hemoglobin A1C 4.6 - 6.5 % 13.5 (H) 12.9 (H)  (H): Data is abnormally high  Latest Reference Range & Units 12/22/23 15:54 12/22/23 21:28 12/23/23 06:09  Glucose-Capillary 70 - 99 mg/dL 540 (H) 981 (H) 191 (H)  (H): Data is abnormally high   Diabetes history: DM2 Outpatient Diabetes medications: Lantus  40 units daily, Humalog  2-15 units tid Current orders for Inpatient glycemic control: Semglee  25 units daily, Novolog  0-15 units tid  Inpatient Diabetes Program Recommendations:   Please consider: -Increase Semglee  to 35 units daily -Add Novolog  0-5 units hs correction  Ordered Living Well with Diabetes booklet. A1c 12.9 (average blood glucose 324 over the past 2-3 months) Discussed with pt. Patient just started on insulin  one month ago.  Patient requests CGM and will bring sample in am.    Thank you, Chelsa Stout E. Bellamarie Pflug, RN, MSN, CDCES  Diabetes Coordinator Inpatient Glycemic Control Team Team Pager [phone] (8am-5pm) 12/23/2023 11:21 AM

## 2023-12-23 NOTE — TOC Initial Note (Signed)
 Transition of Care [facility]) - Initial/Assessment Note    Patient Details  Name: Cheryl Mercer MRN: [id number] Date of Birth: 1986-03-16  Transition of Care [facility]) CM/SW Contact:    Ernst Heap Phone Number: [phone] 12/23/2023, 10:57 AM  Clinical Narrative:     HF CSW met wit patient at bedside. Patient stated that she lives with her husband and two children. Patient stated that she has has had HH services in the past, but nothing current. Patient stated that she does not use any equipment. Patient stated that she does not have a scale at home. CSW explained to the patient that she may be able to get a scale through her insurance if she has an Data processing manager one from the store. Patient stated that she is not working. Patient stated that she has a PCP and just seen her 2 weeks ago. CSW explained that a hospital follow up appointment may be scheduled closer to dc. Patient agrees. Patient stated that her daughter drives and typically has family to assist with appointments.  Family will provide transpiration at dc.   TOC will continue following.               Expected Discharge Plan: Home/Self Care Barriers to Discharge: Continued Medical Work up   Patient Goals and CMS Choice            Expected Discharge Plan and Services       Living arrangements for the past 2 months: Single Family Home                                      Prior Living Arrangements/Services Living arrangements for the past 2 months: Single Family Home Lives with:: Spouse, Adult Children, Minor Children Patient language and need for interpreter reviewed:: Yes Do you feel safe going back to the place where you live?: Yes      Need for Family Participation in Patient Care: Yes (Comment) Care giver support system in place?: Yes (comment)   Criminal Activity/Legal Involvement Pertinent to Current Situation/Hospitalization: No - Comment as needed  Activities of Daily Living   ADL  Screening (condition at time of admission) Independently performs ADLs?: Yes (appropriate for developmental age) Is the patient deaf or have difficulty hearing?: No Does the patient have difficulty seeing, even when wearing glasses/contacts?: No Does the patient have difficulty concentrating, remembering, or making decisions?: No  Permission Sought/Granted                  Emotional Assessment Appearance:: Appears stated age, Disheveled Attitude/Demeanor/Rapport: Engaged Affect (typically observed): Appropriate Orientation: : Oriented to Self, Oriented to Place, Oriented to  Time, Oriented to Situation Alcohol / Substance Use: Not Applicable Psych Involvement: No (comment)  Admission diagnosis:  Acute on chronic combined systolic and diastolic CHF, NYHA class 3 (HCC) [I50.43] Patient Active Problem List   Diagnosis Date Noted   Acute on chronic combined systolic and diastolic CHF, NYHA class 3 (HCC) 12/22/2023   Establishing care with new doctor, encounter for 12/03/2023   Fainting spell    Demand ischemia (HCC) 02/26/2023   HLD (hyperlipidemia) 02/26/2023   History of chronic CHF EF less than 20% 02/23/2023   CHF exacerbation (HCC) 02/23/2023   AKI (acute kidney injury) (HCC) 02/23/2023   Essential hypertension 02/23/2023   Diabetic neuropathy (HCC) 06/15/2022   Hypomagnesemia 06/14/2022   Obesity (BMI 30-39.9) 06/13/2022  Hypokalemia 06/12/2022   No pertinent past medical history 12/31/2021   Hypotension 12/31/2021   CHF (congestive heart failure) (HCC) 06/14/2021   Non compliance w medication regimen 06/14/2021   Hyponatremia 06/14/2021   Acute exacerbation of CHF (congestive heart failure) (HCC) 07/10/2020   Apnea    Pulmonary infarct (HCC) 12/17/2019   History of pulmonary embolus (PE) 12/16/2019   CAP (community acquired pneumonia) 12/16/2019   Dilated cardiomyopathy (HCC) 11/10/2019   Elevated troponin 11/07/2019   Multifocal pneumonia 09/07/2019   Acute on  chronic systolic CHF (congestive heart failure) (HCC) 09/07/2019   Dyslipidemia 09/07/2019   Back pain 09/07/2019   Atypical pneumonia 09/07/2019   Diabetes mellitus type 2, insulin  dependent (HCC) 03/23/2019   Peripheral edema 03/23/2019   Cardiomegaly 03/23/2019   Proteinuria 03/23/2019   Acute bacterial tonsillitis 05/18/2013    Class: Acute   Lower urinary tract infectious disease 07/30/2012   PCP:  [name], NP Pharmacy:   [phone] - [address], [place] - [address] [place] [address] Phone: [phone] Fax: [phone]  [facility] [address] [place] [address] Phone: [phone] Fax: [phone]     Social Drivers of Health (SDOH) Social History: SDOH Screenings   Food Insecurity: Food Insecurity Present (12/22/2023)  Housing: Low Risk  (12/22/2023)  Transportation Needs: No Transportation Needs (12/22/2023)  Utilities: At Risk (12/22/2023)  Alcohol Screen: Low Risk  (12/21/2023)  Depression (PHQ2-9): High Risk (12/21/2023)  Financial Resource Strain: High Risk (12/21/2023)  Physical Activity: Insufficiently Active (12/21/2023)  Social Connections: Unknown (12/21/2023)  Stress: No Stress Concern Present (12/21/2023)  Tobacco Use: Medium Risk (12/22/2023)  Health Literacy: Adequate Health Literacy (12/21/2023)   SDOH Interventions:     Readmission Risk Interventions     No data to display

## 2023-12-23 NOTE — Telephone Encounter (Signed)
 Patient Product/process development scientist completed.    The patient is insured through [facility]. Patient has Medicare and is not eligible for a copay card, but may be able to apply for patient assistance or Medicare RX Payment Plan (Patient Must reach out to their plan, if eligible for payment plan), if available.    Ran test claim for Dexcom G7 Sensor and the current 30 day co-pay is $0.00.  Ran test claim for Freestyle Libre 3 Plus Sensor and the current 30 day co-pay is $0.00.  This test claim was processed through [facility]- copay amounts may vary at other pharmacies due to pharmacy/plan contracts, or as the patient moves through the different stages of their insurance plan.     Morgan Arab, CPHT Pharmacy Technician III Certified Patient Advocate [facility] Patient Advocate Team Direct Number: [phone]  Fax: [phone]

## 2023-12-23 NOTE — Progress Notes (Signed)
 Peripherally Inserted Central Catheter Placement  The IV Nurse has discussed with the patient and/or persons authorized to consent for the patient, the purpose of this procedure and the potential benefits and risks involved with this procedure.  The benefits include less needle sticks, lab draws from the catheter, and the patient may be discharged home with the catheter. Risks include, but not limited to, infection, bleeding, blood clot (thrombus formation), and puncture of an artery; nerve damage and irregular heartbeat and possibility to perform a PICC exchange if needed/ordered by physician.  Alternatives to this procedure were also discussed.  Bard Power PICC patient education guide, fact sheet on infection prevention and patient information card has been provided to patient /or left at bedside.    PICC Placement Documentation  PICC Double Lumen 12/23/23 Right Brachial 37 cm 0 cm (Active)  Indication for Insertion or Continuance of Line Vasoactive infusions 12/23/23 1607  Exposed Catheter (cm) 0 cm 12/23/23 1607  Site Assessment Clean, Dry, Intact 12/23/23 1607  Lumen #1 Status Flushed;Blood return noted;Saline locked 12/23/23 1607  Lumen #2 Status Flushed;Blood return noted;Saline locked 12/23/23 1607  Dressing Type Transparent 12/23/23 1607  Dressing Status Antimicrobial disc/dressing in place 12/23/23 1607  Line Care Connections checked and tightened 12/23/23 1607  Dressing Change Due 12/30/23 12/23/23 1607       Louvella Royalty 12/23/2023, 4:09 PM

## 2023-12-23 NOTE — Plan of Care (Signed)

## 2023-12-24 ENCOUNTER — Inpatient Hospital Stay (HOSPITAL_COMMUNITY)

## 2023-12-24 DIAGNOSIS — I5043 Acute on chronic combined systolic (congestive) and diastolic (congestive) heart failure: Secondary | ICD-10-CM | POA: Diagnosis not present

## 2023-12-24 LAB — BASIC METABOLIC PANEL WITH GFR
Anion gap: 6 (ref 5–15)
Anion gap: 11 (ref 5–15)
BUN: 14 mg/dL (ref 6–20)
BUN: 17 mg/dL (ref 6–20)
CO2: 29 mmol/L (ref 22–32)
CO2: 30 mmol/L (ref 22–32)
Calcium: 7.9 mg/dL — ABNORMAL LOW (ref 8.9–10.3)
Calcium: 8.3 mg/dL — ABNORMAL LOW (ref 8.9–10.3)
Chloride: 99 mmol/L (ref 98–111)
Chloride: 95 mmol/L — ABNORMAL LOW (ref 98–111)
Creatinine, Ser: 1.04 mg/dL — ABNORMAL HIGH (ref 0.44–1.00)
Creatinine, Ser: 1.17 mg/dL — ABNORMAL HIGH (ref 0.44–1.00)
GFR, Estimated: >60 mL/min (ref >60)
GFR, Estimated: >60 mL/min (ref >60)
Glucose, Bld: 245 mg/dL — ABNORMAL HIGH (ref 70–99)
Glucose, Bld: 98 mg/dL (ref 70–99)
Potassium: 5.5 mmol/L — ABNORMAL HIGH (ref 3.5–5.1)
Potassium: 4.7 mmol/L (ref 3.5–5.1)
Sodium: 134 mmol/L — ABNORMAL LOW (ref 135–145)
Sodium: 136 mmol/L (ref 135–145)

## 2023-12-24 LAB — CBC
HCT: 43.3 % (ref 36.0–46.0)
Hemoglobin: 14.4 g/dL (ref 12.0–15.0)
MCH: 30.6 pg (ref 26.0–34.0)
MCHC: 33.3 g/dL (ref 30.0–36.0)
MCV: 92.1 fL (ref 80.0–100.0)
Platelets: 308 10*3/uL (ref 150–400)
RBC: 4.7 MIL/uL (ref 3.87–5.11)
RDW: 13.3 % (ref 11.5–15.5)
WBC: 10.2 10*3/uL (ref 4.0–10.5)
nRBC: 0 % (ref 0.0–0.2)

## 2023-12-24 LAB — COOXEMETRY PANEL
Carboxyhemoglobin: 1.3 % (ref 0.5–1.5)
Methemoglobin: 0.9 % (ref 0.0–1.5)
O2 Saturation: 62.5 %
Total hemoglobin: 15.8 g/dL (ref 12.0–16.0)

## 2023-12-24 LAB — ECHOCARDIOGRAM COMPLETE
Est EF: <20
Height: 59 in
S' Lateral: 4.3 cm
Weight: 2134.05 [oz_av]

## 2023-12-24 LAB — GLUCOSE, CAPILLARY
Glucose-Capillary: 251 mg/dL — ABNORMAL HIGH (ref 70–99)
Glucose-Capillary: 75 mg/dL (ref 70–99)
Glucose-Capillary: 77 mg/dL (ref 70–99)
Glucose-Capillary: 140 mg/dL — ABNORMAL HIGH (ref 70–99)
Glucose-Capillary: 125 mg/dL — ABNORMAL HIGH (ref 70–99)

## 2023-12-24 LAB — DIGOXIN LEVEL: Digoxin Level: <0.4 ng/mL — ABNORMAL LOW (ref 0.8–2.0)

## 2023-12-24 LAB — LACTIC ACID, PLASMA: Lactic Acid, Venous: 1.7 mmol/L (ref 0.5–1.9)

## 2023-12-24 LAB — MAGNESIUM: Magnesium: 1.9 mg/dL (ref 1.7–2.4)

## 2023-12-24 MED ORDER — GABAPENTIN 100 MG PO CAPS
100.0000 mg | ORAL_CAPSULE | Freq: Three times a day (TID) | ORAL | Status: DC
Start: 2023-12-24 — End: 2023-12-25
  Administered 2023-12-24 – 2023-12-25 (×3): 100 mg via ORAL
  Filled 2023-12-24 (×3): qty 1

## 2023-12-24 MED ORDER — INSULIN ASPART 100 UNIT/ML IJ SOLN: 0.0000 [IU] | Freq: Three times a day (TID) | INTRAMUSCULAR | Status: DC

## 2023-12-24 MED ORDER — AMLODIPINE BESYLATE 5 MG PO TABS
5.0000 mg | ORAL_TABLET | Freq: Every day | ORAL | Status: DC
  Administered 2023-12-24 – 2023-12-25 (×2): 5 mg via ORAL
  Filled 2023-12-24 (×3): qty 1

## 2023-12-24 MED ORDER — ISOSORBIDE MONONITRATE ER 30 MG PO TB24: 30.0000 mg | ORAL_TABLET | Freq: Every day | ORAL | Status: DC

## 2023-12-24 MED ORDER — INSULIN ASPART 100 UNIT/ML IJ SOLN
8.0000 [IU] | Freq: Three times a day (TID) | INTRAMUSCULAR | Status: DC
  Administered 2023-12-24 – 2023-12-25 (×3): 8 [IU] via SUBCUTANEOUS

## 2023-12-24 MED ORDER — SPIRONOLACTONE 25 MG PO TABS
25.0000 mg | ORAL_TABLET | Freq: Every day | ORAL | Status: DC
  Administered 2023-12-25: 25 mg via ORAL
  Filled 2023-12-24: qty 1

## 2023-12-24 MED ORDER — HYDRALAZINE HCL 25 MG PO TABS: 25.0000 mg | ORAL_TABLET | Freq: Two times a day (BID) | ORAL | Status: DC

## 2023-12-24 MED ORDER — INSULIN ASPART 100 UNIT/ML IJ SOLN
0.0000 [IU] | Freq: Three times a day (TID) | INTRAMUSCULAR | Status: DC
  Administered 2023-12-24: 1 [IU] via SUBCUTANEOUS
  Administered 2023-12-25: 7 [IU] via SUBCUTANEOUS

## 2023-12-24 MED ORDER — MAGNESIUM SULFATE 2 GM/50ML IV SOLN
2.0000 g | Freq: Once | INTRAVENOUS | Status: AC
  Administered 2023-12-24: 2 g via INTRAVENOUS
  Filled 2023-12-24: qty 50

## 2023-12-24 NOTE — Progress Notes (Addendum)
 Advanced Heart Failure Rounding Note  Cardiologist: None  Chief Complaint: Acute Heart Failure Subjective:    3.3L in UOP yesterday, net negative 2L for the day.  SCr 1.04, K 5.5   PICC placed yesterday, initial Co-ox 59%. Repeat pending. No CVP set up   BPs remain elevated 140s/110s.   C/w exertional dyspnea and orthopnea. No current resting dyspnea w/ HOB elevated. Complains of leg crams and mid scapular pain (H/H stable).    Objective:    Weight Range: 60.5 kg Body mass index is 26.94 kg/m.   Vital Signs:   Temp:  [97.6 F (36.4 C)-98.1 F (36.7 C)] 97.6 F (36.4 C) (05/08 0749) Pulse Rate:  [95-107] 95 (05/08 0749) Resp:  [18-19] 19 (05/08 0749) BP: (126-153)/(95-110) 131/102 (05/08 0749) SpO2:  [100 %] 100 % (05/08 0749) Weight:  [60.5 kg] 60.5 kg (05/08 0651) Last BM Date : 12/23/23 Weight change: Filed Weights   12/22/23 1429 12/23/23 0531 12/24/23 0651  Weight: 62 kg 61.1 kg 60.5 kg   Intake/Output:  Intake/Output Summary (Last 24 hours) at 12/24/2023 0759 Last data filed at 12/24/2023 0542 Gross per 24 hour  Intake 1025.13 ml  Output 3300 ml  Net -2274.87 ml    Physical Exam    General:  fatigued appearing, no distress  HEENT: normal Neck: supple. JVD 8 cm  Cor: Regular rate & rhythm. No rubs, gallops or murmurs. Lungs: decreased BS at the bases bilaterally  Abdomen: soft, nontender, nondistended. No hepatosplenomegaly. No bruits or masses. Good bowel sounds. Extremities: no cyanosis, clubbing, rash, no edema Neuro: alert & oriented x 3, cranial nerves grossly intact. moves all 4 extremities w/o difficulty. Affect pleasant.   Telemetry   NSR 90s (personally reviewed)  EKG    N/A   Labs    CBC Recent Labs    12/22/23 1455 12/24/23 0500  WBC 11.4* 10.2  HGB 14.3 14.4  HCT 41.4 43.3  MCV 89.6 92.1  PLT 310 308   Basic Metabolic Panel Recent Labs    46/96/29 0238 12/23/23 1331 12/24/23 0500  NA 133* 138 134*  K 2.9* 3.4*  5.5*  CL 90* 95* 99  CO2 29 31 29   GLUCOSE 390* 160* 245*  BUN 12 9 14   CREATININE 1.05* 0.99 1.04*  CALCIUM  8.3* 8.4* 7.9*  MG 1.2*  --  1.9   Liver Function Tests Recent Labs    12/22/23 1455  AST 36  ALT 13  ALKPHOS 124  BILITOT 0.9  PROT 6.8  ALBUMIN 2.1*   BNP (last 3 results) Recent Labs    03/06/23 0916 11/03/23 1718 12/22/23 1455  BNP 382.8* 1,882.2* 1,451.1*   ProBNP (last 3 results) Recent Labs    12/03/23 1235  PROBNP 1,145.0*   Imaging   US  EKG SITE RITE Result Date: 12/23/2023 If Site Rite image not attached, placement could not be confirmed due to current cardiac rhythm.  Medications:    Scheduled Medications:  atorvastatin   40 mg Oral Daily   Chlorhexidine  Gluconate Cloth  6 each Topical Daily   digoxin   0.125 mg Oral Daily   furosemide   80 mg Intravenous BID   heparin   5,000 Units Subcutaneous Q8H   insulin  aspart  0-20 Units Subcutaneous TID WC   insulin  aspart  0-5 Units Subcutaneous QHS   insulin  glargine-yfgn  35 Units Subcutaneous QHS   melatonin  3 mg Oral QHS   sacubitril -valsartan   1 tablet Oral BID   sodium chloride  flush  10-40 mL  Intracatheter Q12H   sodium chloride  flush  3 mL Intravenous Q12H   spironolactone   50 mg Oral Daily   Infusions:  magnesium  sulfate bolus IVPB     PRN Medications: acetaminophen , sodium chloride  flush, sodium chloride  flush, traMADol   Patient Profile   38 y.o. Hispanic female with chronic systolic heart failure, h/o PE, migraines, hypertension, poorly controlled type 2 diabetes mellitus, poor compliance and ETOH use, admitted for a/c CHF w/ marked volume overload.   Assessment/Plan   1. Acute on Chronic biventricular systolic heart failure - Echo (10/23): EF <20%, RV severely reduced. Mild concentric LVH, LV with GHK, smoke seen in LA, RA severely dilated, mild MR - Echo (7/24): EF < 20%, RV moderately reduced  - Suspect CM 2/2 uncontrolled hypertension, ETOH, medication noncompliance and DM -  admitted w/ NYHA Class IIIb symptoms + marked volume overload, in setting of poor med compliance - Volume appears improved on exam. Remains mildly volume overloaded. Has PICC. Will order CVPs to assess volume status  - Continue IV Lasix   - Co-ox pending. Repeat LA pending (2.6 yesterday)  - Continue Entresto  97/103 mg bid - On Spiro 50 mg. Will hold AM dose given hyperkalemia (overshot KCl supplementation), restart once resolved  - Start Amlodipine 5 mg for additional afterload reduction  - continue digoxin  0.125 mg daily - Hold Coreg  until fully diuresed  - No SGLT2i with uncontrolled T2DM, A1c 12.9 - not candidate for ICD or advanced therapies with noncompliance - Echo pending    2. HTN - remains elevated - Maxed out on Entresto  and Spiro - add amlodipine 5 mg     3. DM II, Poorly Controlled  - Off SGLT2i with A1c 13.5 > 12.9 - Insulin  ordered - diabetes coordinator consulted, appreciate assistance    4. H/o PE w/ pulmonary infarct - treated with eliquis  x 6 months 4/21   5. Alcohol use - still drinking some. Likely underreporting. - cessation advised     6. HLD - Recent LP (4/25) LDL 112.  Goal in setting of DM < 70  - continue atorv 40 mg daily - repeat LFTs and Lipids in 6-8 weeks  7. Hypokalemia/Hyperkalemia  - Hypokalemic yesterday. Now hyperkalemic after aggressive K sup (5.5) - received dose of IV Lasix  this morning, and obtain f/u BMP at noon to ensure corrected  - hold AM dose of spiro   Length of Stay: 2  Ruddy Corral, PA-C  12/24/2023, 7:59 AM  Advanced Heart Failure Team Pager [phone] (M-F; 7a - 5p)  Please contact [facility] for night-coverage after hours (5p -7a ) and weekends on [email or web address]  Patient seen and examined with the above-signed Advanced Practice Provider and/or Housestaff. I personally reviewed laboratory data, imaging studies and relevant notes. I independently examined the patient and formulated the important aspects of the plan. I  have edited the note to reflect any of my changes or salient points. I have personally discussed the plan with the patient and/or family.  Feeling much better with diuresis. Denies orthopnea or PND. BP improving. Renal function stable but K high after supp  General:  Sitting up in bed No resp difficulty HEENT: normal Neck: supple.JVP 8 Carotids 2+ bilat; no bruits. No lymphadenopathy or thryomegaly appreciated. Cor: PMI nondisplaced. Regular rate & rhythm. No rubs, gallops or murmurs. Lungs: clear Abdomen: soft, nontender, nondistended. No hepatosplenomegaly. No bruits or masses. Good bowel sounds. Extremities: no cyanosis, clubbing, rash, 1+ edema Neuro: alert & orientedx3, cranial nerves grossly intact. moves all 4  extremities w/o difficulty. Affect pleasant  Still with some volume on board. Continue IV diuresis. Hold spiro with elevated K. Add amlodipine for HTN. Place TED hose.   Discussed need to limit fluid intake and ambulate more.   Jules Oar, MD  1:58 PM

## 2023-12-24 NOTE — Plan of Care (Signed)

## 2023-12-24 NOTE — Progress Notes (Signed)
 1550: Patient c/o feeling dizzy and showed this RN her glucometer reading of 45 at 2:45 pm today. This RN checked CBG with hospital glucometer and resulted 77 at this time. Where pt is symptomatic, provided 8 oz apple juice. Education provided as to symptoms of hypoglycemia. Pt verbalized understanding.

## 2023-12-24 NOTE — Inpatient Diabetes Management (Signed)
 Inpatient Diabetes Program Recommendations  AACE/ADA: New Consensus Statement on Inpatient Glycemic Control (2015)  Target Ranges:  Prepandial:   less than 140 mg/dL      Peak postprandial:   less than 180 mg/dL (1-2 hours)      Critically ill patients:  140 - 180 mg/dL   Lab Results  Component Value Date   GLUCAP 75 12/24/2023   HGBA1C 12.9 (H) 12/03/2023    Latest Reference Range & Units 12/23/23 06:09 12/23/23 11:24 12/23/23 16:26 12/23/23 22:09 12/24/23 05:51 12/24/23 11:04  Glucose-Capillary 70 - 99 mg/dL 829 (H) 562 (H) 130 (H) 127 (H) 251 (H) 75  (H): Data is abnormally high  Diabetes history: DM2 Outpatient Diabetes medications: Lantus  40 units daily, Humalog  2-15 units tid Current orders for Inpatient glycemic control: Semglee  35 units daily, Novolog  0-20 units tid, 0-5 units hs  Inpatient Diabetes Program Recommendations:   Novolog  correction decreased to 0-9 units tid, 0-5 units hs MD ordered application of Freestyle CGM at discharge for patient. Education done regarding application and changing CGM sensor (alternate every 15 days on back of arms), 1 hour warm-up, use of glucometer when alert displays.Patient has also been given educational packet regarding use CGM sensor including the 1-800 toll free number for any questions, problems or needs related to the [facility] sensors or reader.    Sensor applied by patient to (L) Arm at 1:30 pm.  Explained that glucose readings will not be available until 1 hour after application. Reviewed use of CGM including how to scan, changing Sensor, Vitamin C warning, arrows with glucose readings, and Freestyle app. Patient very appreciative. Gave patient 2 additional sensors to take home.  Thank you, Maecie Sevcik E. Yohan Samons, RN, MSN, CDCES  Diabetes Coordinator Inpatient Glycemic Control Team Team Pager [phone] (8am-5pm) 12/24/2023 2:20 PM

## 2023-12-25 ENCOUNTER — Telehealth: Payer: Self-pay | Admitting: *Deleted

## 2023-12-25 ENCOUNTER — Other Ambulatory Visit (HOSPITAL_COMMUNITY): Payer: Self-pay

## 2023-12-25 DIAGNOSIS — I5043 Acute on chronic combined systolic (congestive) and diastolic (congestive) heart failure: Secondary | ICD-10-CM | POA: Diagnosis not present

## 2023-12-25 LAB — COOXEMETRY PANEL
Carboxyhemoglobin: 1.9 % — ABNORMAL HIGH (ref 0.5–1.5)
Methemoglobin: 0.9 % (ref 0.0–1.5)
O2 Saturation: 63.2 %
Total hemoglobin: 15.2 g/dL (ref 12.0–16.0)

## 2023-12-25 LAB — BASIC METABOLIC PANEL WITH GFR
Anion gap: 10 (ref 5–15)
BUN: 19 mg/dL (ref 6–20)
CO2: 29 mmol/L (ref 22–32)
Calcium: 7.8 mg/dL — ABNORMAL LOW (ref 8.9–10.3)
Chloride: 92 mmol/L — ABNORMAL LOW (ref 98–111)
Creatinine, Ser: 1.15 mg/dL — ABNORMAL HIGH (ref 0.44–1.00)
GFR, Estimated: >60 mL/min (ref >60)
Glucose, Bld: 274 mg/dL — ABNORMAL HIGH (ref 70–99)
Potassium: 4.5 mmol/L (ref 3.5–5.1)
Sodium: 131 mmol/L — ABNORMAL LOW (ref 135–145)

## 2023-12-25 LAB — MAGNESIUM: Magnesium: 2.1 mg/dL (ref 1.7–2.4)

## 2023-12-25 LAB — GLUCOSE, CAPILLARY
Glucose-Capillary: 316 mg/dL — ABNORMAL HIGH (ref 70–99)
Glucose-Capillary: 81 mg/dL (ref 70–99)
Glucose-Capillary: 74 mg/dL (ref 70–99)

## 2023-12-25 MED ORDER — ATORVASTATIN CALCIUM 40 MG PO TABS
40.0000 mg | ORAL_TABLET | Freq: Every day | ORAL | 4 refills | Status: AC
  Filled 2023-12-25: qty 90, 90d supply, fill #0

## 2023-12-25 MED ORDER — FUROSEMIDE 40 MG PO TABS: 40.0000 mg | ORAL_TABLET | Freq: Every day | ORAL | Status: DC

## 2023-12-25 MED ORDER — INSULIN GLARGINE 100 UNIT/ML SOLOSTAR PEN
35.0000 [IU] | PEN_INJECTOR | Freq: Every day | SUBCUTANEOUS | 0 refills | Status: AC
Start: 2023-12-25 — End: ?
  Filled 2023-12-25: qty 15, 42d supply, fill #0

## 2023-12-25 MED ORDER — TORSEMIDE 20 MG PO TABS: 40.0000 mg | ORAL_TABLET | Freq: Every day | ORAL | Status: DC

## 2023-12-25 MED ORDER — DIGOXIN 125 MCG PO TABS
0.1250 mg | ORAL_TABLET | Freq: Every day | ORAL | 4 refills | Status: AC
  Filled 2023-12-25: qty 90, 90d supply, fill #0

## 2023-12-25 MED ORDER — SACUBITRIL-VALSARTAN 97-103 MG PO TABS
1.0000 | ORAL_TABLET | Freq: Two times a day (BID) | ORAL | 4 refills | Status: DC
  Filled 2023-12-25: qty 180, 90d supply, fill #0

## 2023-12-25 MED ORDER — TORSEMIDE 20 MG PO TABS
40.0000 mg | ORAL_TABLET | Freq: Every day | ORAL | 4 refills | Status: DC
  Filled 2023-12-25: qty 180, 90d supply, fill #0

## 2023-12-25 MED ORDER — SPIRONOLACTONE 25 MG PO TABS
25.0000 mg | ORAL_TABLET | Freq: Every day | ORAL | 4 refills | Status: AC
  Filled 2023-12-25: qty 90, 90d supply, fill #0

## 2023-12-25 MED ORDER — AMLODIPINE BESYLATE 5 MG PO TABS
5.0000 mg | ORAL_TABLET | Freq: Every day | ORAL | 4 refills | Status: DC
  Filled 2023-12-25: qty 90, 90d supply, fill #0

## 2023-12-25 NOTE — TOC Transition Note (Signed)
 Transition of Care [facility]) - Discharge Note   Patient Details  Name: Cheryl Mercer MRN: [id number] Date of Birth: 25-Sep-1985  Transition of Care [facility]) CM/SW Contact:  Jennett Model, RN Phone Number: 12/25/2023, 2:53 PM   Clinical Narrative:    For dc today, has follow up apt on [facility], has transportation.      Barriers to Discharge: Continued Medical Work up   Patient Goals and CMS Choice            Discharge Placement                       Discharge Plan and Services Additional resources added to the After Visit Summary for                                       Social Drivers of Health (SDOH) Interventions SDOH Screenings   Food Insecurity: Food Insecurity Present (12/22/2023)  Housing: Low Risk  (12/22/2023)  Transportation Needs: No Transportation Needs (12/22/2023)  Utilities: At Risk (12/22/2023)  Alcohol Screen: Low Risk  (12/21/2023)  Depression (PHQ2-9): High Risk (12/21/2023)  Financial Resource Strain: High Risk (12/21/2023)  Physical Activity: Insufficiently Active (12/21/2023)  Social Connections: Unknown (12/21/2023)  Stress: No Stress Concern Present (12/21/2023)  Tobacco Use: Medium Risk (12/22/2023)  Health Literacy: Adequate Health Literacy (12/21/2023)     Readmission Risk Interventions     No data to display

## 2023-12-25 NOTE — Progress Notes (Addendum)
 Advanced Heart Failure Rounding Note  Cardiologist: None  Chief Complaint: Acute Heart Failure Subjective:    3.2L in UOP yesterday. Volume status improved on exam. CVP 1.   Co-ox 63% BMP pending   SBPs improving, 120s/90s   Breathing has improved. Complains of mild positional dizziness but no syncope/ near syncope.   Echo yesterday EF <20%, GIIIDD, RV nl. IVC normal in size w/ > 50% respiratory variability, assumed RAP ~3 mmhg    Objective:    Weight Range: 60.3 kg Body mass index is 26.85 kg/m.   Vital Signs:   Temp:  [97.6 F (36.4 C)-98.3 F (36.8 C)] 98.1 F (36.7 C) (05/09 0716) Pulse Rate:  [96-105] 96 (05/09 0716) Resp:  [17-19] 18 (05/09 0716) BP: (104-142)/(74-104) 128/90 (05/09 0716) SpO2:  [98 %-100 %] 100 % (05/09 0716) Weight:  [60.3 kg] 60.3 kg (05/09 0441) Last BM Date : 12/24/23 Weight change: Filed Weights   12/23/23 0531 12/24/23 0651 12/25/23 0441  Weight: 61.1 kg 60.5 kg 60.3 kg   Intake/Output:  Intake/Output Summary (Last 24 hours) at 12/25/2023 0755 Last data filed at 12/25/2023 0641 Gross per 24 hour  Intake 463 ml  Output 3150 ml  Net -2687 ml    Physical Exam    General:  fatigued appearing. No distress  HEENT: normal Neck: supple. JVD not elevated.  Cor: PMI nondisplaced. Regular rate & rhythm. No rubs, gallops or murmurs. Lungs: clear Abdomen: soft, nontender, nondistended. Extremities: no cyanosis, clubbing, rash, edema Neuro: alert & oriented x 3, cranial nerves grossly intact. moves all 4 extremities w/o difficulty. Affect flat   Telemetry   NSR 90s (personally reviewed)  EKG    N/A   Labs    CBC Recent Labs    12/22/23 1455 12/24/23 0500  WBC 11.4* 10.2  HGB 14.3 14.4  HCT 41.4 43.3  MCV 89.6 92.1  PLT 310 308   Basic Metabolic Panel Recent Labs    19/14/78 0238 12/23/23 1331 12/24/23 0500 12/24/23 2046  NA 133*   < > 134* 136  K 2.9*   < > 5.5* 4.7  CL 90*   < > 99 95*  CO2 29   < > 29 30   GLUCOSE 390*   < > 245* 98  BUN 12   < > 14 17  CREATININE 1.05*   < > 1.04* 1.17*  CALCIUM  8.3*   < > 7.9* 8.3*  MG 1.2*  --  1.9  --    < > = values in this interval not displayed.   Liver Function Tests Recent Labs    12/22/23 1455  AST 36  ALT 13  ALKPHOS 124  BILITOT 0.9  PROT 6.8  ALBUMIN 2.1*   BNP (last 3 results) Recent Labs    03/06/23 0916 11/03/23 1718 12/22/23 1455  BNP 382.8* 1,882.2* 1,451.1*   ProBNP (last 3 results) Recent Labs    12/03/23 1235  PROBNP 1,145.0*   Imaging   ECHOCARDIOGRAM COMPLETE Result Date: 12/24/2023    ECHOCARDIOGRAM REPORT   Patient Name:   Cheryl Mercer Date of Exam: 12/24/2023 Medical Rec #:  [id number]              Height:       59.0 in Accession #:    [id number]             Weight:       133.4 lb Date of Birth:  [birth year]  BSA:          1.553 m Patient Age:    37 years               BP:           121/87 mmHg Patient Gender: F                      HR:           95 bpm. Exam Location:  Inpatient Procedure: 2D Echo, Cardiac Doppler and Color Doppler (Both Spectral and Color            Flow Doppler were utilized during procedure). Indications:    CHF  History:        Patient has prior history of Echocardiogram examinations, most                 recent 02/23/2023. CHF and Cardiomyopathy; Risk                 Factors:Hypertension and Dyslipidemia.  Sonographer:    Janette Medley Referring Phys: [id number] BRITTAINY M SIMMONS IMPRESSIONS  1. Left ventricular ejection fraction, by estimation, is <20%. The left ventricle has severely decreased function. The left ventricle demonstrates global hypokinesis. Left ventricular diastolic parameters are consistent with Grade III diastolic dysfunction (restrictive).  2. Right ventricular systolic function is normal. The right ventricular size is normal.  3. The mitral valve is normal in structure. No evidence of mitral valve regurgitation. No evidence of mitral stenosis.  4. The aortic valve is  tricuspid. There is mild calcification of the aortic valve. There is mild thickening of the aortic valve. Aortic valve regurgitation is not visualized. No aortic stenosis is present.  5. The inferior vena cava is normal in size with greater than 50% respiratory variability, suggesting right atrial pressure of 3 mmHg. FINDINGS  Left Ventricle: Left ventricular ejection fraction, by estimation, is <20%. The left ventricle has severely decreased function. The left ventricle demonstrates global hypokinesis. The left ventricular internal cavity size was normal in size. There is no  left ventricular hypertrophy. Left ventricular diastolic parameters are consistent with Grade III diastolic dysfunction (restrictive). Right Ventricle: The right ventricular size is normal. No increase in right ventricular wall thickness. Right ventricular systolic function is normal. Left Atrium: Left atrial size was normal in size. Right Atrium: Right atrial size was normal in size. Pericardium: There is no evidence of pericardial effusion. Mitral Valve: The mitral valve is normal in structure. No evidence of mitral valve regurgitation. No evidence of mitral valve stenosis. Tricuspid Valve: The tricuspid valve is normal in structure. Tricuspid valve regurgitation is not demonstrated. No evidence of tricuspid stenosis. Aortic Valve: The aortic valve is tricuspid. There is mild calcification of the aortic valve. There is mild thickening of the aortic valve. Aortic valve regurgitation is not visualized. No aortic stenosis is present. Pulmonic Valve: The pulmonic valve was normal in structure. Pulmonic valve regurgitation is not visualized. No evidence of pulmonic stenosis. Aorta: The aortic root is normal in size and structure. Venous: The inferior vena cava is normal in size with greater than 50% respiratory variability, suggesting right atrial pressure of 3 mmHg. IAS/Shunts: No atrial level shunt detected by color flow Doppler.  LEFT  VENTRICLE PLAX 2D LVIDd:         5.00 cm LVIDs:         4.30 cm LV PW:         1.50 cm LV IVS:  0.85 cm LVOT diam:     2.10 cm LV SV:         32 LV SV Index:   21 LVOT Area:     3.46 cm  RIGHT VENTRICLE            IVC RV S prime:     5.77 cm/s  IVC diam: 1.20 cm TAPSE (M-mode): 1.9 cm LEFT ATRIUM             Index        RIGHT ATRIUM           Index LA diam:        3.70 cm 2.38 cm/m   RA Area:     13.30 cm LA Vol (A2C):   58.9 ml 37.94 ml/m  RA Volume:   31.40 ml  20.22 ml/m LA Vol (A4C):   31.0 ml 19.97 ml/m LA Biplane Vol: 44.5 ml 28.66 ml/m  AORTIC VALVE LVOT Vmax:   58.90 cm/s LVOT Vmean:  43.600 cm/s LVOT VTI:    0.093 m  AORTA Ao Root diam: 2.50 cm  SHUNTS Systemic VTI:  0.09 m Systemic Diam: 2.10 cm Maudine Sos MD Electronically signed by Maudine Sos MD Signature Date/Time: 12/24/2023/5:21:27 PM    Final    Medications:    Scheduled Medications:  amLODipine   5 mg Oral Daily   atorvastatin   40 mg Oral Daily   Chlorhexidine  Gluconate Cloth  6 each Topical Daily   digoxin   0.125 mg Oral Daily   furosemide   80 mg Intravenous BID   gabapentin   100 mg Oral TID   heparin   5,000 Units Subcutaneous Q8H   insulin  aspart  0-5 Units Subcutaneous QHS   insulin  aspart  0-9 Units Subcutaneous TID WC   insulin  aspart  8 Units Subcutaneous TID WC   insulin  glargine-yfgn  35 Units Subcutaneous QHS   melatonin  3 mg Oral QHS   sacubitril -valsartan   1 tablet Oral BID   sodium chloride  flush  10-40 mL Intracatheter Q12H   sodium chloride  flush  3 mL Intravenous Q12H   spironolactone   25 mg Oral Daily   Infusions:   PRN Medications: acetaminophen , sodium chloride  flush, sodium chloride  flush, traMADol   Patient Profile   38 y.o. Hispanic female with chronic systolic heart failure, h/o PE, migraines, hypertension, poorly controlled type 2 diabetes mellitus, poor compliance and ETOH use, admitted for a/c CHF w/ marked volume overload.   Assessment/Plan   1. Acute on Chronic  biventricular systolic heart failure - Echo (10/23): EF <20%, RV severely reduced. Mild concentric LVH, LV with GHK, smoke seen in LA, RA severely dilated, mild MR - Echo (7/24): EF < 20%, RV moderately reduced  - Suspect CM 2/2 uncontrolled hypertension, ETOH, medication noncompliance and DM - admitted w/ NYHA Class IIIb symptoms + marked volume overload, in setting of poor med compliance - Echo this admit EF < 20%, GIIIDD (likely from HTN), RV nl IVC small, assumed RAP 3  - Volume appears improved on exam. CVP 1 today  - Stop IV diuretics and await BMP. Likely place back on PO Lasix  later today vs tomorrow pending SCr   - Co-ox 63%  - Continue Entresto  97/103 mg bid - Continue Spiro 25 mg daily   - Continue Amlodipine  5 mg  - continue digoxin  0.125 mg daily - Hold Coreg  until fully diuresed  - No SGLT2i with uncontrolled T2DM, A1c 12.9 - not candidate for ICD or advanced therapies with noncompliance   2.  HTN - improving  - continue Entresto , Spiro and Amlodipine  per above   3. DM II, Poorly Controlled  - Off SGLT2i with A1c 13.5 > 12.9 - diabetes coordinator consulted, insulin  regimen adjusted. Appreciate assistance  - on statin    4. H/o PE w/ pulmonary infarct - treated with eliquis  x 6 months 4/21   5. Alcohol use - still drinking some. Likely underreporting. - cessation advised     6. HLD - Recent LP (4/25) LDL 112.  Goal in setting of DM < 70  - continue atorv 40 mg daily - repeat LFTs and Lipids in 6-8 weeks  7. Hypokalemia/Hyperkalemia  - BMP pending   Length of Stay: 3  Ruddy Corral, PA-C  12/25/2023, 7:55 AM  Advanced Heart Failure Team Pager [phone] (M-F; 7a - 5p)  Please contact [facility] for night-coverage after hours (5p -7a ) and weekends on [email or web address]   Patient seen and examined with the above-signed Advanced Practice Provider and/or Housestaff. I personally reviewed laboratory data, imaging studies and relevant notes. I independently  examined the patient and formulated the important aspects of the plan. I have edited the note to reflect any of my changes or salient points. I have personally discussed the plan with the patient and/or family.  Feeling much better. Has diuresed well. Volume status nad BP much improved.   Denies CP or SOB. No orthopena or PND  General:  Well appearing. No resp difficulty HEENT: normal Neck: supple. no JVD. Carotids 2+ bilat; no bruits. No lymphadenopathy or thryomegaly appreciated. Cor: PMI nondisplaced. Regular rate & rhythm. No rubs, gallops or murmurs. Lungs: clear Abdomen: soft, nontender, nondistended. No hepatosplenomegaly. No bruits or masses. Good bowel sounds. Extremities: no cyanosis, clubbing, rash, edema Neuro: alert & orientedx3, cranial nerves grossly intact. moves all 4 extremities w/o difficulty. Affect pleasant  Much improved. Ok for d/c today on current meds. Long discussion about need for closer HF monitoring and DM2 control.   D/w PharmD.   Jules Oar, MD  6:52 PM

## 2023-12-25 NOTE — Plan of Care (Signed)
   Problem: Activity: Goal: Risk for activity intolerance will decrease Outcome: Progressing   Problem: Nutrition: Goal: Adequate nutrition will be maintained Outcome: Progressing

## 2023-12-25 NOTE — Plan of Care (Signed)
  Problem: Education: Goal: Knowledge of General Education information will improve Description: Including pain rating scale, medication(s)/side effects and non-pharmacologic comfort measures Outcome: Adequate for Discharge   Problem: Health Behavior/Discharge Planning: Goal: Ability to manage health-related needs will improve Outcome: Adequate for Discharge   Problem: Clinical Measurements: Goal: Ability to maintain clinical measurements within normal limits will improve Outcome: Adequate for Discharge Goal: Will remain free from infection Outcome: Adequate for Discharge Goal: Diagnostic test results will improve Outcome: Adequate for Discharge Goal: Respiratory complications will improve Outcome: Adequate for Discharge Goal: Cardiovascular complication will be avoided Outcome: Adequate for Discharge   Problem: Activity: Goal: Risk for activity intolerance will decrease 12/25/2023 1445 by Brenton Cambridge, RN Outcome: Adequate for Discharge 12/25/2023 0841 by Brenton Cambridge, RN Outcome: Progressing   Problem: Nutrition: Goal: Adequate nutrition will be maintained 12/25/2023 1445 by Brenton Cambridge, RN Outcome: Adequate for Discharge 12/25/2023 0841 by Brenton Cambridge, RN Outcome: Progressing   Problem: Coping: Goal: Level of anxiety will decrease Outcome: Adequate for Discharge   Problem: Elimination: Goal: Will not experience complications related to bowel motility Outcome: Adequate for Discharge Goal: Will not experience complications related to urinary retention Outcome: Adequate for Discharge   Problem: Pain Managment: Goal: General experience of comfort will improve and/or be controlled Outcome: Adequate for Discharge   Problem: Safety: Goal: Ability to remain free from injury will improve Outcome: Adequate for Discharge   Problem: Skin Integrity: Goal: Risk for impaired skin integrity will decrease Outcome: Adequate for Discharge   Problem:  Education: Goal: Ability to describe self-care measures that may prevent or decrease complications (Diabetes Survival Skills Education) will improve Outcome: Adequate for Discharge Goal: Individualized Educational Video(s) Outcome: Adequate for Discharge   Problem: Coping: Goal: Ability to adjust to condition or change in health will improve Outcome: Adequate for Discharge   Problem: Fluid Volume: Goal: Ability to maintain a balanced intake and output will improve Outcome: Adequate for Discharge   Problem: Health Behavior/Discharge Planning: Goal: Ability to identify and utilize available resources and services will improve Outcome: Adequate for Discharge Goal: Ability to manage health-related needs will improve Outcome: Adequate for Discharge   Problem: Metabolic: Goal: Ability to maintain appropriate glucose levels will improve Outcome: Adequate for Discharge   Problem: Nutritional: Goal: Maintenance of adequate nutrition will improve Outcome: Adequate for Discharge Goal: Progress toward achieving an optimal weight will improve Outcome: Adequate for Discharge   Problem: Skin Integrity: Goal: Risk for impaired skin integrity will decrease Outcome: Adequate for Discharge   Problem: Tissue Perfusion: Goal: Adequacy of tissue perfusion will improve Outcome: Adequate for Discharge

## 2023-12-25 NOTE — Discharge Summary (Addendum)
 Advanced Heart Failure Team  Discharge Summary   Patient ID: Cheryl Mercer MRN: [id number], DOB/AGE: August 27, 1985 38 y.o. Admit date: 12/22/2023 D/C date:     12/25/2023   Primary Discharge Diagnoses:  Acute on Chronic Biventricular Heart Failure HTN T2DM  Secondary Discharge Diagnoses:  H/o PE ETOH use HLD  Hospital Course:   Cheryl Mercer is a 38 y.o. Hispanic female with chronic systolic heart failure, h/o PE, migraines, hypertension, poorly controlled type 2 diabetes mellitus, poor compliance and ETOH use.  Directly admitted from [facility] Clinic for hyperglycemia and CHF exacerbation in the setting of medication noncompliance. She was diuresed with IV Lasix  and slowly started on GDMT. Course complicated by hypertension and hyperglycemia, improved with medical management.   Hospital Course by Problem List:  1. Acute on Chronic biventricular systolic heart failure - Suspect CM 2/2 uncontrolled hypertension, ETOH, medication noncompliance and DM - admitted w/ NYHA Class IIIb symptoms + marked volume overload, in setting of poor med compliance - Echo this admit EF < 20%, GIIIDD (likely from HTN), RV nl IVC small, assumed RAP 3  - Euvolemic on discharge.  - Start Torsemide  40 mg daily at discharge. - Continue Entresto  97/103 mg bid - Continue Spiro 25 mg daily   - Continue Amlodipine  5 mg  - Continue digoxin  0.125 mg daily - Add coreg  next in OP. - No SGLT2i with uncontrolled T2DM, A1c 12.9 - not candidate for ICD or advanced therapies with noncompliance   2. HTN - resolved with GDMT + norvasc    3. DM II, Poorly Controlled  - Off SGLT2i with A1c 13.5 > 12.9 - insulin  dosing per DM coordinator.    4. H/o PE w/ pulmonary infarct - treated with eliquis  x 6 months 4/21   5. Alcohol use - still drinking some. Likely underreporting. - cessation advised     6. HLD - Recent LP (4/25) LDL 112.  Goal in setting of DM < 70  - continue atorv 40 mg daily - repeat LFTs  and Lipids in 6-8 weeks   Discharge Weight: 133 lbs Discharge Vitals: Blood pressure 97/70, pulse 100, temperature 97.7 F (36.5 C), temperature source Oral, resp. rate 18, height 4\' 11"  (1.499 m), weight 60.3 kg, SpO2 100%.  Labs: Lab Results  Component Value Date   WBC 10.2 12/24/2023   HGB 14.4 12/24/2023   HCT 43.3 12/24/2023   MCV 92.1 12/24/2023   PLT 308 12/24/2023    Recent Labs  Lab 12/22/23 1455 12/23/23 0238 12/25/23 0812  NA 139   < > 131*  K 2.8*   < > 4.5  CL 95*   < > 92*  CO2 30   < > 29  BUN 10   < > 19  CREATININE 1.05*   < > 1.15*  CALCIUM  8.7*   < > 7.8*  PROT 6.8  --   --   BILITOT 0.9  --   --   ALKPHOS 124  --   --   ALT 13  --   --   AST 36  --   --   GLUCOSE 149*   < > 274*   < > = values in this interval not displayed.   Lab Results  Component Value Date   CHOL 189 12/03/2023   HDL 49.30 12/03/2023   LDLCALC 112 (H) 12/03/2023   TRIG 138.0 12/03/2023   BNP (last 3 results) Recent Labs    03/06/23 0916 11/03/23 1718 12/22/23 1455  BNP 382.8* 1,882.2*  1,451.1*    ProBNP (last 3 results) Recent Labs    12/03/23 1235  PROBNP 1,145.0*     Diagnostic Studies/Procedures   ECHOCARDIOGRAM COMPLETE Result Date: 12/24/2023    ECHOCARDIOGRAM REPORT   Patient Name:   Cheryl Mercer Date of Exam: 12/24/2023 Medical Rec #:  [id number]              Height:       59.0 in Accession #:    [id number]             Weight:       133.4 lb Date of Birth:  08-Oct-1985             BSA:          1.553 m Patient Age:    38 years               BP:           121/87 mmHg Patient Gender: F                      HR:           95 bpm. Exam Location:  Inpatient Procedure: 2D Echo, Cardiac Doppler and Color Doppler (Both Spectral and Color            Flow Doppler were utilized during procedure). Indications:    CHF  History:        Patient has prior history of Echocardiogram examinations, most                 recent 02/23/2023. CHF and Cardiomyopathy; Risk                  Factors:Hypertension and Dyslipidemia.  Sonographer:    Janette Medley Referring Phys: 20 BRITTAINY M SIMMONS IMPRESSIONS  1. Left ventricular ejection fraction, by estimation, is <20%. The left ventricle has severely decreased function. The left ventricle demonstrates global hypokinesis. Left ventricular diastolic parameters are consistent with Grade III diastolic dysfunction (restrictive).  2. Right ventricular systolic function is normal. The right ventricular size is normal.  3. The mitral valve is normal in structure. No evidence of mitral valve regurgitation. No evidence of mitral stenosis.  4. The aortic valve is tricuspid. There is mild calcification of the aortic valve. There is mild thickening of the aortic valve. Aortic valve regurgitation is not visualized. No aortic stenosis is present.  5. The inferior vena cava is normal in size with greater than 50% respiratory variability, suggesting right atrial pressure of 3 mmHg. FINDINGS  Left Ventricle: Left ventricular ejection fraction, by estimation, is <20%. The left ventricle has severely decreased function. The left ventricle demonstrates global hypokinesis. The left ventricular internal cavity size was normal in size. There is no  left ventricular hypertrophy. Left ventricular diastolic parameters are consistent with Grade III diastolic dysfunction (restrictive). Right Ventricle: The right ventricular size is normal. No increase in right ventricular wall thickness. Right ventricular systolic function is normal. Left Atrium: Left atrial size was normal in size. Right Atrium: Right atrial size was normal in size. Pericardium: There is no evidence of pericardial effusion. Mitral Valve: The mitral valve is normal in structure. No evidence of mitral valve regurgitation. No evidence of mitral valve stenosis. Tricuspid Valve: The tricuspid valve is normal in structure. Tricuspid valve regurgitation is not demonstrated. No evidence of tricuspid stenosis.  Aortic Valve: The aortic valve is tricuspid. There is mild calcification of the aortic valve. There is  mild thickening of the aortic valve. Aortic valve regurgitation is not visualized. No aortic stenosis is present. Pulmonic Valve: The pulmonic valve was normal in structure. Pulmonic valve regurgitation is not visualized. No evidence of pulmonic stenosis. Aorta: The aortic root is normal in size and structure. Venous: The inferior vena cava is normal in size with greater than 50% respiratory variability, suggesting right atrial pressure of 3 mmHg. IAS/Shunts: No atrial level shunt detected by color flow Doppler.  LEFT VENTRICLE PLAX 2D LVIDd:         5.00 cm LVIDs:         4.30 cm LV PW:         1.50 cm LV IVS:        0.85 cm LVOT diam:     2.10 cm LV SV:         32 LV SV Index:   21 LVOT Area:     3.46 cm  RIGHT VENTRICLE            IVC RV S prime:     5.77 cm/s  IVC diam: 1.20 cm TAPSE (M-mode): 1.9 cm LEFT ATRIUM             Index        RIGHT ATRIUM           Index LA diam:        3.70 cm 2.38 cm/m   RA Area:     13.30 cm LA Vol (A2C):   58.9 ml 37.94 ml/m  RA Volume:   31.40 ml  20.22 ml/m LA Vol (A4C):   31.0 ml 19.97 ml/m LA Biplane Vol: 44.5 ml 28.66 ml/m  AORTIC VALVE LVOT Vmax:   58.90 cm/s LVOT Vmean:  43.600 cm/s LVOT VTI:    0.093 m  AORTA Ao Root diam: 2.50 cm  SHUNTS Systemic VTI:  0.09 m Systemic Diam: 2.10 cm Maudine Sos MD Electronically signed by Maudine Sos MD Signature Date/Time: 12/24/2023/5:21:27 PM    Final     Discharge Medications   Allergies as of 12/25/2023       Reactions   Ibuprofen  Anaphylaxis        Medication List     STOP taking these medications    furosemide  40 MG tablet Commonly known as: LASIX        TAKE these medications    Accu-Chek Softclix Lancets lancets Use 3 (three) times daily. Use as directed to check blood sugar.   amLODipine  5 MG tablet Commonly known as: NORVASC  Take 1 tablet (5 mg total) by mouth daily. Start taking on:  Dec 26, 2023   atorvastatin  40 MG tablet Commonly known as: LIPITOR Take 1 tablet (40 mg total) by mouth daily. Start taking on: Dec 26, 2023   Blood Glucose Monitor System w/Device Kit Use 3 (three) times daily.   BLOOD GLUCOSE TEST STRIPS Strp Use 3 (three) times daily. Use as directed to check blood sugar.   Blood Pressure Monitor/Arm Devi Use to check BP daily. Dx. I10   digoxin  0.125 MG tablet Commonly known as: LANOXIN  Take 1 tablet (0.125 mg total) by mouth daily. Start taking on: Dec 26, 2023   insulin  glargine 100 UNIT/ML Solostar Pen Commonly known as: LANTUS  Inject 35 Units into the skin daily. What changed: how much to take   insulin  lispro 100 UNIT/ML KwikPen Commonly known as: HumaLOG  KwikPen Inject 2-15 Units into the skin 3 (three) times daily after meals. CBG 121 - 150: 2 units, 151 -  200: 3 units, 201 - 250: 5 units, 251 - 300: 8 units, 301 - 350: 11 units, 351 - 400: 15u   Insulin  Pen Needle 31G X 8 MM Misc Use to inject insulin  as directed.   Lancet Device Misc Use 3 (three) times daily.   Lancets Misc. Misc 1 each by Does not apply route in the morning, at noon, and at bedtime. May substitute to any manufacturer covered by patient's insurance.   sacubitril -valsartan  97-103 MG Commonly known as: ENTRESTO  Take 1 tablet by mouth 2 (two) times daily.   spironolactone  25 MG tablet Commonly known as: ALDACTONE  Take 1 tablet (25 mg total) by mouth daily. Start taking on: Dec 26, 2023   Torsemide  40 MG Tabs Take 40 mg by mouth daily. Start taking on: Dec 26, 2023        Disposition   The patient will be discharged in stable condition to home. Discharge Instructions     (HEART FAILURE PATIENTS) Call MD:  Anytime you have any of the following symptoms: 1) 3 pound weight gain in 24 hours or 5 pounds in 1 week 2) shortness of breath, with or without a dry hacking cough 3) swelling in the hands, feet or stomach 4) if you have to sleep on extra  pillows at night in order to breathe.   Complete by: As directed    Diet - low sodium heart healthy   Complete by: As directed    Heart Failure patients record your daily weight using the same scale at the same time of day   Complete by: As directed    Increase activity slowly   Complete by: As directed    PICC line removal   Complete by: As directed        Follow-up Information     [facility] Follow up on 01/13/2024.   Specialty: Cardiology Why: at 9:30 am  [facility] Contact information: [address] [place]   [address] [phone]                  Duration of Discharge Encounter: 18 Time   [place] Lee, NP 12/25/2023, 2:48 PM  Patient seen and examined with the above-signed Advanced Practice Provider and/or Housestaff. I personally reviewed laboratory data, imaging studies and relevant notes. I independently examined the patient and formulated the important aspects of the plan. I have edited the note to reflect any of my changes or salient points. I have personally discussed the plan with the patient and/or family.  See rounding note from earlier today.  Volume status and HTN much improved. Denies CO por SOB. Ok for d/c home today on above meds.   F/u in HF Clinic.   Total encounter/discharge MD time. 40 mins   Jules Oar, MD  6:51 PM

## 2023-12-25 NOTE — Progress Notes (Signed)
 Complex Care Management Note Care Guide Note  12/25/2023 Name: JEREMIE SALTARELLI MRN: [id number] DOB: 1985/09/26   Complex Care Management Outreach Attempts: An unsuccessful telephone outreach was attempted today to offer the patient information about available complex care management services.  Follow Up Plan:  Additional outreach attempts will be made to offer the patient complex care management information and services.   Encounter Outcome:  No Answer  Gust Leghorn  [facility] HealthPopulation Health Care Guide  Direct Dial :[phone] Fax:[phone] Website: [email or web address]

## 2023-12-28 ENCOUNTER — Telehealth: Payer: Self-pay | Admitting: *Deleted

## 2023-12-28 NOTE — Transitions of Care (Post Inpatient/ED Visit) (Signed)
   12/28/2023  Name: Cheryl Mercer MRN: [id number] DOB: 25-Feb-1986  Today's TOC FU Call Status: Today's TOC FU Call Status:: Unsuccessful Call (1st Attempt) Unsuccessful Call (1st Attempt) Date: 12/28/23  Attempted to reach the patient regarding the most recent Inpatient/ED visit.  Follow Up Plan: Additional outreach attempts will be made to reach the patient to complete the Transitions of Care (Post Inpatient/ED visit) call.   Una Ganser BSN RN [facility] Health Care Management Coordinator [email or web address] Direct Dial : [phone]  Fax: [phone] Website: [email or web address]

## 2023-12-28 NOTE — Transitions of Care (Post Inpatient/ED Visit) (Signed)
 12/28/2023  Name: Cheryl Mercer MRN: [id number] DOB: [birth year]  Today's TOC FU Call Status: Today's TOC FU Call Status:: Successful TOC FU Call Completed TOC FU Call Complete Date: 12/28/23 Patient's Name and Date of Birth confirmed.  Transition Care Management Follow-up Telephone Call Date of Discharge: 12/25/23 Discharge Facility: [facility]) Type of Discharge: Inpatient Admission Primary Inpatient Discharge Diagnosis:: Acute on chronic combined systolic and diastolic CHF Any questions or concerns?: Yes Patient Questions/Concerns:: I did not see the gabepentin on the lis of medications Patient Questions/Concerns Addressed: Other: (Patient will follow up with PCP)  Items Reviewed: Did you receive and understand the discharge instructions provided?: Yes Medications obtained,verified, and reconciled?: Yes (Medications Reviewed) Any new allergies since your discharge?: No Dietary orders reviewed?: Yes Type of Diet Ordered:: low sodium heartt healthy carb modified Do you have support at home?: Yes People in Home [RPT]: child(ren), dependent, significant other Name of Support/Comfort Primary Source: Rol[place]  Medications Reviewed Today: Medications Reviewed Today     Reviewed by Eilene Grater, RN (Case Manager) on 12/28/23 at 1229  Med List Status: <None>   Medication Order Taking? Sig Documenting Provider Last Dose Status Informant  amLODipine  (NORVASC ) 5 MG tablet [id number] Yes Take 1 tablet (5 mg total) by mouth daily. Lee, [place], NP Taking Active   atorvastatin  (LIPITOR) 40 MG tablet [id number] Yes Take 1 tablet (40 mg total) by mouth daily. Lee, [place], NP Taking Active   Blood Glucose Monitoring Suppl (BLOOD GLUCOSE MONITOR SYSTEM) w/Device KIT [id number] Yes Use 3 (three) times daily. [name], NP Taking Active Self, Pharmacy Records  Blood Pressure Monitoring (BLOOD PRESSURE MONITOR/ARM) DEVI [id number] No Use to check BP daily. Dx. I10  Patient not  taking: Reported on 12/21/2023   [name], NP Not Taking Active Self, Pharmacy Records  digoxin  (LANOXIN ) 0.125 MG tablet [id number] Yes Take 1 tablet (0.125 mg total) by mouth daily. Lee, [place], NP Taking Active   Glucose Blood (BLOOD GLUCOSE TEST STRIPS) STRP [id number] Yes Use 3 (three) times daily. Use as directed to check blood sugar. [name], NP Taking Active Self, Pharmacy Records  insulin  glargine (LANTUS ) 100 UNIT/ML Solostar Pen [id number] Yes Inject 35 Units into the skin daily. Lee, [place], NP Taking Active   insulin  lispro (HUMALOG  KWIKPEN) 100 UNIT/ML KwikPen [id number] Yes Inject 2-15 Units into the skin 3 (three) times daily after meals. CBG 121 - 150: 2 units, 151 - 200: 3 units, 201 - 250: 5 units, 251 - 300: 8 units, 301 - 350: 11 units, 351 - 400: 15u [name], NP Taking Active Self, Pharmacy Records  Insulin  Pen Needle 31G X 8 MM MISC [id number] Yes Use to inject insulin  as directed. [name], NP Taking Active Self, Pharmacy Records  Lancet Device MISC [id number] Yes Use 3 (three) times daily. [name], NP Taking Active Self, Pharmacy Records  Lancets MISC [id number] Yes Use 3 (three) times daily. Use as directed to check blood sugar. Daren Eck, DO Taking Active Self, Pharmacy Records  Lancets Misc. MISC [id number] Yes 1 each by Does not apply route in the morning, at noon, and at bedtime. May substitute to any manufacturer covered by patient's insurance. [name], NP Taking Active Self, Pharmacy Records  sacubitril -valsartan  (ENTRESTO ) 97-103 MG [id number] Yes Take 1 tablet by mouth 2 (two) times daily. Lee, [place], NP Taking Active   spironolactone  (ALDACTONE ) 25 MG tablet [id number] Yes Take 1 tablet (25 mg total) by mouth daily. Lee, [place], NP Taking Active  torsemide  (DEMADEX ) 20 MG tablet [id number] Yes Take 2 tablets (40 mg total) by mouth daily. Lee, [place], NP Taking Active             Home Care and Equipment/Supplies: Were Home Health  Services Ordered?: NA Any new equipment or medical supplies ordered?: NA  Functional Questionnaire: Do you need assistance with bathing/showering or dressing?: No Do you need assistance with meal preparation?: No Do you need assistance with eating?: No Do you have difficulty maintaining continence: No Do you need assistance with getting out of bed/getting out of a chair/moving?: No Do you have difficulty managing or taking your medications?: No  Follow up appointments reviewed: PCP Follow-up appointment confirmed?: Yes Date of PCP follow-up appointment?: 01/05/24 Follow-up Provider: [name] Specialist [facility] Follow-up appointment confirmed?: Yes Date of Specialist follow-up appointment?: 01/13/24 Follow-Up Specialty Provider:: Heart and Vascular Do you need transportation to your follow-up appointment?: No Do you understand care options if your condition(s) worsen?: Yes-patient verbalized understanding  SDOH Interventions Today    Flowsheet Row Most Recent Value  SDOH Interventions   Food Insecurity Interventions Intervention Not Indicated  [Patient was given resorce information on [id number]]  Housing Interventions Intervention Not Indicated  Transportation Interventions Intervention Not Indicated  Utilities Interventions Intervention Not Indicated  [Patient is receiving the additional money from [facility] to help utility bills]       Goals       Patient Stated (pt-stated)      Getting back on track      VBCI Transitions of Care (TOC) Care Plan      Problems:  Recent Hospitalization for treatment of CHF and DMII Diet/Nutrition/Food Resources Patient was given a food resource list on [id number]  Goal:  Over the next 30 days, the patient will not experience hospital readmission Patient A1C will decrease <12.9 Patient will purchase a scale to monitor weight  Interventions:   Heart Failure Interventions: Basic overview and discussion of pathophysiology of Heart Failure  reviewed Assessed need for readable accurate scales in home Discussed importance of daily weight and advised patient to weigh and record daily Reviewed role of diuretics in prevention of fluid overload and management of heart failure; Discussed the importance of keeping all appointments with provider  Diabetes Interventions: Assessed patient's understanding of A1c goal: <8% Reviewed medications with patient and discussed importance of medication adherence Reviewed scheduled/upcoming provider appointments including: Careguide scheduled patient a follow up appoint Referral made to community resources care guide team for assistance with food resources Lab Results  Component Value Date   HGBA1C 12.9 (H) 12/03/2023    Patient Self Care Activities:  Attend all scheduled provider appointments Call pharmacy for medication refills 3-7 days in advance of running out of medications Call provider office for new concerns or questions  Notify RN Care Manager of TOC call rescheduling needs Participate in Transition of Care Program/Attend TOC scheduled calls Perform all self care activities independently  Perform IADL's (shopping, preparing meals, housekeeping, managing finances) independently Take medications as prescribed   Purchase a scale  Plan:  An initial telephone outreach has been scheduled for: [id number] Next PCP appointment scheduled for: [id number]  Telephone follow up appointment with care management team member scheduled for:  01/07/2024 1:15 Laine Tousey      Patient agreed to follow up Transition of Care Program. Care Guide scheduled PCP appointment  Una Ganser BSN RN [facility] Health Care Management Coordinator [email or web address] Direct Dial : [phone]  Fax: [phone] Website: [email or web address]

## 2023-12-29 ENCOUNTER — Telehealth: Payer: Self-pay | Admitting: *Deleted

## 2023-12-29 NOTE — Progress Notes (Signed)
 Complex Care Management Note Care Guide Note  12/29/2023 Name: Cheryl Mercer MRN: [id number] DOB: 08/20/1985   Complex Care Management Outreach Attempts: An unsuccessful telephone outreach was attempted today to offer the patient information about available complex care management services.  Follow Up Plan:  Additional outreach attempts will be made to offer the patient complex care management information and services.   Encounter Outcome:  No Answer  Gust Leghorn  [facility] HealthPopulation Health Care Guide  Direct Dial [phone] Fax:[phone] Website: [email or web address]

## 2023-12-30 ENCOUNTER — Telehealth: Payer: Self-pay | Admitting: *Deleted

## 2023-12-30 NOTE — Progress Notes (Signed)
 Complex Care Management Note Care Guide Note  12/30/2023 Name: Cheryl Mercer MRN: [id number] DOB: 1985/12/29  [name] is a 38 y.o. year old female who is a primary care patient of [name], NP . The community resource team was consulted for assistance with Food Insecurity and utilities  SDOH screenings and interventions completed:  Yes     SDOH Interventions Today    Flowsheet Row Most Recent Value  SDOH Interventions   Food Insecurity Interventions AMB Referral, Community Resources Provided  [Provided food resources for patient and family]  Utilities Interventions [employer] Provided  [Provided what resources available to patient]        Care guide performed the following interventions: Patient provided with information about care guide support team and interviewed to confirm resource needs.  Follow Up Plan:  No further follow up planned at this time. The patient has been provided with needed resources.  Encounter Outcome:  Patient Visit Completed  Jasmon Mattice Greenauer-Moran  [facility] HealthPopulation Health Care Guide  Direct Dial :[phone] Fax:[phone] Website: [email or web address]

## 2024-01-05 ENCOUNTER — Encounter: Payer: Self-pay | Admitting: General Practice

## 2024-01-05 ENCOUNTER — Ambulatory Visit (INDEPENDENT_AMBULATORY_CARE_PROVIDER_SITE_OTHER): Admitting: General Practice

## 2024-01-05 VITALS — BP 124/82 | HR 102 | Temp 97.9°F | Ht 59.0 in | Wt 137.0 lb

## 2024-01-05 DIAGNOSIS — Z09 Encounter for follow-up examination after completed treatment for conditions other than malignant neoplasm: Secondary | ICD-10-CM | POA: Diagnosis not present

## 2024-01-05 DIAGNOSIS — I5043 Acute on chronic combined systolic (congestive) and diastolic (congestive) heart failure: Secondary | ICD-10-CM

## 2024-01-05 DIAGNOSIS — E119 Type 2 diabetes mellitus without complications: Secondary | ICD-10-CM

## 2024-01-05 DIAGNOSIS — F419 Anxiety disorder, unspecified: Secondary | ICD-10-CM | POA: Diagnosis not present

## 2024-01-05 DIAGNOSIS — Z794 Long term (current) use of insulin: Secondary | ICD-10-CM

## 2024-01-05 DIAGNOSIS — F32A Depression, unspecified: Secondary | ICD-10-CM | POA: Insufficient documentation

## 2024-01-05 DIAGNOSIS — I10 Essential (primary) hypertension: Secondary | ICD-10-CM | POA: Diagnosis not present

## 2024-01-05 MED ORDER — SERTRALINE HCL 50 MG PO TABS
50.0000 mg | ORAL_TABLET | Freq: Every day | ORAL | 0 refills | Status: DC
Start: 2024-01-05 — End: 2024-01-27

## 2024-01-05 NOTE — Assessment & Plan Note (Signed)
 Reviewed hospital notes, imaging, labs.   Doing better overall.   Reports taking all of her medication as prescribed.

## 2024-01-05 NOTE — Assessment & Plan Note (Signed)
 Controlled.   Reviewed hospital notes, labs, imaging, echocardiogram results.   Continue Torsemide  40 mg, Entresto  97/103 mg BID, spironolactone  25 mg, Amlodipine  5 mg, digoxin  0.125 mg.   Follow up with cardiology on 01/13/24.

## 2024-01-05 NOTE — Assessment & Plan Note (Signed)
 Uncontrolled.  Discussed treatment options at length.  Declines therapy at this time.   Start sertraline 25 mg once daily for seven days and then increase to 50 mg thereafter.  Handout provided.   Follow up in 4 weeks.

## 2024-01-05 NOTE — Assessment & Plan Note (Signed)
 Uncontrolled.   Phone number provided to call and schedule endocrinology appointment.  Currently has been using free style libre.  Has been monitoring diet.   Continue Lantus  35 units daily and SSI for meal coverage.  Follow up in 4 weeks.

## 2024-01-05 NOTE — Progress Notes (Signed)
 Established Patient Office Visit  Subjective   Patient ID: Cheryl Mercer, female    DOB: 05/25/1986  Age: 38 y.o. MRN: [id number]  Chief Complaint  Patient presents with   Hospitalization Follow-up   Anxiety    Patient states she is having a hard time relaxing    Anxiety Symptoms include nervous/anxious behavior. Patient reports no chest pain, dizziness, nausea, shortness of breath or suicidal ideas.    Cheryl Mercer is a 38 year old female with past medical history of CHF, dilated cardiomyopathy, hypertension, pulmonary infarct, type II diabetic insulin -dependent, hyperlipidemia, PE presents today for hospital follow-up.  Patient was evaluated on 12/21/2023 by Dr. Bensimhon for acute on chronic systolic CHF when she was advised to go to the emergency room.  She was admitted on 12/22/2023 to 12/25/2023 at [facility].  Given her significant history patient was noted to be only on Entresto  which she was not taking regularly.  She was started on Lasix  on 4/17 after her visit with me.  Given her lower extremity edema and occasional orthostasis she was directly admitted for heart failure optimization with IV diuretics, resumption of oral GDMT regimen and control of blood glucose levels.  She had echo cardiogram on 12/25/2023 which showed EF less than 20%, GIIIDD likely from hypertension, RV normal IVC small, assumed RAP 3.  She was discharged with Torsemide  40 mg, Entresto  97/103 mg twice daily, spironolactone  25 mg daily, amlodipine  5 mg daily, digoxin  0.125 mg daily.  She is not a candidate for ICD or advanced therapies with noncompliance.  Poorly controlled type II diabetic,  she did meet with diabetic coronary while hospitalized.  She was advised to stop drinking alcohol.  Her last lipid panel on 425 showed LDL of 112 in the setting goal of less than 70.  She was advised to continue atorvastatin  40 mg once daily.  Repeat her LFTs and lipids in 6 to 8 weeks.  Blood pressure has  been controlled with amlodipine .  Today she reports that she has been taking all of her medications as prescribed. She has been doing really well. Denies any swelling, shortness of breath, chest pain. She feels so much better. She did meet with the diabetic coordinator did get her started with free style libre and she has been able to monitor her sugars more closely. She has been taking her 35 units of Lantus  daily and then sliding scale for meal coverage. On average she is taking 5-8 units after meals. She has seen an improvement with polyuria. She denies any numbness or tingling. She still doesn't have an appointment with endocrinologist. She has been referred but hasn't heard anything. She has her hospital follow up with cardiology on 01/13/24.   Anxiety and depression: she has trouble sleeping and staying asleep once she is sleeping. She has a lot mind racing thoughts. Given her health, she has not been able to sleep and feels anxious all the time. She denies SI/HI. She has tried Melatonin gummies. She has also been doing "weed" gummies but has been trying to stay away from them now. She has also stopped drinking alcohol. Her last drink was before going to the hospital.   Patient Active Problem List   Diagnosis Date Noted   Anxiety and depression 01/05/2024   Hospital discharge follow-up 01/05/2024   Acute on chronic combined systolic and diastolic CHF, NYHA class 3 (HCC) 12/22/2023   Fainting spell    Demand ischemia (HCC) 02/26/2023   HLD (hyperlipidemia) 02/26/2023  History of chronic CHF EF less than 20% 02/23/2023   CHF exacerbation (HCC) 02/23/2023   AKI (acute kidney injury) (HCC) 02/23/2023   Essential hypertension 02/23/2023   Diabetic neuropathy (HCC) 06/15/2022   Hypomagnesemia 06/14/2022   Obesity (BMI 30-39.9) 06/13/2022   Hypokalemia 06/12/2022   No pertinent past medical history 12/31/2021   Hypotension 12/31/2021   CHF (congestive heart failure) (HCC) 06/14/2021   Non  compliance w medication regimen 06/14/2021   Hyponatremia 06/14/2021   Acute exacerbation of CHF (congestive heart failure) (HCC) 07/10/2020   Apnea    Pulmonary infarct (HCC) 12/17/2019   History of pulmonary embolus (PE) 12/16/2019   CAP (community acquired pneumonia) 12/16/2019   Dilated cardiomyopathy (HCC) 11/10/2019   Elevated troponin 11/07/2019   Multifocal pneumonia 09/07/2019   Acute on chronic systolic CHF (congestive heart failure) (HCC) 09/07/2019   Dyslipidemia 09/07/2019   Back pain 09/07/2019   Atypical pneumonia 09/07/2019   Diabetes mellitus type 2, insulin  dependent (HCC) 03/23/2019   Peripheral edema 03/23/2019   Cardiomegaly 03/23/2019   Proteinuria 03/23/2019   Acute bacterial tonsillitis 05/18/2013    Class: Acute   Lower urinary tract infectious disease 07/30/2012   Past Medical History:  Diagnosis Date   Arthritis    Chronic back pain    Chronic combined systolic and diastolic congestive heart failure (HCC) 09/07/2019   Depression    Diabetes mellitus without complication (HCC)    Fainting spell    History of COVID-19 11/07/2019   Migraines    Peripheral neuropathy    Pregnancy induced hypertension    Pulmonary embolus (HCC) 12/16/2019   Pulmonary infarct (HCC) 12/17/2019   Rhinovirus    Sepsis (HCC) 12/31/2021   Tachycardia    Viral pneumonia    Past Surgical History:  Procedure Laterality Date   CERVICAL BIOPSY  W/ LOOP ELECTRODE EXCISION  2009   Laparoscopic tubal sterilization with Falope rings.  2009   RIGHT/LEFT HEART CATH AND CORONARY ANGIOGRAPHY N/A 03/25/2019   Procedure: RIGHT/LEFT HEART CATH AND CORONARY ANGIOGRAPHY;  Surgeon: Mardell Shade, MD;  Location: [facility] INVASIVE CV LAB;  Service: Cardiovascular;  Laterality: N/A;   TUBAL LIGATION     Allergies  Allergen Reactions   Ibuprofen  Anaphylaxis         01/05/2024    1:57 PM 12/21/2023    3:20 PM 12/03/2023   12:10 PM  Depression screen PHQ 2/9  Decreased Interest 3 2 3    Down, Depressed, Hopeless 3 2 3   PHQ - 2 Score 6 4 6   Altered sleeping 3 3 3   Tired, decreased energy 3 1 3   Change in appetite 1 1 1   Feeling bad or failure about yourself  2 2 3   Trouble concentrating 2 1 1   Moving slowly or fidgety/restless 1 0 3  Suicidal thoughts 2 0 1  PHQ-9 Score 20 12 21   Difficult doing work/chores Very difficult Very difficult Somewhat difficult       01/05/2024    1:57 PM 12/03/2023   12:11 PM 01/14/2022    9:14 AM 09/13/2021    8:23 AM  GAD 7 : Generalized Anxiety Score  Nervous, Anxious, on Edge 3 3 0 0  Control/stop worrying 3 3 0 0  Worry too much - different things 3 3 0 0  Trouble relaxing 3 3 0 0  Restless 2 2 0 0  Easily annoyed or irritable 3 3 0 0  Afraid - awful might happen 3 1 0 0  Total GAD 7  Score 20 18 0 0  Anxiety Difficulty Very difficult Somewhat difficult Not difficult at all Not difficult at all      Review of Systems  Constitutional:  Negative for chills and fever.  Respiratory:  Negative for shortness of breath.   Cardiovascular:  Negative for chest pain.  Gastrointestinal:  Negative for abdominal pain, constipation, diarrhea, heartburn, nausea and vomiting.  Genitourinary:  Negative for dysuria, frequency and urgency.  Neurological:  Negative for dizziness and headaches.  Endo/Heme/Allergies:  Negative for polydipsia.  Psychiatric/Behavioral:  Negative for depression and suicidal ideas. The patient is nervous/anxious.       Objective:     BP 124/82 (BP Location: Left Arm, Patient Position: Sitting, Cuff Size: Normal)   Pulse (!) 102   Temp 97.9 F (36.6 C) (Oral)   Ht 4\' 11"  (1.499 m)   Wt 137 lb (62.1 kg)   SpO2 98%   BMI 27.67 kg/m  BP Readings from Last 3 Encounters:  01/05/24 124/82  12/25/23 97/70  12/21/23 (!) 160/100   Wt Readings from Last 3 Encounters:  01/05/24 137 lb (62.1 kg)  12/25/23 132 lb 15 oz (60.3 kg)  12/21/23 144 lb 9.6 oz (65.6 kg)      Physical Exam Vitals and nursing note  reviewed.  Constitutional:      Appearance: Normal appearance.  Cardiovascular:     Rate and Rhythm: Normal rate and regular rhythm.     Pulses: Normal pulses.     Heart sounds: Normal heart sounds.  Pulmonary:     Effort: Pulmonary effort is normal.     Breath sounds: Normal breath sounds.  Musculoskeletal:     Right lower leg: No edema.     Left lower leg: No edema.  Neurological:     Mental Status: She is alert and oriented to person, place, and time.  Psychiatric:        Mood and Affect: Mood normal.        Behavior: Behavior normal.        Thought Content: Thought content normal.        Judgment: Judgment normal.      No results found for any visits on 01/05/24.     The ASCVD Risk score (Arnett DK, et al., 2019) failed to calculate for the following reasons:   The 2019 ASCVD risk score is only valid for ages 34 to 76    Assessment & Plan:  Hospital discharge follow-up Assessment & Plan: Reviewed hospital notes, imaging, labs.   Doing better overall.   Reports taking all of her medication as prescribed.   Essential hypertension Assessment & Plan: Controlled.   BP at goal.  Continue Amlodipine  5 mg.    Anxiety and depression Assessment & Plan: Uncontrolled.  Discussed treatment options at length.  Declines therapy at this time.   Start sertraline 25 mg once daily for seven days and then increase to 50 mg thereafter.  Handout provided.   Follow up in 4 weeks.  Orders: -     Sertraline HCl; Take 1 tablet (50 mg total) by mouth daily.  Dispense: 30 tablet; Refill: 0  Acute on chronic combined systolic and diastolic CHF, NYHA class 3 (HCC) Assessment & Plan: Controlled.   Reviewed hospital notes, labs, imaging, echocardiogram results.   Continue Torsemide  40 mg, Entresto  97/103 mg BID, spironolactone  25 mg, Amlodipine  5 mg, digoxin  0.125 mg.   Follow up with cardiology on 01/13/24.   Diabetes mellitus type 2, insulin  dependent (HCC) Assessment  &  Plan: Uncontrolled.   Phone number provided to call and schedule endocrinology appointment.  Currently has been using free style libre.  Has been monitoring diet.   Continue Lantus  35 units daily and SSI for meal coverage.  Follow up in 4 weeks.    Return in about 4 weeks (around 02/02/2024) for anxiety.    [name], NP

## 2024-01-05 NOTE — Assessment & Plan Note (Signed)
 Controlled.   BP at goal.  Continue Amlodipine  5 mg.

## 2024-01-05 NOTE — Patient Instructions (Addendum)
 Start Sertraline 25 mg (0.5 tablet) for one week and then increase to 50 mg (1 full tablet) thereafter.  This will make your anxiety worse in the beginning.   No changes to other medications.   Keep your appointment for cardiology.  Call and schedule appointment with endocrinology - [phone]. Referral is in since 12/03/23. It was urgent. Hemoglobin A1c 12.7.  Follow up with me 4 weeks.   It was a pleasure to see you today!

## 2024-01-07 ENCOUNTER — Telehealth: Payer: Self-pay

## 2024-01-07 NOTE — Transitions of Care (Post Inpatient/ED Visit) (Signed)
 Transition of Care week 2  Visit Note  01/07/2024  Name: Cheryl Mercer MRN: [id number]          DOB: 1986-01-27  Situation: Patient enrolled in [facility] 30-day program. Visit completed with patient by telephone. Patient was "about and about but states she could talk a little."  Background:   Initial Transition Care Management Follow-up Telephone Call Date of Discharge: 12/25/23  Past Medical History:  Diagnosis Date   Arthritis    Chronic back pain    Chronic combined systolic and diastolic congestive heart failure (HCC) 09/07/2019   Depression    Diabetes mellitus without complication (HCC)    Fainting spell    History of COVID-19 11/07/2019   Migraines    Peripheral neuropathy    Pregnancy induced hypertension    Pulmonary embolus (HCC) 12/16/2019   Pulmonary infarct (HCC) 12/17/2019   Rhinovirus    Sepsis (HCC) 12/31/2021   Tachycardia    Viral pneumonia     Assessment: Patient Reported Symptoms: Cognitive Cognitive Status: Alert and oriented to person, place, and time      Neurological Neurological Review of Symptoms: No symptoms reported    HEENT HEENT Symptoms Reported: No symptoms reported      Cardiovascular Cardiovascular Symptoms Reported: Fatigue Does patient have uncontrolled Hypertension?: Yes Is patient checking Blood Pressure at home?: No Patient's Recent BP reading at home: Going need for BP cuff Cardiovascular Conditions: Heart failure, Hypertension, High blood cholesterol  Respiratory Respiratory Symptoms Reported: Shortness of breath Other Respiratory Symptoms: when walking    Endocrine Patient reports the following symptoms related to hypoglycemia or hyperglycemia : Weakness or fatigue Is patient diabetic?: Yes Is patient checking blood sugars at home?: Yes Endocrine Conditions: Diabetes Endocrine Management Strategies: Medication therapy Endocrine Self-Management Outcome: 3 (uncertain)  Gastrointestinal Gastrointestinal Symptoms  Reported: No symptoms reported      Genitourinary Genitourinary Symptoms Reported: No symptoms reported    Integumentary Integumentary Symptoms Reported: No symptoms reported Skin Self-Management Outcome: 4 (good) Skin Comment: Check feet daily  Musculoskeletal Musculoskelatal Symptoms Reviewed: Weakness        Psychosocial Psychosocial Symptoms Reported: Anxiety - if selected complete GAD, Depression - if selected complete PHQ 2-9 Additional Psychological Details: Patient states "I'm on medication"         Vitals:   01/07/24 1332  BP: 124/82    Medications Reviewed Today     Reviewed by Jamie Mccoy, RN (Registered Nurse) on 01/07/24 at 1622  Med List Status: <None>   Medication Order Taking? Sig Documenting Provider Last Dose Status Informant  amLODipine  (NORVASC ) 5 MG tablet [id number] Yes Take 1 tablet (5 mg total) by mouth daily. Lee, [place], NP Taking Active   atorvastatin  (LIPITOR) 40 MG tablet [id number] Yes Take 1 tablet (40 mg total) by mouth daily. Lee, [place], NP Taking Active   Blood Glucose Monitoring Suppl (BLOOD GLUCOSE MONITOR SYSTEM) w/Device KIT [id number] Yes Use 3 (three) times daily. [name], NP Taking Active Self, Pharmacy Records  Blood Pressure Monitoring (BLOOD PRESSURE MONITOR/ARM) DEVI [id number] No Use to check BP daily. Dx. I10  Patient not taking: Reported on 01/07/2024   [name], NP Not Taking Active Self, Pharmacy Records           Med Note [place], Britta Candy Jan 07, 2024  4:20 PM) Needs to get a BP cuff states was good at 01/05/24 MD appointment  digoxin  (LANOXIN ) 0.125 MG tablet [id number] Yes Take 1 tablet (0.125 mg total) by mouth  daily. Lee, [place], NP Taking Active   Glucose Blood (BLOOD GLUCOSE TEST STRIPS) STRP [id number] Yes Use 3 (three) times daily. Use as directed to check blood sugar. [name], NP Taking Active Self, Pharmacy Records  insulin  glargine (LANTUS ) 100 UNIT/ML Solostar Pen [id number]  Inject 35 Units  into the skin daily. Lee, [place], NP  Active   insulin  lispro (HUMALOG  KWIKPEN) 100 UNIT/ML KwikPen [id number]  Inject 2-15 Units into the skin 3 (three) times daily after meals. CBG 121 - 150: 2 units, 151 - 200: 3 units, 201 - 250: 5 units, 251 - 300: 8 units, 301 - 350: 11 units, 351 - 400: 15u [name], NP  Active Self, Pharmacy Records  Insulin  Pen Needle 31G X 8 MM MISC [id number]  Use to inject insulin  as directed. [name], NP  Active Self, Pharmacy Records  Lancet Device MISC [id number]  Use 3 (three) times daily. [name], NP  Active Self, Pharmacy Records  Lancets MISC [id number]  Use 3 (three) times daily. Use as directed to check blood sugar. Daren Eck, DO  Active Self, Pharmacy Records  sacubitril -valsartan  (ENTRESTO ) 97-103 MG [id number]  Take 1 tablet by mouth 2 (two) times daily. Lee, [place], NP  Active   sertraline (ZOLOFT) 50 MG tablet [id number] Yes Take 1 tablet (50 mg total) by mouth daily. [name], NP Taking Active   spironolactone  (ALDACTONE ) 25 MG tablet [id number] Yes Take 1 tablet (25 mg total) by mouth daily. Lee, [place], NP Taking Active   torsemide  (DEMADEX ) 20 MG tablet [id number] Yes Take 2 tablets (40 mg total) by mouth daily. Lee, [place], NP Taking Active           Patient was currently out and about and partial medications reviewed when taking about insulin  states she reviewed at PCP appointment.   Goals Addressed             This Visit's Progress    [facility] Transitions of Care (TOC) Care Plan   No change    Problems:  Recent Hospitalization for treatment of CHF and DMII Diet/Nutrition/Food Resources Patient was given a food resource list on [id number] has not checked on resources yet but has information  Goal:  Over the next 30 days, the patient will not experience hospital readmission Patient A1C will decrease <12.9 Patient will purchase a scale to monitor weight to follow up with [facility] at visit  Interventions:   Heart Failure  Interventions: Basic overview and discussion of pathophysiology of Heart Failure reviewed Assessed need for readable accurate scales in home Discussed importance of daily weight and advised patient to weigh and record daily Reviewed role of diuretics in prevention of fluid overload and management of heart failure; Discussed the importance of keeping all appointments with provider  Diabetes Interventions: Assessed patient's understanding of A1c goal: <8% Reviewed medications with patient and discussed importance of medication adherence Reviewed scheduled/upcoming provider appointments including: Careguide scheduled patient a follow up appoint Referral made to community resources care guide team for assistance with food resources Lab Results  Component Value Date   HGBA1C 12.9 (H) 12/03/2023    Patient Self Care Activities:  Attend all scheduled provider appointments Call pharmacy for medication refills 3-7 days in advance of running out of medications Call provider office for new concerns or questions  Notify RN Care Manager of TOC call rescheduling needs Participate in Transition of Care Program/Attend TOC scheduled calls Perform all self care activities independently  Perform IADL's (shopping, preparing meals, housekeeping, managing finances) independently  Take medications as prescribed   Purchase a scale - encouraged  Plan:  Follow up with PCP on diabetes management as well Encourage to continue to watch Carbs and to exercise as possible Telephone follow up appointment with care management team member scheduled for:  01/14/24 follow up telephone call        [place], RN, BSN, CCM [place]  [facility], [facility] RN Care Manager Direct Dial : [phone]

## 2024-01-07 NOTE — Patient Instructions (Signed)
 Visit Information  Thank you for taking time to visit with me today. Please don't hesitate to contact me if I can be of assistance to you before our next scheduled telephone appointment.  Our next appointment is by telephone on 01/14/2024 at 2:30 pm  Following is a copy of your care plan:   Goals Addressed             This Visit's Progress    [facility]I Transitions of Care (TOC) Care Plan   No change    Problems:  Recent Hospitalization for treatment of CHF and DMII Diet/Nutrition/Food Resources Patient was given a food resource list on [id number]  Goal:  Over the next 30 days, the patient will not experience hospital readmission Patient A1C will decrease <12.9 Patient will purchase a scale to monitor weight  Interventions:   Heart Failure Interventions: Basic overview and discussion of pathophysiology of Heart Failure reviewed Assessed need for readable accurate scales in home Discussed importance of daily weight and advised patient to weigh and record daily Reviewed role of diuretics in prevention of fluid overload and management of heart failure; Discussed the importance of keeping all appointments with provider  Diabetes Interventions: Assessed patient's understanding of A1c goal: <8% Reviewed medications with patient and discussed importance of medication adherence Reviewed scheduled/upcoming provider appointments including: Careguide scheduled patient a follow up appoint Referral made to community resources care guide team for assistance with food resources Lab Results  Component Value Date   HGBA1C 12.9 (H) 12/03/2023    Patient Self Care Activities:  Attend all scheduled provider appointments Call pharmacy for medication refills 3-7 days in advance of running out of medications Call provider office for new concerns or questions  Notify RN Care Manager of TOC call rescheduling needs Participate in Transition of Care Program/Attend TOC scheduled calls Perform all self  care activities independently  Perform IADL's (shopping, preparing meals, housekeeping, managing finances) independently Take medications as prescribed   Purchase a scale  Plan:  An initial telephone outreach has been scheduled for: [id number] Next PCP appointment scheduled for: [id number]  Telephone follow up appointment with care management team member scheduled for:  01/07/2024 1:15 Laine Tousey        Patient verbalizes understanding of instructions and care plan provided today and agrees to view in MyChart. Active MyChart status and patient understanding of how to access instructions and care plan via MyChart confirmed with patient.     Telephone follow up appointment with care management team member scheduled for: The patient will call [facility] scale at [facility] * as advised to follow up with the Advanced HF team regarding scale, if possible.   Please call the care guide team at [phone] if you need to cancel or reschedule your appointment.   Please call the [place]  National Suicide Prevention Lifeline: 513-201-5204 or TTY: (831)360-3181 TTY [phone]) to talk to a trained counselor if you are experiencing a Mental Health or Behavioral Health Crisis or need someone to talk to.  Brown [place], RN, BSN, CCM [facility], [facility] RN Care Manager Direct Dial : [phone]

## 2024-01-08 NOTE — Progress Notes (Signed)
 ADV[facility]ED [facility] CLINIC NOTE  Primary Care: [name], NP Primary Cardiologist: Dr. Julane Ny Reason for Visit: Heart Failure Follow-up  HPI: Cheryl Mercer is a 38 y.o.  hispanic female with chronic systolic heart failure, h/o PE, migraines, hypertension, and poorly controlled T2DM. She has previously no showed to multiple AHF clinic visits. Has been admitted several time with CHF exacerbations 2/2 med noncompliance.    Admitted 7/24 with a/c HF. Echo showed EF < 20%. RV severely reduced. Mild concentric LVH, LV with GHK, smoke seen in LA, RA severely dilated, mild MR. Felt to be 2/2 to ETOH, HTN and medication noncompliance. GDMT titrated and she was discharged home.   Directly admitted from [facility] Clinic for hyperglycemia and CHF exacerbation d/t medication noncompliance. Resumed on GDMT.  She returns today for heart failure follow up with son. Overall feeling better since last admission. NYHA III. Reports dizziness, dyspnea and fatigue. Denies chest pain, lower extremity edema, near-syncope, orthopnea, palpitations, and abnormal bleeding. Able to perform ADLs. Appetite okay, has been eating low sugar, low sodium diet. Has stopped drinking ETOH. Reports the blood sugars have been better controlled. Will start checking her BP and weight at home. Compliant with all medications.   Cardiac studies - Echo (7/24): EF < 20% - Echo 10/23 EF <20%, RV severely reduced. Mild concentric LVH, LV with GHK, smoke seen in LA, RA severely dilated, mild MR - Echo 5/23 EF 25-30%, GIIIDD, RV mod reduced, LA/RA mildly dilated - Echo 10/22 EF 30-35%, normal RV - Echo (2021): EF at 10-15% and moderately reduced RV - RHC/LHC 03/25/19 showed no CAD. Well compensated hemodynamics with low filling pressures.  - cMRI (8/20): w/ EF at 15% and no significant LGE   Past Medical History:  Diagnosis Date   Arthritis    Chronic back pain    Chronic combined systolic and diastolic congestive heart failure (HCC) 09/07/2019    Depression    Diabetes mellitus without complication (HCC)    Fainting spell    History of COVID-19 11/07/2019   Migraines    Peripheral neuropathy    Pregnancy induced hypertension    Pulmonary embolus (HCC) 12/16/2019   Pulmonary infarct (HCC) 12/17/2019   Rhinovirus    Sepsis (HCC) 12/31/2021   Tachycardia    Viral pneumonia    Current Outpatient Medications  Medication Sig Dispense Refill   amLODipine  (NORVASC ) 5 MG tablet Take 1 tablet (5 mg total) by mouth daily. 90 tablet 4   atorvastatin  (LIPITOR) 40 MG tablet Take 1 tablet (40 mg total) by mouth daily. 90 tablet 4   Blood Glucose Monitoring Suppl (BLOOD GLUCOSE MONITOR SYSTEM) w/Device KIT Use 3 (three) times daily. 1 kit 0   Blood Pressure Monitoring (BLOOD PRESSURE MONITOR/ARM) DEVI Use to check BP daily. Dx. [email or web address] (Patient not taking: Reported on 01/07/2024) 1 each 0   digoxin  (LANOXIN ) 0.125 MG tablet Take 1 tablet (0.125 mg total) by mouth daily. 90 tablet 4   Glucose Blood (BLOOD GLUCOSE TEST STRIPS) STRP Use 3 (three) times daily. Use as directed to check blood sugar. 100 strip 0   insulin  glargine (LANTUS ) 100 UNIT/ML Solostar Pen Inject 35 Units into the skin daily. 15 mL 0   insulin  lispro (HUMALOG  KWIKPEN) 100 UNIT/ML KwikPen Inject 2-15 Units into the skin 3 (three) times daily after meals. CBG 121 - 150: 2 units, 151 - 200: 3 units, 201 - 250: 5 units, 251 - 300: 8 units, 301 - 350: 11 units, 351 - 400: 15u 15  mL 0   Insulin  Pen Needle 31G X 8 MM MISC Use to inject insulin  as directed. 100 each 0   Lancet Device MISC Use 3 (three) times daily. 1 each 0   Lancets MISC Use 3 (three) times daily. Use as directed to check blood sugar. 100 each 0   sacubitril -valsartan  (ENTRESTO ) 97-103 MG Take 1 tablet by mouth 2 (two) times daily. 180 tablet 4   sertraline (ZOLOFT) 50 MG tablet Take 1 tablet (50 mg total) by mouth daily. 30 tablet 0   spironolactone  (ALDACTONE ) 25 MG tablet Take 1 tablet (25 mg total) by mouth daily.  90 tablet 4   torsemide  (DEMADEX ) 20 MG tablet Take 2 tablets (40 mg total) by mouth daily. 180 tablet 4   No current facility-administered medications for this visit.   Allergies  Allergen Reactions   Ibuprofen  Anaphylaxis   Social History   Socioeconomic History   Marital status: Married    Spouse name: Not on file   Number of children: 4   Years of education: Not on file   Highest education level: High school graduate  Occupational History   Not on file  Tobacco Use   Smoking status: Former    Types: Cigarettes   Smokeless tobacco: Never  Vaping Use   Vaping status: Some Days  Substance and Sexual Activity   Alcohol use: Yes    Alcohol/week: 6.0 standard drinks of alcohol    Types: 6 Shots of liquor per week   Drug use: Not Currently    Types: Marijuana   Sexual activity: Yes    Birth control/protection: Surgical    Comment: band placed around tubes in 2009 surgically by Dr. Monty App  Other Topics Concern   Not on file  Social History Narrative   Not on file   Social Drivers of Health   Financial Resource Strain: High Risk (12/21/2023)   Overall Financial Resource Strain (CARDIA)    Difficulty of Paying Living Expenses: Hard  Food Insecurity: Food Insecurity Present (12/28/2023)   Hunger Vital Sign    Worried About Running Out of Food in the Last Year: Sometimes true    Ran Out of Food in the Last Year: Sometimes true  Transportation Needs: No Transportation Needs (12/28/2023)   PRAPARE - Administrator, Civil Service (Medical): No    Lack of Transportation (Non-Medical): No  Physical Activity: Insufficiently Active (12/21/2023)   Exercise Vital Sign    Days of Exercise per Week: 7 days    Minutes of Exercise per Session: 20 min  Stress: No Stress Concern Present (12/21/2023)   [employer] of Occupational Health - Occupational Stress Questionnaire    Feeling of Stress : Not at all  Social Connections: Unknown (12/21/2023)   Social Connection and  Isolation Panel [NHANES]    Frequency of Communication with Friends and Family: More than three times a week    Frequency of Social Gatherings with Friends and Family: More than three times a week    Attends Religious Services: More than 4 times per year    Active Member of [employer] or Organizations: Yes    Attends Banker Meetings: More than 4 times per year    Marital Status: Patient declined  Intimate Partner Violence: Not At Risk (12/28/2023)   Humiliation, Afraid, Rape, and Kick questionnaire    Fear of Current or Ex-Partner: No    Emotionally Abused: No    Physically Abused: No    Sexually Abused: No  Family History  Problem Relation Age of Onset   Stroke Mother    Miscarriages / [place] Mother    Depression Mother    Cancer Mother    Arthritis Mother    Hypertension Mother    Heart attack Mother    Mental illness Mother    Hypertension Father    Hyperlipidemia Father    Heart disease Father    Early death Father    Drug abuse Father    Depression Father    Alcohol abuse Father    Asthma Brother    Hypertension Maternal Grandmother    Heart attack Maternal Grandmother    Early death Maternal Grandmother    Diabetes Maternal Grandmother    COPD Maternal Grandmother    Cancer Maternal Grandmother    Arthritis Maternal Grandmother    Stroke Maternal Grandmother    Hypertension Maternal Grandfather    Hyperlipidemia Maternal Grandfather    Heart disease Maternal Grandfather    Heart attack Maternal Grandfather    Early death Maternal Grandfather    Diabetes Maternal Grandfather    COPD Maternal Grandfather    Arthritis Maternal Grandfather    Stroke Maternal Grandfather    Hypertension Paternal Grandmother    Hyperlipidemia Paternal Grandmother    Early death Paternal Grandmother    COPD Paternal Grandmother    Miscarriages / Stillbirths Paternal Grandmother    Stroke Paternal Grandmother    Kidney disease Paternal Grandfather    Hypertension  Paternal Grandfather    Hyperlipidemia Paternal Grandfather    Heart attack Paternal Grandfather    Early death Paternal Grandfather    Anesthesia problems Neg Hx    There were no vitals taken for this visit.  Wt Readings from Last 3 Encounters:  01/05/24 62.1 kg (137 lb)  12/25/23 60.3 kg (132 lb 15 oz)  12/21/23 65.6 kg (144 lb 9.6 oz)   PHYSICAL EXAM: General: Ashen appearing. No distress on RA. Walked into clinic Cardiac: JVP ~8cm. S1 and S2 present. No murmurs or rub. Resp: Lung sounds clear and equal B/L Abdomen: Soft, non-tender, non-distended.  Extremities: Warm and dry.  No peripheral edema.  Neuro: Alert and oriented x3. Affect pleasant.   ECG (personally reviewed): NSR 90 bpm  ReDs reading: 38 %, abnormal  ASSESSMENT & PLAN:  1. Acute on Chronic biventricular systolic heart failure - Echo (10/23): EF <20%, RV severely reduced. Mild concentric LVH, LV with GHK, smoke seen in LA, RA severely dilated, mild MR - Echo (7/24): EF < 20%, RV moderately reduced  - Suspect CM 2/2 uncontrolled hypertension, ETOH, medication noncompliance and DM. Remains an issue.  - NYHA III. Volume up by ReDs and exam.  - Continue Torsemide  40 mg daily, has not been making much urine with this recently. Increase to 80 mg Torsemide  x3 days, then resume 40 mg.  - Continue Entresto  97/103 mg bid - Continue spiro 25 mg daily - Continue digoxin  0.125 mg daily - Will hold on adding beta blocker with frequent dizziness and hypervolemia - No SGLT2i, A1c 12.9, restart at next appointment if BS remains controlled - not candidate for ICD or advanced therapies with noncompliance - her and family are currently working hard on compliance   2. HTN - continue norvasc  5 mg daily   3. DM II - Last A1C 12.9 - Has been compliant with insulin , does have issues with BG swings overnight.   4. H/o PE w/ pulmonary infarct - treated with eliquis  x 6 months 4/21  5. Alcohol use - reports abstinence   6.  HLD - continue atorva 40 mg daily. Will check Lipid panel at next visit.   Follow up in 3-4 weeks with APP  [place] Immanuel Fedak, NP  3:19 PM

## 2024-01-12 ENCOUNTER — Telehealth (HOSPITAL_COMMUNITY): Payer: Self-pay

## 2024-01-12 NOTE — Telephone Encounter (Signed)
 Called to confirm/remind patient of their appointment at the [facility] on 01/13/24.   Appointment:   [x] Confirmed  [] Left mess   [] No answer/No voice mail  [] VM Full/unable to leave message  [] Phone not in service  Patient reminded to bring all medications and/or complete list.  Confirmed patient has transportation. Gave directions, instructed to utilize valet parking.

## 2024-01-13 ENCOUNTER — Ambulatory Visit (HOSPITAL_COMMUNITY)
Admission: RE | Admit: 2024-01-13 | Discharge: 2024-01-13 | Disposition: A | Discharge status: Home / Self Care | Source: Ambulatory Visit | Attending: Cardiology | Admitting: Cardiology

## 2024-01-13 ENCOUNTER — Encounter (HOSPITAL_COMMUNITY): Payer: Self-pay

## 2024-01-13 ENCOUNTER — Ambulatory Visit (HOSPITAL_COMMUNITY): Payer: Self-pay | Admitting: Cardiology

## 2024-01-13 VITALS — BP 142/86 | HR 92 | Wt 137.0 lb

## 2024-01-13 DIAGNOSIS — Z87891 Personal history of nicotine dependence: Secondary | ICD-10-CM | POA: Diagnosis not present

## 2024-01-13 DIAGNOSIS — R9431 Abnormal electrocardiogram [ECG] [EKG]: Secondary | ICD-10-CM | POA: Insufficient documentation

## 2024-01-13 DIAGNOSIS — E785 Hyperlipidemia, unspecified: Secondary | ICD-10-CM | POA: Diagnosis not present

## 2024-01-13 DIAGNOSIS — I5022 Chronic systolic (congestive) heart failure: Secondary | ICD-10-CM

## 2024-01-13 DIAGNOSIS — I5082 Biventricular heart failure: Secondary | ICD-10-CM | POA: Insufficient documentation

## 2024-01-13 DIAGNOSIS — I110 Hypertensive heart disease with heart failure: Secondary | ICD-10-CM | POA: Diagnosis not present

## 2024-01-13 DIAGNOSIS — I10 Essential (primary) hypertension: Secondary | ICD-10-CM | POA: Diagnosis not present

## 2024-01-13 DIAGNOSIS — Z86711 Personal history of pulmonary embolism: Secondary | ICD-10-CM | POA: Insufficient documentation

## 2024-01-13 DIAGNOSIS — R5383 Other fatigue: Secondary | ICD-10-CM | POA: Diagnosis not present

## 2024-01-13 DIAGNOSIS — Z5986 Financial insecurity: Secondary | ICD-10-CM | POA: Diagnosis not present

## 2024-01-13 DIAGNOSIS — Z79899 Other long term (current) drug therapy: Secondary | ICD-10-CM | POA: Insufficient documentation

## 2024-01-13 DIAGNOSIS — Z794 Long term (current) use of insulin: Secondary | ICD-10-CM | POA: Diagnosis not present

## 2024-01-13 DIAGNOSIS — E1165 Type 2 diabetes mellitus with hyperglycemia: Secondary | ICD-10-CM | POA: Diagnosis not present

## 2024-01-13 DIAGNOSIS — E119 Type 2 diabetes mellitus without complications: Secondary | ICD-10-CM | POA: Diagnosis not present

## 2024-01-13 DIAGNOSIS — I5023 Acute on chronic systolic (congestive) heart failure: Secondary | ICD-10-CM | POA: Diagnosis not present

## 2024-01-13 DIAGNOSIS — R42 Dizziness and giddiness: Secondary | ICD-10-CM | POA: Diagnosis not present

## 2024-01-13 LAB — BASIC METABOLIC PANEL WITH GFR
Anion gap: 9 (ref 5–15)
BUN: 22 mg/dL — ABNORMAL HIGH (ref 6–20)
CO2: 29 mmol/L (ref 22–32)
Calcium: 8.9 mg/dL (ref 8.9–10.3)
Chloride: 96 mmol/L — ABNORMAL LOW (ref 98–111)
Creatinine, Ser: 1.06 mg/dL — ABNORMAL HIGH (ref 0.44–1.00)
GFR, Estimated: >60 mL/min (ref >60)
Glucose, Bld: 220 mg/dL — ABNORMAL HIGH (ref 70–99)
Potassium: 3.5 mmol/L (ref 3.5–5.1)
Sodium: 134 mmol/L — ABNORMAL LOW (ref 135–145)

## 2024-01-13 LAB — BRAIN NATRIURETIC PEPTIDE: B Natriuretic Peptide: 317.4 pg/mL — ABNORMAL HIGH (ref 0.0–100.0)

## 2024-01-13 NOTE — Patient Instructions (Addendum)
 Thank you for coming in today  If you had labs drawn today, any labs that are abnormal the clinic will call you No news is good news  You have been ordered a sleep study, [facility] will call [facility] to make appointment.   Medications: Increase torsemide  80 mg for 3 days then back to 40 mg daily  Follow up appointments: Your physician recommends that you return for lab work in: 10 days for BMET  Your physician recommends that you schedule a follow-up appointment in:  3-4 weeks in clinic   Do the following things EVERYDAY: Weigh yourself in the morning before breakfast. Write it down and keep it in a log. Take your medicines as prescribed Eat low salt foods--Limit salt (sodium) to 2000 mg per day.  Stay as active as you can everyday Limit all fluids for the day to less than 2 liters   At the [facility], you and your health needs are our priority. As part of our continuing mission to provide you with exceptional heart care, we have created designated Provider Care Teams. These Care Teams include your primary Cardiologist (physician) and Advanced Practice Providers (APPs- Physician Assistants and Nurse Practitioners) who all work together to provide you with the care you need, when you need it.   You may see any of the following providers on your designated Care Team at your next follow up: Dr Jules Oar Dr Peder Bourdon Dr. Mimi Alt, NP Ruddy Corral, [place] [facility] [place], [place] Dennise Fitz, NP Luster Salters, PharmD   Please be sure to bring in all your medications bottles to every appointment.    Thank you for choosing [facility]  If you have any questions or concerns before your next appointment please send us  a message through [place] or call our office at [phone].    TO LEAVE A MESSAGE FOR THE NURSE SELECT OPTION 2, PLEASE LEAVE A MESSAGE INCLUDING: YOUR  NAME DATE OF BIRTH CALL BACK NUMBER REASON FOR CALL**this is important as we prioritize the call backs  YOU WILL RECEIVE A CALL BACK THE SAME DAY AS LONG AS YOU CALL BEFORE 4:00 PM

## 2024-01-13 NOTE — Progress Notes (Signed)
 ReDS Vest / Clip - 01/13/24 1000       ReDS Vest / Clip   Station Marker A    Ruler Value 26.5    ReDS Value Range Moderate volume overload    ReDS Actual Value 38

## 2024-01-14 ENCOUNTER — Telehealth: Payer: Self-pay

## 2024-01-19 ENCOUNTER — Telehealth: Payer: Self-pay

## 2024-01-19 NOTE — Progress Notes (Signed)
 Complex Care Management Note Care Guide Note  01/19/2024 Name: Cheryl Mercer MRN: [id number] DOB: August 16, 1986   Complex Care Management Outreach Attempts: An unsuccessful telephone outreach was attempted today to offer the patient information about available complex care management services.  Follow Up Plan:  Additional outreach attempts will be made to offer the patient complex care management information and services.   Encounter Outcome:  No Answer  Lenton Rail , RMA     [place]  [facility], [facility] Guide  Direct Dial : [phone]  Website: [email or web address]

## 2024-01-20 ENCOUNTER — Encounter: Payer: Self-pay | Admitting: General Practice

## 2024-01-21 ENCOUNTER — Other Ambulatory Visit (HOSPITAL_COMMUNITY)

## 2024-01-22 ENCOUNTER — Other Ambulatory Visit (HOSPITAL_COMMUNITY)

## 2024-01-22 NOTE — Progress Notes (Signed)
 Complex Care Management Note Care Guide Note  01/22/2024 Name: Cheryl Mercer MRN: [id number] DOB: 12/12/1985   Complex Care Management Outreach Attempts: A second unsuccessful outreach was attempted today to offer the patient with information about available complex care management services.  Follow Up Plan:  Additional outreach attempts will be made to offer the patient complex care management information and services.   Encounter Outcome:  No Answer  Lenton Rail , RMA     [place]  [facility], [facility] Guide  Direct Dial : [phone]  Website: [email or web address]

## 2024-01-27 ENCOUNTER — Other Ambulatory Visit: Payer: Self-pay | Admitting: General Practice

## 2024-01-27 DIAGNOSIS — F32A Depression, unspecified: Secondary | ICD-10-CM

## 2024-01-27 NOTE — Telephone Encounter (Signed)
 Called and spoke to pt. Made Virtual visit 01-29-24.

## 2024-01-27 NOTE — Telephone Encounter (Signed)
 Pharmacy is requesting 90 day supply for the sertraline . Also states they need Dx code. Last OV 01-05-24 No Future OV with PCP [facility]

## 2024-01-28 NOTE — Progress Notes (Signed)
 Complex Care Management Note Care Guide Note  01/28/2024 Name: Cheryl Mercer MRN: [id number] DOB: November 16, 1985   Complex Care Management Outreach Attempts: A third unsuccessful outreach was attempted today to offer the patient with information about available complex care management services.  Follow Up Plan:  No further outreach attempts will be made at this time. We have been unable to contact the patient to offer or enroll patient in complex care management services.  Encounter Outcome:  No Answer  Lenton Rail , RMA     [place]  [facility], [place] Guide  Direct Dial : [phone]  Website: [email or web address]

## 2024-01-29 ENCOUNTER — Encounter: Payer: Self-pay | Admitting: General Practice

## 2024-01-29 ENCOUNTER — Telehealth (INDEPENDENT_AMBULATORY_CARE_PROVIDER_SITE_OTHER): Admitting: General Practice

## 2024-01-29 ENCOUNTER — Other Ambulatory Visit: Payer: Self-pay | Admitting: General Practice

## 2024-01-29 DIAGNOSIS — F419 Anxiety disorder, unspecified: Secondary | ICD-10-CM | POA: Diagnosis not present

## 2024-01-29 DIAGNOSIS — F32A Depression, unspecified: Secondary | ICD-10-CM | POA: Diagnosis not present

## 2024-01-29 DIAGNOSIS — E119 Type 2 diabetes mellitus without complications: Secondary | ICD-10-CM

## 2024-01-29 DIAGNOSIS — Z794 Long term (current) use of insulin: Secondary | ICD-10-CM | POA: Diagnosis not present

## 2024-01-29 MED ORDER — FREESTYLE LIBRE 3 SENSOR MISC: 2 refills | Status: DC

## 2024-01-29 MED ORDER — SERTRALINE HCL 100 MG PO TABS
100.0000 mg | ORAL_TABLET | Freq: Every day | ORAL | 0 refills | Status: AC
Start: 2024-01-29 — End: ?

## 2024-01-29 NOTE — Assessment & Plan Note (Signed)
 Uncontrolled.  Scheduled to see endocrinology on August 28. Has been monitoring her diet and exercising.  Discussed medication adherence and asked patient to continue Lantus  35 units once daily.  And sliding scale for meal coverage.  Follow-up in 8 weeks or sooner if needed.

## 2024-01-29 NOTE — Assessment & Plan Note (Signed)
 Uncontrolled. Declines therapy at this time.  Increase sertraline  from 50 mg to 100 mg once daily at bedtime.  Rx sent. Follow-up in 8 weeks or sooner if needed.

## 2024-01-29 NOTE — Patient Instructions (Signed)
 Start sertraline  100 mg at bedtime once daily. Refill sent.   Prescription sent for free style libre.  Make sure you are taking your insulin  as prescribed.   F/u in 8 weeks.   It was a pleasure to see you today!

## 2024-01-29 NOTE — Progress Notes (Signed)
 Virtual Visit via Video Note  I connected with Cheryl Mercer on 01/29/24 at 12:20 PM EDT by a video enabled telemedicine application and verified that I am speaking with the correct person using two identifiers.  Patient Location: Home Provider Location: Office/Clinic  I discussed the limitations, risks, security, and privacy concerns of performing an evaluation and management service by video and the availability of in person appointments. I also discussed with the patient that there may be a patient responsible charge related to this service. The patient expressed understanding and agreed to proceed.  Subjective: PCP: [name], NP  Chief Complaint  Patient presents with   Depression    Still not sleeping well. Brain is running nonstop.    Depression         Cheryl Mercer is a 38 year old female with past medical history of f CHF, dilated cardiomyopathy, hypertension, pulmonary infarct, type II diabetic insulin -dependent, hyperlipidemia, PE presents today for follow up on anxiety and depression.  Anxiety and depression: She was evaluated on 01/05/2024 when she was started on sertraline  50 mg once daily at bedtime.  She reports that she has been taking her medication daily and has recently run out.  She is still feeling anxious all the time and worries all the time she is still having mind racing thoughts feels like her brain is running nonstop.  This is causing her to not sleep well at night.  She denies SI/HI.   Dm 2- currently managed lantus  30 units daily. Usually takes 2-3 units for meal coverage. Home readings 90s-120s in the morning. Before meals it is usually in the 200s. She takes 2-3 units for meal coverage. Has not been able to use the [place] since she dropped hers in the shower.  She has been checking her blood sugars via fingerstick.  She denies any hypoglycemic events.  She is scheduled to see endocrinology in August.  ROS: Per HPI  Current Outpatient  Medications:    amLODipine  (NORVASC ) 5 MG tablet, Take 1 tablet (5 mg total) by mouth daily., Disp: 90 tablet, Rfl: 4   atorvastatin  (LIPITOR) 40 MG tablet, Take 1 tablet (40 mg total) by mouth daily., Disp: 90 tablet, Rfl: 4   Blood Glucose Monitoring Suppl (BLOOD GLUCOSE MONITOR SYSTEM) w/Device KIT, Use 3 (three) times daily., Disp: 1 kit, Rfl: 0   Blood Pressure Monitoring (BLOOD PRESSURE MONITOR/ARM) DEVI, Use to check BP daily. Dx. I10, Disp: 1 each, Rfl: 0   Continuous Glucose Sensor (FREESTYLE LIBRE 3 SENSOR) MISC, Place 1 sensor on the skin every 15 days. Use to check glucose continuously, Disp: 2 each, Rfl: 2   digoxin  (LANOXIN ) 0.125 MG tablet, Take 1 tablet (0.125 mg total) by mouth daily., Disp: 90 tablet, Rfl: 4   Glucose Blood (BLOOD GLUCOSE TEST STRIPS) STRP, Use 3 (three) times daily. Use as directed to check blood sugar., Disp: 100 strip, Rfl: 0   insulin  glargine (LANTUS ) 100 UNIT/ML Solostar Pen, Inject 35 Units into the skin daily., Disp: 15 mL, Rfl: 0   insulin  lispro (HUMALOG  KWIKPEN) 100 UNIT/ML KwikPen, Inject 2-15 Units into the skin 3 (three) times daily after meals. CBG 121 - 150: 2 units, 151 - 200: 3 units, 201 - 250: 5 units, 251 - 300: 8 units, 301 - 350: 11 units, 351 - 400: 15u, Disp: 15 mL, Rfl: 0   Insulin  Pen Needle 31G X 8 MM MISC, Use to inject insulin  as directed., Disp: 100 each, Rfl: 0  Lancet Device MISC, Use 3 (three) times daily., Disp: 1 each, Rfl: 0   Lancets MISC, Use 3 (three) times daily. Use as directed to check blood sugar., Disp: 100 each, Rfl: 0   sacubitril -valsartan  (ENTRESTO ) 97-103 MG, Take 1 tablet by mouth 2 (two) times daily., Disp: 180 tablet, Rfl: 4   spironolactone  (ALDACTONE ) 25 MG tablet, Take 1 tablet (25 mg total) by mouth daily., Disp: 90 tablet, Rfl: 4   torsemide  (DEMADEX ) 20 MG tablet, Take 2 tablets (40 mg total) by mouth daily., Disp: 180 tablet, Rfl: 4   sertraline  (ZOLOFT ) 100 MG tablet, Take 1 tablet (100 mg total) by mouth  daily., Disp: 90 tablet, Rfl: 0  Observations/Objective: There were no vitals filed for this visit. Physical Exam Nursing note reviewed.  Constitutional:      Appearance: Normal appearance.   Eyes:     Conjunctiva/sclera: Conjunctivae normal.   Pulmonary:     Effort: Pulmonary effort is normal.   Neurological:     Mental Status: She is alert and oriented to person, place, and time.   Psychiatric:        Mood and Affect: Mood normal.        Behavior: Behavior normal.        Thought Content: Thought content normal.        Judgment: Judgment normal.     Assessment and Plan: Diabetes mellitus type 2, insulin  dependent (HCC) Assessment & Plan: Uncontrolled.  Scheduled to see endocrinology on August 28. Has been monitoring her diet and exercising.  Discussed medication adherence and asked patient to continue Lantus  35 units once daily.  And sliding scale for meal coverage.  Follow-up in 8 weeks or sooner if needed.  Orders: -     FreeStyle Libre 3 Sensor; Place 1 sensor on the skin every 15 days. Use to check glucose continuously  Dispense: 2 each; Refill: 2  Anxiety and depression Assessment & Plan: Uncontrolled. Declines therapy at this time.  Increase sertraline  from 50 mg to 100 mg once daily at bedtime.  Rx sent. Follow-up in 8 weeks or sooner if needed.  Orders: -     Sertraline  HCl; Take 1 tablet (100 mg total) by mouth daily.  Dispense: 90 tablet; Refill: 0    Follow Up Instructions: Return in about 2 months (around 03/30/2024) for diabetes and anxiety f/u.   I discussed the assessment and treatment plan with the patient. The patient was provided an opportunity to ask questions, and all were answered. The patient agreed with the plan and demonstrated an understanding of the instructions.   The patient was advised to call back or seek an in-person evaluation if the symptoms worsen or if the condition fails to improve as anticipated.  The above assessment  and management plan was discussed with the patient. The patient verbalized understanding of and has agreed to the management plan.   [name], NP

## 2024-02-01 ENCOUNTER — Telehealth: Payer: Self-pay

## 2024-02-01 NOTE — Transitions of Care (Post Inpatient/ED Visit) (Signed)
   02/01/2024  Name: Cheryl Mercer MRN: [id number] DOB: 1986/04/11  Today's TOC FU Call Status:  Enrolled Week 4 Today's TOC FU Call Status:: Unsuccessful Call (2nd Attempt) Unsuccessful Call (2nd Attempt) Date: 02/01/24  Attempted to reach the patient regarding the most recent Inpatient/ED visit.  Follow Up Plan: Additional outreach attempts will be made to reach the patient to complete the Transitions of Care (Post Inpatient/ED visit) call.   [place], RN, BSN, CCM [facility], [facility] RN Care Manager Direct Dial : [phone]

## 2024-02-15 NOTE — Progress Notes (Signed)
 ADVANCED HF CLINIC NOTE  Primary Care: Vincente Shivers, NP Primary Cardiologist: Dr. Cherrie Reason for Visit: Heart Failure Follow-up  HPI: [name] is a 38 y.o.  hispanic female with chronic systolic heart failure, h/o PE, migraines, hypertension, and poorly controlled T2DM. She has previously no showed to multiple AHF clinic visits. Has been admitted several time with CHF exacerbations 2/2 med noncompliance.    Admitted 7/24 with a/c HF. Echo showed EF < 20%. RV severely reduced. Mild concentric LVH, LV with GHK, smoke seen in LA, RA severely dilated, mild MR. Felt to be 2/2 to ETOH, HTN and medication noncompliance. GDMT titrated and she was discharged home.   Directly admitted from [facility] Clinic for hyperglycemia and CHF exacerbation d/t medication noncompliance. Resumed on GDMT.  She returns today for heart failure follow up with son. Overall feeling better since last admission. NYHA III. Reports dizziness, dyspnea and fatigue. Denies chest pain, lower extremity edema, near-syncope, orthopnea, palpitations, and abnormal bleeding. Able to perform ADLs. Appetite okay, has been eating low sugar, low sodium diet. Has stopped drinking ETOH. Reports the blood sugars have been better controlled. Will start checking her BP and weight at home. Compliant with all medications.   Cardiac studies - Echo (7/24): EF < 20% - Echo 10/23 EF <20%, RV severely reduced. Mild concentric LVH, LV with GHK, smoke seen in LA, RA severely dilated, mild MR - Echo 5/23 EF 25-30%, GIIIDD, RV mod reduced, LA/RA mildly dilated - Echo 10/22 EF 30-35%, normal RV - Echo (2021): EF at 10-15% and moderately reduced RV - RHC/LHC 03/25/19 showed no CAD. Well compensated hemodynamics with low filling pressures.  - cMRI (8/20): w/ EF at 15% and no significant LGE   Past Medical History:  Diagnosis Date   Arthritis    Chronic back pain    Chronic combined systolic and diastolic congestive heart failure (HCC) 09/07/2019    Depression    Diabetes mellitus without complication (HCC)    Fainting spell    History of COVID-19 11/07/2019   Migraines    Peripheral neuropathy    Pregnancy induced hypertension    Pulmonary embolus (HCC) 12/16/2019   Pulmonary infarct (HCC) 12/17/2019   Rhinovirus    Sepsis (HCC) 12/31/2021   Tachycardia    Viral pneumonia    Current Outpatient Medications  Medication Sig Dispense Refill   amLODipine  (NORVASC ) 5 MG tablet Take 1 tablet (5 mg total) by mouth daily. 90 tablet 4   atorvastatin  (LIPITOR) 40 MG tablet Take 1 tablet (40 mg total) by mouth daily. 90 tablet 4   Blood Glucose Monitoring Suppl (BLOOD GLUCOSE MONITOR SYSTEM) w/Device KIT Use 3 (three) times daily. 1 kit 0   Blood Pressure Monitoring (BLOOD PRESSURE MONITOR/ARM) DEVI Use to check BP daily. Dx. I10 1 each 0   Continuous Glucose Sensor (FREESTYLE LIBRE 3 SENSOR) MISC 1 each by Other route every 14 (fourteen) days. 2 each 2   digoxin  (LANOXIN ) 0.125 MG tablet Take 1 tablet (0.125 mg total) by mouth daily. 90 tablet 4   Glucose Blood (BLOOD GLUCOSE TEST STRIPS) STRP Use 3 (three) times daily. Use as directed to check blood sugar. 100 strip 0   insulin  glargine (LANTUS ) 100 UNIT/ML Solostar Pen Inject 35 Units into the skin daily. 15 mL 0   insulin  lispro (HUMALOG  KWIKPEN) 100 UNIT/ML KwikPen Inject 2-15 Units into the skin 3 (three) times daily after meals. CBG 121 - 150: 2 units, 151 - 200: 3 units, 201 - 250: 5  units, 251 - 300: 8 units, 301 - 350: 11 units, 351 - 400: 15u 15 mL 0   Insulin  Pen Needle 31G X 8 MM MISC Use to inject insulin  as directed. 100 each 0   Lancet Device MISC Use 3 (three) times daily. 1 each 0   Lancets MISC Use 3 (three) times daily. Use as directed to check blood sugar. 100 each 0   sacubitril -valsartan  (ENTRESTO ) 97-103 MG Take 1 tablet by mouth 2 (two) times daily. 180 tablet 4   sertraline  (ZOLOFT ) 100 MG tablet Take 1 tablet (100 mg total) by mouth daily. 90 tablet 0    spironolactone  (ALDACTONE ) 25 MG tablet Take 1 tablet (25 mg total) by mouth daily. 90 tablet 4   torsemide  (DEMADEX ) 20 MG tablet Take 2 tablets (40 mg total) by mouth daily. 180 tablet 4   No current facility-administered medications for this visit.   Allergies  Allergen Reactions   Ibuprofen  Anaphylaxis   Social History   Socioeconomic History   Marital status: Married    Spouse name: Not on file   Number of children: 4   Years of education: Not on file   Highest education level: High school graduate  Occupational History   Not on file  Tobacco Use   Smoking status: Former    Types: Cigarettes   Smokeless tobacco: Never  Vaping Use   Vaping status: Some Days  Substance and Sexual Activity   Alcohol use: Yes    Alcohol/week: 6.0 standard drinks of alcohol    Types: 6 Shots of liquor per week   Drug use: Not Currently    Types: Marijuana   Sexual activity: Yes    Birth control/protection: Surgical    Comment: band placed around tubes in 2009 surgically by Dr. Edsel  Other Topics Concern   Not on file  Social History Narrative   Not on file   Social Drivers of Health   Financial Resource Strain: High Risk (12/21/2023)   Overall Financial Resource Strain (CARDIA)    Difficulty of Paying Living Expenses: Hard  Food Insecurity: Food Insecurity Present (12/28/2023)   Hunger Vital Sign    Worried About Running Out of Food in the Last Year: Sometimes true    Ran Out of Food in the Last Year: Sometimes true  Transportation Needs: No Transportation Needs (12/28/2023)   PRAPARE - Administrator, Civil Service (Medical): No    Lack of Transportation (Non-Medical): No  Physical Activity: Insufficiently Active (12/21/2023)   Exercise Vital Sign    Days of Exercise per Week: 7 days    Minutes of Exercise per Session: 20 min  Stress: No Stress Concern Present (12/21/2023)   [employer] of Occupational Health - Occupational Stress Questionnaire    Feeling of  Stress : Not at all  Social Connections: Unknown (12/21/2023)   Social Connection and Isolation Panel    Frequency of Communication with Friends and Family: More than three times a week    Frequency of Social Gatherings with Friends and Family: More than three times a week    Attends Religious Services: More than 4 times per year    Active Member of [employer] or Organizations: Yes    Attends Banker Meetings: More than 4 times per year    Marital Status: Patient declined  Intimate Partner Violence: Not At Risk (12/28/2023)   Humiliation, Afraid, Rape, and Kick questionnaire    Fear of Current or Ex-Partner: No    Emotionally  Abused: No    Physically Abused: No    Sexually Abused: No   Family History  Problem Relation Age of Onset   Stroke Mother    Miscarriages / [place] Mother    Depression Mother    Cancer Mother    Arthritis Mother    Hypertension Mother    Heart attack Mother    Mental illness Mother    Hypertension Father    Hyperlipidemia Father    Heart disease Father    Early death Father    Drug abuse Father    Depression Father    Alcohol abuse Father    Asthma Brother    Hypertension Maternal Grandmother    Heart attack Maternal Grandmother    Early death Maternal Grandmother    Diabetes Maternal Grandmother    COPD Maternal Grandmother    Cancer Maternal Grandmother    Arthritis Maternal Grandmother    Stroke Maternal Grandmother    Hypertension Maternal Grandfather    Hyperlipidemia Maternal Grandfather    Heart disease Maternal Grandfather    Heart attack Maternal Grandfather    Early death Maternal Grandfather    Diabetes Maternal Grandfather    COPD Maternal Grandfather    Arthritis Maternal Grandfather    Stroke Maternal Grandfather    Hypertension Paternal Grandmother    Hyperlipidemia Paternal Grandmother    Early death Paternal Grandmother    COPD Paternal Grandmother    Miscarriages / Stillbirths Paternal Grandmother    Stroke  Paternal Grandmother    Kidney disease Paternal Grandfather    Hypertension Paternal Grandfather    Hyperlipidemia Paternal Grandfather    Heart attack Paternal Grandfather    Early death Paternal Grandfather    Anesthesia problems Neg Hx    There were no vitals taken for this visit.  Wt Readings from Last 3 Encounters:  01/13/24 62.1 kg (137 lb)  01/05/24 62.1 kg (137 lb)  12/25/23 60.3 kg (132 lb 15 oz)   PHYSICAL EXAM: General: Ashen appearing. No distress on RA. Walked into clinic Cardiac: JVP ~8cm. S1 and S2 present. No murmurs or rub. Resp: Lung sounds clear and equal B/L Abdomen: Soft, non-tender, non-distended.  Extremities: Warm and dry.  No peripheral edema.  Neuro: Alert and oriented x3. Affect pleasant.   ECG (personally reviewed): NSR 90 bpm  ReDs reading: 38 %, abnormal  ASSESSMENT & PLAN:  1. Acute on Chronic biventricular systolic heart failure - Echo (10/23): EF <20%, RV severely reduced. Mild concentric LVH, LV with GHK, smoke seen in LA, RA severely dilated, mild MR - Echo (7/24): EF < 20%, RV moderately reduced  - Suspect CM 2/2 uncontrolled hypertension, ETOH, medication noncompliance and DM. Remains an issue.  - NYHA III. Volume up by ReDs and exam.  - Continue Torsemide  40 mg daily, has not been making much urine with this recently. Increase to 80 mg Torsemide  x3 days, then resume 40 mg.  - Continue Entresto  97/103 mg bid - Continue spiro 25 mg daily - Continue digoxin  0.125 mg daily - Will hold on adding beta blocker with frequent dizziness and hypervolemia - No SGLT2i, A1c 12.9, restart at next appointment if BS remains controlled - not candidate for ICD or advanced therapies with noncompliance - her and family are currently working hard on compliance   2. HTN - continue norvasc  5 mg daily   3. DM II - Last A1C 12.9 - Has been compliant with insulin , does have issues with BG swings overnight.   4. H/o  PE w/ pulmonary infarct - treated with  eliquis  x 6 months 4/21   5. Alcohol use - reports abstinence   6. HLD - continue atorva 40 mg daily. Will check Lipid panel at next visit.   Follow up in 3-4 weeks with APP  Harlene CHRISTELLA Gainer, FNP  1:05 PM

## 2024-02-16 ENCOUNTER — Telehealth (HOSPITAL_COMMUNITY): Payer: Self-pay

## 2024-02-16 NOTE — Telephone Encounter (Signed)
 Called to confirm/remind patient of their appointment at the [facility] on 02/17/24.   Appointment:   [x] Confirmed  [] Left mess   [] No answer/No voice mail  [] VM Full/unable to leave message  [] Phone not in service  Patient reminded to bring all medications and/or complete list.  Confirmed patient has transportation. Gave directions, instructed to utilize valet parking.

## 2024-02-17 ENCOUNTER — Encounter (HOSPITAL_COMMUNITY): Payer: Self-pay

## 2024-02-17 ENCOUNTER — Ambulatory Visit (HOSPITAL_COMMUNITY)
Admission: RE | Admit: 2024-02-17 | Discharge: 2024-02-17 | Disposition: A | Discharge status: Home / Self Care | Source: Ambulatory Visit | Attending: Family Medicine | Admitting: Family Medicine

## 2024-02-17 ENCOUNTER — Ambulatory Visit (HOSPITAL_COMMUNITY): Payer: Self-pay | Admitting: Family Medicine

## 2024-02-17 VITALS — BP 170/100 | HR 105 | Wt 135.2 lb

## 2024-02-17 DIAGNOSIS — E785 Hyperlipidemia, unspecified: Secondary | ICD-10-CM | POA: Insufficient documentation

## 2024-02-17 DIAGNOSIS — F32A Depression, unspecified: Secondary | ICD-10-CM | POA: Insufficient documentation

## 2024-02-17 DIAGNOSIS — R0789 Other chest pain: Secondary | ICD-10-CM | POA: Diagnosis not present

## 2024-02-17 DIAGNOSIS — Z5986 Financial insecurity: Secondary | ICD-10-CM | POA: Insufficient documentation

## 2024-02-17 DIAGNOSIS — Z87891 Personal history of nicotine dependence: Secondary | ICD-10-CM | POA: Diagnosis not present

## 2024-02-17 DIAGNOSIS — I10 Essential (primary) hypertension: Secondary | ICD-10-CM

## 2024-02-17 DIAGNOSIS — R42 Dizziness and giddiness: Secondary | ICD-10-CM | POA: Insufficient documentation

## 2024-02-17 DIAGNOSIS — I110 Hypertensive heart disease with heart failure: Secondary | ICD-10-CM | POA: Diagnosis not present

## 2024-02-17 DIAGNOSIS — Z79899 Other long term (current) drug therapy: Secondary | ICD-10-CM | POA: Diagnosis not present

## 2024-02-17 DIAGNOSIS — I5022 Chronic systolic (congestive) heart failure: Secondary | ICD-10-CM

## 2024-02-17 DIAGNOSIS — E119 Type 2 diabetes mellitus without complications: Secondary | ICD-10-CM

## 2024-02-17 DIAGNOSIS — F1011 Alcohol abuse, in remission: Secondary | ICD-10-CM

## 2024-02-17 DIAGNOSIS — I5082 Biventricular heart failure: Secondary | ICD-10-CM | POA: Diagnosis not present

## 2024-02-17 DIAGNOSIS — E1165 Type 2 diabetes mellitus with hyperglycemia: Secondary | ICD-10-CM | POA: Diagnosis not present

## 2024-02-17 DIAGNOSIS — Z86711 Personal history of pulmonary embolism: Secondary | ICD-10-CM | POA: Insufficient documentation

## 2024-02-17 DIAGNOSIS — Z9851 Tubal ligation status: Secondary | ICD-10-CM | POA: Diagnosis not present

## 2024-02-17 DIAGNOSIS — Z794 Long term (current) use of insulin: Secondary | ICD-10-CM | POA: Diagnosis not present

## 2024-02-17 LAB — BASIC METABOLIC PANEL WITH GFR
Anion gap: 8 (ref 5–15)
BUN: 20 mg/dL (ref 6–20)
CO2: 24 mmol/L (ref 22–32)
Calcium: 9.3 mg/dL (ref 8.9–10.3)
Chloride: 104 mmol/L (ref 98–111)
Creatinine, Ser: 0.99 mg/dL (ref 0.44–1.00)
GFR, Estimated: >60 mL/min (ref >60)
Glucose, Bld: 244 mg/dL — ABNORMAL HIGH (ref 70–99)
Potassium: 4.3 mmol/L (ref 3.5–5.1)
Sodium: 136 mmol/L (ref 135–145)

## 2024-02-17 LAB — DIGOXIN LEVEL: Digoxin Level: <0.4 ng/mL — ABNORMAL LOW (ref 0.8–2.0)

## 2024-02-17 LAB — BRAIN NATRIURETIC PEPTIDE: B Natriuretic Peptide: 821.4 pg/mL — ABNORMAL HIGH (ref 0.0–100.0)

## 2024-02-17 MED ORDER — CARVEDILOL 12.5 MG PO TABS
12.5000 mg | ORAL_TABLET | Freq: Two times a day (BID) | ORAL | 3 refills | Status: AC
Start: 2024-02-17 — End: 2024-05-17

## 2024-02-17 MED ORDER — POTASSIUM CHLORIDE CRYS ER 20 MEQ PO TBCR: 40.0000 meq | EXTENDED_RELEASE_TABLET | Freq: Every day | ORAL | 3 refills | Status: DC

## 2024-02-17 MED ORDER — AMLODIPINE BESYLATE 10 MG PO TABS: 10.0000 mg | ORAL_TABLET | Freq: Every day | ORAL | 3 refills | Status: DC

## 2024-02-17 MED ORDER — TORSEMIDE 20 MG PO TABS
80.0000 mg | ORAL_TABLET | Freq: Every day | ORAL | 4 refills | Status: AC
Start: 2024-02-17 — End: ?

## 2024-02-17 NOTE — Progress Notes (Signed)
 ReDS Vest / Clip - 02/17/24 1000       ReDS Vest / Clip   Station Marker A    Ruler Value 26.5    ReDS Value Range Moderate volume overload    ReDS Actual Value 38

## 2024-02-17 NOTE — Telephone Encounter (Signed)
 Pt aware, agreeable, and verbalized understanding Med list update and labs ordered and scheduled

## 2024-02-17 NOTE — Patient Instructions (Signed)
 START Carvedilol  12.5 mg Twice daily  Labs done today, your results will be available in MyChart, we will contact you for abnormal readings.  Your physician has requested that you have an echocardiogram. Echocardiography is a painless test that uses sound waves to create images of your heart. It provides your doctor with information about the size and shape of your heart and how well your heart's chambers and valves are working. This procedure takes approximately one hour. There are no restrictions for this procedure. Please do NOT wear cologne, perfume, aftershave, or lotions (deodorant is allowed). Please arrive 15 minutes prior to your appointment time.  Please note: We ask at that you not bring children with you during ultrasound (echo/ vascular) testing. Due to room size and safety concerns, children are not allowed in the ultrasound rooms during exams. Our front office staff cannot provide observation of children in our lobby area while testing is being conducted. An adult accompanying a patient to their appointment will only be allowed in the ultrasound room at the discretion of the ultrasound technician under special circumstances. We apologize for any inconvenience.  Please follow up with our heart failure pharmacist in 3 weeks.  PLEASE CHECK YOUR BLOOD PRESSURE AT HOME AND IF IT IS GREATER THAN 140 SYSTOLIC CALL THE OFFICE.   Your physician recommends that you schedule a follow-up appointment in: 3 months. ( October) ** PLEASE CALL THE OFFICE IN AUGUST TO ARRANGE YOUR FOLLOW UP APPOINTMENT.**  If you have any questions or concerns before your next appointment please send us  a message through [place] or call our office at [phone].    TO LEAVE A MESSAGE FOR THE NURSE SELECT OPTION 2, PLEASE LEAVE A MESSAGE INCLUDING: YOUR NAME DATE OF BIRTH CALL BACK NUMBER REASON FOR CALL**this is important as we prioritize the call backs  YOU WILL RECEIVE A CALL BACK THE SAME DAY AS LONG AS YOU  CALL BEFORE 4:00 PM  At the [facility], you and your health needs are our priority. As part of our continuing mission to provide you with exceptional heart care, we have created designated Provider Care Teams. These Care Teams include your primary Cardiologist (physician) and Advanced Practice Providers (APPs- Physician Assistants and Nurse Practitioners) who all work together to provide you with the care you need, when you need it.   You may see any of the following providers on your designated Care Team at your next follow up: Dr Toribio Fuel Dr Ezra Shuck Dr. Ria Commander Dr. Morene Brownie Amy Lenetta, NP Caffie Shed, [place] [facility] [place], [place] Beckey Coe, NP [place] Lee, NP Ellouise Class, NP Tinnie Redman, PharmD Jaun Bash, PharmD   Please be sure to bring in all your medications bottles to every appointment.    Thank you for choosing [facility]

## 2024-03-02 ENCOUNTER — Other Ambulatory Visit (HOSPITAL_COMMUNITY)

## 2024-03-09 ENCOUNTER — Ambulatory Visit (HOSPITAL_BASED_OUTPATIENT_CLINIC_OR_DEPARTMENT_OTHER): Attending: Cardiology | Admitting: Cardiology

## 2024-03-09 ENCOUNTER — Inpatient Hospital Stay (HOSPITAL_COMMUNITY)
Admission: RE | Admit: 2024-03-09 | Discharge: 2024-03-09 | Disposition: A | Discharge status: Home / Self Care | Source: Ambulatory Visit

## 2024-03-09 NOTE — Progress Notes (Incomplete)
 ***In Progress***    Advanced Heart Failure Clinic Note   Primary Care: Vincente Shivers, NP HF Cardiologist: Dr. Cherrie  HPI: Cheryl Mercer is a 38 y.o. hispanic female with chronic systolic heart failure, h/o PE, migraines, hypertension, and poorly controlled T2DM. She has previously no showed to multiple AHF clinic visits. Has been admitted several time with CHF exacerbations 2/2 med noncompliance.   Admitted 02/2023 with a/c HF. Echo showed EF < 20%. RV severely reduced. Mild concentric LVH, LV with GHK, smoke seen in [place], RA severely dilated, mild MR. Felt to be 2/2 to ETOH, HTN and medication noncompliance. GDMT titrated and she was discharged home on carvedilol  6.25 mg BID, Entresto  97/103 mg BID, spironolactone  25 mg daily, digoxin  0.125 mg daily, and torsemide  40 mg daily.  Direct admit from AHF Clinic 12/21/23 for hyperglycemia and CHF exacerbation 2/2 medication noncompliance. Resumed on GDMT, Entresto  97/103 mg BID, spironolactone  25 mg daily, digoxin  0.125 mg daily, torsemide  40 mg daily, and amlodipine  5 mg daily.   01/13/2024 she returned for heart failure follow up with her son. Overall she reported feeling better since last admission. NYHA III. Reported dizziness, dyspnea and fatigue. Denied chest pain, lower extremity edema, near-syncope, orthopnea, palpitations, and abnormal bleeding. Was able to perform ADLs. Appetite was okay, has been eating low sugar, low sodium diet. Reported that she stopped drinking ETOH. Reported the blood sugars have been better controlled. Said she will start checking her BP and weight at home. Reported compliance with all medications. At visit she reported that she had not been making much urine of late, so she was told to take torsemide  80 mg x 3 days, then resume 40 mg daily.  02/17/2024, returned for HF follow up. Overall felt fair. Reported that she was struggling with depression and life stressors. She had occasional positional dizziness. Rare  atypical CP. She was not SOB walking up steps if she took her time. Denied palpitations, abnormal bleeding, edema, or PND/Orthopnea. Appetite fair. Weight at home 133-135 pounds. Reported taking all medications. No ETOH since 09/2023, no tobacco or drug use.  Today he returns to HF clinic for pharmacist medication titration. At last visit with APP she was started on, torsemide  was increased to 80 mg daily, started on potassium 40 mEq daily, and amlodipine  was increased to 10 mg daily. ***.   Overall feeling ***. Dizziness, lightheadedness, fatigue:  Chest pain or palpitations:  How is your breathing?: *** SOB: Able to complete all ADLs. Activity level ***  Weight at home pounds. Takes furosemide /torsemide /bumex *** mg *** daily.  LEE PND/Orthopnea  Appetite *** Low-salt diet:   Physical Exam Cost/affordability of meds   HF Medications: Entresto  97/103 mg BID Spironolactone  25 mg daily Digoxin  0.125 mg daily *** Amlodipine  5 mg daily *** Torsemide  80 mg daily - has she been doubling up 20/40 mg? - only filled 90 DS in May  Has the patient been experiencing any side effects to the medications prescribed?  {YES NO:22349}  Does the patient have any problems obtaining medications due to transportation or finances?   {YES NO:22349}  Understanding of regimen: {excellent/good/fair/poor:19665} Understanding of indications: {excellent/good/fair/poor:19665} Potential of compliance: {excellent/good/fair/poor:19665} Patient understands to avoid NSAIDs. Patient understands to avoid decongestants.    Pertinent Lab Values: 02/17/2024: Serum creatinine 0.99, BUN 20, Potassium 4.3, Sodium 136, BNP 821.4, Digoxin  <0.4   Vital Signs: Weight: *** (last clinic weight: 135.2 lbs) Blood pressure: *** mmHg Heart rate: *** bpm  Assessment/Plan: Chronic biventricular systolic heart failure - Echo (  05/2022): EF <20%, RV severely reduced. Mild concentric LVH, LV with GHK, smoke seen in LA, RA  severely dilated, mild MR - Echo (02/2023): EF < 20%, RV moderately reduced  - Suspect CM 2/2 uncontrolled hypertension, ETOH, medication noncompliance and DM. Remains an issue.  - NYHA II. Volume OK on exam and ReDs 38% - Start Coreg  12.5 mg bid *** - Continue torsemide  40 mg daily *** - Continue Entresto  97/103 mg bid *** - Continue spiro 25 mg daily *** - Continue digoxin  0.125 mg daily *** - No SGLT2i with A1c 12.9, plan to add back when she has better glycemic control. - not candidate for ICD or advanced therapies with noncompliance - s/p BTL for contraception - Labs today. - Repeat echo in 3 months with improved med compliance ***   2. HTN - BP uncontrolled. - Starting beta blocker as above - Continue amlodipine  5 mg daily for now, may be able to stop with up-titration of Coreg  - Check BP daily and log, notify clinic if sBP > 140 - Has been referred to sleep medicine, Dr. Shlomo   3. DM II - Last A1C 12.9 - No SGLT2i yet - Management per PCP, has been referred to Endo   4. H/o PE w/ pulmonary infarct - Treated with Eliquis  x 6 months 4/21   5. Alcohol use - Reports abstinence - Congratulated.   6. HLD - Continue atorva 40 mg daily.  - Check lipids today  Follow up in 2 months with MD + ECHO at this time.   Tinnie Redman, PharmD, BCPS, BCCP, CPP Heart Failure Clinic Pharmacist [phone]      ***   Cardiac studies - Echo 7/24: EF < 20% - Echo 10/23 EF < 20%, RV severely reduced. Mild concentric LVH, LV with GHK, smoke seen in [place], RA severely dilated, mild MR - Echo 5/23 EF 25-30%, GIIIDD, RV mod reduced, LA/RA mildly dilated - Echo 10/22 EF 30-35%, normal RV - Echo 2021: EF 10-15% and moderately reduced RV - RHC/LHC 03/25/19 showed no CAD. Well compensated hemodynamics with low filling pressures.  - cMRI 8/20: LVEF at 15% and no significant LGE

## 2024-03-17 ENCOUNTER — Other Ambulatory Visit (HOSPITAL_COMMUNITY): Payer: Self-pay | Admitting: Internal Medicine

## 2024-04-14 ENCOUNTER — Ambulatory Visit: Admitting: Endocrinology

## 2024-05-02 ENCOUNTER — Other Ambulatory Visit (HOSPITAL_COMMUNITY): Payer: Self-pay | Admitting: Cardiology

## 2024-05-02 ENCOUNTER — Other Ambulatory Visit (HOSPITAL_COMMUNITY): Payer: Self-pay

## 2024-05-12 ENCOUNTER — Inpatient Hospital Stay (HOSPITAL_COMMUNITY)
Admission: EM | Admit: 2024-05-12 | Discharge: 2024-05-16 | DRG: 291 | Disposition: A | Discharge status: Home / Self Care | Attending: Internal Medicine | Admitting: Internal Medicine

## 2024-05-12 ENCOUNTER — Emergency Department (HOSPITAL_COMMUNITY)

## 2024-05-12 ENCOUNTER — Other Ambulatory Visit: Payer: Self-pay

## 2024-05-12 DIAGNOSIS — Z8616 Personal history of COVID-19: Secondary | ICD-10-CM | POA: Diagnosis not present

## 2024-05-12 DIAGNOSIS — Z833 Family history of diabetes mellitus: Secondary | ICD-10-CM | POA: Diagnosis not present

## 2024-05-12 DIAGNOSIS — Z8249 Family history of ischemic heart disease and other diseases of the circulatory system: Secondary | ICD-10-CM

## 2024-05-12 DIAGNOSIS — R059 Cough, unspecified: Secondary | ICD-10-CM | POA: Diagnosis not present

## 2024-05-12 DIAGNOSIS — I959 Hypotension, unspecified: Secondary | ICD-10-CM | POA: Diagnosis not present

## 2024-05-12 DIAGNOSIS — R0781 Pleurodynia: Secondary | ICD-10-CM | POA: Diagnosis not present

## 2024-05-12 DIAGNOSIS — Z83438 Family history of other disorder of lipoprotein metabolism and other lipidemia: Secondary | ICD-10-CM

## 2024-05-12 DIAGNOSIS — R509 Fever, unspecified: Secondary | ICD-10-CM | POA: Diagnosis not present

## 2024-05-12 DIAGNOSIS — F17290 Nicotine dependence, other tobacco product, uncomplicated: Secondary | ICD-10-CM | POA: Diagnosis present

## 2024-05-12 DIAGNOSIS — I5043 Acute on chronic combined systolic (congestive) and diastolic (congestive) heart failure: Secondary | ICD-10-CM | POA: Diagnosis not present

## 2024-05-12 DIAGNOSIS — Z794 Long term (current) use of insulin: Secondary | ICD-10-CM | POA: Diagnosis not present

## 2024-05-12 DIAGNOSIS — I428 Other cardiomyopathies: Secondary | ICD-10-CM | POA: Diagnosis not present

## 2024-05-12 DIAGNOSIS — Z91128 Patient's intentional underdosing of medication regimen for other reason: Secondary | ICD-10-CM

## 2024-05-12 DIAGNOSIS — Z813 Family history of other psychoactive substance abuse and dependence: Secondary | ICD-10-CM

## 2024-05-12 DIAGNOSIS — Z818 Family history of other mental and behavioral disorders: Secondary | ICD-10-CM

## 2024-05-12 DIAGNOSIS — I509 Heart failure, unspecified: Secondary | ICD-10-CM

## 2024-05-12 DIAGNOSIS — Z86711 Personal history of pulmonary embolism: Secondary | ICD-10-CM

## 2024-05-12 DIAGNOSIS — Y92009 Unspecified place in unspecified non-institutional (private) residence as the place of occurrence of the external cause: Secondary | ICD-10-CM | POA: Diagnosis not present

## 2024-05-12 DIAGNOSIS — J129 Viral pneumonia, unspecified: Secondary | ICD-10-CM | POA: Diagnosis present

## 2024-05-12 DIAGNOSIS — Z79899 Other long term (current) drug therapy: Secondary | ICD-10-CM

## 2024-05-12 DIAGNOSIS — N179 Acute kidney failure, unspecified: Secondary | ICD-10-CM | POA: Diagnosis not present

## 2024-05-12 DIAGNOSIS — Z823 Family history of stroke: Secondary | ICD-10-CM

## 2024-05-12 DIAGNOSIS — Z8261 Family history of arthritis: Secondary | ICD-10-CM

## 2024-05-12 DIAGNOSIS — I110 Hypertensive heart disease with heart failure: Secondary | ICD-10-CM | POA: Diagnosis not present

## 2024-05-12 DIAGNOSIS — I5023 Acute on chronic systolic (congestive) heart failure: Secondary | ICD-10-CM | POA: Diagnosis not present

## 2024-05-12 DIAGNOSIS — Z825 Family history of asthma and other chronic lower respiratory diseases: Secondary | ICD-10-CM

## 2024-05-12 DIAGNOSIS — T502X5A Adverse effect of carbonic-anhydrase inhibitors, benzothiadiazides and other diuretics, initial encounter: Secondary | ICD-10-CM | POA: Diagnosis not present

## 2024-05-12 DIAGNOSIS — F32A Depression, unspecified: Secondary | ICD-10-CM | POA: Diagnosis present

## 2024-05-12 DIAGNOSIS — I5084 End stage heart failure: Secondary | ICD-10-CM | POA: Diagnosis present

## 2024-05-12 DIAGNOSIS — Z5986 Financial insecurity: Secondary | ICD-10-CM

## 2024-05-12 DIAGNOSIS — E669 Obesity, unspecified: Secondary | ICD-10-CM | POA: Diagnosis present

## 2024-05-12 DIAGNOSIS — R058 Other specified cough: Secondary | ICD-10-CM | POA: Diagnosis not present

## 2024-05-12 DIAGNOSIS — E11649 Type 2 diabetes mellitus with hypoglycemia without coma: Secondary | ICD-10-CM

## 2024-05-12 DIAGNOSIS — Y92239 Unspecified place in hospital as the place of occurrence of the external cause: Secondary | ICD-10-CM | POA: Diagnosis not present

## 2024-05-12 DIAGNOSIS — Z886 Allergy status to analgesic agent status: Secondary | ICD-10-CM | POA: Diagnosis not present

## 2024-05-12 DIAGNOSIS — Z5948 Other specified lack of adequate food: Secondary | ICD-10-CM

## 2024-05-12 DIAGNOSIS — Z811 Family history of alcohol abuse and dependence: Secondary | ICD-10-CM

## 2024-05-12 DIAGNOSIS — E1165 Type 2 diabetes mellitus with hyperglycemia: Secondary | ICD-10-CM | POA: Diagnosis present

## 2024-05-12 DIAGNOSIS — Z6827 Body mass index (BMI) 27.0-27.9, adult: Secondary | ICD-10-CM

## 2024-05-12 DIAGNOSIS — B9789 Other viral agents as the cause of diseases classified elsewhere: Secondary | ICD-10-CM | POA: Diagnosis present

## 2024-05-12 DIAGNOSIS — T383X6A Underdosing of insulin and oral hypoglycemic [antidiabetic] drugs, initial encounter: Secondary | ICD-10-CM | POA: Diagnosis present

## 2024-05-12 DIAGNOSIS — I517 Cardiomegaly: Secondary | ICD-10-CM | POA: Diagnosis not present

## 2024-05-12 DIAGNOSIS — R918 Other nonspecific abnormal finding of lung field: Secondary | ICD-10-CM | POA: Diagnosis not present

## 2024-05-12 DIAGNOSIS — I10 Essential (primary) hypertension: Secondary | ICD-10-CM

## 2024-05-12 DIAGNOSIS — J189 Pneumonia, unspecified organism: Secondary | ICD-10-CM

## 2024-05-12 DIAGNOSIS — G43909 Migraine, unspecified, not intractable, without status migrainosus: Secondary | ICD-10-CM | POA: Diagnosis present

## 2024-05-12 DIAGNOSIS — Z23 Encounter for immunization: Secondary | ICD-10-CM

## 2024-05-12 LAB — URINALYSIS, ROUTINE W REFLEX MICROSCOPIC
Bilirubin Urine: NEGATIVE
Glucose, UA: >=500 mg/dL — AB
Ketones, ur: NEGATIVE mg/dL
Leukocytes,Ua: NEGATIVE
Nitrite: NEGATIVE
Protein, ur: >=300 mg/dL — AB
Specific Gravity, Urine: 1.029 (ref 1.005–1.030)
pH: 6 (ref 5.0–8.0)

## 2024-05-12 LAB — COMPREHENSIVE METABOLIC PANEL WITH GFR
ALT: 20 U/L (ref 0–44)
AST: 50 U/L — ABNORMAL HIGH (ref 15–41)
Albumin: 1.7 g/dL — ABNORMAL LOW (ref 3.5–5.0)
Alkaline Phosphatase: 244 U/L — ABNORMAL HIGH (ref 38–126)
Anion gap: 11 (ref 5–15)
BUN: 12 mg/dL (ref 6–20)
CO2: 26 mmol/L (ref 22–32)
Calcium: 8.6 mg/dL — ABNORMAL LOW (ref 8.9–10.3)
Chloride: 91 mmol/L — ABNORMAL LOW (ref 98–111)
Creatinine, Ser: 1.04 mg/dL — ABNORMAL HIGH (ref 0.44–1.00)
GFR, Estimated: >60 mL/min (ref >60)
Glucose, Bld: 554 mg/dL (ref 70–99)
Potassium: 4.5 mmol/L (ref 3.5–5.1)
Sodium: 128 mmol/L — ABNORMAL LOW (ref 135–145)
Total Bilirubin: 0.8 mg/dL (ref 0.0–1.2)
Total Protein: 6.3 g/dL — ABNORMAL LOW (ref 6.5–8.1)

## 2024-05-12 LAB — RESP PANEL BY RT-PCR (RSV, FLU A&B, COVID)  RVPGX2
Influenza A by PCR: NEGATIVE
Influenza B by PCR: NEGATIVE
Resp Syncytial Virus by PCR: NEGATIVE
SARS Coronavirus 2 by RT PCR: NEGATIVE

## 2024-05-12 LAB — CBC
HCT: 35.2 % — ABNORMAL LOW (ref 36.0–46.0)
HCT: 34 % — ABNORMAL LOW (ref 36.0–46.0)
Hemoglobin: 12.1 g/dL (ref 12.0–15.0)
Hemoglobin: 11.9 g/dL — ABNORMAL LOW (ref 12.0–15.0)
MCH: 30.6 pg (ref 26.0–34.0)
MCH: 30.9 pg (ref 26.0–34.0)
MCHC: 34.4 g/dL (ref 30.0–36.0)
MCHC: 35 g/dL (ref 30.0–36.0)
MCV: 89.1 fL (ref 80.0–100.0)
MCV: 88.3 fL (ref 80.0–100.0)
Platelets: 324 K/uL (ref 150–400)
Platelets: 330 K/uL (ref 150–400)
RBC: 3.95 MIL/uL (ref 3.87–5.11)
RBC: 3.85 MIL/uL — ABNORMAL LOW (ref 3.87–5.11)
RDW: 12.7 % (ref 11.5–15.5)
RDW: 12.6 % (ref 11.5–15.5)
WBC: 14.6 K/uL — ABNORMAL HIGH (ref 4.0–10.5)
WBC: 15.9 K/uL — ABNORMAL HIGH (ref 4.0–10.5)
nRBC: 0 % (ref 0.0–0.2)
nRBC: 0 % (ref 0.0–0.2)

## 2024-05-12 LAB — RESPIRATORY PANEL BY PCR

## 2024-05-12 LAB — PROCALCITONIN: Procalcitonin: 0.24 ng/mL

## 2024-05-12 LAB — CREATININE, SERUM
Creatinine, Ser: 1.09 mg/dL — ABNORMAL HIGH (ref 0.44–1.00)
GFR, Estimated: >60 mL/min (ref >60)

## 2024-05-12 LAB — TROPONIN I (HIGH SENSITIVITY)
Troponin I (High Sensitivity): 20 ng/L — ABNORMAL HIGH (ref <18)
Troponin I (High Sensitivity): 32 ng/L — ABNORMAL HIGH (ref <18)

## 2024-05-12 LAB — GLUCOSE, CAPILLARY
Glucose-Capillary: 276 mg/dL — ABNORMAL HIGH (ref 70–99)
Glucose-Capillary: 260 mg/dL — ABNORMAL HIGH (ref 70–99)

## 2024-05-12 LAB — LIPASE, BLOOD: Lipase: 41 U/L (ref 11–51)

## 2024-05-12 LAB — HIV ANTIBODY (ROUTINE TESTING W REFLEX): HIV Screen 4th Generation wRfx: NONREACTIVE

## 2024-05-12 LAB — DIGOXIN LEVEL: Digoxin Level: <0.6 ng/mL — ABNORMAL LOW (ref 0.8–2.0)

## 2024-05-12 LAB — CBG MONITORING, ED: Glucose-Capillary: 395 mg/dL — ABNORMAL HIGH (ref 70–99)

## 2024-05-12 LAB — HCG, SERUM, QUALITATIVE: Preg, Serum: NEGATIVE

## 2024-05-12 LAB — BRAIN NATRIURETIC PEPTIDE: B Natriuretic Peptide: 1432.3 pg/mL — ABNORMAL HIGH (ref 0.0–100.0)

## 2024-05-12 MED ORDER — AZITHROMYCIN 250 MG PO TABS: 500.0000 mg | ORAL_TABLET | Freq: Every day | ORAL | Status: DC

## 2024-05-12 MED ORDER — SACUBITRIL-VALSARTAN 97-103 MG PO TABS
1.0000 | ORAL_TABLET | Freq: Two times a day (BID) | ORAL | Status: DC
  Administered 2024-05-12 – 2024-05-15 (×5): 1 via ORAL
  Filled 2024-05-12 (×6): qty 1

## 2024-05-12 MED ORDER — FUROSEMIDE 10 MG/ML IJ SOLN
80.0000 mg | Freq: Two times a day (BID) | INTRAMUSCULAR | Status: DC
  Administered 2024-05-12 – 2024-05-13 (×3): 80 mg via INTRAVENOUS
  Filled 2024-05-12 (×3): qty 8

## 2024-05-12 MED ORDER — CARVEDILOL 12.5 MG PO TABS: 12.5000 mg | ORAL_TABLET | Freq: Two times a day (BID) | ORAL | Status: DC

## 2024-05-12 MED ORDER — INSULIN GLARGINE 100 UNIT/ML ~~LOC~~ SOLN
35.0000 [IU] | Freq: Every day | SUBCUTANEOUS | Status: DC
  Administered 2024-05-12 – 2024-05-14 (×3): 35 [IU] via SUBCUTANEOUS
  Filled 2024-05-12 (×4): qty 0.35

## 2024-05-12 MED ORDER — FUROSEMIDE 10 MG/ML IJ SOLN: 40.0000 mg | Freq: Once | INTRAMUSCULAR | Status: DC

## 2024-05-12 MED ORDER — INSULIN GLARGINE-YFGN 100 UNIT/ML ~~LOC~~ SOLN
35.0000 [IU] | Freq: Every day | SUBCUTANEOUS | Status: DC
Start: 2024-05-12 — End: 2024-05-12

## 2024-05-12 MED ORDER — INSULIN ASPART 100 UNIT/ML IJ SOLN
10.0000 [IU] | Freq: Once | INTRAMUSCULAR | Status: AC
  Administered 2024-05-12: 10 [IU] via INTRAVENOUS

## 2024-05-12 MED ORDER — ACETAMINOPHEN 500 MG PO TABS
1000.0000 mg | ORAL_TABLET | Freq: Once | ORAL | Status: AC
  Administered 2024-05-12: 1000 mg via ORAL
  Filled 2024-05-12: qty 2

## 2024-05-12 MED ORDER — ATORVASTATIN CALCIUM 40 MG PO TABS
40.0000 mg | ORAL_TABLET | Freq: Every day | ORAL | Status: DC
  Administered 2024-05-12 – 2024-05-16 (×5): 40 mg via ORAL
  Filled 2024-05-12 (×5): qty 1

## 2024-05-12 MED ORDER — ENOXAPARIN SODIUM 40 MG/0.4ML IJ SOSY
40.0000 mg | PREFILLED_SYRINGE | INTRAMUSCULAR | Status: DC
  Administered 2024-05-13 – 2024-05-15 (×3): 40 mg via SUBCUTANEOUS
  Filled 2024-05-12 (×3): qty 0.4

## 2024-05-12 MED ORDER — INSULIN ASPART 100 UNIT/ML IJ SOLN
0.0000 [IU] | Freq: Three times a day (TID) | INTRAMUSCULAR | Status: DC
  Administered 2024-05-12: 8 [IU] via SUBCUTANEOUS
  Administered 2024-05-13: 3 [IU] via SUBCUTANEOUS
  Administered 2024-05-13: 5 [IU] via SUBCUTANEOUS
  Administered 2024-05-13: 3 [IU] via SUBCUTANEOUS
  Administered 2024-05-14: 5 [IU] via SUBCUTANEOUS
  Administered 2024-05-14 – 2024-05-15 (×3): 3 [IU] via SUBCUTANEOUS
  Administered 2024-05-15: 5 [IU] via SUBCUTANEOUS

## 2024-05-12 MED ORDER — SPIRONOLACTONE 25 MG PO TABS
25.0000 mg | ORAL_TABLET | Freq: Every day | ORAL | Status: DC
  Administered 2024-05-12 – 2024-05-16 (×5): 25 mg via ORAL
  Filled 2024-05-12 (×5): qty 1

## 2024-05-12 MED ORDER — SERTRALINE HCL 100 MG PO TABS
100.0000 mg | ORAL_TABLET | Freq: Every day | ORAL | Status: DC
  Administered 2024-05-12 – 2024-05-16 (×5): 100 mg via ORAL
  Filled 2024-05-12 (×5): qty 1

## 2024-05-12 MED ORDER — OXYCODONE HCL 5 MG PO TABS
5.0000 mg | ORAL_TABLET | Freq: Once | ORAL | Status: AC
  Administered 2024-05-12: 5 mg via ORAL
  Filled 2024-05-12: qty 1

## 2024-05-12 MED ORDER — IOHEXOL 350 MG/ML SOLN
75.0000 mL | Freq: Once | INTRAVENOUS | Status: AC | PRN
  Administered 2024-05-12: 75 mL via INTRAVENOUS

## 2024-05-12 MED ORDER — GUAIFENESIN-CODEINE 100-10 MG/5ML PO SOLN
5.0000 mL | Freq: Four times a day (QID) | ORAL | Status: DC | PRN
  Administered 2024-05-14 (×2): 5 mL via ORAL
  Filled 2024-05-12 (×2): qty 5

## 2024-05-12 MED ORDER — SODIUM CHLORIDE 0.9 % IV SOLN
500.0000 mg | Freq: Once | INTRAVENOUS | Status: AC
  Administered 2024-05-12: 500 mg via INTRAVENOUS
  Filled 2024-05-12: qty 5

## 2024-05-12 MED ORDER — BENZONATATE 100 MG PO CAPS
100.0000 mg | ORAL_CAPSULE | Freq: Three times a day (TID) | ORAL | Status: DC
  Administered 2024-05-12 – 2024-05-16 (×11): 100 mg via ORAL
  Filled 2024-05-12 (×11): qty 1

## 2024-05-12 MED ORDER — DIGOXIN 125 MCG PO TABS
0.1250 mg | ORAL_TABLET | Freq: Every day | ORAL | Status: DC
  Administered 2024-05-12 – 2024-05-16 (×5): 0.125 mg via ORAL
  Filled 2024-05-12 (×5): qty 1

## 2024-05-12 MED ORDER — SODIUM CHLORIDE 0.9 % IV SOLN: 1.0000 g | INTRAVENOUS | Status: DC

## 2024-05-12 MED ORDER — SODIUM CHLORIDE 0.9 % IV SOLN
2.0000 g | Freq: Once | INTRAVENOUS | Status: AC
  Administered 2024-05-12: 2 g via INTRAVENOUS
  Filled 2024-05-12: qty 20

## 2024-05-12 MED ORDER — INSULIN GLARGINE-YFGN 100 UNIT/ML ~~LOC~~ SOLN: 35.0000 [IU] | Freq: Every day | SUBCUTANEOUS | Status: DC

## 2024-05-12 MED ORDER — AMLODIPINE BESYLATE 5 MG PO TABS: 10.0000 mg | ORAL_TABLET | Freq: Every day | ORAL | Status: DC

## 2024-05-12 MED ORDER — FUROSEMIDE 10 MG/ML IJ SOLN
40.0000 mg | Freq: Once | INTRAMUSCULAR | Status: AC
  Administered 2024-05-12: 40 mg via INTRAVENOUS
  Filled 2024-05-12: qty 4

## 2024-05-12 MED ORDER — PNEUMOCOCCAL 20-VAL CONJ VACC 0.5 ML IM SUSY
0.5000 mL | PREFILLED_SYRINGE | INTRAMUSCULAR | Status: AC
  Administered 2024-05-14: 0.5 mL via INTRAMUSCULAR
  Filled 2024-05-12: qty 0.5

## 2024-05-12 NOTE — ED Provider Notes (Signed)
 [facility] Provider Note  CSN: [id number] Arrival date & time: 05/12/24 9068  Chief Complaint(s) Cough and Emesis  HPI Cheryl Mercer is a 38 y.o. female history of CHF, pulmonary embolism presenting to the emergency department with cough.  Patient reports 7 days of cough, reports productive cough with some phlegm.  Reports some chest pain when coughing but otherwise no chest pain.  She reports mild dyspnea.  Reports some subjective fevers starting a few days ago as well as some diarrhea.  She reports she has not taken any of her medications with any regularity over the past week because she has been feeling sick, this includes all of her heart failure medications and insulin .  She denies any leg swelling.  No abdominal pain.  She reports some nausea and occasional vomiting.   Past Medical History Past Medical History:  Diagnosis Date   Arthritis    Chronic back pain    Chronic combined systolic and diastolic congestive heart failure (HCC) 09/07/2019   Depression    Diabetes mellitus without complication (HCC)    Fainting spell    History of COVID-19 11/07/2019   Migraines    Peripheral neuropathy    Pregnancy induced hypertension    Pulmonary embolus (HCC) 12/16/2019   Pulmonary infarct (HCC) 12/17/2019   Rhinovirus    Sepsis (HCC) 12/31/2021   Tachycardia    Viral pneumonia    Patient Active Problem List   Diagnosis Date Noted   Acute on chronic heart failure (HCC) 05/12/2024   Anxiety and depression 01/05/2024   Hospital discharge follow-up 01/05/2024   Acute on chronic combined systolic and diastolic CHF, NYHA class 3 (HCC) 12/22/2023   Fainting spell    Demand ischemia (HCC) 02/26/2023   HLD (hyperlipidemia) 02/26/2023   History of chronic CHF EF less than 20% 02/23/2023   CHF exacerbation (HCC) 02/23/2023   AKI (acute kidney injury) 02/23/2023   Essential hypertension 02/23/2023   Diabetic neuropathy (HCC)  06/15/2022   Hypomagnesemia 06/14/2022   Obesity (BMI 30-39.9) 06/13/2022   Hypokalemia 06/12/2022   No pertinent past medical history 12/31/2021   Hypotension 12/31/2021   CHF (congestive heart failure) (HCC) 06/14/2021   Non compliance w medication regimen 06/14/2021   Hyponatremia 06/14/2021   Acute exacerbation of CHF (congestive heart failure) (HCC) 07/10/2020   Apnea    Pulmonary infarct (HCC) 12/17/2019   History of pulmonary embolus (PE) 12/16/2019   CAP (community acquired pneumonia) 12/16/2019   Dilated cardiomyopathy (HCC) 11/10/2019   Elevated troponin 11/07/2019   Multifocal pneumonia 09/07/2019   Acute on chronic systolic CHF (congestive heart failure) (HCC) 09/07/2019   Dyslipidemia 09/07/2019   Back pain 09/07/2019   Atypical pneumonia 09/07/2019   Diabetes mellitus type 2, insulin  dependent (HCC) 03/23/2019   Peripheral edema 03/23/2019   Cardiomegaly 03/23/2019   Proteinuria 03/23/2019   Acute bacterial tonsillitis 05/18/2013    Class: Acute   Lower urinary tract infectious disease 07/30/2012   Home Medication(s) Prior to Admission medications   Medication Sig Start Date End Date Taking? Authorizing Provider  amLODipine  (NORVASC ) 10 MG tablet Take 1 tablet (10 mg total) by mouth daily. 02/17/24   Milford, Harlene CHRISTELLA, FNP  atorvastatin  (LIPITOR) 40 MG tablet Take 1 tablet (40 mg total) by mouth daily. 12/26/23   Lee, [place], NP  Blood Glucose Monitoring Suppl (BLOOD GLUCOSE MONITOR SYSTEM) w/Device KIT Use 3 (three) times daily. 12/03/23   Vincente Shivers, NP  Blood Pressure Monitoring (BLOOD PRESSURE  MONITOR/ARM) DEVI Use to check BP daily. Dx. I10 12/03/23   Vincente Shivers, NP  carvedilol  (COREG ) 12.5 MG tablet Take 1 tablet (12.5 mg total) by mouth 2 (two) times daily. 02/17/24 05/17/24  Glena Harlene HERO, FNP  Continuous Glucose Sensor (FREESTYLE LIBRE 3 SENSOR) MISC 1 each by Other route every 14 (fourteen) days. 01/29/24   Vincente Shivers, NP  digoxin  (LANOXIN ) 0.125 MG  tablet Take 1 tablet (0.125 mg total) by mouth daily. 12/26/23   Lee, [place], NP  Glucose Blood (BLOOD GLUCOSE TEST STRIPS) STRP Use 3 (three) times daily. Use as directed to check blood sugar. 12/03/23   Vincente Shivers, NP  insulin  glargine (LANTUS ) 100 UNIT/ML Solostar Pen Inject 35 Units into the skin daily. 12/25/23   Lee, [place], NP  insulin  lispro (HUMALOG  KWIKPEN) 100 UNIT/ML KwikPen Inject 2-15 Units into the skin 3 (three) times daily after meals. CBG 121 - 150: 2 units, 151 - 200: 3 units, 201 - 250: 5 units, 251 - 300: 8 units, 301 - 350: 11 units, 351 - 400: 15u 12/03/23   Vincente Shivers, NP  Insulin  Pen Needle 31G X 8 MM MISC Use to inject insulin  as directed. 12/03/23   Vincente Shivers, NP  Lancet Device MISC Use 3 (three) times daily. 12/03/23   Vincente Shivers, NP  Lancets MISC Use 3 (three) times daily. Use as directed to check blood sugar. 02/26/23   Rojelio Nest, DO  potassium chloride  SA (KLOR-CON  M) 20 MEQ tablet Take 2 tablets (40 mEq total) by mouth daily. 02/17/24   Milford, Harlene HERO, FNP  sacubitril -valsartan  (ENTRESTO ) 97-103 MG Take 1 tablet by mouth 2 (two) times daily. 12/25/23   Lee, [place], NP  sertraline  (ZOLOFT ) 100 MG tablet Take 1 tablet (100 mg total) by mouth daily. 01/29/24   Vincente Shivers, NP  spironolactone  (ALDACTONE ) 25 MG tablet Take 1 tablet (25 mg total) by mouth daily. 12/26/23   Lee, [place], NP  torsemide  (DEMADEX ) 20 MG tablet Take 4 tablets (80 mg total) by mouth daily. 02/17/24   Glena Harlene HERO, FNP                                                                                                                                    Past Surgical History Past Surgical History:  Procedure Laterality Date   CERVICAL BIOPSY  W/ LOOP ELECTRODE EXCISION  2009   Laparoscopic tubal sterilization with Falope rings.  2009   RIGHT/LEFT HEART CATH AND CORONARY ANGIOGRAPHY N/A 03/25/2019   Procedure: RIGHT/LEFT HEART CATH AND CORONARY ANGIOGRAPHY;  Surgeon: Cherrie Toribio SAUNDERS, MD;   Location: [facility];  Service: Cardiovascular;  Laterality: N/A;   TUBAL LIGATION     Family History Family History  Problem Relation Age of Onset   Stroke Mother    Miscarriages / [place] Mother    Depression Mother    Cancer Mother    Arthritis Mother  Hypertension Mother    Heart attack Mother    Mental illness Mother    Hypertension Father    Hyperlipidemia Father    Heart disease Father    Early death Father    Drug abuse Father    Depression Father    Alcohol abuse Father    Asthma Brother    Hypertension Maternal Grandmother    Heart attack Maternal Grandmother    Early death Maternal Grandmother    Diabetes Maternal Grandmother    COPD Maternal Grandmother    Cancer Maternal Grandmother    Arthritis Maternal Grandmother    Stroke Maternal Grandmother    Hypertension Maternal Grandfather    Hyperlipidemia Maternal Grandfather    Heart disease Maternal Grandfather    Heart attack Maternal Grandfather    Early death Maternal Grandfather    Diabetes Maternal Grandfather    COPD Maternal Grandfather    Arthritis Maternal Grandfather    Stroke Maternal Grandfather    Hypertension Paternal Grandmother    Hyperlipidemia Paternal Grandmother    Early death Paternal Grandmother    COPD Paternal Grandmother    Miscarriages / Stillbirths Paternal Grandmother    Stroke Paternal Grandmother    Kidney disease Paternal Grandfather    Hypertension Paternal Grandfather    Hyperlipidemia Paternal Grandfather    Heart attack Paternal Grandfather    Early death Paternal Grandfather    Anesthesia problems Neg Hx     Social History Social History   Tobacco Use   Smoking status: Former    Types: Cigarettes   Smokeless tobacco: Never  Vaping Use   Vaping status: Some Days  Substance Use Topics   Alcohol use: Yes    Alcohol/week: 6.0 standard drinks of alcohol    Types: 6 Shots of liquor per week   Drug use: Not Currently    Types: Marijuana    Allergies Ibuprofen   Review of Systems Review of Systems  All other systems reviewed and are negative.   Physical Exam Vital Signs  I have reviewed the triage vital signs BP (!) 171/106   Pulse (!) 115   Temp 100.1 F (37.8 C) (Oral)   Resp 16   Ht 4' 11 (1.499 m)   Wt 61.7 kg   SpO2 99%   BMI 27.47 kg/m  Physical Exam Vitals and nursing note reviewed.  Constitutional:      General: She is not in acute distress.    Appearance: She is well-developed.  HENT:     Head: Normocephalic and atraumatic.     Mouth/Throat:     Mouth: Mucous membranes are moist.  Eyes:     Pupils: Pupils are equal, round, and reactive to light.  Neck:     Vascular: Hepatojugular reflux and JVD present.  Cardiovascular:     Rate and Rhythm: Regular rhythm. Tachycardia present.     Heart sounds: No murmur heard. Pulmonary:     Effort: Pulmonary effort is normal. No respiratory distress.     Breath sounds: Examination of the right-lower field reveals rales. Examination of the left-lower field reveals rales. Rales present.  Abdominal:     General: Abdomen is flat.     Palpations: Abdomen is soft.     Tenderness: There is no abdominal tenderness.  Musculoskeletal:        General: No tenderness.     Right lower leg: No edema.     Left lower leg: No edema.  Skin:    General: Skin is warm and dry.  Neurological:     General: No focal deficit present.     Mental Status: She is alert. Mental status is at baseline.  Psychiatric:        Mood and Affect: Mood normal.        Behavior: Behavior normal.     ED Results and Treatments Labs (all labs ordered are listed, but only abnormal results are displayed) Labs Reviewed  COMPREHENSIVE METABOLIC PANEL WITH GFR - Abnormal; Notable for the following components:      Result Value   Sodium 128 (*)    Chloride 91 (*)    Glucose, Bld 554 (*)    Creatinine, Ser 1.04 (*)    Calcium  8.6 (*)    Total Protein 6.3 (*)    Albumin 1.7 (*)    AST  50 (*)    Alkaline Phosphatase 244 (*)    All other components within normal limits  CBC - Abnormal; Notable for the following components:   WBC 14.6 (*)    HCT 35.2 (*)    All other components within normal limits  URINALYSIS, ROUTINE W REFLEX MICROSCOPIC - Abnormal; Notable for the following components:   APPearance HAZY (*)    Glucose, UA >=500 (*)    Hgb urine dipstick MODERATE (*)    Protein, ur >=300 (*)    Bacteria, UA MANY (*)    All other components within normal limits  BRAIN NATRIURETIC PEPTIDE - Abnormal; Notable for the following components:   B Natriuretic Peptide 1,432.3 (*)    All other components within normal limits  CBG MONITORING, ED - Abnormal; Notable for the following components:   Glucose-Capillary 395 (*)    All other components within normal limits  TROPONIN I (HIGH SENSITIVITY) - Abnormal; Notable for the following components:   Troponin I (High Sensitivity) 20 (*)    All other components within normal limits  RESP PANEL BY RT-PCR (RSV, FLU A&B, COVID)  RVPGX2  LIPASE, BLOOD  HCG, SERUM, QUALITATIVE  DIGOXIN  LEVEL  HIV ANTIBODY (ROUTINE TESTING W REFLEX)  CBC  CREATININE, SERUM  PROCALCITONIN                                                                                                                          Radiology DG Chest 2 View Result Date: 05/12/2024 EXAM: 2 VIEW(S) XRAY OF THE CHEST 05/12/2024 10:21:00 AM COMPARISON: 12/22/2023 CLINICAL HISTORY: Productive cough. Reason for exam: productive cough. Per triage notes: Pt complains of productive cough x 7 days, fever 3 days ago. Vomiting started 2 days ago. Diarrhea today. Pt complains of pain in ribs from coughing. FINDINGS: LUNGS AND PLEURA: Bilateral lower lobe airspace opacities are identified. Upper lung zones are clear. No pulmonary edema. No pleural effusion. No pneumothorax. HEART AND MEDIASTINUM: No acute abnormality of the cardiac and mediastinal silhouettes. BONES AND SOFT TISSUES: No  acute osseous abnormality. IMPRESSION: 1. Bilateral lower lobe airspace opacities, which may reflect an infectious or inflammatory process in the  context of productive cough. 2. Upper lung zones are clear. Electronically signed by: Waddell Calk MD 05/12/2024 11:14 AM EDT RP Workstation: [id number]    Pertinent labs & imaging results that were available during my care of the patient were reviewed by me and considered in my medical decision making (see MDM for details).  Medications Ordered in ED Medications  azithromycin  (ZITHROMAX ) 500 mg in sodium chloride  0.9 % 250 mL IVPB (500 mg Intravenous New Bag/Given 05/12/24 1415)  enoxaparin  (LOVENOX ) injection 40 mg (has no administration in time range)  furosemide  (LASIX ) injection 80 mg (has no administration in time range)  insulin  aspart (novoLOG ) injection 10 Units (10 Units Intravenous Given 05/12/24 1316)  cefTRIAXone  (ROCEPHIN ) 2 g in sodium chloride  0.9 % 100 mL IVPB (0 g Intravenous Stopped 05/12/24 1414)  furosemide  (LASIX ) injection 40 mg (40 mg Intravenous Given 05/12/24 1316)  acetaminophen  (TYLENOL ) tablet 1,000 mg (1,000 mg Oral Given 05/12/24 1338)  oxyCODONE  (Oxy IR/ROXICODONE ) immediate release tablet 5 mg (5 mg Oral Given 05/12/24 1338)  iohexol  (OMNIPAQUE ) 350 MG/ML injection 75 mL (75 mLs Intravenous Contrast Given 05/12/24 1427)                                                                                                                                     Procedures .Critical Care  Performed by: Francesca Elsie CROME, MD Authorized by: Francesca Elsie CROME, MD   Critical care provider statement:    Critical care time (minutes):  30   Critical care was necessary to treat or prevent imminent or life-threatening deterioration of the following conditions:  Respiratory failure, cardiac failure and metabolic crisis   Critical care was time spent personally by me on the following activities:  Development of treatment plan with patient  or surrogate, discussions with consultants, evaluation of patient's response to treatment, examination of patient, ordering and review of laboratory studies, ordering and review of radiographic studies, ordering and performing treatments and interventions, pulse oximetry, re-evaluation of patient's condition and review of old charts   Care discussed with: admitting provider     (including critical care time)  Medical Decision Making / ED Course   MDM:  38 year old presenting to the emergency department with cough.  Patient overall well-appearing.  Vitals notable for mild tachycardia.  No fever.  Differential includes acute CHF, pneumonia, pulmonary embolism, pneumothorax, viral illness.  Suspect likely CHF, given JVD.  Performed bedside ultrasound which shows plethoric IVC.  Will give diuresis.  Labs also notable for hyperglycemia.  Patient may also have pneumonia given chest x-ray showing lower lung field infiltrate and fever as well as productive cough, although this could also be due to pulmonary edema.  Cover with antibiotics given fevers and leukocytosis.  Lower concern for pulmonary embolism but patient does have history of this so we will obtain CTA chest which should also help provide further diagnostic clarity.  Discussed with cardiology who will consult.  Patient will need admission.  Not in DKA but will give her some insulin  and monitor closely.  Clinical Course as of 05/12/24 1436  Thu May 12, 2024  1429 Signed out to hospitalist for admission for likely CHF/pneumonia. [WS]    Clinical Course User Index [WS] Francesca Elsie CROME, MD     Additional history obtained: -Additional history obtained from family -External records from outside source obtained and reviewed including: Chart review including previous notes, labs, imaging, consultation notes including prior notes    Lab Tests: -I ordered, reviewed, and interpreted labs.   The pertinent results include:   Labs  Reviewed  COMPREHENSIVE METABOLIC PANEL WITH GFR - Abnormal; Notable for the following components:      Result Value   Sodium 128 (*)    Chloride 91 (*)    Glucose, Bld 554 (*)    Creatinine, Ser 1.04 (*)    Calcium  8.6 (*)    Total Protein 6.3 (*)    Albumin 1.7 (*)    AST 50 (*)    Alkaline Phosphatase 244 (*)    All other components within normal limits  CBC - Abnormal; Notable for the following components:   WBC 14.6 (*)    HCT 35.2 (*)    All other components within normal limits  URINALYSIS, ROUTINE W REFLEX MICROSCOPIC - Abnormal; Notable for the following components:   APPearance HAZY (*)    Glucose, UA >=500 (*)    Hgb urine dipstick MODERATE (*)    Protein, ur >=300 (*)    Bacteria, UA MANY (*)    All other components within normal limits  BRAIN NATRIURETIC PEPTIDE - Abnormal; Notable for the following components:   B Natriuretic Peptide 1,432.3 (*)    All other components within normal limits  CBG MONITORING, ED - Abnormal; Notable for the following components:   Glucose-Capillary 395 (*)    All other components within normal limits  TROPONIN I (HIGH SENSITIVITY) - Abnormal; Notable for the following components:   Troponin I (High Sensitivity) 20 (*)    All other components within normal limits  RESP PANEL BY RT-PCR (RSV, FLU A&B, COVID)  RVPGX2  LIPASE, BLOOD  HCG, SERUM, QUALITATIVE  DIGOXIN  LEVEL  HIV ANTIBODY (ROUTINE TESTING W REFLEX)  CBC  CREATININE, SERUM  PROCALCITONIN    Notable for elevated BNP, hyperglycemia without DKA   EKG   EKG Interpretation Date/Time:  Thursday May 12 2024 10:08:50 EDT Ventricular Rate:  107 PR Interval:  156 QRS Duration:  84 QT Interval:  344 QTC Calculation: 459 R Axis:   -39  Text Interpretation: Sinus tachycardia Left axis deviation Minimal voltage criteria for LVH, may be normal variant ( [employer] product ) Anterior infarct , age undetermined ST & T wave abnormality, consider lateral ischemia Abnormal ECG  Compared to previous tracing No significant change since last tracing Confirmed by Francesca Elsie ([id number]) on 05/12/2024 1:22:12 PM         Imaging Studies ordered: I ordered imaging studies including CXR On my interpretation imaging demonstrates CHF vs pna  I independently visualized and interpreted imaging. I agree with the radiologist interpretation   Medicines ordered and prescription drug management: Meds ordered this encounter  Medications   insulin  aspart (novoLOG ) injection 10 Units   cefTRIAXone  (ROCEPHIN ) 2 g in sodium chloride  0.9 % 100 mL IVPB    Antibiotic Indication::   CAP   azithromycin  (ZITHROMAX ) 500 mg in sodium chloride  0.9 % 250 mL IVPB   furosemide  (LASIX )  injection 40 mg   acetaminophen  (TYLENOL ) tablet 1,000 mg   oxyCODONE  (Oxy IR/ROXICODONE ) immediate release tablet 5 mg    Refill:  0   iohexol  (OMNIPAQUE ) 350 MG/ML injection 75 mL   enoxaparin  (LOVENOX ) injection 40 mg   furosemide  (LASIX ) injection 80 mg    -I have reviewed the patients home medicines and have made adjustments as needed   Consultations Obtained: I requested consultation with the cardiologist, hospitalist,  and discussed lab and imaging findings as well as pertinent plan - they recommend: admission   Cardiac Monitoring: The patient was maintained on a cardiac monitor.  I personally viewed and interpreted the cardiac monitored which showed an underlying rhythm of: Stach   Social Determinants of Health:  Diagnosis or treatment significantly limited by social determinants of health: obesity   Reevaluation: After the interventions noted above, I reevaluated the patient and found that their symptoms have improved  Co morbidities that complicate the patient evaluation  Past Medical History:  Diagnosis Date   Arthritis    Chronic back pain    Chronic combined systolic and diastolic congestive heart failure (HCC) 09/07/2019   Depression    Diabetes mellitus without complication  (HCC)    Fainting spell    History of COVID-19 11/07/2019   Migraines    Peripheral neuropathy    Pregnancy induced hypertension    Pulmonary embolus (HCC) 12/16/2019   Pulmonary infarct (HCC) 12/17/2019   Rhinovirus    Sepsis (HCC) 12/31/2021   Tachycardia    Viral pneumonia       Dispostion: Disposition decision including need for hospitalization was considered, and patient admitted to the hospital.    Final Clinical Impression(s) / ED Diagnoses Final diagnoses:  Acute on chronic combined systolic and diastolic CHF (congestive heart failure) (HCC)  Community acquired bilateral lower lobe pneumonia     This chart was dictated using voice recognition software.  Despite best efforts to proofread,  errors can occur which can change the documentation meaning.    Francesca Elsie CROME, MD 05/12/24 [phone]

## 2024-05-12 NOTE — H&P (Addendum)
 History and Physical    Patient: Cheryl Mercer FMW:[id number] DOB: [birth year] DOA: 05/12/2024 DOS: the patient was seen and examined on 05/12/2024 PCP: Vincente Shivers, NP  Patient coming from: Home  Chief Complaint:  Chief Complaint  Patient presents with   Cough   Emesis   HPI: Cheryl Mercer is a 38 y.o. female with medical history significant of HFrEF (EF <20% on last admit in 12/2023), PE, migraines, hypertension, poorly controlled type 2 diabetes mellitus iso poor medication compliance and ETOH use p/w HFrEF exacerbation and CAP.  The patient reported being sick for about seven days, starting last Wednesday or Thursday. The illness began with a cough, followed by a runny nose. The patient experienced constant coughing, chest pain, and back pain, which became intolerable. Over-the-counter medication, like TheraFlu, was used but did not alleviate symptoms. The patient had difficulty sleeping due to uncontrollable coughing and reported a lack of appetite and energy, to the point of not wanting to get up to use the bathroom. Given the lack of improvement in her sx, the patient presented to the ED. Of note, she was so sick she was unable to take any of her heart failure medications for the past 4 days or so.   In  the ED, pt hypertensive and tachycardic. Labs notable for Na 128, glucose 554, BNP 1432, troponin 20, and WBC 14.6. CT chest c/f multifocal CAP. EDP consulted cardiology and requested medicine admission for acute on chronic heart failure iso CAP.   Review of Systems: As mentioned in the history of present illness. All other systems reviewed and are negative. Past Medical History:  Diagnosis Date   Arthritis    Chronic back pain    Chronic combined systolic and diastolic congestive heart failure (HCC) 09/07/2019   Depression    Diabetes mellitus without complication (HCC)    Fainting spell    History of COVID-19 11/07/2019   Migraines    Peripheral neuropathy     Pregnancy induced hypertension    Pulmonary embolus (HCC) 12/16/2019   Pulmonary infarct (HCC) 12/17/2019   Rhinovirus    Sepsis (HCC) 12/31/2021   Tachycardia    Viral pneumonia    Past Surgical History:  Procedure Laterality Date   CERVICAL BIOPSY  W/ LOOP ELECTRODE EXCISION  2009   Laparoscopic tubal sterilization with Falope rings.  2009   RIGHT/LEFT HEART CATH AND CORONARY ANGIOGRAPHY N/A 03/25/2019   Procedure: RIGHT/LEFT HEART CATH AND CORONARY ANGIOGRAPHY;  Surgeon: Cherrie Toribio SAUNDERS, MD;  Location: [facility];  Service: Cardiovascular;  Laterality: N/A;   TUBAL LIGATION     Social History:  reports that she has quit smoking. Her smoking use included cigarettes. She has never used smokeless tobacco. She reports current alcohol use of about 6.0 standard drinks of alcohol per week. She reports that she does not currently use drugs after having used the following drugs: Marijuana.  Allergies  Allergen Reactions   Ibuprofen  Anaphylaxis    Family History  Problem Relation Age of Onset   Stroke Mother    Miscarriages / [place] Mother    Depression Mother    Cancer Mother    Arthritis Mother    Hypertension Mother    Heart attack Mother    Mental illness Mother    Hypertension Father    Hyperlipidemia Father    Heart disease Father    Early death Father    Drug abuse Father    Depression Father    Alcohol abuse Father  Asthma Brother    Hypertension Maternal Grandmother    Heart attack Maternal Grandmother    Early death Maternal Grandmother    Diabetes Maternal Grandmother    COPD Maternal Grandmother    Cancer Maternal Grandmother    Arthritis Maternal Grandmother    Stroke Maternal Grandmother    Hypertension Maternal Grandfather    Hyperlipidemia Maternal Grandfather    Heart disease Maternal Grandfather    Heart attack Maternal Grandfather    Early death Maternal Grandfather    Diabetes Maternal Grandfather    COPD Maternal Grandfather     Arthritis Maternal Grandfather    Stroke Maternal Grandfather    Hypertension Paternal Grandmother    Hyperlipidemia Paternal Grandmother    Early death Paternal Grandmother    COPD Paternal Grandmother    Miscarriages / Stillbirths Paternal Grandmother    Stroke Paternal Grandmother    Kidney disease Paternal Grandfather    Hypertension Paternal Grandfather    Hyperlipidemia Paternal Grandfather    Heart attack Paternal Grandfather    Early death Paternal Grandfather    Anesthesia problems Neg Hx     Prior to Admission medications   Medication Sig Start Date End Date Taking? Authorizing Provider  amLODipine  (NORVASC ) 10 MG tablet Take 1 tablet (10 mg total) by mouth daily. 02/17/24   Milford, Harlene HERO, FNP  atorvastatin  (LIPITOR) 40 MG tablet Take 1 tablet (40 mg total) by mouth daily. 12/26/23   Lee, [place], NP  Blood Glucose Monitoring Suppl (BLOOD GLUCOSE MONITOR SYSTEM) w/Device KIT Use 3 (three) times daily. 12/03/23   Vincente Shivers, NP  Blood Pressure Monitoring (BLOOD PRESSURE MONITOR/ARM) DEVI Use to check BP daily. Dx. I10 12/03/23   Vincente Shivers, NP  carvedilol  (COREG ) 12.5 MG tablet Take 1 tablet (12.5 mg total) by mouth 2 (two) times daily. 02/17/24 05/17/24  Glena Harlene HERO, FNP  Continuous Glucose Sensor (FREESTYLE LIBRE 3 SENSOR) MISC 1 each by Other route every 14 (fourteen) days. 01/29/24   Vincente Shivers, NP  digoxin  (LANOXIN ) 0.125 MG tablet Take 1 tablet (0.125 mg total) by mouth daily. 12/26/23   Lee, [place], NP  Glucose Blood (BLOOD GLUCOSE TEST STRIPS) STRP Use 3 (three) times daily. Use as directed to check blood sugar. 12/03/23   Vincente Shivers, NP  insulin  glargine (LANTUS ) 100 UNIT/ML Solostar Pen Inject 35 Units into the skin daily. 12/25/23   Lee, [place], NP  insulin  lispro (HUMALOG  KWIKPEN) 100 UNIT/ML KwikPen Inject 2-15 Units into the skin 3 (three) times daily after meals. CBG 121 - 150: 2 units, 151 - 200: 3 units, 201 - 250: 5 units, 251 - 300: 8 units, 301 - 350:  11 units, 351 - 400: 15u 12/03/23   Vincente Shivers, NP  Insulin  Pen Needle 31G X 8 MM MISC Use to inject insulin  as directed. 12/03/23   Vincente Shivers, NP  Lancet Device MISC Use 3 (three) times daily. 12/03/23   Vincente Shivers, NP  Lancets MISC Use 3 (three) times daily. Use as directed to check blood sugar. 02/26/23   Rojelio Nest, DO  potassium chloride  SA (KLOR-CON  M) 20 MEQ tablet Take 2 tablets (40 mEq total) by mouth daily. 02/17/24   Milford, Harlene HERO, FNP  sacubitril -valsartan  (ENTRESTO ) 97-103 MG Take 1 tablet by mouth 2 (two) times daily. 12/25/23   Lee, [place], NP  sertraline  (ZOLOFT ) 100 MG tablet Take 1 tablet (100 mg total) by mouth daily. 01/29/24   Vincente Shivers, NP  spironolactone  (ALDACTONE ) 25 MG tablet Take 1 tablet (  25 mg total) by mouth daily. 12/26/23   Lee, [place], NP  torsemide  (DEMADEX ) 20 MG tablet Take 4 tablets (80 mg total) by mouth daily. 02/17/24   Glena Harlene HERO, FNP    Physical Exam: Vitals:   05/12/24 9052 05/12/24 0949 05/12/24 1341 05/12/24 1415  BP: (!) 171/106     Pulse: (!) 110   (!) 115  Resp:  20  16  Temp:  98.5 F (36.9 C) 100.1 F (37.8 C)   TempSrc:  Oral Oral   SpO2: 100%   99%  Weight:      Height:       General: Alert, oriented x3, resting comfortably in no acute distress Respiratory: Bibasilar rhonchi; no wheezing Cardiovascular: Regular rate and rhythm w/o m/r/g   Data Reviewed:  Lab Results  Component Value Date   WBC 14.6 (H) 05/12/2024   HGB 12.1 05/12/2024   HCT 35.2 (L) 05/12/2024   MCV 89.1 05/12/2024   PLT 324 05/12/2024   Lab Results  Component Value Date   GLUCOSE 554 (HH) 05/12/2024   CALCIUM  8.6 (L) 05/12/2024   NA 128 (L) 05/12/2024   K 4.5 05/12/2024   CO2 26 05/12/2024   CL 91 (L) 05/12/2024   BUN 12 05/12/2024   CREATININE 1.04 (H) 05/12/2024   Lab Results  Component Value Date   ALT 20 05/12/2024   AST 50 (H) 05/12/2024   ALKPHOS 244 (H) 05/12/2024   BILITOT 0.8 05/12/2024   Lab Results  Component  Value Date   INR 1.0 01/06/2022   INR 1.4 (H) 12/31/2021   INR 1.3 (H) 12/31/2021   Radiology: CT Angio Chest PE W/Cm &/Or Wo Cm Result Date: 05/12/2024 CLINICAL DATA:  Pulmonary embolus suspected with high probability. Cough and vomiting. EXAM: CT ANGIOGRAPHY CHEST WITH CONTRAST TECHNIQUE: Multidetector CT imaging of the chest was performed using the standard protocol during bolus administration of intravenous contrast. Multiplanar CT image reconstructions and MIPs were obtained to evaluate the vascular anatomy. RADIATION DOSE REDUCTION: This exam was performed according to the departmental dose-optimization program which includes automated exposure control, adjustment of the mA and/or kV according to patient size and/or use of iterative reconstruction technique. CONTRAST:  75mL OMNIPAQUE  IOHEXOL  350 MG/ML SOLN COMPARISON:  Chest radiograph 05/12/2024.  CT chest 12/31/2021 FINDINGS: Cardiovascular: There is good opacification of the central and segmental pulmonary arteries. No focal filling defects are demonstrated. No evidence of significant pulmonary embolus. Mild cardiac enlargement. No pericardial effusions. Normal caliber thoracic aorta. Mediastinum/Nodes: Thyroid  gland is unremarkable. Esophagus is decompressed. Mild prominence of mediastinal lymph nodes without significant change since prior study. No pathologic enlargement. This is likely reactive. Lungs/Pleura: Motion artifact limits examination. There is diffuse nodular airspace infiltration demonstrated throughout both lower lungs, the right middle lung, and with patchy involvement of the upper lungs. This is likely multifocal pneumonia or aspiration. No pleural effusion or pneumothorax. Upper Abdomen: No acute abnormality. Musculoskeletal: No chest wall abnormality. No acute or significant osseous findings. Review of the MIP images confirms the above findings. IMPRESSION: 1. No evidence of significant pulmonary embolus. 2. Mild cardiac  enlargement. 3. Diffuse airspace infiltrates in the lungs most prominent in the bases. This is likely multifocal pneumonia or aspiration. Electronically Signed   By: Elsie Gravely M.D.   On: 05/12/2024 15:04   DG Chest 2 View Result Date: 05/12/2024 EXAM: 2 VIEW(S) XRAY OF THE CHEST 05/12/2024 10:21:00 AM COMPARISON: 12/22/2023 CLINICAL HISTORY: Productive cough. Reason for exam: productive cough. Per triage notes: Pt  complains of productive cough x 7 days, fever 3 days ago. Vomiting started 2 days ago. Diarrhea today. Pt complains of pain in ribs from coughing. FINDINGS: LUNGS AND PLEURA: Bilateral lower lobe airspace opacities are identified. Upper lung zones are clear. No pulmonary edema. No pleural effusion. No pneumothorax. HEART AND MEDIASTINUM: No acute abnormality of the cardiac and mediastinal silhouettes. BONES AND SOFT TISSUES: No acute osseous abnormality. IMPRESSION: 1. Bilateral lower lobe airspace opacities, which may reflect an infectious or inflammatory process in the context of productive cough. 2. Upper lung zones are clear. Electronically signed by: Waddell Calk MD 05/12/2024 11:14 AM EDT RP Workstation: [id number]    Assessment and Plan: 36F h/o HFrEF (EF <20% on last admit in 12/2023), PE, migraines, hypertension, poorly controlled type 2 diabetes mellitus iso poor medication compliance and ETOH use p/w HFrEF exacerbation.  HFrEF exacerbation -Cards consulted; apprec eval recs -IV lasix  80mg  BID for now per Cards; goal net neg 1-2L/d; strict I/Os; daily standing weights; K>4/Mg>2 -PTA atorvastatin , digoxin , Entresto , and spironolactone  -HOLD pta Coreg  12.5mg  BID  CAP Cough -IV CTX 1g daily to complete 5 day CAP course -PO azithromycin  500mg  daily to complete 3 day CAP course -Duonebs prn -Wean O2 as tolerated -Ambulatory pulse ox prior to d/c -F/u procalcitoning -F/u RVP; if positive consider d/c abx and diuresing; assuming procalcitonin is wnl -Tessalon  perles TID  and guaifenesin -codeine  prn  DM2 -Semglee  35U nightly and SSI TID AC prn  Mood disorder -PTA Zoloft    Advance Care Planning:   Code Status: Full Code   Consults: Cards  Family Communication: Spouse  Severity of Illness: The appropriate patient status for this patient is INPATIENT. Inpatient status is judged to be reasonable and necessary in order to provide the required intensity of service to ensure the patient's safety. The patient's presenting symptoms, physical exam findings, and initial radiographic and laboratory data in the context of their chronic comorbidities is felt to place them at high risk for further clinical deterioration. Furthermore, it is not anticipated that the patient will be medically stable for discharge from the hospital within 2 midnights of admission.   * I certify that at the point of admission it is my clinical judgment that the patient will require inpatient hospital care spanning beyond 2 midnights from the point of admission due to high intensity of service, high risk for further deterioration and high frequency of surveillance required.*   ------- I spent 56 minutes reviewing previous notes, at the bedside counseling/discussing the treatment plan, and performing clinical documentation.  Author: Marsha Ada, MD 05/12/2024 2:29 PM  For on call review [email or web address].

## 2024-05-12 NOTE — Consult Note (Addendum)
 Advanced Heart Failure Team Consult Note   Primary Physician: Vincente Shivers, NP Cardiologist:  Dr. Cherrie   Reason for Consultation: acute on chronic systolic heart failure   HPI:    Cheryl Mercer is seen today for evaluation of acute on chronic systolic heart failure at the request of Dr. Mai, Emergency Medicine.   Cheryl Mercer is a 38 y.o.  hispanic female with chronic systolic heart failure, h/o PE, migraines, hypertension, and poorly controlled T2DM. She has previously no showed to multiple AHF clinic visits. Has been admitted several time with CHF exacerbations 2/2 med noncompliance.    Admitted 7/24 with a/c HF. Echo showed EF < 20%. RV severely reduced. Mild concentric LVH, LV with GHK, smoke seen in LA, RA severely dilated, mild MR. Felt to be 2/2 to ETOH, HTN and medication noncompliance. GDMT titrated and she was discharged home.    Direct admit from AHF Clinic 12/21/23 for hyperglycemia and CHF exacerbation 2/2 medication noncompliance. Diuresed and resumed on GDMT.  Now presents to the ED w/ cough and emesis. Also w/ mild dyspnea, subjective fevers, body aches and diarrhea. Diarrhea started today. Reports she has not been taking any of her medications for the past wk, including her insulin , due to feeling sick.  Initial w/u in the ED, hypertensive BP 171/106, tachycardic w/ HF 110, EKG sinus tach 107 bpm, RR 20. O2 sats normal on RA.   Labs w/ severe hyperglycemia 554, AGAP 11, Corrected Na 135, CO2 26, Scr 1.04, BUN 26, K 4.5. AST 50. WBC elevated 14.6   CXR b/l lower lobe airspace opacities. Respiratory viral panel negative. Chest CT ordered. She has been started on empiric abx for CAP.   Also felt to have a/c CHF. Bedside POCU shows plethoric IVC. BNP 1400.  TRH has been asked to admit. AHF team consulted for heart failure management.     Home Medications Prior to Admission medications   Medication Sig Start Date End Date Taking? Authorizing  Provider  amLODipine  (NORVASC ) 10 MG tablet Take 1 tablet (10 mg total) by mouth daily. 02/17/24   Milford, Harlene CHRISTELLA, FNP  atorvastatin  (LIPITOR) 40 MG tablet Take 1 tablet (40 mg total) by mouth daily. 12/26/23   Lee, [place], NP  Blood Glucose Monitoring Suppl (BLOOD GLUCOSE MONITOR SYSTEM) w/Device KIT Use 3 (three) times daily. 12/03/23   Vincente Shivers, NP  Blood Pressure Monitoring (BLOOD PRESSURE MONITOR/ARM) DEVI Use to check BP daily. Dx. I10 12/03/23   Vincente Shivers, NP  carvedilol  (COREG ) 12.5 MG tablet Take 1 tablet (12.5 mg total) by mouth 2 (two) times daily. 02/17/24 05/17/24  Glena Harlene CHRISTELLA, FNP  Continuous Glucose Sensor (FREESTYLE LIBRE 3 SENSOR) MISC 1 each by Other route every 14 (fourteen) days. 01/29/24   Vincente Shivers, NP  digoxin  (LANOXIN ) 0.125 MG tablet Take 1 tablet (0.125 mg total) by mouth daily. 12/26/23   Lee, [place], NP  Glucose Blood (BLOOD GLUCOSE TEST STRIPS) STRP Use 3 (three) times daily. Use as directed to check blood sugar. 12/03/23   Vincente Shivers, NP  insulin  glargine (LANTUS ) 100 UNIT/ML Solostar Pen Inject 35 Units into the skin daily. 12/25/23   Lee, [place], NP  insulin  lispro (HUMALOG  KWIKPEN) 100 UNIT/ML KwikPen Inject 2-15 Units into the skin 3 (three) times daily after meals. CBG 121 - 150: 2 units, 151 - 200: 3 units, 201 - 250: 5 units, 251 - 300: 8 units, 301 - 350: 11 units, 351 - 400: 15u 12/03/23   Vincente,  Bable[place], NP  Insulin  Pen Needle 31G X 8 MM MISC Use to inject insulin  as directed. 12/03/23   Vincente Shivers, NP  Lancet Device MISC Use 3 (three) times daily. 12/03/23   Vincente Shivers, NP  Lancets MISC Use 3 (three) times daily. Use as directed to check blood sugar. 02/26/23   Rojelio Nest, DO  potassium chloride  SA (KLOR-CON  M) 20 MEQ tablet Take 2 tablets (40 mEq total) by mouth daily. 02/17/24   Milford, Harlene HERO, FNP  sacubitril -valsartan  (ENTRESTO ) 97-103 MG Take 1 tablet by mouth 2 (two) times daily. 12/25/23   Lee, [place], NP  sertraline  (ZOLOFT ) 100 MG  tablet Take 1 tablet (100 mg total) by mouth daily. 01/29/24   Vincente Shivers, NP  spironolactone  (ALDACTONE ) 25 MG tablet Take 1 tablet (25 mg total) by mouth daily. 12/26/23   Lee, [place], NP  torsemide  (DEMADEX ) 20 MG tablet Take 4 tablets (80 mg total) by mouth daily. 02/17/24   Glena Harlene HERO, FNP    Past Medical History: Past Medical History:  Diagnosis Date   Arthritis    Chronic back pain    Chronic combined systolic and diastolic congestive heart failure (HCC) 09/07/2019   Depression    Diabetes mellitus without complication (HCC)    Fainting spell    History of COVID-19 11/07/2019   Migraines    Peripheral neuropathy    Pregnancy induced hypertension    Pulmonary embolus (HCC) 12/16/2019   Pulmonary infarct (HCC) 12/17/2019   Rhinovirus    Sepsis (HCC) 12/31/2021   Tachycardia    Viral pneumonia     Past Surgical History: Past Surgical History:  Procedure Laterality Date   CERVICAL BIOPSY  W/ LOOP ELECTRODE EXCISION  2009   Laparoscopic tubal sterilization with Falope rings.  2009   RIGHT/LEFT HEART CATH AND CORONARY ANGIOGRAPHY N/A 03/25/2019   Procedure: RIGHT/LEFT HEART CATH AND CORONARY ANGIOGRAPHY;  Surgeon: Cherrie Toribio SAUNDERS, MD;  Location: [facility];  Service: Cardiovascular;  Laterality: N/A;   TUBAL LIGATION      Family History: Family History  Problem Relation Age of Onset   Stroke Mother    Miscarriages / [place] Mother    Depression Mother    Cancer Mother    Arthritis Mother    Hypertension Mother    Heart attack Mother    Mental illness Mother    Hypertension Father    Hyperlipidemia Father    Heart disease Father    Early death Father    Drug abuse Father    Depression Father    Alcohol abuse Father    Asthma Brother    Hypertension Maternal Grandmother    Heart attack Maternal Grandmother    Early death Maternal Grandmother    Diabetes Maternal Grandmother    COPD Maternal Grandmother    Cancer Maternal Grandmother     Arthritis Maternal Grandmother    Stroke Maternal Grandmother    Hypertension Maternal Grandfather    Hyperlipidemia Maternal Grandfather    Heart disease Maternal Grandfather    Heart attack Maternal Grandfather    Early death Maternal Grandfather    Diabetes Maternal Grandfather    COPD Maternal Grandfather    Arthritis Maternal Grandfather    Stroke Maternal Grandfather    Hypertension Paternal Grandmother    Hyperlipidemia Paternal Grandmother    Early death Paternal Grandmother    COPD Paternal Grandmother    Miscarriages / Stillbirths Paternal Grandmother    Stroke Paternal Grandmother    Kidney disease  Paternal Grandfather    Hypertension Paternal Grandfather    Hyperlipidemia Paternal Grandfather    Heart attack Paternal Grandfather    Early death Paternal Grandfather    Anesthesia problems Neg Hx     Social History: Social History   Socioeconomic History   Marital status: Married    Spouse name: Not on file   Number of children: 4   Years of education: Not on file   Highest education level: High school graduate  Occupational History   Not on file  Tobacco Use   Smoking status: Former    Types: Cigarettes   Smokeless tobacco: Never  Vaping Use   Vaping status: Some Days  Substance and Sexual Activity   Alcohol use: Yes    Alcohol/week: 6.0 standard drinks of alcohol    Types: 6 Shots of liquor per week   Drug use: Not Currently    Types: Marijuana   Sexual activity: Yes    Birth control/protection: Surgical    Comment: band placed around tubes in 2009 surgically by Dr. Edsel  Other Topics Concern   Not on file  Social History Narrative   Not on file   Social Drivers of Health   Financial Resource Strain: High Risk (12/21/2023)   Overall Financial Resource Strain (CARDIA)    Difficulty of Paying Living Expenses: Hard  Food Insecurity: Food Insecurity Present (12/28/2023)   Hunger Vital Sign    Worried About Running Out of Food in the Last Year:  Sometimes true    Ran Out of Food in the Last Year: Sometimes true  Transportation Needs: No Transportation Needs (12/28/2023)   PRAPARE - Administrator, Civil Service (Medical): No    Lack of Transportation (Non-Medical): No  Physical Activity: Insufficiently Active (12/21/2023)   Exercise Vital Sign    Days of Exercise per Week: 7 days    Minutes of Exercise per Session: 20 min  Stress: No Stress Concern Present (12/21/2023)   [employer] of Occupational Health - Occupational Stress Questionnaire    Feeling of Stress : Not at all  Social Connections: Unknown (12/21/2023)   Social Connection and Isolation Panel    Frequency of Communication with Friends and Family: More than three times a week    Frequency of Social Gatherings with Friends and Family: More than three times a week    Attends Religious Services: More than 4 times per year    Active Member of [employer] or Organizations: Yes    Attends Banker Meetings: More than 4 times per year    Marital Status: Patient declined    Allergies:  Allergies  Allergen Reactions   Ibuprofen  Anaphylaxis    Objective:    Vital Signs:   Temp:  [98.5 F (36.9 C)] 98.5 F (36.9 C) (09/25 0949) Pulse Rate:  [110] 110 (09/25 0947) Resp:  [20] 20 (09/25 0949) BP: (171)/(106) 171/106 (09/25 0947) SpO2:  [100 %] 100 % (09/25 0947) Weight:  [61.7 kg] 61.7 kg (09/25 0946)    Weight change: Filed Weights   05/12/24 0946  Weight: 61.7 kg    Intake/Output:  No intake or output data in the 24 hours ending 05/12/24 1332    Physical Exam    General:  fatigue appearing, coughing frequently. No increased WOB  Neck: JVD 12-14 cm  Cor: regular rhythm, tachy rate  Lungs: diminished BS at the bases w/ diffuse expiratory wheezing b/l  Abdomen: soft, non distended, tender LUQ  Extremities: no cyanosis, clubbing,  rash, edema Neuro: A&Ox3. Moves all 4 ext w/o difficulty   Telemetry   Sinus tach 120s, personally  reviewed   EKG    Sinus tach 107 bpm, personally reviewed   Labs   Basic Metabolic Panel: Recent Labs  Lab 05/12/24 1015  NA 128*  K 4.5  CL 91*  CO2 26  GLUCOSE 554*  BUN 12  CREATININE 1.04*  CALCIUM  8.6*    Liver Function Tests: Recent Labs  Lab 05/12/24 1015  AST 50*  ALT 20  ALKPHOS 244*  BILITOT 0.8  PROT 6.3*  ALBUMIN 1.7*   Recent Labs  Lab 05/12/24 1015  LIPASE 41   No results for input(s): AMMONIA in the last 168 hours.  CBC: Recent Labs  Lab 05/12/24 1015  WBC 14.6*  HGB 12.1  HCT 35.2*  MCV 89.1  PLT 324    Cardiac Enzymes: No results for input(s): CKTOTAL, CKMB, CKMBINDEX, TROPONINI in the last 168 hours.  BNP: BNP (last 3 results) Recent Labs    12/22/23 1455 01/13/24 1019 02/17/24 1028  BNP 1,451.1* 317.4* 821.4*    ProBNP (last 3 results) Recent Labs    12/03/23 1235  PROBNP 1,145.0*     CBG: Recent Labs  Lab 05/12/24 1307  GLUCAP 395*    Coagulation Studies: No results for input(s): LABPROT, INR in the last 72 hours.   Imaging   DG Chest 2 View Result Date: 05/12/2024 EXAM: 2 VIEW(S) XRAY OF THE CHEST 05/12/2024 10:21:00 AM COMPARISON: 12/22/2023 CLINICAL HISTORY: Productive cough. Reason for exam: productive cough. Per triage notes: Pt complains of productive cough x 7 days, fever 3 days ago. Vomiting started 2 days ago. Diarrhea today. Pt complains of pain in ribs from coughing. FINDINGS: LUNGS AND PLEURA: Bilateral lower lobe airspace opacities are identified. Upper lung zones are clear. No pulmonary edema. No pleural effusion. No pneumothorax. HEART AND MEDIASTINUM: No acute abnormality of the cardiac and mediastinal silhouettes. BONES AND SOFT TISSUES: No acute osseous abnormality. IMPRESSION: 1. Bilateral lower lobe airspace opacities, which may reflect an infectious or inflammatory process in the context of productive cough. 2. Upper lung zones are clear. Electronically signed by: Waddell Calk  MD 05/12/2024 11:14 AM EDT RP Workstation: [id number]     Medications:     Current Medications:   Infusions:  azithromycin  (ZITHROMAX ) 500 mg in sodium chloride  0.9 % 250 mL IVPB     cefTRIAXone  (ROCEPHIN )  IV 2 g (05/12/24 1321)      Patient Profile   38 y/o female w/ chronic systolic heart failure, h/o PE, migraines, hypertension, poorly controlled T2DM and poor med compliance presenting w/ 1 wk h/o flu like symptoms. Off all meds x 1 wk d/t feeling poorly. Diagnosed w/ a/c CHF, hyperglycemia w/ initial glucose >550 and possible CAP.   Assessment/Plan   1. Acute on Chronic Systolic Heart Failure - Echo (10/23): EF <20%, RV severely reduced. Mild concentric LVH, LV with GHK, smoke seen in LA, RA severely dilated, mild MR - Echo (7/24): EF < 20%, RV moderately reduced  - Echo (5/25): EF <20%, RV nl  - Suspect CM 2/2 uncontrolled hypertension, ETOH, medication noncompliance and DM - Admitted w/ volume overload and NYHA IIIb symptoms. Off all HF GDMT + diuretics x 1 wk. POCU in ED w/ plethoric IVC. BNP 1400. CXR b/l lower lobe airspace opacities - diurese w/ IV Lasix  80 mg bid  - needs afterload reduction restart on HF GDMT  - restart Entresto  49-51 mg bid - restart  Spironolactone  25 mg daily  - hold Coreg  given ADHF w/ volume overload - restart digoxin  0.125 mg daily   - No SGLT2i with uncontrolled T2DM, A1c 12.9   2. Respiratory - viral respiratory panel negative  - CT ordered to r/o PE - on empiric abx for suspected CAP given symptoms + presence of b/l lower lobe airspace opacities on CXR though may be representative of CHF. Will check PCT. If negative, can consider stopping abx   3. Uncontrolled Type 2DM w/ Hyperglycemia - noncompliant w/ insulin   - initial Gluc 554, repeat POC Gluc 395 after dose of insulin  - doubt DKA  - restart insulin  regimen per IM  - get back on statin   4. Hypertension  - uncontrolled, d/t noncompliance - restart HF GDMT per above - after  HF meds restarted, add back home amlodipine  if still uncontrolled   5. H/o PE w/ pulmonary infarct - treated with eliquis  x 6 months 4/21 - given presentation, plan chest CT to r/o recurrence     Length of Stay: 0  Brittainy Simmons, PA-C  05/12/2024, 1:32 PM    Advanced Heart Failure Team Pager [phone] (M-F; 7a - 5p)  Please contact [facility] for night-coverage after hours (4p -7a ) and weekends on [email or web address]  Patient seen with PA, I formulated the plan and agree with the above note.   History as noted above.  Long-standing nonischemic cardiomyopathy, spotty compliance with medication regimen.    She has been off her medications for about a week.  Admitted with cough + dyspnea.  CTA chest showed no PE but there was bibasilar airspace disease concerning for PNA versus aspiration.  BP markedly elevated.   General: NAD Neck: JVP 16 cm, no thyromegaly or thyroid  nodule.  Lungs: Bibasilar crackles.  CV: Lateral PMI.  Heart regular S1/S2, +S3, no murmur.  Trace ankle edema. No carotid bruit.  Normal pedal pulses.  Abdomen: Soft, nontender, no hepatosplenomegaly, no distention.  Skin: Intact without lesions or rashes.  Neurologic: Alert and oriented x 3.  Psych: Normal affect. Extremities: No clubbing or cyanosis.  HEENT: Normal.   She is afebrile but based on CXR and CTA chest, she may have community-acquired PNA.  Agree with checking procalcitonin and treating with abx as is being done (ceftriaxone /azithromycin ).   Acute on chronic systolic CHF.  She is volume overloaded on exam with S3.  As above, has been out of meds x 1 week.  Hypertensive urgency.  - Lasix  80 mg IV bid.  - Restart digoxin  0.125 - Restart Entresto  97/103 bid.  - Restart spironolactone  25 mg daily.  - Hold Coreg  while significantly volume overloaded (has been off for a week).  - If BP remains elevated after the above meds restarted, can restart home amlodipine .   Ezra Shuck 05/12/2024 3:38 PM

## 2024-05-12 NOTE — Inpatient Diabetes Management (Signed)
 Inpatient Diabetes Program Recommendations  AACE/ADA: New Consensus Statement on Inpatient Glycemic Control (2015)  Target Ranges:  Prepandial:   less than 140 mg/dL      Peak postprandial:   less than 180 mg/dL (1-2 hours)      Critically ill patients:  140 - 180 mg/dL   Lab Results  Component Value Date   GLUCAP 395 (H) 05/12/2024   HGBA1C 12.9 (H) 12/03/2023    Review of Glycemic Control  Diabetes history: DM 2 Outpatient Diabetes medications: Lantus  35 units Daily, Humalog  2-15 units tid Current orders for Inpatient glycemic control:  None in ED  Inpatient Diabetes Program Recommendations:    -   Consider Lantus  20 units -   Novolog  0-15 units tid + hs  Thanks,  Clotilda Bull RN, MSN, BC-ADM Inpatient Diabetes Coordinator Team Pager [phone] (8a-5p)

## 2024-05-12 NOTE — ED Notes (Signed)
 CCMD called and verified patient on cardiac telemetry

## 2024-05-12 NOTE — Plan of Care (Signed)
  Problem: Education: Goal: Knowledge of General Education information will improve Description: Including pain rating scale, medication(s)/side effects and non-pharmacologic comfort measures Outcome: Progressing   Problem: Clinical Measurements: Goal: Ability to maintain clinical measurements within normal limits will improve Outcome: Progressing   Problem: Clinical Measurements: Goal: Will remain free from infection Outcome: Progressing   Problem: Clinical Measurements: Goal: Diagnostic test results will improve Outcome: Progressing   Problem: Nutrition: Goal: Adequate nutrition will be maintained Outcome: Progressing   Problem: Activity: Goal: Risk for activity intolerance will decrease Outcome: Progressing   Problem: Pain Managment: Goal: General experience of comfort will improve and/or be controlled Outcome: Progressing   Problem: Education: Goal: Ability to describe self-care measures that may prevent or decrease complications (Diabetes Survival Skills Education) will improve Outcome: Progressing   Problem: Skin Integrity: Goal: Risk for impaired skin integrity will decrease Outcome: Progressing   Problem: Fluid Volume: Goal: Ability to maintain a balanced intake and output will improve Outcome: Progressing

## 2024-05-12 NOTE — ED Notes (Signed)
 This RN called to inform pt she has and ED room, pt states she stepped out to get food and will be back in 15 mins. Will hold ED treatment room for her

## 2024-05-12 NOTE — ED Notes (Signed)
Pt walked to the bathroom. 

## 2024-05-12 NOTE — ED Notes (Signed)
 Pt made aware that MD states to wait until Ct comes back before she can eat

## 2024-05-12 NOTE — ED Notes (Signed)
 Pt back in ed

## 2024-05-12 NOTE — ED Notes (Signed)
 Pt daughter came to the nursing station asking about patients CP. Pt daughter made aware that MD notified of patient CP.

## 2024-05-12 NOTE — ED Provider Triage Note (Signed)
 Emergency Medicine Provider Triage Evaluation Note  Cheryl Mercer , a 38 y.o. female  was evaluated in triage.  Pt complains of cough, chest pain with cough.  Review of Systems  Positive: Cough, chest pain, vomiting, weakness Negative: dyspnea  Physical Exam  BP (!) 171/106   Pulse (!) 110   Temp 98.5 F (36.9 C) (Oral)   Resp 20   Ht 4' 11 (1.499 m)   Wt 61.7 kg   SpO2 100%   BMI 27.47 kg/m  Gen:   Awake, no distress   Resp:  Normal effort  MSK:   Moves extremities without difficulty  Other:  coughing  Medical Decision Making  Medically screening exam initiated at 10:42 AM.  Appropriate orders placed.  Cheryl Mercer was informed that the remainder of the evaluation will be completed by another provider, this initial triage assessment does not replace that evaluation, and the importance of remaining in the ED until their evaluation is complete.     Cheryl Elsie CROME, MD 05/12/24 [phone]

## 2024-05-12 NOTE — ED Triage Notes (Signed)
 Pt complains of productive cough x 7 days, fever 3 days ago. Vomiting started 2 days ago. Diarrhea today. Pt complains of pain in ribs from coughing.

## 2024-05-12 NOTE — ED Notes (Signed)
 Pt seen being wheeled out by family into vehicle and leaving the ED.

## 2024-05-13 ENCOUNTER — Other Ambulatory Visit (HOSPITAL_COMMUNITY): Payer: Self-pay

## 2024-05-13 DIAGNOSIS — I5023 Acute on chronic systolic (congestive) heart failure: Secondary | ICD-10-CM | POA: Diagnosis not present

## 2024-05-13 LAB — BASIC METABOLIC PANEL WITH GFR
Anion gap: 12 (ref 5–15)
BUN: 15 mg/dL (ref 6–20)
CO2: 33 mmol/L — ABNORMAL HIGH (ref 22–32)
Calcium: 9 mg/dL (ref 8.9–10.3)
Chloride: 90 mmol/L — ABNORMAL LOW (ref 98–111)
Creatinine, Ser: 1.08 mg/dL — ABNORMAL HIGH (ref 0.44–1.00)
GFR, Estimated: >60 mL/min (ref >60)
Glucose, Bld: 151 mg/dL — ABNORMAL HIGH (ref 70–99)
Potassium: 4.1 mmol/L (ref 3.5–5.1)
Sodium: 135 mmol/L (ref 135–145)

## 2024-05-13 LAB — GLUCOSE, CAPILLARY
Glucose-Capillary: 177 mg/dL — ABNORMAL HIGH (ref 70–99)
Glucose-Capillary: 142 mg/dL — ABNORMAL HIGH (ref 70–99)
Glucose-Capillary: 166 mg/dL — ABNORMAL HIGH (ref 70–99)
Glucose-Capillary: 215 mg/dL — ABNORMAL HIGH (ref 70–99)
Glucose-Capillary: 156 mg/dL — ABNORMAL HIGH (ref 70–99)

## 2024-05-13 LAB — HEMOGLOBIN A1C
Hgb A1c MFr Bld: 10 % — ABNORMAL HIGH (ref 4.8–5.6)
Mean Plasma Glucose: 240.3 mg/dL

## 2024-05-13 MED ORDER — TRAZODONE HCL 50 MG PO TABS
50.0000 mg | ORAL_TABLET | Freq: Every evening | ORAL | Status: DC | PRN
  Administered 2024-05-13 – 2024-05-14 (×2): 50 mg via ORAL
  Filled 2024-05-13 (×2): qty 1

## 2024-05-13 MED ORDER — ACETAZOLAMIDE 250 MG PO TABS
500.0000 mg | ORAL_TABLET | Freq: Two times a day (BID) | ORAL | Status: DC
  Administered 2024-05-13 – 2024-05-15 (×5): 500 mg via ORAL
  Filled 2024-05-13 (×6): qty 2

## 2024-05-13 MED ORDER — ACETAMINOPHEN 325 MG PO TABS
650.0000 mg | ORAL_TABLET | Freq: Four times a day (QID) | ORAL | Status: DC | PRN
  Administered 2024-05-13: 650 mg via ORAL
  Filled 2024-05-13: qty 2

## 2024-05-13 MED ORDER — CEFUROXIME AXETIL 250 MG PO TABS
500.0000 mg | ORAL_TABLET | Freq: Two times a day (BID) | ORAL | Status: DC
Start: 2024-05-13 — End: 2024-05-16
  Administered 2024-05-13 – 2024-05-16 (×6): 500 mg via ORAL
  Filled 2024-05-13 (×6): qty 2

## 2024-05-13 MED ORDER — ACETAMINOPHEN-CAFFEINE 500-65 MG PO TABS
2.0000 | ORAL_TABLET | Freq: Four times a day (QID) | ORAL | Status: DC | PRN
  Administered 2024-05-13: 2 via ORAL
  Filled 2024-05-13 (×2): qty 2

## 2024-05-13 MED ORDER — ACETAMINOPHEN-CAFFEINE 500-65 MG PO TABS
2.0000 | ORAL_TABLET | Freq: Once | ORAL | Status: AC
  Administered 2024-05-13: 2 via ORAL
  Filled 2024-05-13: qty 2

## 2024-05-13 NOTE — Inpatient Diabetes Management (Addendum)
 Inpatient Diabetes Program Recommendations  AACE/ADA: New Consensus Statement on Inpatient Glycemic Control (2015)  Target Ranges:  Prepandial:   less than 140 mg/dL      Peak postprandial:   less than 180 mg/dL (1-2 hours)      Critically ill patients:  140 - 180 mg/dL   Lab Results  Component Value Date   GLUCAP 142 (H) 05/13/2024   HGBA1C 12.9 (H) 12/03/2023    Review of Glycemic Control  Latest Reference Range & Units 05/12/24 21:37 05/13/24 06:03 05/13/24 07:25  Glucose-Capillary 70 - 99 mg/dL 739 (H) 822 (H) 857 (H)   Diabetes history: DM 2 Outpatient Diabetes medications:  Lantus  35 units daily Humalog  2-15 units tid with meals Current orders for Inpatient glycemic control:  Novolog  0-15 units tid with meals  Lantus  35 units daily Inpatient Diabetes Program Recommendations:    - Spoke to patient regarding DM management.  She states she has been doing better taking her insulin  and has been wearing sensor.  Discussed A1C from April.  She states that her blood sugars have been doing better (100-200 mg/dL).  Recommend rechecking A1C.    Thanks,  Randall Bullocks, RN, BC-ADM Inpatient Diabetes Coordinator Pager [phone]  (8a-5p)

## 2024-05-13 NOTE — Plan of Care (Signed)
  Problem: Education: Goal: Knowledge of General Education information will improve Description: Including pain rating scale, medication(s)/side effects and non-pharmacologic comfort measures Outcome: Progressing   Problem: Education: Goal: Ability to describe self-care measures that may prevent or decrease complications (Diabetes Survival Skills Education) will improve Outcome: Progressing   Problem: Coping: Goal: Ability to adjust to condition or change in health will improve Outcome: Progressing   Problem: Education: Goal: Knowledge of General Education information will improve Description: Including pain rating scale, medication(s)/side effects and non-pharmacologic comfort measures Outcome: Progressing   Problem: Education: Goal: Ability to describe self-care measures that may prevent or decrease complications (Diabetes Survival Skills Education) will improve Outcome: Progressing   Problem: Coping: Goal: Ability to adjust to condition or change in health will improve Outcome: Progressing

## 2024-05-13 NOTE — Progress Notes (Signed)
 PROGRESS NOTE    Cheryl Mercer  FMW:[id number] DOB: [birth year] DOA: 05/12/2024 PCP: Vincente Shivers, NP  37/F with chronic systolic CHF, history of PE, migraines, type 2 diabetes mellitus poorly controlled, noncompliance presented to the ED with URI symptoms, dyspnea, has not taken meds for at least a week - Admitted with URI/rhinovirus, mild fluid overload, hyperglycemia, glucose 554, sodium 128, WBC 14, CT chest with ?multifocal pneumonia   Subjective: -Has congestion, headache  Assessment and Plan:  Acute on chronic systolic CHF - Echo 5/25 noted EF less than 20%, grade 3 DD, normal RV - Heart failure team following, does not appear much volume overloaded today - On IV Lasix , continue Entresto , Aldactone   Rhinovirus, viral pneumonia - Clinically suspicion for secondary bacterial infection is low however with leukocytosis and mildly elevated procalcitonin will treat with 5-day course of ABX, changed to oral cefuroxime   Uncontrolled type 2 diabetes mellitus - Poor compliance with insulin  prior to admission, CBGs improved continue Semglee , A1c was 13 in April, will repeat  Depression Continue Zoloft   Prior history of PE No longer on anticoagulation   DVT prophylaxis: lovenox  Code Status: Full Code Family Communication: none present Disposition Plan: Home in 1-2days  Consultants:    Procedures:   Antimicrobials:    Objective: Vitals:   05/12/24 1938 05/13/24 0003 05/13/24 0427 05/13/24 0727  BP: (!) 150/97 (!) 156/100 117/85 124/86  Pulse: 97 79 87 90  Resp: 18 20 20 19   Temp: 98.3 F (36.8 C) 98 F (36.7 C) 98 F (36.7 C) 98.1 F (36.7 C)  TempSrc: Oral Oral Oral Oral  SpO2: 96% 93% 93%   Weight:      Height:        Intake/Output Summary (Last 24 hours) at 05/13/2024 1008 Last data filed at 05/13/2024 0827 Gross per 24 hour  Intake 541.77 ml  Output 1150 ml  Net -608.23 ml   Filed Weights   05/12/24 0946 05/12/24 1548  Weight: 61.7 kg 60.3  kg    Examination:  General exam: Appears calm and comfortable ill-appearing HEENT: No JVD Respiratory system: Clear to auscultation Cardiovascular system: S1 & S2 heard, RRR.  Abd: nondistended, soft and nontender.Normal bowel sounds heard. Central nervous system: Alert and oriented. No focal neurological deficits. Extremities: no edema Skin: No rashes Psychiatry:  Mood & affect appropriate.     Data Reviewed:   CBC: Recent Labs  Lab 05/12/24 1015 05/12/24 1655  WBC 14.6* 15.9*  HGB 12.1 11.9*  HCT 35.2* 34.0*  MCV 89.1 88.3  PLT 324 330   Basic Metabolic Panel: Recent Labs  Lab 05/12/24 1015 05/12/24 1655 05/13/24 0241  NA 128*  --  135  K 4.5  --  4.1  CL 91*  --  90*  CO2 26  --  33*  GLUCOSE 554*  --  151*  BUN 12  --  15  CREATININE 1.04* 1.09* 1.08*  CALCIUM  8.6*  --  9.0   GFR: Estimated Creatinine Clearance: 62.2 mL/min (A) (by C-G formula based on SCr of 1.08 mg/dL (H)). Liver Function Tests: Recent Labs  Lab 05/12/24 1015  AST 50*  ALT 20  ALKPHOS 244*  BILITOT 0.8  PROT 6.3*  ALBUMIN 1.7*   Recent Labs  Lab 05/12/24 1015  LIPASE 41   No results for input(s): AMMONIA in the last 168 hours. Coagulation Profile: No results for input(s): INR, PROTIME in the last 168 hours. Cardiac Enzymes: No results for input(s): CKTOTAL, CKMB, CKMBINDEX, TROPONINI in the  last 168 hours. BNP (last 3 results) Recent Labs    12/03/23 1235  PROBNP 1,145.0*   HbA1C: No results for input(s): HGBA1C in the last 72 hours. CBG: Recent Labs  Lab 05/12/24 1307 05/12/24 1652 05/12/24 2137 05/13/24 0603 05/13/24 0725  GLUCAP 395* 276* 260* 177* 142*   Lipid Profile: No results for input(s): CHOL, HDL, LDLCALC, TRIG, CHOLHDL, LDLDIRECT in the last 72 hours. Thyroid  Function Tests: No results for input(s): TSH, T4TOTAL, FREET4, T3FREE, THYROIDAB in the last 72 hours. Anemia Panel: No results for input(s):  VITAMINB12, FOLATE, FERRITIN, TIBC, IRON, RETICCTPCT in the last 72 hours. Urine analysis:    Component Value Date/Time   COLORURINE YELLOW 05/12/2024 1020   APPEARANCEUR HAZY (A) 05/12/2024 1020   LABSPEC 1.029 05/12/2024 1020   PHURINE 6.0 05/12/2024 1020   GLUCOSEU >=500 (A) 05/12/2024 1020   HGBUR MODERATE (A) 05/12/2024 1020   BILIRUBINUR NEGATIVE 05/12/2024 1020   KETONESUR NEGATIVE 05/12/2024 1020   PROTEINUR >=300 (A) 05/12/2024 1020   UROBILINOGEN 0.2 02/04/2019 1720   NITRITE NEGATIVE 05/12/2024 1020   LEUKOCYTESUR NEGATIVE 05/12/2024 1020   Sepsis Labs: @LABRCNTIP (procalcitonin:4,lacticidven:4)  ) Recent Results (from the past 240 hours)  Resp panel by RT-PCR (RSV, Flu A&B, Covid) Anterior Nasal Swab     Status: None   Collection Time: 05/12/24  9:50 AM   Specimen: Anterior Nasal Swab  Result Value Ref Range Status   SARS Coronavirus 2 by RT PCR NEGATIVE NEGATIVE Final   Influenza A by PCR NEGATIVE NEGATIVE Final   Influenza B by PCR NEGATIVE NEGATIVE Final    Comment: (NOTE) The Xpert Xpress SARS-CoV-2/FLU/RSV plus assay is intended as an aid in the diagnosis of influenza from Nasopharyngeal swab specimens and should not be used as a sole basis for treatment. Nasal washings and aspirates are unacceptable for Xpert Xpress SARS-CoV-2/FLU/RSV testing.  Fact Sheet for Patients: BloggerCourse.com  Fact Sheet for Healthcare Providers: SeriousBroker.it  This test is not yet approved or cleared by the [place]  FDA and has been authorized for detection and/or diagnosis of SARS-CoV-2 by FDA under an Emergency Use Authorization (EUA). This EUA will remain in effect (meaning this test can be used) for the duration of the COVID-19 declaration under Section 564(b)(1) of the Act, 21 U.S.C. section 360bbb-3(b)(1), unless the authorization is terminated or revoked.     Resp Syncytial Virus by PCR NEGATIVE  NEGATIVE Final    Comment: (NOTE) Fact Sheet for Patients: BloggerCourse.com  Fact Sheet for Healthcare Providers: SeriousBroker.it  This test is not yet approved or cleared by the [place]  FDA and has been authorized for detection and/or diagnosis of SARS-CoV-2 by FDA under an Emergency Use Authorization (EUA). This EUA will remain in effect (meaning this test can be used) for the duration of the COVID-19 declaration under Section 564(b)(1) of the Act, 21 U.S.C. section 360bbb-3(b)(1), unless the authorization is terminated or revoked.  Performed at [facility], [address], [place], [place] [address]   Respiratory (~20 pathogens) panel by PCR     Status: Abnormal   Collection Time: 05/12/24  4:54 PM   Specimen: Nasopharyngeal Swab; Respiratory  Result Value Ref Range Status   Adenovirus NOT DETECTED NOT DETECTED Final   Coronavirus 229E NOT DETECTED NOT DETECTED Final    Comment: (NOTE) The Coronavirus on the Respiratory Panel, DOES NOT test for the novel  Coronavirus (2019 nCoV)    Coronavirus HKU1 NOT DETECTED NOT DETECTED Final   Coronavirus NL63 NOT DETECTED NOT  DETECTED Final   Coronavirus OC43 NOT DETECTED NOT DETECTED Final   Metapneumovirus NOT DETECTED NOT DETECTED Final   Rhinovirus / Enterovirus DETECTED (A) NOT DETECTED Final   Influenza A NOT DETECTED NOT DETECTED Final   Influenza B NOT DETECTED NOT DETECTED Final   Parainfluenza Virus 1 NOT DETECTED NOT DETECTED Final   Parainfluenza Virus 2 NOT DETECTED NOT DETECTED Final   Parainfluenza Virus 3 NOT DETECTED NOT DETECTED Final   Parainfluenza Virus 4 NOT DETECTED NOT DETECTED Final   Respiratory Syncytial Virus NOT DETECTED NOT DETECTED Final   Bordetella pertussis NOT DETECTED NOT DETECTED Final   Bordetella Parapertussis NOT DETECTED NOT DETECTED Final   Chlamydophila pneumoniae NOT DETECTED NOT DETECTED Final   Mycoplasma pneumoniae NOT  DETECTED NOT DETECTED Final    Comment: Performed at [facility], [address], [place], [place] [address]     Radiology Studies: CT Angio Chest PE W/Cm &/Or Wo Cm Result Date: 05/12/2024 CLINICAL DATA:  Pulmonary embolus suspected with high probability. Cough and vomiting. EXAM: CT ANGIOGRAPHY CHEST WITH CONTRAST TECHNIQUE: Multidetector CT imaging of the chest was performed using the standard protocol during bolus administration of intravenous contrast. Multiplanar CT image reconstructions and MIPs were obtained to evaluate the vascular anatomy. RADIATION DOSE REDUCTION: This exam was performed according to the departmental dose-optimization program which includes automated exposure control, adjustment of the mA and/or kV according to patient size and/or use of iterative reconstruction technique. CONTRAST:  75mL OMNIPAQUE  IOHEXOL  350 MG/ML SOLN COMPARISON:  Chest radiograph 05/12/2024.  CT chest 12/31/2021 FINDINGS: Cardiovascular: There is good opacification of the central and segmental pulmonary arteries. No focal filling defects are demonstrated. No evidence of significant pulmonary embolus. Mild cardiac enlargement. No pericardial effusions. Normal caliber thoracic aorta. Mediastinum/Nodes: Thyroid  gland is unremarkable. Esophagus is decompressed. Mild prominence of mediastinal lymph nodes without significant change since prior study. No pathologic enlargement. This is likely reactive. Lungs/Pleura: Motion artifact limits examination. There is diffuse nodular airspace infiltration demonstrated throughout both lower lungs, the right middle lung, and with patchy involvement of the upper lungs. This is likely multifocal pneumonia or aspiration. No pleural effusion or pneumothorax. Upper Abdomen: No acute abnormality. Musculoskeletal: No chest wall abnormality. No acute or significant osseous findings. Review of the MIP images confirms the above findings. IMPRESSION: 1. No evidence of significant  pulmonary embolus. 2. Mild cardiac enlargement. 3. Diffuse airspace infiltrates in the lungs most prominent in the bases. This is likely multifocal pneumonia or aspiration. Electronically Signed   By: Elsie Gravely M.D.   On: 05/12/2024 15:04   DG Chest 2 View Result Date: 05/12/2024 EXAM: 2 VIEW(S) XRAY OF THE CHEST 05/12/2024 10:21:00 AM COMPARISON: 12/22/2023 CLINICAL HISTORY: Productive cough. Reason for exam: productive cough. Per triage notes: Pt complains of productive cough x 7 days, fever 3 days ago. Vomiting started 2 days ago. Diarrhea today. Pt complains of pain in ribs from coughing. FINDINGS: LUNGS AND PLEURA: Bilateral lower lobe airspace opacities are identified. Upper lung zones are clear. No pulmonary edema. No pleural effusion. No pneumothorax. HEART AND MEDIASTINUM: No acute abnormality of the cardiac and mediastinal silhouettes. BONES AND SOFT TISSUES: No acute osseous abnormality. IMPRESSION: 1. Bilateral lower lobe airspace opacities, which may reflect an infectious or inflammatory process in the context of productive cough. 2. Upper lung zones are clear. Electronically signed by: Waddell Calk MD 05/12/2024 11:14 AM EDT RP Workstation: [id number]     Scheduled Meds:  atorvastatin   40 mg Oral Daily  benzonatate   100 mg Oral TID   digoxin   0.125 mg Oral Daily   enoxaparin  (LOVENOX ) injection  40 mg Subcutaneous Q24H   furosemide   80 mg Intravenous BID   insulin  aspart  0-15 Units Subcutaneous TID WC   insulin  glargine  35 Units Subcutaneous QHS   pneumococcal 20-valent conjugate vaccine  0.5 mL Intramuscular Tomorrow-1000   sacubitril -valsartan   1 tablet Oral BID   sertraline   100 mg Oral Daily   spironolactone   25 mg Oral Daily   Continuous Infusions:   LOS: 1 day    Time spent:    Sigurd Pac, MD [facility]   05/13/2024, 10:08 AM

## 2024-05-13 NOTE — Progress Notes (Signed)
 Advanced Heart Failure Rounding Note  Cardiologist: None  Chief Complaint: A/C HFrEF Subjective:    I&O not accurate since she was in the ED yesterday.   Resp panel + rhinovirus  Feels ok this morning just tired and with a headache.   Objective:   Weight Range: 60.3 kg Body mass index is 22.82 kg/m.   Vital Signs:   Temp:  [98 F (36.7 C)-100.1 F (37.8 C)] 98.1 F (36.7 C) (09/26 0727) Pulse Rate:  [79-115] 90 (09/26 0727) Resp:  [16-20] 19 (09/26 0727) BP: (98-171)/(80-106) 124/86 (09/26 0727) SpO2:  [93 %-100 %] 93 % (09/26 0427) Weight:  [60.3 kg-61.7 kg] 60.3 kg (09/25 1548) Last BM Date : 05/12/24  Weight change: Filed Weights   05/12/24 0946 05/12/24 1548  Weight: 61.7 kg 60.3 kg    Intake/Output:   Intake/Output Summary (Last 24 hours) at 05/13/2024 0759 Last data filed at 05/13/2024 0200 Gross per 24 hour  Intake 301.77 ml  Output 1150 ml  Net -848.23 ml     Physical Exam    General:  weak appearing.  No respiratory difficulty Neck: JVD ~7 cm.  Cor: Regular rate & rhythm. No murmurs. Lungs: diminished bases Extremities: no edema  Neuro: alert & oriented x 3. Affect pleasant.   Telemetry   NSR 90s (Personally reviewed)    Labs    CBC Recent Labs    05/12/24 1015 05/12/24 1655  WBC 14.6* 15.9*  HGB 12.1 11.9*  HCT 35.2* 34.0*  MCV 89.1 88.3  PLT 324 330   Basic Metabolic Panel Recent Labs    90/74/74 1015 05/12/24 1655 05/13/24 0241  NA 128*  --  135  K 4.5  --  4.1  CL 91*  --  90*  CO2 26  --  33*  GLUCOSE 554*  --  151*  BUN 12  --  15  CREATININE 1.04* 1.09* 1.08*  CALCIUM  8.6*  --  9.0   Liver Function Tests Recent Labs    05/12/24 1015  AST 50*  ALT 20  ALKPHOS 244*  BILITOT 0.8  PROT 6.3*  ALBUMIN 1.7*   Recent Labs    05/12/24 1015  LIPASE 41   Cardiac Enzymes No results for input(s): CKTOTAL, CKMB, CKMBINDEX, TROPONINI in the last 72 hours.  BNP: BNP (last 3 results) Recent Labs     01/13/24 1019 02/17/24 1028 05/12/24 1314  BNP 317.4* 821.4* 1,432.3*    ProBNP (last 3 results) Recent Labs    12/03/23 1235  PROBNP 1,145.0*     D-Dimer No results for input(s): DDIMER in the last 72 hours. Hemoglobin A1C No results for input(s): HGBA1C in the last 72 hours. Fasting Lipid Panel No results for input(s): CHOL, HDL, LDLCALC, TRIG, CHOLHDL, LDLDIRECT in the last 72 hours. Thyroid  Function Tests No results for input(s): TSH, T4TOTAL, T3FREE, THYROIDAB in the last 72 hours.  Invalid input(s): FREET3  Other results:   Imaging    CT Angio Chest PE W/Cm &/Or Wo Cm Result Date: 05/12/2024 CLINICAL DATA:  Pulmonary embolus suspected with high probability. Cough and vomiting. EXAM: CT ANGIOGRAPHY CHEST WITH CONTRAST TECHNIQUE: Multidetector CT imaging of the chest was performed using the standard protocol during bolus administration of intravenous contrast. Multiplanar CT image reconstructions and MIPs were obtained to evaluate the vascular anatomy. RADIATION DOSE REDUCTION: This exam was performed according to the departmental dose-optimization program which includes automated exposure control, adjustment of the mA and/or kV according to patient size and/or use  of iterative reconstruction technique. CONTRAST:  75mL OMNIPAQUE  IOHEXOL  350 MG/ML SOLN COMPARISON:  Chest radiograph 05/12/2024.  CT chest 12/31/2021 FINDINGS: Cardiovascular: There is good opacification of the central and segmental pulmonary arteries. No focal filling defects are demonstrated. No evidence of significant pulmonary embolus. Mild cardiac enlargement. No pericardial effusions. Normal caliber thoracic aorta. Mediastinum/Nodes: Thyroid  gland is unremarkable. Esophagus is decompressed. Mild prominence of mediastinal lymph nodes without significant change since prior study. No pathologic enlargement. This is likely reactive. Lungs/Pleura: Motion artifact limits examination. There is  diffuse nodular airspace infiltration demonstrated throughout both lower lungs, the right middle lung, and with patchy involvement of the upper lungs. This is likely multifocal pneumonia or aspiration. No pleural effusion or pneumothorax. Upper Abdomen: No acute abnormality. Musculoskeletal: No chest wall abnormality. No acute or significant osseous findings. Review of the MIP images confirms the above findings. IMPRESSION: 1. No evidence of significant pulmonary embolus. 2. Mild cardiac enlargement. 3. Diffuse airspace infiltrates in the lungs most prominent in the bases. This is likely multifocal pneumonia or aspiration. Electronically Signed   By: Elsie Gravely M.D.   On: 05/12/2024 15:04   DG Chest 2 View Result Date: 05/12/2024 EXAM: 2 VIEW(S) XRAY OF THE CHEST 05/12/2024 10:21:00 AM COMPARISON: 12/22/2023 CLINICAL HISTORY: Productive cough. Reason for exam: productive cough. Per triage notes: Pt complains of productive cough x 7 days, fever 3 days ago. Vomiting started 2 days ago. Diarrhea today. Pt complains of pain in ribs from coughing. FINDINGS: LUNGS AND PLEURA: Bilateral lower lobe airspace opacities are identified. Upper lung zones are clear. No pulmonary edema. No pleural effusion. No pneumothorax. HEART AND MEDIASTINUM: No acute abnormality of the cardiac and mediastinal silhouettes. BONES AND SOFT TISSUES: No acute osseous abnormality. IMPRESSION: 1. Bilateral lower lobe airspace opacities, which may reflect an infectious or inflammatory process in the context of productive cough. 2. Upper lung zones are clear. Electronically signed by: Taylor Stroud MD 05/12/2024 11:14 AM EDT RP Workstation: [id number]     Medications:     Scheduled Medications:  atorvastatin   40 mg Oral Daily   azithromycin   500 mg Oral Daily   benzonatate   100 mg Oral TID   digoxin   0.125 mg Oral Daily   enoxaparin  (LOVENOX ) injection  40 mg Subcutaneous Q24H   furosemide   40 mg Intravenous Once   furosemide    80 mg Intravenous BID   insulin  aspart  0-15 Units Subcutaneous TID WC   insulin  glargine  35 Units Subcutaneous QHS   pneumococcal 20-valent conjugate vaccine  0.5 mL Intramuscular Tomorrow-1000   sacubitril -valsartan   1 tablet Oral BID   sertraline   100 mg Oral Daily   spironolactone   25 mg Oral Daily    Infusions:  cefTRIAXone  (ROCEPHIN )  IV      PRN Medications: acetaminophen , guaiFENesin -codeine     Patient Profile   38 y/o female w/ chronic systolic heart failure, h/o PE, migraines, hypertension, poorly controlled T2DM and poor med compliance presenting w/ 1 wk h/o flu like symptoms. Off all meds x 1 wk d/t feeling poorly. Diagnosed w/ a/c CHF, hyperglycemia w/ initial glucose >550 and possible CAP.   Assessment/Plan   1. Acute on Chronic Systolic Heart Failure - Echo (10/23): EF <20%, RV severely reduced. Mild concentric LVH, LV with GHK, smoke seen in [place], RA severely dilated, mild MR - Echo (7/24): EF < 20%, RV moderately reduced  - Echo (5/25): EF <20%, RV nl  - Suspect CM 2/2 uncontrolled hypertension, ETOH, medication noncompliance and DM - Admitted  w/ volume overload and NYHA IIIb symptoms. Off all HF GDMT + diuretics x 1 wk. POCUS in ED w/ plethoric IVC. BNP 1400. CXR b/l lower lobe airspace opacities - Volume close to euvolemia, will continue diureses through today. IV Lasix  80 mg bid  - Continue Entresto  49-51 mg bid - Continue Spironolactone  25 mg daily  - hold Coreg  given ADHF w/ volume overload - Continue digoxin  0.125 mg daily   - No SGLT2i with uncontrolled T2DM, A1c 12.9    2. Respiratory - viral respiratory panel + rhinovirus - on empiric abx for suspected CAP given symptoms + presence of b/l lower lobe airspace opacities on CXR   3. Uncontrolled Type 2DM w/ Hyperglycemia - noncompliant w/ insulin   - initial Gluc 554 - SSI per primary - get back on statin    4. Hypertension  - improved now that meds have been restarted - after HF meds restarted,  add back home amlodipine  if still uncontrolled    5. H/o PE w/ pulmonary infarct - treated with eliquis  x 6 months 4/21 - chest CT this admission (-) PE  Length of Stay: 1  Beckey LITTIE Coe, NP  05/13/2024, 7:59 AM  Advanced Heart Failure Team Pager [phone] (M-F; 7a - 5p)  Please contact [facility] for night-coverage after hours (5p -7a ) and weekends on [email or web address]

## 2024-05-13 NOTE — Plan of Care (Signed)
   Problem: Education: Goal: Knowledge of General Education information will improve Description: Including pain rating scale, medication(s)/side effects and non-pharmacologic comfort measures Outcome: Progressing   Problem: Health Behavior/Discharge Planning: Goal: Ability to manage health-related needs will improve Outcome: Progressing   Problem: Clinical Measurements: Goal: Will remain free from infection Outcome: Progressing

## 2024-05-13 NOTE — TOC CM/SW Note (Signed)
 Transition of Care [facility]) - Inpatient Brief Assessment   Patient Details  Name: Cheryl Mercer MRN: [id number] Date of Birth: 02/06/1986  [facility] CM/SW Contact:    Waddell Barnie Rama, RN Phone Number: 05/13/2024, 2:30 PM   Clinical Narrative: From home with daughter , has PCP and insurance on file, states has no HH services in place at this time or DME at home.  States family member  (daughter ) will transport them home at [employer] and family is support system, states gets medications from [facility] on [address] in [place].  Pta self ambulatory.   There are no ICM  needs identified  at this time.  Please place consult for ICM  needs.     Transition of Care Asessment: Insurance and Status: Insurance coverage has been reviewed Patient has primary care physician: Yes Home environment has been reviewed: home with children Prior level of function:: indep Prior/Current Home Services: No current home services Social Drivers of Health Review: SDOH reviewed no interventions necessary Readmission risk has been reviewed: Yes Transition of care needs: no transition of care needs at this time

## 2024-05-14 DIAGNOSIS — I5043 Acute on chronic combined systolic (congestive) and diastolic (congestive) heart failure: Secondary | ICD-10-CM

## 2024-05-14 DIAGNOSIS — I5023 Acute on chronic systolic (congestive) heart failure: Secondary | ICD-10-CM | POA: Diagnosis not present

## 2024-05-14 LAB — CBC
HCT: 41.1 % (ref 36.0–46.0)
Hemoglobin: 13.9 g/dL (ref 12.0–15.0)
MCH: 30 pg (ref 26.0–34.0)
MCHC: 33.8 g/dL (ref 30.0–36.0)
MCV: 88.8 fL (ref 80.0–100.0)
Platelets: 442 K/uL — ABNORMAL HIGH (ref 150–400)
RBC: 4.63 MIL/uL (ref 3.87–5.11)
RDW: 12.8 % (ref 11.5–15.5)
WBC: 12 K/uL — ABNORMAL HIGH (ref 4.0–10.5)
nRBC: 0 % (ref 0.0–0.2)

## 2024-05-14 LAB — BASIC METABOLIC PANEL WITH GFR
Anion gap: 14 (ref 5–15)
BUN: 22 mg/dL — ABNORMAL HIGH (ref 6–20)
CO2: 30 mmol/L (ref 22–32)
Calcium: 8.3 mg/dL — ABNORMAL LOW (ref 8.9–10.3)
Chloride: 90 mmol/L — ABNORMAL LOW (ref 98–111)
Creatinine, Ser: 1.5 mg/dL — ABNORMAL HIGH (ref 0.44–1.00)
GFR, Estimated: 46 mL/min — ABNORMAL LOW (ref >60)
Glucose, Bld: 171 mg/dL — ABNORMAL HIGH (ref 70–99)
Potassium: 3.2 mmol/L — ABNORMAL LOW (ref 3.5–5.1)
Sodium: 134 mmol/L — ABNORMAL LOW (ref 135–145)

## 2024-05-14 LAB — GLUCOSE, CAPILLARY
Glucose-Capillary: 184 mg/dL — ABNORMAL HIGH (ref 70–99)
Glucose-Capillary: 117 mg/dL — ABNORMAL HIGH (ref 70–99)
Glucose-Capillary: 185 mg/dL — ABNORMAL HIGH (ref 70–99)
Glucose-Capillary: 216 mg/dL — ABNORMAL HIGH (ref 70–99)
Glucose-Capillary: 271 mg/dL — ABNORMAL HIGH (ref 70–99)

## 2024-05-14 MED ORDER — TORSEMIDE 20 MG PO TABS
80.0000 mg | ORAL_TABLET | Freq: Every day | ORAL | Status: DC
  Administered 2024-05-15 – 2024-05-16 (×2): 80 mg via ORAL
  Filled 2024-05-14 (×3): qty 4

## 2024-05-14 MED ORDER — CARVEDILOL 12.5 MG PO TABS
12.5000 mg | ORAL_TABLET | Freq: Two times a day (BID) | ORAL | Status: DC
  Administered 2024-05-14 – 2024-05-16 (×4): 12.5 mg via ORAL
  Filled 2024-05-14 (×5): qty 1

## 2024-05-14 MED ORDER — POTASSIUM CHLORIDE CRYS ER 20 MEQ PO TBCR
40.0000 meq | EXTENDED_RELEASE_TABLET | Freq: Once | ORAL | Status: AC
  Administered 2024-05-14: 40 meq via ORAL
  Filled 2024-05-14: qty 2

## 2024-05-14 MED ORDER — ACETAMINOPHEN 325 MG PO TABS
650.0000 mg | ORAL_TABLET | Freq: Four times a day (QID) | ORAL | Status: DC | PRN
  Administered 2024-05-14 – 2024-05-15 (×3): 650 mg via ORAL
  Filled 2024-05-14 (×3): qty 2

## 2024-05-14 NOTE — Plan of Care (Signed)
  Problem: Education: Goal: Knowledge of General Education information will improve Description: Including pain rating scale, medication(s)/side effects and non-pharmacologic comfort measures Outcome: Progressing   Problem: Clinical Measurements: Goal: Ability to maintain clinical measurements within normal limits will improve Outcome: Progressing   Problem: Clinical Measurements: Goal: Will remain free from infection Outcome: Progressing   Problem: Education: Goal: Ability to describe self-care measures that may prevent or decrease complications (Diabetes Survival Skills Education) will improve Outcome: Progressing   Problem: Skin Integrity: Goal: Risk for impaired skin integrity will decrease Outcome: Progressing   Problem: Safety: Goal: Ability to remain free from injury will improve Outcome: Progressing   Problem: Education: Goal: Individualized Educational Video(s) Outcome: Progressing

## 2024-05-14 NOTE — Progress Notes (Signed)
 PROGRESS NOTE    Cheryl Mercer  FMW:[id number] DOB: 01/18/1986 DOA: 05/12/2024 PCP: Vincente Shivers, NP  37/F with chronic systolic CHF, history of PE, migraines, type 2 diabetes mellitus poorly controlled, noncompliance presented to the ED with URI symptoms, dyspnea, has not taken meds for at least a week - Admitted with URI/rhinovirus, mild fluid overload, hyperglycemia, glucose 554, sodium 128, WBC 14, CT chest with ?multifocal pneumonia   Subjective: -Has congestion, headache  Assessment and Plan:  Acute on chronic systolic CHF - Echo 5/25 noted EF less than 20%, grade 3 DD, normal RV - Cards following, appears euvolemic, switch to oral diuretics today - However BP in the 80s now with dizziness, hold further torsemide  and Entresto  today  Rhinovirus, viral pneumonia - Clinically suspicion for secondary bacterial infection is low however with leukocytosis and mildly elevated procalcitonin will treat with 5-day course of ABX, changed to oral cefuroxime   Uncontrolled type 2 diabetes mellitus - Poor compliance with insulin  prior to admission, CBGs improved continue Semglee , A1c was 13 in April, repeat is 10.0 - Compliance emphasized  Depression Continue Zoloft   Prior history of PE No longer on anticoagulation   DVT prophylaxis: lovenox  Code Status: Full Code Family Communication: Family at bedside Disposition Plan: Home in 1-2days  Consultants:    Procedures:   Antimicrobials:    Objective: Vitals:   05/14/24 0443 05/14/24 0751 05/14/24 0759 05/14/24 0813  BP: 120/78 93/68 (!) 88/63 (!) 87/71  Pulse: 89 90    Resp: 20 18    Temp: 97.6 F (36.4 C) 98.1 F (36.7 C) 98.1 F (36.7 C)   TempSrc: Oral Oral Oral   SpO2: 98% 98%  97%  Weight:      Height:        Intake/Output Summary (Last 24 hours) at 05/14/2024 1103 Last data filed at 05/14/2024 0900 Gross per 24 hour  Intake 956 ml  Output 900 ml  Net 56 ml   Filed Weights   05/12/24 0946 05/12/24  1548  Weight: 61.7 kg 60.3 kg    Examination:  General exam: Appears calm and comfortable ill-appearing HEENT: No JVD Respiratory system: Clear to auscultation Cardiovascular system: S1 & S2 heard, RRR.  Abd: nondistended, soft and nontender.Normal bowel sounds heard. Central nervous system: Alert and oriented. No focal neurological deficits. Extremities: no edema Skin: No rashes Psychiatry:  Mood & affect appropriate.     Data Reviewed:   CBC: Recent Labs  Lab 05/12/24 1015 05/12/24 1655 05/14/24 0228  WBC 14.6* 15.9* 12.0*  HGB 12.1 11.9* 13.9  HCT 35.2* 34.0* 41.1  MCV 89.1 88.3 88.8  PLT 324 330 442*   Basic Metabolic Panel: Recent Labs  Lab 05/12/24 1015 05/12/24 1655 05/13/24 0241 05/14/24 0228  NA 128*  --  135 134*  K 4.5  --  4.1 3.2*  CL 91*  --  90* 90*  CO2 26  --  33* 30  GLUCOSE 554*  --  151* 171*  BUN 12  --  15 22*  CREATININE 1.04* 1.09* 1.08* 1.50*  CALCIUM  8.6*  --  9.0 8.3*   GFR: Estimated Creatinine Clearance: 44.8 mL/min (A) (by C-G formula based on SCr of 1.5 mg/dL (H)). Liver Function Tests: Recent Labs  Lab 05/12/24 1015  AST 50*  ALT 20  ALKPHOS 244*  BILITOT 0.8  PROT 6.3*  ALBUMIN 1.7*   Recent Labs  Lab 05/12/24 1015  LIPASE 41   No results for input(s): AMMONIA in the last 168 hours.  Coagulation Profile: No results for input(s): INR, PROTIME in the last 168 hours. Cardiac Enzymes: No results for input(s): CKTOTAL, CKMB, CKMBINDEX, TROPONINI in the last 168 hours. BNP (last 3 results) Recent Labs    12/03/23 1235  PROBNP 1,145.0*   HbA1C: Recent Labs    05/13/24 0241  HGBA1C 10.0*   CBG: Recent Labs  Lab 05/13/24 1140 05/13/24 1601 05/13/24 2108 05/14/24 0610 05/14/24 0806  GLUCAP 166* 215* 156* 184* 117*   Lipid Profile: No results for input(s): CHOL, HDL, LDLCALC, TRIG, CHOLHDL, LDLDIRECT in the last 72 hours. Thyroid  Function Tests: No results for input(s):  TSH, T4TOTAL, FREET4, T3FREE, THYROIDAB in the last 72 hours. Anemia Panel: No results for input(s): VITAMINB12, FOLATE, FERRITIN, TIBC, IRON, RETICCTPCT in the last 72 hours. Urine analysis:    Component Value Date/Time   COLORURINE YELLOW 05/12/2024 1020   APPEARANCEUR HAZY (A) 05/12/2024 1020   LABSPEC 1.029 05/12/2024 1020   PHURINE 6.0 05/12/2024 1020   GLUCOSEU >=500 (A) 05/12/2024 1020   HGBUR MODERATE (A) 05/12/2024 1020   BILIRUBINUR NEGATIVE 05/12/2024 1020   KETONESUR NEGATIVE 05/12/2024 1020   PROTEINUR >=300 (A) 05/12/2024 1020   UROBILINOGEN 0.2 02/04/2019 1720   NITRITE NEGATIVE 05/12/2024 1020   LEUKOCYTESUR NEGATIVE 05/12/2024 1020   Sepsis Labs: @LABRCNTIP (procalcitonin:4,lacticidven:4)  ) Recent Results (from the past 240 hours)  Resp panel by RT-PCR (RSV, Flu A&B, Covid) Anterior Nasal Swab     Status: None   Collection Time: 05/12/24  9:50 AM   Specimen: Anterior Nasal Swab  Result Value Ref Range Status   SARS Coronavirus 2 by RT PCR NEGATIVE NEGATIVE Final   Influenza A by PCR NEGATIVE NEGATIVE Final   Influenza B by PCR NEGATIVE NEGATIVE Final    Comment: (NOTE) The Xpert Xpress SARS-CoV-2/FLU/RSV plus assay is intended as an aid in the diagnosis of influenza from Nasopharyngeal swab specimens and should not be used as a sole basis for treatment. Nasal washings and aspirates are unacceptable for Xpert Xpress SARS-CoV-2/FLU/RSV testing.  Fact Sheet for Patients: BloggerCourse.com  Fact Sheet for Healthcare Providers: SeriousBroker.it  This test is not yet approved or cleared by the [place]  FDA and has been authorized for detection and/or diagnosis of SARS-CoV-2 by FDA under an Emergency Use Authorization (EUA). This EUA will remain in effect (meaning this test can be used) for the duration of the COVID-19 declaration under Section 564(b)(1) of the Act, 21 U.S.C. section  360bbb-3(b)(1), unless the authorization is terminated or revoked.     Resp Syncytial Virus by PCR NEGATIVE NEGATIVE Final    Comment: (NOTE) Fact Sheet for Patients: BloggerCourse.com  Fact Sheet for Healthcare Providers: SeriousBroker.it  This test is not yet approved or cleared by the [place]  FDA and has been authorized for detection and/or diagnosis of SARS-CoV-2 by FDA under an Emergency Use Authorization (EUA). This EUA will remain in effect (meaning this test can be used) for the duration of the COVID-19 declaration under Section 564(b)(1) of the Act, 21 U.S.C. section 360bbb-3(b)(1), unless the authorization is terminated or revoked.  Performed at [facility], [address] [place], [place] [address]   Respiratory (~20 pathogens) panel by PCR     Status: Abnormal   Collection Time: 05/12/24  4:54 PM   Specimen: Nasopharyngeal Swab; Respiratory  Result Value Ref Range Status   Adenovirus NOT DETECTED NOT DETECTED Final   Coronavirus 229E NOT DETECTED NOT DETECTED Final    Comment: (NOTE) The Coronavirus on the Respiratory Panel,  DOES NOT test for the novel  Coronavirus (2019 nCoV)    Coronavirus HKU1 NOT DETECTED NOT DETECTED Final   Coronavirus NL63 NOT DETECTED NOT DETECTED Final   Coronavirus OC43 NOT DETECTED NOT DETECTED Final   Metapneumovirus NOT DETECTED NOT DETECTED Final   Rhinovirus / Enterovirus DETECTED (A) NOT DETECTED Final   Influenza A NOT DETECTED NOT DETECTED Final   Influenza B NOT DETECTED NOT DETECTED Final   Parainfluenza Virus 1 NOT DETECTED NOT DETECTED Final   Parainfluenza Virus 2 NOT DETECTED NOT DETECTED Final   Parainfluenza Virus 3 NOT DETECTED NOT DETECTED Final   Parainfluenza Virus 4 NOT DETECTED NOT DETECTED Final   Respiratory Syncytial Virus NOT DETECTED NOT DETECTED Final   Bordetella pertussis NOT DETECTED NOT DETECTED Final   Bordetella Parapertussis NOT  DETECTED NOT DETECTED Final   Chlamydophila pneumoniae NOT DETECTED NOT DETECTED Final   Mycoplasma pneumoniae NOT DETECTED NOT DETECTED Final    Comment: Performed at [facility], [address], [place], [place] [address]     Radiology Studies: CT Angio Chest PE W/Cm &/Or Wo Cm Result Date: 05/12/2024 CLINICAL DATA:  Pulmonary embolus suspected with high probability. Cough and vomiting. EXAM: CT ANGIOGRAPHY CHEST WITH CONTRAST TECHNIQUE: Multidetector CT imaging of the chest was performed using the standard protocol during bolus administration of intravenous contrast. Multiplanar CT image reconstructions and MIPs were obtained to evaluate the vascular anatomy. RADIATION DOSE REDUCTION: This exam was performed according to the departmental dose-optimization program which includes automated exposure control, adjustment of the mA and/or kV according to patient size and/or use of iterative reconstruction technique. CONTRAST:  75mL OMNIPAQUE  IOHEXOL  350 MG/ML SOLN COMPARISON:  Chest radiograph 05/12/2024.  CT chest 12/31/2021 FINDINGS: Cardiovascular: There is good opacification of the central and segmental pulmonary arteries. No focal filling defects are demonstrated. No evidence of significant pulmonary embolus. Mild cardiac enlargement. No pericardial effusions. Normal caliber thoracic aorta. Mediastinum/Nodes: Thyroid  gland is unremarkable. Esophagus is decompressed. Mild prominence of mediastinal lymph nodes without significant change since prior study. No pathologic enlargement. This is likely reactive. Lungs/Pleura: Motion artifact limits examination. There is diffuse nodular airspace infiltration demonstrated throughout both lower lungs, the right middle lung, and with patchy involvement of the upper lungs. This is likely multifocal pneumonia or aspiration. No pleural effusion or pneumothorax. Upper Abdomen: No acute abnormality. Musculoskeletal: No chest wall abnormality. No acute or significant  osseous findings. Review of the MIP images confirms the above findings. IMPRESSION: 1. No evidence of significant pulmonary embolus. 2. Mild cardiac enlargement. 3. Diffuse airspace infiltrates in the lungs most prominent in the bases. This is likely multifocal pneumonia or aspiration. Electronically Signed   By: Elsie Gravely M.D.   On: 05/12/2024 15:04     Scheduled Meds:  acetaZOLAMIDE   500 mg Oral BID   atorvastatin   40 mg Oral Daily   benzonatate   100 mg Oral TID   carvedilol   12.5 mg Oral BID WC   cefUROXime   500 mg Oral BID WC   digoxin   0.125 mg Oral Daily   enoxaparin  (LOVENOX ) injection  40 mg Subcutaneous Q24H   insulin  aspart  0-15 Units Subcutaneous TID WC   insulin  glargine  35 Units Subcutaneous QHS   sacubitril -valsartan   1 tablet Oral BID   sertraline   100 mg Oral Daily   spironolactone   25 mg Oral Daily   torsemide   80 mg Oral Daily   Continuous Infusions:   LOS: 2 days    Time spent:  Sigurd Pac, MD [facility]   05/14/2024, 11:03 AM

## 2024-05-14 NOTE — Progress Notes (Signed)
   05/14/24 0813  What Happened  Was fall witnessed? No  Was patient injured? Unsure  Patient found on floor  Found by Staff-comment Ollie NT)  Stated prior activity ambulating-unassisted  Provider Notification  Provider Name/Title Fairy PEAK  Date Provider Notified 05/14/24  Time Provider Notified 0800  Method of Notification Rounds;Face-to-face  Notification Reason Fall  Provider response En route  Date of Provider Response 05/14/24  Time of Provider Response 0800  Follow Up  Family notified Yes - comment (daughter present)  Time family notified 0800  Additional tests No  Progress note created (see row info) Yes  Adult Fall Risk Assessment  Risk Factor Category (scoring not indicated) Fall has occurred during this admission (document High fall risk)  Age 38  Fall History: Fall within 6 months prior to admission 0  Elimination; Bowel and/or Urine Incontinence 0  Elimination; Bowel and/or Urine Urgency/Frequency 2  Medications: includes PCA/Opiates, Anti-convulsants, Anti-hypertensives, Diuretics, Hypnotics, Laxatives, Sedatives, and Psychotropics 5  Patient Care Equipment 0  Mobility-Assistance 0  Mobility-Gait 0  Mobility-Sensory Deficit 0  Altered awareness of immediate physical environment 0  Impulsiveness 0  Lack of understanding of one's physical/cognitive limitations 0  Total Score 7  Adult Fall Risk Interventions  Required Bundle Interventions *See Row Information* High fall risk - low, moderate, and high requirements implemented  Additional Interventions Room near nurses station;Use of appropriate toileting equipment (bedpan, BSC, etc.)  Fall intervention(s) refused/Patient educated regarding refusal Bed alarm;Nonskid socks;Open door if unsupervised  Screening for Fall Injury Risk (To be completed on HIGH fall risk patients) - Assessing Need for Floor Mats  Risk For Fall Injury- Criteria for Floor Mats Previous fall this admission  Will Implement Floor Mats Yes   Vitals  BP (!) 87/71  MAP (mmHg) 77  ECG Heart Rate 93  Oxygen Therapy  SpO2 97 %  O2 Device Room Air  Pain Assessment  Pain Scale Faces  Faces Pain Scale 0  Neurological  Neuro (WDL) X  Level of Consciousness Alert (requires much stimulation)  Orientation Level Oriented X4  Speech Clear  Neuro Symptoms Drowsiness;Fatigue  Neuro symptoms relieved by Rest

## 2024-05-14 NOTE — Progress Notes (Signed)
 Progress Note  Patient Name: Cheryl Mercer Date of Encounter: 05/14/2024  Primary Cardiologist: None   Subjective   Feels like she's getting better, doesn't have the sense that she is volume overloaded.  Inpatient Medications    Scheduled Meds:  acetaZOLAMIDE   500 mg Oral BID   atorvastatin   40 mg Oral Daily   benzonatate   100 mg Oral TID   cefUROXime   500 mg Oral BID WC   digoxin   0.125 mg Oral Daily   enoxaparin  (LOVENOX ) injection  40 mg Subcutaneous Q24H   furosemide   80 mg Intravenous BID   insulin  aspart  0-15 Units Subcutaneous TID WC   insulin  glargine  35 Units Subcutaneous QHS   pneumococcal 20-valent conjugate vaccine  0.5 mL Intramuscular Tomorrow-1000   sacubitril -valsartan   1 tablet Oral BID   sertraline   100 mg Oral Daily   spironolactone   25 mg Oral Daily   Continuous Infusions:  PRN Meds: acetaminophen -caffeine , guaiFENesin -codeine , traZODone    Vital Signs    Vitals:   05/13/24 1559 05/13/24 2000 05/13/24 2344 05/14/24 0443  BP:  122/84 113/76 120/78  Pulse:  92 91 89  Resp: 20 20 20 20   Temp:  98 F (36.7 C) 98.1 F (36.7 C) 97.6 F (36.4 C)  TempSrc: Oral Oral Axillary Oral  SpO2:  98% 96% 98%  Weight:      Height:        Intake/Output Summary (Last 24 hours) at 05/14/2024 0713 Last data filed at 05/14/2024 0533 Gross per 24 hour  Intake 1316 ml  Output 900 ml  Net 416 ml   Filed Weights   05/12/24 0946 05/12/24 1548  Weight: 61.7 kg 60.3 kg    Telemetry    Sinus rhythm, artifact - Personally Reviewed  ECG    Sinus tachycardia - Personally Reviewed  Physical Exam   GEN: No acute distress.   Neck: No JVD Cardiac: RRR, no murmurs, rubs, or gallops.  Respiratory: Clear to auscultation bilaterally. GI: Soft, nontender, non-distended  MS: No edema; No deformity. Neuro:  Nonfocal; sleeping but arousable, somnolent    Labs    Chemistry Recent Labs  Lab 05/12/24 1015 05/12/24 1655 05/13/24 0241 05/14/24 0228   NA 128*  --  135 134*  K 4.5  --  4.1 3.2*  CL 91*  --  90* 90*  CO2 26  --  33* 30  GLUCOSE 554*  --  151* 171*  BUN 12  --  15 22*  CREATININE 1.04* 1.09* 1.08* 1.50*  CALCIUM  8.6*  --  9.0 8.3*  PROT 6.3*  --   --   --   ALBUMIN 1.7*  --   --   --   AST 50*  --   --   --   ALT 20  --   --   --   ALKPHOS 244*  --   --   --   BILITOT 0.8  --   --   --   GFRNONAA >60 >60 >60 46*  ANIONGAP 11  --  12 14     Hematology Recent Labs  Lab 05/12/24 1015 05/12/24 1655 05/14/24 0228  WBC 14.6* 15.9* 12.0*  RBC 3.95 3.85* 4.63  HGB 12.1 11.9* 13.9  HCT 35.2* 34.0* 41.1  MCV 89.1 88.3 88.8  MCH 30.6 30.9 30.0  MCHC 34.4 35.0 33.8  RDW 12.7 12.6 12.8  PLT 324 330 442*    Cardiac EnzymesNo results for input(s): TROPONINI in the last 168 hours.  No results for input(s): TROPIPOC in the last 168 hours.   BNP Recent Labs  Lab 05/12/24 1314  BNP 1,432.3*     DDimer No results for input(s): DDIMER in the last 168 hours.   Summary of Pertinent studies    TTE: Pending  12/24/2023: EF < 20%, grade III diastolic dysfunction  Cardiac cath: 03/2019 - normal cors  Imaging: CT Angio 04/2024 -- no PE, diffuse infiltrates, likely pneumonia or aspiration   Patient Profile     38 y.o. female  w/ chronic systolic heart failure, h/o PE, migraines, hypertension, poorly controlled T2DM and poor med compliance presenting w/ 1 wk h/o flu like symptoms. Off all meds x 1 wk d/t feeling poorly. Diagnosed w/ a/c CHF, hyperglycemia w/ initial glucose >550 and possible CAP.   Assessment & Plan    1. Acute on Chronic Systolic Heart Failure - Echo (10/23): EF <20%, RV severely reduced. Mild concentric LVH, LV with GHK, smoke seen in LA, RA severely dilated, mild MR - Echo (7/24): EF < 20%, RV moderately reduced  - Echo (5/25): EF <20%, RV nl  - Suspect CM 2/2 uncontrolled hypertension, ETOH, medication noncompliance and DM - Admitted w/ volume overload and NYHA IIIb symptoms. Off all  HF GDMT + diuretics x 1 wk. POCU in ED w/ plethoric IVC. BNP 1400. CXR b/l lower lobe airspace opacities - Appears fairly euvolemic this AM -- can switch to home torsemide  - needs afterload reduction restart on HF GDMT  - Entresto  49-51 mg bid - Spironolactone  25 mg daily  -  resume Coreg  12.5 - digoxin  0.125 mg daily   - No SGLT2i with uncontrolled T2DM, A1c 12.9    2. Respiratory - viral respiratory panel negative  - CT consistent with PNA - on abx for CAP    3. Uncontrolled Type 2DM w/ Hyperglycemia - noncompliant w/ insulin   - initial Gluc 554, repeat POC Gluc 395 after dose of insulin  - doubt DKA  - restart insulin  regimen per IM  - get back on statin    4. Hypertension  - uncontrolled, d/t noncompliance - restart HF GDMT per above - after HF meds restarted, add back home amlodipine  if still uncontrolled    5. H/o PE w/ pulmonary infarct - treated with eliquis  x 6 months 4/21 - given presentation, plan chest CT to r/o recurrence   For questions or updates, please contact [facility] Please consult [email or web address] for contact info under Cardiology/STEMI.      Signed, Eulas FORBES Furbish, MD 05/14/2024, 7:13 AM

## 2024-05-15 DIAGNOSIS — I5023 Acute on chronic systolic (congestive) heart failure: Secondary | ICD-10-CM | POA: Diagnosis not present

## 2024-05-15 LAB — BASIC METABOLIC PANEL WITH GFR
Anion gap: 11 (ref 5–15)
BUN: 26 mg/dL — ABNORMAL HIGH (ref 6–20)
CO2: 27 mmol/L (ref 22–32)
Calcium: 8.1 mg/dL — ABNORMAL LOW (ref 8.9–10.3)
Chloride: 99 mmol/L (ref 98–111)
Creatinine, Ser: 1.7 mg/dL — ABNORMAL HIGH (ref 0.44–1.00)
GFR, Estimated: 40 mL/min — ABNORMAL LOW (ref >60)
Glucose, Bld: 278 mg/dL — ABNORMAL HIGH (ref 70–99)
Potassium: 3.9 mmol/L (ref 3.5–5.1)
Sodium: 137 mmol/L (ref 135–145)

## 2024-05-15 LAB — GLUCOSE, CAPILLARY
Glucose-Capillary: 200 mg/dL — ABNORMAL HIGH (ref 70–99)
Glucose-Capillary: 161 mg/dL — ABNORMAL HIGH (ref 70–99)
Glucose-Capillary: 116 mg/dL — ABNORMAL HIGH (ref 70–99)
Glucose-Capillary: 187 mg/dL — ABNORMAL HIGH (ref 70–99)

## 2024-05-15 MED ORDER — SACUBITRIL-VALSARTAN 24-26 MG PO TABS
1.0000 | ORAL_TABLET | Freq: Two times a day (BID) | ORAL | Status: DC
Start: 2024-05-15 — End: 2024-05-16
  Administered 2024-05-15 – 2024-05-16 (×2): 1 via ORAL
  Filled 2024-05-15 (×2): qty 1

## 2024-05-15 MED ORDER — INSULIN GLARGINE 100 UNIT/ML ~~LOC~~ SOLN
40.0000 [IU] | Freq: Every day | SUBCUTANEOUS | Status: DC
  Administered 2024-05-15: 40 [IU] via SUBCUTANEOUS
  Filled 2024-05-15 (×2): qty 0.4

## 2024-05-15 MED ORDER — INSULIN ASPART 100 UNIT/ML IJ SOLN
3.0000 [IU] | Freq: Three times a day (TID) | INTRAMUSCULAR | Status: DC
  Administered 2024-05-15 – 2024-05-16 (×3): 3 [IU] via SUBCUTANEOUS

## 2024-05-15 NOTE — Plan of Care (Signed)
  Problem: Education: Goal: Knowledge of General Education information will improve Description: Including pain rating scale, medication(s)/side effects and non-pharmacologic comfort measures Outcome: Progressing   Problem: Clinical Measurements: Goal: Ability to maintain clinical measurements within normal limits will improve Outcome: Progressing   Problem: Clinical Measurements: Goal: Will remain free from infection Outcome: Progressing   Problem: Clinical Measurements: Goal: Diagnostic test results will improve Outcome: Progressing   Problem: Clinical Measurements: Goal: Respiratory complications will improve Outcome: Progressing   Problem: Tissue Perfusion: Goal: Adequacy of tissue perfusion will improve Outcome: Progressing   Problem: Skin Integrity: Goal: Risk for impaired skin integrity will decrease Outcome: Progressing

## 2024-05-15 NOTE — Progress Notes (Signed)
 Progress Note  Patient Name: Cheryl Mercer Date of Encounter: 05/15/2024  Primary Cardiologist: None   Subjective   Feels like she's getting better, doesn't have the sense that she is volume overloaded.  GDMT, diuretics held yesterday due to hypotension. Cr 1.5 >> 1.7. +416cc  Inpatient Medications    Scheduled Meds:  acetaZOLAMIDE   500 mg Oral BID   atorvastatin   40 mg Oral Daily   benzonatate   100 mg Oral TID   carvedilol   12.5 mg Oral BID WC   cefUROXime   500 mg Oral BID WC   digoxin   0.125 mg Oral Daily   enoxaparin  (LOVENOX ) injection  40 mg Subcutaneous Q24H   insulin  aspart  0-15 Units Subcutaneous TID WC   insulin  glargine  35 Units Subcutaneous QHS   sacubitril -valsartan   1 tablet Oral BID   sertraline   100 mg Oral Daily   spironolactone   25 mg Oral Daily   torsemide   80 mg Oral Daily   Continuous Infusions:  PRN Meds: acetaminophen , acetaminophen -caffeine , guaiFENesin -codeine , traZODone    Vital Signs    Vitals:   05/14/24 1525 05/14/24 1928 05/15/24 0035 05/15/24 0330  BP: 110/77 104/71 117/80 108/71  Pulse: 86 80 80 81  Resp: 19 20 16 18   Temp: (!) 96.2 F (35.7 C) 98.2 F (36.8 C) 98.2 F (36.8 C) 98 F (36.7 C)  TempSrc: Oral Oral Oral Oral  SpO2: 98% 98% 93% 97%  Weight:      Height:        Intake/Output Summary (Last 24 hours) at 05/15/2024 0747 Last data filed at 05/14/2024 1700 Gross per 24 hour  Intake 600 ml  Output --  Net 600 ml   Filed Weights   05/12/24 0946 05/12/24 1548  Weight: 61.7 kg 60.3 kg    Telemetry    Sinus rhythm, artifact - Personally Reviewed  ECG    Sinus tachycardia - Personally Reviewed  Physical Exam   GEN: No acute distress.   Neck: + JVD Cardiac: RRR, no murmurs, rubs, or gallops.  Respiratory: Clear to auscultation bilaterally. GI: Soft, nontender, non-distended  MS: No edema; No deformity. Neuro:  Nonfocal; sleeping but arousable, somnolent    Labs    Chemistry Recent Labs   Lab 05/12/24 1015 05/12/24 1655 05/13/24 0241 05/14/24 0228 05/15/24 0208  NA 128*  --  135 134* 137  K 4.5  --  4.1 3.2* 3.9  CL 91*  --  90* 90* 99  CO2 26  --  33* 30 27  GLUCOSE 554*  --  151* 171* 278*  BUN 12  --  15 22* 26*  CREATININE 1.04*   < > 1.08* 1.50* 1.70*  CALCIUM  8.6*  --  9.0 8.3* 8.1*  PROT 6.3*  --   --   --   --   ALBUMIN 1.7*  --   --   --   --   AST 50*  --   --   --   --   ALT 20  --   --   --   --   ALKPHOS 244*  --   --   --   --   BILITOT 0.8  --   --   --   --   GFRNONAA >60   < > >60 46* 40*  ANIONGAP 11  --  12 14 11    < > = values in this interval not displayed.     Hematology Recent Labs  Lab 05/12/24 1015 05/12/24  1655 05/14/24 0228  WBC 14.6* 15.9* 12.0*  RBC 3.95 3.85* 4.63  HGB 12.1 11.9* 13.9  HCT 35.2* 34.0* 41.1  MCV 89.1 88.3 88.8  MCH 30.6 30.9 30.0  MCHC 34.4 35.0 33.8  RDW 12.7 12.6 12.8  PLT 324 330 442*    Cardiac EnzymesNo results for input(s): TROPONINI in the last 168 hours. No results for input(s): TROPIPOC in the last 168 hours.   BNP Recent Labs  Lab 05/12/24 1314  BNP 1,432.3*     DDimer No results for input(s): DDIMER in the last 168 hours.   Summary of Pertinent studies    TTE: 12/24/2023: EF < 20%, grade III diastolic dysfunction  Cardiac cath: 03/2019 - normal cors  Imaging: CT Angio 04/2024 -- no PE, diffuse infiltrates, likely pneumonia or aspiration   Patient Profile     38 y.o. female  w/ chronic systolic heart failure, h/o PE, migraines, hypertension, poorly controlled T2DM and poor med compliance presenting w/ 1 wk h/o flu like symptoms. Off all meds x 1 wk d/t feeling poorly. Diagnosed w/ a/c CHF, hyperglycemia w/ initial glucose >550 and possible CAP.   Assessment & Plan    1. Acute on Chronic Systolic Heart Failure - Echo (10/23): EF <20%, RV severely reduced. Mild concentric LVH, LV with GHK, smoke seen in [place], RA severely dilated, mild MR - Echo (7/24): EF < 20%, RV  moderately reduced  - Echo (5/25): EF <20%, RV nl  - Suspect CM 2/2 uncontrolled hypertension, ETOH, medication noncompliance and DM - Admitted w/ volume overload and NYHA IIIb symptoms. Off all HF GDMT + diuretics x 1 wk. POCU in ED w/ plethoric IVC. BNP 1400. CXR b/l lower lobe airspace opacities - Appears fairly euvolemic this AM -- resume home torsemide  - continue Entresto  97-103 mg bid - Spironolactone  25 mg daily  - Coreg  12.5 - digoxin  0.125 mg daily   - No SGLT2i with uncontrolled T2DM, A1c 12.9    2. Respiratory - viral respiratory panel negative  - CT consistent with PNA - on abx for CAP    3. Uncontrolled Type 2DM w/ Hyperglycemia - noncompliant w/ insulin   - initial Gluc 554, repeat POC Gluc 395 after dose of insulin  - doubt DKA  - restart insulin  regimen per IM  - get back on statin    4. Hypertension  - uncontrolled, d/t noncompliance - restart HF GDMT per above - after HF meds restarted, add back home amlodipine  if still uncontrolled    5. H/o PE w/ pulmonary infarct - treated with eliquis  x 6 months 4/21 - given presentation, plan chest CT to r/o recurrence   For questions or updates, please contact [facility] Please consult [email or web address] for contact info under Cardiology/STEMI.      Signed, Eulas FORBES Furbish, MD 05/15/2024, 7:47 AM

## 2024-05-15 NOTE — Progress Notes (Signed)
 PROGRESS NOTE    Cheryl Mercer  FMW:[id number] DOB: [birth year] DOA: 05/12/2024 PCP: Vincente Shivers, NP  37/F with chronic systolic CHF, history of PE, migraines, type 2 diabetes mellitus poorly controlled, noncompliance presented to the ED with URI symptoms, dyspnea, has not taken meds for at least a week - Admitted with URI/rhinovirus, mild fluid overload, hyperglycemia, glucose 554, sodium 128, WBC 14, CT chest with ?multifocal pneumonia  Subjective: - Hip hurting after fall, ambulating okay, denies dyspnea, blood pressure better today  Assessment and Plan:  Acute on chronic systolic CHF - Echo 5/25 noted EF less than 20%, grade 3 DD, normal RV - Cards following, appears euvolemic,  -BP dropped to 80s yesterday with fall and dizziness, Entresto  and torsemide  held -Restart Entresto  at lower dose, torsemide  --Increase activity, monitor BP  Rhinovirus, viral pneumonia - Clinically suspicion for secondary bacterial infection is low however with leukocytosis and mildly elevated procalcitonin, plan to treat with 5-day course of ABX, changed to oral cefuroxime   Uncontrolled type 2 diabetes mellitus - Poor compliance with insulin  prior to admission, CBGs improving, A1c was 13 in April, repeat is 10.0 - Compliance emphasized, increase glargine dose  Depression Continue Zoloft   Prior history of PE No longer on anticoagulation   DVT prophylaxis: lovenox  Code Status: Full Code Family Communication: Family at bedside Disposition Plan: Home in 1-2days  Consultants:    Procedures:   Antimicrobials:    Objective: Vitals:   05/15/24 0330 05/15/24 0725 05/15/24 0804 05/15/24 0925  BP: 108/71 111/80 126/82   Pulse: 81 84 81 84  Resp: 18 20 (!) 22   Temp: 98 F (36.7 C) 97.8 F (36.6 C) 98.4 F (36.9 C)   TempSrc: Oral Oral Oral   SpO2: 97% 98% 97%   Weight:      Height:        Intake/Output Summary (Last 24 hours) at 05/15/2024 1100 Last data filed at 05/15/2024  0900 Gross per 24 hour  Intake 720 ml  Output --  Net 720 ml   Filed Weights   05/12/24 0946 05/12/24 1548  Weight: 61.7 kg 60.3 kg    Examination:  General exam: Appears calm and comfortable ill-appearing HEENT: No JVD Respiratory system: Clear to auscultation Cardiovascular system: S1 & S2 heard, RRR.  Abd: nondistended, soft and nontender.Normal bowel sounds heard. Central nervous system: Alert and oriented. No focal neurological deficits. Extremities: no edema Skin: No rashes Psychiatry:  Mood & affect appropriate.     Data Reviewed:   CBC: Recent Labs  Lab 05/12/24 1015 05/12/24 1655 05/14/24 0228  WBC 14.6* 15.9* 12.0*  HGB 12.1 11.9* 13.9  HCT 35.2* 34.0* 41.1  MCV 89.1 88.3 88.8  PLT 324 330 442*   Basic Metabolic Panel: Recent Labs  Lab 05/12/24 1015 05/12/24 1655 05/13/24 0241 05/14/24 0228 05/15/24 0208  NA 128*  --  135 134* 137  K 4.5  --  4.1 3.2* 3.9  CL 91*  --  90* 90* 99  CO2 26  --  33* 30 27  GLUCOSE 554*  --  151* 171* 278*  BUN 12  --  15 22* 26*  CREATININE 1.04* 1.09* 1.08* 1.50* 1.70*  CALCIUM  8.6*  --  9.0 8.3* 8.1*   GFR: Estimated Creatinine Clearance: 39.5 mL/min (A) (by C-G formula based on SCr of 1.7 mg/dL (H)). Liver Function Tests: Recent Labs  Lab 05/12/24 1015  AST 50*  ALT 20  ALKPHOS 244*  BILITOT 0.8  PROT 6.3*  ALBUMIN 1.7*  Recent Labs  Lab 05/12/24 1015  LIPASE 41   No results for input(s): AMMONIA in the last 168 hours. Coagulation Profile: No results for input(s): INR, PROTIME in the last 168 hours. Cardiac Enzymes: No results for input(s): CKTOTAL, CKMB, CKMBINDEX, TROPONINI in the last 168 hours. BNP (last 3 results) Recent Labs    12/03/23 1235  PROBNP 1,145.0*   HbA1C: Recent Labs    05/13/24 0241  HGBA1C 10.0*   CBG: Recent Labs  Lab 05/14/24 0806 05/14/24 1128 05/14/24 1527 05/14/24 2110 05/15/24 0607  GLUCAP 117* 185* 216* 271* 200*   Lipid Profile: No  results for input(s): CHOL, HDL, LDLCALC, TRIG, CHOLHDL, LDLDIRECT in the last 72 hours. Thyroid  Function Tests: No results for input(s): TSH, T4TOTAL, FREET4, T3FREE, THYROIDAB in the last 72 hours. Anemia Panel: No results for input(s): VITAMINB12, FOLATE, FERRITIN, TIBC, IRON, RETICCTPCT in the last 72 hours. Urine analysis:    Component Value Date/Time   COLORURINE YELLOW 05/12/2024 1020   APPEARANCEUR HAZY (A) 05/12/2024 1020   LABSPEC 1.029 05/12/2024 1020   PHURINE 6.0 05/12/2024 1020   GLUCOSEU >=500 (A) 05/12/2024 1020   HGBUR MODERATE (A) 05/12/2024 1020   BILIRUBINUR NEGATIVE 05/12/2024 1020   KETONESUR NEGATIVE 05/12/2024 1020   PROTEINUR >=300 (A) 05/12/2024 1020   UROBILINOGEN 0.2 02/04/2019 1720   NITRITE NEGATIVE 05/12/2024 1020   LEUKOCYTESUR NEGATIVE 05/12/2024 1020   Sepsis Labs: @LABRCNTIP (procalcitonin:4,lacticidven:4)  ) Recent Results (from the past 240 hours)  Resp panel by RT-PCR (RSV, Flu A&B, Covid) Anterior Nasal Swab     Status: None   Collection Time: 05/12/24  9:50 AM   Specimen: Anterior Nasal Swab  Result Value Ref Range Status   SARS Coronavirus 2 by RT PCR NEGATIVE NEGATIVE Final   Influenza A by PCR NEGATIVE NEGATIVE Final   Influenza B by PCR NEGATIVE NEGATIVE Final    Comment: (NOTE) The Xpert Xpress SARS-CoV-2/FLU/RSV plus assay is intended as an aid in the diagnosis of influenza from Nasopharyngeal swab specimens and should not be used as a sole basis for treatment. Nasal washings and aspirates are unacceptable for Xpert Xpress SARS-CoV-2/FLU/RSV testing.  Fact Sheet for Patients: BloggerCourse.com  Fact Sheet for Healthcare Providers: SeriousBroker.it  This test is not yet approved or cleared by the [place]  FDA and has been authorized for detection and/or diagnosis of SARS-CoV-2 by FDA under an Emergency Use Authorization (EUA). This EUA  will remain in effect (meaning this test can be used) for the duration of the COVID-19 declaration under Section 564(b)(1) of the Act, 21 U.S.C. section 360bbb-3(b)(1), unless the authorization is terminated or revoked.     Resp Syncytial Virus by PCR NEGATIVE NEGATIVE Final    Comment: (NOTE) Fact Sheet for Patients: BloggerCourse.com  Fact Sheet for Healthcare Providers: SeriousBroker.it  This test is not yet approved or cleared by the [place]  FDA and has been authorized for detection and/or diagnosis of SARS-CoV-2 by FDA under an Emergency Use Authorization (EUA). This EUA will remain in effect (meaning this test can be used) for the duration of the COVID-19 declaration under Section 564(b)(1) of the Act, 21 U.S.C. section 360bbb-3(b)(1), unless the authorization is terminated or revoked.  Performed at [facility], [address], [place], [place] [address]   Respiratory (~20 pathogens) panel by PCR     Status: Abnormal   Collection Time: 05/12/24  4:54 PM   Specimen: Nasopharyngeal Swab; Respiratory  Result Value Ref Range Status   Adenovirus NOT DETECTED NOT DETECTED  Final   Coronavirus 229E NOT DETECTED NOT DETECTED Final    Comment: (NOTE) The Coronavirus on the Respiratory Panel, DOES NOT test for the novel  Coronavirus (2019 nCoV)    Coronavirus HKU1 NOT DETECTED NOT DETECTED Final   Coronavirus NL63 NOT DETECTED NOT DETECTED Final   Coronavirus OC43 NOT DETECTED NOT DETECTED Final   Metapneumovirus NOT DETECTED NOT DETECTED Final   Rhinovirus / Enterovirus DETECTED (A) NOT DETECTED Final   Influenza A NOT DETECTED NOT DETECTED Final   Influenza B NOT DETECTED NOT DETECTED Final   Parainfluenza Virus 1 NOT DETECTED NOT DETECTED Final   Parainfluenza Virus 2 NOT DETECTED NOT DETECTED Final   Parainfluenza Virus 3 NOT DETECTED NOT DETECTED Final   Parainfluenza Virus 4 NOT DETECTED NOT DETECTED Final    Respiratory Syncytial Virus NOT DETECTED NOT DETECTED Final   Bordetella pertussis NOT DETECTED NOT DETECTED Final   Bordetella Parapertussis NOT DETECTED NOT DETECTED Final   Chlamydophila pneumoniae NOT DETECTED NOT DETECTED Final   Mycoplasma pneumoniae NOT DETECTED NOT DETECTED Final    Comment: Performed at [facility], [address] [place], [place] [address]     Radiology Studies: No results found.    Scheduled Meds:  acetaZOLAMIDE   500 mg Oral BID   atorvastatin   40 mg Oral Daily   benzonatate   100 mg Oral TID   carvedilol   12.5 mg Oral BID WC   cefUROXime   500 mg Oral BID WC   digoxin   0.125 mg Oral Daily   enoxaparin  (LOVENOX ) injection  40 mg Subcutaneous Q24H   insulin  aspart  0-15 Units Subcutaneous TID WC   insulin  glargine  35 Units Subcutaneous QHS   sacubitril -valsartan   1 tablet Oral BID   sertraline   100 mg Oral Daily   spironolactone   25 mg Oral Daily   torsemide   80 mg Oral Daily   Continuous Infusions:   LOS: 3 days    Time spent:    Sigurd Pac, MD [facility]   05/15/2024, 11:00 AM

## 2024-05-15 NOTE — Plan of Care (Signed)

## 2024-05-16 ENCOUNTER — Other Ambulatory Visit (HOSPITAL_COMMUNITY): Payer: Self-pay

## 2024-05-16 ENCOUNTER — Telehealth (HOSPITAL_COMMUNITY): Payer: Self-pay | Admitting: Pharmacy Technician

## 2024-05-16 DIAGNOSIS — I5043 Acute on chronic combined systolic (congestive) and diastolic (congestive) heart failure: Secondary | ICD-10-CM | POA: Diagnosis not present

## 2024-05-16 LAB — BASIC METABOLIC PANEL WITH GFR
Anion gap: 8 (ref 5–15)
BUN: 24 mg/dL — ABNORMAL HIGH (ref 6–20)
CO2: 28 mmol/L (ref 22–32)
Calcium: 8.2 mg/dL — ABNORMAL LOW (ref 8.9–10.3)
Chloride: 101 mmol/L (ref 98–111)
Creatinine, Ser: 1.58 mg/dL — ABNORMAL HIGH (ref 0.44–1.00)
GFR, Estimated: 43 mL/min — ABNORMAL LOW (ref >60)
Glucose, Bld: 118 mg/dL — ABNORMAL HIGH (ref 70–99)
Potassium: 3.8 mmol/L (ref 3.5–5.1)
Sodium: 137 mmol/L (ref 135–145)

## 2024-05-16 LAB — GLUCOSE, CAPILLARY: Glucose-Capillary: 120 mg/dL — ABNORMAL HIGH (ref 70–99)

## 2024-05-16 MED ORDER — POTASSIUM CHLORIDE CRYS ER 20 MEQ PO TBCR: 20.0000 meq | EXTENDED_RELEASE_TABLET | Freq: Every day | ORAL | Status: AC

## 2024-05-16 MED ORDER — SACUBITRIL-VALSARTAN 24-26 MG PO TABS
1.0000 | ORAL_TABLET | Freq: Two times a day (BID) | ORAL | 0 refills | Status: AC
  Filled 2024-05-16: qty 60, 30d supply, fill #0

## 2024-05-16 NOTE — TOC Transition Note (Signed)
 Transition of Care [facility]) - Discharge Note   Patient Details  Name: Cheryl Mercer MRN: [id number] Date of Birth: [birth year]  [facility]) CM/SW Contact:  Waddell Barnie Rama, RN Phone Number: 05/16/2024, 9:54 AM   Clinical Narrative:    For dc today, has transportation, TOC to fill meds, entresto  co pay is zero dollars per claim test by  pharmacist.         Patient Goals and CMS Choice            Discharge Placement                       Discharge Plan and Services Additional resources added to the After Visit Summary for                                       Social Drivers of Health (SDOH) Interventions SDOH Screenings   Food Insecurity: No Food Insecurity (05/12/2024)  Housing: Low Risk  (05/12/2024)  Transportation Needs: No Transportation Needs (05/12/2024)  Utilities: Not At Risk (05/12/2024)  Alcohol Screen: Low Risk  (12/21/2023)  Depression (PHQ2-9): Medium Risk (01/29/2024)  Financial Resource Strain: High Risk (12/21/2023)  Physical Activity: Insufficiently Active (12/21/2023)  Social Connections: Moderately Integrated (05/12/2024)  Stress: No Stress Concern Present (12/21/2023)  Tobacco Use: Medium Risk (02/17/2024)  Health Literacy: Adequate Health Literacy (12/21/2023)     Readmission Risk Interventions    05/13/2024    2:29 PM  Readmission Risk Prevention Plan  Post Dischage Appt Complete  Medication Screening Complete  Transportation Screening Complete

## 2024-05-16 NOTE — Telephone Encounter (Signed)
 Patient Product/process development scientist completed.    The patient is insured through [facility]. Patient has Medicare and is not eligible for a copay card, but may be able to apply for patient assistance or Medicare RX Payment Plan (Patient Must reach out to their plan, if eligible for payment plan), if available.    Ran test claim for Entresto  24-26 mg and the current 30 day co-pay is $0.00.  Ran test claim for Lantus  Pen and the current 30 day co-pay is $0.00.  Ran test claim for Humalog  Kwikpen and the current 30 day co-pay is $0.00.  This test claim was processed through [facility]- copay amounts may vary at other pharmacies due to pharmacy/plan contracts, or as the patient moves through the different stages of their insurance plan.     Reyes Sharps, CPHT Pharmacy Technician III Certified Patient Advocate [facility] Patient Advocate Team Direct Number: [phone]  Fax: [phone]

## 2024-05-16 NOTE — Progress Notes (Addendum)
 Advanced Heart Failure Rounding Note  Cardiologist: None  Chief Complaint: A/C HFrEF Subjective:    Strict I/Os and daily wts not documented, but appears to be diuresed and euvolemic.   Developed AKI w/ diuresis. SCr now trending back down, 1.08>>1.50>>1.70>>1.58. Back on PO torsemide .  Overall feeling better but c/w cough, though improving. Resp panel + rhinovirus  Objective:   Weight Range: 60.3 kg Body mass index is 22.82 kg/m.   Vital Signs:   Temp:  [97.7 F (36.5 C)-98.8 F (37.1 C)] 98 F (36.7 C) (09/29 0722) Pulse Rate:  [74-88] 74 (09/29 0722) Resp:  [16-22] 16 (09/29 0722) BP: (83-136)/(65-91) 136/91 (09/29 0722) SpO2:  [94 %-100 %] 100 % (09/29 0722) Last BM Date : 05/15/24  Weight change: Filed Weights   05/12/24 0946 05/12/24 1548  Weight: 61.7 kg 60.3 kg    Intake/Output:   Intake/Output Summary (Last 24 hours) at 05/16/2024 0731 Last data filed at 05/15/2024 1700 Gross per 24 hour  Intake 360 ml  Output 900 ml  Net -540 ml     Physical Exam   GENERAL: fatigued appearing, NAD  Lungs- decreased BS at the bases bilaterally  CARDIAC:  JVP not elevated.          Normal rate with regular rhythm. No MRG. No LEE.  ABDOMEN: Soft, non-tender, non-distended.  EXTREMITIES: Warm and well perfused.  NEUROLOGIC: No obvious FND   Telemetry   NSR 90s (Personally reviewed)    Labs    CBC Recent Labs    05/14/24 0228  WBC 12.0*  HGB 13.9  HCT 41.1  MCV 88.8  PLT 442*   Basic Metabolic Panel Recent Labs    90/71/74 0208 05/16/24 0318  NA 137 137  K 3.9 3.8  CL 99 101  CO2 27 28  GLUCOSE 278* 118*  BUN 26* 24*  CREATININE 1.70* 1.58*  CALCIUM  8.1* 8.2*   Liver Function Tests No results for input(s): AST, ALT, ALKPHOS, BILITOT, PROT, ALBUMIN in the last 72 hours.  No results for input(s): LIPASE, AMYLASE in the last 72 hours.  Cardiac Enzymes No results for input(s): CKTOTAL, CKMB, CKMBINDEX, TROPONINI  in the last 72 hours.  BNP: BNP (last 3 results) Recent Labs    01/13/24 1019 02/17/24 1028 05/12/24 1314  BNP 317.4* 821.4* 1,432.3*    ProBNP (last 3 results) Recent Labs    12/03/23 1235  PROBNP 1,145.0*     D-Dimer No results for input(s): DDIMER in the last 72 hours. Hemoglobin A1C No results for input(s): HGBA1C in the last 72 hours. Fasting Lipid Panel No results for input(s): CHOL, HDL, LDLCALC, TRIG, CHOLHDL, LDLDIRECT in the last 72 hours. Thyroid  Function Tests No results for input(s): TSH, T4TOTAL, T3FREE, THYROIDAB in the last 72 hours.  Invalid input(s): FREET3  Other results:   Imaging    No results found.    Medications:     Scheduled Medications:  acetaZOLAMIDE   500 mg Oral BID   atorvastatin   40 mg Oral Daily   benzonatate   100 mg Oral TID   carvedilol   12.5 mg Oral BID WC   cefUROXime   500 mg Oral BID WC   digoxin   0.125 mg Oral Daily   enoxaparin  (LOVENOX ) injection  40 mg Subcutaneous Q24H   insulin  aspart  0-15 Units Subcutaneous TID WC   insulin  aspart  3 Units Subcutaneous TID WC   insulin  glargine  40 Units Subcutaneous QHS   sacubitril -valsartan   1 tablet Oral BID  sertraline   100 mg Oral Daily   spironolactone   25 mg Oral Daily   torsemide   80 mg Oral Daily    Infusions:    PRN Medications: acetaminophen , acetaminophen -caffeine , guaiFENesin -codeine , traZODone     Patient Profile   38 y/o female w/ chronic systolic heart failure, h/o PE, migraines, hypertension, poorly controlled T2DM and poor med compliance presenting w/ 1 wk h/o flu like symptoms. Off all meds x 1 wk d/t feeling poorly. Diagnosed w/ a/c CHF, hyperglycemia w/ initial glucose >550 and possible CAP.   Assessment/Plan   1. Acute on Chronic Systolic Heart Failure - Echo (10/23): EF <20%, RV severely reduced. Mild concentric LVH, LV with GHK, smoke seen in LA, RA severely dilated, mild MR - Echo (7/24): EF < 20%, RV  moderately reduced  - Echo (5/25): EF <20%, RV nl  - Suspect CM 2/2 uncontrolled hypertension, ETOH, medication noncompliance and DM - Admitted w/ volume overload and NYHA IIIb symptoms. Off all HF GDMT + diuretics x 1 wk. POCUS in ED w/ plethoric IVC. BNP 1400. CXR b/l lower lobe airspace opacities - Diuresed, appears euvolemic  - Continue Torsemide  80 mg daily  - Continue Entresto  24-26 mg bid (lower dose given soft BPs and AKI) - Continue Spironolactone  25 mg daily  - Continue Coreg  12.5 mg bid  - Continue digoxin  0.125 mg daily   - No SGLT2i with uncontrolled T2DM, A1c 12.9    2. Respiratory - viral respiratory panel + rhinovirus - on empiric abx for suspected CAP given symptoms + presence of b/l lower lobe airspace opacities on CXR   3. Uncontrolled Type 2DM w/ Hyperglycemia - noncompliant w/ insulin   - initial Gluc 554 - SSI per primary - continue statin, atorva 40 mg   4. Hypertension  - improved now that meds have been restarted - after HF meds restarted, add back home amlodipine  if still uncontrolled    5. H/o PE w/ pulmonary infarct - treated with eliquis  x 6 months 4/21 - chest CT this admission (-) PE  6. AKI - b/l SCr ~1.1, bumped to 1.70 d/t over diuresis + low BP - improving, SCr down to 1.58. Continue to follow   Length of Stay: [id number] Marcine RIGGERS  05/16/2024, 7:31 AM  Advanced Heart Failure Team Pager [phone] (M-F; 7a - 5p)  Please contact [facility] for night-coverage after hours (5p -7a ) and weekends on [email or web address]   Patient seen with PA, I formulated the plan and agree with the above note.   Still with a cough.  Creatinine lower today at 1.58.  BP controlled.   General: NAD Neck: No JVD, no thyromegaly or thyroid  nodule.  Lungs: Occasional rhonchi.  CV: Nondisplaced PMI.  Heart regular S1/S2, no S3/S4, no murmur.  No peripheral edema.   Abdomen: Soft, nontender, no hepatosplenomegaly, no distention.  Skin: Intact without lesions or  rashes.  Neurologic: Alert and oriented x 3.  Psych: Normal affect. Extremities: No clubbing or cyanosis.  HEENT: Normal.   Patient looks euvolemic, creatinine trending down.  She is now on torsemide  80 mg daily and appears stable from HF perspective. Would send home on current HF regimen.  Holding off on SGLT2 inhibitor for now with uncontrolled DM, hopefully to start in future.   Off antibiotics (treated for PNA), has rhinovirus in respiratory cultures.    Needs compliance with med regimen. Will set up HF clinic followup.   Ezra Shuck 05/16/2024 7:58 AM

## 2024-05-16 NOTE — Discharge Summary (Signed)
 Physician Discharge Summary  Cheryl Mercer FMW:[id number] DOB: 11-Aug-1986 DOA: 05/12/2024  PCP: Vincente Shivers, NP  Admit date: 05/12/2024 Discharge date: 05/16/2024  Time spent: 45 minutes  Recommendations for Outpatient Follow-up:  Advanced heart failure clinic on 10/6 PCP in 1 to 2 weeks  Discharge Diagnoses:  Acute on chronic systolic CHF Rhinovirus, viral pneumonia Uncontrolled type 2 diabetes mellitus with hyperglycemia Depression History of PE  Discharge Condition: Improved  Diet recommendation: Diabetic  Filed Weights   05/12/24 0946 05/12/24 1548  Weight: 61.7 kg 60.3 kg    History of present illness:  37/F with chronic systolic CHF, history of PE, migraines, type 2 diabetes mellitus poorly controlled, noncompliance presented to the ED with URI symptoms, dyspnea, has not taken meds for at least a week - Admitted with URI/rhinovirus, mild fluid overload, hyperglycemia, glucose 554, sodium 128, WBC 14, CT chest with ?multifocal pneumonia  Hospital Course:   Acute on chronic systolic CHF - Echo 5/25 noted EF less than 20%, grade 3 DD, normal RV - Cards following, improved with diuresis, now appears euvolemic,  -BP dropped to 80s on Saturday with fall and dizziness, Entresto  and torsemide  held then - Restarted on Entresto  at lower dose, 20/26, torsemide  resumed 80 mg daily -Discharged home in stable condition, follow-up with heart failure clinic on 10/6   Rhinovirus, viral pneumonia - Clinically suspicion for secondary bacterial infection is low however with leukocytosis and mildly elevated procalcitonin, plan to treat with 5-day course of ABX, changed to oral cefuroxime , completed course   Uncontrolled type 2 diabetes mellitus - Poor compliance with insulin  prior to admission, CBGs improving, A1c was 13 in April, repeat is 10.0 - Compliance emphasized, increased glargine dose   Depression Continue Zoloft    Prior history of PE No longer on  anticoagulation  Discharge Exam: Vitals:   05/16/24 0722 05/16/24 0810  BP: (!) 136/91 110/75  Pulse: 74 76  Resp: 16   Temp: 98 F (36.7 C)   SpO2: 100% 99%   Gen: Awake, Alert, Oriented X 3,  HEENT: no JVD Lungs: Good air movement bilaterally, CTAB CVS: S1S2/RRR Abd: soft, Non tender, non distended, BS present Extremities: No edema Skin: no new rashes on exposed skin   Discharge Instructions   Discharge Instructions     Diet - low sodium heart healthy   Complete by: As directed    Increase activity slowly   Complete by: As directed       Allergies as of 05/16/2024       Reactions   Ibuprofen  Anaphylaxis        Medication List     STOP taking these medications    amLODipine  10 MG tablet Commonly known as: NORVASC    sacubitril -valsartan  97-103 MG Commonly known as: ENTRESTO  Replaced by: sacubitril -valsartan  24-26 MG       TAKE these medications    Accu-Chek Softclix Lancets lancets Use 3 (three) times daily. Use as directed to check blood sugar.   atorvastatin  40 MG tablet Commonly known as: LIPITOR Take 1 tablet (40 mg total) by mouth daily.   Blood Glucose Monitor System w/Device Kit Use 3 (three) times daily.   BLOOD GLUCOSE TEST STRIPS Strp Use 3 (three) times daily. Use as directed to check blood sugar.   Blood Pressure Monitor/Arm Devi Use to check BP daily. Dx. I10   carvedilol  12.5 MG tablet Commonly known as: COREG  Take 1 tablet (12.5 mg total) by mouth 2 (two) times daily.   digoxin  0.125 MG tablet Commonly  known as: LANOXIN  Take 1 tablet (0.125 mg total) by mouth daily.   FreeStyle Libre 3 Sensor Misc 1 each by Other route every 14 (fourteen) days.   insulin  glargine 100 UNIT/ML Solostar Pen Commonly known as: LANTUS  Inject 35 Units into the skin daily.   insulin  lispro 100 UNIT/ML KwikPen Commonly known as: HumaLOG  KwikPen Inject 2-15 Units into the skin 3 (three) times daily after meals. CBG 121 - 150: 2 units, 151  - 200: 3 units, 201 - 250: 5 units, 251 - 300: 8 units, 301 - 350: 11 units, 351 - 400: 15u   Insulin  Pen Needle 31G X 8 MM Misc Use to inject insulin  as directed.   Lancet Device Misc Use 3 (three) times daily.   potassium chloride  SA 20 MEQ tablet Commonly known as: KLOR-CON  M Take 1 tablet (20 mEq total) by mouth daily. What changed: how much to take   sacubitril -valsartan  24-26 MG Commonly known as: ENTRESTO  Take 1 tablet by mouth 2 (two) times daily. Replaces: sacubitril -valsartan  97-103 MG   sertraline  100 MG tablet Commonly known as: ZOLOFT  Take 1 tablet (100 mg total) by mouth daily.   spironolactone  25 MG tablet Commonly known as: ALDACTONE  Take 1 tablet (25 mg total) by mouth daily.   torsemide  20 MG tablet Commonly known as: DEMADEX  Take 4 tablets (80 mg total) by mouth daily.       Allergies  Allergen Reactions   Ibuprofen  Anaphylaxis    Follow-up Information     Vincente Shivers, NP Follow up on 05/23/2024.   Specialty: General Practice Why: 11:40, please arrive at 11:20, please bring any new medications Contact information: [address] [place] [address] [phone]         [facility] Follow up.   Specialty: Cardiology Why: 05/23/24 at 3:00 PM   Hospital follow up in the Advanced Heart Failure Clinic at [facility], (Dr. Nelle office) Contact information: [address] [place]   [phone]                 The results of significant diagnostics from this hospitalization (including imaging, microbiology, ancillary and laboratory) are listed below for reference.    Significant Diagnostic Studies: CT Angio Chest PE W/Cm &/Or Wo Cm Result Date: 05/12/2024 CLINICAL DATA:  Pulmonary embolus suspected with high probability. Cough and vomiting. EXAM: CT ANGIOGRAPHY CHEST WITH CONTRAST TECHNIQUE: Multidetector CT imaging of the chest was  performed using the standard protocol during bolus administration of intravenous contrast. Multiplanar CT image reconstructions and MIPs were obtained to evaluate the vascular anatomy. RADIATION DOSE REDUCTION: This exam was performed according to the departmental dose-optimization program which includes automated exposure control, adjustment of the mA and/or kV according to patient size and/or use of iterative reconstruction technique. CONTRAST:  75mL OMNIPAQUE  IOHEXOL  350 MG/ML SOLN COMPARISON:  Chest radiograph 05/12/2024.  CT chest 12/31/2021 FINDINGS: Cardiovascular: There is good opacification of the central and segmental pulmonary arteries. No focal filling defects are demonstrated. No evidence of significant pulmonary embolus. Mild cardiac enlargement. No pericardial effusions. Normal caliber thoracic aorta. Mediastinum/Nodes: Thyroid  gland is unremarkable. Esophagus is decompressed. Mild prominence of mediastinal lymph nodes without significant change since prior study. No pathologic enlargement. This is likely reactive. Lungs/Pleura: Motion artifact limits examination. There is diffuse nodular airspace infiltration demonstrated throughout both lower lungs, the right middle lung, and with patchy involvement of the upper lungs. This is likely multifocal pneumonia or aspiration. No  pleural effusion or pneumothorax. Upper Abdomen: No acute abnormality. Musculoskeletal: No chest wall abnormality. No acute or significant osseous findings. Review of the MIP images confirms the above findings. IMPRESSION: 1. No evidence of significant pulmonary embolus. 2. Mild cardiac enlargement. 3. Diffuse airspace infiltrates in the lungs most prominent in the bases. This is likely multifocal pneumonia or aspiration. Electronically Signed   By: Elsie Gravely M.D.   On: 05/12/2024 15:04   DG Chest 2 View Result Date: 05/12/2024 EXAM: 2 VIEW(S) XRAY OF THE CHEST 05/12/2024 10:21:00 AM COMPARISON: 12/22/2023 CLINICAL  HISTORY: Productive cough. Reason for exam: productive cough. Per triage notes: Pt complains of productive cough x 7 days, fever 3 days ago. Vomiting started 2 days ago. Diarrhea today. Pt complains of pain in ribs from coughing. FINDINGS: LUNGS AND PLEURA: Bilateral lower lobe airspace opacities are identified. Upper lung zones are clear. No pulmonary edema. No pleural effusion. No pneumothorax. HEART AND MEDIASTINUM: No acute abnormality of the cardiac and mediastinal silhouettes. BONES AND SOFT TISSUES: No acute osseous abnormality. IMPRESSION: 1. Bilateral lower lobe airspace opacities, which may reflect an infectious or inflammatory process in the context of productive cough. 2. Upper lung zones are clear. Electronically signed by: Waddell Calk MD 05/12/2024 11:14 AM EDT RP Workstation: [id number]    Microbiology: Recent Results (from the past 240 hours)  Resp panel by RT-PCR (RSV, Flu A&B, Covid) Anterior Nasal Swab     Status: None   Collection Time: 05/12/24  9:50 AM   Specimen: Anterior Nasal Swab  Result Value Ref Range Status   SARS Coronavirus 2 by RT PCR NEGATIVE NEGATIVE Final   Influenza A by PCR NEGATIVE NEGATIVE Final   Influenza B by PCR NEGATIVE NEGATIVE Final    Comment: (NOTE) The Xpert Xpress SARS-CoV-2/FLU/RSV plus assay is intended as an aid in the diagnosis of influenza from Nasopharyngeal swab specimens and should not be used as a sole basis for treatment. Nasal washings and aspirates are unacceptable for Xpert Xpress SARS-CoV-2/FLU/RSV testing.  Fact Sheet for Patients: BloggerCourse.com  Fact Sheet for Healthcare Providers: SeriousBroker.it  This test is not yet approved or cleared by the [place]  FDA and has been authorized for detection and/or diagnosis of SARS-CoV-2 by FDA under an Emergency Use Authorization (EUA). This EUA will remain in effect (meaning this test can be used) for the duration of  the COVID-19 declaration under Section 564(b)(1) of the Act, 21 U.S.C. section 360bbb-3(b)(1), unless the authorization is terminated or revoked.     Resp Syncytial Virus by PCR NEGATIVE NEGATIVE Final    Comment: (NOTE) Fact Sheet for Patients: BloggerCourse.com  Fact Sheet for Healthcare Providers: SeriousBroker.it  This test is not yet approved or cleared by the [place]  FDA and has been authorized for detection and/or diagnosis of SARS-CoV-2 by FDA under an Emergency Use Authorization (EUA). This EUA will remain in effect (meaning this test can be used) for the duration of the COVID-19 declaration under Section 564(b)(1) of the Act, 21 U.S.C. section 360bbb-3(b)(1), unless the authorization is terminated or revoked.  Performed at [facility], [address], [place], [place] [address]   Respiratory (~20 pathogens) panel by PCR     Status: Abnormal   Collection Time: 05/12/24  4:54 PM   Specimen: Nasopharyngeal Swab; Respiratory  Result Value Ref Range Status   Adenovirus NOT DETECTED NOT DETECTED Final   Coronavirus 229E NOT DETECTED NOT DETECTED Final    Comment: (NOTE) The Coronavirus on the Respiratory Panel, DOES  NOT test for the novel  Coronavirus (2019 nCoV)    Coronavirus HKU1 NOT DETECTED NOT DETECTED Final   Coronavirus NL63 NOT DETECTED NOT DETECTED Final   Coronavirus OC43 NOT DETECTED NOT DETECTED Final   Metapneumovirus NOT DETECTED NOT DETECTED Final   Rhinovirus / Enterovirus DETECTED (A) NOT DETECTED Final   Influenza A NOT DETECTED NOT DETECTED Final   Influenza B NOT DETECTED NOT DETECTED Final   Parainfluenza Virus 1 NOT DETECTED NOT DETECTED Final   Parainfluenza Virus 2 NOT DETECTED NOT DETECTED Final   Parainfluenza Virus 3 NOT DETECTED NOT DETECTED Final   Parainfluenza Virus 4 NOT DETECTED NOT DETECTED Final   Respiratory Syncytial Virus NOT DETECTED NOT DETECTED Final   Bordetella  pertussis NOT DETECTED NOT DETECTED Final   Bordetella Parapertussis NOT DETECTED NOT DETECTED Final   Chlamydophila pneumoniae NOT DETECTED NOT DETECTED Final   Mycoplasma pneumoniae NOT DETECTED NOT DETECTED Final    Comment: Performed at [facility], [address] [place], [place] [address]     Labs: Basic Metabolic Panel: Recent Labs  Lab 05/12/24 1015 05/12/24 1655 05/13/24 0241 05/14/24 0228 05/15/24 0208 05/16/24 0318  NA 128*  --  135 134* 137 137  K 4.5  --  4.1 3.2* 3.9 3.8  CL 91*  --  90* 90* 99 101  CO2 26  --  33* 30 27 28   GLUCOSE 554*  --  151* 171* 278* 118*  BUN 12  --  15 22* 26* 24*  CREATININE 1.04* 1.09* 1.08* 1.50* 1.70* 1.58*  CALCIUM  8.6*  --  9.0 8.3* 8.1* 8.2*   Liver Function Tests: Recent Labs  Lab 05/12/24 1015  AST 50*  ALT 20  ALKPHOS 244*  BILITOT 0.8  PROT 6.3*  ALBUMIN 1.7*   Recent Labs  Lab 05/12/24 1015  LIPASE 41   No results for input(s): AMMONIA in the last 168 hours. CBC: Recent Labs  Lab 05/12/24 1015 05/12/24 1655 05/14/24 0228  WBC 14.6* 15.9* 12.0*  HGB 12.1 11.9* 13.9  HCT 35.2* 34.0* 41.1  MCV 89.1 88.3 88.8  PLT 324 330 442*   Cardiac Enzymes: No results for input(s): CKTOTAL, CKMB, CKMBINDEX, TROPONINI in the last 168 hours. BNP: BNP (last 3 results) Recent Labs    01/13/24 1019 02/17/24 1028 05/12/24 1314  BNP 317.4* 821.4* 1,432.3*    ProBNP (last 3 results) Recent Labs    12/03/23 1235  PROBNP 1,145.0*    CBG: Recent Labs  Lab 05/15/24 0607 05/15/24 1220 05/15/24 1550 05/15/24 2139 05/16/24 0614  GLUCAP 200* 161* 116* 187* 120*       Signed:  Sigurd Pac MD.  [facility] 05/16/2024, 9:49 AM

## 2024-05-17 ENCOUNTER — Telehealth: Payer: Self-pay

## 2024-05-17 NOTE — Transitions of Care (Post Inpatient/ED Visit) (Signed)
   05/17/2024  Name: Cheryl Mercer MRN: [id number] DOB: 1986/06/20  Today's TOC FU Call Status: Today's TOC FU Call Status:: Successful TOC FU Call Completed TOC FU Call Complete Date: 05/17/24 Patient's Name and Date of Birth confirmed.  Transition Care Management Follow-up Telephone Call Date of Discharge: 05/16/24 Discharge Facility: [facility]) Type of Discharge: Inpatient Admission Primary Inpatient Discharge Diagnosis:: Acute on Chronic systolic congestive heart failure How have you been since you were released from the hospital?: Same (Patient reports sustaining fall in hospital on right hip. Patient reports pain in right hip is a 7) Any questions or concerns?: No  Items Reviewed: Did you receive and understand the discharge instructions provided?: Yes Medications obtained,verified, and reconciled?: No Medications Not Reviewed Reasons:: Other: (patient states she does not have her list with her and would like to review her medications at another time.) Any new allergies since your discharge?: No Dietary orders reviewed?: Yes Type of Diet Ordered:: diabetic diet Do you have support at home?: Yes People in Home [RPT]: child(ren), adult Name of Support/Comfort Primary Source: Raoul Nap- Cruz  Medications Reviewed Today: patient did not want to review medications today. She requested to review medications at another time. Medications Reviewed Today   Medications were not reviewed in this encounter     Home Care and Equipment/Supplies: Were Home Health Services Ordered?: No Any new equipment or medical supplies ordered?: No  Functional Questionnaire: Do you need assistance with bathing/showering or dressing?: No Do you need assistance with meal preparation?: Yes Do you need assistance with eating?: No Do you have difficulty maintaining continence: No Do you need assistance with getting out of bed/getting out of a chair/moving?: Yes Do you have difficulty  managing or taking your medications?: No  Follow up appointments reviewed: PCP Follow-up appointment confirmed?: Yes Date of PCP follow-up appointment?: 05/23/24 Follow-up Provider: Carrol Aurora Specialist [facility] Follow-up appointment confirmed?: NA Do you need transportation to your follow-up appointment?: No Do you understand care options if your condition(s) worsen?: Yes-patient verbalized understanding  SDOH Interventions Today    Flowsheet Row Most Recent Value  SDOH Interventions   Food Insecurity Interventions Intervention Not Indicated  Housing Interventions Intervention Not Indicated  Transportation Interventions Intervention Not Indicated  Utilities Interventions Other (Comment)  [refer to community resource guide]    Arvin Seip RN, Scientist, research (physical sciences), CCM [employer], Population Health Case Manager Phone: [phone]

## 2024-05-17 NOTE — Patient Instructions (Signed)
 Visit Information  Thank you for taking time to visit with me today. Please don't hesitate to contact me if I can be of assistance to you before our next scheduled telephone appointment.  Our next appointment is by telephone on 05/24/24 at 2 pm  Following is a copy of your care plan:   Goals Addressed             This Visit's Progress    VBCI Transitions of Care (TOC) Care Plan       Problems:  Recent Hospitalization for treatment of CHF and DMII Diet/Nutrition/Food Resources Patient was given a food resource list on [phone] and SDOH barrier:   has not checked on resources yet but has information.  Patient states she is having difficulty paying her utility bill.  Verbally agreed to referral to community resource guide.  Patient reports having right hip pain due to a fall she sustained in the hospital. Patient hospital staff was aware of fall.   Goal:  Over the next 30 days, the patient will not experience hospital readmission Patient A1C will decrease <12.9 Patient will purchase a scale to monitor weight to follow up with [facility] at visit-  patient states she has a scale.   Interventions:   Heart Failure Interventions: Basic overview and discussion of pathophysiology of Heart Failure reviewed Assessed need for readable accurate scales in home Discussed importance of daily weight and advised patient to weigh and record daily Reviewed role of diuretics in prevention of fluid overload and management of heart failure; Discussed the importance of keeping all appointments with provider Reviewed upcoming appointments Attempted to review medications with patient-  patient requested to review medications at another time. Advised patient to watch for increased SOB, swelling in legs/ abdomen legs, feet, and/ or hands, increase in fatigue, coughing - advised to call the doctor for these symptoms and/or call 911 for severe symptoms with breathing and/ or chest pain.  Advised to follow a low salt  diet    Diabetes Interventions: Assessed patient's understanding of A1c goal: <8% Referral sent to community resource guide for utility resources Advised to monitor blood sugar at least 2-3 times per day.  Report blood sugars > 250 or <70 Encouraged to watch carbohydrates and exercise as tolerated and as recommended by provider Lab Results  Component Value Date   HGBA1C 12.9 (H) 12/03/2023    Patient Self Care Activities:  Call pharmacy for medication refills 3-7 days in advance of running out of medications Call provider office for new concerns or questions  Notify RN Care Manager of TOC call rescheduling needs Participate in Transition of Care Program/Attend TOC scheduled calls Take medications as prescribed   call office if I gain more than 2 pounds in one day or 5 pounds in one week watch for swelling in feet, ankles and legs every day weigh myself daily eat more whole grains, fruits and vegetables, lean meats and healthy fats know when to call the doctor:call for increase in weight 2-3 lbs overnight or 5 lbs in a week, increase shortness of breath, swelling in feet/legs hands/abdomen Purchase a scale - patient reports she has a scale.   Plan:  Telephone follow up appointment with care management team member scheduled for: 05/24/24 at 2 pm          Patient verbalizes understanding of instructions and care plan provided today and agrees to view in MyChart. Active MyChart status and patient understanding of how to access instructions and care plan via MyChart  confirmed with patient.     The patient has been provided with contact information for the care management team and has been advised to call with any health related questions or concerns.   Please call the care guide team at [phone] if you need to cancel or reschedule your appointment.   Please call the Suicide and Crisis Lifeline: 988 call the [place]  National Suicide Prevention Lifeline: 352-776-4405 or TTY:  (262)625-1666 TTY 319-527-4817) to talk to a trained counselor call 1-800-273-TALK (toll free, 24 hour hotline) if you are experiencing a Mental Health or Behavioral Health Crisis or need someone to talk to.  Arvin Seip RN, BSN, CCM [employer], Population Health Case Manager Phone: [phone]

## 2024-05-23 ENCOUNTER — Encounter (HOSPITAL_COMMUNITY)

## 2024-05-23 ENCOUNTER — Inpatient Hospital Stay: Admitting: General Practice

## 2024-05-23 DIAGNOSIS — J189 Pneumonia, unspecified organism: Secondary | ICD-10-CM

## 2024-05-23 DIAGNOSIS — I5043 Acute on chronic combined systolic (congestive) and diastolic (congestive) heart failure: Secondary | ICD-10-CM

## 2024-05-23 DIAGNOSIS — Z09 Encounter for follow-up examination after completed treatment for conditions other than malignant neoplasm: Secondary | ICD-10-CM

## 2024-05-24 ENCOUNTER — Telehealth: Payer: Self-pay

## 2024-05-25 ENCOUNTER — Other Ambulatory Visit: Payer: Self-pay

## 2024-05-25 NOTE — Patient Instructions (Signed)
 Cheryl Mercer - I have attempted to call you three times but have been unsuccessful in reaching you. I work with Vincente Shivers, NP and am calling to support your healthcare needs. If I can be of assistance to you, please contact me at [phone].     Thank you,  Orlean Fey, BSW [place]  [facility] Social Worker, [employer] [phone]

## 2024-06-28 ENCOUNTER — Encounter: Payer: Self-pay | Admitting: *Deleted

## 2024-06-28 NOTE — Progress Notes (Signed)
 Cheryl Mercer                                          MRN: [id number]   06/28/2024   The VBCI Quality Team Specialist reviewed this patient medical record for the purposes of chart review for care gap closure. The following were reviewed: chart review for care gap closure-diabetic eye exam, glycemic status assessment, and kidney health evaluation for diabetes:eGFR  and uACR.    VBCI Quality Team

## 2024-07-12 ENCOUNTER — Ambulatory Visit: Admitting: Endocrinology

## 2024-07-20 ENCOUNTER — Encounter: Payer: Self-pay | Admitting: Pharmacist

## 2024-07-20 NOTE — Progress Notes (Signed)
 Pharmacy Quality Measure Review  This patient is appearing on a report for being at risk of failing the Glycemic Status Assessment in Diabetes and Kidney Health Evaluation for Patients with Diabetes this calendar year.    Last documented UACR = None Last GFR = 2025 Lab Results  Component Value Date   GFR 72.48 12/03/2023  does not meet criteria for measure closure.  Last documented A1c Lab Results  Component Value Date   HGBA1C 10.0 (H) 05/13/2024  does not meet criteria for measure closure.  2025 f/u scheduled: NO (no show 05/23/24, never re-scheduled) (No show to all appointments the past several months) Message sent to Scheduling pool for reschedule with Carrol Aurora   Future Appointments  Date Time Provider Department Center  12/21/2024  3:00 PM [facility] ANNUAL WELLNESS VISIT 1 [facility] [address]

## 2024-08-16 NOTE — Progress Notes (Signed)
 Cheryl Mercer                                          MRN: [id number]   08/16/2024   The VBCI Quality Team Specialist reviewed this patient medical record for the purposes of chart review for care gap closure. The following were reviewed: chart review for care gap closure-diabetic eye exam, glycemic status assessment, and kidney health evaluation for diabetes:eGFR  and uACR.    VBCI Quality Team

## 2024-08-29 NOTE — Progress Notes (Signed)
 Cheryl Mercer                                          MRN: [id number]   08/29/2024   The VBCI Quality Team Specialist reviewed this patient medical record for the purposes of chart review for care gap closure. The following were reviewed: chart review for care gap closure-glycemic status assessment.    VBCI Quality Team

## 2024-09-05 DIAGNOSIS — Z5321 Procedure and treatment not carried out due to patient leaving prior to being seen by health care provider: Secondary | ICD-10-CM | POA: Insufficient documentation

## 2024-09-05 DIAGNOSIS — R102 Pelvic and perineal pain unspecified side: Secondary | ICD-10-CM | POA: Diagnosis present

## 2024-09-06 ENCOUNTER — Emergency Department (HOSPITAL_COMMUNITY)
Admission: EM | Admit: 2024-09-06 | Discharge: 2024-09-06 | Attending: Emergency Medicine | Admitting: Emergency Medicine

## 2024-09-06 NOTE — ED Notes (Signed)
 Pt states that she went to park her car and was seen leaving ED waiting room. Pt never came back.

## 2024-12-21 ENCOUNTER — Ambulatory Visit
